# Patient Record
Sex: Female | Born: 1935
Health system: Southern US, Community
[De-identification: ages and names within clinical notes are randomized; demographics above are authoritative.]

## PROBLEM LIST (undated history)

## (undated) DIAGNOSIS — C4492 Squamous cell carcinoma of skin, unspecified: Secondary | ICD-10-CM

## (undated) DIAGNOSIS — E785 Hyperlipidemia, unspecified: Secondary | ICD-10-CM

## (undated) DIAGNOSIS — K219 Gastro-esophageal reflux disease without esophagitis: Secondary | ICD-10-CM

## (undated) DIAGNOSIS — K5010 Crohn's disease of large intestine without complications: Secondary | ICD-10-CM

## (undated) DIAGNOSIS — M1990 Unspecified osteoarthritis, unspecified site: Secondary | ICD-10-CM

## (undated) DIAGNOSIS — I10 Essential (primary) hypertension: Secondary | ICD-10-CM

## (undated) DIAGNOSIS — I779 Disorder of arteries and arterioles, unspecified: Secondary | ICD-10-CM

## (undated) DIAGNOSIS — C4491 Basal cell carcinoma of skin, unspecified: Secondary | ICD-10-CM

## (undated) DIAGNOSIS — F419 Anxiety disorder, unspecified: Secondary | ICD-10-CM

## (undated) DIAGNOSIS — H269 Unspecified cataract: Secondary | ICD-10-CM

## (undated) DIAGNOSIS — F329 Major depressive disorder, single episode, unspecified: Secondary | ICD-10-CM

## (undated) DIAGNOSIS — F32A Depression, unspecified: Secondary | ICD-10-CM

## (undated) DIAGNOSIS — E871 Hypo-osmolality and hyponatremia: Secondary | ICD-10-CM

## (undated) DIAGNOSIS — I639 Cerebral infarction, unspecified: Secondary | ICD-10-CM

## (undated) DIAGNOSIS — I739 Peripheral vascular disease, unspecified: Secondary | ICD-10-CM

## (undated) DIAGNOSIS — Z8673 Personal history of transient ischemic attack (TIA), and cerebral infarction without residual deficits: Secondary | ICD-10-CM

## (undated) HISTORY — DX: Crohn's disease of large intestine without complications: K50.10

## (undated) HISTORY — DX: Personal history of transient ischemic attack (TIA), and cerebral infarction without residual deficits: Z86.73

## (undated) HISTORY — DX: Disorder of arteries and arterioles, unspecified: I77.9

## (undated) HISTORY — PX: TUBAL LIGATION: SHX77

## (undated) HISTORY — PX: LOOP RECORDER IMPLANT: SHX5954

## (undated) HISTORY — DX: Hyperlipidemia, unspecified: E78.5

## (undated) HISTORY — DX: Essential (primary) hypertension: I10

## (undated) HISTORY — DX: Gastro-esophageal reflux disease without esophagitis: K21.9

## (undated) HISTORY — DX: Anxiety disorder, unspecified: F41.9

## (undated) HISTORY — DX: Major depressive disorder, single episode, unspecified: F32.9

## (undated) HISTORY — DX: Cerebral infarction, unspecified: I63.9

## (undated) HISTORY — PX: ABDOMINAL HYSTERECTOMY: SHX81

## (undated) HISTORY — DX: Unspecified osteoarthritis, unspecified site: M19.90

## (undated) HISTORY — DX: Depression, unspecified: F32.A

## (undated) HISTORY — DX: Hypo-osmolality and hyponatremia: E87.1

## (undated) HISTORY — DX: Peripheral vascular disease, unspecified: I73.9

## (undated) HISTORY — DX: Unspecified cataract: H26.9

---
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
## 1982-11-04 HISTORY — PX: VESICOVAGINAL FISTULA CLOSURE W/ TAH: SUR271

## 1989-07-05 HISTORY — PX: COLON RESECTION: SHX5231

## 1992-11-04 HISTORY — PX: BACK SURGERY: SHX140

## 2005-06-11 ENCOUNTER — Other Ambulatory Visit: Admission: RE | Admit: 2005-06-11 | Discharge: 2005-06-11 | Payer: Self-pay | Admitting: Family Medicine

## 2007-04-09 ENCOUNTER — Ambulatory Visit: Payer: Self-pay | Admitting: Cardiovascular Disease

## 2007-04-09 LAB — CONVERTED CEMR LAB
BUN: 9 mg/dL (ref 6–23)
CO2: 28 meq/L (ref 19–32)
Calcium: 9.7 mg/dL (ref 8.4–10.5)
Chloride: 99 meq/L (ref 96–112)
Creatinine, Ser: 0.9 mg/dL (ref 0.4–1.2)
GFR calc Af Amer: 79 mL/min
GFR calc non Af Amer: 66 mL/min
Glucose, Bld: 81 mg/dL (ref 70–99)
Potassium: 4.3 meq/L (ref 3.5–5.1)
Sodium: 136 meq/L (ref 135–145)

## 2007-04-21 ENCOUNTER — Ambulatory Visit: Payer: Self-pay

## 2007-04-24 ENCOUNTER — Ambulatory Visit (HOSPITAL_COMMUNITY): Admission: RE | Admit: 2007-04-24 | Discharge: 2007-04-24 | Payer: Self-pay | Admitting: Cardiovascular Disease

## 2007-04-24 ENCOUNTER — Encounter: Payer: Self-pay | Admitting: Cardiovascular Disease

## 2007-04-24 ENCOUNTER — Ambulatory Visit: Payer: Self-pay

## 2007-04-27 ENCOUNTER — Ambulatory Visit: Payer: Self-pay | Admitting: Cardiovascular Disease

## 2008-01-22 ENCOUNTER — Ambulatory Visit: Payer: Self-pay | Admitting: Cardiovascular Disease

## 2008-04-28 ENCOUNTER — Ambulatory Visit: Payer: Self-pay

## 2008-08-16 ENCOUNTER — Ambulatory Visit: Payer: Self-pay | Admitting: Cardiovascular Disease

## 2009-02-01 DIAGNOSIS — Z794 Long term (current) use of insulin: Secondary | ICD-10-CM | POA: Insufficient documentation

## 2009-02-01 DIAGNOSIS — E119 Type 2 diabetes mellitus without complications: Secondary | ICD-10-CM | POA: Insufficient documentation

## 2009-02-01 DIAGNOSIS — E785 Hyperlipidemia, unspecified: Secondary | ICD-10-CM | POA: Insufficient documentation

## 2009-02-01 DIAGNOSIS — F32A Depression, unspecified: Secondary | ICD-10-CM | POA: Insufficient documentation

## 2009-02-01 DIAGNOSIS — I10 Essential (primary) hypertension: Secondary | ICD-10-CM | POA: Insufficient documentation

## 2009-02-01 DIAGNOSIS — F329 Major depressive disorder, single episode, unspecified: Secondary | ICD-10-CM | POA: Insufficient documentation

## 2009-02-01 DIAGNOSIS — Z8679 Personal history of other diseases of the circulatory system: Secondary | ICD-10-CM | POA: Insufficient documentation

## 2009-02-01 DIAGNOSIS — M1990 Unspecified osteoarthritis, unspecified site: Secondary | ICD-10-CM | POA: Insufficient documentation

## 2009-02-01 DIAGNOSIS — E782 Mixed hyperlipidemia: Secondary | ICD-10-CM | POA: Insufficient documentation

## 2009-02-01 DIAGNOSIS — E1142 Type 2 diabetes mellitus with diabetic polyneuropathy: Secondary | ICD-10-CM | POA: Insufficient documentation

## 2009-02-01 DIAGNOSIS — E1169 Type 2 diabetes mellitus with other specified complication: Secondary | ICD-10-CM | POA: Insufficient documentation

## 2009-02-07 ENCOUNTER — Ambulatory Visit: Payer: Self-pay

## 2009-02-07 ENCOUNTER — Encounter: Payer: Self-pay | Admitting: Cardiovascular Disease

## 2009-02-07 ENCOUNTER — Ambulatory Visit: Payer: Self-pay | Admitting: Cardiovascular Disease

## 2009-02-07 DIAGNOSIS — R42 Dizziness and giddiness: Secondary | ICD-10-CM | POA: Insufficient documentation

## 2009-02-09 LAB — CONVERTED CEMR LAB
Basophils Absolute: 0 10*3/uL (ref 0.0–0.1)
Basophils Relative: 0 % (ref 0.0–3.0)
Eosinophils Absolute: 0.3 10*3/uL (ref 0.0–0.7)
Eosinophils Relative: 2.9 % (ref 0.0–5.0)
HCT: 36.5 % (ref 36.0–46.0)
Hemoglobin: 12.4 g/dL (ref 12.0–15.0)
Lymphocytes Relative: 12 % (ref 12.0–46.0)
Lymphs Abs: 1.1 10*3/uL (ref 0.7–4.0)
MCHC: 34 g/dL (ref 30.0–36.0)
MCV: 88.4 fL (ref 78.0–100.0)
Monocytes Absolute: 0.5 10*3/uL (ref 0.1–1.0)
Monocytes Relative: 5.1 % (ref 3.0–12.0)
Neutro Abs: 7.2 10*3/uL (ref 1.4–7.7)
Neutrophils Relative %: 80 % — ABNORMAL HIGH (ref 43.0–77.0)
Platelets: 175 10*3/uL (ref 150.0–400.0)
RBC: 4.13 M/uL (ref 3.87–5.11)
RDW: 12.2 % (ref 11.5–14.6)
TSH: 1.17 microintl units/mL (ref 0.35–5.50)
WBC: 9.1 10*3/uL (ref 4.5–10.5)

## 2009-05-01 ENCOUNTER — Ambulatory Visit: Payer: Self-pay

## 2009-05-01 ENCOUNTER — Encounter: Payer: Self-pay | Admitting: Cardiovascular Disease

## 2009-09-05 ENCOUNTER — Ambulatory Visit: Payer: Self-pay | Admitting: Cardiovascular Disease

## 2009-09-05 DIAGNOSIS — R079 Chest pain, unspecified: Secondary | ICD-10-CM

## 2009-09-05 DIAGNOSIS — S298XXA Other specified injuries of thorax, initial encounter: Secondary | ICD-10-CM

## 2009-09-11 ENCOUNTER — Telehealth (INDEPENDENT_AMBULATORY_CARE_PROVIDER_SITE_OTHER): Payer: Self-pay

## 2009-09-13 ENCOUNTER — Ambulatory Visit: Payer: Self-pay

## 2009-09-13 ENCOUNTER — Ambulatory Visit: Payer: Self-pay | Admitting: Internal Medicine

## 2009-09-13 ENCOUNTER — Encounter (HOSPITAL_COMMUNITY): Admission: RE | Admit: 2009-09-13 | Discharge: 2009-11-02 | Payer: Self-pay | Admitting: Cardiovascular Disease

## 2010-03-13 ENCOUNTER — Ambulatory Visit: Payer: Self-pay | Admitting: Cardiovascular Disease

## 2010-03-26 DIAGNOSIS — I6930 Unspecified sequelae of cerebral infarction: Secondary | ICD-10-CM

## 2010-03-26 DIAGNOSIS — Z8673 Personal history of transient ischemic attack (TIA), and cerebral infarction without residual deficits: Secondary | ICD-10-CM | POA: Insufficient documentation

## 2010-03-26 HISTORY — DX: Unspecified sequelae of cerebral infarction: I69.30

## 2010-04-30 ENCOUNTER — Encounter: Payer: Self-pay | Admitting: Cardiovascular Disease

## 2010-05-01 ENCOUNTER — Ambulatory Visit: Payer: Self-pay

## 2010-05-01 ENCOUNTER — Encounter: Payer: Self-pay | Admitting: Cardiovascular Disease

## 2010-09-25 ENCOUNTER — Ambulatory Visit: Payer: Self-pay | Admitting: Cardiovascular Disease

## 2010-11-09 ENCOUNTER — Inpatient Hospital Stay (HOSPITAL_COMMUNITY)
Admission: EM | Admit: 2010-11-09 | Discharge: 2010-11-13 | Payer: Self-pay | Source: Home / Self Care | Attending: Internal Medicine | Admitting: Internal Medicine

## 2010-11-10 ENCOUNTER — Encounter (INDEPENDENT_AMBULATORY_CARE_PROVIDER_SITE_OTHER): Payer: Self-pay | Admitting: Internal Medicine

## 2010-11-12 ENCOUNTER — Encounter (INDEPENDENT_AMBULATORY_CARE_PROVIDER_SITE_OTHER): Payer: Self-pay | Admitting: Internal Medicine

## 2010-11-12 ENCOUNTER — Encounter (INDEPENDENT_AMBULATORY_CARE_PROVIDER_SITE_OTHER): Payer: Self-pay | Admitting: Family Medicine

## 2010-11-19 LAB — COMPREHENSIVE METABOLIC PANEL
ALT: 12 U/L (ref 0–35)
AST: 20 U/L (ref 0–37)
Albumin: 3.9 g/dL (ref 3.5–5.2)
Alkaline Phosphatase: 54 U/L (ref 39–117)
BUN: 5 mg/dL — ABNORMAL LOW (ref 6–23)
CO2: 26 mEq/L (ref 19–32)
Calcium: 8.8 mg/dL (ref 8.4–10.5)
Chloride: 103 mEq/L (ref 96–112)
Creatinine, Ser: 0.71 mg/dL (ref 0.4–1.2)
GFR calc Af Amer: >60 mL/min (ref >60)
GFR calc non Af Amer: >60 mL/min (ref >60)
Glucose, Bld: 147 mg/dL — ABNORMAL HIGH (ref 70–99)
Potassium: 3.7 mEq/L (ref 3.5–5.1)
Sodium: 140 mEq/L (ref 135–145)
Total Bilirubin: 0.3 mg/dL (ref 0.3–1.2)
Total Protein: 6.9 g/dL (ref 6.0–8.3)

## 2010-11-19 LAB — GLUCOSE, CAPILLARY
Glucose-Capillary: 142 mg/dL — ABNORMAL HIGH (ref 70–99)
Glucose-Capillary: 150 mg/dL — ABNORMAL HIGH (ref 70–99)
Glucose-Capillary: 147 mg/dL — ABNORMAL HIGH (ref 70–99)
Glucose-Capillary: 146 mg/dL — ABNORMAL HIGH (ref 70–99)
Glucose-Capillary: 160 mg/dL — ABNORMAL HIGH (ref 70–99)
Glucose-Capillary: 170 mg/dL — ABNORMAL HIGH (ref 70–99)
Glucose-Capillary: 309 mg/dL — ABNORMAL HIGH (ref 70–99)
Glucose-Capillary: 217 mg/dL — ABNORMAL HIGH (ref 70–99)
Glucose-Capillary: 162 mg/dL — ABNORMAL HIGH (ref 70–99)
Glucose-Capillary: 154 mg/dL — ABNORMAL HIGH (ref 70–99)
Glucose-Capillary: 120 mg/dL — ABNORMAL HIGH (ref 70–99)
Glucose-Capillary: 209 mg/dL — ABNORMAL HIGH (ref 70–99)
Glucose-Capillary: 152 mg/dL — ABNORMAL HIGH (ref 70–99)
Glucose-Capillary: 125 mg/dL — ABNORMAL HIGH (ref 70–99)
Glucose-Capillary: 232 mg/dL — ABNORMAL HIGH (ref 70–99)
Glucose-Capillary: 153 mg/dL — ABNORMAL HIGH (ref 70–99)

## 2010-11-19 LAB — CARDIAC PANEL(CRET KIN+CKTOT+MB+TROPI)
CK, MB: 1.8 ng/mL (ref 0.3–4.0)
CK, MB: 1.7 ng/mL (ref 0.3–4.0)
Relative Index: INVALID (ref 0.0–2.5)
Relative Index: 1.7 (ref 0.0–2.5)
Total CK: 69 U/L (ref 7–177)
Total CK: 102 U/L (ref 7–177)
Troponin I: 0.02 ng/mL (ref 0.00–0.06)
Troponin I: 0.02 ng/mL (ref 0.00–0.06)

## 2010-11-19 LAB — TSH: TSH: 2.031 u[IU]/mL (ref 0.350–4.500)

## 2010-11-19 LAB — CBC
HCT: 34.6 % — ABNORMAL LOW (ref 36.0–46.0)
Hemoglobin: 11 g/dL — ABNORMAL LOW (ref 12.0–15.0)
MCH: 27.7 pg (ref 26.0–34.0)
MCHC: 31.8 g/dL (ref 30.0–36.0)
MCV: 87.2 fL (ref 78.0–100.0)
Platelets: 202 10*3/uL (ref 150–400)
RBC: 3.97 MIL/uL (ref 3.87–5.11)
RDW: 13.3 % (ref 11.5–15.5)
WBC: 7.2 10*3/uL (ref 4.0–10.5)

## 2010-11-19 LAB — POCT CARDIAC MARKERS
CKMB, poc: 1.3 ng/mL (ref 1.0–8.0)
Myoglobin, poc: 282 ng/mL (ref 12–200)
Troponin i, poc: <0.05 ng/mL (ref 0.00–0.09)

## 2010-11-19 LAB — HEMOGLOBIN A1C
Hgb A1c MFr Bld: 7.4 % — ABNORMAL HIGH (ref <5.7)
Mean Plasma Glucose: 166 mg/dL — ABNORMAL HIGH (ref <117)

## 2010-11-19 LAB — PROTIME-INR
INR: 1.14 (ref 0.00–1.49)
Prothrombin Time: 14.8 seconds (ref 11.6–15.2)

## 2010-11-19 LAB — LIPID PANEL
Cholesterol: 110 mg/dL (ref 0–200)
HDL: 37 mg/dL — ABNORMAL LOW (ref >39)
LDL Cholesterol: 48 mg/dL (ref 0–99)
Total CHOL/HDL Ratio: 3 RATIO
Triglycerides: 127 mg/dL (ref <150)
VLDL: 25 mg/dL (ref 0–40)

## 2010-11-19 LAB — APTT: aPTT: 27 seconds (ref 24–37)

## 2010-11-19 LAB — URINALYSIS, ROUTINE W REFLEX MICROSCOPIC
Bilirubin Urine: NEGATIVE
Hgb urine dipstick: NEGATIVE
Ketones, ur: NEGATIVE mg/dL
Nitrite: NEGATIVE
Protein, ur: NEGATIVE mg/dL
Specific Gravity, Urine: 1.002 — ABNORMAL LOW (ref 1.005–1.030)
Urine Glucose, Fasting: NEGATIVE mg/dL
Urobilinogen, UA: 0.2 mg/dL (ref 0.0–1.0)
pH: 7 (ref 5.0–8.0)

## 2010-11-19 LAB — CK TOTAL AND CKMB (NOT AT ARMC)
CK, MB: 2.3 ng/mL (ref 0.3–4.0)
Relative Index: INVALID (ref 0.0–2.5)
Total CK: 89 U/L (ref 7–177)

## 2010-11-19 LAB — FOLATE: Folate: >20 ng/mL

## 2010-11-19 LAB — TROPONIN I: Troponin I: 0.02 ng/mL (ref 0.00–0.06)

## 2010-11-19 LAB — VITAMIN B12: Vitamin B-12: 133 pg/mL — ABNORMAL LOW (ref 211–911)

## 2010-12-04 NOTE — Miscellaneous (Signed)
Summary: Orders Update  Clinical Lists Changes  Orders: Added new Test order of Carotid Duplex (Carotid Duplex) - Signed 

## 2010-12-04 NOTE — Assessment & Plan Note (Signed)
Summary: 6 month rov   Visit Type:  Follow-up Primary Provider:  Redge Gainer, MD  CC:  edema in ankles.  History of Present Illness: This is a 77 year old woman with history of hypertension, type 2 diabetes, dyslipidemia, and TIA. She presents today for followup evaluation. The patient had a negative Myoview stress test in 2010. She feels generalized fatigue and complains of feeling 'give out.' Denies CP, dyspnea, edema, or palps. Also complains of ankle swelling. No other complaints today.  Current Medications (verified): 1)  One-A-Day Womens  Tabs (Multiple Vitamins-Calcium) .... Sometimes 2)  Metformin Hcl 1000 Mg Tabs (Metformin Hcl) .... Take 1 Tablet By Mouth Two Times A Day 3)  Aggrenox 25-200 Mg Xr12h-Cap (Aspirin-Dipyridamole) .... Take 1 Capsule By Mouth Two Times A Day 4)  Lantus 100 Unit/ml Soln (Insulin Glargine) .... As Directed 5)  Simvastatin 40 Mg Tabs (Simvastatin) .... Take One Tablet By Mouth Daily At Bedtime 6)  Diovan Hct 160-25 Mg Tabs (Valsartan-Hydrochlorothiazide) .... Take 1 Tablet By Mouth Once A Day 7)  Lorazepam 0.5 Mg Tabs (Lorazepam) .... Take One in The Eve. 8)  Metoprolol Succinate 100 Mg Xr24h-Tab (Metoprolol Succinate) .... Take One Tablet By Mouth Daily 9)  Amlodipine Besylate 10 Mg Tabs (Amlodipine Besylate) .... Take One Tablet By Mouth Daily 10)  Nexium 40 Mg Cpdr (Esomeprazole Magnesium) .... Take 1 Capsule By Mouth Once A Day 11)  Omega-3 Fish Oil 1000 Mg Caps (Omega-3 Fatty Acids) .... Take 1 Capsule By Mouth Once A Day 12)  Magnesium 250 Mg Tabs (Magnesium) .... Take 1 Tablet By Mouth Once A Day 13)  Aspirin Ec 325 Mg Tbec (Aspirin) .... As Needed 14)  Vitamin D 400 Unit Tabs (Cholecalciferol) .... Take One Daily 15)  B Complex-B12  Tabs (B Complex Vitamins) .... Take One Daily  Allergies: 1)  ! Codeine 2)  ! Sulfa 3)  ! * Niaspan 4)  ! Morphine 5)  ! * Latex 6)  ! * Shellfish  Past History:  Past medical history reviewed for  relevance to current acute and chronic problems.  Past Medical History: Reviewed history from 02/01/2009 and no changes required. HYPERTENSION (ICD-401.9) TRANSIENT ISCHEMIC ATTACKS, HX OF (ICD-V12.50) DYSLIPIDEMIA (ICD-272.4) CVA (ICD-434.91) DIABETES MELLITUS, TYPE II, HX OF (ICD-V12.2) DEPRESSION (ICD-311) OSTEOARTHRITIS (ICD-715.90)  Review of Systems       Negative except as per HPI   Vital Signs:  Patient profile:   77 year old female Height:      61 inches Weight:      121 pounds Pulse rate:   72 / minute Pulse rhythm:   regular Resp:     16 per minute BP sitting:   148 / 52  (left arm)  Vitals Entered By: Talbert Nan, CMA (Mar 13, 2010 2:22 PM)  Physical Exam  General:  Pt is alert and oriented, in no acute distress. HEENT: normal Neck: normal carotid upstrokes without bruits, JVP normal Lungs: CTA CV: RRR without murmur or gallop Abd: soft, NT, positive BS, no bruit, no organomegaly Ext: no clubbing, cyanosis, or edema. peripheral pulses 2+ and equal Skin: warm and dry without rash    EKG  Procedure date:  03/13/2010  Findings:      NSR, lateral ST abnormality unchanged compared with old tracing, otherwise within normal limits.  Impression & Recommendations:  Problem # 1:  HYPERTENSION (ICD-401.9) BP elevated in this pt with systolic hypertension. Difficult to titrate up her antihypertensives as she already feels dizzy and fatigued. Will continue  current regimen for now with follow-up BP monitoring at home.  Her updated medication list for this problem includes:    Diovan Hct 160-25 Mg Tabs (Valsartan-hydrochlorothiazide) .Marland Kitchen... Take 1 tablet by mouth once a day    Metoprolol Succinate 100 Mg Xr24h-tab (Metoprolol succinate) .Marland Kitchen... Take one tablet by mouth daily    Amlodipine Besylate 10 Mg Tabs (Amlodipine besylate) .Marland Kitchen... Take one tablet by mouth daily    Aspirin Ec 325 Mg Tbec (Aspirin) .Marland Kitchen... As needed  Orders: EKG w/ Interpretation  (93000)  BP today: 148/52 Prior BP: 154/64 (09/05/2009)  Labs Reviewed: K+: 4.3 (04/09/2007) Creat: : 0.9 (04/09/2007)     Problem # 2:  DYSLIPIDEMIA (ICD-272.4) Followed by her primary card physician. No CAD but does have history of TIA. LDL goal is less than 100 mg/dL.  Her updated medication list for this problem includes:    Simvastatin 40 Mg Tabs (Simvastatin) .Marland Kitchen... Take one tablet by mouth daily at bedtime  Problem # 3:  OCCLUSION&STENOSIS CAROTID ARTERY W/INFARCT (ICD-433.11) Pt with 40-59% bilateral carotid stenosis by ultrasound June 2010. Will f/u with a repeat duplex scan at the time of her f/u visit in 6 months.  Her updated medication list for this problem includes:    Aggrenox 25-200 Mg Xr12h-cap (Aspirin-dipyridamole) .Marland Kitchen... Take 1 capsule by mouth two times a day    Aspirin Ec 325 Mg Tbec (Aspirin) .Marland Kitchen... As needed  Patient Instructions: 1)  Your physician wants you to follow-up in:   6 MONTHS. You will receive a reminder letter in the mail two months in advance. If you don't receive a letter, please call our office to schedule the follow-up appointment. 2)  Your physician has requested that you have a carotid duplex in 6  3)  MONTHS. This test is an ultrasound of the carotid arteries in your neck. It looks at blood flow through these arteries that supply the brain with blood. Allow one hour for this exam. There are no restrictions or special instructions. 4)  Your physician recommends that you continue on your current medications as directed. Please refer to the Current Medication list given to you today.

## 2010-12-04 NOTE — Assessment & Plan Note (Signed)
Summary: f61m  Visit Type:  6 months follow up Primary Provider:  DRedge Gainer MD  CC:  Tiredness-weakness.  History of Present Illness: This is a 77year old woman with history of hypertension, type 2 diabetes, dyslipidemia, and TIA. She presents today for followup evaluation. The patient had a negative Myoview stress test in 2010.   This past weekend she complained of right and left arm pain radiating to the shoulders. This awakened her from sleep. She took a 'nerve pill' and went back to sleep, waking up in the morning without problems.   She complains of generalized fatigue, but otherwise has no specific cardiac complaints. Denies exertional chest pain or dyspnea. Occasional lightheadedness without syncope.   Current Medications (verified): 1)  One-A-Day Womens  Tabs (Multiple Vitamins-Calcium) .... Sometimes 2)  Metformin Hcl 1000 Mg Tabs (Metformin Hcl) .... Take 1 Tablet By Mouth Two Times A Day 3)  Aggrenox 25-200 Mg Xr12h-Cap (Aspirin-Dipyridamole) .... Take 1 Capsule By Mouth Two Times A Day 4)  Lantus 100 Unit/ml Soln (Insulin Glargine) .... As Directed 5)  Diovan 320 Mg Tabs (Valsartan) .... Take 1 Tablet By Mouth Once A Day 6)  Lorazepam 0.5 Mg Tabs (Lorazepam) .... Take One in The Eve. 7)  Metoprolol Succinate 100 Mg Xr24h-Tab (Metoprolol Succinate) .... Take One Tablet By Mouth Daily 8)  Amlodipine Besylate 5 Mg Tabs (Amlodipine Besylate) .... Take One Tablet By Mouth Daily 9)  Nexium 40 Mg Cpdr (Esomeprazole Magnesium) .... Take 1 Capsule By Mouth Once A Day 10)  Omega-3 Fish Oil 1000 Mg Caps (Omega-3 Fatty Acids) .... Take 1 Capsule By Mouth Once A Day 11)  Magnesium 250 Mg Tabs (Magnesium) .... Take 1 Tablet By Mouth Once A Day 12)  Aspirin Ec 325 Mg Tbec (Aspirin) .... As Needed 13)  Vitamin D 400 Unit Tabs (Cholecalciferol) .... Take One Daily 14)  B Complex-B12  Tabs (B Complex Vitamins) .... Take One Daily 15)  Lexapro 10 Mg Tabs (Escitalopram Oxalate) .... Take 1  Tablet By Mouth Once A Day  Allergies: 1)  ! Codeine 2)  ! Sulfa 3)  ! * Niaspan 4)  ! Morphine 5)  ! * Latex 6)  ! * Shellfish  Past History:  Past medical history reviewed for relevance to current acute and chronic problems.  Past Medical History: Reviewed history from 02/01/2009 and no changes required. HYPERTENSION (ICD-401.9) TRANSIENT ISCHEMIC ATTACKS, HX OF (ICD-V12.50) DYSLIPIDEMIA (ICD-272.4) CVA (ICD-434.91) DIABETES MELLITUS, TYPE II, HX OF (ICD-V12.2) DEPRESSION (ICD-311) OSTEOARTHRITIS (ICD-715.90)  Review of Systems       Negative except as per HPI   Vital Signs:  Patient profile:   77year old female Height:      61 inches Weight:      122.25 pounds BMI:     23.18 Pulse rate:   65 / minute Pulse rhythm:   regular Resp:     18 per minute BP sitting:   155 / 60  (left arm) Cuff size:   large  Vitals Entered By: LSidney Ace(September 25, 2010 1:41 PM)  Physical Exam  General:  Pt is alert and oriented, elderly woman in no acute distress. HEENT: normal Neck: normal carotid upstrokes without bruits, JVP normal Lungs: CTA CV: RRR without murmur or gallop Abd: soft, NT, positive BS, no bruit, no organomegaly Ext: no clubbing, cyanosis, or edema. peripheral pulses 2+ and equal Skin: warm and dry without rash    EKG  Procedure date:  09/25/2010  Findings:  NSR 65 bpm, nonspecific ST abnormality.  Impression & Recommendations:  Problem # 1:  OCCLUSION&STENOSIS CAROTID ARTERY W/INFARCT (ICD-433.11) Pt is due for a carotid duplex in June. Will see her back at that time for followup. Last study reviewed and showed 40-59% bilateral ICA stenosis.  Her updated medication list for this problem includes:    Aggrenox 25-200 Mg Xr12h-cap (Aspirin-dipyridamole) .Marland Kitchen... Take 1 capsule by mouth two times a day    Aspirin Ec 325 Mg Tbec (Aspirin) .Marland Kitchen... As needed  Problem # 2:  HYPERTENSION (ICD-401.9) Subuptimal control. We reviewed home BP's which  are variable but typically lower than office reading today. I asker her to record BP's for further management. She had edema on higher doses of amlodipine.  Her updated medication list for this problem includes:    Diovan 320 Mg Tabs (Valsartan) .Marland Kitchen... Take 1 tablet by mouth once a day    Metoprolol Succinate 100 Mg Xr24h-tab (Metoprolol succinate) .Marland Kitchen... Take one tablet by mouth daily    Amlodipine Besylate 5 Mg Tabs (Amlodipine besylate) .Marland Kitchen... Take one tablet by mouth daily    Aspirin Ec 325 Mg Tbec (Aspirin) .Marland Kitchen... As needed  BP today: 155/60 Prior BP: 148/52 (03/13/2010)  Labs Reviewed: K+: 4.3 (04/09/2007) Creat: : 0.9 (04/09/2007)     Problem # 3:  DYSLIPIDEMIA (ICD-272.4) Managed by PCP. Goal LDL less than 100 and ideally less than 70.  The following medications were removed from the medication list:    Simvastatin 40 Mg Tabs (Simvastatin) .Marland Kitchen... Take one tablet by mouth daily at bedtime  Other Orders: EKG w/ Interpretation (93000)  Patient Instructions: 1)  Your physician wants you to follow-up in: JUNE 2012.  You will receive a reminder letter in the mail two months in advance. If you don't receive a letter, please call our office to schedule the follow-up appointment. 2)  Your physician has requested that you have a carotid duplex in JUNE 2012. This test is an ultrasound of the carotid arteries in your neck. It looks at blood flow through these arteries that supply the brain with blood. Allow one hour for this exam. There are no restrictions or special instructions. 3)  Your physician recommends that you continue on your current medications as directed. Please refer to the Current Medication list given to you today.

## 2011-03-19 NOTE — Assessment & Plan Note (Signed)
[facility] NOTE   NAME:Kara Dean                       MRN:          [id number]  DATE:01/22/2008                            DOB:          [birth year]    Kara Dean was seen in follow-up at th [facility] on  January 22, 2008.  Kara Dean is a delightful 77 year old woman with a  history of hypertension, diabetes, dyslipidemia, and TIA, who presents  today for followup.  She was initially seen last summer with chest pain  and underwent a dobutamine Myoview stress scan that was normal.  She  also had an echocardiogram that showed normal findings at that time.  She has done well over the past year, but approximately 2 weeks ago  experienced discomfort in her shoulders and arms that she describes as  an ache.  The symptoms lasted over a few days and were intermittent in  nature.  There were not related to exertion.  She was evaluated at  [facility] and had an EKG there that showed  nonspecific findings.  Her symptoms have subsequently resolved, and in  retrospect she attributes them to a flu bug.  However, she really did  not have much in the sense of systemic symptoms at the time of her arm  and shoulder discomfort.  Currently, she feels well and has no dyspnea,  orthopnea, PND, edema, or chest pain.   MEDICATIONS:  1. Prevacid 30 mg daily.  2. Once-a-day women's vitamin.  3. Diovan/HCT 320/12.5 mg daily.  4. Metformin 1 g twice daily.  5. Aggrenox 25/200 mg b.i.d.  6. Toprol XL 100 mg daily.  7. Lantus daily.  8. Norvasc 5 mg daily.  9. Simvastatin 40 mg at bedtime.  10.Glucotrol XL 5 mg daily.  11.Nasacort   ALLERGIES:  1. CODEINE.  2. SULFA.  3. NIASPAN.  4. MORPHINE.  5. LATEX.  6. SHELLFISH   PHYSICAL EXAMINATION:  GENERAL:  She is alert and oriented, in no  distress.  HEENT:  Normal.  NECK:  Normal carotid upstrokes, without bruits.  Jugular  venous  pressure is normal.  No thyromegaly or thyroid nodules.  LUNGS:  Clear bilaterally.  HEART:  Regular rate and rhythm, without murmurs or gallops.  ABDOMEN:  Soft, nontender.  No bruits.  EXTREMITIES:  No clubbing, cyanosis, or edema.  Peripheral pulses 2+ and  equal throughout.   EKG shows sinus bradycardia, with a nonspecific ST-segment change.   ASSESSMENT:  Chest pain.  While Kara Dean' symptoms have resolved, I  am concerned about at the fact that they may represent an anginal  equivalent.  She certainly has a risk profile that predisposes her to  CAD.  We discussed my recommendation to proceed with a cardiac  catheterization, but she is quite reluctant to do this, especially now  that she is symptom free over the last week.  I asked her to really keep  a close eye symptoms, and if she has any recurrent pain to seek  immediate medical attention.  If her symptoms are similar  to what she  has had a few weeks back, she could simply call our office and we would  arrange a prompt evaluation and cardiac catheterization.  In the  meantime, she will continue on her current medical therapy.  She is on a  good dose of beta blocker, and her heart rate precludes escalation of  her Toprol.  She has a component of white coat hypertension, and she  reports better blood pressure control at home, so I did not make any  other changes in her regimen.  She is on multiple antihypertensives  under the care of [facility].  She remains on Aggrenox for antiplatelet  therapy with her history of TIA.  I will plan on seeing Kara Dean back  in 3 months, and again I would be happy to see her sooner if she has  recurrence of her symptoms.     Kara Bond. Burt Knack, MD  Electronically Signed    MDC/MedQ  DD: 01/26/2008  DT: 01/26/2008  Job #: [id number]   cc:   Chipper Herb, M.D.

## 2011-03-19 NOTE — Letter (Signed)
April 09, 2007    Kara Eon, NP & Dr. Redge Gainer  [address], [place] [address]   RE:  Kara Dean, [facility]  MRN:  [id number]  /  DOB:  1933/04/21   Dear Butch Penny and Dr. Laurance Flatten:   It was my pleasure to see Kara Dean as an outpatient at the  [facility] Office on April 09, 2007. As you know, she is a 77-year-  old woman who presents with a chief complaint of chest pain.   HISTORY OF PRESENT ILLNESS:  Kara Dean was recently evaluated after an  episode of transient neurologic dysfunction. Approximately three weeks  ago, she had a 30-60 minute episode of aphasia and difficulty  remembering the names of her children, as well as difficulty with all  thought processes in general. This episode resolved spontaneously and  she did not have any workup at the time until she presented to your  office a few weeks later. She describes a similar episode that occurred  approximately five years ago, but has had no episodes since, nor has she  had any similar episodes in the past year. She did not have any focal  weakness or other focal neurologic signs at the time. She was presumed  to have had a TIA and was started on Aggrenox.   Her main complaint today is that of intermittent chest pain. She  describes the pain in the substernal chest area, as well as the  epigastrium that occurs both at rest and with exertion, but is more  pronounced with exertion. She has had these symptoms for the past few  months. The pain is non-radiating and feels like a pressure. She also  describes dyspnea on exertion when walking up hills, but this is a  stable symptom over several years. There are no other exacerbating  factors for her chest pain. She is not taking any medication or found  any alleviating factors other than stopping her activity. She has had  light-headed spells and episodic palpitations, but no other  cardiovascular complaints. She has not had syncope.   PAST MEDICAL HISTORY:   Is pertinent for the following:  1. Hypertension with suboptimal control.  2. Type-2 diabetes treated with oral hypoglycemics.  3. Dyslipidemia.  4. Back injury requiring surgery in 1994.  5. Hysterectomy in 1984.  6. Tubal ligation.  7. Osteoarthritis with pain in the back, knees, and feet.  8. Depression.   CURRENT MEDICATIONS:  1. Prevacid 30 mg daily.  2. Diovan/hydrochlorothiazide 320/12.5 mg daily.  3. Aspirin 81 mg daily.  4. Lorazepam 0.5 mg as needed.  5. Avandaryl 4/4 mg daily.  6. Simvastatin 40 mg daily.  7. Metformin 1000 mg twice daily.  8. Aggrenox 25/200 mg twice daily.  9. Toprol XL 100 mg daily.   ALLERGIES:  CODEINE, SULFA, NIASPAN, MORPHINE, LATEX, and SHELLFISH.   FAMILY HISTORY:  She has a brother who was diagnosed with coronary  artery disease at age 67 and a sister who was diagnosed at age 98. Other  family members include her mother who died of old age at 34, and her  father died of emphysema at 6.   SOCIAL HISTORY:  The patient is retired. She is widowed and has six  children. She does not smoke cigarettes, nor does she drink alcohol. She  works in her yard, but does not do any regular exercise.   REVIEW OF SYSTEMS:  A complete 12 point review of systems was performed,  and pertinent positives included calf pain with walking, dizziness,  light-headedness, seasonal allergies, fatigue, arthritis, anxiety, and  depression. All other systems are reviewed and are negative except as  detailed above.   PHYSICAL EXAMINATION:  GENERAL:  The patient is alert and oriented. She  is no acute distress. She is well developed and well nourished.  VITAL SIGNS:  Blood pressure 170/70 in the right arm, 170/74 in the left  arm, heart rate 65, respiratory rate 16.  HEENT:  Normal.  NECK:  Normal carotid upstrokes without bruits. Jugular venous pressure  is normal. There is no thyromegaly or thyroid nodules.  LUNGS:  Clear to auscultation bilaterally.   CARDIOVASCULAR:  The apex is discreet and nondisplaced. The heart is a  regular rate and rhythm without murmurs or gallops.  ABDOMEN:  Soft and nontender, no organomegaly, no abdominal bruits, no  masses.  BACK:  No flank tenderness.  EXTREMITIES:  No cyanosis, clubbing, or edema. Peripheral pulses are 2+  and equal throughout.  SKIN:  Warm and dry without rash.  NEUROLOGIC:  Cranial nerves II-XII are intact. Strength is 5/5 and equal  in the arms and legs bilaterally. The patient is alert and oriented to  all spheres. Cerebellar function test with finger to nose are normal.  Gait is normal.  LYMPHATICS:  No adenopathy.   EKG demonstrates normal sinus rhythm and is within normal limits.   ASSESSMENT:  Kara Dean is a 77 year old woman with the following  cardiovascular issues:  1. Chest pain. She has both typical and atypical features. She has      multiple risk factors including hypertension, type-2 diabetes, and      dyslipidemia. While she has a normal EKG and has pain that occurs      both with exertion and rest, I think she requires a stress Myoview      study. We will schedule this to rule out significant ischemic heart      disease. We will continue her current medications for now and we      will not change her antiplatelet regimen as she is on Aggrenox      which is appropriate in the setting of her recent TIA.  2. Recent neurologic event. Her event sounds very typical for a TIA.      She does not have any clear cut source, but certainly is at risk      for atheroembolic disease with her multiple risk factors. We will      check a 2D echocardiogram to rule out a cardiac source of embolus,      also she requires a carotid ultrasound to rule out significant      carotid stenosis, even though I do not hear a bruit on exam. Also,      we will check a CT of the brain to help elucidate whether she has     any chronic findings or subacute infarcts. Fortunately, she does      not  have any residual neurologic defects. She may require a formal      neurology consultation as well.  3. Hypertension with suboptimal control. I did not make any changes to      her medical regimen today, but she likely will require upward      titration of her medical therapy or for her hypertension.  4. Dyslipidemia. She is on Simvastatin 40 mg. Her goal LDL should      clearly be less than 100 and ideally  less than 70 in the setting of      her likely cerebral vascular disease.   For follow up, I would like to see Kara Dean back soon after her  studies are completed, and will review the need for any further studies  at that point.   Thanks again for allowing me to see Kara Dean who is a delightful  woman. Please feel free to call at anytime with questions regarding her  care.    Sincerely,      Juanda Bond. Burt Knack, MD  Electronically Signed    MDC/MedQ  DD: 04/09/2007  DT: 04/10/2007  Job #: [id number]

## 2011-03-19 NOTE — Assessment & Plan Note (Signed)
[place] OFFICE NOTE   NAME:Mcvicar, VINCIE LINN                       MRN:          [id number]  DATE:04/27/2007                            DOB:          1933-05-25    Ridhi Ronie Fleeger was seen in followup at the [facility] Office  on April 27, 2007. She is a delightful 77 year old woman who I initially  saw on June 5th in consultation for chest pain. Ms. Eckstein had a recent  TIA and has been started on Aggrenox. She also had chest pains in the  setting of multiple cardiac risk factors. I ordered several studies to  evaluate her chest pain, as well as her cerebral vascular disease.   From a symptomatic standpoint she denies any further neurologic events.  She really has been doing well without problems. Her chest pain has also  resolved. She denies dyspnea, orthopnea, PND. She has had some  intermittent palpitations but overall is doing quite well at this time.   CURRENT MEDICATIONS:  Include;  1. Prevacid 30 mg daily.  2. Multivitamin daily.  3. Diovan/hydrochlorothiazide 320/12.5 mg daily.  4. Aspirin 81 mg daily.  5. Lorazepam 0.5 mg as needed.  6. Avandaryl 4/4 mg daily.  7. Simvastatin 40 mg daily.  8. Metformin 1000 mg twice daily.  9. Aggrenox 25/200 mg twice daily.  10.Toprol XL 100 mg daily.  11.Lantus insulin daily.   ALLERGIES:  MULTIPLE AND INCLUDE CODEINE, SULFUR, NIASPAN, MORPHINE,  LATEX, AND SHELL FISH.   PHYSICAL EXAMINATION:  The patient is alert and oriented. She is in no  acute distress. Her weight is 130 pounds. Blood pressure is 190/80,  heart rate is 54, respiratory rate 16.  HEENT: Normal.  NECK: Normal carotid upstrokes without bruits. Jugular venosus pressure  is normal.  LUNGS: Clear to auscultation bilaterally.  CARDIOVASCULAR: The apex is discrete and nondisplaced.  HEART: Regular rate and rhythm without murmurs or gallops.  ABDOMEN: Soft, nontender. No  organomegaly.  EXTREMITIES: No clubbing, cyanosis, or edema. Peripheral pulses are 2+  and equal throughout.   STUDIES REVIEWED:  Dobutamine Myoview stress test was unremarkable with  an LVEF of 69% there was normal contractility and thickening in all  areas of the myocardium. The study was interpreted as low risk with  normal perfusion.   Transthoracic echocardiogram to evaluate for cardiac source of embolus  was also unremarkable. The overall LVEF by echo was 55% to 65%. There  were no regional wall motion abnormalities. There was mild mitral  regurgitation, PA pressure was mildly increased. There was no  significant valvular disease noted.   Carotid ultrasound demonstrated mild heterogenous plaque bilaterally  with 40% to 59% bilaterally internal carotid artery stenosis.   CT scan of the brain showed no acute intracranial findings. There was  chronic microvascular white matter disease.   ASSESSMENT:  Ms. Volland is currently stable from a cardiovascular and  cerebral vascular standpoint. Her chest pain syndrome has resolved. Her  Myoview stress study is reassuring. I do not think she requires any  further evaluation at this point.  Regarding her cerebral vascular she does have moderate bilaterally  carotid stenosis and will require yearly follow up with carotid Duplex  scanning. There was no obvious cardioembolic source of her event based  on a surface echocardiogram. She clearly has significant hypertension  and this is the likely etiology of her symptoms. I have taken the  liberty of starting her on Norvasc 5 mg daily for additive  antihypertensive therapy.   I appreciate the opportunity to participate in the care of Ms. Chrissie Noa.  I did not schedule a regular follow up today as she will continue to  follow up closely with Dr. Laurance Flatten and Olivia Mackie. I would be happy to  see her at any time in the future if any cardiovascular problems arise.     Juanda Bond. Burt Knack, MD   Electronically Signed    MDC/MedQ  DD: 04/27/2007  DT: 04/28/2007  Job #: [id number]   cc:   Chipper Herb, M.D.  Olivia Mackie

## 2011-03-19 NOTE — Assessment & Plan Note (Signed)
[place] OFFICE NOTE   NAME:Lapier, JOANY KHATIB                       MRN:          [id number]  DATE:08/16/2008                            DOB:          November 24, 1932    Kara Dean returns for followup at the [facility] office on  August 16, 2008.  She is a 77 year old woman with hypertension,  diabetes, dyslipidemia, and history of TIA.  She has been evaluated here  for chest pain.  She has undergone Myoview stress imaging that was  normal.  Since her last visit here in March, she has had no further  episodes of chest discomfort.  She denies dyspnea, edema, orthopnea, or  PND.  She is not engaged in regular exercise, but she denies some  exertional symptoms with her normal level of activity.  She has a past  history of palpitations, but this has not been a problem of late.   MEDICATIONS:  1. Prevacid 30 mg daily.  2. Multivitamin 1 daily.  3. Metformin 1 g twice daily.  4. Aggrenox 25/200 mg twice daily.  5. Toprol-XL 100 mg daily.  6. Lantus daily.  7. Norvasc 5 mg daily.  8. Simvastatin 40 mg at bedtime.  9. Nasacort 1 daily.  10.Diovan HCT 160/25 mg daily.   ALLERGIES:  Codeine, sulfa, Niaspan, morphine, latex, and shellfish.   PHYSICAL EXAMINATION:  GENERAL:  The patient is alert and oriented  elderly woman no acute distress.  VITAL SIGNS:  Weight is 121 pounds, blood pressure 180/78, heart rate  55, and respiratory rate 18.  HEENT:  Normal.  NECK:  Normal carotid upstrokes.  There are no carotid bruits.  JVP is  normal.  LUNGS:  Clear bilaterally.  HEART:  Regular rate and rhythm without murmurs or gallops.  ABDOMEN:  Soft, nontender.  No organomegaly.  EXTREMITIES:  No clubbing, cyanosis or edema.  Peripheral pulses are  intact and equal.   ASSESSMENT:  1. Essential hypertension with suboptimal control.  We should make      aggressive efforts at lowering her blood pressure in the  setting of      prior transient ischemic attack.  I have asked her to increase her      Norvasc to 10 mg daily.  She was resistant to starting a new      medication since she is already on 4 antihypertensive medicines.  I      would like to see her back in the office in 6 months.  I have asked      her to monitor her blood pressure at home and also followup with      Dr. Tawanna Sat office.  2. History of chest pain.  Myoview stress scan in 2008 was negative      for ischemia.  No further symptoms.  3. History of transient ischemic attack.  The patient remains on      Aggrenox.  She has had no further symptoms.   For followup, I would like to see the patient back in 6 months as above.  Juanda Bond. Burt Knack, MD  Electronically Signed    MDC/MedQ  DD: 08/16/2008  DT: 08/17/2008  Job #: [id number]   cc:   Chipper Herb, M.D.

## 2011-04-05 ENCOUNTER — Encounter: Payer: Self-pay | Admitting: Cardiovascular Disease

## 2011-04-22 ENCOUNTER — Ambulatory Visit (INDEPENDENT_AMBULATORY_CARE_PROVIDER_SITE_OTHER): Payer: PRIVATE HEALTH INSURANCE | Admitting: Cardiovascular Disease

## 2011-04-22 ENCOUNTER — Encounter: Payer: Self-pay | Admitting: Cardiovascular Disease

## 2011-04-22 VITALS — BP 138/80 | HR 64 | Ht 60.0 in | Wt 123.0 lb

## 2011-04-22 DIAGNOSIS — R079 Chest pain, unspecified: Secondary | ICD-10-CM

## 2011-04-22 DIAGNOSIS — I63239 Cerebral infarction due to unspecified occlusion or stenosis of unspecified carotid arteries: Secondary | ICD-10-CM

## 2011-04-22 DIAGNOSIS — I10 Essential (primary) hypertension: Secondary | ICD-10-CM

## 2011-04-22 NOTE — Assessment & Plan Note (Signed)
The patient has moderate carotid stenosis with no recent stroke or TIA symptoms. She is due for a followup carotid duplex next month.

## 2011-04-22 NOTE — Progress Notes (Signed)
HPI:  This is a 77 year old woman presenting for followup evaluation. She has a history of hypertension, type 2 diabetes, dyslipidemia, and TIA. The patient had an episode of chest pain about 2 weeks ago that she really is unable to characterize very well. The pain did not occur with exertion. It only lasted for a few minutes but she did intermittently has a difficult time remembering how long her symptoms lasted. She has had no problems since then. She had an episode of back and shoulder pain about 6 months ago but other than that episode and her recent symptoms she had no interval problems. She is inactive. She denies any stroke or TIA symptoms. She denies dyspnea, edema, outpatient, lightheadedness, or syncope.  Outpatient Encounter Prescriptions as of 04/22/2011  Medication Sig Dispense Refill  . amLODipine (NORVASC) 5 MG tablet Take 5 mg by mouth daily.        Marland Kitchen aspirin 325 MG EC tablet Take 325 mg by mouth as needed.        Marland Kitchen atorvastatin (LIPITOR) 10 MG tablet Take 10 mg by mouth daily.        Marland Kitchen dipyridamole-aspirin (AGGRENOX) 25-200 MG per 12 hr capsule Take 1 capsule by mouth 2 (two) times daily.        Marland Kitchen escitalopram (LEXAPRO) 10 MG tablet Take 10 mg by mouth daily.        Marland Kitchen esomeprazole (NEXIUM) 40 MG capsule Take 40 mg by mouth daily before breakfast.        . ferrous fumarate (FERRETTS) 325 (106 FE) MG TABS Take by mouth.        . insulin glargine (LANTUS) 100 UNIT/ML injection Inject into the skin as directed.        Marland Kitchen LORazepam (ATIVAN) 0.5 MG tablet Take 0.5 mg by mouth every evening.        . Magnesium 250 MG TABS Take 1 tablet by mouth daily.        . metFORMIN (GLUCOPHAGE) 1000 MG tablet Take 1,000 mg by mouth 2 (two) times daily.        . metoprolol (TOPROL-XL) 100 MG 24 hr tablet Take 100 mg by mouth daily.        Marland Kitchen omega-3 fish oil (MAXEPA) 1000 MG CAPS capsule Take 1 capsule by mouth daily.        . valsartan (DIOVAN) 320 MG tablet Take 320 mg by mouth daily.        . vitamin  D, CHOLECALCIFEROL, 400 UNITS tablet Take 400 Units by mouth daily.        . B Complex Vitamins (B COMPLEX-B12) TABS Take 1 tablet by mouth daily.          Allergies  Allergen Reactions  . Codeine   . Latex   . Morphine   . Niacin   . Sulfonamide Derivatives     Past Medical History  Diagnosis Date  . Hypertension   . History of TIAs   . Dyslipidemia   . CVA (cerebral infarction)   . Diabetes mellitus     type 2  . Depression   . Osteoarthritis     ROS: Negative except as per HPI  BP 138/80  Pulse 64  Ht 5' (1.524 m)  Wt 123 lb (55.792 kg)  BMI 24.02 kg/m2  PHYSICAL EXAM: Pt is alert and oriented, elderly woman in NAD HEENT: normal Neck: JVP - normal, carotids 2+= with soft bilateral bruits Lungs: CTA bilaterally CV: RRR without murmur or gallop Abd:  soft, NT, Positive BS, no hepatomegaly Ext: no C/C/E, distal pulses intact and equal Skin: warm/dry no rash  EKG:  Normal sinus rhythm 65 beats per minute, inferolateral ST segment abnormality unchanged from previous tracing  ASSESSMENT AND PLAN:

## 2011-04-22 NOTE — Patient Instructions (Signed)
Your physician wants you to follow-up in: 6 MONTHS.  You will receive a reminder letter in the mail two months in advance. If you don't receive a letter, please call our office to schedule the follow-up appointment.  Your physician recommends that you continue on your current medications as directed. Please refer to the Current Medication list given to you today.

## 2011-04-22 NOTE — Assessment & Plan Note (Signed)
The patient has had a single episode of chest pain, with features difficult to determine whether they are typical or atypical for angina. She had a negative Myoview stress scan about 18 months ago. Have asked her to continue observing her symptoms and if they recur we can proceed with further evaluation. Otherwise recommend continued medical therapy as she is doing. The patient's EKG is unchanged. She is on antiplatelet therapy with aspirin, lipid-lowering with atorvastatin and remains on good program of risk reduction with her multiple medications.

## 2011-04-22 NOTE — Assessment & Plan Note (Signed)
The Blood pressure is well-controlled on current medical regimen.

## 2011-05-03 ENCOUNTER — Other Ambulatory Visit: Payer: Self-pay | Admitting: Cardiovascular Disease

## 2011-05-03 DIAGNOSIS — I6529 Occlusion and stenosis of unspecified carotid artery: Secondary | ICD-10-CM

## 2011-05-07 ENCOUNTER — Encounter (INDEPENDENT_AMBULATORY_CARE_PROVIDER_SITE_OTHER): Payer: Medicare Other | Admitting: *Deleted

## 2011-05-07 DIAGNOSIS — I6529 Occlusion and stenosis of unspecified carotid artery: Secondary | ICD-10-CM

## 2011-05-13 ENCOUNTER — Encounter: Payer: Self-pay | Admitting: Cardiovascular Disease

## 2011-05-14 ENCOUNTER — Other Ambulatory Visit: Payer: Self-pay | Admitting: Cardiovascular Disease

## 2011-05-15 ENCOUNTER — Telehealth: Payer: Self-pay | Admitting: *Deleted

## 2011-05-15 NOTE — Telephone Encounter (Signed)
Daughter given results of carotid doppler.

## 2011-10-24 ENCOUNTER — Encounter: Payer: Self-pay | Admitting: Cardiovascular Disease

## 2011-10-24 ENCOUNTER — Ambulatory Visit (INDEPENDENT_AMBULATORY_CARE_PROVIDER_SITE_OTHER): Payer: Medicaid Other | Admitting: Cardiovascular Disease

## 2011-10-24 DIAGNOSIS — E785 Hyperlipidemia, unspecified: Secondary | ICD-10-CM

## 2011-10-24 DIAGNOSIS — R079 Chest pain, unspecified: Secondary | ICD-10-CM

## 2011-10-24 DIAGNOSIS — I10 Essential (primary) hypertension: Secondary | ICD-10-CM

## 2011-10-24 MED ORDER — ASPIRIN EC 81 MG PO TBEC: 81.0000 mg | DELAYED_RELEASE_TABLET | Freq: Every day | ORAL | Status: AC

## 2011-10-24 MED ORDER — HYDROCHLOROTHIAZIDE 25 MG PO TABS: 25.0000 mg | ORAL_TABLET | Freq: Every day | ORAL | Status: DC

## 2011-10-24 NOTE — Assessment & Plan Note (Signed)
Lipids are followed by her primary care provider. The patient is on atorvastatin. Goal LDL is less than 100 mg per deciliter.

## 2011-10-24 NOTE — Patient Instructions (Addendum)
Your physician wants you to follow-up in: July 2013. You will receive a reminder letter in the mail two months in advance. If you don't receive a letter, please call our office to schedule the follow-up appointment.  Your physician has requested that you have a carotid duplex in July 2013. This test is an ultrasound of the carotid arteries in your neck. It looks at blood flow through these arteries that supply the brain with blood. Allow one hour for this exam. There are no restrictions or special instructions.  Your physician has recommended you make the following change in your medication: START HCTZ 92m once a day  Your physician recommends that you return for lab work in: 2 WEEKS (BMP 401.1, 786.5)---[facility]

## 2011-10-24 NOTE — Assessment & Plan Note (Signed)
Blood pressure control is suboptimal. Recommend addition of hydrochlorothiazide 25 mg daily with a metabolic panel to followup in 2 weeks. She will continue on amlodipine, metoprolol succinate, and valsartan.

## 2011-10-24 NOTE — Progress Notes (Signed)
HPI:  This is a 77 year old woman presenting for followup evaluation. She has a history of TIA, hypertension, diabetes, and dyslipidemia. She has had chronic chest pain with stress and underwent a nuclear scan in November 2010 showing normal homogenous uptake in all areas of the myocardium with no significant ischemia and a left ventricular ejection fraction of 74%.  She has been through a lot of family stress over the past several months. She continues to have occasional chest pains with emotional stress. She has no symptoms with exertion. There is no change in the pattern of her pain characteristic or frequency/intensity. The patient's chest pain is characterized as an ache across the upper chest and it resolves spontaneously. She has never required nitroglycerin.  She has mild chronic dyspnea. She denies any recent stroke or TIA symptoms. She denies leg swelling. She did have lower extremity edema with higher doses of amlodipine.  Outpatient Encounter Prescriptions as of 10/24/2011  Medication Sig Dispense Refill  . atorvastatin (LIPITOR) 10 MG tablet Take 10 mg by mouth daily.        . B Complex Vitamins (B COMPLEX-B12) TABS Take 1 tablet by mouth daily.        Marland Kitchen dipyridamole-aspirin (AGGRENOX) 25-200 MG per 12 hr capsule Take 1 capsule by mouth 2 (two) times daily.        Marland Kitchen esomeprazole (NEXIUM) 40 MG capsule Take 40 mg by mouth daily before breakfast.        . ferrous fumarate (FERRETTS) 325 (106 FE) MG TABS Take by mouth.        . insulin glargine (LANTUS) 100 UNIT/ML injection Inject 20 Units into the skin at bedtime.       Marland Kitchen LORazepam (ATIVAN) 0.5 MG tablet Take 0.5 mg by mouth every evening.        . Magnesium 250 MG TABS Take 1 tablet by mouth daily.        . metFORMIN (GLUCOPHAGE) 1000 MG tablet Take 1,000 mg by mouth 2 (two) times daily.        . metoprolol (TOPROL-XL) 100 MG 24 hr tablet Take 100 mg by mouth daily.        . NORVASC 5 MG tablet TAKE 1 TABLET DAILY  90 each  3  . omega-3  fish oil (MAXEPA) 1000 MG CAPS capsule Take 1 capsule by mouth daily.        . valsartan (DIOVAN) 320 MG tablet Take 320 mg by mouth daily.        . vitamin D, CHOLECALCIFEROL, 400 UNITS tablet Take 400 Units by mouth daily.        . hydrochlorothiazide (HYDRODIURIL) 25 MG tablet Take 1 tablet (25 mg total) by mouth daily.  30 tablet  11  . DISCONTD: aspirin 325 MG EC tablet Take 325 mg by mouth as needed.        Marland Kitchen DISCONTD: escitalopram (LEXAPRO) 10 MG tablet Take 10 mg by mouth daily.          Allergies  Allergen Reactions  . Codeine   . Latex   . Morphine   . Niacin   . Sulfonamide Derivatives     Past Medical History  Diagnosis Date  . Hypertension   . History of TIAs   . Dyslipidemia   . CVA (cerebral infarction)   . Diabetes mellitus     type 2  . Depression   . Osteoarthritis     ROS: Negative except as per HPI  BP 150/78  Pulse  64  Ht 5' 1"  (1.549 m)  Wt 56.3 kg (124 lb 1.9 oz)  BMI 23.45 kg/m2  PHYSICAL EXAM: Pt is alert and oriented, NAD HEENT: normal Neck: JVP - normal, carotids 2+= with soft bilateral bruits Lungs: CTA bilaterally CV: RRR without murmur or gallop Abd: soft, NT, Positive BS, no hepatomegaly Ext: no C/C/E, distal pulses intact and equal Skin: warm/dry no rash  EKG:  Normal sinus rhythm 64 beats per minute, occasional PVC, nonspecific ST abnormality unchanged from prior tracings  ASSESSMENT AND PLAN:

## 2011-10-24 NOTE — Assessment & Plan Note (Signed)
No change in overall pattern of chest pain. Negative Myoview in 2010 with results reviewed. Recommend ongoing medical therapy.

## 2012-06-17 ENCOUNTER — Other Ambulatory Visit: Payer: Self-pay | Admitting: Cardiovascular Disease

## 2012-08-25 ENCOUNTER — Encounter (INDEPENDENT_AMBULATORY_CARE_PROVIDER_SITE_OTHER): Payer: PRIVATE HEALTH INSURANCE

## 2012-08-25 DIAGNOSIS — I6529 Occlusion and stenosis of unspecified carotid artery: Secondary | ICD-10-CM

## 2012-08-25 DIAGNOSIS — I10 Essential (primary) hypertension: Secondary | ICD-10-CM

## 2012-08-25 DIAGNOSIS — R079 Chest pain, unspecified: Secondary | ICD-10-CM

## 2012-10-13 ENCOUNTER — Other Ambulatory Visit: Payer: Self-pay | Admitting: Cardiovascular Disease

## 2013-01-29 ENCOUNTER — Other Ambulatory Visit: Payer: Self-pay

## 2013-02-03 ENCOUNTER — Encounter: Payer: Self-pay | Admitting: Cardiovascular Disease

## 2013-02-03 ENCOUNTER — Ambulatory Visit (INDEPENDENT_AMBULATORY_CARE_PROVIDER_SITE_OTHER): Payer: PRIVATE HEALTH INSURANCE | Admitting: Cardiovascular Disease

## 2013-02-03 VITALS — BP 164/70 | HR 60 | Ht 60.0 in | Wt 132.0 lb

## 2013-02-03 DIAGNOSIS — I10 Essential (primary) hypertension: Secondary | ICD-10-CM

## 2013-02-03 DIAGNOSIS — R079 Chest pain, unspecified: Secondary | ICD-10-CM

## 2013-02-03 NOTE — Progress Notes (Signed)
HPI:  79 year old woman presenting for followup evaluation. The patient has a history of TIA, hypertension, diabetes, and dyslipidemia. She had a stress nuclear scan in 2000 and showing normal perfusion with a left ventricular ejection fraction of 74%.  The patient has done reasonably well until last week when she developed an episode of severe substernal chest discomfort. This occurred with emotional stress. She had undergone some family issues with her daughter and developed an ache across the chest. There was no radiation to the neck or arm. There was associated shortness of breath. She took aspirin and went to bed.  She denies lightheadedness or weakness. She denies palpitations. She notes that her blood pressure has been elevated lately.  Outpatient Encounter Prescriptions as of 02/03/2013  Medication Sig Dispense Refill  . amLODipine (NORVASC) 5 MG tablet TAKE 1 TABLET DAILY  90 tablet  1  . atorvastatin (LIPITOR) 10 MG tablet Take 10 mg by mouth daily.        . B Complex Vitamins (B COMPLEX-B12) TABS Take 1 tablet by mouth daily.        Marland Kitchen dipyridamole-aspirin (AGGRENOX) 25-200 MG per 12 hr capsule Take 1 capsule by mouth 2 (two) times daily.        Marland Kitchen esomeprazole (NEXIUM) 40 MG capsule Take 40 mg by mouth daily before breakfast.        . ferrous fumarate (FERRETTS) 325 (106 FE) MG TABS Take by mouth.        Marland Kitchen glipiZIDE (GLUCOTROL XL) 10 MG 24 hr tablet Take 10 mg by mouth daily.      . insulin glargine (LANTUS) 100 UNIT/ML injection Inject 20 Units into the skin at bedtime.       Marland Kitchen LORazepam (ATIVAN) 0.5 MG tablet Take 0.5 mg by mouth every evening.        . Magnesium 250 MG TABS Take 1 tablet by mouth daily.        . metoprolol (TOPROL-XL) 100 MG 24 hr tablet Take 100 mg by mouth daily.        Marland Kitchen omega-3 fish oil (MAXEPA) 1000 MG CAPS capsule Take 1 capsule by mouth daily.        . valsartan (DIOVAN) 320 MG tablet Take 320 mg by mouth daily.        . vitamin D, CHOLECALCIFEROL, 400  UNITS tablet Take 400 Units by mouth daily.        . [DISCONTINUED] hydrochlorothiazide (HYDRODIURIL) 25 MG tablet Take 1 tablet (25 mg total) by mouth daily.  30 tablet  11  . [DISCONTINUED] metFORMIN (GLUCOPHAGE) 1000 MG tablet Take 1,000 mg by mouth 2 (two) times daily.         No facility-administered encounter medications on file as of 02/03/2013.    Allergies  Allergen Reactions  . Codeine   . Latex   . Morphine   . Niacin   . Sulfonamide Derivatives     Past Medical History  Diagnosis Date  . Hypertension   . History of TIAs   . Dyslipidemia   . CVA (cerebral infarction)   . Diabetes mellitus     type 2  . Depression   . Osteoarthritis     ROS: Negative except as per HPI  BP 164/70  Ht 5' (1.524 m)  Wt 132 lb (59.875 kg)  BMI 25.78 kg/m2  PHYSICAL EXAM: Pt is alert and oriented, pleasant elderly woman in NAD HEENT: normal Neck: JVP - normal, carotids 2+=  Lungs: CTA bilaterally CV:  RRR without murmur or gallop Abd: soft, NT, Positive BS, no hepatomegaly Ext: no C/C/E, distal pulses intact and equal Skin: warm/dry no rash  EKG:  Normal sinus rhythm 60 beats per minute, nonspecific ST and T wave abnormality.  ASSESSMENT AND PLAN: 1. Chest pain concerning for angina. This occurred with emotional stress. She has multiple cardiac risk factors including chronic kidney disease, previous stroke, hypertension, and hyperlipidemia. Recommend a Lexiscan stress Myoview for further risk stratification.  2. Hypertension with suboptimal control. We discussed the addition of a diuretic such as hydrochlorothiazide. She tells me that her primary care physician was concerned about her kidney disease. She hasn't had lab work in some time. Will repeat a metabolic panel to assess renal function and help with decision-making for treatment of her hypertension.  3. Carotid stenosis. Moderate by most recent carotid duplex scan in October 2013. She should be followed up at a one-year  interval.  Sherren Mocha 02/03/2013 5:57 PM

## 2013-02-03 NOTE — Patient Instructions (Addendum)
Your physician recommends that you have lab work today: [facility]  Your physician has requested that you have a lexiscan myoview. For further information please visit [email or web address]. Please follow instruction sheet, as given.  Your physician recommends that you continue on your current medications as directed. Please refer to the Current Medication list given to you today.  Your physician wants you to follow-up in: 1 YEAR with Dr Burt Knack.  You will receive a reminder letter in the mail two months in advance. If you don't receive a letter, please call our office to schedule the follow-up appointment.

## 2013-02-04 ENCOUNTER — Other Ambulatory Visit: Payer: Self-pay

## 2013-02-04 DIAGNOSIS — I10 Essential (primary) hypertension: Secondary | ICD-10-CM

## 2013-02-04 LAB — BASIC METABOLIC PANEL
CO2: 21 mEq/L (ref 19–32)
Chloride: 101 mEq/L (ref 96–112)
Glucose, Bld: 163 mg/dL — ABNORMAL HIGH (ref 70–99)
Potassium: 3.7 mEq/L (ref 3.5–5.1)
Sodium: 132 mEq/L — ABNORMAL LOW (ref 135–145)

## 2013-02-04 MED ORDER — HYDROCHLOROTHIAZIDE 25 MG PO TABS: 25.0000 mg | ORAL_TABLET | Freq: Every day | ORAL | Status: DC

## 2013-02-15 ENCOUNTER — Ambulatory Visit (HOSPITAL_COMMUNITY): Payer: PRIVATE HEALTH INSURANCE | Attending: Cardiovascular Disease | Admitting: Radiology

## 2013-02-15 ENCOUNTER — Other Ambulatory Visit (INDEPENDENT_AMBULATORY_CARE_PROVIDER_SITE_OTHER): Payer: PRIVATE HEALTH INSURANCE

## 2013-02-15 VITALS — BP 185/78 | HR 99 | Ht 60.0 in | Wt 128.0 lb

## 2013-02-15 DIAGNOSIS — R0609 Other forms of dyspnea: Secondary | ICD-10-CM | POA: Insufficient documentation

## 2013-02-15 DIAGNOSIS — R42 Dizziness and giddiness: Secondary | ICD-10-CM | POA: Insufficient documentation

## 2013-02-15 DIAGNOSIS — R5383 Other fatigue: Secondary | ICD-10-CM | POA: Insufficient documentation

## 2013-02-15 DIAGNOSIS — R079 Chest pain, unspecified: Secondary | ICD-10-CM | POA: Insufficient documentation

## 2013-02-15 DIAGNOSIS — R5381 Other malaise: Secondary | ICD-10-CM | POA: Insufficient documentation

## 2013-02-15 DIAGNOSIS — R0602 Shortness of breath: Secondary | ICD-10-CM

## 2013-02-15 DIAGNOSIS — R0989 Other specified symptoms and signs involving the circulatory and respiratory systems: Secondary | ICD-10-CM | POA: Insufficient documentation

## 2013-02-15 DIAGNOSIS — I10 Essential (primary) hypertension: Secondary | ICD-10-CM

## 2013-02-15 LAB — BASIC METABOLIC PANEL
BUN: 12 mg/dL (ref 6–23)
CO2: 26 mEq/L (ref 19–32)
Chloride: 92 mEq/L — ABNORMAL LOW (ref 96–112)
Creatinine, Ser: 1 mg/dL (ref 0.4–1.2)
Glucose, Bld: 153 mg/dL — ABNORMAL HIGH (ref 70–99)

## 2013-02-15 MED ORDER — REGADENOSON 0.4 MG/5ML IV SOLN
0.4000 mg | Freq: Once | INTRAVENOUS | Status: AC
  Administered 2013-02-15: 0.4 mg via INTRAVENOUS

## 2013-02-15 MED ORDER — TECHNETIUM TC 99M SESTAMIBI GENERIC - CARDIOLITE
11.0000 | Freq: Once | INTRAVENOUS | Status: AC | PRN
  Administered 2013-02-15: 11 via INTRAVENOUS

## 2013-02-15 MED ORDER — TECHNETIUM TC 99M SESTAMIBI GENERIC - CARDIOLITE
33.0000 | Freq: Once | INTRAVENOUS | Status: AC | PRN
  Administered 2013-02-15: 33 via INTRAVENOUS

## 2013-02-15 NOTE — Progress Notes (Signed)
[facility] [place] [facility] [address] [place], [place] [address] [phone]    Cardiology Nuclear Med Study  Kara Dean is a 79 y.o. female     MRN : [id number]     DOB: 1932-11-16  Procedure Date: 02/15/2013  Nuclear Med Background Indication for Stress Test:  Evaluation for Ischemia History:  '10 ELF:YBOFBP, EF=74%; '12 Echo:EF=60% Cardiac Risk Factors: Carotid Disease, CVA, Family History - CAD, History of Smoking, Hypertension, IDDM Type 2, Lipids and TIA  Symptoms:  Chest Pain with Emotional Stress (last episode of chest discomfort was about 2-weeks ago), SOP/DOE, Fatigue, Light-Headedness and Rapid HR    Nuclear Pre-Procedure Caffeine/Decaff Intake:  None NPO After: 5:30pm   Lungs:  Clear. O2 Sat: 97% on room air. IV 0.9% NS with Angio Cath:  22g  IV Site: R Forearm  IV Started by:  Kara Dean, EMT-P  Chest Size (in):  38 Cup Size: B  Height: 5' (1.524 m)  Weight:  128 lb (58.06 kg)  BMI:  Body mass index is 25 kg/(m^2). Tech Comments:  Aggrenox held > 3 days, Toprol held > 12 hours, per patient. CBG=127 @ 7:30 am.    Nuclear Med Study 1 or 2 day study: 1 day  Stress Test Type:  Carlton Adam  Reading MD: Mertie Moores, MD  Order Authorizing Provider:  Sherren Mocha, MD  Resting Radionuclide: Technetium 56mSestamibi  Resting Radionuclide Dose: 11.0 mCi   Stress Radionuclide:  Technetium 93mestamibi  Stress Radionuclide Dose: 33.0 mCi           Stress Protocol Rest HR: 99 Stress HR: 116  Rest BP: 185/78 Stress BP: 186/57  Exercise Time (min): n/a METS: n/a   Predicted Max HR: 143 bpm % Max HR: 81.12 bpm Rate Pressure Product: 21576   Dose of Adenosine (mg):  n/a Dose of Lexiscan: 0.4 mg  Dose of Atropine (mg): n/a Dose of Dobutamine: n/a mcg/kg/min (at max HR)  Stress Test Technologist: Kara Dean  Nuclear Technologist:  Kara Dean     Rest Procedure:  Myocardial perfusion imaging was performed at rest 45 minutes  following the intravenous administration of Technetium 9952mstamibi.  Rest ECG: NSR with non-specific ST-T wave changes  Stress Procedure:  The patient received IV Lexiscan 0.4 mg over 15-seconds.  Technetium 30m64mtamibi injected at 30-seconds.  Quantitative spect images were obtained after a 45 minute delay.  Stress ECG: No significant ST segment change suggestive of ischemia.  QPS Raw Data Images:  Normal; no motion artifact; normal heart/lung ratio. Stress Images:  Normal homogeneous uptake in all areas of the myocardium. Rest Images:  Normal homogeneous uptake in all areas of the myocardium. Subtraction (SDS):  No evidence of ischemia. Transient Ischemic Dilatation (Normal <1.22):  1.05 Lung/Heart Ratio (Normal <0.45):  0.38  Quantitative Gated Spect Images QGS EDV:  50 ml QGS ESV:  16 ml  Impression Exercise Capacity:  Lexiscan with no exercise. BP Response:  Normal blood pressure response. Clinical Symptoms:  No significant symptoms noted. ECG Impression:  No significant ST segment change suggestive of ischemia. Comparison with Prior Nuclear Study: No significant change from previous study 09/13/09  Overall Impression:  Normal stress nuclear study.  LV Ejection Fraction: 68%.  LV Wall Motion:  NL LV Function; NL Wall Motion.   PhilThayer Headings.,Brooke BonitoD, FACCKissimmee Endoscopy Center4/2014, 5:20 PM Office - [phone]

## 2013-02-22 ENCOUNTER — Other Ambulatory Visit: Payer: Self-pay | Admitting: *Deleted

## 2013-02-22 ENCOUNTER — Other Ambulatory Visit: Payer: Self-pay

## 2013-02-22 DIAGNOSIS — I10 Essential (primary) hypertension: Secondary | ICD-10-CM

## 2013-02-22 MED ORDER — HYDROCHLOROTHIAZIDE 25 MG PO TABS: 25.0000 mg | ORAL_TABLET | Freq: Every day | ORAL | Status: DC

## 2013-02-23 ENCOUNTER — Other Ambulatory Visit: Payer: Self-pay | Admitting: *Deleted

## 2013-02-23 MED ORDER — ASPIRIN-DIPYRIDAMOLE ER 25-200 MG PO CP12: 1.0000 | ORAL_CAPSULE | Freq: Two times a day (BID) | ORAL | Status: DC

## 2013-02-23 MED ORDER — ESOMEPRAZOLE MAGNESIUM 40 MG PO CPDR: 40.0000 mg | DELAYED_RELEASE_CAPSULE | Freq: Every day | ORAL | Status: DC

## 2013-02-23 MED ORDER — ATORVASTATIN CALCIUM 10 MG PO TABS: 10.0000 mg | ORAL_TABLET | Freq: Every day | ORAL | Status: DC

## 2013-02-23 MED ORDER — VALSARTAN 320 MG PO TABS: 320.0000 mg | ORAL_TABLET | Freq: Every day | ORAL | Status: DC

## 2013-02-23 NOTE — Telephone Encounter (Signed)
i couldn't find either of these meds in her chart? DFS pt., sent chart back for u to look at

## 2013-02-24 MED ORDER — METOPROLOL SUCCINATE ER 100 MG PO TB24: 100.0000 mg | ORAL_TABLET | Freq: Every day | ORAL | Status: DC

## 2013-02-24 MED ORDER — GLIPIZIDE ER 10 MG PO TB24: 10.0000 mg | ORAL_TABLET | Freq: Every day | ORAL | Status: DC

## 2013-02-24 NOTE — Telephone Encounter (Signed)
[facility] states yes this is what she takes and i told them to go ahead and fill them

## 2013-02-24 NOTE — Telephone Encounter (Signed)
meds not on list in chart. Please check on this with [facility]

## 2013-02-24 NOTE — Telephone Encounter (Signed)
ALREADY BEEN CALLED INTO [facility]

## 2013-02-26 ENCOUNTER — Encounter: Payer: Self-pay | Admitting: Nurse Practitioner

## 2013-02-26 ENCOUNTER — Ambulatory Visit (INDEPENDENT_AMBULATORY_CARE_PROVIDER_SITE_OTHER): Payer: Medicare Other | Admitting: Nurse Practitioner

## 2013-02-26 VITALS — BP 141/53 | HR 54 | Temp 97.7°F | Ht 60.0 in | Wt 132.0 lb

## 2013-02-26 DIAGNOSIS — Z8639 Personal history of other endocrine, nutritional and metabolic disease: Secondary | ICD-10-CM

## 2013-02-26 DIAGNOSIS — Z862 Personal history of diseases of the blood and blood-forming organs and certain disorders involving the immune mechanism: Secondary | ICD-10-CM

## 2013-02-26 DIAGNOSIS — E785 Hyperlipidemia, unspecified: Secondary | ICD-10-CM

## 2013-02-26 DIAGNOSIS — I10 Essential (primary) hypertension: Secondary | ICD-10-CM

## 2013-02-26 LAB — COMPLETE METABOLIC PANEL WITH GFR
Albumin: 3.9 g/dL (ref 3.5–5.2)
Alkaline Phosphatase: 60 U/L (ref 39–117)
BUN: 8 mg/dL (ref 6–23)
GFR, Est African American: 69 mL/min
GFR, Est Non African American: 60 mL/min
Glucose, Bld: 135 mg/dL — ABNORMAL HIGH (ref 70–99)
Potassium: 4.5 mEq/L (ref 3.5–5.3)
Total Bilirubin: 0.5 mg/dL (ref 0.3–1.2)

## 2013-02-26 NOTE — Patient Instructions (Signed)
Diets for Diabetes, Food Labeling Look at food labels to help you decide how much of a product you can eat. You will want to check the amount of total carbohydrate in a serving to see how the food fits into your meal plan. In the list of ingredients, the ingredient present in the largest amount by weight must be listed first, followed by the other ingredients in descending order. STANDARD OF IDENTITY Most products have a list of ingredients. However, foods that the Food and Drug Administration (FDA) has given a standard of identity do not need a list of ingredients. A standard of identity means that a food must contain certain ingredients if it is called a particular name. Examples are mayonnaise, peanut butter, ketchup, jelly, and cheese. LABELING TERMS There are many terms found on food labels. Some of these terms have specific definitions. Some terms are regulated by the FDA, and the FDA has clearly specified how they can be used. Others are not regulated or well-defined and can be misleading and confusing. SPECIFICALLY DEFINED TERMS Nutritive Sweetener.  A sweetener that contains calories,such as table sugar or honey. Nonnutritive Sweetener.  A sweetener with few or no calories,such as saccharin, aspartame, sucralose, and cyclamate. LABELING TERMS REGULATED BY THE FDA Free.  The product contains only a tiny or small amount of fat, cholesterol, sodium, sugar, or calories. For example, a "fat-free" product will contain less than 0.5 g of fat per serving. Low.  A food described as "low" in fat, saturated fat, cholesterol, sodium, or calories could be eaten fairly often without exceeding dietary guidelines. For example, "low in fat" means no more than 3 g of fat per serving. Lean.  "Lean" and "extra lean" are U.S. Department of Agriculture Scientist, research (physical sciences)) terms for use on meat and poultry products. "Lean" means the product contains less than 10 g of fat, 4 g of saturated fat, and 95 mg of cholesterol  per serving. "Lean" is not as low in fat as a product labeled "low." Extra Lean.  "Extra lean" means the product contains less than 5 g of fat, 2 g of saturated fat, and 95 mg of cholesterol per serving. While "extra lean" has less fat than "lean," it is still higher in fat than a product labeled "low." Reduced, Less, Fewer.  A diet product that contains 25% less of a nutrient or calories than the regular version. For example, hot dogs might be labeled "25% less fat than our regular hot dogs." Light/Lite.  A diet product that contains  fewer calories or  the fat of the original. For example, "light in sodium" means a product with  the usual sodium. More.  One serving contains at least 10% more of the daily value of a vitamin, mineral, or fiber than usual. Good Source Of.  One serving contains 10% to 19% of the daily value for a particular vitamin, mineral, or fiber. Excellent Source Of.  One serving contains 20% or more of the daily value for a particular nutrient. Other terms used might be "high in" or "rich in." Enriched or Fortified.  The product contains added vitamins, minerals, or protein. Nutrition labeling must be used on enriched or fortified foods. Imitation.  The product has been altered so that it is lower in protein, vitamins, or minerals than the usual food,such as imitation peanut butter. Total Fat.  The number listed is the total of all fat found in a serving of the product. Under total fat, food labels must list saturated fat and  trans fat, which are associated with raising bad cholesterol and an increased risk of heart blood vessel disease. Saturated Fat.  Mainly fats from animal-based sources. Some examples are red meat, cheese, cream, whole milk, and coconut oil. Trans Fat.  Found in some fried snack foods, packaged foods, and fried restaurant foods. It is recommended you eat as close to 0 g of trans fat as possible, since it raises bad cholesterol and lowers  good cholesterol. Polyunsaturated and Monounsaturated Fats.  More healthful fats. These fats are from plant sources. Total Carbohydrate.  The number of carbohydrate grams in a serving of the product. Under total carbohydrate are listed the other carbohydrate sources, such as dietary fiber and sugars. Dietary Fiber.  A carbohydrate from plant sources. Sugars.  Sugars listed on the label contain all naturally occurring sugars as well as added sugars. LABELING TERMS NOT REGULATED BY THE FDA Sugarless.  Table sugar (sucrose) has not been added. However, the manufacturer may use another form of sugar in place of sucrose to sweeten the product. For example, sugar alcohols are used to sweeten foods. Sugar alcohols are a form of sugar but are not table sugar. If a product contains sugar alcohols in place of sucrose, it can still be labeled "sugarless." Low Salt, Salt-Free, Unsalted, No Salt, No Salt Added, Without Added Salt.  Food that is usually processed with salt has been made without salt. However, the food may contain sodium-containing additives, such as preservatives, leavening agents, or flavorings. Natural.  This term has no legal meaning. Organic.  Foods that are certified as organic have been inspected and approved by the USDA to ensure they are produced without pesticides, fertilizers containing synthetic ingredients, bioengineering, or ionizing radiation. Document Released: 10/24/2003 Document Revised: 01/13/2012 Document Reviewed: 05/11/2009 [facility] Patient Information 2013 [place].

## 2013-02-26 NOTE — Progress Notes (Signed)
Subjective:    Patient ID: Kara Dean, female    DOB: [birth year], 79 y.o.   MRN: [id number]  Hypertension This is a chronic problem. The current episode started more than 1 year ago. The problem is unchanged. The problem is uncontrolled. Pertinent negatives include no blurred vision, chest pain, headaches, orthopnea, palpitations, peripheral edema or shortness of breath. Risk factors for coronary artery disease include dyslipidemia, diabetes mellitus, sedentary lifestyle and post-menopausal state. Past treatments include calcium channel blockers and angiotensin blockers. The current treatment provides moderate improvement. Compliance problems include diet and exercise.  Hypertensive end-organ damage includes CVA. There is no history of angina.  Hyperlipidemia This is a chronic problem. The current episode started more than 1 year ago. The problem is uncontrolled. Recent lipid tests were reviewed and are variable. Exacerbating diseases include diabetes. Pertinent negatives include no chest pain, focal weakness, leg pain, myalgias or shortness of breath. Current antihyperlipidemic treatment includes statins. Compliance problems include adherence to diet and adherence to exercise.  Risk factors for coronary artery disease include diabetes mellitus and post-menopausal.  Diabetes She presents for her follow-up diabetic visit. She has type 2 diabetes mellitus. No MedicAlert identification noted. The initial diagnosis of diabetes was made 20 years ago. Her disease course has been fluctuating. Pertinent negatives for hypoglycemia include no headaches. Pertinent negatives for diabetes include no blurred vision, no chest pain, no polydipsia, no polyphagia, no polyuria, no weakness and no weight loss. Symptoms are stable. Diabetic complications include a CVA. Current diabetic treatment includes insulin injections and oral agent (monotherapy). She is compliant with treatment all of the time. She is currently  taking insulin at bedtime. Rotation sites for injection include the abdominal wall. Her weight is fluctuating minimally. When asked about meal planning, she reported none. She has not had a previous visit with a dietician. She rarely participates in exercise. Her breakfast blood glucose is taken between 8-9 am. Her breakfast blood glucose range is generally 90-110 mg/dl. Her overall blood glucose range is 90-110 mg/dl. An ACE inhibitor/angiotensin II receptor blocker is not being taken. She does not see a podiatrist.Eye exam is not current (several years ago).  GAD Takes lorzepam BID. Gets very anxious if doesn't take GERD Nexium works good. No symptoms when she takes. Hx CVA Takes aggrenox- No bleeding  Review of Systems  Constitutional: Negative for weight loss.  Eyes: Negative for blurred vision.  Respiratory: Negative for shortness of breath.   Cardiovascular: Negative for chest pain, palpitations and orthopnea.  Endocrine: Negative for polydipsia, polyphagia and polyuria.  Musculoskeletal: Negative for myalgias.  Neurological: Negative for focal weakness, weakness and headaches.  All other systems reviewed and are negative.       Objective:   Physical Exam  Constitutional: She is oriented to person, place, and time. She appears well-developed and well-nourished.  HENT:  Nose: Nose normal.  Mouth/Throat: Oropharynx is clear and moist.  Eyes: EOM are normal.  Neck: Trachea normal, normal range of motion and full passive range of motion without pain. Neck supple. No JVD present. Carotid bruit is not present. No thyromegaly present.  Cardiovascular: Normal rate, regular rhythm, normal heart sounds and intact distal pulses.  Exam reveals no gallop and no friction rub.   No murmur heard. Pulmonary/Chest: Effort normal and breath sounds normal.  Abdominal: Soft. Bowel sounds are normal. She exhibits no distension and no mass. There is no tenderness.  Musculoskeletal: Normal range of  motion.  Lymphadenopathy:    She has no cervical adenopathy.  Neurological: She is alert and oriented to person, place, and time. She has normal reflexes.  Skin: Skin is warm and dry.  Psychiatric: She has a normal mood and affect. Her behavior is normal. Judgment and thought content normal.   BP 141/53  Pulse 54  Temp(Src) 97.7 F (36.5 C) (Oral)  Ht 5' (1.524 m)  Wt 132 lb (59.875 kg)  BMI 25.78 kg/m2 Results for orders placed in visit on 02/26/13  POCT GLYCOSYLATED HEMOGLOBIN (HGB A1C)      Result Value Range   Hemoglobin A1C 7.7%            Assessment & Plan:  A- DYSLIPIDEMIA - Plan: COMPLETE METABOLIC PANEL WITH GFR, NMR Lipoprofile with Lipids  HYPERTENSION - Plan: COMPLETE METABOLIC PANEL WITH GFR, NMR Lipoprofile with Lipids  DIABETES MELLITUS, TYPE II, HX OF - Plan: POCT glycosylated hemoglobin (Hb A1C), COMPLETE METABOLIC PANEL WITH GFR, NMR Lipoprofile with Lipids  P-  Continue all meds   Labs pending  Low carb diet encouraged  Inrease lantus to 35 u Qhs  F/U in 3 months  Fall precautions discussed [name], FNP

## 2013-03-01 LAB — NMR LIPOPROFILE WITH LIPIDS
HDL Size: 8.9 nm — ABNORMAL LOW (ref >=9.2)
LDL Particle Number: 864 nmol/L (ref <1000)
LDL Size: 20.3 nm — ABNORMAL LOW (ref >20.5)
Large VLDL-P: 7 nmol/L — ABNORMAL HIGH (ref <=2.7)
Triglycerides: 182 mg/dL — ABNORMAL HIGH (ref <150)
VLDL Size: 52.8 nm — ABNORMAL HIGH (ref <=46.6)

## 2013-03-05 ENCOUNTER — Other Ambulatory Visit: Payer: Self-pay | Admitting: *Deleted

## 2013-03-05 ENCOUNTER — Other Ambulatory Visit: Payer: Self-pay | Admitting: Nurse Practitioner

## 2013-03-05 MED ORDER — INSULIN GLARGINE 100 UNIT/ML ~~LOC~~ SOLN: 35.0000 [IU] | Freq: Every day | SUBCUTANEOUS | Status: DC

## 2013-04-08 ENCOUNTER — Other Ambulatory Visit: Payer: Self-pay | Admitting: Family Medicine

## 2013-05-04 ENCOUNTER — Other Ambulatory Visit: Payer: Self-pay | Admitting: Cardiovascular Disease

## 2013-05-12 ENCOUNTER — Other Ambulatory Visit: Payer: Self-pay | Admitting: Family Medicine

## 2013-05-24 ENCOUNTER — Other Ambulatory Visit: Payer: Self-pay | Admitting: Nurse Practitioner

## 2013-05-26 NOTE — Telephone Encounter (Signed)
LAST AIC 02/25/13 WAS 7.7. LAST OV 02/26/13.

## 2013-05-31 ENCOUNTER — Other Ambulatory Visit: Payer: Self-pay

## 2013-05-31 ENCOUNTER — Other Ambulatory Visit: Payer: Self-pay | Admitting: Nurse Practitioner

## 2013-05-31 MED ORDER — LORAZEPAM 0.5 MG PO TABS: 0.5000 mg | ORAL_TABLET | Freq: Two times a day (BID) | ORAL | Status: DC | PRN

## 2013-05-31 NOTE — Telephone Encounter (Signed)
called to pharmacy.

## 2013-05-31 NOTE — Telephone Encounter (Signed)
Called pharmacy again to correct script. Pt takes  Med BID.

## 2013-05-31 NOTE — Telephone Encounter (Signed)
Last seen MMM 02/26/13   If approved route to nurse to call in

## 2013-06-10 ENCOUNTER — Other Ambulatory Visit: Payer: Self-pay | Admitting: Family Medicine

## 2013-06-10 ENCOUNTER — Other Ambulatory Visit: Payer: Self-pay | Admitting: Nurse Practitioner

## 2013-07-08 ENCOUNTER — Other Ambulatory Visit: Payer: Self-pay | Admitting: Family Medicine

## 2013-07-12 ENCOUNTER — Other Ambulatory Visit: Payer: Self-pay | Admitting: *Deleted

## 2013-07-12 MED ORDER — LORAZEPAM 0.5 MG PO TABS: 0.5000 mg | ORAL_TABLET | Freq: Two times a day (BID) | ORAL | Status: DC | PRN

## 2013-07-12 NOTE — Telephone Encounter (Signed)
LAST OV 02/26/13. IF APPROVED ROUTE TO NURSE TO CALL IN. [place]

## 2013-07-12 NOTE — Telephone Encounter (Signed)
Please fill lorazepam rx

## 2013-07-13 NOTE — Telephone Encounter (Signed)
Called in.

## 2013-09-02 ENCOUNTER — Ambulatory Visit (HOSPITAL_COMMUNITY): Payer: PRIVATE HEALTH INSURANCE | Attending: Cardiovascular Disease

## 2013-09-02 DIAGNOSIS — Z87891 Personal history of nicotine dependence: Secondary | ICD-10-CM | POA: Insufficient documentation

## 2013-09-02 DIAGNOSIS — E785 Hyperlipidemia, unspecified: Secondary | ICD-10-CM | POA: Insufficient documentation

## 2013-09-02 DIAGNOSIS — I658 Occlusion and stenosis of other precerebral arteries: Secondary | ICD-10-CM | POA: Insufficient documentation

## 2013-09-02 DIAGNOSIS — Z8673 Personal history of transient ischemic attack (TIA), and cerebral infarction without residual deficits: Secondary | ICD-10-CM | POA: Insufficient documentation

## 2013-09-02 DIAGNOSIS — I10 Essential (primary) hypertension: Secondary | ICD-10-CM | POA: Insufficient documentation

## 2013-09-02 DIAGNOSIS — I6529 Occlusion and stenosis of unspecified carotid artery: Secondary | ICD-10-CM

## 2013-09-03 ENCOUNTER — Other Ambulatory Visit: Payer: Self-pay | Admitting: General Practice

## 2013-09-03 ENCOUNTER — Other Ambulatory Visit: Payer: Self-pay | Admitting: Nurse Practitioner

## 2013-09-06 NOTE — Telephone Encounter (Signed)
Patient last seen in office on 4-25.Rx last filled on 07-13-13. Please advise. If approved please route to Pool B so nurse can call in to pharmacy

## 2013-09-06 NOTE — Telephone Encounter (Signed)
Please call in lorazepam rx

## 2013-09-07 ENCOUNTER — Encounter: Payer: Self-pay | Admitting: Cardiovascular Disease

## 2013-09-07 NOTE — Telephone Encounter (Signed)
This encounter was created in error - please disregard.

## 2013-09-07 NOTE — Telephone Encounter (Signed)
New message   Want carotid doppler results

## 2013-09-08 NOTE — Telephone Encounter (Signed)
Called in.

## 2013-09-08 NOTE — Telephone Encounter (Signed)
Phoned in.

## 2013-09-20 ENCOUNTER — Ambulatory Visit (INDEPENDENT_AMBULATORY_CARE_PROVIDER_SITE_OTHER): Payer: Medicare Other | Admitting: Nurse Practitioner

## 2013-09-20 ENCOUNTER — Encounter: Payer: Self-pay | Admitting: Nurse Practitioner

## 2013-09-20 VITALS — BP 181/65 | HR 53 | Temp 97.1°F | Ht 60.0 in | Wt 128.0 lb

## 2013-09-20 DIAGNOSIS — E785 Hyperlipidemia, unspecified: Secondary | ICD-10-CM

## 2013-09-20 DIAGNOSIS — E119 Type 2 diabetes mellitus without complications: Secondary | ICD-10-CM

## 2013-09-20 DIAGNOSIS — Z862 Personal history of diseases of the blood and blood-forming organs and certain disorders involving the immune mechanism: Secondary | ICD-10-CM

## 2013-09-20 DIAGNOSIS — F329 Major depressive disorder, single episode, unspecified: Secondary | ICD-10-CM

## 2013-09-20 DIAGNOSIS — Z8679 Personal history of other diseases of the circulatory system: Secondary | ICD-10-CM

## 2013-09-20 DIAGNOSIS — I10 Essential (primary) hypertension: Secondary | ICD-10-CM

## 2013-09-20 MED ORDER — GLIPIZIDE ER 10 MG PO TB24: 10.0000 mg | ORAL_TABLET | Freq: Every day | ORAL | Status: DC

## 2013-09-20 MED ORDER — ATORVASTATIN CALCIUM 10 MG PO TABS: 10.0000 mg | ORAL_TABLET | Freq: Every day | ORAL | Status: DC

## 2013-09-20 MED ORDER — ASPIRIN-DIPYRIDAMOLE ER 25-200 MG PO CP12: 1.0000 | ORAL_CAPSULE | Freq: Two times a day (BID) | ORAL | Status: DC

## 2013-09-20 MED ORDER — METOPROLOL SUCCINATE ER 100 MG PO TB24: 100.0000 mg | ORAL_TABLET | Freq: Every day | ORAL | Status: DC

## 2013-09-20 MED ORDER — LORAZEPAM 0.5 MG PO TABS: 0.5000 mg | ORAL_TABLET | Freq: Two times a day (BID) | ORAL | Status: DC

## 2013-09-20 MED ORDER — VALSARTAN-HYDROCHLOROTHIAZIDE 320-25 MG PO TABS: 1.0000 | ORAL_TABLET | Freq: Every day | ORAL | Status: DC

## 2013-09-20 MED ORDER — ESOMEPRAZOLE MAGNESIUM 40 MG PO CPDR: 40.0000 mg | DELAYED_RELEASE_CAPSULE | Freq: Every day | ORAL | Status: DC

## 2013-09-20 MED ORDER — AMLODIPINE BESYLATE 10 MG PO TABS: 10.0000 mg | ORAL_TABLET | Freq: Every day | ORAL | Status: DC

## 2013-09-20 MED ORDER — INSULIN GLARGINE 100 UNIT/ML ~~LOC~~ SOLN: 35.0000 [IU] | Freq: Every day | SUBCUTANEOUS | Status: DC

## 2013-09-20 NOTE — Patient Instructions (Signed)

## 2013-09-20 NOTE — Progress Notes (Signed)
Subjective:    Patient ID: Kara Dean, female    DOB: September 09, 1933, 79 y.o.   MRN: [id number]  Hypertension This is a chronic problem. The current episode started more than 1 year ago. The problem is unchanged. The problem is uncontrolled. Pertinent negatives include no blurred vision, chest pain, headaches, orthopnea, palpitations, peripheral edema or shortness of breath. Risk factors for coronary artery disease include dyslipidemia, diabetes mellitus, sedentary lifestyle and post-menopausal state. Past treatments include calcium channel blockers and angiotensin blockers. The current treatment provides moderate improvement. Compliance problems include diet and exercise.  Hypertensive end-organ damage includes CVA. There is no history of angina.  Hyperlipidemia This is a chronic problem. The current episode started more than 1 year ago. The problem is uncontrolled. Recent lipid tests were reviewed and are variable. Exacerbating diseases include diabetes. Pertinent negatives include no chest pain, focal weakness, leg pain, myalgias or shortness of breath. Current antihyperlipidemic treatment includes statins. Compliance problems include adherence to diet and adherence to exercise.  Risk factors for coronary artery disease include diabetes mellitus and post-menopausal.  Diabetes She presents for her follow-up diabetic visit. She has type 2 diabetes mellitus. No MedicAlert identification noted. The initial diagnosis of diabetes was made 20 years ago. Her disease course has been fluctuating. Pertinent negatives for hypoglycemia include no headaches. Pertinent negatives for diabetes include no blurred vision, no chest pain, no polydipsia, no polyphagia, no polyuria, no weakness and no weight loss. Symptoms are stable. Diabetic complications include a CVA. Current diabetic treatment includes insulin injections and oral agent (monotherapy). She is compliant with treatment all of the time. She is currently  taking insulin at bedtime. Rotation sites for injection include the abdominal wall. Her weight is fluctuating minimally. When asked about meal planning, she reported none. She has not had a previous visit with a dietician. She rarely participates in exercise. Her breakfast blood glucose is taken between 8-9 am. Her breakfast blood glucose range is generally 90-110 mg/dl. Her overall blood glucose range is 90-110 mg/dl. An ACE inhibitor/angiotensin II receptor blocker is not being taken. She does not see a podiatrist.Eye exam is not current (several years ago).  GAD Takes lorzepam BID. Gets very anxious if doesn't take GERD Nexium works good. No symptoms when she takes. Hx CVA Takes aggrenox- No bleeding  Review of Systems  Constitutional: Negative for weight loss.  Eyes: Negative for blurred vision.  Respiratory: Negative for shortness of breath.   Cardiovascular: Negative for chest pain, palpitations and orthopnea.  Endocrine: Negative for polydipsia, polyphagia and polyuria.  Musculoskeletal: Negative for myalgias.  Neurological: Negative for focal weakness, weakness and headaches.  All other systems reviewed and are negative.       Objective:   Physical Exam  Constitutional: She is oriented to person, place, and time. She appears well-developed and well-nourished.  HENT:  Nose: Nose normal.  Mouth/Throat: Oropharynx is clear and moist.  Eyes: EOM are normal.  Neck: Trachea normal, normal range of motion and full passive range of motion without pain. Neck supple. No JVD present. Carotid bruit is not present. No thyromegaly present.  Cardiovascular: Normal rate, regular rhythm, normal heart sounds and intact distal pulses.  Exam reveals no gallop and no friction rub.   No murmur heard. Pulmonary/Chest: Effort normal and breath sounds normal.  Abdominal: Soft. Bowel sounds are normal. She exhibits no distension and no mass. There is no tenderness.  Musculoskeletal: Normal range of  motion.  Lymphadenopathy:    She has no cervical adenopathy.  Neurological: She is alert and oriented to person, place, and time. She has normal reflexes.  Skin: Skin is warm and dry.  Psychiatric: She has a normal mood and affect. Her behavior is normal. Judgment and thought content normal.   BP 181/65  Pulse 53  Temp(Src) 97.1 F (36.2 C) (Oral)  Ht 5' (1.524 m)  Wt 128 lb (58.06 kg)  BMI 25.00 kg/m2 Results for orders placed in visit on 09/20/13  POCT GLYCOSYLATED HEMOGLOBIN (HGB A1C)      Result Value Range   Hemoglobin A1C 7.2            Assessment & Plan:    1. HYPERTENSION   2. DIABETES MELLITUS, TYPE II, HX OF   3. DYSLIPIDEMIA   4. TRANSIENT ISCHEMIC ATTACKS, HX OF   5. DEPRESSION    Orders Placed This Encounter  Procedures  . CMP14+EGFR  . NMR, lipoprofile  . POCT glycosylated hemoglobin (Hb A1C)  . POCT UA - Microalbumin   Meds ordered this encounter  Medications  . amLODipine (NORVASC) 10 MG tablet    Sig: Take 1 tablet (10 mg total) by mouth daily.    Dispense:  30 tablet    Refill:  3    Order Specific Question:  Supervising Provider    Answer:  Chipper Herb [[id number]]  . valsartan-hydrochlorothiazide (DIOVAN HCT) 320-25 MG per tablet    Sig: Take 1 tablet by mouth daily.    Dispense:  30 tablet    Refill:  5    Order Specific Question:  Supervising Provider    Answer:  Chipper Herb [[id number]]  . dipyridamole-aspirin (AGGRENOX) 200-25 MG per 12 hr capsule    Sig: Take 1 capsule by mouth 2 (two) times daily.    Dispense:  60 capsule    Refill:  3    Order Specific Question:  Supervising Provider    Answer:  Chipper Herb [[id number]]  . esomeprazole (NEXIUM) 40 MG capsule    Sig: Take 1 capsule (40 mg total) by mouth daily at 12 noon.    Dispense:  90 capsule    Refill:  1    Order Specific Question:  Supervising Provider    Answer:  Chipper Herb [[id number]]  . metoprolol succinate (TOPROL-XL) 100 MG 24 hr tablet    Sig: Take 1 tablet (100 mg  total) by mouth daily. Take with or immediately following a meal.    Dispense:  90 tablet    Refill:  1    Order Specific Question:  Supervising Provider    Answer:  Chipper Herb [[id number]]  . glipiZIDE (GLIPIZIDE XL) 10 MG 24 hr tablet    Sig: Take 1 tablet (10 mg total) by mouth daily with breakfast.    Dispense:  90 tablet    Refill:  1    Order Specific Question:  Supervising Provider    Answer:  Chipper Herb [[id number]]  . atorvastatin (LIPITOR) 10 MG tablet    Sig: Take 1 tablet (10 mg total) by mouth daily at 6 PM.    Dispense:  90 tablet    Refill:  1    Order Specific Question:  Supervising Provider    Answer:  Chipper Herb [[id number]]  . LORazepam (ATIVAN) 0.5 MG tablet    Sig: Take 1 tablet (0.5 mg total) by mouth 2 (two) times daily.    Dispense:  60 tablet    Refill:  0    Order Specific Question:  Supervising Provider    Answer:  Chipper Herb [[id number]]  . insulin glargine (LANTUS) 100 UNIT/ML injection    Sig: Inject 0.35 mLs (35 Units total) into the skin at bedtime.    Dispense:  10 mL    Refill:  3    Order Specific Question:  Supervising Provider    Answer:  Laurance Flatten, DONALD W [[id number]]   Stopped lisinopril Increased amlodpine Changed diovan to 320/25 daily Continue all meds Labs pending Diet and exercise encouraged Health maintenance reviewed Follow up in 2 weeks- blood pressure check  [name], FNP

## 2013-09-22 LAB — NMR, LIPOPROFILE
Cholesterol: 108 mg/dL (ref <200)
HDL Cholesterol by NMR: 37 mg/dL — ABNORMAL LOW (ref >=40)
HDL Particle Number: 28.4 umol/L — ABNORMAL LOW (ref >=30.5)
LDL Size: 20.5 nm — ABNORMAL LOW (ref >20.5)
Small LDL Particle Number: 517 nmol/L (ref <=527)

## 2013-09-22 LAB — CMP14+EGFR
ALT: 10 IU/L (ref 0–32)
AST: 18 IU/L (ref 0–40)
Albumin/Globulin Ratio: 1.8 (ref 1.1–2.5)
Albumin: 4.4 g/dL (ref 3.5–4.8)
Alkaline Phosphatase: 73 IU/L (ref 39–117)
Creatinine, Ser: 1 mg/dL (ref 0.57–1.00)
GFR calc Af Amer: 62 mL/min/{1.73_m2} (ref >59)
GFR calc non Af Amer: 54 mL/min/{1.73_m2} — ABNORMAL LOW (ref >59)
Glucose: 92 mg/dL (ref 65–99)
Potassium: 4.5 mmol/L (ref 3.5–5.2)
Sodium: 142 mmol/L (ref 134–144)
Total Bilirubin: 0.5 mg/dL (ref 0.0–1.2)

## 2013-10-04 ENCOUNTER — Encounter: Payer: Self-pay | Admitting: Nurse Practitioner

## 2013-10-04 ENCOUNTER — Ambulatory Visit (INDEPENDENT_AMBULATORY_CARE_PROVIDER_SITE_OTHER): Payer: Medicare Other | Admitting: Nurse Practitioner

## 2013-10-04 VITALS — BP 176/70 | HR 59 | Temp 98.8°F | Ht 60.0 in | Wt 128.0 lb

## 2013-10-04 DIAGNOSIS — R5381 Other malaise: Secondary | ICD-10-CM

## 2013-10-04 DIAGNOSIS — I10 Essential (primary) hypertension: Secondary | ICD-10-CM

## 2013-10-04 DIAGNOSIS — R42 Dizziness and giddiness: Secondary | ICD-10-CM

## 2013-10-04 MED ORDER — CLONIDINE HCL 0.1 MG PO TABS: 0.1000 mg | ORAL_TABLET | Freq: Two times a day (BID) | ORAL | Status: DC

## 2013-10-04 NOTE — Patient Instructions (Signed)

## 2013-10-04 NOTE — Progress Notes (Signed)
   Subjective:    Patient ID: Kara Dean, female    DOB: 02-05-1933, 79 y.o.   MRN: [id number]  HPI Patient in c/o dizziness and fatigue- says that she hasn't acted like her usual self- duaghter said one night she went to check on her and she couldn't get her words out- blood pressure has been elevated. Had similar episode 3 years ago- and her b12 was low.    Review of Systems  Constitutional: Positive for fatigue.  HENT: Negative.   Respiratory: Negative.   Cardiovascular: Negative.   Gastrointestinal: Negative.   Genitourinary: Negative.   Neurological: Positive for dizziness, weakness and light-headedness.       Objective:   Physical Exam  Constitutional: She is oriented to person, place, and time. She appears well-developed and well-nourished.  Cardiovascular: Normal rate, regular rhythm and normal heart sounds.   Pulmonary/Chest: Effort normal and breath sounds normal.  Neurological: She is alert and oriented to person, place, and time.  Psychiatric: She has a normal mood and affect. Her behavior is normal. Judgment and thought content normal.    BP 176/70  Pulse 59  Temp(Src) 98.8 F (37.1 C) (Oral)  Ht 5' (1.524 m)  Wt 128 lb (58.06 kg)  BMI 25.00 kg/m2       Assessment & Plan:   1. Other malaise and fatigue   2. Vertigo   3. Hypertension    Orders Placed This Encounter  Procedures  . Anemia Profile B  . CMP14+EGFR   Meds ordered this encounter  Medications  . cloNIDine (CATAPRES) 0.1 MG tablet    Sig: Take 1 tablet (0.1 mg total) by mouth 2 (two) times daily.    Dispense:  60 tablet    Refill:  3    Order Specific Question:  Supervising Provider    Answer:  Chipper Herb [[id number]]    Continue all meds Labs pending If episode reoccurs need to go to ER Follow up in 1 week- with diary of blood pressures  [name], FNP

## 2013-10-05 ENCOUNTER — Telehealth: Payer: Self-pay | Admitting: Family Medicine

## 2013-10-05 LAB — ANEMIA PROFILE B
Basos: 0 %
Eosinophils Absolute: 0.4 10*3/uL (ref 0.0–0.4)
Ferritin: 38 ng/mL (ref 15–150)
Hemoglobin: 12.2 g/dL (ref 11.1–15.9)
Immature Grans (Abs): 0 10*3/uL (ref 0.0–0.1)
Iron Saturation: 16 % (ref 15–55)
Iron: 53 ug/dL (ref 35–155)
Lymphs: 15 %
MCH: 28.2 pg (ref 26.6–33.0)
MCHC: 34.1 g/dL (ref 31.5–35.7)
Monocytes Absolute: 0.8 10*3/uL (ref 0.1–0.9)
Monocytes: 9 %
Neutrophils Relative %: 71 %
Platelets: 254 10*3/uL (ref 150–379)
UIBC: 280 ug/dL (ref 150–375)
Vitamin B-12: 1215 pg/mL — ABNORMAL HIGH (ref 211–946)
WBC: 9 10*3/uL (ref 3.4–10.8)

## 2013-10-05 LAB — CMP14+EGFR
AST: 17 IU/L (ref 0–40)
Albumin/Globulin Ratio: 1.8 (ref 1.1–2.5)
Alkaline Phosphatase: 78 IU/L (ref 39–117)
BUN/Creatinine Ratio: 13 (ref 11–26)
CO2: 26 mmol/L (ref 18–29)
Creatinine, Ser: 0.86 mg/dL (ref 0.57–1.00)
Globulin, Total: 2.5 g/dL (ref 1.5–4.5)
Glucose: 162 mg/dL — ABNORMAL HIGH (ref 65–99)
Sodium: 130 mmol/L — ABNORMAL LOW (ref 134–144)
Total Bilirubin: 0.4 mg/dL (ref 0.0–1.2)
Total Protein: 6.9 g/dL (ref 6.0–8.5)

## 2013-10-05 NOTE — Telephone Encounter (Signed)
Left message to call back on labs

## 2013-10-05 NOTE — Telephone Encounter (Signed)
Message copied by Waverly Ferrari on Tue Oct 05, 2013  4:42 PM ------      Message from: [name]      Created: Tue Oct 05, 2013 11:31 AM       Anemia panel normal      cmp- elevated blood sugar- patient diabetic      Na+ low- add salt to diet      Recheck in 2 months ------

## 2013-10-06 NOTE — Telephone Encounter (Signed)
pt

## 2013-10-06 NOTE — Telephone Encounter (Signed)
Pt notified and verbalized understanding.

## 2013-10-13 ENCOUNTER — Encounter: Payer: Self-pay | Admitting: Nurse Practitioner

## 2013-10-13 ENCOUNTER — Ambulatory Visit (INDEPENDENT_AMBULATORY_CARE_PROVIDER_SITE_OTHER): Payer: Medicare Other | Admitting: Nurse Practitioner

## 2013-10-13 VITALS — BP 148/67 | HR 58 | Temp 97.8°F | Ht 60.0 in | Wt 127.0 lb

## 2013-10-13 DIAGNOSIS — I10 Essential (primary) hypertension: Secondary | ICD-10-CM

## 2013-10-13 NOTE — Progress Notes (Signed)
   Subjective:    Patient ID: Kara Dean, female    DOB: 1933-08-12, 79 y.o.   MRN: [id number]  HPI  Patient was seen October 04, 2014- Her blood pressure was 038 systolic- we added clinidine to her meds and her daughter said it drooped her blood pressure 101/47 for 3 days. Patient stopped taking and has remained in 333'O systolic since then. At home this morning it was 122/76.    Review of Systems  Constitutional: Negative.   HENT: Negative.   Eyes: Negative.   Respiratory: Negative.   Cardiovascular: Negative.        Objective:   Physical Exam  Constitutional: She appears well-developed and well-nourished.  Cardiovascular: Normal rate and normal heart sounds.   Pulmonary/Chest: Effort normal and breath sounds normal.   BP 148/67  Pulse 58  Temp(Src) 97.8 F (36.6 C) (Oral)  Ht 5' (1.524 m)  Wt 127 lb (57.607 kg)  BMI 24.80 kg/m2        Assessment & Plan:   1. Hypertension    Hold clonidine for now Keep close watch of blood pressure RTO in 3 months  [name], FNP

## 2013-10-13 NOTE — Patient Instructions (Signed)

## 2013-12-20 ENCOUNTER — Other Ambulatory Visit: Payer: Self-pay | Admitting: Nurse Practitioner

## 2013-12-22 NOTE — Telephone Encounter (Signed)
Last filled 09/20/13, last seen 10/13/13, call into [facility]

## 2013-12-23 NOTE — Telephone Encounter (Signed)
Called in.

## 2013-12-23 NOTE — Telephone Encounter (Signed)
Please call in ativan with 1 refills

## 2014-01-12 ENCOUNTER — Ambulatory Visit (INDEPENDENT_AMBULATORY_CARE_PROVIDER_SITE_OTHER): Payer: Medicare Other | Admitting: Nurse Practitioner

## 2014-01-12 ENCOUNTER — Encounter: Payer: Self-pay | Admitting: Nurse Practitioner

## 2014-01-12 VITALS — BP 137/55 | HR 58 | Temp 96.8°F | Ht 60.0 in | Wt 126.4 lb

## 2014-01-12 DIAGNOSIS — I10 Essential (primary) hypertension: Secondary | ICD-10-CM

## 2014-01-12 DIAGNOSIS — Z862 Personal history of diseases of the blood and blood-forming organs and certain disorders involving the immune mechanism: Secondary | ICD-10-CM

## 2014-01-12 DIAGNOSIS — F3289 Other specified depressive episodes: Secondary | ICD-10-CM

## 2014-01-12 DIAGNOSIS — E785 Hyperlipidemia, unspecified: Secondary | ICD-10-CM

## 2014-01-12 DIAGNOSIS — F329 Major depressive disorder, single episode, unspecified: Secondary | ICD-10-CM | POA: Insufficient documentation

## 2014-01-12 DIAGNOSIS — F32A Depression, unspecified: Secondary | ICD-10-CM | POA: Insufficient documentation

## 2014-01-12 DIAGNOSIS — Z8639 Personal history of other endocrine, nutritional and metabolic disease: Secondary | ICD-10-CM

## 2014-01-12 LAB — POCT GLYCOSYLATED HEMOGLOBIN (HGB A1C): HEMOGLOBIN A1C: 7.2

## 2014-01-12 MED ORDER — CITALOPRAM HYDROBROMIDE 20 MG PO TABS: 20.0000 mg | ORAL_TABLET | Freq: Every day | ORAL | Status: DC

## 2014-01-12 NOTE — Progress Notes (Signed)
Subjective:    Patient ID: Kara Dean, female    DOB: 1933-03-30, 80 y.o.   MRN: [id number]  Patient presents today for followup of chronic problems. Patient states she feels well. No new complaints.   Hypertension This is a chronic problem. The current episode started more than 1 year ago. The problem is unchanged (Monitors at home at least twice a week - usually 145/56). The problem is uncontrolled. Associated symptoms include palpitations (Intermittent - goes away with rest). Pertinent negatives include no chest pain, headaches, orthopnea, peripheral edema or shortness of breath. Risk factors for coronary artery disease include dyslipidemia, diabetes mellitus, sedentary lifestyle and post-menopausal state. Past treatments include calcium channel blockers and angiotensin blockers. The current treatment provides moderate improvement. Compliance problems include diet and exercise.  Hypertensive end-organ damage includes CVA (3 years ago). There is no history of angina.  Hyperlipidemia This is a chronic problem. The current episode started more than 1 year ago. The problem is uncontrolled. Recent lipid tests were reviewed and are variable. Exacerbating diseases include diabetes. Pertinent negatives include no chest pain, focal weakness, leg pain, myalgias or shortness of breath. Current antihyperlipidemic treatment includes statins. Compliance problems include adherence to diet and adherence to exercise.  Risk factors for coronary artery disease include diabetes mellitus and post-menopausal.  Diabetes She presents for her follow-up diabetic visit. She has type 2 diabetes mellitus. No MedicAlert identification noted. The initial diagnosis of diabetes was made 20 years ago. Her disease course has been fluctuating. Pertinent negatives for hypoglycemia include no headaches. Pertinent negatives for diabetes include no chest pain, no polydipsia, no polyphagia, no polyuria, no weakness and no weight loss.  Symptoms are stable. Diabetic complications include a CVA (3 years ago). Current diabetic treatment includes insulin injections and oral agent (monotherapy). She is compliant with treatment all of the time. She is currently taking insulin at bedtime. Rotation sites for injection include the abdominal wall. Her weight is fluctuating minimally. When asked about meal planning, she reported none. She has not had a previous visit with a dietician. She rarely participates in exercise. Her breakfast blood glucose is taken between 8-9 am. Her breakfast blood glucose range is generally 90-110 mg/dl. Her overall blood glucose range is 90-110 mg/dl. (Patient states when she takes 35 units of Lantus at night, blood sugars are low this morning (<70)) An ACE inhibitor/angiotensin II receptor blocker is not being taken. She does not see a podiatrist.Eye exam is not current (several years ago).  GAD Takes lorzepam as needed. Has not had a recent in a few weeks.  GERD No symptoms as long as taking Nexium.  Hx CVA Takes aggrenox- No bleeding   Review of Systems  Constitutional: Negative for weight loss.  Respiratory: Negative for shortness of breath.   Cardiovascular: Positive for palpitations (Intermittent - goes away with rest). Negative for chest pain and orthopnea.  Endocrine: Negative for polydipsia, polyphagia and polyuria.  Musculoskeletal: Negative for myalgias.  Neurological: Negative for focal weakness, weakness and headaches.  All other systems reviewed and are negative.       Objective:   Physical Exam  Constitutional: She is oriented to person, place, and time. She appears well-developed and well-nourished.  HENT:  Right Ear: External ear normal.  Left Ear: External ear normal.  Nose: Nose normal.  Mouth/Throat: Oropharynx is clear and moist.  Eyes: EOM are normal.  Neck: Trachea normal, normal range of motion and full passive range of motion without pain. Neck supple. No JVD present.  Carotid bruit is not present. No thyromegaly present.  Cardiovascular: Normal rate, regular rhythm, normal heart sounds and intact distal pulses.  Exam reveals no gallop and no friction rub.   No murmur heard. Pulmonary/Chest: Effort normal and breath sounds normal.  Abdominal: Soft. Bowel sounds are normal. She exhibits no distension and no mass. There is no tenderness.  Musculoskeletal: Normal range of motion.  Lymphadenopathy:    She has no cervical adenopathy.  Neurological: She is alert and oriented to person, place, and time. She has normal reflexes.  Skin: Skin is warm and dry.  Psychiatric: She has a normal mood and affect. Her behavior is normal. Judgment and thought content normal.   BP 137/55  Pulse 58  Temp(Src) 96.8 F (36 C) (Oral)  Ht 5' (1.524 m)  Wt 126 lb 6.4 oz (57.335 kg)  BMI 24.69 kg/m2  Results for orders placed in visit on 01/12/14  POCT GLYCOSYLATED HEMOGLOBIN (HGB A1C)      Result Value Ref Range   Hemoglobin A1C 7.2     See depression screen    Assessment & Plan:   1. HYPERTENSION   2. DYSLIPIDEMIA   3. DIABETES MELLITUS, TYPE II, HX OF   4. Depression    Orders Placed This Encounter  Procedures  . CMP14+EGFR  . NMR, lipoprofile  . POCT glycosylated hemoglobin (Hb A1C)   Meds ordered this encounter  Medications  . citalopram (CELEXA) 20 MG tablet    Sig: Take 1 tablet (20 mg total) by mouth daily.    Dispense:  30 tablet    Refill:  3    Order Specific Question:  Supervising Provider    Answer:  Chipper Herb [[id number]]   Snack before bedtime to increase morning blood sugars Start Celexa for depression Labs pending Health maintenance reviewed Diet and exercise encouraged Continue all meds Follow up  In 3 months   [place], FNP

## 2014-01-12 NOTE — Patient Instructions (Signed)

## 2014-01-14 LAB — CMP14+EGFR
A/G RATIO: 1.9 (ref 1.1–2.5)
ALBUMIN: 4.6 g/dL (ref 3.5–4.8)
ALT: 12 IU/L (ref 0–32)
AST: 18 IU/L (ref 0–40)
Alkaline Phosphatase: 73 IU/L (ref 39–117)
BUN/Creatinine Ratio: 10 — ABNORMAL LOW (ref 11–26)
BUN: 9 mg/dL (ref 8–27)
CALCIUM: 9.7 mg/dL (ref 8.7–10.3)
CO2: 24 mmol/L (ref 18–29)
CREATININE: 0.89 mg/dL (ref 0.57–1.00)
Chloride: 88 mmol/L — ABNORMAL LOW (ref 97–108)
GFR calc Af Amer: 72 mL/min/{1.73_m2} (ref >59)
GFR, EST NON AFRICAN AMERICAN: 62 mL/min/{1.73_m2} (ref >59)
GLOBULIN, TOTAL: 2.4 g/dL (ref 1.5–4.5)
GLUCOSE: 121 mg/dL — AB (ref 65–99)
Potassium: 3.9 mmol/L (ref 3.5–5.2)
Sodium: 130 mmol/L — ABNORMAL LOW (ref 134–144)
TOTAL PROTEIN: 7 g/dL (ref 6.0–8.5)
Total Bilirubin: 0.7 mg/dL (ref 0.0–1.2)

## 2014-01-14 LAB — NMR, LIPOPROFILE
Cholesterol: 91 mg/dL (ref <200)
HDL CHOLESTEROL BY NMR: 38 mg/dL — AB (ref >=40)
HDL PARTICLE NUMBER: 23.6 umol/L — AB (ref >=30.5)
LDL Particle Number: 390 nmol/L (ref <1000)
LDL Size: 19.9 nm — ABNORMAL LOW (ref >20.5)
LDLC SERPL CALC-MCNC: 25 mg/dL (ref <100)
LP-IR Score: 47 — ABNORMAL HIGH (ref <=45)
SMALL LDL PARTICLE NUMBER: 304 nmol/L (ref <=527)
TRIGLYCERIDES BY NMR: 141 mg/dL (ref <150)

## 2014-02-08 ENCOUNTER — Telehealth: Payer: Self-pay | Admitting: Nurse Practitioner

## 2014-02-08 NOTE — Telephone Encounter (Signed)
Spoke with patient and advised her she needed to call her dentist and have them look at it today

## 2014-02-14 ENCOUNTER — Ambulatory Visit (INDEPENDENT_AMBULATORY_CARE_PROVIDER_SITE_OTHER): Payer: PRIVATE HEALTH INSURANCE | Admitting: Cardiovascular Disease

## 2014-02-14 ENCOUNTER — Encounter: Payer: Self-pay | Admitting: Cardiovascular Disease

## 2014-02-14 VITALS — BP 130/60 | HR 60 | Ht 61.0 in | Wt 123.0 lb

## 2014-02-14 DIAGNOSIS — I63239 Cerebral infarction due to unspecified occlusion or stenosis of unspecified carotid arteries: Secondary | ICD-10-CM

## 2014-02-14 DIAGNOSIS — R42 Dizziness and giddiness: Secondary | ICD-10-CM

## 2014-02-14 DIAGNOSIS — I10 Essential (primary) hypertension: Secondary | ICD-10-CM

## 2014-02-14 NOTE — Progress Notes (Signed)
HPI:  80 year old woman presenting for followup evaluation. The patient has a history of TIA, hypertension, diabetes, and dyslipidemia. When I saw her last year she complained of substernal chest discomfort. A stress nuclear scan was done with findings as outlined below:  QPS  Raw Data Images: Normal; no motion artifact; normal heart/lung ratio.  Stress Images: Normal homogeneous uptake in all areas of the myocardium.  Rest Images: Normal homogeneous uptake in all areas of the myocardium.  Subtraction (SDS): No evidence of ischemia.  Transient Ischemic Dilatation (Normal <1.22): 1.05  Lung/Heart Ratio (Normal <0.45): 0.38  Quantitative Gated Spect Images  QGS EDV: 50 ml  QGS ESV: 16 ml  Impression  Exercise Capacity: Lexiscan with no exercise.  BP Response: Normal blood pressure response.  Clinical Symptoms: No significant symptoms noted.  ECG Impression: No significant ST segment change suggestive of ischemia.  Comparison with Prior Nuclear Study: No significant change from previous study 09/13/09  Overall Impression: Normal stress nuclear study.  LV Ejection Fraction: 68%. LV Wall Motion: NL LV Function; NL Wall Motion.    last carotid duplex 09/03/2013 showed 40-59% bilateral ICA stenosis which was stable from her previous study.  The patient denies recurrence of chest pain or pressure. She has mild dyspnea with no change. She reports 2 episodes of fairly profound lightheadedness that occurred in church. There are no associated palpitations. She was able to sit down and put her head down to alleviate her symptoms. She's had no frank syncope. No other complaints noted.    Outpatient Encounter Prescriptions as of 02/14/2014  Medication Sig  . amLODipine (NORVASC) 10 MG tablet Take 1 tablet (10 mg total) by mouth daily.  Marland Kitchen atorvastatin (LIPITOR) 10 MG tablet Take 1 tablet (10 mg total) by mouth daily at 6 PM.  . B Complex Vitamins (B COMPLEX-B12) TABS Take 1 tablet by mouth daily.     Marland Kitchen dipyridamole-aspirin (AGGRENOX) 200-25 MG per 12 hr capsule Take 1 capsule by mouth 2 (two) times daily.  Marland Kitchen esomeprazole (NEXIUM) 40 MG capsule Take 1 capsule (40 mg total) by mouth daily at 12 noon.  Marland Kitchen glipiZIDE (GLIPIZIDE XL) 10 MG 24 hr tablet Take 1 tablet (10 mg total) by mouth daily with breakfast.  . insulin glargine (LANTUS) 100 UNIT/ML injection Inject 0.35 mLs (35 Units total) into the skin at bedtime.  Marland Kitchen LORazepam (ATIVAN) 0.5 MG tablet TAKE 1 TABLET 2 TIMES A DAY  . Magnesium 250 MG TABS Take 1 tablet by mouth daily.    . metoprolol succinate (TOPROL-XL) 100 MG 24 hr tablet Take 1 tablet (100 mg total) by mouth daily. Take with or immediately following a meal.  . omega-3 fish oil (MAXEPA) 1000 MG CAPS capsule Take 1 capsule by mouth daily.    . ONE TOUCH ULTRA TEST test strip CHECK BLOOD SUGAR 3 TIMES A DAY  . valsartan-hydrochlorothiazide (DIOVAN HCT) 320-25 MG per tablet Take 1 tablet by mouth daily.  . vitamin D, CHOLECALCIFEROL, 400 UNITS tablet Take 400 Units by mouth daily.    . [DISCONTINUED] citalopram (CELEXA) 20 MG tablet Take 1 tablet (20 mg total) by mouth daily.    Allergies  Allergen Reactions  . Codeine   . Latex   . Morphine   . Niacin   . Sulfa Antibiotics   . Sulfonamide Derivatives     Past Medical History  Diagnosis Date  . Hypertension   . History of TIAs   . Dyslipidemia   . CVA (cerebral infarction)   .  Diabetes mellitus     type 2  . Depression   . Osteoarthritis   . Carotid bruit   . Hyponatremia     ROS: Negative except as per HPI  BP 130/60  Pulse 60  Ht 5' 1"  (1.549 m)  Wt 123 lb (55.792 kg)  BMI 23.25 kg/m2  PHYSICAL EXAM: Pt is alert and oriented, NAD HEENT: normal Neck: JVP - normal, carotids 2+= without bruits Lungs: CTA bilaterally CV: RRR without murmur or gallop Abd: soft, NT, Positive BS, no hepatomegaly Ext: no C/C/E, distal pulses intact and equal Skin: warm/dry no rash  EKG:  Normal sinus rhythm with  sinus arrhythmia, 60 beats per minute, nonspecific ST and T wave abnormality.  ASSESSMENT AND PLAN: 1. Pre-syncope. Suspect neuro depressed her event (these have occurred in church). Discussed mechanism with the patient. Advised that she increase fluids to at least 6 glasses of water per day. She currently just drinks one to 2 glasses per day. Also discussed frequent small meals. Again, she currently only eats breakfast and dinner. I think this probably would be much better for her diabetes as well. Other considerations in the differential diagnosis include arrhythmia or hypoglycemic episodes. Suspect these are less likely. She will contact us if frank syncope or recurrent events occur.  2. Hypertension. Blood pressure appears well-controlled on a combination of amlodipine, metoprolol succinate, and valsartan/hydrochlorothiazide.  3. Carotid stenosis. Most recent duplex scan reviewed and she had stable moderate disease with one year followup recommended in November 2015.  4. Hyperlipidemia. Treated with atorvastatin. Followed by primary care physician.  5. Chest pain. Symptoms have essentially resolved. Her stress Myoview last year showed no ischemia.  Sherren Mocha 02/14/2014 12:29 PM

## 2014-02-14 NOTE — Patient Instructions (Signed)
Your physician wants you to follow-up in: 1 YEAR with Dr Burt Knack.  You will receive a reminder letter in the mail two months in advance. If you don't receive a letter, please call our office to schedule the follow-up appointment.  Your physician recommends that you continue on your current medications as directed. Please refer to the Current Medication list given to you today.  Please increase your fluid intake to at least six 8 oz glasses of water. Please eat small frequent meals.

## 2014-03-02 ENCOUNTER — Other Ambulatory Visit: Payer: Self-pay | Admitting: Nurse Practitioner

## 2014-04-05 ENCOUNTER — Other Ambulatory Visit: Payer: Self-pay | Admitting: Nurse Practitioner

## 2014-04-18 ENCOUNTER — Ambulatory Visit: Payer: Medicare Other | Admitting: Nurse Practitioner

## 2014-04-27 ENCOUNTER — Ambulatory Visit (INDEPENDENT_AMBULATORY_CARE_PROVIDER_SITE_OTHER): Payer: Medicare Other | Admitting: Nurse Practitioner

## 2014-04-27 ENCOUNTER — Encounter: Payer: Self-pay | Admitting: Nurse Practitioner

## 2014-04-27 VITALS — BP 138/70 | HR 62 | Temp 98.3°F | Ht 61.0 in | Wt 126.0 lb

## 2014-04-27 DIAGNOSIS — I6350 Cerebral infarction due to unspecified occlusion or stenosis of unspecified cerebral artery: Secondary | ICD-10-CM

## 2014-04-27 DIAGNOSIS — Z13820 Encounter for screening for osteoporosis: Secondary | ICD-10-CM

## 2014-04-27 DIAGNOSIS — K219 Gastro-esophageal reflux disease without esophagitis: Secondary | ICD-10-CM

## 2014-04-27 DIAGNOSIS — E785 Hyperlipidemia, unspecified: Secondary | ICD-10-CM

## 2014-04-27 DIAGNOSIS — F411 Generalized anxiety disorder: Secondary | ICD-10-CM

## 2014-04-27 DIAGNOSIS — F329 Major depressive disorder, single episode, unspecified: Secondary | ICD-10-CM

## 2014-04-27 DIAGNOSIS — E119 Type 2 diabetes mellitus without complications: Secondary | ICD-10-CM

## 2014-04-27 DIAGNOSIS — Z862 Personal history of diseases of the blood and blood-forming organs and certain disorders involving the immune mechanism: Secondary | ICD-10-CM

## 2014-04-27 DIAGNOSIS — F3289 Other specified depressive episodes: Secondary | ICD-10-CM

## 2014-04-27 DIAGNOSIS — Z8639 Personal history of other endocrine, nutritional and metabolic disease: Principal | ICD-10-CM

## 2014-04-27 DIAGNOSIS — I10 Essential (primary) hypertension: Secondary | ICD-10-CM

## 2014-04-27 LAB — POCT GLYCOSYLATED HEMOGLOBIN (HGB A1C): Hemoglobin A1C: 7.2

## 2014-04-27 MED ORDER — AMLODIPINE BESYLATE 10 MG PO TABS: ORAL_TABLET | ORAL | Status: DC

## 2014-04-27 MED ORDER — VALSARTAN-HYDROCHLOROTHIAZIDE 320-25 MG PO TABS: ORAL_TABLET | ORAL | Status: DC

## 2014-04-27 MED ORDER — ATORVASTATIN CALCIUM 10 MG PO TABS: ORAL_TABLET | ORAL | Status: DC

## 2014-04-27 MED ORDER — ESOMEPRAZOLE MAGNESIUM 40 MG PO CPDR: 40.0000 mg | DELAYED_RELEASE_CAPSULE | Freq: Every day | ORAL | Status: DC

## 2014-04-27 MED ORDER — INSULIN GLARGINE 100 UNIT/ML ~~LOC~~ SOLN: 35.0000 [IU] | Freq: Every day | SUBCUTANEOUS | Status: DC

## 2014-04-27 MED ORDER — LORAZEPAM 0.5 MG PO TABS: ORAL_TABLET | ORAL | Status: DC

## 2014-04-27 MED ORDER — GLUCOSE BLOOD VI STRP: ORAL_STRIP | Status: DC

## 2014-04-27 MED ORDER — METOPROLOL SUCCINATE ER 100 MG PO TB24: ORAL_TABLET | ORAL | Status: DC

## 2014-04-27 MED ORDER — GLIPIZIDE ER 10 MG PO TB24: 10.0000 mg | ORAL_TABLET | Freq: Every day | ORAL | Status: DC

## 2014-04-27 MED ORDER — ASPIRIN-DIPYRIDAMOLE ER 25-200 MG PO CP12: 1.0000 | ORAL_CAPSULE | Freq: Two times a day (BID) | ORAL | Status: DC

## 2014-04-27 NOTE — Patient Instructions (Signed)

## 2014-04-27 NOTE — Progress Notes (Signed)
Subjective:    Patient ID: Kara Dean, female    DOB: 1933-06-28, 80 y.o.   MRN: [id number]  Patient presents today for followup of chronic problems. Patient states she feels well. No new complaints.   Hypertension This is a chronic problem. The current episode started more than 1 year ago. The problem is unchanged (Monitors at home at least twice a week - usually 145/56). The problem is uncontrolled. Associated symptoms include palpitations (Intermittent - goes away with rest). Pertinent negatives include no chest pain, headaches, orthopnea, peripheral edema or shortness of breath. Risk factors for coronary artery disease include dyslipidemia, diabetes mellitus, sedentary lifestyle and post-menopausal state. Past treatments include calcium channel blockers and angiotensin blockers. The current treatment provides moderate improvement. Compliance problems include diet and exercise.  Hypertensive end-organ damage includes CVA (3 years ago). There is no history of angina.  Hyperlipidemia This is a chronic problem. The current episode started more than 1 year ago. The problem is uncontrolled. Recent lipid tests were reviewed and are variable. Exacerbating diseases include diabetes. Pertinent negatives include no chest pain, focal weakness, leg pain, myalgias or shortness of breath. Current antihyperlipidemic treatment includes statins. Compliance problems include adherence to diet and adherence to exercise.  Risk factors for coronary artery disease include diabetes mellitus and post-menopausal.  Diabetes She presents for her follow-up diabetic visit. She has type 2 diabetes mellitus. No MedicAlert identification noted. The initial diagnosis of diabetes was made 20 years ago. Her disease course has been fluctuating. Pertinent negatives for hypoglycemia include no headaches. Pertinent negatives for diabetes include no chest pain, no polydipsia, no polyphagia, no polyuria, no weakness and no weight loss.  Symptoms are stable. Diabetic complications include a CVA (3 years ago). Current diabetic treatment includes insulin injections and oral agent (monotherapy). She is compliant with treatment all of the time. She is currently taking insulin at bedtime. Rotation sites for injection include the abdominal wall. Her weight is fluctuating minimally. When asked about meal planning, she reported none. She has not had a previous visit with a dietician. She rarely participates in exercise. Her breakfast blood glucose is taken between 8-9 am. Her breakfast blood glucose range is generally 90-110 mg/dl. Her overall blood glucose range is 90-110 mg/dl. (Patient states when she takes 35 units of Lantus at night, blood sugars are low this morning (<70)) An ACE inhibitor/angiotensin II receptor blocker is not being taken. She does not see a podiatrist.Eye exam is not current (several years ago).  GAD Takes lorzepam as needed. Has not had a recent in a few weeks.  GERD No symptoms as long as taking Nexium.  Hx CVA Takes aggrenox- No bleeding   Review of Systems  Constitutional: Negative for weight loss.  Respiratory: Negative for shortness of breath.   Cardiovascular: Positive for palpitations (Intermittent - goes away with rest). Negative for chest pain and orthopnea.  Endocrine: Negative for polydipsia, polyphagia and polyuria.  Musculoskeletal: Negative for myalgias.  Neurological: Negative for focal weakness, weakness and headaches.  All other systems reviewed and are negative.      Objective:   Physical Exam  Constitutional: She is oriented to person, place, and time. She appears well-developed and well-nourished.  HENT:  Right Ear: External ear normal.  Left Ear: External ear normal.  Nose: Nose normal.  Mouth/Throat: Oropharynx is clear and moist.  Eyes: EOM are normal.  Neck: Trachea normal, normal range of motion and full passive range of motion without pain. Neck supple. No JVD present. Carotid  bruit is not present. No thyromegaly present.  Cardiovascular: Normal rate, regular rhythm, normal heart sounds and intact distal pulses.  Exam reveals no gallop and no friction rub.   No murmur heard. Pulmonary/Chest: Effort normal and breath sounds normal.  Abdominal: Soft. Bowel sounds are normal. She exhibits no distension and no mass. There is no tenderness.  Musculoskeletal: Normal range of motion.  Lymphadenopathy:    She has no cervical adenopathy.  Neurological: She is alert and oriented to person, place, and time. She has normal reflexes.  Skin: Skin is warm and dry.  Psychiatric: She has a normal mood and affect. Her behavior is normal. Judgment and thought content normal.   BP 138/70  Pulse 62  Temp(Src) 98.3 F (36.8 C) (Oral)  Ht 5' 1"  (1.549 m)  Wt 126 lb (57.153 kg)  BMI 23.82 kg/m2  Results for orders placed in visit on 04/27/14  POCT GLYCOSYLATED HEMOGLOBIN (HGB A1C)      Result Value Ref Range   Hemoglobin A1C 7.2%     See depression screen    Assessment & Plan:   1. DIABETES MELLITUS, TYPE II, HX OF   2. DYSLIPIDEMIA   3. HYPERTENSION   4. DEPRESSION   5. CVA   6. Screening for osteoporosis   7. Gastroesophageal reflux disease without esophagitis   8. GAD (generalized anxiety disorder)    Orders Placed This Encounter  Procedures  . DG Bone Density    Standing Status: Future     Number of Occurrences:      Standing Expiration Date: 06/27/2015    Order Specific Question:  Reason for Exam (SYMPTOM  OR DIAGNOSIS REQUIRED)    Answer:  screeniing    Order Specific Question:  Preferred imaging location?    Answer:  Internal  . CMP14+EGFR  . NMR, lipoprofile  . POCT glycosylated hemoglobin (Hb A1C)   Meds ordered this encounter  Medications  . amLODipine (NORVASC) 10 MG tablet    Sig: TAKE 1 TABLET DAILY    Dispense:  90 tablet    Refill:  1    Order Specific Question:  Supervising Provider    Answer:  Chipper Herb [[id number]]  . atorvastatin  (LIPITOR) 10 MG tablet    Sig: TAKE 1 TABLET ONCE DAILY AT 6PM    Dispense:  90 tablet    Refill:  1    Order Specific Question:  Supervising Provider    Answer:  Chipper Herb [[id number]]  . dipyridamole-aspirin (AGGRENOX) 200-25 MG per 12 hr capsule    Sig: Take 1 capsule by mouth 2 (two) times daily.    Dispense:  180 capsule    Refill:  1    Order Specific Question:  Supervising Provider    Answer:  Chipper Herb [[id number]]  . esomeprazole (NEXIUM) 40 MG capsule    Sig: Take 1 capsule (40 mg total) by mouth daily at 12 noon.    Dispense:  90 capsule    Refill:  1    Order Specific Question:  Supervising Provider    Answer:  Chipper Herb [[id number]]  . glipiZIDE (GLIPIZIDE XL) 10 MG 24 hr tablet    Sig: Take 1 tablet (10 mg total) by mouth daily with breakfast.    Dispense:  90 tablet    Refill:  1    Order Specific Question:  Supervising Provider    Answer:  Chipper Herb [[id number]]  . insulin glargine (LANTUS) 100 UNIT/ML injection  Sig: Inject 0.35 mLs (35 Units total) into the skin at bedtime.    Dispense:  10 mL    Refill:  3    Order Specific Question:  Supervising Provider    Answer:  Chipper Herb [[id number]]  . LORazepam (ATIVAN) 0.5 MG tablet    Sig: TAKE 1 TABLET 2 TIMES A DAY    Dispense:  60 tablet    Refill:  1    Order Specific Question:  Supervising Provider    Answer:  Chipper Herb [[id number]]  . metoprolol succinate (TOPROL-XL) 100 MG 24 hr tablet    Sig: TAKE 1 TABLET ONCE A DAY WITH A MEAL    Dispense:  90 tablet    Refill:  1    Order Specific Question:  Supervising Provider    Answer:  Chipper Herb [[id number]]  . valsartan-hydrochlorothiazide (DIOVAN-HCT) 320-25 MG per tablet    Sig: TAKE 1 TABLET DAILY    Dispense:  90 tablet    Refill:  1    Order Specific Question:  Supervising Provider    Answer:  Chipper Herb [[id number]]  . glucose blood (ONE TOUCH ULTRA TEST) test strip    Sig: CHECK BLOOD SUGAR 3 TIMES A DAY    Dispense:  300 each    Refill:  1     Dx 250.00    Order Specific Question:  Supervising Provider    Answer:  Chipper Herb [[id number]]    Labs pending Health maintenance reviewed Diet and exercise encouraged Continue all meds Follow up  In 3 months  [place], FNP

## 2014-04-29 LAB — NMR, LIPOPROFILE
Cholesterol: 120 mg/dL (ref 100–199)
HDL Cholesterol by NMR: 41 mg/dL (ref >39)
HDL Particle Number: 30.2 umol/L — ABNORMAL LOW (ref >=30.5)
LDL Particle Number: 656 nmol/L (ref <1000)
LDL SIZE: 19.7 nm (ref >20.5)
LDLC SERPL CALC-MCNC: 42 mg/dL (ref 0–99)
LP-IR SCORE: 57 — AB (ref <=45)
Small LDL Particle Number: 551 nmol/L — ABNORMAL HIGH (ref <=527)
Triglycerides by NMR: 187 mg/dL — ABNORMAL HIGH (ref 0–149)

## 2014-04-29 LAB — CMP14+EGFR
A/G RATIO: 1.6 (ref 1.1–2.5)
ALK PHOS: 62 IU/L (ref 39–117)
ALT: 9 IU/L (ref 0–32)
AST: 12 IU/L (ref 0–40)
Albumin: 4.4 g/dL (ref 3.5–4.8)
BILIRUBIN TOTAL: 0.5 mg/dL (ref 0.0–1.2)
BUN / CREAT RATIO: 12 (ref 11–26)
BUN: 13 mg/dL (ref 8–27)
CHLORIDE: 95 mmol/L — AB (ref 97–108)
CO2: 23 mmol/L (ref 18–29)
Calcium: 10.2 mg/dL (ref 8.7–10.3)
Creatinine, Ser: 1.1 mg/dL — ABNORMAL HIGH (ref 0.57–1.00)
GFR, EST AFRICAN AMERICAN: 56 mL/min/{1.73_m2} — AB (ref >59)
GFR, EST NON AFRICAN AMERICAN: 48 mL/min/{1.73_m2} — AB (ref >59)
GLUCOSE: 86 mg/dL (ref 65–99)
Globulin, Total: 2.8 g/dL (ref 1.5–4.5)
POTASSIUM: 3.9 mmol/L (ref 3.5–5.2)
SODIUM: 137 mmol/L (ref 134–144)
TOTAL PROTEIN: 7.2 g/dL (ref 6.0–8.5)

## 2014-05-02 ENCOUNTER — Telehealth: Payer: Self-pay | Admitting: Family Medicine

## 2014-05-02 NOTE — Telephone Encounter (Signed)
Message copied by Waverly Ferrari on Mon May 02, 2014 11:51 AM ------      Message from: [name]      Created: Sun May 01, 2014  8:56 PM       hemoglobin      Kidney and liver function stable      Cholesterol looks great      Continue current meds- low fat diet and exercise and recheck in 3 months             ------

## 2014-05-31 ENCOUNTER — Other Ambulatory Visit: Payer: Self-pay | Admitting: Nurse Practitioner

## 2014-06-13 ENCOUNTER — Other Ambulatory Visit: Payer: Self-pay | Admitting: Nurse Practitioner

## 2014-06-27 ENCOUNTER — Other Ambulatory Visit: Payer: Self-pay | Admitting: Nurse Practitioner

## 2014-06-28 NOTE — Telephone Encounter (Signed)
Please call in lorazepam 0.5 mg 1 po BD prn #60 with 1 refills

## 2014-06-28 NOTE — Telephone Encounter (Signed)
Rx called to [facility]

## 2014-06-28 NOTE — Telephone Encounter (Signed)
Patient last seen in office on 04-27-14. Rx last filled on 06-01-14 for #[id number]. Please advise. If approved please route to pool B so nurse can phone in to pharmacy

## 2014-07-27 ENCOUNTER — Ambulatory Visit (INDEPENDENT_AMBULATORY_CARE_PROVIDER_SITE_OTHER): Payer: Medicare Other | Admitting: Pharmacist

## 2014-07-27 ENCOUNTER — Encounter: Payer: Self-pay | Admitting: Pharmacist

## 2014-07-27 ENCOUNTER — Ambulatory Visit (INDEPENDENT_AMBULATORY_CARE_PROVIDER_SITE_OTHER): Payer: Medicare Other

## 2014-07-27 VITALS — BP 142/70 | HR 68 | Ht 61.0 in | Wt 127.0 lb

## 2014-07-27 DIAGNOSIS — N183 Chronic kidney disease, stage 3 (moderate): Secondary | ICD-10-CM

## 2014-07-27 DIAGNOSIS — Z8639 Personal history of other endocrine, nutritional and metabolic disease: Secondary | ICD-10-CM

## 2014-07-27 DIAGNOSIS — Z0000 Encounter for general adult medical examination without abnormal findings: Secondary | ICD-10-CM

## 2014-07-27 DIAGNOSIS — N1831 Chronic kidney disease, stage 3a: Secondary | ICD-10-CM | POA: Insufficient documentation

## 2014-07-27 DIAGNOSIS — Z862 Personal history of diseases of the blood and blood-forming organs and certain disorders involving the immune mechanism: Secondary | ICD-10-CM

## 2014-07-27 DIAGNOSIS — H9193 Unspecified hearing loss, bilateral: Secondary | ICD-10-CM

## 2014-07-27 DIAGNOSIS — Z13820 Encounter for screening for osteoporosis: Secondary | ICD-10-CM

## 2014-07-27 DIAGNOSIS — N1832 Chronic kidney disease, stage 3b: Secondary | ICD-10-CM | POA: Insufficient documentation

## 2014-07-27 LAB — HM DEXA SCAN: HM DEXA SCAN: NORMAL

## 2014-07-27 NOTE — Progress Notes (Signed)
Patient ID: Kara Dean, female   DOB: 1933-02-26, 80 y.o.   MRN: [id number]  Subjective:    Kara Dean is a 80 y.o. female who presents for Medicare Initial preventive examination and review DEXA results.  Preventive Screening-Counseling & Management  Tobacco History  Smoking status  . Never Smoker   Smokeless tobacco  . Never Used     Current Problems (verified) Patient Active Problem List   Diagnosis Date Noted  . CKD stage G3a/A3, GFR 45 - 59 and albumin creatinine ratio >300 mg/g 07/27/2014  . Gastroesophageal reflux disease without esophagitis 04/27/2014  . GAD (generalized anxiety disorder) 04/27/2014  . OCCLUSION&STENOSIS CAROTID ARTERY W/INFARCT 03/26/2010  . CHEST PAIN UNSPECIFIED 09/05/2009  . CHEST WALL INJURY 09/05/2009  . DIZZINESS 02/07/2009  . DYSLIPIDEMIA 02/01/2009  . DEPRESSION 02/01/2009  . HYPERTENSION 02/01/2009  . CVA 02/01/2009  . OSTEOARTHRITIS 02/01/2009  . DIABETES MELLITUS, TYPE II, HX OF 02/01/2009  . TRANSIENT ISCHEMIC ATTACKS, HX OF 02/01/2009    Medications Prior to Visit Current Outpatient Prescriptions on File Prior to Visit  Medication Sig Dispense Refill  . B Complex Vitamins (B COMPLEX-B12) TABS Take 1 tablet by mouth daily.        Marland Kitchen dipyridamole-aspirin (AGGRENOX) 200-25 MG per 12 hr capsule Take 1 capsule by mouth 2 (two) times daily.  180 capsule  1  . esomeprazole (NEXIUM) 40 MG capsule Take 1 capsule (40 mg total) by mouth daily at 12 noon.  90 capsule  1  . glipiZIDE (GLIPIZIDE XL) 10 MG 24 hr tablet Take 1 tablet (10 mg total) by mouth daily with breakfast.  90 tablet  1  . glucose blood (ONE TOUCH ULTRA TEST) test strip CHECK BLOOD SUGAR 3 TIMES A DAY  300 each  1  . LORazepam (ATIVAN) 0.5 MG tablet TAKE  (1)  TABLET TWICE A DAY.  60 tablet  1  . Magnesium 250 MG TABS Take 1 tablet by mouth daily.        . metoprolol succinate (TOPROL-XL) 100 MG 24 hr tablet TAKE 1 TABLET ONCE A DAY WITH A MEAL  90 tablet  1  . omega-3  fish oil (MAXEPA) 1000 MG CAPS capsule Take 1 capsule by mouth daily.        . valsartan-hydrochlorothiazide (DIOVAN-HCT) 320-25 MG per tablet TAKE 1 TABLET DAILY  90 tablet  1  . vitamin D, CHOLECALCIFEROL, 400 UNITS tablet Take 400 Units by mouth daily.         No current facility-administered medications on file prior to visit.    Current Medications (verified) Current Outpatient Prescriptions  Medication Sig Dispense Refill  . amLODipine (NORVASC) 10 MG tablet Take 10 mg by mouth daily. TAKE 1 TABLET DAILY      . atorvastatin (LIPITOR) 10 MG tablet Take 10 mg by mouth every evening. TAKE 1 TABLET ONCE DAILY AT 6PM      . B Complex Vitamins (B COMPLEX-B12) TABS Take 1 tablet by mouth daily.        Marland Kitchen dipyridamole-aspirin (AGGRENOX) 200-25 MG per 12 hr capsule Take 1 capsule by mouth 2 (two) times daily.  180 capsule  1  . esomeprazole (NEXIUM) 40 MG capsule Take 1 capsule (40 mg total) by mouth daily at 12 noon.  90 capsule  1  . glipiZIDE (GLIPIZIDE XL) 10 MG 24 hr tablet Take 1 tablet (10 mg total) by mouth daily with breakfast.  90 tablet  1  . glucose blood (ONE TOUCH ULTRA  TEST) test strip CHECK BLOOD SUGAR 3 TIMES A DAY  300 each  1  . insulin glargine (LANTUS) 100 UNIT/ML injection Inject 0.35 mLs (35 Units total) into the skin every morning.  10 mL  3  . LORazepam (ATIVAN) 0.5 MG tablet TAKE  (1)  TABLET TWICE A DAY.  60 tablet  1  . Magnesium 250 MG TABS Take 1 tablet by mouth daily.        . metoprolol succinate (TOPROL-XL) 100 MG 24 hr tablet TAKE 1 TABLET ONCE A DAY WITH A MEAL  90 tablet  1  . omega-3 fish oil (MAXEPA) 1000 MG CAPS capsule Take 1 capsule by mouth daily.        . valsartan-hydrochlorothiazide (DIOVAN-HCT) 320-25 MG per tablet TAKE 1 TABLET DAILY  90 tablet  1  . vitamin D, CHOLECALCIFEROL, 400 UNITS tablet Take 400 Units by mouth daily.         No current facility-administered medications for this visit.     Allergies (verified) Codeine; Latex; Morphine;  Niacin; Sulfa antibiotics; and Sulfonamide derivatives   PAST HISTORY  Family History Family History  Problem Relation Age of Onset  . Coronary artery disease Sister   . Seizures Sister   . Coronary artery disease Brother   . Hypertension Mother   . Stroke Mother   . Emphysema Father   . Alcohol abuse Brother   . Lung disease Brother     Social History History  Substance Use Topics  . Smoking status: Never Smoker   . Smokeless tobacco: Never Used  . Alcohol Use: No     Are there smokers in your home (other than you)? No  Risk Factors Current exercise habits: The patient does not participate in regular exercise at present.  Dietary issues discussed: none   Cardiac risk factors: advanced age (older than 16 for men, 51 for women), diabetes mellitus, dyslipidemia and sedentary lifestyle.  Depression Screen (Note: if answer to either of the following is "Yes", a more complete depression screening is indicated)   Over the past 2 weeks, have you felt down, depressed or hopeless? Yes  Over the past 2 weeks, have you felt little interest or pleasure in doing things? Yes  Have you lost interest or pleasure in daily life? No  Do you often feel hopeless? No  Do you cry easily over simple problems? No  Activities of Daily Living In your present state of health, do you have any difficulty performing the following activities?:  Driving? No Managing money?  No Feeding yourself? No Getting from bed to chair? No  Climbing a flight of stairs? Yes Preparing food and eating?: No Bathing or showering? No Getting dressed: No Getting to the toilet? No Using the toilet:No Moving around from place to place: No In the past year have you fallen or had a near fall?:No   Are you sexually active?  No  Do you have more than one partner?  No  Hearing Difficulties: Yes - past history of working in loud environment Do you often ask people to speak up or repeat themselves? Yes Do you  experience ringing or noises in your ears? Yes Do you have difficulty understanding soft or whispered voices? Yes   Do you feel that you have a problem with memory? Yes  Do you often misplace items? No  Do you feel safe at home?  Yes  Cognitive Testing  Alert? Yes  Normal Appearance?Yes  Oriented to person? Yes  Place? Yes  Time? Yes  Recall of three objects?  No - only 1 of 3  Can perform simple calculations? Yes  Displays appropriate judgment?Yes  Can read the correct time from a watch face?Yes   Advanced Directives have been discussed with the patient? Yes  List the Names of Other Physician/Practitioners you currently use: 1.  Dr Burt Knack - Cardiologist 2.  Dr Hassell Done - optometrist  Indicate any recent Medical Services you may have received from other than [facility] providers in the past year (date may be approximate).  Immunization History  Administered Date(s) Administered  . Influenza Split 07/23/2013  . Influenza-Unspecified 07/18/2014  . Pneumococcal Conjugate-13 07/18/2014  . Pneumococcal Polysaccharide-23 08/04/2008  . Td 06/11/2005    Screening Tests Health Maintenance  Topic Date Due  . Urine Microalbumin  09/20/2014  . Hemoglobin A1c  10/27/2014  . Ophthalmology Exam  01/13/2015  . Foot Exam  04/28/2015  . Influenza Vaccine  06/05/2015  . Tetanus/tdap  06/12/2015  . Colonoscopy  03/04/2021  . Pneumococcal Polysaccharide Vaccine Age 11 And Over  Completed  . Zostavax  Completed   In questioning patient she states that she has episodes of hypoglycemia about 2 times per week.  Usually occurs in the early am and lowest has been in the 50's.  She is currently taking glipizide XL 93m 1 tablet daily and Lantus 35 units qpm.  All answers were reviewed with the patient and necessary referrals were made:  [facility]  [facility]27[facility]   History reviewed: allergies, current medications, past family history, past medical history, past social history, past surgical  history and problem list   Objective:  Body mass index is 24.01 kg/(m^2). BP 142/70  Pulse 68  Ht 5' 1"  (1.549 m)  Wt 127 lb (57.607 kg)  BMI 24.01 kg/m2   DEXA Results: Date T-Score for L1-L4 T-Score for Total left Hip T-Score for Total Right Hip  07/27/2014 0.6 0.2 0.4  07/01/2005 -- 1.2 --      Assessment:   Initial Medicare Wellness Visit Type 2 Diabetes with reports of hypoglycemia Normal BMD   Plan:     During the course of the visit the patient was educated and counseled about appropriate screening and preventive services including:    Pneumococcal vaccine - UTD  Influenza vaccine - UTD  Td vaccine - UTD  Screening mammography - needed appt scheduled today in office for 08/03/14  Bone densitometry screening - done today  Colorectal cancer screening - UTD  Glaucoma screening - eye exam needed - referral made today  Nutrition counseling - discussed CHO counting briefly today  Advanced directives: no AD on file - patient given [facility] packet.  Audiologist - referral sent today  Change Lantus to qam to see if early am lows improve.  Patient is instructed to call office for adjustment if continues to have BG readings less than 70 or greater than 200 more than once per week.   Zostavax - completed  Suggested Silver Sneakers balance classes for patinet  Made appt to follow up with PCP 08/2014  Patient Instructions (the written plan) was given to the patient.  Medicare Attestation I have personally reviewed: The patient's medical and social history Their use of alcohol, tobacco or illicit drugs Their current medications and supplements The patient's functional ability including ADLs,fall risks, home safety risks, cognitive, and hearing and visual impairment Diet and physical activities Evidence for depression or mood disorders  The patient's weight, height, BMI, and BP/HR have been recorded  in the chart.  I have made referrals, counseling,  and provided education to the patient based on review of the above and I have provided the patient with a written personalized care plan for preventive services.     Kara Dean, [facility]   07/27/2014

## 2014-07-27 NOTE — Patient Instructions (Addendum)
Health Maintenance Summary    URINE MICROALBUMIN Next Due 09/20/2014      HEMOGLOBIN A1C Next Due 10/27/2014      OPHTHALMOLOGY EXAM Due Now  Will send referral for you to see Dr Hassell Done    FOOT EXAM Next Due 04/28/2015      INFLUENZA VACCINE Next Due 06/05/2015  Last done 07/2014    TETANUS/TDAP Next Due 06/12/2015  Last done 06/11/2005   Pneumonia vaccine Completed     Zostavax - shingles Completed     Mammogram Due Now     Dexa / Bone Density  Done today Normal     COLONOSCOPY Next Due 03/04/2021  Last done 03/05/2011      Consider Silver Sneakers program at [facility] for balance classes Change Lantus to 35 units each MORNING.  Call office if you continue to have blood glucose reading less than 70 or greater than 200 for adjustment in diabetes medications.    Preventive Care for Adults A healthy lifestyle and preventive care can promote health and wellness. Preventive health guidelines for women include the following key practices.  A routine yearly physical is a good way to check with your health care provider about your health and preventive screening. It is a chance to share any concerns and updates on your health and to receive a thorough exam.  Visit your dentist for a routine exam and preventive care every 6 months. Brush your teeth twice a day and floss once a day. Good oral hygiene prevents tooth decay and gum disease.  The frequency of eye exams is based on your age, health, family medical history, use of contact lenses, and other factors. Follow your health care provider's recommendations for frequency of eye exams.  Eat a healthy diet. Foods like vegetables, fruits, whole grains, low-fat dairy products, and lean protein foods contain the nutrients you need without too many calories. Decrease your intake of foods high in solid fats, added sugars, and salt. Eat the right amount of calories for you.Get information about a proper diet from your health care provider, if necessary.  Regular  physical exercise is one of the most important things you can do for your health. Most adults should get at least 150 minutes of moderate-intensity exercise (any activity that increases your heart rate and causes you to sweat) each week. In addition, most adults need muscle-strengthening exercises on 2 or more days a week.  Maintain a healthy weight. The body mass index (BMI) is a screening tool to identify possible weight problems. It provides an estimate of body fat based on height and weight. Your health care provider can find your BMI and can help you achieve or maintain a healthy weight.For adults 20 years and older:  A BMI below 18.5 is considered underweight.  A BMI of 18.5 to 24.9 is normal.  A BMI of 25 to 29.9 is considered overweight.  A BMI of 30 and above is considered obese.  Maintain normal blood lipids and cholesterol levels by exercising and minimizing your intake of saturated fat. Eat a balanced diet with plenty of fruit and vegetables. Blood tests for lipids and cholesterol should begin at age 30 and be repeated every 5 years. If your lipid or cholesterol levels are high, you are over 50, or you are at high risk for heart disease, you may need your cholesterol levels checked more frequently.Ongoing high lipid and cholesterol levels should be treated with medicines if diet and exercise are not working.  If you smoke,  find out from your health care provider how to quit. If you do not use tobacco, do not start.  Lung cancer screening is recommended for adults aged 23-80 years who are at high risk for developing lung cancer because of a history of smoking. A yearly low-dose CT scan of the lungs is recommended for people who have at least a 30-pack-year history of smoking and are a current smoker or have quit within the past 15 years. A pack year of smoking is smoking an average of 1 pack of cigarettes a day for 1 year (for example: 1 pack a day for 30 years or 2 packs a day for 15  years). Yearly screening should continue until the smoker has stopped smoking for at least 15 years. Yearly screening should be stopped for people who develop a health problem that would prevent them from having lung cancer treatment.  If you are pregnant, do not drink alcohol. If you are breastfeeding, be very cautious about drinking alcohol. If you are not pregnant and choose to drink alcohol, do not have more than 1 drink per day. One drink is considered to be 12 ounces (355 mL) of beer, 5 ounces (148 mL) of wine, or 1.5 ounces (44 mL) of liquor.  Avoid use of street drugs. Do not share needles with anyone. Ask for help if you need support or instructions about stopping the use of drugs.  High blood pressure causes heart disease and increases the risk of stroke. Your blood pressure should be checked at least every 1 to 2 years. Ongoing high blood pressure should be treated with medicines if weight loss and exercise do not work.  If you are 38-31 years old, ask your health care provider if you should take aspirin to prevent strokes.  Diabetes screening involves taking a blood sample to check your fasting blood sugar level. This should be done once every 3 years, after age 50, if you are within normal weight and without risk factors for diabetes. Testing should be considered at a younger age or be carried out more frequently if you are overweight and have at least 1 risk factor for diabetes.  Breast cancer screening is essential preventive care for women. You should practice "breast self-awareness." This means understanding the normal appearance and feel of your breasts and may include breast self-examination. Any changes detected, no matter how small, should be reported to a health care provider. Women in their 28s and 30s should have a clinical breast exam (CBE) by a health care provider as part of a regular health exam every 1 to 3 years. After age 55, women should have a CBE every year. Starting at  age 18, women should consider having a mammogram (breast X-ray test) every year. Women who have a family history of breast cancer should talk to their health care provider about genetic screening. Women at a high risk of breast cancer should talk to their health care providers about having an MRI and a mammogram every year.  Breast cancer gene (BRCA)-related cancer risk assessment is recommended for women who have family members with BRCA-related cancers. BRCA-related cancers include breast, ovarian, tubal, and peritoneal cancers. Having family members with these cancers may be associated with an increased risk for harmful changes (mutations) in the breast cancer genes BRCA1 and BRCA2. Results of the assessment will determine the need for genetic counseling and BRCA1 and BRCA2 testing.  Routine pelvic exams to screen for cancer are no longer recommended for nonpregnant women who  are considered low risk for cancer of the pelvic organs (ovaries, uterus, and vagina) and who do not have symptoms. Ask your health care provider if a screening pelvic exam is right for you.  If you have had past treatment for cervical cancer or a condition that could lead to cancer, you need Pap tests and screening for cancer for at least 20 years after your treatment. If Pap tests have been discontinued, your risk factors (such as having a new sexual partner) need to be reassessed to determine if screening should be resumed. Some women have medical problems that increase the chance of getting cervical cancer. In these cases, your health care provider may recommend more frequent screening and Pap tests.  The HPV test is an additional test that may be used for cervical cancer screening. The HPV test looks for the virus that can cause the cell changes on the cervix. The cells collected during the Pap test can be tested for HPV. The HPV test could be used to screen women aged 46 years and older, and should be used in women of any age  who have unclear Pap test results. After the age of 33, women should have HPV testing at the same frequency as a Pap test.  Colorectal cancer can be detected and often prevented. Most routine colorectal cancer screening begins at the age of 80 years and continues through age 91 years. However, your health care provider may recommend screening at an earlier age if you have risk factors for colon cancer. On a yearly basis, your health care provider may provide home test kits to check for hidden blood in the stool. Use of a small camera at the end of a tube, to directly examine the colon (sigmoidoscopy or colonoscopy), can detect the earliest forms of colorectal cancer. Talk to your health care provider about this at age 36, when routine screening begins. Direct exam of the colon should be repeated every 5-10 years through age 28 years, unless early forms of pre-cancerous polyps or small growths are found.  People who are at an increased risk for hepatitis B should be screened for this virus. You are considered at high risk for hepatitis B if:  You were born in a country where hepatitis B occurs often. Talk with your health care provider about which countries are considered high risk.  Your parents were born in a high-risk country and you have not received a shot to protect against hepatitis B (hepatitis B vaccine).  You have HIV or AIDS.  You use needles to inject street drugs.  You live with, or have sex with, someone who has hepatitis B.  You get hemodialysis treatment.  You take certain medicines for conditions like cancer, organ transplantation, and autoimmune conditions.  Hepatitis C blood testing is recommended for all people born from [birth year] through 1965 and any individual with known risks for hepatitis C.  Practice safe sex. Use condoms and avoid high-risk sexual practices to reduce the spread of sexually transmitted infections (STIs). STIs include gonorrhea, chlamydia, syphilis,  trichomonas, herpes, HPV, and human immunodeficiency virus (HIV). Herpes, HIV, and HPV are viral illnesses that have no cure. They can result in disability, cancer, and death.  You should be screened for sexually transmitted illnesses (STIs) including gonorrhea and chlamydia if:  You are sexually active and are younger than 24 years.  You are older than 24 years and your health care provider tells you that you are at risk for this type of infection.  Your sexual activity has changed since you were last screened and you are at an increased risk for chlamydia or gonorrhea. Ask your health care provider if you are at risk.  If you are at risk of being infected with HIV, it is recommended that you take a prescription medicine daily to prevent HIV infection. This is called preexposure prophylaxis (PrEP). You are considered at risk if:  You are a heterosexual woman, are sexually active, and are at increased risk for HIV infection.  You take drugs by injection.  You are sexually active with a partner who has HIV.  Talk with your health care provider about whether you are at high risk of being infected with HIV. If you choose to begin PrEP, you should first be tested for HIV. You should then be tested every 3 months for as long as you are taking PrEP.  Osteoporosis is a disease in which the bones lose minerals and strength with aging. This can result in serious bone fractures or breaks. The risk of osteoporosis can be identified using a bone density scan. Women ages 20 years and over and women at risk for fractures or osteoporosis should discuss screening with their health care providers. Ask your health care provider whether you should take a calcium supplement or vitamin D to reduce the rate of osteoporosis.  Menopause can be associated with physical symptoms and risks. Hormone replacement therapy is available to decrease symptoms and risks. You should talk to your health care provider about whether  hormone replacement therapy is right for you.  Use sunscreen. Apply sunscreen liberally and repeatedly throughout the day. You should seek shade when your shadow is shorter than you. Protect yourself by wearing long sleeves, pants, a wide-brimmed hat, and sunglasses year round, whenever you are outdoors.  Once a month, do a whole body skin exam, using a mirror to look at the skin on your back. Tell your health care provider of new moles, moles that have irregular borders, moles that are larger than a pencil eraser, or moles that have changed in shape or color.  Stay current with required vaccines (immunizations).  Influenza vaccine. All adults should be immunized every year.  Tetanus, diphtheria, and acellular pertussis (Td, Tdap) vaccine. Pregnant women should receive 1 dose of Tdap vaccine during each pregnancy. The dose should be obtained regardless of the length of time since the last dose. Immunization is preferred during the 27th-36th week of gestation. An adult who has not previously received Tdap or who does not know her vaccine status should receive 1 dose of Tdap. This initial dose should be followed by tetanus and diphtheria toxoids (Td) booster doses every 10 years. Adults with an unknown or incomplete history of completing a 3-dose immunization series with Td-containing vaccines should begin or complete a primary immunization series including a Tdap dose. Adults should receive a Td booster every 10 years.  Varicella vaccine. An adult without evidence of immunity to varicella should receive 2 doses or a second dose if she has previously received 1 dose. Pregnant females who do not have evidence of immunity should receive the first dose after pregnancy. This first dose should be obtained before leaving the health care facility. The second dose should be obtained 4-8 weeks after the first dose.  Human papillomavirus (HPV) vaccine. Females aged 13-26 years who have not received the vaccine  previously should obtain the 3-dose series. The vaccine is not recommended for use in pregnant females. However, pregnancy testing is not needed  before receiving a dose. If a female is found to be pregnant after receiving a dose, no treatment is needed. In that case, the remaining doses should be delayed until after the pregnancy. Immunization is recommended for any person with an immunocompromised condition through the age of 65 years if she did not get any or all doses earlier. During the 3-dose series, the second dose should be obtained 4-8 weeks after the first dose. The third dose should be obtained 24 weeks after the first dose and 16 weeks after the second dose.  Zoster vaccine. One dose is recommended for adults aged 55 years or older unless certain conditions are present.  Measles, mumps, and rubella (MMR) vaccine. Adults born before [birth year] generally are considered immune to measles and mumps. Adults born in [birth year] or later should have 1 or more doses of MMR vaccine unless there is a contraindication to the vaccine or there is laboratory evidence of immunity to each of the three diseases. A routine second dose of MMR vaccine should be obtained at least 28 days after the first dose for students attending postsecondary schools, health care workers, or international travelers. People who received inactivated measles vaccine or an unknown type of measles vaccine during 1963-1967 should receive 2 doses of MMR vaccine. People who received inactivated mumps vaccine or an unknown type of mumps vaccine before 1979 and are at high risk for mumps infection should consider immunization with 2 doses of MMR vaccine. For females of childbearing age, rubella immunity should be determined. If there is no evidence of immunity, females who are not pregnant should be vaccinated. If there is no evidence of immunity, females who are pregnant should delay immunization until after pregnancy. Unvaccinated health care workers born  before [birth year] who lack laboratory evidence of measles, mumps, or rubella immunity or laboratory confirmation of disease should consider measles and mumps immunization with 2 doses of MMR vaccine or rubella immunization with 1 dose of MMR vaccine.  Pneumococcal 13-valent conjugate (PCV13) vaccine. When indicated, a person who is uncertain of her immunization history and has no record of immunization should receive the PCV13 vaccine. An adult aged 65 years or older who has certain medical conditions and has not been previously immunized should receive 1 dose of PCV13 vaccine. This PCV13 should be followed with a dose of pneumococcal polysaccharide (PPSV23) vaccine. The PPSV23 vaccine dose should be obtained at least 8 weeks after the dose of PCV13 vaccine. An adult aged 66 years or older who has certain medical conditions and previously received 1 or more doses of PPSV23 vaccine should receive 1 dose of PCV13. The PCV13 vaccine dose should be obtained 1 or more years after the last PPSV23 vaccine dose.  Pneumococcal polysaccharide (PPSV23) vaccine. When PCV13 is also indicated, PCV13 should be obtained first. All adults aged 25 years and older should be immunized. An adult younger than age 62 years who has certain medical conditions should be immunized. Any person who resides in a nursing home or long-term care facility should be immunized. An adult smoker should be immunized. People with an immunocompromised condition and certain other conditions should receive both PCV13 and PPSV23 vaccines. People with human immunodeficiency virus (HIV) infection should be immunized as soon as possible after diagnosis. Immunization during chemotherapy or radiation therapy should be avoided. Routine use of PPSV23 vaccine is not recommended for American Indians, [place] Natives, or people younger than 65 years unless there are medical conditions that require PPSV23 vaccine. When indicated, people who have  unknown immunization and  have no record of immunization should receive PPSV23 vaccine. One-time revaccination 5 years after the first dose of PPSV23 is recommended for people aged 19-64 years who have chronic kidney failure, nephrotic syndrome, asplenia, or immunocompromised conditions. People who received 1-2 doses of PPSV23 before age 53 years should receive another dose of PPSV23 vaccine at age 55 years or later if at least 5 years have passed since the previous dose. Doses of PPSV23 are not needed for people immunized with PPSV23 at or after age 83 years.  Meningococcal vaccine. Adults with asplenia or persistent complement component deficiencies should receive 2 doses of quadrivalent meningococcal conjugate (MenACWY-D) vaccine. The doses should be obtained at least 2 months apart. Microbiologists working with certain meningococcal bacteria, [employer], people at risk during an outbreak, and people who travel to or live in countries with a high rate of meningitis should be immunized. A first-year college student up through age 101 years who is living in a residence hall should receive a dose if she did not receive a dose on or after her 16th birthday. Adults who have certain high-risk conditions should receive one or more doses of vaccine.  Hepatitis A vaccine. Adults who wish to be protected from this disease, have certain high-risk conditions, work with hepatitis A-infected animals, work in hepatitis A research labs, or travel to or work in countries with a high rate of hepatitis A should be immunized. Adults who were previously unvaccinated and who anticipate close contact with an international adoptee during the first 60 days after arrival in the [place] from a country with a high rate of hepatitis A should be immunized.  Hepatitis B vaccine. Adults who wish to be protected from this disease, have certain high-risk conditions, may be exposed to blood or other infectious body fluids, are household contacts or sex  partners of hepatitis B positive people, are clients or workers in certain care facilities, or travel to or work in countries with a high rate of hepatitis B should be immunized.

## 2014-08-02 ENCOUNTER — Other Ambulatory Visit: Payer: Self-pay | Admitting: Nurse Practitioner

## 2014-08-11 ENCOUNTER — Other Ambulatory Visit: Payer: Self-pay | Admitting: Nurse Practitioner

## 2014-08-15 ENCOUNTER — Encounter: Payer: Self-pay | Admitting: Family Medicine

## 2014-08-15 ENCOUNTER — Telehealth: Payer: Self-pay | Admitting: Nurse Practitioner

## 2014-08-15 ENCOUNTER — Ambulatory Visit (INDEPENDENT_AMBULATORY_CARE_PROVIDER_SITE_OTHER): Payer: Medicare Other | Admitting: Family Medicine

## 2014-08-15 VITALS — BP 127/59 | HR 60 | Temp 97.7°F | Ht 61.0 in | Wt 129.0 lb

## 2014-08-15 DIAGNOSIS — L03115 Cellulitis of right lower limb: Secondary | ICD-10-CM

## 2014-08-15 DIAGNOSIS — Z23 Encounter for immunization: Secondary | ICD-10-CM

## 2014-08-15 MED ORDER — DOXYCYCLINE HYCLATE 100 MG PO TABS: 100.0000 mg | ORAL_TABLET | Freq: Two times a day (BID) | ORAL | Status: DC

## 2014-08-15 NOTE — Progress Notes (Signed)
   Subjective:    Patient ID: Kara Dean, female    DOB: 1933-05-31, 80 y.o.   MRN: [id number]  HPI [place] yesterday and has skin tear and abrasion right shin.   Review of Systems No chest pain, SOB, HA, dizziness, vision change, N/V, diarrhea, constipation, dysuria, urinary urgency or frequency, myalgias, arthralgias or rash.     Objective:   Physical Exam  Vital signs noted  Well developed well nourished female.  HEENT - Head atraumatic Normocephalic Respiratory - Lungs CTA bilateral Cardiac - RRR S1 and S2 without murmur Skin - Right shin with skin tear and ecchymosis.      Assessment & Plan:  Cellulitis of leg, right - Plan: doxycycline (VIBRA-TABS) 100 MG tablet Po bid x 10 days.  Use neosporin ointment otc on right shin abrasion and skin tear.  Lysbeth Penner FNP

## 2014-08-15 NOTE — Telephone Encounter (Signed)
Appt given for today 

## 2014-08-24 ENCOUNTER — Other Ambulatory Visit: Payer: Self-pay | Admitting: Nurse Practitioner

## 2014-08-25 NOTE — Telephone Encounter (Signed)
Last seen 08/15/14  B [place]  If approved route to nurse to call into [facility]

## 2014-09-02 ENCOUNTER — Encounter: Payer: Self-pay | Admitting: Nurse Practitioner

## 2014-09-02 ENCOUNTER — Ambulatory Visit (INDEPENDENT_AMBULATORY_CARE_PROVIDER_SITE_OTHER): Payer: Medicare Other | Admitting: Nurse Practitioner

## 2014-09-02 VITALS — BP 136/88 | HR 54 | Temp 97.5°F | Ht 61.0 in | Wt 127.0 lb

## 2014-09-02 DIAGNOSIS — F32A Depression, unspecified: Secondary | ICD-10-CM

## 2014-09-02 DIAGNOSIS — F329 Major depressive disorder, single episode, unspecified: Secondary | ICD-10-CM

## 2014-09-02 DIAGNOSIS — E119 Type 2 diabetes mellitus without complications: Secondary | ICD-10-CM

## 2014-09-02 DIAGNOSIS — F411 Generalized anxiety disorder: Secondary | ICD-10-CM

## 2014-09-02 DIAGNOSIS — K219 Gastro-esophageal reflux disease without esophagitis: Secondary | ICD-10-CM

## 2014-09-02 DIAGNOSIS — Z794 Long term (current) use of insulin: Secondary | ICD-10-CM

## 2014-09-02 DIAGNOSIS — I10 Essential (primary) hypertension: Secondary | ICD-10-CM

## 2014-09-02 DIAGNOSIS — E785 Hyperlipidemia, unspecified: Secondary | ICD-10-CM

## 2014-09-02 LAB — POCT GLYCOSYLATED HEMOGLOBIN (HGB A1C): HEMOGLOBIN A1C: 7.4

## 2014-09-02 MED ORDER — PANTOPRAZOLE SODIUM 40 MG PO TBEC: 40.0000 mg | DELAYED_RELEASE_TABLET | Freq: Every day | ORAL | Status: DC

## 2014-09-02 NOTE — Patient Instructions (Signed)
Diabetes and Exercise Exercising regularly is important. It is not just about losing weight. It has many health benefits, such as:  Improving your overall fitness, flexibility, and endurance.  Increasing your bone density.  Helping with weight control.  Decreasing your body fat.  Increasing your muscle strength.  Reducing stress and tension.  Improving your overall health. People with diabetes who exercise gain additional benefits because exercise:  Reduces appetite.  Improves the body's use of blood sugar (glucose).  Helps lower or control blood glucose.  Decreases blood pressure.  Helps control blood lipids (such as cholesterol and triglycerides).  Improves the body's use of the hormone insulin by:  Increasing the body's insulin sensitivity.  Reducing the body's insulin needs.  Decreases the risk for heart disease because exercising:  Lowers cholesterol and triglycerides levels.  Increases the levels of good cholesterol (such as high-density lipoproteins [HDL]) in the body.  Lowers blood glucose levels. YOUR ACTIVITY PLAN  Choose an activity that you enjoy and set realistic goals. Your health care provider or diabetes educator can help you make an activity plan that works for you. Exercise regularly as directed by your health care provider. This includes:  Performing resistance training twice a week such as push-ups, sit-ups, lifting weights, or using resistance bands.  Performing 150 minutes of cardio exercises each week such as walking, running, or playing sports.  Staying active and spending no more than 90 minutes at one time being inactive. Even short bursts of exercise are good for you. Three 10-minute sessions spread throughout the day are just as beneficial as a single 30-minute session. Some exercise ideas include:  Taking the dog for a walk.  Taking the stairs instead of the elevator.  Dancing to your favorite song.  Doing an exercise  video.  Doing your favorite exercise with a friend. RECOMMENDATIONS FOR EXERCISING WITH TYPE 1 OR TYPE 2 DIABETES   Check your blood glucose before exercising. If blood glucose levels are greater than 240 mg/dL, check for urine ketones. Do not exercise if ketones are present.  Avoid injecting insulin into areas of the body that are going to be exercised. For example, avoid injecting insulin into:  The arms when playing tennis.  The legs when jogging.  Keep a record of:  Food intake before and after you exercise.  Expected peak times of insulin action.  Blood glucose levels before and after you exercise.  The type and amount of exercise you have done.  Review your records with your health care provider. Your health care provider will help you to develop guidelines for adjusting food intake and insulin amounts before and after exercising.  If you take insulin or oral hypoglycemic agents, watch for signs and symptoms of hypoglycemia. They include:  Dizziness.  Shaking.  Sweating.  Chills.  Confusion.  Drink plenty of water while you exercise to prevent dehydration or heat stroke. Body water is lost during exercise and must be replaced.  Talk to your health care provider before starting an exercise program to make sure it is safe for you. Remember, almost any type of activity is better than none. Document Released: 01/11/2004 Document Revised: 03/07/2014 Document Reviewed: 03/30/2013 ExitCare Patient Information 2015 [employer]. This information is not intended to replace advice given to you by your health care provider. Make sure you discuss any questions you have with your health care provider.  

## 2014-09-02 NOTE — Progress Notes (Signed)
Subjective:    Patient ID: Kara Dean, female    DOB: [birth year], 80 y.o.   MRN: [id number]  Patient presents today for followup of chronic problems. Patient states she feels well. No acute complaints.   Hypertension This is a chronic problem. The current episode started more than 1 year ago. The problem is unchanged (Monitors at home at least twice a week - usually 145/56). The problem is uncontrolled. Associated symptoms include palpitations (Intermittent - goes away with rest). Pertinent negatives include no chest pain, headaches, orthopnea, peripheral edema or shortness of breath. Risk factors for coronary artery disease include dyslipidemia, diabetes mellitus, sedentary lifestyle and post-menopausal state. Past treatments include calcium channel blockers and angiotensin blockers. The current treatment provides moderate improvement. Compliance problems include diet and exercise.  Hypertensive end-organ damage includes CVA (3 years ago). There is no history of angina.  Hyperlipidemia This is a chronic problem. The current episode started more than 1 year ago. The problem is uncontrolled. Recent lipid tests were reviewed and are variable. Exacerbating diseases include diabetes. Pertinent negatives include no chest pain, focal weakness, leg pain, myalgias or shortness of breath. Current antihyperlipidemic treatment includes statins. Compliance problems include adherence to diet and adherence to exercise.  Risk factors for coronary artery disease include diabetes mellitus and post-menopausal.  Diabetes She presents for her follow-up diabetic visit. She has type 2 diabetes mellitus. No MedicAlert identification noted. The initial diagnosis of diabetes was made 20 years ago. Her disease course has been fluctuating. Pertinent negatives for hypoglycemia include no headaches. Pertinent negatives for diabetes include no chest pain, no polydipsia, no polyphagia, no polyuria, no weakness and no weight loss.  Symptoms are stable. Diabetic complications include a CVA (3 years ago). Current diabetic treatment includes insulin injections and oral agent (monotherapy). She is compliant with treatment all of the time. She is currently taking insulin at bedtime. Rotation sites for injection include the abdominal wall. Her weight is fluctuating minimally. When asked about meal planning, she reported none. She has not had a previous visit with a dietician. She rarely participates in exercise. Her breakfast blood glucose is taken between 8-9 am. Her breakfast blood glucose range is generally 90-110 mg/dl. Her overall blood glucose range is 90-110 mg/dl. (Patient states when she takes 35 units of Lantus at night, blood sugars are low this morning (<70)) An ACE inhibitor/angiotensin II receptor blocker is not being taken. She does not see a podiatrist.Eye exam is not current (several years ago).  GAD Takes lorzepam as needed. Has not had a recent in a few weeks.  GERD No symptoms as long as taking Nexium.  Hx CVA Takes aggrenox- No bleeding   Review of Systems  Constitutional: Negative for weight loss.  Respiratory: Negative for shortness of breath.   Cardiovascular: Positive for palpitations (Intermittent - goes away with rest). Negative for chest pain and orthopnea.  Endocrine: Negative for polydipsia, polyphagia and polyuria.  Musculoskeletal: Negative for myalgias.  Neurological: Negative for focal weakness, weakness and headaches.  All other systems reviewed and are negative.      Objective:   Physical Exam  Constitutional: She is oriented to person, place, and time. She appears well-developed and well-nourished.  HENT:  Right Ear: External ear normal.  Left Ear: External ear normal.  Nose: Nose normal.  Mouth/Throat: Oropharynx is clear and moist.  Eyes: EOM are normal.  Neck: Trachea normal, normal range of motion and full passive range of motion without pain. Neck supple. No JVD present. Carotid  bruit is not present. No thyromegaly present.  Cardiovascular: Normal rate, regular rhythm, normal heart sounds and intact distal pulses.  Exam reveals no gallop and no friction rub.   No murmur heard. Pulmonary/Chest: Effort normal and breath sounds normal.  Abdominal: Soft. Bowel sounds are normal. She exhibits no distension and no mass. There is no tenderness.  Musculoskeletal: Normal range of motion.  Lymphadenopathy:    She has no cervical adenopathy.  Neurological: She is alert and oriented to person, place, and time. She has normal reflexes.  Skin: Skin is warm and dry.  Psychiatric: She has a normal mood and affect. Her behavior is normal. Judgment and thought content normal.    BP 164/65  Pulse 54  Temp(Src) 97.5 F (36.4 C) (Oral)  Ht 5' 1"  (1.549 m)  Wt 127 lb (57.607 kg)  BMI 24.01 kg/m2  Results for orders placed in visit on 09/02/14  POCT GLYCOSYLATED HEMOGLOBIN (HGB A1C)      Result Value Ref Range   Hemoglobin A1C 7.4          Assessment & Plan:   1. Essential hypertension Low NA+ diet - CMP14+EGFR - NMR, lipoprofile  2. Type 2 diabetes mellitus treated with insulin Watch carbs in diet - POCT glycosylated hemoglobin (Hb A1C)  3. GAD (generalized anxiety disorder) Stress management  4. Hyperlipidemia Low fat diet  5. Depression Stress amanegemnt  6. Gastroesophageal reflux disease without esophagitis Stopped nexium because ins would no  Longer pay for it - pantoprazole (PROTONIX) 40 MG tablet; Take 1 tablet (40 mg total) by mouth daily.  Dispense: 30 tablet; Refill: 5  Labs pending Health maintenance reviewed Diet and exercise encouraged Continue all meds Follow up  In [place], FNP  months

## 2014-09-03 LAB — NMR, LIPOPROFILE
Cholesterol: 110 mg/dL (ref 100–199)
HDL Cholesterol by NMR: 45 mg/dL (ref >39)
HDL Particle Number: 33.3 umol/L (ref >=30.5)
LDL Particle Number: 554 nmol/L (ref <1000)
LDL SIZE: 20 nm (ref >20.5)
LDL-C: 40 mg/dL (ref 0–99)
LP-IR SCORE: 52 — AB (ref <=45)
Small LDL Particle Number: 370 nmol/L (ref <=527)
Triglycerides by NMR: 124 mg/dL (ref 0–149)

## 2014-09-03 LAB — CMP14+EGFR
ALK PHOS: 67 IU/L (ref 39–117)
ALT: 11 IU/L (ref 0–32)
AST: 18 IU/L (ref 0–40)
Albumin/Globulin Ratio: 1.6 (ref 1.1–2.5)
Albumin: 4.4 g/dL (ref 3.5–4.8)
BILIRUBIN TOTAL: 0.4 mg/dL (ref 0.0–1.2)
BUN / CREAT RATIO: 7 — AB (ref 11–26)
BUN: 7 mg/dL — ABNORMAL LOW (ref 8–27)
CALCIUM: 9.8 mg/dL (ref 8.7–10.3)
CO2: 22 mmol/L (ref 18–29)
Chloride: 94 mmol/L — ABNORMAL LOW (ref 97–108)
Creatinine, Ser: 1 mg/dL (ref 0.57–1.00)
GFR, EST AFRICAN AMERICAN: 62 mL/min/{1.73_m2} (ref >59)
GFR, EST NON AFRICAN AMERICAN: 54 mL/min/{1.73_m2} — AB (ref >59)
GLOBULIN, TOTAL: 2.7 g/dL (ref 1.5–4.5)
Glucose: 102 mg/dL — ABNORMAL HIGH (ref 65–99)
POTASSIUM: 4.1 mmol/L (ref 3.5–5.2)
SODIUM: 136 mmol/L (ref 134–144)
Total Protein: 7.1 g/dL (ref 6.0–8.5)

## 2014-09-13 ENCOUNTER — Other Ambulatory Visit: Payer: Medicare Other

## 2014-09-13 NOTE — Addendum Note (Signed)
Addended by: Wyline Mood on: 09/13/2014 09:32 AM   Modules accepted: Orders

## 2014-09-26 ENCOUNTER — Other Ambulatory Visit: Payer: Medicare Other

## 2014-09-26 DIAGNOSIS — E119 Type 2 diabetes mellitus without complications: Secondary | ICD-10-CM

## 2014-09-26 DIAGNOSIS — Z794 Long term (current) use of insulin: Principal | ICD-10-CM

## 2014-09-27 ENCOUNTER — Telehealth: Payer: Self-pay

## 2014-09-27 LAB — MICROALBUMIN, URINE: MICROALBUM., U, RANDOM: 22.6 ug/mL — AB (ref 0.0–17.0)

## 2014-09-27 NOTE — Telephone Encounter (Signed)
-----   Message from [name], [place] sent at 09/27/2014  3:14 PM EST ----- Slightly elevated- on ARB- should help protect kidneys

## 2014-09-27 NOTE — Telephone Encounter (Signed)
Letter sent with results

## 2014-10-19 ENCOUNTER — Other Ambulatory Visit: Payer: Self-pay | Admitting: Nurse Practitioner

## 2014-10-19 NOTE — Telephone Encounter (Signed)
Per protocol, next visit in Feb

## 2014-10-24 ENCOUNTER — Other Ambulatory Visit: Payer: Self-pay | Admitting: Family Medicine

## 2014-10-24 ENCOUNTER — Other Ambulatory Visit: Payer: Self-pay | Admitting: Nurse Practitioner

## 2014-10-24 NOTE — Telephone Encounter (Signed)
Please call in ativan with 1 refills

## 2014-10-24 NOTE — Telephone Encounter (Signed)
Last seen 09/02/14  MMM If approved route to nurse to call into [facility]

## 2014-10-25 ENCOUNTER — Telehealth: Payer: Self-pay | Admitting: *Deleted

## 2014-10-25 NOTE — Telephone Encounter (Signed)
Ativan refills called in.

## 2014-10-31 ENCOUNTER — Other Ambulatory Visit: Payer: Self-pay | Admitting: Nurse Practitioner

## 2014-10-31 ENCOUNTER — Other Ambulatory Visit: Payer: Self-pay | Admitting: *Deleted

## 2014-10-31 MED ORDER — GLUCOSE BLOOD VI STRP: ORAL_STRIP | Status: DC

## 2014-12-05 ENCOUNTER — Ambulatory Visit (INDEPENDENT_AMBULATORY_CARE_PROVIDER_SITE_OTHER): Payer: Medicare Other | Admitting: Nurse Practitioner

## 2014-12-05 ENCOUNTER — Encounter: Payer: Self-pay | Admitting: Nurse Practitioner

## 2014-12-05 VITALS — BP 133/69 | HR 59 | Temp 97.8°F | Ht 61.0 in | Wt 129.6 lb

## 2014-12-05 DIAGNOSIS — I10 Essential (primary) hypertension: Secondary | ICD-10-CM | POA: Diagnosis not present

## 2014-12-05 DIAGNOSIS — E1142 Type 2 diabetes mellitus with diabetic polyneuropathy: Secondary | ICD-10-CM

## 2014-12-05 DIAGNOSIS — E119 Type 2 diabetes mellitus without complications: Secondary | ICD-10-CM | POA: Diagnosis not present

## 2014-12-05 DIAGNOSIS — Z794 Long term (current) use of insulin: Secondary | ICD-10-CM

## 2014-12-05 DIAGNOSIS — E785 Hyperlipidemia, unspecified: Secondary | ICD-10-CM

## 2014-12-05 LAB — POCT GLYCOSYLATED HEMOGLOBIN (HGB A1C): Hemoglobin A1C: 7.8

## 2014-12-05 MED ORDER — ATORVASTATIN CALCIUM 10 MG PO TABS: 10.0000 mg | ORAL_TABLET | Freq: Every evening | ORAL | Status: DC

## 2014-12-05 MED ORDER — INSULIN GLARGINE 100 UNIT/ML ~~LOC~~ SOLN: 41.0000 [IU] | Freq: Every morning | SUBCUTANEOUS | Status: DC

## 2014-12-05 MED ORDER — AMLODIPINE BESYLATE 10 MG PO TABS: 10.0000 mg | ORAL_TABLET | Freq: Every day | ORAL | Status: DC

## 2014-12-05 MED ORDER — VALSARTAN-HYDROCHLOROTHIAZIDE 320-25 MG PO TABS: 1.0000 | ORAL_TABLET | Freq: Every day | ORAL | Status: DC

## 2014-12-05 MED ORDER — GLIPIZIDE ER 10 MG PO TB24: 10.0000 mg | ORAL_TABLET | Freq: Every day | ORAL | Status: DC

## 2014-12-05 MED ORDER — METOPROLOL SUCCINATE ER 100 MG PO TB24: ORAL_TABLET | ORAL | Status: DC

## 2014-12-05 NOTE — Progress Notes (Addendum)
Subjective:    Patient ID: Kara Dean, female    DOB: Mar 26, 1933, 81 y.o.   MRN: [id number]  Patient presents today for followup of chronic problems. Patient states she feels well. Pt reports checking her blood glucose twice a day and they ranging in the low 100's in the morning and 200's in the afternoon. No acute complaints.   Hypertension This is a chronic problem. The current episode started more than 1 year ago. Associated symptoms include palpitations (Intermittent - goes away with rest). Pertinent negatives include no chest pain, headaches or shortness of breath. Risk factors for coronary artery disease include diabetes mellitus, dyslipidemia and post-menopausal state. Past treatments include ACE inhibitors.  Hyperlipidemia This is a chronic problem. The current episode started more than 1 year ago. Pertinent negatives include no chest pain, myalgias or shortness of breath. Risk factors for coronary artery disease include hypertension, diabetes mellitus and post-menopausal.  Diabetes She presents for her follow-up diabetic visit. She has type 2 diabetes mellitus. Pertinent negatives for hypoglycemia include no headaches. Pertinent negatives for diabetes include no chest pain, no polydipsia, no polyphagia, no polyuria and no weakness. Risk factors for coronary artery disease include post-menopausal and diabetes mellitus. Her breakfast blood glucose is taken between 8-9 am. Her breakfast blood glucose range is generally 90-110 mg/dl. Her dinner blood glucose is taken between 5-6 pm. Her overall blood glucose range is >200 mg/dl.  GAD Takes lorzepam as needed. Has not had a recent in a few weeks.  GERD No symptoms as long as taking Nexium.  Hx CVA Takes aggrenox- No bleeding   Review of Systems  Constitutional: Negative.   HENT: Negative.   Respiratory: Negative for shortness of breath.   Cardiovascular: Positive for palpitations (Intermittent - goes away with rest). Negative for  chest pain.  Endocrine: Negative for polydipsia, polyphagia and polyuria.  Musculoskeletal: Negative for myalgias.  Neurological: Negative for weakness and headaches.  Psychiatric/Behavioral: Negative.   All other systems reviewed and are negative.      Objective:   Physical Exam  Constitutional: She is oriented to person, place, and time. She appears well-developed and well-nourished.  HENT:  Right Ear: External ear normal.  Left Ear: External ear normal.  Nose: Nose normal.  Mouth/Throat: Oropharynx is clear and moist.  Eyes: EOM are normal.  Neck: Trachea normal, normal range of motion and full passive range of motion without pain. Neck supple. No JVD present. Carotid bruit is not present. No thyromegaly present.  Cardiovascular: Normal rate, regular rhythm, normal heart sounds and intact distal pulses.  Exam reveals no gallop and no friction rub.   No murmur heard. Pulmonary/Chest: Effort normal and breath sounds normal.  Abdominal: Soft. Bowel sounds are normal. She exhibits no distension and no mass. There is no tenderness.  Musculoskeletal: Normal range of motion.  Lymphadenopathy:    She has no cervical adenopathy.  Neurological: She is alert and oriented to person, place, and time. She has normal reflexes.  Skin: Skin is warm and dry.  Psychiatric: She has a normal mood and affect. Her behavior is normal. Judgment and thought content normal.   BP 133/69 mmHg  Pulse 59  Temp(Src) 97.8 F (36.6 C) (Oral)  Ht _0  (1.549 m)  Wt 129 lb 9.6 oz (58.786 kg)  BMI 24.50 kg/m2  Results for orders placed or performed in visit on 12/05/14  POCT glycosylated hemoglobin (Hb A1C)  Result Value Ref Range   Hemoglobin A1C 7.8  Assessment & Plan:   1. Type 2 diabetes mellitus treated with insulin Watch carbs - POCT glycosylated hemoglobin (Hb A1C)  2. Essential hypertension Do not add salt to diet - CMP14+EGFR - metoprolol succinate (TOPROL-XL) 100 MG 24 hr tablet;  TAKE 1 TABLET ONCE A DAY WITH A MEAL  Dispense: 90 tablet; Refill: 1 - amLODipine (NORVASC) 10 MG tablet; Take 1 tablet (10 mg total) by mouth daily. TAKE 1 TABLET DAILY  Dispense: 90 tablet; Refill: 1 - valsartan-hydrochlorothiazide (DIOVAN-HCT) 320-25 MG per tablet; Take 1 tablet by mouth daily.  Dispense: 90 tablet; Refill: 1  3. Hyperlipidemia Low fat diet - NMR, lipoprofile - atorvastatin (LIPITOR) 10 MG tablet; Take 1 tablet (10 mg total) by mouth every evening. TAKE 1 TABLET ONCE DAILY AT 6PM  Dispense: 90 tablet; Refill: 1  4. Type 2 diabetes mellitus with diabetic polyneuropathy - glipiZIDE (GLIPIZIDE XL) 10 MG 24 hr tablet; Take 1 tablet (10 mg total) by mouth daily with breakfast.  Dispense: 90 tablet; Refill: 1 - insulin glargine (LANTUS) 100 UNIT/ML injection; Inject 0.41 mLs (41 Units total) into the skin every morning.  Dispense: 10 mL; Refill: 3    Labs pending Health maintenance reviewed Diet and exercise encouraged Continue all meds Follow up  In 3 months    [place], FNP

## 2014-12-05 NOTE — Patient Instructions (Signed)

## 2014-12-06 LAB — CMP14+EGFR
ALBUMIN: 4.3 g/dL (ref 3.5–4.8)
ALK PHOS: 68 IU/L (ref 39–117)
ALT: 13 IU/L (ref 0–32)
AST: 14 IU/L (ref 0–40)
Albumin/Globulin Ratio: 1.4 (ref 1.1–2.5)
BILIRUBIN TOTAL: 0.6 mg/dL (ref 0.0–1.2)
BUN/Creatinine Ratio: 8 — ABNORMAL LOW (ref 11–26)
BUN: 8 mg/dL (ref 8–27)
CHLORIDE: 94 mmol/L — AB (ref 97–108)
CO2: 25 mmol/L (ref 18–29)
Calcium: 9.9 mg/dL (ref 8.7–10.3)
Creatinine, Ser: 0.99 mg/dL (ref 0.57–1.00)
GFR calc non Af Amer: 54 mL/min/{1.73_m2} — ABNORMAL LOW (ref >59)
GFR, EST AFRICAN AMERICAN: 63 mL/min/{1.73_m2} (ref >59)
Globulin, Total: 3 g/dL (ref 1.5–4.5)
Glucose: 112 mg/dL — ABNORMAL HIGH (ref 65–99)
Potassium: 4.3 mmol/L (ref 3.5–5.2)
SODIUM: 134 mmol/L (ref 134–144)
TOTAL PROTEIN: 7.3 g/dL (ref 6.0–8.5)

## 2014-12-06 LAB — NMR, LIPOPROFILE
CHOLESTEROL: 104 mg/dL (ref 100–199)
HDL Cholesterol by NMR: 42 mg/dL (ref >39)
HDL PARTICLE NUMBER: 25.4 umol/L — AB (ref >=30.5)
LDL Particle Number: 561 nmol/L (ref <1000)
LDL SIZE: 20.6 nm (ref >20.5)
LDL-C: 36 mg/dL (ref 0–99)
LP-IR Score: 59 — ABNORMAL HIGH (ref <=45)
Small LDL Particle Number: 332 nmol/L (ref <=527)
Triglycerides by NMR: 129 mg/dL (ref 0–149)

## 2014-12-08 ENCOUNTER — Telehealth: Payer: Self-pay | Admitting: Nurse Practitioner

## 2014-12-08 NOTE — Telephone Encounter (Signed)
-----   Message from [facility], [place] sent at 12/06/2014  9:20 PM EST ----- Hgba1c discussed at appointment Kidney and liver function stable Cholesterol looks great Continue current meds- low fat diet and exercise and recheck in 3 months

## 2014-12-13 ENCOUNTER — Encounter: Payer: Self-pay | Admitting: Nurse Practitioner

## 2014-12-30 ENCOUNTER — Other Ambulatory Visit: Payer: Self-pay | Admitting: Nurse Practitioner

## 2015-01-02 NOTE — Telephone Encounter (Signed)
rx called into pharmacy

## 2015-01-02 NOTE — Telephone Encounter (Signed)
Please call in ativan with 1 refills

## 2015-01-02 NOTE — Telephone Encounter (Signed)
laqst seen 12/05/14 MMM If approved route to nurse to call into [facility]

## 2015-01-26 ENCOUNTER — Other Ambulatory Visit: Payer: Self-pay | Admitting: Family Medicine

## 2015-01-30 ENCOUNTER — Other Ambulatory Visit: Payer: Self-pay | Admitting: Nurse Practitioner

## 2015-01-30 NOTE — Telephone Encounter (Signed)
Last seen 12/05/14 MMM  Aggrenox not on med list

## 2015-02-13 ENCOUNTER — Other Ambulatory Visit: Payer: Self-pay | Admitting: Nurse Practitioner

## 2015-02-15 ENCOUNTER — Encounter: Payer: Self-pay | Admitting: Cardiovascular Disease

## 2015-02-15 ENCOUNTER — Ambulatory Visit (INDEPENDENT_AMBULATORY_CARE_PROVIDER_SITE_OTHER): Payer: Medicare Other | Admitting: Cardiovascular Disease

## 2015-02-15 VITALS — BP 156/64 | HR 54 | Ht 61.0 in | Wt 129.1 lb

## 2015-02-15 DIAGNOSIS — I10 Essential (primary) hypertension: Secondary | ICD-10-CM | POA: Diagnosis not present

## 2015-02-15 NOTE — Progress Notes (Signed)
Cardiology Office Note Date:  02/15/2015   ID:  Kara Dean, DOB 08/07/1933, MRN [id number]  PCP:  [name], FNP  Cardiologist:  Sherren Mocha, MD    Chief Complaint  Patient presents with  . Hypertension     History of Present Illness: Kara Dean is a 81 y.o. female who presents for follow-up evaluation. The patient has a history of TIA, hypertension, type 2 diabetes, and dyslipidemia. She was last seen 1 year ago. The patient had a nuclear stress test in 2014 to evaluate chest pain. This showed normal LVEF and normal perfusion.  She records home BP's. Reports average 140/60's. The patient feels well. She denies chest pain, chest pressure, shortness of breath, orthopnea, or PND. She does admit to leg swelling. She has not tried leg elevation. She's compliant with her medications. She sees Ronnald Collum every 3 months for management of her diabetes and hypertension.  Past Medical History  Diagnosis Date  . Hypertension   . History of TIAs   . Dyslipidemia   . CVA (cerebral infarction)   . Diabetes mellitus     type 2  . Depression   . Osteoarthritis   . Carotid bruit   . Hyponatremia   . Hyperlipidemia   . Anxiety     Past Surgical History  Procedure Laterality Date  . Back surgery  1994  . Vesicovaginal fistula closure w/ tah  1984  . Tubal ligation    . Abdominal hysterectomy      Current Outpatient Prescriptions  Medication Sig Dispense Refill  . amLODipine (NORVASC) 10 MG tablet Take 10 mg by mouth daily.    Marland Kitchen atorvastatin (LIPITOR) 10 MG tablet Take 10 mg by mouth daily at 6 PM.    . B Complex Vitamins (B COMPLEX-B12) TABS Take 1 tablet by mouth daily.      Marland Kitchen dipyridamole-aspirin (AGGRENOX) 200-25 MG per 12 hr capsule Take 1 capsule by mouth 2 (two) times daily.    Marland Kitchen glipiZIDE (GLIPIZIDE XL) 10 MG 24 hr tablet Take 1 tablet (10 mg total) by mouth daily with breakfast. 90 tablet 1  . insulin glargine (LANTUS) 100 UNIT/ML injection Inject 0.41  mLs (41 Units total) into the skin every morning. 10 mL 3  . LORazepam (ATIVAN) 0.5 MG tablet Take 0.5 mg by mouth 2 (two) times daily.    . Magnesium 250 MG TABS Take 1 tablet by mouth daily.      . metoprolol succinate (TOPROL-XL) 100 MG 24 hr tablet Take 100 mg by mouth daily. Take with or immediately following a meal.    . omega-3 fish oil (MAXEPA) 1000 MG CAPS capsule Take 1 capsule by mouth daily.      . ONE TOUCH ULTRA TEST test strip CHECK BLOOD SUGAR 3 TIMES A DAY OR AS DIRECTED 100 each 2  . pantoprazole (PROTONIX) 40 MG tablet Take 1 tablet (40 mg total) by mouth daily. 30 tablet 3  . valsartan-hydrochlorothiazide (DIOVAN-HCT) 320-25 MG per tablet Take 1 tablet by mouth daily. 90 tablet 1  . vitamin D, CHOLECALCIFEROL, 400 UNITS tablet Take 400 Units by mouth daily.       No current facility-administered medications for this visit.    Allergies:   Codeine; Latex; Morphine; Niacin; Sulfa antibiotics; and Sulfonamide derivatives   Social History:  The patient  reports that she has never smoked. She has never used smokeless tobacco. She reports that she does not drink alcohol or use illicit drugs.   Family  History:  The patient's  family history includes Alcohol abuse in her brother; Coronary artery disease in her brother and sister; Emphysema in her father; Hypertension in her mother; Lung disease in her brother; Seizures in her sister; Stroke in her mother.    ROS:  Please see the history of present illness.  Otherwise, review of systems is positive for hearing loss, visual disturbance.  All other systems are reviewed and negative.   PHYSICAL EXAM: VS:  BP 156/64 mmHg  Pulse 54  Ht 5' 1"  (1.549 m)  Wt 129 lb 1.9 oz (58.568 kg)  BMI 24.41 kg/m2 , BMI Body mass index is 24.41 kg/(m^2). GEN: Well nourished, well developed, Pleasant elderly woman in no acute distress HEENT: normal Neck: no JVD, no masses. No carotid bruits Cardiac: RRR without murmur or gallop                  Respiratory:  clear to auscultation bilaterally, normal work of breathing GI: soft, nontender, nondistended, + BS MS: no deformity or atrophy Ext: no pretibial edema, pedal pulses 2+= bilaterally Skin: warm and dry, no rash Neuro:  Strength and sensation are intact Psych: euthymic mood, full affect  EKG:  EKG is ordered today. The ekg ordered today shows Sinus bradycardia 54 bpm, nonspecific ST-T changes.  Recent Labs: 12/05/2014: ALT 13; BUN 8; Creatinine 0.99; Potassium 4.3; Sodium 134   Lipid Panel     Component Value Date/Time   CHOL 104 12/05/2014 1025   CHOL 124 02/26/2013 1043   TRIG 129 12/05/2014 1025   TRIG 182* 02/26/2013 1043   TRIG 127 11/10/2010 0327   HDL 42 12/05/2014 1025   HDL 40 02/26/2013 1043   HDL 37* 11/10/2010 0327   CHOLHDL 3.0 11/10/2010 0327   VLDL 25 11/10/2010 0327   LDLCALC 42 04/27/2014 1455   LDLCALC 48 02/26/2013 1043   LDLCALC  11/10/2010 0327    48        Total Cholesterol/HDL:CHD Risk Coronary Heart Disease Risk Table                     Men   Women  1/2 Average Risk   3.4   3.3  Average Risk       5.0   4.4  2 X Average Risk   9.6   7.1  3 X Average Risk  23.4   11.0        Use the calculated Patient Ratio above and the CHD Risk Table to determine the patient's CHD Risk.        ATP III CLASSIFICATION (LDL):  <100     mg/dL   Optimal  100-129  mg/dL   Near or Above                    Optimal  130-159  mg/dL   Borderline  160-189  mg/dL   High  >190     mg/dL   Very High      Wt Readings from Last 3 Encounters:  02/15/15 129 lb 1.9 oz (58.568 kg)  12/05/14 129 lb 9.6 oz (58.786 kg)  09/02/14 127 lb (57.607 kg)     Cardiac Studies Reviewed: Myoview scan 02/15/2013: Overall Impression: Normal stress nuclear study.  LV Ejection Fraction: 68%. LV Wall Motion: NL LV Function; NL Wall Motion.  ASSESSMENT AND PLAN: 1.  Essential hypertension: While her blood pressure is elevated today, her home readings sound in good  range. She is  on multidrug therapy with amlodipine, metoprolol succinate, valsartan, and hydrochlorothiazide. Will continue the same. Suspect her leg swelling is related to amlodipine. Advised about leg elevation.  2. Hyperlipidemia: The patient takes atorvastatin. Lipids are followed by Ronnald Collum.  3. Carotid stenosis: Moderate by serial duplex studies. Medical therapy is indicated.  Current medicines are reviewed with the patient today.  The patient does not have concerns regarding medicines.  The following changes have been made:  no change  Labs/ tests ordered today include:  No orders of the defined types were placed in this encounter.   Disposition:   FU one year  Signed, Sherren Mocha, MD  02/15/2015 2:49 PM    [address], [place], [place]  [address] Phone: [phone]; Fax: [phone]

## 2015-02-15 NOTE — Patient Instructions (Addendum)
Your physician recommends that you continue on your current medications as directed. Please refer to the Current Medication list given to you today.  Your physician wants you to follow-up in: 1 year with Dr Burt Knack. You will receive a reminder letter in the mail two months in advance. If you don't receive a letter, please call our office to schedule the follow-up appointment.

## 2015-03-02 ENCOUNTER — Other Ambulatory Visit: Payer: Self-pay | Admitting: Nurse Practitioner

## 2015-03-04 NOTE — Telephone Encounter (Signed)
Last seen 12/05/14, last filled 02/01/15. Call in to [facility]

## 2015-03-06 NOTE — Telephone Encounter (Signed)
rx called into pharmacy

## 2015-03-06 NOTE — Telephone Encounter (Signed)
Please call in ativan with 1 refills

## 2015-03-09 ENCOUNTER — Encounter: Payer: Self-pay | Admitting: Nurse Practitioner

## 2015-03-09 ENCOUNTER — Ambulatory Visit (INDEPENDENT_AMBULATORY_CARE_PROVIDER_SITE_OTHER): Payer: Medicare Other | Admitting: Nurse Practitioner

## 2015-03-09 DIAGNOSIS — E1142 Type 2 diabetes mellitus with diabetic polyneuropathy: Secondary | ICD-10-CM | POA: Diagnosis not present

## 2015-03-09 DIAGNOSIS — N1831 Chronic kidney disease, stage 3a: Secondary | ICD-10-CM

## 2015-03-09 DIAGNOSIS — I10 Essential (primary) hypertension: Secondary | ICD-10-CM | POA: Diagnosis not present

## 2015-03-09 DIAGNOSIS — F329 Major depressive disorder, single episode, unspecified: Secondary | ICD-10-CM

## 2015-03-09 DIAGNOSIS — K219 Gastro-esophageal reflux disease without esophagitis: Secondary | ICD-10-CM

## 2015-03-09 DIAGNOSIS — E785 Hyperlipidemia, unspecified: Secondary | ICD-10-CM

## 2015-03-09 DIAGNOSIS — E119 Type 2 diabetes mellitus without complications: Secondary | ICD-10-CM | POA: Diagnosis not present

## 2015-03-09 DIAGNOSIS — N183 Chronic kidney disease, stage 3 (moderate): Secondary | ICD-10-CM | POA: Diagnosis not present

## 2015-03-09 DIAGNOSIS — F32A Depression, unspecified: Secondary | ICD-10-CM

## 2015-03-09 DIAGNOSIS — F411 Generalized anxiety disorder: Secondary | ICD-10-CM | POA: Diagnosis not present

## 2015-03-09 DIAGNOSIS — Z794 Long term (current) use of insulin: Secondary | ICD-10-CM

## 2015-03-09 DIAGNOSIS — I2510 Atherosclerotic heart disease of native coronary artery without angina pectoris: Secondary | ICD-10-CM

## 2015-03-09 LAB — POCT GLYCOSYLATED HEMOGLOBIN (HGB A1C): Hemoglobin A1C: 7.4

## 2015-03-09 MED ORDER — ATORVASTATIN CALCIUM 10 MG PO TABS: 10.0000 mg | ORAL_TABLET | Freq: Every day | ORAL | Status: DC

## 2015-03-09 MED ORDER — ASPIRIN-DIPYRIDAMOLE ER 25-200 MG PO CP12: 1.0000 | ORAL_CAPSULE | Freq: Two times a day (BID) | ORAL | Status: DC

## 2015-03-09 MED ORDER — INSULIN GLARGINE 100 UNIT/ML ~~LOC~~ SOLN: 41.0000 [IU] | Freq: Every morning | SUBCUTANEOUS | Status: DC

## 2015-03-09 MED ORDER — METOPROLOL SUCCINATE ER 100 MG PO TB24: 100.0000 mg | ORAL_TABLET | Freq: Every day | ORAL | Status: DC

## 2015-03-09 MED ORDER — AMLODIPINE BESYLATE 10 MG PO TABS: 10.0000 mg | ORAL_TABLET | Freq: Every day | ORAL | Status: DC

## 2015-03-09 NOTE — Patient Instructions (Signed)

## 2015-03-09 NOTE — Progress Notes (Signed)
Subjective:    Patient ID: Kara Dean, female    DOB: 1933-02-08, 81 y.o.   MRN: [id number]  Patient presents today for followup of chronic problems. Patient states she feels well. Pt reports checking her blood glucose twice a day and they ranging in the low 100's in the morning and 200's in the afternoon. No acute complaints.  HAS NOT TAKEN MEDS TODAY  Hypertension This is a chronic problem. The current episode started more than 1 year ago. Associated symptoms include palpitations (Intermittent - goes away with rest). Pertinent negatives include no chest pain, headaches or shortness of breath. Risk factors for coronary artery disease include diabetes mellitus, dyslipidemia and post-menopausal state. Past treatments include ACE inhibitors.  Hyperlipidemia This is a chronic problem. The current episode started more than 1 year ago. Pertinent negatives include no chest pain, myalgias or shortness of breath. Risk factors for coronary artery disease include hypertension, diabetes mellitus and post-menopausal.  Diabetes She presents for her follow-up diabetic visit. She has type 2 diabetes mellitus. Pertinent negatives for hypoglycemia include no headaches. Pertinent negatives for diabetes include no chest pain, no polydipsia, no polyphagia, no polyuria and no weakness. Risk factors for coronary artery disease include post-menopausal and diabetes mellitus. Her breakfast blood glucose is taken between 8-9 am. Her breakfast blood glucose range is generally 90-110 mg/dl. Her dinner blood glucose is taken between 5-6 pm. Her overall blood glucose range is >200 mg/dl.  GAD Takes lorzepam as needed. Has not had a recent in a few weeks.  GERD No symptoms as long as taking Nexium.  Hx CVA Takes aggrenox- No bleeding   Review of Systems  Constitutional: Negative.   HENT: Negative.   Respiratory: Negative for shortness of breath.   Cardiovascular: Positive for palpitations (Intermittent - goes away  with rest). Negative for chest pain.  Endocrine: Negative for polydipsia, polyphagia and polyuria.  Musculoskeletal: Negative for myalgias.  Neurological: Negative for weakness and headaches.  Psychiatric/Behavioral: Negative.   All other systems reviewed and are negative.      Objective:   Physical Exam  Constitutional: She is oriented to person, place, and time. She appears well-developed and well-nourished.  HENT:  Right Ear: External ear normal.  Left Ear: External ear normal.  Nose: Nose normal.  Mouth/Throat: Oropharynx is clear and moist.  Eyes: EOM are normal.  Neck: Trachea normal, normal range of motion and full passive range of motion without pain. Neck supple. No JVD present. Carotid bruit is not present. No thyromegaly present.  Cardiovascular: Normal rate, regular rhythm, normal heart sounds and intact distal pulses.  Exam reveals no gallop and no friction rub.   No murmur heard. Pulmonary/Chest: Effort normal and breath sounds normal.  Abdominal: Soft. Bowel sounds are normal. She exhibits no distension and no mass. There is no tenderness.  Musculoskeletal: Normal range of motion.  Lymphadenopathy:    She has no cervical adenopathy.  Neurological: She is alert and oriented to person, place, and time. She has normal reflexes.  Skin: Skin is warm and dry.  Psychiatric: She has a normal mood and affect. Her behavior is normal. Judgment and thought content normal.   BP 144/88 mmHg  Pulse 57  Temp(Src) 97 F (36.1 C) (Oral)  Ht 5' 1"  (1.549 m)  Wt 129 lb (58.514 kg)  BMI 24.39 kg/m2   Results for orders placed or performed in visit on 03/09/15  POCT glycosylated hemoglobin (Hb A1C)  Result Value Ref Range   Hemoglobin A1C 7.4  Assessment & Plan:   1. Type 2 diabetes mellitus treated with insulin   2. Essential hypertension   3. Hyperlipidemia   4. Gastroesophageal reflux disease without esophagitis   5. CKD stage G3a/A3, GFR 45 - 59 and albumin  creatinine ratio >300 mg/g   6. GAD (generalized anxiety disorder)   7. Depression    Orders Placed This Encounter  Procedures  . CMP14+EGFR  . NMR, lipoprofile  . POCT glycosylated hemoglobin (Hb A1C)   Meds ordered this encounter  Medications  . insulin glargine (LANTUS) 100 UNIT/ML injection    Sig: Inject 0.41 mLs (41 Units total) into the skin every morning.    Dispense:  30 mL    Refill:  3    Order Specific Question:  Supervising Provider    Answer:  Chipper Herb [[id number]]  . metoprolol succinate (TOPROL-XL) 100 MG 24 hr tablet    Sig: Take 1 tablet (100 mg total) by mouth daily. Take with or immediately following a meal.    Dispense:  90 tablet    Refill:  1    Order Specific Question:  Supervising Provider    Answer:  Chipper Herb [[id number]]  . dipyridamole-aspirin (AGGRENOX) 200-25 MG per 12 hr capsule    Sig: Take 1 capsule by mouth 2 (two) times daily.    Dispense:  2701 capsule    Refill:  1    Order Specific Question:  Supervising Provider    Answer:  Chipper Herb [[id number]]  . atorvastatin (LIPITOR) 10 MG tablet    Sig: Take 1 tablet (10 mg total) by mouth daily at 6 PM.    Dispense:  90 tablet    Refill:  1    Order Specific Question:  Supervising Provider    Answer:  Chipper Herb [[id number]]  . amLODipine (NORVASC) 10 MG tablet    Sig: Take 1 tablet (10 mg total) by mouth daily.    Dispense:  90 tablet    Refill:  1    Order Specific Question:  Supervising Provider    Answer:  Chipper Herb [place] maintenance reviewed Labs pending Diet and exercise encouraged RTO prn  [name], FNP

## 2015-03-10 LAB — NMR, LIPOPROFILE
Cholesterol: 108 mg/dL (ref 100–199)
HDL CHOLESTEROL BY NMR: 39 mg/dL — AB (ref >39)
HDL PARTICLE NUMBER: 28 umol/L — AB (ref >=30.5)
LDL Particle Number: 493 nmol/L (ref <1000)
LDL Size: 20.7 nm (ref >20.5)
LDL-C: 43 mg/dL (ref 0–99)
LP-IR Score: 61 — ABNORMAL HIGH (ref <=45)
SMALL LDL PARTICLE NUMBER: 280 nmol/L (ref <=527)
Triglycerides by NMR: 132 mg/dL (ref 0–149)

## 2015-03-10 LAB — CMP14+EGFR
ALT: 10 IU/L (ref 0–32)
AST: 17 IU/L (ref 0–40)
Albumin/Globulin Ratio: 1.5 (ref 1.1–2.5)
Albumin: 4.3 g/dL (ref 3.5–4.8)
Alkaline Phosphatase: 62 IU/L (ref 39–117)
BUN/Creatinine Ratio: 18 (ref 11–26)
BUN: 19 mg/dL (ref 8–27)
Bilirubin Total: 0.5 mg/dL (ref 0.0–1.2)
CALCIUM: 10.2 mg/dL (ref 8.7–10.3)
CO2: 24 mmol/L (ref 18–29)
Chloride: 93 mmol/L — ABNORMAL LOW (ref 97–108)
Creatinine, Ser: 1.06 mg/dL — ABNORMAL HIGH (ref 0.57–1.00)
GFR calc Af Amer: 58 mL/min/{1.73_m2} — ABNORMAL LOW (ref >59)
GFR calc non Af Amer: 50 mL/min/{1.73_m2} — ABNORMAL LOW (ref >59)
Globulin, Total: 2.8 g/dL (ref 1.5–4.5)
Glucose: 97 mg/dL (ref 65–99)
POTASSIUM: 3.9 mmol/L (ref 3.5–5.2)
Sodium: 136 mmol/L (ref 134–144)
Total Protein: 7.1 g/dL (ref 6.0–8.5)

## 2015-04-27 NOTE — Addendum Note (Signed)
Addended by: Leanord Asal T on: 04/27/2015 04:11 PM   Modules accepted: Orders

## 2015-05-01 ENCOUNTER — Other Ambulatory Visit: Payer: Self-pay | Admitting: Nurse Practitioner

## 2015-05-01 NOTE — Telephone Encounter (Signed)
Please call in ativan with 1 refills

## 2015-05-01 NOTE — Telephone Encounter (Signed)
Last seen 03/09/15 MMM  If approved route to nurse to call into [facility]

## 2015-05-02 NOTE — Telephone Encounter (Signed)
Refill called to [facility]

## 2015-05-31 ENCOUNTER — Other Ambulatory Visit: Payer: Self-pay | Admitting: Nurse Practitioner

## 2015-06-23 ENCOUNTER — Ambulatory Visit (INDEPENDENT_AMBULATORY_CARE_PROVIDER_SITE_OTHER): Payer: Medicare Other | Admitting: Nurse Practitioner

## 2015-06-23 ENCOUNTER — Encounter: Payer: Self-pay | Admitting: Nurse Practitioner

## 2015-06-23 VITALS — BP 136/82 | HR 61 | Temp 97.9°F | Ht 61.0 in | Wt 129.0 lb

## 2015-06-23 DIAGNOSIS — K219 Gastro-esophageal reflux disease without esophagitis: Secondary | ICD-10-CM

## 2015-06-23 DIAGNOSIS — E1142 Type 2 diabetes mellitus with diabetic polyneuropathy: Secondary | ICD-10-CM | POA: Diagnosis not present

## 2015-06-23 DIAGNOSIS — N183 Chronic kidney disease, stage 3 (moderate): Secondary | ICD-10-CM

## 2015-06-23 DIAGNOSIS — I10 Essential (primary) hypertension: Secondary | ICD-10-CM | POA: Diagnosis not present

## 2015-06-23 DIAGNOSIS — E785 Hyperlipidemia, unspecified: Secondary | ICD-10-CM | POA: Diagnosis not present

## 2015-06-23 DIAGNOSIS — I639 Cerebral infarction, unspecified: Secondary | ICD-10-CM

## 2015-06-23 DIAGNOSIS — N1831 Chronic kidney disease, stage 3a: Secondary | ICD-10-CM

## 2015-06-23 DIAGNOSIS — I6350 Cerebral infarction due to unspecified occlusion or stenosis of unspecified cerebral artery: Secondary | ICD-10-CM

## 2015-06-23 DIAGNOSIS — Z794 Long term (current) use of insulin: Secondary | ICD-10-CM

## 2015-06-23 DIAGNOSIS — F32A Depression, unspecified: Secondary | ICD-10-CM

## 2015-06-23 DIAGNOSIS — E119 Type 2 diabetes mellitus without complications: Secondary | ICD-10-CM

## 2015-06-23 DIAGNOSIS — R5383 Other fatigue: Secondary | ICD-10-CM

## 2015-06-23 DIAGNOSIS — F411 Generalized anxiety disorder: Secondary | ICD-10-CM | POA: Diagnosis not present

## 2015-06-23 DIAGNOSIS — F329 Major depressive disorder, single episode, unspecified: Secondary | ICD-10-CM | POA: Diagnosis not present

## 2015-06-23 DIAGNOSIS — I2510 Atherosclerotic heart disease of native coronary artery without angina pectoris: Secondary | ICD-10-CM | POA: Diagnosis not present

## 2015-06-23 LAB — POCT GLYCOSYLATED HEMOGLOBIN (HGB A1C): HEMOGLOBIN A1C: 7.7

## 2015-06-23 MED ORDER — ASPIRIN-DIPYRIDAMOLE ER 25-200 MG PO CP12: 1.0000 | ORAL_CAPSULE | Freq: Two times a day (BID) | ORAL | Status: DC

## 2015-06-23 MED ORDER — AMLODIPINE BESYLATE 10 MG PO TABS: 10.0000 mg | ORAL_TABLET | Freq: Every day | ORAL | Status: DC

## 2015-06-23 MED ORDER — GLIPIZIDE ER 10 MG PO TB24: 10.0000 mg | ORAL_TABLET | Freq: Every day | ORAL | Status: DC

## 2015-06-23 MED ORDER — ATORVASTATIN CALCIUM 10 MG PO TABS: 10.0000 mg | ORAL_TABLET | Freq: Every day | ORAL | Status: DC

## 2015-06-23 MED ORDER — INSULIN PEN NEEDLE 31G X 6 MM MISC: 1.0000 | Freq: Every day | Status: DC

## 2015-06-23 MED ORDER — METOPROLOL SUCCINATE ER 100 MG PO TB24: 100.0000 mg | ORAL_TABLET | Freq: Every day | ORAL | Status: DC

## 2015-06-23 MED ORDER — LORAZEPAM 0.5 MG PO TABS: ORAL_TABLET | ORAL | Status: DC

## 2015-06-23 MED ORDER — PANTOPRAZOLE SODIUM 40 MG PO TBEC: 40.0000 mg | DELAYED_RELEASE_TABLET | Freq: Every day | ORAL | Status: DC

## 2015-06-23 MED ORDER — VALSARTAN-HYDROCHLOROTHIAZIDE 320-25 MG PO TABS: 1.0000 | ORAL_TABLET | Freq: Every day | ORAL | Status: DC

## 2015-06-23 MED ORDER — INSULIN GLARGINE 100 UNIT/ML ~~LOC~~ SOLN: 41.0000 [IU] | Freq: Every morning | SUBCUTANEOUS | Status: DC

## 2015-06-23 NOTE — Patient Instructions (Signed)

## 2015-06-23 NOTE — Progress Notes (Signed)
Subjective:    Patient ID: Kara Dean, female    DOB: 10-16-1933, 81 y.o.   MRN: [id number]  Patient presents today for followup of chronic problems. Patient c/o feeling tired- says that it started about 2 months ago-her appetite has decreased but she has not lost any weight.   Hypertension This is a chronic problem. The current episode started more than 1 year ago. Associated symptoms include palpitations (Intermittent - goes away with rest). Pertinent negatives include no chest pain, headaches or shortness of breath. Risk factors for coronary artery disease include diabetes mellitus, dyslipidemia and post-menopausal state. Past treatments include ACE inhibitors.  Hyperlipidemia This is a chronic problem. The current episode started more than 1 year ago. Pertinent negatives include no chest pain, myalgias or shortness of breath. Risk factors for coronary artery disease include hypertension, diabetes mellitus and post-menopausal.  Diabetes She presents for her follow-up diabetic visit. She has type 2 diabetes mellitus. Pertinent negatives for hypoglycemia include no headaches. Pertinent negatives for diabetes include no chest pain, no polydipsia, no polyphagia, no polyuria and no weakness. Risk factors for coronary artery disease include post-menopausal and diabetes mellitus. Her breakfast blood glucose is taken between 8-9 am. Her breakfast blood glucose range is generally 90-110 mg/dl. Her dinner blood glucose is taken between 5-6 pm. Her overall blood glucose range is >200 mg/dl.  GAD Takes lorzepam as needed. Has not had a recent in a few weeks.  GERD No symptoms as long as taking Nexium.  Hx CVA Takes aggrenox- No bleeding   Review of Systems  Constitutional: Negative.   HENT: Negative.   Respiratory: Negative for shortness of breath.   Cardiovascular: Positive for palpitations (Intermittent - goes away with rest). Negative for chest pain.  Endocrine: Negative for polydipsia,  polyphagia and polyuria.  Musculoskeletal: Negative for myalgias.  Neurological: Negative for weakness and headaches.  Psychiatric/Behavioral: Negative.   All other systems reviewed and are negative.      Objective:   Physical Exam  Constitutional: She is oriented to person, place, and time. She appears well-developed and well-nourished.  HENT:  Right Ear: External ear normal.  Left Ear: External ear normal.  Nose: Nose normal.  Mouth/Throat: Oropharynx is clear and moist.  Eyes: EOM are normal.  Neck: Trachea normal, normal range of motion and full passive range of motion without pain. Neck supple. No JVD present. Carotid bruit is not present. No thyromegaly present.  Cardiovascular: Normal rate, regular rhythm, normal heart sounds and intact distal pulses.  Exam reveals no gallop and no friction rub.   No murmur heard. Pulmonary/Chest: Effort normal and breath sounds normal.  Abdominal: Soft. Bowel sounds are normal. She exhibits no distension and no mass. There is no tenderness.  Musculoskeletal: Normal range of motion.  Lymphadenopathy:    She has no cervical adenopathy.  Neurological: She is alert and oriented to person, place, and time. She has normal reflexes.  Skin: Skin is warm and dry.  Psychiatric: She has a normal mood and affect. Her behavior is normal. Judgment and thought content normal.    BP 136/82 mmHg  Pulse 61  Temp(Src) 97.9 F (36.6 C) (Oral)  Ht 5' 1"  (1.549 m)  Wt 129 lb (58.514 kg)  BMI 24.39 kg/m2   Results for orders placed or performed in visit on 06/23/15  POCT glycosylated hemoglobin (Hb A1C)  Result Value Ref Range   Hemoglobin A1C 7.7        Assessment & Plan:   1. Essential hypertension  Do not add salt to diet - CMP14+EGFR - valsartan-hydrochlorothiazide (DIOVAN-HCT) 320-25 MG per tablet; Take 1 tablet by mouth daily.  Dispense: 90 tablet; Refill: 1 - amLODipine (NORVASC) 10 MG tablet; Take 1 tablet (10 mg total) by mouth daily.   Dispense: 90 tablet; Refill: 1  2. Hyperlipidemia *2low fat diet - Lipid panel - atorvastatin (LIPITOR) 10 MG tablet; Take 1 tablet (10 mg total) by mouth daily at 6 PM.  Dispense: 90 tablet; Refill: 1  3. Type 2 diabetes mellitus with diabetic polyneuropathy continue to count carbs - POCT glycosylated hemoglobin (Hb A1C) - insulin glargine (LANTUS) 100 UNIT/ML injection; Inject 0.41 mLs (41 Units total) into the skin every morning.  Dispense: 30 mL; Refill: 3 - glipiZIDE (GLIPIZIDE XL) 10 MG 24 hr tablet; Take 1 tablet (10 mg total) by mouth daily with breakfast.  Dispense: 90 tablet; Refill: 1 - Insulin Pen Needle (DROPLET PEN NEEDLES) 31G X 6 MM MISC; 1 each by Does not apply route daily.  Dispense: 100 each; Refill: 5  4. Cerebral artery occlusion with cerebral infarction  5. Gastroesophageal reflux disease without esophagitis Avoid spicy foods Do not eat 2 hours prior to bedtime - pantoprazole (PROTONIX) 40 MG tablet; Take 1 tablet (40 mg total) by mouth daily.  Dispense: 90 tablet; Refill: 1  6. Type 2 diabetes mellitus treated with insulin  7. Depression Stress management  8. GAD (generalized anxiety disorder) - LORazepam (ATIVAN) 0.5 MG tablet; TAKE  (1)  TABLET TWICE A DAY.  Dispense: 60 tablet; Refill: 1  9. CKD stage G3a/A3, GFR 45 - 59 and albumin creatinine ratio >300 mg/g  10. Coronary artery disease involving native coronary artery of native heart without angina pectoris - metoprolol succinate (TOPROL-XL) 100 MG 24 hr tablet; Take 1 tablet (100 mg total) by mouth daily. Take with or immediately following a meal.  Dispense: 90 tablet; Refill: 1 - dipyridamole-aspirin (AGGRENOX) 200-25 MG per 12 hr capsule; Take 1 capsule by mouth 2 (two) times daily.  Dispense: 2701 capsule; Refill: 1  11. Other fatigue Labs pending - Anemia Profile B - Thyroid Panel With TSH    Labs pending Health maintenance reviewed Diet and exercise encouraged Continue all meds Follow  up  In 3 month   [place], FNP

## 2015-06-24 LAB — CMP14+EGFR
ALT: 12 IU/L (ref 0–32)
AST: 15 IU/L (ref 0–40)
Albumin/Globulin Ratio: 1.6 (ref 1.1–2.5)
Albumin: 4.1 g/dL (ref 3.5–4.8)
Alkaline Phosphatase: 61 IU/L (ref 39–117)
BUN/Creatinine Ratio: 15 (ref 11–26)
BUN: 14 mg/dL (ref 8–27)
Bilirubin Total: 0.3 mg/dL (ref 0.0–1.2)
CO2: 25 mmol/L (ref 18–29)
Calcium: 9.7 mg/dL (ref 8.7–10.3)
Chloride: 89 mmol/L — ABNORMAL LOW (ref 97–108)
Creatinine, Ser: 0.95 mg/dL (ref 0.57–1.00)
GFR calc Af Amer: 66 mL/min/{1.73_m2} (ref >59)
GFR calc non Af Amer: 57 mL/min/{1.73_m2} — ABNORMAL LOW (ref >59)
Globulin, Total: 2.6 g/dL (ref 1.5–4.5)
Glucose: 183 mg/dL — ABNORMAL HIGH (ref 65–99)
Potassium: 4.8 mmol/L (ref 3.5–5.2)
Sodium: 130 mmol/L — ABNORMAL LOW (ref 134–144)
Total Protein: 6.7 g/dL (ref 6.0–8.5)

## 2015-06-24 LAB — LIPID PANEL
Chol/HDL Ratio: 2.6 ratio units (ref 0.0–4.4)
Cholesterol, Total: 85 mg/dL — ABNORMAL LOW (ref 100–199)
HDL: 33 mg/dL — AB (ref >39)
LDL Calculated: 24 mg/dL (ref 0–99)
Triglycerides: 142 mg/dL (ref 0–149)
VLDL CHOLESTEROL CAL: 28 mg/dL (ref 5–40)

## 2015-06-26 ENCOUNTER — Other Ambulatory Visit: Payer: Self-pay | Admitting: Nurse Practitioner

## 2015-07-26 ENCOUNTER — Encounter: Payer: Self-pay | Admitting: Pharmacist

## 2015-07-26 ENCOUNTER — Ambulatory Visit (INDEPENDENT_AMBULATORY_CARE_PROVIDER_SITE_OTHER): Payer: Medicare Other | Admitting: Pharmacist

## 2015-07-26 VITALS — BP 124/56 | HR 60 | Ht 59.5 in | Wt 130.0 lb

## 2015-07-26 DIAGNOSIS — I739 Peripheral vascular disease, unspecified: Secondary | ICD-10-CM

## 2015-07-26 DIAGNOSIS — E538 Deficiency of other specified B group vitamins: Secondary | ICD-10-CM

## 2015-07-26 DIAGNOSIS — I779 Disorder of arteries and arterioles, unspecified: Secondary | ICD-10-CM

## 2015-07-26 DIAGNOSIS — Z0000 Encounter for general adult medical examination without abnormal findings: Secondary | ICD-10-CM | POA: Diagnosis not present

## 2015-07-26 DIAGNOSIS — M25572 Pain in left ankle and joints of left foot: Secondary | ICD-10-CM

## 2015-07-26 DIAGNOSIS — R5382 Chronic fatigue, unspecified: Secondary | ICD-10-CM | POA: Diagnosis not present

## 2015-07-26 NOTE — Patient Instructions (Signed)
Kara Dean , Thank you for taking time to come for your Medicare Wellness Visit. I appreciate your ongoing commitment to your health goals. Please review the following plan we discussed and let me know if I can assist you in the future.   These are the goals we discussed: Make appointment with dentist to have false teeth adjusted.  I have sent referral to Dr Irving Shows to have foot pain evaluated.  Change Lantus from evening to morning.  Please call me if you has any blood glucose readings less than 70.  We can adjust if needed.     This is a list of the screening recommended for you and due dates:  Health Maintenance  Topic Date Due  . Flu Shot  06/04/2016  . Eye exam for diabetics  08/06/2015  . Urine Protein Check  09/27/2015  . Hemoglobin A1C  12/24/2015  . Complete foot exam   06/22/2016  . Colon Cancer Screening  03/04/2021  . Tetanus Vaccine  08/15/2024  . DEXA scan (bone density measurement)  Completed  . Shingles Vaccine  Completed  . Pneumonia vaccines  Completed    Health Maintenance Adopting a healthy lifestyle and getting preventive care can go a long way to promote health and wellness. Talk with your health care provider about what schedule of regular examinations is right for you. This is a good chance for you to check in with your provider about disease prevention and staying healthy. In between checkups, there are plenty of things you can do on your own. Experts have done a lot of research about which lifestyle changes and preventive measures are most likely to keep you healthy. Ask your health care provider for more information. WEIGHT AND DIET  Eat a healthy diet  Be sure to include plenty of vegetables, fruits, low-fat dairy products, and lean protein.  Do not eat a lot of foods high in solid fats, added sugars, or salt.  Get regular exercise. This is one of the most important things you can do for your health.  Most adults should exercise for at least 150  minutes each week. The exercise should increase your heart rate and make you sweat (moderate-intensity exercise).  Most adults should also do strengthening exercises at least twice a week. This is in addition to the moderate-intensity exercise.  Maintain a healthy weight  Body mass index (BMI) is a measurement that can be used to identify possible weight problems. It estimates body fat based on height and weight. Your health care provider can help determine your BMI and help you achieve or maintain a healthy weight.  For females 22 years of age and older:   A BMI below 18.5 is considered underweight.  A BMI of 18.5 to 24.9 is normal.  A BMI of 25 to 29.9 is considered overweight.  A BMI of 30 and above is considered obese.  Watch levels of cholesterol and blood lipids  You should start having your blood tested for lipids and cholesterol at 79 years of age, then have this test every 5 years.  You may need to have your cholesterol levels checked more often if:  Your lipid or cholesterol levels are high.  You are older than 79 years of age.  You are at high risk for heart disease.  CANCER SCREENING   Lung Cancer  Lung cancer screening is recommended for adults 17-17 years old who are at high risk for lung cancer because of a history of smoking.  A yearly  low-dose CT scan of the lungs is recommended for people who:  Currently smoke.  Have quit within the past 15 years.  Have at least a 30-pack-year history of smoking. A pack year is smoking an average of one pack of cigarettes a day for 1 year.  Yearly screening should continue until it has been 15 years since you quit.  Yearly screening should stop if you develop a health problem that would prevent you from having lung cancer treatment.  Breast Cancer  Practice breast self-awareness. This means understanding how your breasts normally appear and feel.  It also means doing regular breast self-exams. Let your health  care provider know about any changes, no matter how small.  If you are in your 20s or 30s, you should have a clinical breast exam (CBE) by a health care provider every 1-3 years as part of a regular health exam.  If you are 66 or older, have a CBE every year. Also consider having a breast X-ray (mammogram) every year.  If you have a family history of breast cancer, talk to your health care provider about genetic screening.  If you are at high risk for breast cancer, talk to your health care provider about having an MRI and a mammogram every year.  Breast cancer gene (BRCA) assessment is recommended for women who have family members with BRCA-related cancers. BRCA-related cancers include:  Breast.  Ovarian.  Tubal.  Peritoneal cancers.  Results of the assessment will determine the need for genetic counseling and BRCA1 and BRCA2 testing. Cervical Cancer Routine pelvic examinations to screen for cervical cancer are no longer recommended for nonpregnant women who are considered low risk for cancer of the pelvic organs (ovaries, uterus, and vagina) and who do not have symptoms. A pelvic examination may be necessary if you have symptoms including those associated with pelvic infections. Ask your health care provider if a screening pelvic exam is right for you.   The Pap test is the screening test for cervical cancer for women who are considered at risk.  If you had a hysterectomy for a problem that was not cancer or a condition that could lead to cancer, then you no longer need Pap tests.  If you are older than 65 years, and you have had normal Pap tests for the past 10 years, you no longer need to have Pap tests.  If you have had past treatment for cervical cancer or a condition that could lead to cancer, you need Pap tests and screening for cancer for at least 20 years after your treatment.  If you no longer get a Pap test, assess your risk factors if they change (such as having a new  sexual partner). This can affect whether you should start being screened again.  Some women have medical problems that increase their chance of getting cervical cancer. If this is the case for you, your health care provider may recommend more frequent screening and Pap tests.  The human papillomavirus (HPV) test is another test that may be used for cervical cancer screening. The HPV test looks for the virus that can cause cell changes in the cervix. The cells collected during the Pap test can be tested for HPV.  The HPV test can be used to screen women 41 years of age and older. Getting tested for HPV can extend the interval between normal Pap tests from three to five years.  An HPV test also should be used to screen women of any age who  have unclear Pap test results.  After 79 years of age, women should have HPV testing as often as Pap tests.  Colorectal Cancer  This type of cancer can be detected and often prevented.  Routine colorectal cancer screening usually begins at 79 years of age and continues through 79 years of age.  Your health care provider may recommend screening at an earlier age if you have risk factors for colon cancer.  Your health care provider may also recommend using home test kits to check for hidden blood in the stool.  A small camera at the end of a tube can be used to examine your colon directly (sigmoidoscopy or colonoscopy). This is done to check for the earliest forms of colorectal cancer.  Routine screening usually begins at age 1.  Direct examination of the colon should be repeated every 5-10 years through 79 years of age. However, you may need to be screened more often if early forms of precancerous polyps or small growths are found. Skin Cancer  Check your skin from head to toe regularly.  Tell your health care provider about any new moles or changes in moles, especially if there is a change in a mole's shape or color.  Also tell your health care  provider if you have a mole that is larger than the size of a pencil eraser.  Always use sunscreen. Apply sunscreen liberally and repeatedly throughout the day.  Protect yourself by wearing long sleeves, pants, a wide-brimmed hat, and sunglasses whenever you are outside. HEART DISEASE, DIABETES, AND HIGH BLOOD PRESSURE   Have your blood pressure checked at least every 1-2 years. High blood pressure causes heart disease and increases the risk of stroke.  If you are between 74 years and 39 years old, ask your health care provider if you should take aspirin to prevent strokes.  Have regular diabetes screenings. This involves taking a blood sample to check your fasting blood sugar level.  If you are at a normal weight and have a low risk for diabetes, have this test once every three years after 79 years of age.  If you are overweight and have a high risk for diabetes, consider being tested at a younger age or more often. PREVENTING INFECTION  Hepatitis B  If you have a higher risk for hepatitis B, you should be screened for this virus. You are considered at high risk for hepatitis B if:  You were born in a country where hepatitis B is common. Ask your health care provider which countries are considered high risk.  Your parents were born in a high-risk country, and you have not been immunized against hepatitis B (hepatitis B vaccine).  You have HIV or AIDS.  You use needles to inject street drugs.  You live with someone who has hepatitis B.  You have had sex with someone who has hepatitis B.  You get hemodialysis treatment.  You take certain medicines for conditions, including cancer, organ transplantation, and autoimmune conditions. Hepatitis C  Blood testing is recommended for:  Everyone born from [birth year] through 1963.  Anyone with known risk factors for hepatitis C. Sexually transmitted infections (STIs)  You should be screened for sexually transmitted infections (STIs)  including gonorrhea and chlamydia if:  You are sexually active and are younger than 79 years of age.  You are older than 79 years of age and your health care provider tells you that you are at risk for this type of infection.  Your sexual activity has  changed since you were last screened and you are at an increased risk for chlamydia or gonorrhea. Ask your health care provider if you are at risk.  If you do not have HIV, but are at risk, it may be recommended that you take a prescription medicine daily to prevent HIV infection. This is called pre-exposure prophylaxis (PrEP). You are considered at risk if:  You are sexually active and do not regularly use condoms or know the HIV status of your partner(s).  You take drugs by injection.  You are sexually active with a partner who has HIV. Talk with your health care provider about whether you are at high risk of being infected with HIV. If you choose to begin PrEP, you should first be tested for HIV. You should then be tested every 3 months for as long as you are taking PrEP.  PREGNANCY   If you are premenopausal and you may become pregnant, ask your health care provider about preconception counseling.  If you may become pregnant, take 400 to 800 micrograms (mcg) of folic acid every day.  If you want to prevent pregnancy, talk to your health care provider about birth control (contraception). OSTEOPOROSIS AND MENOPAUSE   Osteoporosis is a disease in which the bones lose minerals and strength with aging. This can result in serious bone fractures. Your risk for osteoporosis can be identified using a bone density scan.  If you are 100 years of age or older, or if you are at risk for osteoporosis and fractures, ask your health care provider if you should be screened.  Ask your health care provider whether you should take a calcium or vitamin D supplement to lower your risk for osteoporosis.  Menopause may have certain physical symptoms and  risks.  Hormone replacement therapy may reduce some of these symptoms and risks. Talk to your health care provider about whether hormone replacement therapy is right for you.  HOME CARE INSTRUCTIONS   Schedule regular health, dental, and eye exams.  Stay current with your immunizations.   Do not use any tobacco products including cigarettes, chewing tobacco, or electronic cigarettes.  If you are pregnant, do not drink alcohol.  If you are breastfeeding, limit how much and how often you drink alcohol.  Limit alcohol intake to no more than 1 drink per day for nonpregnant women. One drink equals 12 ounces of beer, 5 ounces of wine, or 1 ounces of hard liquor.  Do not use street drugs.  Do not share needles.  Ask your health care provider for help if you need support or information about quitting drugs.  Tell your health care provider if you often feel depressed.  Tell your health care provider if you have ever been abused or do not feel safe at home. Document Released: 05/06/2011 Document Revised: 03/07/2014 Document Reviewed: 09/22/2013 [facility] Patient Information 2015 [place], [place]. This information is not intended to replace advice given to you by your health care provider. Make sure you discuss any questions you have with your health care provider.

## 2015-07-26 NOTE — Progress Notes (Signed)
Patient ID: JOANIE DUPREY, female   DOB: 01/17/1933, 81 y.o.   MRN: [id number]    Subjective:   MASAYO FERA is a 81 y.o. female who presents for a Subsequent Medicare Annual Wellness Visit. He daughter is present at visit with her.    Mrs. Tieszen is pleasant and in NAD.     Current Medications (verified) Outpatient Encounter Prescriptions as of 07/26/2015  Medication Sig  . amLODipine (NORVASC) 10 MG tablet Take 1 tablet (10 mg total) by mouth daily.  Marland Kitchen atorvastatin (LIPITOR) 10 MG tablet Take 1 tablet (10 mg total) by mouth daily at 6 PM.  . B Complex Vitamins (B COMPLEX-B12) TABS Take 1 tablet by mouth daily.    Marland Kitchen dipyridamole-aspirin (AGGRENOX) 200-25 MG per 12 hr capsule Take 1 capsule by mouth 2 (two) times daily.  Marland Kitchen glipiZIDE (GLIPIZIDE XL) 10 MG 24 hr tablet Take 1 tablet (10 mg total) by mouth daily with breakfast.  . insulin glargine (LANTUS) 100 UNIT/ML injection Inject 0.41 mLs (41 Units total) into the skin every morning. (Patient taking differently: Inject 41 Units into the skin at bedtime. )  . Insulin Pen Needle (DROPLET PEN NEEDLES) 31G X 6 MM MISC 1 each by Does not apply route daily.  Marland Kitchen LORazepam (ATIVAN) 0.5 MG tablet TAKE  (1)  TABLET TWICE A DAY.  . Magnesium 250 MG TABS Take 1 tablet by mouth daily.    . metoprolol succinate (TOPROL-XL) 100 MG 24 hr tablet Take 1 tablet (100 mg total) by mouth daily. Take with or immediately following a meal.  . omega-3 fish oil (MAXEPA) 1000 MG CAPS capsule Take 1 capsule by mouth daily.    . ONE TOUCH ULTRA TEST test strip CHECK BLOOD SUGAR 3 TIMES A DAY OR AS DIRECTED  . pantoprazole (PROTONIX) 40 MG tablet Take 1 tablet (40 mg total) by mouth daily.  . valsartan-hydrochlorothiazide (DIOVAN-HCT) 320-25 MG per tablet Take 1 tablet by mouth daily.  . vitamin D, CHOLECALCIFEROL, 400 UNITS tablet Take 400 Units by mouth daily.     No facility-administered encounter medications on file as of 07/26/2015.    Allergies  (verified) Codeine; Latex; Morphine; Niacin; Sulfa antibiotics; and Sulfonamide derivatives   History: Past Medical History  Diagnosis Date  . Hypertension   . History of TIAs   . Dyslipidemia   . CVA (cerebral infarction)   . Diabetes mellitus     type 2  . Depression   . Osteoarthritis   . Carotid bruit   . Hyponatremia   . Hyperlipidemia   . Anxiety   . Cataract   . GERD (gastroesophageal reflux disease)   . Crohn's colitis    Past Surgical History  Procedure Laterality Date  . Back surgery  1994  . Vesicovaginal fistula closure w/ tah  1984  . Tubal ligation    . Abdominal hysterectomy    . Colon resection  1990's   Family History  Problem Relation Age of Onset  . Coronary artery disease Sister     stent x 3  . Seizures Sister   . Coronary artery disease Brother     stent  . Hypertension Mother   . Stroke Mother   . Emphysema Father   . Alcohol abuse Brother   . Lung disease Brother    Social History   Occupational History  . Not on file.   Social History Main Topics  . Smoking status: Never Smoker   . Smokeless tobacco: Never Used  .  Alcohol Use: No  . Drug Use: No  . Sexual Activity: No    Do you feel safe at home?  Yes  Dietary issues and exercise activities: Current Exercise Habits:: The patient does not participate in regular exercise at present  Current Dietary habits:  Patient report drinking 2 Boost a day.  Upon further questioning it was determined that patinet is doing this and eating soft foods due to poor fit of her dentures.    Objective:    Today's Vitals   07/26/15 1606  BP: 124/56  Pulse: 60  Height: 4' 11.5" (1.511 m)  Weight: 130 lb (58.968 kg)  PainSc: 0-No pain   Although patient denies pain today her daughter mentions that Mrs. Tilley has c/o left foot pain which radiated into her toes.  She requests referral to podiatrist.  Body mass index is 25.83 kg/(m^2).   Patient reports HBG readings in the early AM that are  sometimes in the 50's and 60's  Activities of Daily Living In your present state of health, do you have any difficulty performing the following activities: 07/26/2015 06/23/2015  Hearing? N N  Vision? N N  Difficulty concentrating or making decisions? N N  Walking or climbing stairs? N N  Dressing or bathing? N N  Doing errands, shopping? N N  Preparing Food and eating ? Y -  Using the Toilet? N -  In the past six months, have you accidently leaked urine? N -  Do you have problems with loss of bowel control? N -  Managing your Medications? N -  Managing your Finances? N -  Housekeeping or managing your Housekeeping? N -    Are there smokers in your home (other than you)? No   Cardiac Risk Factors include: advanced age (>33mn, >>58women);diabetes mellitus;dyslipidemia;family history of premature cardiovascular disease;hypertension;sedentary lifestyle  Depression Screen PHQ 2/9 Scores 07/26/2015 06/23/2015 03/09/2015 12/05/2014  PHQ - 2 Score 2 5 0 1  PHQ- 9 Score 10 20 - -    Fall Risk Fall Risk  07/26/2015 06/23/2015 03/09/2015 12/05/2014 09/02/2014  Falls in the past year? No No No No Yes  Number falls in past yr: - - - - 1  Injury with Fall? - - - - No  Risk Factor Category  - - - - -  Risk for fall due to : - - - - -  Risk for fall due to (comments): - - - - -    Cognitive Function: MMSE - Mini Mental State Exam 07/26/2015  Orientation to time 5  Orientation to Place 5  Registration 3  Attention/ Calculation 3  Recall 3  Language- name 2 objects 2  Language- repeat 1  Language- follow 3 step command 2  Language- read & follow direction 1  Write a sentence 1  Copy design 1  Total score 27    Immunizations and Health Maintenance Immunization History  Administered Date(s) Administered  . Influenza Split 07/23/2013  . Influenza-Unspecified 07/18/2014  . Pneumococcal Conjugate-13 07/18/2014  . Pneumococcal Polysaccharide-23 08/04/2008  . Td 06/11/2005  . Tdap 08/15/2014    Health Maintenance Due  Topic Date Due  . INFLUENZA VACCINE  06/05/2015    Patient Care Team: MChevis Pretty FTen Broeckas PCP - General (Nurse Practitioner) DJules Schick NP as Occupational Therapist (Occupational Therapy) DOkey Regal OHerndonas Consulting Physician (Optometry) MSherren Mocha MD as Consulting Physician (Cardiology)  Indicate any recent Medical Services you may have received from other than [facility] providers in the  past year (date may be approximate).    Assessment:    Annual Wellness Visit  Type 2 DM requiring insulin and uncontrolled - having hypoglycemic on regular basis.  B12 deficiency Fatigue   Screening Tests Health Maintenance  Topic Date Due  . INFLUENZA VACCINE  06/05/2015  . OPHTHALMOLOGY EXAM  08/06/2015  . URINE MICROALBUMIN  09/27/2015  . HEMOGLOBIN A1C  12/24/2015  . FOOT EXAM  06/22/2016  . COLONOSCOPY  03/04/2021  . TETANUS/TDAP  08/15/2024  . DEXA SCAN  Completed  . ZOSTAVAX  Completed  . PNA vac Low Risk Adult  Completed        Plan:   During the course of the visit Floyce was educated and counseled about the following appropriate screening and preventive services:   Vaccines to include Pneumoccal, Influenza, Hepatitis B, Td, Zostavax - all vaccines UTD  Colorectal cancer screening - FOBT given in office today for patient to return later.  Colonoscopy UTD  Cardiovascular disease screening - lipids at goal. BP at goal  Change amlodipine 9m - change to taking qpm  Diabetes - switch Lantus from evening to morning.  Please call me for adjustment if you continue to experience hypoglycemia (BG < 70)  Bone Denisty / Osteoporosis Screening - UTD  Mammogram - to get in October  Glaucoma screening / Diabetic Eye Exam - Due in October  Nutrition counseling - Discussed diet - patient is urged to have dentures adjusted   Continue to work in garden.  Try to add walking or go to [employer]  Referral to Dr DIrving Showsfor diabetic  foot evaluation  Advanced Directives - packet given and discussed with patient and daughter Orders Placed This Encounter  Procedures  . Anemia Profile B  . Thyroid Panel With TSH  . Magnesium     Patient Instructions (the written plan) were given to the patient.   ECherre Robins PSan [facility]  07/26/2015

## 2015-07-27 LAB — ANEMIA PROFILE B
BASOS ABS: 0 10*3/uL (ref 0.0–0.2)
Basos: 0 %
EOS (ABSOLUTE): 0.3 10*3/uL (ref 0.0–0.4)
Eos: 3 %
Ferritin: 58 ng/mL (ref 15–150)
Hematocrit: 36.2 % (ref 34.0–46.6)
Hemoglobin: 12.3 g/dL (ref 11.1–15.9)
IMMATURE GRANULOCYTES: 0 %
IRON: 42 ug/dL (ref 27–139)
Immature Grans (Abs): 0 10*3/uL (ref 0.0–0.1)
Iron Saturation: 14 % — ABNORMAL LOW (ref 15–55)
LYMPHS ABS: 1.2 10*3/uL (ref 0.7–3.1)
LYMPHS: 15 %
MCH: 30.5 pg (ref 26.6–33.0)
MCHC: 34 g/dL (ref 31.5–35.7)
MCV: 90 fL (ref 79–97)
Monocytes Absolute: 0.9 10*3/uL (ref 0.1–0.9)
Monocytes: 11 %
NEUTROS PCT: 71 %
Neutrophils Absolute: 5.9 10*3/uL (ref 1.4–7.0)
PLATELETS: 235 10*3/uL (ref 150–379)
RBC: 4.03 x10E6/uL (ref 3.77–5.28)
RDW: 13.8 % (ref 12.3–15.4)
Retic Ct Pct: 1.1 % (ref 0.6–2.6)
TIBC: 304 ug/dL (ref 250–450)
UIBC: 262 ug/dL (ref 118–369)
Vitamin B-12: 959 pg/mL — ABNORMAL HIGH (ref 211–946)
WBC: 8.3 10*3/uL (ref 3.4–10.8)

## 2015-07-27 LAB — THYROID PANEL WITH TSH
Free Thyroxine Index: 2.6 (ref 1.2–4.9)
T3 Uptake Ratio: 30 % (ref 24–39)
T4 TOTAL: 8.5 ug/dL (ref 4.5–12.0)
TSH: 1.54 u[IU]/mL (ref 0.450–4.500)

## 2015-07-27 LAB — MAGNESIUM: MAGNESIUM: 2.2 mg/dL (ref 1.6–2.3)

## 2015-07-31 ENCOUNTER — Telehealth: Payer: Self-pay | Admitting: Nurse Practitioner

## 2015-07-31 NOTE — Telephone Encounter (Signed)
B12 slightly elevated (patient is taking an oral B12 supplement). Also lo iron sat slightly low.  No changes recommended. TSH and magnesium were both WNL. Patient notified by phone.

## 2015-08-08 DIAGNOSIS — L84 Corns and callosities: Secondary | ICD-10-CM | POA: Diagnosis not present

## 2015-08-08 DIAGNOSIS — B351 Tinea unguium: Secondary | ICD-10-CM | POA: Diagnosis not present

## 2015-08-08 DIAGNOSIS — E1142 Type 2 diabetes mellitus with diabetic polyneuropathy: Secondary | ICD-10-CM | POA: Diagnosis not present

## 2015-09-25 ENCOUNTER — Other Ambulatory Visit: Payer: Self-pay | Admitting: Family Medicine

## 2015-09-26 ENCOUNTER — Ambulatory Visit (INDEPENDENT_AMBULATORY_CARE_PROVIDER_SITE_OTHER): Payer: Medicare Other | Admitting: Nurse Practitioner

## 2015-09-26 ENCOUNTER — Encounter: Payer: Self-pay | Admitting: Nurse Practitioner

## 2015-09-26 VITALS — BP 136/84 | HR 51 | Temp 96.7°F | Ht 59.0 in | Wt 124.0 lb

## 2015-09-26 DIAGNOSIS — F411 Generalized anxiety disorder: Secondary | ICD-10-CM | POA: Diagnosis not present

## 2015-09-26 DIAGNOSIS — I779 Disorder of arteries and arterioles, unspecified: Secondary | ICD-10-CM | POA: Diagnosis not present

## 2015-09-26 DIAGNOSIS — K219 Gastro-esophageal reflux disease without esophagitis: Secondary | ICD-10-CM | POA: Diagnosis not present

## 2015-09-26 DIAGNOSIS — E1142 Type 2 diabetes mellitus with diabetic polyneuropathy: Secondary | ICD-10-CM

## 2015-09-26 DIAGNOSIS — N1831 Chronic kidney disease, stage 3a: Secondary | ICD-10-CM

## 2015-09-26 DIAGNOSIS — N183 Chronic kidney disease, stage 3 (moderate): Secondary | ICD-10-CM

## 2015-09-26 DIAGNOSIS — Z794 Long term (current) use of insulin: Secondary | ICD-10-CM | POA: Diagnosis not present

## 2015-09-26 DIAGNOSIS — I10 Essential (primary) hypertension: Secondary | ICD-10-CM

## 2015-09-26 DIAGNOSIS — I739 Peripheral vascular disease, unspecified: Secondary | ICD-10-CM

## 2015-09-26 DIAGNOSIS — E785 Hyperlipidemia, unspecified: Secondary | ICD-10-CM | POA: Diagnosis not present

## 2015-09-26 DIAGNOSIS — F32A Depression, unspecified: Secondary | ICD-10-CM

## 2015-09-26 DIAGNOSIS — E119 Type 2 diabetes mellitus without complications: Secondary | ICD-10-CM

## 2015-09-26 DIAGNOSIS — F329 Major depressive disorder, single episode, unspecified: Secondary | ICD-10-CM

## 2015-09-26 LAB — POCT GLYCOSYLATED HEMOGLOBIN (HGB A1C): Hemoglobin A1C: 7.4

## 2015-09-26 NOTE — Progress Notes (Signed)
Subjective:    Patient ID: Kara Dean, female    DOB: September 27, 1933, 81 y.o.   MRN: [id number]  Patient presents today for followup of chronic problems.No complaints today. No med changes since  Last visit.  Hypertension This is a chronic problem. The current episode started more than 1 year ago. Associated symptoms include palpitations (Intermittent - goes away with rest). Pertinent negatives include no chest pain, headaches or shortness of breath. Risk factors for coronary artery disease include diabetes mellitus, dyslipidemia and post-menopausal state. Past treatments include ACE inhibitors.  Hyperlipidemia This is a chronic problem. The current episode started more than 1 year ago. Pertinent negatives include no chest pain, myalgias or shortness of breath. Risk factors for coronary artery disease include hypertension, diabetes mellitus and post-menopausal.  Diabetes She presents for her follow-up diabetic visit. She has type 2 diabetes mellitus. Pertinent negatives for hypoglycemia include no headaches. Pertinent negatives for diabetes include no chest pain, no polydipsia, no polyphagia, no polyuria and no weakness. Risk factors for coronary artery disease include post-menopausal and diabetes mellitus. Her breakfast blood glucose is taken between 8-9 am. Her breakfast blood glucose range is generally 90-110 mg/dl. Her dinner blood glucose is taken between 5-6 pm. Her overall blood glucose range is >200 mg/dl.  GAD/depression Takes lorzepam as needed. Has not had a recent in a few weeks.  GERD No symptoms as long as taking Nexium.  Hx CVA Takes aggrenox- No bleeding   Review of Systems  Constitutional: Negative.   HENT: Negative.   Respiratory: Negative for shortness of breath.   Cardiovascular: Positive for palpitations (Intermittent - goes away with rest). Negative for chest pain.  Endocrine: Negative for polydipsia, polyphagia and polyuria.  Musculoskeletal: Negative for myalgias.   Neurological: Negative for weakness and headaches.  Psychiatric/Behavioral: Negative.   All other systems reviewed and are negative.      Objective:   Physical Exam  Constitutional: She is oriented to person, place, and time. She appears well-developed and well-nourished.  HENT:  Right Ear: External ear normal.  Left Ear: External ear normal.  Nose: Nose normal.  Mouth/Throat: Oropharynx is clear and moist.  Eyes: EOM are normal.  Neck: Trachea normal, normal range of motion and full passive range of motion without pain. Neck supple. No JVD present. Carotid bruit is not present. No thyromegaly present.  Cardiovascular: Normal rate, regular rhythm, normal heart sounds and intact distal pulses.  Exam reveals no gallop and no friction rub.   No murmur heard. Pulmonary/Chest: Effort normal and breath sounds normal.  Abdominal: Soft. Bowel sounds are normal. She exhibits no distension and no mass. There is no tenderness.  Musculoskeletal: Normal range of motion.  Lymphadenopathy:    She has no cervical adenopathy.  Neurological: She is alert and oriented to person, place, and time. She has normal reflexes.  Skin: Skin is warm and dry.  Psychiatric: She has a normal mood and affect. Her behavior is normal. Judgment and thought content normal.   BP 136/84 mmHg  Pulse 51  Temp(Src) 96.7 F (35.9 C) (Oral)  Ht 4' 11"  (1.499 m)  Wt 124 lb (56.246 kg)  BMI 25.03 kg/m2     Results for orders placed or performed in visit on 09/26/15  POCT glycosylated hemoglobin (Hb A1C)  Result Value Ref Range   Hemoglobin A1C 7.4         Assessment & Plan:   1. Hyperlipidemia Low fat diet - Lipid panel  2. Essential hypertension Do nt add  salt to diet - CMP14+EGFR  3. Type 2 diabetes mellitus with diabetic polyneuropathy, unspecified long term insulin use status (HCC) Continue to watch carbs in diet - POCT glycosylated hemoglobin (Hb A1C) - Microalbumin / creatinine urine ratio  4.  Carotid artery disease without cerebral infarction [facility]) Keep follow up with cardiologist  5. Gastroesophageal reflux disease without esophagitis Avoid spicy foods Do not eat 2 hours prior to bedtime  6. Type 2 diabetes mellitus treated with insulin (HCC) cpontinue insulin as rx  7. CKD stage G3a/A3, GFR 45 - 59 and albumin creatinine ratio >300 mg/g  8. Depression Stress management  9. GAD (generalized anxiety disorder)   Encouraged to get eye exam Labs pending Health maintenance reviewed Diet and exercise encouraged Continue all meds Follow up  In 3 months   [place], FNP

## 2015-09-26 NOTE — Patient Instructions (Signed)
Diabetes and Foot Care Diabetes may cause you to have problems because of poor blood supply (circulation) to your feet and legs. This may cause the skin on your feet to become thinner, break easier, and heal more slowly. Your skin may become dry, and the skin may peel and crack. You may also have nerve damage in your legs and feet causing decreased feeling in them. You may not notice minor injuries to your feet that could lead to infections or more serious problems. Taking care of your feet is one of the most important things you can do for yourself.  HOME CARE INSTRUCTIONS  Wear shoes at all times, even in the house. Do not go barefoot. Bare feet are easily injured.  Check your feet daily for blisters, cuts, and redness. If you cannot see the bottom of your feet, use a mirror or ask someone for help.  Wash your feet with warm water (do not use hot water) and mild soap. Then pat your feet and the areas between your toes until they are completely dry. Do not soak your feet as this can dry your skin.  Apply a moisturizing lotion or petroleum jelly (that does not contain alcohol and is unscented) to the skin on your feet and to dry, brittle toenails. Do not apply lotion between your toes.  Trim your toenails straight across. Do not dig under them or around the cuticle. File the edges of your nails with an emery board or nail file.  Do not cut corns or calluses or try to remove them with medicine.  Wear clean socks or stockings every day. Make sure they are not too tight. Do not wear knee-high stockings since they may decrease blood flow to your legs.  Wear shoes that fit properly and have enough cushioning. To break in new shoes, wear them for just a few hours a day. This prevents you from injuring your feet. Always look in your shoes before you put them on to be sure there are no objects inside.  Do not cross your legs. This may decrease the blood flow to your feet.  If you find a minor scrape,  cut, or break in the skin on your feet, keep it and the skin around it clean and dry. These areas may be cleansed with mild soap and water. Do not cleanse the area with peroxide, alcohol, or iodine.  When you remove an adhesive bandage, be sure not to damage the skin around it.  If you have a wound, look at it several times a day to make sure it is healing.  Do not use heating pads or hot water bottles. They may burn your skin. If you have lost feeling in your feet or legs, you may not know it is happening until it is too late.  Make sure your health care provider performs a complete foot exam at least annually or more often if you have foot problems. Report any cuts, sores, or bruises to your health care provider immediately. SEEK MEDICAL CARE IF:   You have an injury that is not healing.  You have cuts or breaks in the skin.  You have an ingrown nail.  You notice redness on your legs or feet.  You feel burning or tingling in your legs or feet.  You have pain or cramps in your legs and feet.  Your legs or feet are numb.  Your feet always feel cold. SEEK IMMEDIATE MEDICAL CARE IF:   There is increasing redness,   swelling, or pain in or around a wound.  There is a red line that goes up your leg.  Pus is coming from a wound.  You develop a fever or as directed by your health care provider.  You notice a bad smell coming from an ulcer or wound.   This information is not intended to replace advice given to you by your health care provider. Make sure you discuss any questions you have with your health care provider.   Document Released: 10/18/2000 Document Revised: 06/23/2013 Document Reviewed: 03/30/2013 Elsevier Interactive Patient Education 2016 [employer]  

## 2015-09-27 LAB — CMP14+EGFR
A/G RATIO: 1.5 (ref 1.1–2.5)
ALT: 11 IU/L (ref 0–32)
AST: 16 IU/L (ref 0–40)
Albumin: 4.4 g/dL (ref 3.5–4.7)
Alkaline Phosphatase: 68 IU/L (ref 39–117)
BILIRUBIN TOTAL: 0.7 mg/dL (ref 0.0–1.2)
BUN/Creatinine Ratio: 11 (ref 11–26)
BUN: 12 mg/dL (ref 8–27)
CALCIUM: 9.9 mg/dL (ref 8.7–10.3)
CHLORIDE: 89 mmol/L — AB (ref 97–106)
CO2: 26 mmol/L (ref 18–29)
Creatinine, Ser: 1.11 mg/dL — ABNORMAL HIGH (ref 0.57–1.00)
GFR, EST AFRICAN AMERICAN: 54 mL/min/{1.73_m2} — AB (ref >59)
GFR, EST NON AFRICAN AMERICAN: 47 mL/min/{1.73_m2} — AB (ref >59)
GLOBULIN, TOTAL: 2.9 g/dL (ref 1.5–4.5)
Glucose: 155 mg/dL — ABNORMAL HIGH (ref 65–99)
Potassium: 4.4 mmol/L (ref 3.5–5.2)
SODIUM: 129 mmol/L — AB (ref 136–144)
TOTAL PROTEIN: 7.3 g/dL (ref 6.0–8.5)

## 2015-09-27 LAB — LIPID PANEL
CHOL/HDL RATIO: 2.6 ratio (ref 0.0–4.4)
Cholesterol, Total: 102 mg/dL (ref 100–199)
HDL: 40 mg/dL (ref >39)
LDL Calculated: 32 mg/dL (ref 0–99)
Triglycerides: 149 mg/dL (ref 0–149)
VLDL Cholesterol Cal: 30 mg/dL (ref 5–40)

## 2015-09-27 LAB — MICROALBUMIN / CREATININE URINE RATIO
CREATININE, UR: 160 mg/dL
MICROALB/CREAT RATIO: 12.8 mg/g{creat} (ref 0.0–30.0)
MICROALBUM., U, RANDOM: 20.4 ug/mL

## 2015-10-17 DIAGNOSIS — L84 Corns and callosities: Secondary | ICD-10-CM | POA: Diagnosis not present

## 2015-10-17 DIAGNOSIS — B351 Tinea unguium: Secondary | ICD-10-CM | POA: Diagnosis not present

## 2015-10-17 DIAGNOSIS — E1142 Type 2 diabetes mellitus with diabetic polyneuropathy: Secondary | ICD-10-CM | POA: Diagnosis not present

## 2015-11-02 ENCOUNTER — Encounter: Payer: Self-pay | Admitting: Nurse Practitioner

## 2015-11-02 ENCOUNTER — Ambulatory Visit (INDEPENDENT_AMBULATORY_CARE_PROVIDER_SITE_OTHER): Payer: Medicare Other | Admitting: Nurse Practitioner

## 2015-11-02 VITALS — BP 149/61 | HR 84 | Temp 97.1°F | Ht 59.0 in | Wt 124.0 lb

## 2015-11-02 DIAGNOSIS — S81802A Unspecified open wound, left lower leg, initial encounter: Secondary | ICD-10-CM | POA: Diagnosis not present

## 2015-11-02 DIAGNOSIS — S81812A Laceration without foreign body, left lower leg, initial encounter: Secondary | ICD-10-CM

## 2015-11-02 NOTE — Patient Instructions (Signed)

## 2015-11-02 NOTE — Progress Notes (Signed)
   Subjective:    Patient ID: Kara Dean, female    DOB: 1933-01-04, 81 y.o.   MRN: [id number]  HPI  Patient here stating that she bumped into a buggy at the store and scraped her left leg- wanted [place] to check it out since she is a diabetic.   Review of Systems  Constitutional: Negative.   Respiratory: Negative.   Cardiovascular: Negative.   Neurological: Negative.   Psychiatric/Behavioral: Negative.   All other systems reviewed and are negative.      Objective:   Physical Exam  Constitutional: She appears well-developed and well-nourished.  Cardiovascular: Normal rate, regular rhythm and normal heart sounds.   Pulmonary/Chest: Effort normal and breath sounds normal.  Skin:  Superficial skin tearon left lower shin- dead skin removed and wound cleaned and dressed.  Psychiatric: She has a normal mood and affect. Her behavior is normal. Judgment and thought content normal.   BP 149/61 mmHg  Pulse 84  Temp(Src) 97.1 F (36.2 C) (Oral)  Ht 4' 11"  (1.499 m)  Wt 124 lb (56.246 kg)  BMI 25.03 kg/m2        Assessment & Plan:   1. Skin tear of left lower leg without complication, initial encounter    Keep clean and dry Watch for signs of infection RTO prn  [name], FNP

## 2015-12-26 DIAGNOSIS — L84 Corns and callosities: Secondary | ICD-10-CM | POA: Diagnosis not present

## 2015-12-26 DIAGNOSIS — E1142 Type 2 diabetes mellitus with diabetic polyneuropathy: Secondary | ICD-10-CM | POA: Diagnosis not present

## 2015-12-26 DIAGNOSIS — B351 Tinea unguium: Secondary | ICD-10-CM | POA: Diagnosis not present

## 2015-12-28 ENCOUNTER — Encounter: Payer: Self-pay | Admitting: Nurse Practitioner

## 2015-12-28 ENCOUNTER — Ambulatory Visit (INDEPENDENT_AMBULATORY_CARE_PROVIDER_SITE_OTHER): Payer: Medicare Other | Admitting: Nurse Practitioner

## 2015-12-28 VITALS — BP 127/65 | HR 55 | Temp 97.9°F | Ht 59.0 in | Wt 123.0 lb

## 2015-12-28 DIAGNOSIS — E785 Hyperlipidemia, unspecified: Secondary | ICD-10-CM | POA: Diagnosis not present

## 2015-12-28 DIAGNOSIS — K219 Gastro-esophageal reflux disease without esophagitis: Secondary | ICD-10-CM

## 2015-12-28 DIAGNOSIS — F329 Major depressive disorder, single episode, unspecified: Secondary | ICD-10-CM | POA: Diagnosis not present

## 2015-12-28 DIAGNOSIS — I2510 Atherosclerotic heart disease of native coronary artery without angina pectoris: Secondary | ICD-10-CM | POA: Diagnosis not present

## 2015-12-28 DIAGNOSIS — G459 Transient cerebral ischemic attack, unspecified: Secondary | ICD-10-CM

## 2015-12-28 DIAGNOSIS — E1142 Type 2 diabetes mellitus with diabetic polyneuropathy: Secondary | ICD-10-CM | POA: Diagnosis not present

## 2015-12-28 DIAGNOSIS — F411 Generalized anxiety disorder: Secondary | ICD-10-CM | POA: Diagnosis not present

## 2015-12-28 DIAGNOSIS — I10 Essential (primary) hypertension: Secondary | ICD-10-CM

## 2015-12-28 DIAGNOSIS — F32A Depression, unspecified: Secondary | ICD-10-CM

## 2015-12-28 DIAGNOSIS — E119 Type 2 diabetes mellitus without complications: Secondary | ICD-10-CM

## 2015-12-28 DIAGNOSIS — Z794 Long term (current) use of insulin: Secondary | ICD-10-CM

## 2015-12-28 LAB — POCT GLYCOSYLATED HEMOGLOBIN (HGB A1C): Hemoglobin A1C: 7.5

## 2015-12-28 MED ORDER — VALSARTAN-HYDROCHLOROTHIAZIDE 320-25 MG PO TABS: 1.0000 | ORAL_TABLET | Freq: Every day | ORAL | Status: DC

## 2015-12-28 MED ORDER — GLIPIZIDE ER 10 MG PO TB24: 10.0000 mg | ORAL_TABLET | Freq: Every day | ORAL | Status: DC

## 2015-12-28 MED ORDER — PANTOPRAZOLE SODIUM 40 MG PO TBEC: 40.0000 mg | DELAYED_RELEASE_TABLET | Freq: Every day | ORAL | Status: DC

## 2015-12-28 MED ORDER — ASPIRIN-DIPYRIDAMOLE ER 25-200 MG PO CP12: 1.0000 | ORAL_CAPSULE | Freq: Two times a day (BID) | ORAL | Status: DC

## 2015-12-28 MED ORDER — ATORVASTATIN CALCIUM 10 MG PO TABS: 10.0000 mg | ORAL_TABLET | Freq: Every day | ORAL | Status: DC

## 2015-12-28 MED ORDER — AMLODIPINE BESYLATE 10 MG PO TABS: 10.0000 mg | ORAL_TABLET | Freq: Every day | ORAL | Status: DC

## 2015-12-28 MED ORDER — LORAZEPAM 0.5 MG PO TABS: ORAL_TABLET | ORAL | Status: DC

## 2015-12-28 MED ORDER — INSULIN GLARGINE 100 UNIT/ML ~~LOC~~ SOLN: 41.0000 [IU] | Freq: Every morning | SUBCUTANEOUS | Status: DC

## 2015-12-28 MED ORDER — METOPROLOL SUCCINATE ER 100 MG PO TB24: 100.0000 mg | ORAL_TABLET | Freq: Every day | ORAL | Status: DC

## 2015-12-28 NOTE — Progress Notes (Addendum)
Subjective:    Patient ID: Kara Dean, female    DOB: 08-Feb-1933, 82 y.o.   MRN: [id number]  Patient here today for follow up of chronic medical problems.  Outpatient Encounter Prescriptions as of 12/28/2015  Medication Sig  . amLODipine (NORVASC) 10 MG tablet Take 1 tablet (10 mg total) by mouth daily.  Marland Kitchen atorvastatin (LIPITOR) 10 MG tablet Take 1 tablet (10 mg total) by mouth daily at 6 PM.  . B Complex Vitamins (B COMPLEX-B12) TABS Take 1 tablet by mouth daily.    Marland Kitchen dipyridamole-aspirin (AGGRENOX) 200-25 MG per 12 hr capsule Take 1 capsule by mouth 2 (two) times daily.  Marland Kitchen glipiZIDE (GLIPIZIDE XL) 10 MG 24 hr tablet Take 1 tablet (10 mg total) by mouth daily with breakfast.  . insulin glargine (LANTUS) 100 UNIT/ML injection Inject 0.41 mLs (41 Units total) into the skin every morning. (Patient taking differently: Inject 41 Units into the skin at bedtime. )  . Insulin Pen Needle (DROPLET PEN NEEDLES) 31G X 6 MM MISC 1 each by Does not apply route daily.  Marland Kitchen LORazepam (ATIVAN) 0.5 MG tablet TAKE  (1)  TABLET TWICE A DAY.  . Magnesium 250 MG TABS Take 1 tablet by mouth daily.    . metoprolol succinate (TOPROL-XL) 100 MG 24 hr tablet Take 1 tablet (100 mg total) by mouth daily. Take with or immediately following a meal.  . omega-3 fish oil (MAXEPA) 1000 MG CAPS capsule Take 1 capsule by mouth daily.    . ONE TOUCH ULTRA TEST test strip CHECK BLOOD SUGAR 3 TIMES A DAY OR AS DIRECTED  . pantoprazole (PROTONIX) 40 MG tablet Take 1 tablet (40 mg total) by mouth daily.  . valsartan-hydrochlorothiazide (DIOVAN-HCT) 320-25 MG per tablet Take 1 tablet by mouth daily.  . vitamin D, CHOLECALCIFEROL, 400 UNITS tablet Take 400 Units by mouth daily.     No facility-administered encounter medications on file as of 12/28/2015.      Hypertension This is a chronic problem. The current episode started more than 1 year ago. Associated symptoms include palpitations (Intermittent - goes away with rest).  Pertinent negatives include no chest pain, headaches or shortness of breath. Risk factors for coronary artery disease include diabetes mellitus, dyslipidemia and post-menopausal state. Past treatments include ACE inhibitors. The current treatment provides moderate improvement. Compliance problems include diet and exercise.  Hypertensive end-organ damage includes CVA. There is no history of CAD/MI.  Hyperlipidemia This is a chronic problem. The current episode started more than 1 year ago. Pertinent negatives include no chest pain, myalgias or shortness of breath. Risk factors for coronary artery disease include hypertension, diabetes mellitus and post-menopausal.  Diabetes She presents for her follow-up diabetic visit. She has type 2 diabetes mellitus. Pertinent negatives for hypoglycemia include no headaches. Pertinent negatives for diabetes include no chest pain, no polydipsia, no polyphagia, no polyuria and no weakness. Diabetic complications include a CVA. Risk factors for coronary artery disease include post-menopausal and diabetes mellitus. Her breakfast blood glucose is taken between 8-9 am. Her breakfast blood glucose range is generally 90-110 mg/dl. Her dinner blood glucose is taken between 5-6 pm. Her overall blood glucose range is >200 mg/dl.  GAD/depression Takes lorzepam as needed. Has not had a recent in a few weeks.  GERD No symptoms as long as taking Nexium.  Hx CVA Takes aggrenox- No bleeding CAD Currently on aggrenox and metoprolol daily- sees cardiologist every 6  onths and prn- no recent chest pain. CKD/stage 3 Watching creatine  for now   Review of Systems  Constitutional: Negative.   HENT: Negative.   Respiratory: Negative for shortness of breath.   Cardiovascular: Positive for palpitations (Intermittent - goes away with rest). Negative for chest pain.  Endocrine: Negative for polydipsia, polyphagia and polyuria.  Musculoskeletal: Negative for myalgias.  Neurological:  Negative for weakness and headaches.  Psychiatric/Behavioral: Negative.   All other systems reviewed and are negative.      Objective:   Physical Exam  Constitutional: She is oriented to person, place, and time. She appears well-developed and well-nourished.  HENT:  Right Ear: External ear normal.  Left Ear: External ear normal.  Nose: Nose normal.  Mouth/Throat: Oropharynx is clear and moist.  Eyes: EOM are normal.  Neck: Trachea normal, normal range of motion and full passive range of motion without pain. Neck supple. No JVD present. Carotid bruit is not present. No thyromegaly present.  Cardiovascular: Normal rate, regular rhythm, normal heart sounds and intact distal pulses.  Exam reveals no gallop and no friction rub.   No murmur heard. Pulmonary/Chest: Effort normal and breath sounds normal.  Abdominal: Soft. Bowel sounds are normal. She exhibits no distension and no mass. There is no tenderness.  Musculoskeletal: Normal range of motion.  Lymphadenopathy:    She has no cervical adenopathy.  Neurological: She is alert and oriented to person, place, and time. She has normal reflexes.  Skin: Skin is warm and dry.  Psychiatric: She has a normal mood and affect. Her behavior is normal. Judgment and thought content normal.    BP 127/65 mmHg  Pulse 55  Temp(Src) 97.9 F (36.6 C) (Oral)  Ht _0  (1.499 m)  Wt 123 lb (55.792 kg)  BMI 24.83 kg/m2  Results for orders placed or performed in visit on 12/28/15  POCT glycosylated hemoglobin (Hb A1C)  Result Value Ref Range   Hemoglobin A1C 7.5        Assessment & Plan:   1. Hyperlipidemia Low fat diet - Lipid panel - atorvastatin (LIPITOR) 10 MG tablet; Take 1 tablet (10 mg total) by mouth daily at 6 PM.  Dispense: 90 tablet; Refill: 1  2. Essential hypertension Do not add salt to diet - CMP14+EGFR - valsartan-hydrochlorothiazide (DIOVAN-HCT) 320-25 MG tablet; Take 1 tablet by mouth daily.  Dispense: 90 tablet; Refill:  1 - amLODipine (NORVASC) 10 MG tablet; Take 1 tablet (10 mg total) by mouth daily.  Dispense: 90 tablet; Refill: 1  3. Type 2 diabetes mellitus with diabetic polyneuropathy, unspecified long term insulin use status (HCC) Carb counting - POCT glycosylated hemoglobin (Hb A1C) - glipiZIDE (GLIPIZIDE XL) 10 MG 24 hr tablet; Take 1 tablet (10 mg total) by mouth daily with breakfast.  Dispense: 90 tablet; Refill: 1 - insulin glargine (LANTUS) 100 UNIT/ML injection; Inject 0.41 mLs (41 Units total) into the skin every morning.  Dispense: 30 mL; Refill: 5  4. Coronary artery disease involving native coronary artery of native heart without angina pectoris - dipyridamole-aspirin (AGGRENOX) 200-25 MG 12hr capsule; Take 1 capsule by mouth 2 (two) times daily.  Dispense: 2701 capsule; Refill: 1 - metoprolol succinate (TOPROL-XL) 100 MG 24 hr tablet; Take 1 tablet (100 mg total) by mouth daily. Take with or immediately following a meal.  Dispense: 90 tablet; Refill: 1  5. Transient cerebral ischemia, unspecified transient cerebral ischemia type  6. Gastroesophageal reflux disease without esophagitis Avoid spicy foods Do not eat 2 hours prior to bedtime - pantoprazole (PROTONIX) 40 MG tablet; Take 1 tablet (40 mg  total) by mouth daily.  Dispense: 90 tablet; Refill: 1   7. Depression Stress manaegment  8. GAD (generalized anxiety disorder) Stress management - LORazepam (ATIVAN) 0.5 MG tablet; TAKE  (1)  TABLET TWICE A DAY.  Dispense: 60 tablet; Refill: 1     Encouraged to get eye exam Labs pending Health maintenance reviewed Diet and exercise encouraged Continue all meds Follow up  In 3 months   [place], FNP

## 2015-12-29 ENCOUNTER — Other Ambulatory Visit: Payer: Self-pay | Admitting: Nurse Practitioner

## 2015-12-29 LAB — CMP14+EGFR
ALT: 9 IU/L (ref 0–32)
AST: 16 IU/L (ref 0–40)
Albumin/Globulin Ratio: 1.6 (ref 1.1–2.5)
Albumin: 4.2 g/dL (ref 3.5–4.7)
Alkaline Phosphatase: 61 IU/L (ref 39–117)
BUN/Creatinine Ratio: 13 (ref 11–26)
BUN: 15 mg/dL (ref 8–27)
Bilirubin Total: 0.4 mg/dL (ref 0.0–1.2)
CALCIUM: 9.7 mg/dL (ref 8.7–10.3)
CO2: 24 mmol/L (ref 18–29)
CREATININE: 1.12 mg/dL — AB (ref 0.57–1.00)
Chloride: 92 mmol/L — ABNORMAL LOW (ref 96–106)
GFR calc Af Amer: 54 mL/min/{1.73_m2} — ABNORMAL LOW (ref >59)
GFR, EST NON AFRICAN AMERICAN: 47 mL/min/{1.73_m2} — AB (ref >59)
Globulin, Total: 2.7 g/dL (ref 1.5–4.5)
Glucose: 124 mg/dL — ABNORMAL HIGH (ref 65–99)
Potassium: 4.1 mmol/L (ref 3.5–5.2)
Sodium: 133 mmol/L — ABNORMAL LOW (ref 134–144)
TOTAL PROTEIN: 6.9 g/dL (ref 6.0–8.5)

## 2015-12-29 LAB — LIPID PANEL
CHOL/HDL RATIO: 2.8 ratio (ref 0.0–4.4)
Cholesterol, Total: 96 mg/dL — ABNORMAL LOW (ref 100–199)
HDL: 34 mg/dL — AB (ref >39)
LDL CALC: 19 mg/dL (ref 0–99)
TRIGLYCERIDES: 217 mg/dL — AB (ref 0–149)
VLDL CHOLESTEROL CAL: 43 mg/dL — AB (ref 5–40)

## 2016-02-26 ENCOUNTER — Other Ambulatory Visit: Payer: Self-pay | Admitting: Nurse Practitioner

## 2016-02-26 NOTE — Telephone Encounter (Signed)
Please call in ativan with 1 refills

## 2016-02-26 NOTE — Telephone Encounter (Signed)
Last filled 01/25/16, last seen 12/28/15. Call in at [place]

## 2016-02-26 NOTE — Telephone Encounter (Signed)
Rx called in for Lorazepam to [facility] per MMM

## 2016-02-28 ENCOUNTER — Other Ambulatory Visit: Payer: Self-pay | Admitting: Nurse Practitioner

## 2016-03-05 DIAGNOSIS — E1142 Type 2 diabetes mellitus with diabetic polyneuropathy: Secondary | ICD-10-CM | POA: Diagnosis not present

## 2016-03-05 DIAGNOSIS — B351 Tinea unguium: Secondary | ICD-10-CM | POA: Diagnosis not present

## 2016-03-05 DIAGNOSIS — L84 Corns and callosities: Secondary | ICD-10-CM | POA: Diagnosis not present

## 2016-03-19 ENCOUNTER — Telehealth: Payer: Self-pay | Admitting: Nurse Practitioner

## 2016-03-27 ENCOUNTER — Ambulatory Visit (INDEPENDENT_AMBULATORY_CARE_PROVIDER_SITE_OTHER): Payer: Medicare Other | Admitting: Nurse Practitioner

## 2016-03-27 ENCOUNTER — Encounter: Payer: Self-pay | Admitting: Nurse Practitioner

## 2016-03-27 VITALS — BP 134/68 | HR 62 | Temp 97.1°F | Ht 59.0 in | Wt 123.0 lb

## 2016-03-27 DIAGNOSIS — N183 Chronic kidney disease, stage 3 (moderate): Secondary | ICD-10-CM | POA: Diagnosis not present

## 2016-03-27 DIAGNOSIS — I10 Essential (primary) hypertension: Secondary | ICD-10-CM | POA: Diagnosis not present

## 2016-03-27 DIAGNOSIS — F329 Major depressive disorder, single episode, unspecified: Secondary | ICD-10-CM

## 2016-03-27 DIAGNOSIS — E119 Type 2 diabetes mellitus without complications: Secondary | ICD-10-CM | POA: Diagnosis not present

## 2016-03-27 DIAGNOSIS — E1142 Type 2 diabetes mellitus with diabetic polyneuropathy: Secondary | ICD-10-CM

## 2016-03-27 DIAGNOSIS — I2510 Atherosclerotic heart disease of native coronary artery without angina pectoris: Secondary | ICD-10-CM | POA: Diagnosis not present

## 2016-03-27 DIAGNOSIS — K219 Gastro-esophageal reflux disease without esophagitis: Secondary | ICD-10-CM

## 2016-03-27 DIAGNOSIS — E785 Hyperlipidemia, unspecified: Secondary | ICD-10-CM | POA: Diagnosis not present

## 2016-03-27 DIAGNOSIS — I779 Disorder of arteries and arterioles, unspecified: Secondary | ICD-10-CM

## 2016-03-27 DIAGNOSIS — N1831 Chronic kidney disease, stage 3a: Secondary | ICD-10-CM

## 2016-03-27 DIAGNOSIS — F32A Depression, unspecified: Secondary | ICD-10-CM

## 2016-03-27 DIAGNOSIS — I739 Peripheral vascular disease, unspecified: Secondary | ICD-10-CM

## 2016-03-27 DIAGNOSIS — Z794 Long term (current) use of insulin: Secondary | ICD-10-CM

## 2016-03-27 DIAGNOSIS — F411 Generalized anxiety disorder: Secondary | ICD-10-CM

## 2016-03-27 LAB — BAYER DCA HB A1C WAIVED: HB A1C: 7.7 % — AB (ref <7.0)

## 2016-03-27 MED ORDER — METOPROLOL SUCCINATE ER 100 MG PO TB24: 100.0000 mg | ORAL_TABLET | Freq: Every day | ORAL | Status: DC

## 2016-03-27 MED ORDER — PANTOPRAZOLE SODIUM 40 MG PO TBEC: 40.0000 mg | DELAYED_RELEASE_TABLET | Freq: Every day | ORAL | Status: DC

## 2016-03-27 MED ORDER — LORAZEPAM 0.5 MG PO TABS: ORAL_TABLET | ORAL | Status: DC

## 2016-03-27 MED ORDER — INSULIN GLARGINE 100 UNIT/ML ~~LOC~~ SOLN: 41.0000 [IU] | Freq: Every morning | SUBCUTANEOUS | Status: DC

## 2016-03-27 MED ORDER — GLIPIZIDE ER 10 MG PO TB24: 10.0000 mg | ORAL_TABLET | Freq: Every day | ORAL | Status: DC

## 2016-03-27 MED ORDER — ASPIRIN-DIPYRIDAMOLE ER 25-200 MG PO CP12: 1.0000 | ORAL_CAPSULE | Freq: Two times a day (BID) | ORAL | Status: DC

## 2016-03-27 MED ORDER — VALSARTAN-HYDROCHLOROTHIAZIDE 320-25 MG PO TABS: 1.0000 | ORAL_TABLET | Freq: Every day | ORAL | Status: DC

## 2016-03-27 MED ORDER — ATORVASTATIN CALCIUM 10 MG PO TABS: 10.0000 mg | ORAL_TABLET | Freq: Every day | ORAL | Status: DC

## 2016-03-27 MED ORDER — AMLODIPINE BESYLATE 10 MG PO TABS: 10.0000 mg | ORAL_TABLET | Freq: Every day | ORAL | Status: DC

## 2016-03-27 NOTE — Patient Instructions (Signed)
Insulin Glargine injection What is this medicine? INSULIN GLARGINE (IN su lin GLAR geen) is a human-made form of insulin. This drug lowers the amount of sugar in your blood. It is a long-acting insulin that is usually given once a day. This medicine may be used for other purposes; ask your health care provider or pharmacist if you have questions. What should I tell my health care provider before I take this medicine? They need to know if you have any of these conditions: -episodes of hypoglycemia -kidney disease -liver disease -an unusual or allergic reaction to insulin, metacresol, other medicines, foods, dyes, or preservatives -pregnant or trying to get pregnant -breast-feeding How should I use this medicine? This medicine is for injection under the skin. Use this medicine at the same time each day. Use exactly as directed. This insulin should never be mixed in the same syringe with other insulins before injection. Do not vigorously shake before use. You will be taught how to use this medicine and how to adjust doses for activities and illness. Do not use more insulin than prescribed. Always check the appearance of your insulin before using it. This medicine should be clear and colorless like water. Do not use it if it is cloudy, thickened, colored, or has solid particles in it. It is important that you put your used needles and syringes in a special sharps container. Do not put them in a trash can. If you do not have a sharps container, call your pharmacist or healthcare provider to get one. Talk to your pediatrician regarding the use of this medicine in children. Special care may be needed. Overdosage: If you think you have taken too much of this medicine contact a poison control center or emergency room at once. NOTE: This medicine is only for you. Do not share this medicine with others. What if I miss a dose? It is important not to miss a dose. Your health care professional or doctor should  discuss a plan for missed doses with you. If you do miss a dose, follow their plan. Do not take double doses. What may interact with this medicine? -other medicines for diabetes Many medications may cause an increase or decrease in blood sugar, these include: -alcohol containing beverages -aspirin and aspirin-like drugs -chloramphenicol -chromium -diuretics -female hormones, like estrogens or progestins and birth control pills -heart medicines -isoniazid -female hormones or anabolic steroids -medicines for weight loss -medicines for allergies, asthma, cold, or cough -medicines for mental problems -medicines called MAO Inhibitors like Nardil, Parnate, Marplan, Eldepryl -niacin -NSAIDs, medicines for pain and inflammation, like ibuprofen or naproxen -pentamidine -phenytoin -probenecid -quinolone antibiotics like ciprofloxacin, levofloxacin, ofloxacin -some herbal dietary supplements -steroid medicines like prednisone or cortisone -thyroid medicine Some medications can hide the warning symptoms of low blood sugar. You may need to monitor your blood sugar more closely if you are taking one of these medications. These include: -beta-blockers such as atenolol, metoprolol, propranolol -clonidine -guanethidine -reserpine This list may not describe all possible interactions. Give your health care provider a list of all the medicines, herbs, non-prescription drugs, or dietary supplements you use. Also tell them if you smoke, drink alcohol, or use illegal drugs. Some items may interact with your medicine. What should I watch for while using this medicine? Visit your health care professional or doctor for regular checks on your progress. Do not drive, use machinery, or do anything that needs mental alertness until you know how this medicine affects you. Alcohol may interfere with the effect  of this medicine. Avoid alcoholic drinks. A test called the HbA1C (A1C) will be monitored. This is a  simple blood test. It measures your blood sugar control over the last 2 to 3 months. You will receive this test every 3 to 6 months. Learn how to check your blood sugar. Learn the symptoms of low and high blood sugar and how to manage them. Always carry a quick-source of sugar with you in case you have symptoms of low blood sugar. Examples include hard sugar candy or glucose tablets. Make sure others know that you can choke if you eat or drink when you develop serious symptoms of low blood sugar, such as seizures or unconsciousness. They must get medical help at once. Tell your doctor or health care professional if you have high blood sugar. You might need to change the dose of your medicine. If you are sick or exercising more than usual, you might need to change the dose of your medicine. Do not skip meals. Ask your doctor or health care professional if you should avoid alcohol. Many nonprescription cough and cold products contain sugar or alcohol. These can affect blood sugar. Make sure that you have the right kind of syringe for the type of insulin you use. Try not to change the brand and type of insulin or syringe unless your health care professional or doctor tells you to. Switching insulin brand or type can cause dangerously high or low blood sugar. Always keep an extra supply of insulin, syringes, and needles on hand. Use a syringe one time only. Throw away syringe and needle in a closed container to prevent accidental needle sticks. Insulin pens and cartridges should never be shared. Even if the needle is changed, sharing may result in passing of viruses like hepatitis or HIV. Wear a medical ID bracelet or chain, and carry a card that describes your disease and details of your medicine and dosage times. What side effects may I notice from receiving this medicine? Side effects that you should report to your health care professional or doctor as soon as possible: -allergic reactions like skin rash,  itching or hives, swelling of the face, lips, or tongue -breathing problems -signs and symptoms of high blood sugar such as dizziness, dry mouth, dry skin, fruity breath, nausea, stomach pain, increased hunger or thirst, increased urination -signs and symptoms of low blood sugar such as feeling anxious, confusion, dizziness, increased hunger, unusually weak or tired, sweating, shakiness, cold, irritable, headache, blurred vision, fast heartbeat, loss of consciousness Side effects that usually do not require medical attention (report to your health care professional or doctor if they continue or are bothersome): -increase or decrease in fatty tissue under the skin due to overuse of a particular injection site -itching, burning, swelling, or rash at site where injected This list may not describe all possible side effects. Call your doctor for medical advice about side effects. You may report side effects to FDA at 1-800-FDA-1088. Where should I keep my medicine? Keep out of the reach of children. Store unopened vials in a refrigerator between 2 and 8 degrees C (36 and 46 degrees F). Do not freeze or use if the insulin has been frozen. Opened vials (vials currently in use) may be stored in the refrigerator or at room temperature, at approximately 25 degrees C (77 degrees F) or cooler. Keeping your insulin at room temperature decreases the amount of pain during injection. Once opened, your insulin can be used for 28 days. After 28 days,  the vial should be thrown away. Store Lantus [employer], mining in a refrigerator between 2 and 8 degrees C (36 and 46 degrees F) or at room temperature below 30 degrees C (86 degrees F). Do not freeze or use if the insulin has been frozen. Once opened, the pens should be kept at room temperature. Do not store in the refrigerator once opened. Once opened, the insulin can be used for 28 days. After 28 days, the Lantus Solostar Pen or Basaglar KwikPen should be  thrown away. Store [employer] in a refrigerator between 2 and 8 degrees C (36 and 46 degrees F). Do not freeze or use if the insulin has been frozen. Once opened, the pens should be kept at room temperature below 30 degrees C (86 degrees F). Do not store in the refrigerator once opened. Once opened, the insulin can be used for 42 days. After 42 days, the [employer] Pen should be thrown away. Protect all insulin vials and pens from light and excessive heat. Throw away any unused medicine after the expiration date or after the specified time for room temperature storage has passed. NOTE: This sheet is a summary. It may not cover all possible information. If you have questions about this medicine, talk to your doctor, pharmacist, or health care provider.    2016, [employer]. (2014-10-31 10:12:42)

## 2016-03-27 NOTE — Progress Notes (Signed)
Subjective:    Patient ID: Kara Dean, female    DOB: 08/07/1933, 82 y.o.   MRN: [id number]  Patient here today for follow up of chronic medical problems.  Outpatient Encounter Prescriptions as of 03/27/2016  Medication Sig  . amLODipine (NORVASC) 10 MG tablet Take 1 tablet (10 mg total) by mouth daily.  Marland Kitchen atorvastatin (LIPITOR) 10 MG tablet Take 1 tablet (10 mg total) by mouth daily at 6 PM.  . B Complex Vitamins (B COMPLEX-B12) TABS Take 1 tablet by mouth daily.    Marland Kitchen dipyridamole-aspirin (AGGRENOX) 200-25 MG 12hr capsule Take 1 capsule by mouth 2 (two) times daily.  Marland Kitchen glipiZIDE (GLIPIZIDE XL) 10 MG 24 hr tablet Take 1 tablet (10 mg total) by mouth daily with breakfast.  . insulin glargine (LANTUS) 100 UNIT/ML injection Inject 0.41 mLs (41 Units total) into the skin every morning.  . Insulin Pen Needle (DROPLET PEN NEEDLES) 31G X 6 MM MISC 1 each by Does not apply route daily.  Marland Kitchen LANTUS SOLOSTAR 100 UNIT/ML Solostar Pen Inject 0.41 mLs (41 Units total) into the skin every morning.  Marland Kitchen LORazepam (ATIVAN) 0.5 MG tablet TAKE  (1)  TABLET TWICE A DAY.  . Magnesium 250 MG TABS Take 1 tablet by mouth daily.    . metoprolol succinate (TOPROL-XL) 100 MG 24 hr tablet Take 1 tablet (100 mg total) by mouth daily. Take with or immediately following a meal.  . omega-3 fish oil (MAXEPA) 1000 MG CAPS capsule Take 1 capsule by mouth daily.    . ONE TOUCH ULTRA TEST test strip CHECK BLOOD SUGAR 3 TIMES A DAY OR AS DIRECTED  . pantoprazole (PROTONIX) 40 MG tablet Take 1 tablet (40 mg total) by mouth daily.  . valsartan-hydrochlorothiazide (DIOVAN-HCT) 320-25 MG tablet Take 1 tablet by mouth daily.  . vitamin D, CHOLECALCIFEROL, 400 UNITS tablet Take 400 Units by mouth daily.     No facility-administered encounter medications on file as of 03/27/2016.      Hypertension This is a chronic problem. The current episode started more than 1 year ago. Associated symptoms include palpitations (Intermittent -  goes away with rest). Pertinent negatives include no chest pain, headaches or shortness of breath. Risk factors for coronary artery disease include diabetes mellitus, dyslipidemia and post-menopausal state. Past treatments include ACE inhibitors. The current treatment provides moderate improvement. Compliance problems include diet and exercise.  Hypertensive end-organ damage includes CVA. There is no history of CAD/MI.  Hyperlipidemia This is a chronic problem. The current episode started more than 1 year ago. Pertinent negatives include no chest pain, myalgias or shortness of breath. Risk factors for coronary artery disease include hypertension, diabetes mellitus and post-menopausal.  Diabetes She presents for her follow-up diabetic visit. She has type 2 diabetes mellitus. Pertinent negatives for hypoglycemia include no headaches. Pertinent negatives for diabetes include no chest pain, no polydipsia, no polyphagia, no polyuria and no weakness. Diabetic complications include a CVA. Risk factors for coronary artery disease include post-menopausal and diabetes mellitus. Her breakfast blood glucose is taken between 8-9 am. Her breakfast blood glucose range is generally 90-110 mg/dl. Her dinner blood glucose is taken between 5-6 pm. Her overall blood glucose range is >200 mg/dl.  GAD/depression Takes lorzepam as needed. Has not had a recent in a few weeks.  GERD No symptoms as long as taking Protonix.  Hx TIA/ carotid artery disease Takes aggrenox- No bleeding CAD Currently on aggrenox and metoprolol daily- sees cardiologist every 6  onths and prn- no  recent chest pain. CKD/stage 3 Watching creatine for now   Review of Systems  Constitutional: Negative.   HENT: Negative.   Respiratory: Negative for shortness of breath.   Cardiovascular: Positive for palpitations (Intermittent - goes away with rest). Negative for chest pain.  Endocrine: Negative for polydipsia, polyphagia and polyuria.   Musculoskeletal: Negative for myalgias.  Neurological: Negative for weakness and headaches.  Psychiatric/Behavioral: Negative.   All other systems reviewed and are negative.      Objective:   Physical Exam  Constitutional: She is oriented to person, place, and time. She appears well-developed and well-nourished.  HENT:  Right Ear: External ear normal.  Left Ear: External ear normal.  Nose: Nose normal.  Mouth/Throat: Oropharynx is clear and moist.  Eyes: EOM are normal.  Neck: Trachea normal, normal range of motion and full passive range of motion without pain. Neck supple. No JVD present. Carotid bruit is not present. No thyromegaly present.  Cardiovascular: Normal rate, regular rhythm, normal heart sounds and intact distal pulses.  Exam reveals no gallop and no friction rub.   No murmur heard. Pulmonary/Chest: Effort normal and breath sounds normal.  Abdominal: Soft. Bowel sounds are normal. She exhibits no distension and no mass. There is no tenderness.  Musculoskeletal: Normal range of motion.  Lymphadenopathy:    She has no cervical adenopathy.  Neurological: She is alert and oriented to person, place, and time. She has normal reflexes.  Skin: Skin is warm and dry.  Psychiatric: She has a normal mood and affect. Her behavior is normal. Judgment and thought content normal.   BP 134/68 mmHg  Pulse 62  Temp(Src) 97.1 F (36.2 C) (Oral)  Ht 4' 11" (1.499 m)  Wt 123 lb (55.792 kg)  BMI 24.83 kg/m2   hgba1c 7.7%- up from 7.5% at last visit      Assessment & Plan:   1. Essential hypertension Do not add slat to diet - valsartan-hydrochlorothiazide (DIOVAN-HCT) 320-25 MG tablet; Take 1 tablet by mouth daily.  Dispense: 90 tablet; Refill: 1 - amLODipine (NORVASC) 10 MG tablet; Take 1 tablet (10 mg total) by mouth daily.  Dispense: 90 tablet; Refill: 1 - CMP14+EGFR  2. Coronary artery disease involving native coronary artery of native heart without angina pectoris -  dipyridamole-aspirin (AGGRENOX) 200-25 MG 12hr capsule; Take 1 capsule by mouth 2 (two) times daily.  Dispense: 2701 capsule; Refill: 1 - metoprolol succinate (TOPROL-XL) 100 MG 24 hr tablet; Take 1 tablet (100 mg total) by mouth daily. Take with or immediately following a meal.  Dispense: 90 tablet; Refill: 1  3. Carotid artery disease without cerebral infarction ([place])  4. Gastroesophageal reflux disease without esophagitis Avoid spicy foods Do not eat 2 hours prior to bedtime - pantoprazole (PROTONIX) 40 MG tablet; Take 1 tablet (40 mg total) by mouth daily.  Dispense: 90 tablet; Refill: 1  5. Type 2 diabetes mellitus treated with insulin ([place]) Stricter carb counting Stop boost and do glucerna Has not been taking lantus everyday- because sometimes her bllod sugar is low in morning so she will not take her shot. MUST do lantus daily. - Bayer DCA Hb A1c Waived - glipiZIDE (GLIPIZIDE XL) 10 MG 24 hr tablet; Take 1 tablet (10 mg total) by mouth daily with breakfast.  Dispense: 90 tablet; Refill: 1 - insulin glargine (LANTUS) 100 UNIT/ML injection; Inject 0.41 mLs (41 Units total) into the skin every morning.  Dispense: 30 mL; Refill: 5   6. CKD stage G3a/A3, GFR 45 - 59 and  albumin creatinine ratio >300 mg/g Watch labs  7. Hyperlipidemia Low fat diet - atorvastatin (LIPITOR) 10 MG tablet; Take 1 tablet (10 mg total) by mouth daily at 6 PM.  Dispense: 90 tablet; Refill: 1 - Lipid panel  8. Depression Stress management  9. GAD (generalized anxiety disorder)  - LORazepam (ATIVAN) 0.5 MG tablet; TAKE  (1)  TABLET TWICE A DAY.  Dispense: 60 tablet; Refill: 1    Labs pending Health maintenance reviewed Diet and exercise encouraged Continue all meds Follow up  In 3 months   [place], FNP

## 2016-03-28 LAB — CMP14+EGFR
ALK PHOS: 66 IU/L (ref 39–117)
ALT: 9 IU/L (ref 0–32)
AST: 15 IU/L (ref 0–40)
Albumin/Globulin Ratio: 1.6 (ref 1.2–2.2)
Albumin: 4.4 g/dL (ref 3.5–4.7)
BUN/Creatinine Ratio: 12 (ref 12–28)
BUN: 13 mg/dL (ref 8–27)
Bilirubin Total: 0.6 mg/dL (ref 0.0–1.2)
CO2: 25 mmol/L (ref 18–29)
CREATININE: 1.11 mg/dL — AB (ref 0.57–1.00)
Calcium: 10.1 mg/dL (ref 8.7–10.3)
Chloride: 94 mmol/L — ABNORMAL LOW (ref 96–106)
GFR calc Af Amer: 54 mL/min/{1.73_m2} — ABNORMAL LOW (ref >59)
GFR calc non Af Amer: 47 mL/min/{1.73_m2} — ABNORMAL LOW (ref >59)
GLOBULIN, TOTAL: 2.8 g/dL (ref 1.5–4.5)
GLUCOSE: 95 mg/dL (ref 65–99)
Potassium: 3.8 mmol/L (ref 3.5–5.2)
SODIUM: 137 mmol/L (ref 134–144)
Total Protein: 7.2 g/dL (ref 6.0–8.5)

## 2016-03-28 LAB — LIPID PANEL
CHOL/HDL RATIO: 2.6 ratio (ref 0.0–4.4)
Cholesterol, Total: 111 mg/dL (ref 100–199)
HDL: 43 mg/dL (ref >39)
LDL Calculated: 40 mg/dL (ref 0–99)
TRIGLYCERIDES: 142 mg/dL (ref 0–149)
VLDL Cholesterol Cal: 28 mg/dL (ref 5–40)

## 2016-03-29 ENCOUNTER — Telehealth: Payer: Self-pay | Admitting: Nurse Practitioner

## 2016-04-04 ENCOUNTER — Ambulatory Visit: Payer: Medicare Other | Admitting: Physician Assistant

## 2016-04-05 ENCOUNTER — Ambulatory Visit (INDEPENDENT_AMBULATORY_CARE_PROVIDER_SITE_OTHER): Payer: Medicare Other | Admitting: Nurse Practitioner

## 2016-04-05 ENCOUNTER — Ambulatory Visit (INDEPENDENT_AMBULATORY_CARE_PROVIDER_SITE_OTHER): Payer: Medicare Other

## 2016-04-05 ENCOUNTER — Encounter: Payer: Self-pay | Admitting: Nurse Practitioner

## 2016-04-05 VITALS — BP 135/63 | HR 46 | Temp 100.3°F | Ht 59.0 in | Wt 122.0 lb

## 2016-04-05 DIAGNOSIS — R059 Cough, unspecified: Secondary | ICD-10-CM

## 2016-04-05 DIAGNOSIS — R05 Cough: Secondary | ICD-10-CM | POA: Diagnosis not present

## 2016-04-05 DIAGNOSIS — J189 Pneumonia, unspecified organism: Secondary | ICD-10-CM | POA: Diagnosis not present

## 2016-04-05 MED ORDER — AZITHROMYCIN 250 MG PO TABS: ORAL_TABLET | ORAL | Status: DC

## 2016-04-05 MED ORDER — BENZONATATE 100 MG PO CAPS: 100.0000 mg | ORAL_CAPSULE | Freq: Three times a day (TID) | ORAL | Status: DC | PRN

## 2016-04-05 NOTE — Patient Instructions (Signed)

## 2016-04-05 NOTE — Progress Notes (Signed)
  Subjective:    Patient ID: Kara Dean, female    DOB: 1933/06/27, 82 y.o.   MRN: [id number]   HPI   Patient brought in by daughter. Daughter says that she has been in the bed all week with cough and congestion- does not feel like doing anything- has been running fever of 101 intermittently for a week. Has been using tylenol for fever.  Review of Systems  Constitutional: Positive for fever, chills and appetite change (decreased).  HENT: Positive for congestion. Negative for ear pain, sore throat and trouble swallowing.   Respiratory: Positive for cough.   Psychiatric/Behavioral: Negative.   All other systems reviewed and are negative.      Objective:   Physical Exam  Constitutional: She is oriented to person, place, and time. She appears well-developed and well-nourished.  HENT:  Right Ear: External ear normal.  Left Ear: External ear normal.  Nose: Nose normal.  Mouth/Throat: Oropharynx is clear and moist.  Eyes: EOM are normal.  Neck: Trachea normal, normal range of motion and full passive range of motion without pain. Neck supple. No JVD present. Carotid bruit is not present. No thyromegaly present.  Cardiovascular: Normal rate, regular rhythm, normal heart sounds and intact distal pulses.  Exam reveals no gallop and no friction rub.   No murmur heard. Pulmonary/Chest: Effort normal. No respiratory distress. She has no wheezes.  Diminished breath sounds lower lobes bil  Abdominal: Soft. Bowel sounds are normal. She exhibits no distension and no mass. There is no tenderness.  Musculoskeletal: Normal range of motion.  Lymphadenopathy:    She has no cervical adenopathy.  Neurological: She is alert and oriented to person, place, and time. She has normal reflexes.  Skin: Skin is warm and dry.  Psychiatric: She has a normal mood and affect. Her behavior is normal. Judgment and thought content normal.   BP 135/63 mmHg  Pulse 46  Temp(Src) 100.3 F (37.9 C) (Oral)  Ht 4' 11"   (1.499 m)  Wt 122 lb (55.339 kg)  BMI 24.63 kg/m2   Chest xray- right middle lobe pneumonia-Preliminary reading by Ronnald Collum, FNP  [facility]      Assessment & Plan:   1. Pneumonia due to organism   2. Cough    Meds ordered this encounter  Medications  . azithromycin (ZITHROMAX Z-PAK) 250 MG tablet    Sig: As directed    Dispense:  1 each    Refill:  0    Order Specific Question:  Supervising Provider    Answer:  Chipper Herb [[id number]]  . benzonatate (TESSALON PERLES) 100 MG capsule    Sig: Take 1 capsule (100 mg total) by mouth 3 (three) times daily as needed for cough.    Dispense:  20 capsule    Refill:  0    Order Specific Question:  Supervising Provider    Answer:  Chipper Herb [[id number]]  repeat chest xray in 6 weeks Motrin or tylenol OTC for fever Rest RTO prn  [name], FNP

## 2016-04-06 ENCOUNTER — Emergency Department (HOSPITAL_COMMUNITY)
Admission: EM | Admit: 2016-04-06 | Discharge: 2016-04-06 | Disposition: A | Discharge status: Home / Self Care | Payer: Medicare Other | Attending: Emergency Medicine | Admitting: Emergency Medicine

## 2016-04-06 ENCOUNTER — Emergency Department (HOSPITAL_COMMUNITY): Payer: Medicare Other

## 2016-04-06 ENCOUNTER — Encounter (HOSPITAL_COMMUNITY): Payer: Self-pay | Admitting: *Deleted

## 2016-04-06 DIAGNOSIS — R42 Dizziness and giddiness: Secondary | ICD-10-CM | POA: Diagnosis not present

## 2016-04-06 DIAGNOSIS — J189 Pneumonia, unspecified organism: Secondary | ICD-10-CM

## 2016-04-06 DIAGNOSIS — Z91040 Latex allergy status: Secondary | ICD-10-CM | POA: Insufficient documentation

## 2016-04-06 DIAGNOSIS — E785 Hyperlipidemia, unspecified: Secondary | ICD-10-CM | POA: Diagnosis not present

## 2016-04-06 DIAGNOSIS — R197 Diarrhea, unspecified: Secondary | ICD-10-CM | POA: Diagnosis not present

## 2016-04-06 DIAGNOSIS — F419 Anxiety disorder, unspecified: Secondary | ICD-10-CM | POA: Diagnosis not present

## 2016-04-06 DIAGNOSIS — I10 Essential (primary) hypertension: Secondary | ICD-10-CM | POA: Diagnosis not present

## 2016-04-06 DIAGNOSIS — E119 Type 2 diabetes mellitus without complications: Secondary | ICD-10-CM | POA: Diagnosis not present

## 2016-04-06 DIAGNOSIS — R4020 Unspecified coma: Secondary | ICD-10-CM

## 2016-04-06 DIAGNOSIS — Z79899 Other long term (current) drug therapy: Secondary | ICD-10-CM | POA: Diagnosis not present

## 2016-04-06 DIAGNOSIS — R55 Syncope and collapse: Secondary | ICD-10-CM | POA: Insufficient documentation

## 2016-04-06 DIAGNOSIS — Z794 Long term (current) use of insulin: Secondary | ICD-10-CM | POA: Insufficient documentation

## 2016-04-06 DIAGNOSIS — F329 Major depressive disorder, single episode, unspecified: Secondary | ICD-10-CM | POA: Insufficient documentation

## 2016-04-06 DIAGNOSIS — J159 Unspecified bacterial pneumonia: Secondary | ICD-10-CM | POA: Insufficient documentation

## 2016-04-06 DIAGNOSIS — Z8673 Personal history of transient ischemic attack (TIA), and cerebral infarction without residual deficits: Secondary | ICD-10-CM | POA: Insufficient documentation

## 2016-04-06 DIAGNOSIS — M1990 Unspecified osteoarthritis, unspecified site: Secondary | ICD-10-CM | POA: Insufficient documentation

## 2016-04-06 DIAGNOSIS — K219 Gastro-esophageal reflux disease without esophagitis: Secondary | ICD-10-CM | POA: Insufficient documentation

## 2016-04-06 DIAGNOSIS — H269 Unspecified cataract: Secondary | ICD-10-CM | POA: Diagnosis not present

## 2016-04-06 DIAGNOSIS — Z7984 Long term (current) use of oral hypoglycemic drugs: Secondary | ICD-10-CM | POA: Insufficient documentation

## 2016-04-06 DIAGNOSIS — I959 Hypotension, unspecified: Secondary | ICD-10-CM | POA: Diagnosis not present

## 2016-04-06 LAB — BASIC METABOLIC PANEL
Anion gap: 10 (ref 5–15)
BUN: 14 mg/dL (ref 6–20)
CHLORIDE: 94 mmol/L — AB (ref 101–111)
CO2: 25 mmol/L (ref 22–32)
CREATININE: 1.22 mg/dL — AB (ref 0.44–1.00)
Calcium: 9.4 mg/dL (ref 8.9–10.3)
GFR calc Af Amer: 47 mL/min — ABNORMAL LOW (ref >60)
GFR calc non Af Amer: 41 mL/min — ABNORMAL LOW (ref >60)
Glucose, Bld: 223 mg/dL — ABNORMAL HIGH (ref 65–99)
Potassium: 3.8 mmol/L (ref 3.5–5.1)
Sodium: 129 mmol/L — ABNORMAL LOW (ref 135–145)

## 2016-04-06 LAB — CBC WITH DIFFERENTIAL/PLATELET
BASOS PCT: 0 %
Basophils Absolute: 0 10*3/uL (ref 0.0–0.1)
Eosinophils Absolute: 0.3 10*3/uL (ref 0.0–0.7)
Eosinophils Relative: 2 %
HEMATOCRIT: 34.9 % — AB (ref 36.0–46.0)
HEMOGLOBIN: 11.8 g/dL — AB (ref 12.0–15.0)
LYMPHS PCT: 5 %
Lymphs Abs: 0.7 10*3/uL (ref 0.7–4.0)
MCH: 29.4 pg (ref 26.0–34.0)
MCHC: 33.8 g/dL (ref 30.0–36.0)
MCV: 87 fL (ref 78.0–100.0)
MONOS PCT: 10 %
Monocytes Absolute: 1.3 10*3/uL — ABNORMAL HIGH (ref 0.1–1.0)
NEUTROS ABS: 10.7 10*3/uL — AB (ref 1.7–7.7)
Neutrophils Relative %: 83 %
Platelets: 225 10*3/uL (ref 150–400)
RBC: 4.01 MIL/uL (ref 3.87–5.11)
RDW: 12.3 % (ref 11.5–15.5)
WBC: 13 10*3/uL — ABNORMAL HIGH (ref 4.0–10.5)

## 2016-04-06 LAB — CBG MONITORING, ED: Glucose-Capillary: 218 mg/dL — ABNORMAL HIGH (ref 65–99)

## 2016-04-06 MED ORDER — LEVOFLOXACIN 500 MG PO TABS: 500.0000 mg | ORAL_TABLET | Freq: Every day | ORAL | Status: DC

## 2016-04-06 MED ORDER — LEVOFLOXACIN 750 MG PO TABS: 750.0000 mg | ORAL_TABLET | Freq: Every day | ORAL | Status: DC

## 2016-04-06 MED ORDER — SODIUM CHLORIDE 0.9 % IV BOLUS (SEPSIS)
500.0000 mL | Freq: Once | INTRAVENOUS | Status: AC
  Administered 2016-04-06: 500 mL via INTRAVENOUS

## 2016-04-06 NOTE — ED Provider Notes (Signed)
CSN: [id number]     Arrival date & time 04/06/16  1332 History   First MD Initiated Contact with Patient 04/06/16 1341     Chief Complaint  Patient presents with  . Loss of Consciousness     (Consider location/radiation/quality/duration/timing/severity/associated sxs/prior Treatment) Patient is a 82 y.o. female presenting with syncope. The history is provided by the patient.  Loss of Consciousness Episode history:  Multiple Most recent episode:  Today Duration:  2 seconds Timing:  Constant Progression:  Resolved Chronicity:  New Witnessed: no   Relieved by:  Nothing Worsened by:  Nothing tried Ineffective treatments:  None tried Associated symptoms: no chest pain, no dizziness, no fever, no headaches, no nausea, no palpitations, no shortness of breath and no vomiting     82 yo F With a chief complaint of syncope. Patient states she was standing in line at [facility] when she felt terrible and passed out. Has been having cough congestion fevers for the past couple days. Was seen by their family physician and diagnosed with pneumonia and started on azithromycin. Patient denied eating anything this morning her to going out of the house. Feeling mildly better since she passed out. Denies current chest pain or shortness of breath headache.  Past Medical History  Diagnosis Date  . Hypertension   . History of TIAs   . Dyslipidemia   . CVA (cerebral infarction)   . Diabetes mellitus     type 2  . Depression   . Osteoarthritis   . Carotid bruit   . Hyponatremia   . Hyperlipidemia   . Anxiety   . Cataract   . GERD (gastroesophageal reflux disease)   . Crohn's colitis [facility])    Past Surgical History  Procedure Laterality Date  . Back surgery  1994  . Vesicovaginal fistula closure w/ tah  1984  . Tubal ligation    . Abdominal hysterectomy    . Colon resection  1990's   Family History  Problem Relation Age of Onset  . Coronary artery disease Sister     stent x 3  . Seizures  Sister   . Coronary artery disease Brother     stent  . Hypertension Mother   . Stroke Mother   . Emphysema Father   . Alcohol abuse Brother   . Lung disease Brother    Social History  Substance Use Topics  . Smoking status: Never Smoker   . Smokeless tobacco: Never Used  . Alcohol Use: No   OB History    No data available     Review of Systems  Constitutional: Negative for fever and chills.  HENT: Negative for congestion and rhinorrhea.   Eyes: Negative for redness and visual disturbance.  Respiratory: Negative for shortness of breath and wheezing.   Cardiovascular: Positive for syncope. Negative for chest pain and palpitations.  Gastrointestinal: Negative for nausea and vomiting.  Genitourinary: Negative for dysuria and urgency.  Musculoskeletal: Negative for myalgias and arthralgias.  Skin: Negative for pallor and wound.  Neurological: Negative for dizziness and headaches.      Allergies  Codeine; Latex; Morphine; Niacin; Sulfa antibiotics; and Sulfonamide derivatives  Home Medications   Prior to Admission medications   Medication Sig Start Date End Date Taking? Authorizing Provider  amLODipine (NORVASC) 10 MG tablet Take 1 tablet (10 mg total) by mouth daily. 03/27/16   [name], FNP  atorvastatin (LIPITOR) 10 MG tablet Take 1 tablet (10 mg total) by mouth daily at 6 PM. 03/27/16  [name], FNP  B Complex Vitamins (B COMPLEX-B12) TABS Take 1 tablet by mouth daily.      Historical Provider, MD  benzonatate (TESSALON PERLES) 100 MG capsule Take 1 capsule (100 mg total) by mouth 3 (three) times daily as needed for cough. 04/05/16   [name], FNP  dipyridamole-aspirin (AGGRENOX) 200-25 MG 12hr capsule Take 1 capsule by mouth 2 (two) times daily. 03/27/16   [name], FNP  glipiZIDE (GLIPIZIDE XL) 10 MG 24 hr tablet Take 1 tablet (10 mg total) by mouth daily with breakfast. 03/27/16   [name], FNP  insulin glargine  (LANTUS) 100 UNIT/ML injection Inject 0.41 mLs (41 Units total) into the skin every morning. 03/27/16   [name], FNP  Insulin Pen Needle (DROPLET PEN NEEDLES) 31G X 6 MM MISC 1 each by Does not apply route daily. 06/23/15   [name], FNP  levofloxacin (LEVAQUIN) 750 MG tablet Take 1 tablet (750 mg total) by mouth daily. 04/06/16   Deno Etienne, DO  LORazepam (ATIVAN) 0.5 MG tablet TAKE  (1)  TABLET TWICE A DAY. 03/27/16   [name], FNP  Magnesium 250 MG TABS Take 1 tablet by mouth daily.      Historical Provider, MD  metoprolol succinate (TOPROL-XL) 100 MG 24 hr tablet Take 1 tablet (100 mg total) by mouth daily. Take with or immediately following a meal. 03/27/16   [name], FNP  omega-3 fish oil (MAXEPA) 1000 MG CAPS capsule Take 1 capsule by mouth daily.      Historical Provider, MD  ONE TOUCH ULTRA TEST test strip CHECK BLOOD SUGAR 3 TIMES A DAY OR AS DIRECTED 12/29/15   [name], FNP  pantoprazole (PROTONIX) 40 MG tablet Take 1 tablet (40 mg total) by mouth daily. 03/27/16   [name], FNP  valsartan-hydrochlorothiazide (DIOVAN-HCT) 320-25 MG tablet Take 1 tablet by mouth daily. 03/27/16   [name], FNP  vitamin D, CHOLECALCIFEROL, 400 UNITS tablet Take 400 Units by mouth daily.      Historical Provider, MD   BP 128/60 mmHg  Pulse 68  Temp(Src) 98.5 F (36.9 C) (Oral)  Resp 19  SpO2 97% Physical Exam  Constitutional: She is oriented to person, place, and time. She appears well-developed and well-nourished. No distress.  HENT:  Head: Normocephalic and atraumatic.  Eyes: EOM are normal. Pupils are equal, round, and reactive to light.  Neck: Normal range of motion. Neck supple.  Cardiovascular: Normal rate and regular rhythm.  Exam reveals no gallop and no friction rub.   No murmur heard. Pulmonary/Chest: Effort normal. She has no wheezes. She has no rales.  Abdominal: Soft. She exhibits no distension. There is no  tenderness. There is no rebound.  Musculoskeletal: She exhibits no edema or tenderness.  Neurological: She is alert and oriented to person, place, and time.  Skin: Skin is warm and dry. She is not diaphoretic.  Psychiatric: She has a normal mood and affect. Her behavior is normal.  Nursing note and vitals reviewed.   ED Course  Procedures (including critical care time) Labs Review Labs Reviewed  CBC WITH DIFFERENTIAL/PLATELET - Abnormal; Notable for the following:    WBC 13.0 (*)    Hemoglobin 11.8 (*)    HCT 34.9 (*)    Neutro Abs 10.7 (*)    Monocytes Absolute 1.3 (*)    All other components within normal limits  BASIC METABOLIC PANEL - Abnormal; Notable for the following:    Sodium 129 (*)    Chloride  94 (*)    Glucose, Bld 223 (*)    Creatinine, Ser 1.22 (*)    GFR calc non Af Amer 41 (*)    GFR calc Af Amer 47 (*)    All other components within normal limits  CBG MONITORING, ED - Abnormal; Notable for the following:    Glucose-Capillary 218 (*)    All other components within normal limits    Imaging Review Dg Chest 2 View  04/06/2016  CLINICAL DATA:  Nausea and dizziness. Syncopal episode today a [employer]. EXAM: CHEST  2 VIEW COMPARISON:  04/05/2016 FINDINGS: Mild cardiac enlargement. There is no pleural effusion or edema identified. Chronic interstitial coarsening is identified. There is superimposed airspace consolidation involving the right middle lobe. This is similar to the previous exam. Aortic atherosclerosis noted. IMPRESSION: 1. No change in right middle lobe pneumonia. Followup PA and lateral chest X-ray is recommended in 3-4 weeks following trial of antibiotic therapy to ensure resolution and exclude underlying malignancy. 2. Aortic atherosclerosis. Electronically Signed   By: Kerby Moors M.D.   On: 04/06/2016 15:48   Dg Chest 2 View  04/05/2016  CLINICAL DATA:  Cough EXAM: CHEST  2 VIEW COMPARISON:  None. FINDINGS: Right middle lobe infiltrate consistent with  pneumonia. No pleural effusion COPD with pulmonary hyperinflation and prominent lung markings bilaterally. Heart size upper normal.  Negative for heart failure or edema. IMPRESSION: COPD with right middle lobe pneumonia. Electronically Signed   By: Franchot Gallo M.D.   On: 04/05/2016 11:01   I have personally reviewed and evaluated these images and lab results as part of my medical decision-making.   EKG Interpretation   Date/Time:  Saturday April 06 2016 13:51:52 EDT Ventricular Rate:  72 PR Interval:  175 QRS Duration: 84 QT Interval:  406 QTC Calculation: 444 R Axis:   -53 Text Interpretation:  Sinus rhythm Left anterior fascicular block Left  ventricular hypertrophy No significant change since last tracing Confirmed  by Lama Narayanan MD, Quillian Quince ([id number]) on 04/06/2016 2:14:04 PM      MDM   Final diagnoses:  LOC (loss of consciousness)  CAP (community acquired pneumonia)    82 yo F with a CC of syncope.  Feel likely vasovagal.  Workup with persistent pna.  Will change to levaquin.  PCP follow up.  4:16 PM:  I have discussed the diagnosis/risks/treatment options with the patient and family and believe the pt to be eligible for discharge home to follow-up with PCP. We also discussed returning to the ED immediately if new or worsening sx occur. We discussed the sx which are most concerning (e.g., sudden worsening pain, fever, inability to tolerate by mouth) that necessitate immediate return. Medications administered to the patient during their visit and any new prescriptions provided to the patient are listed below.  Medications given during this visit Medications  sodium chloride 0.9 % bolus 500 mL (not administered)    New Prescriptions   LEVOFLOXACIN (LEVAQUIN) 750 MG TABLET    Take 1 tablet (750 mg total) by mouth daily.    The patient appears reasonably screen and/or stabilized for discharge and I doubt any other medical condition or other [facility] requiring further screening, evaluation,  or treatment in the ED at this time prior to discharge.       Deno Etienne, DO 04/06/16 1616

## 2016-04-06 NOTE — Discharge Instructions (Signed)

## 2016-04-06 NOTE — ED Notes (Signed)
Declined W/C at D/C and was escorted to lobby by RN. 

## 2016-04-06 NOTE — ED Notes (Signed)
Pt had a syncope episode 11/2 hr ago while at [employer].  Pt was also incont . Of stool.. Recent DX of PNA from PCP . HX DM and HTN. EMS cbg 270.

## 2016-04-15 ENCOUNTER — Ambulatory Visit (INDEPENDENT_AMBULATORY_CARE_PROVIDER_SITE_OTHER): Payer: Medicare Other | Admitting: Cardiovascular Disease

## 2016-04-15 ENCOUNTER — Encounter: Payer: Self-pay | Admitting: Cardiovascular Disease

## 2016-04-15 VITALS — BP 136/56 | HR 60 | Ht 59.0 in | Wt 121.4 lb

## 2016-04-15 DIAGNOSIS — I6523 Occlusion and stenosis of bilateral carotid arteries: Secondary | ICD-10-CM | POA: Diagnosis not present

## 2016-04-15 DIAGNOSIS — I10 Essential (primary) hypertension: Secondary | ICD-10-CM | POA: Diagnosis not present

## 2016-04-15 NOTE — Patient Instructions (Addendum)
Medication Instructions:  Your physician recommends that you continue on your current medications as directed. Please refer to the Current Medication list given to you today.  Labwork: No new orders.   Testing/Procedures: Your physician has requested that you have a carotid duplex in 6 MONTHS. This test is an ultrasound of the carotid arteries in your neck. It looks at blood flow through these arteries that supply the brain with blood. Allow one hour for this exam. There are no restrictions or special instructions.  Follow-Up: Your physician wants you to follow-up in: 6 MONTHS with Dr Burt Knack.  You will receive a reminder letter in the mail two months in advance. If you don't receive a letter, please call our office to schedule the follow-up appointment.   Any Other Special Instructions Will Be Listed Below (If Applicable).     If you need a refill on your cardiac medications before your next appointment, please call your pharmacy.

## 2016-04-15 NOTE — Progress Notes (Signed)
Cardiology Office Note Date:  04/15/2016   ID:  Kara Dean, DOB [birth year], MRN [id number]  PCP:  [name], FNP  Cardiologist:  Sherren Mocha, MD    Chief Complaint  Patient presents with  . Coronary Artery Disease  . Hyperlipidemia    C/O shortness of breath. Denies cp,lee,or claudication  . essential hypertension    History of Present Illness: Kara Dean is a 82 y.o. female who presents for follow-up evaluation. The patient has a history of TIA, hypertension, type 2 diabetes, and dyslipidemia. She was last seen In April 2016. The patient had a nuclear stress test in 2014 to evaluate chest pain. This showed normal LVEF and normal perfusion.  She had a recent episode of syncope. She was standing in line at [facility] and began to feel lightheaded and dizzy. She was helped down to the ground by her granddaughter. She also vomited during the episode. Notes she had been diagnosed with pneumonia and had been running a fever for a week prior to this.   She denies orthopnea, PND, or leg swelling. No chest pain or pressure.   Past Medical History  Diagnosis Date  . Hypertension   . History of TIAs   . Dyslipidemia   . CVA (cerebral infarction)   . Diabetes mellitus     type 2  . Depression   . Osteoarthritis   . Carotid bruit   . Hyponatremia   . Hyperlipidemia   . Anxiety   . Cataract   . GERD (gastroesophageal reflux disease)   . Crohn's colitis [facility])     Past Surgical History  Procedure Laterality Date  . Back surgery  1994  . Vesicovaginal fistula closure w/ tah  1984  . Tubal ligation    . Abdominal hysterectomy    . Colon resection  1990's    Current Outpatient Prescriptions  Medication Sig Dispense Refill  . amLODipine (NORVASC) 10 MG tablet Take 1 tablet (10 mg total) by mouth daily. 90 tablet 1  . atorvastatin (LIPITOR) 10 MG tablet Take 1 tablet (10 mg total) by mouth daily at 6 PM. 90 tablet 1  . B Complex Vitamins (B COMPLEX-B12) TABS  Take 1 tablet by mouth daily.      . benzonatate (TESSALON PERLES) 100 MG capsule Take 1 capsule (100 mg total) by mouth 3 (three) times daily as needed for cough. 20 capsule 0  . dipyridamole-aspirin (AGGRENOX) 200-25 MG 12hr capsule Take 1 capsule by mouth 2 (two) times daily. 2701 capsule 1  . glipiZIDE (GLIPIZIDE XL) 10 MG 24 hr tablet Take 1 tablet (10 mg total) by mouth daily with breakfast. 90 tablet 1  . insulin glargine (LANTUS) 100 UNIT/ML injection Inject 0.41 mLs (41 Units total) into the skin every morning. 30 mL 5  . Insulin Pen Needle (DROPLET PEN NEEDLES) 31G X 6 MM MISC 1 each by Does not apply route daily. 100 each 5  . levofloxacin (LEVAQUIN) 750 MG tablet Take 1 tablet (750 mg total) by mouth daily. 7 tablet 0  . LORazepam (ATIVAN) 0.5 MG tablet Take 0.5 mg by mouth 2 (two) times daily.    . Magnesium 250 MG TABS Take 1 tablet by mouth daily.      . metoprolol succinate (TOPROL-XL) 100 MG 24 hr tablet Take 1 tablet (100 mg total) by mouth daily. Take with or immediately following a meal. 90 tablet 1  . omega-3 fish oil (MAXEPA) 1000 MG CAPS capsule Take 1 capsule by mouth  daily.      . ONE TOUCH ULTRA TEST test strip CHECK BLOOD SUGAR 3 TIMES A DAY OR AS DIRECTED 100 each 2  . pantoprazole (PROTONIX) 40 MG tablet Take 1 tablet (40 mg total) by mouth daily. 90 tablet 1  . valsartan-hydrochlorothiazide (DIOVAN-HCT) 320-25 MG tablet Take 1 tablet by mouth daily. 90 tablet 1  . vitamin D, CHOLECALCIFEROL, 400 UNITS tablet Take 400 Units by mouth daily.       No current facility-administered medications for this visit.    Allergies:   Codeine; Latex; Morphine; Niacin; Sulfa antibiotics; and Sulfonamide derivatives   Social History:  The patient  reports that she has never smoked. She has never used smokeless tobacco. She reports that she does not drink alcohol or use illicit drugs.   Family History:  The patient's  family history includes Alcohol abuse in her brother; Coronary  artery disease in her brother and sister; Emphysema in her father; Hypertension in her mother; Lung disease in her brother; Seizures in her sister; Stroke in her mother.    ROS:  Please see the history of present illness.  Otherwise, review of systems is positive for decreased appetite, chills, hearing loss, cough, exertional dyspnea, depression, muscle pain, dizziness, fever, anxiety, balance problems.  All other systems are reviewed and negative.    PHYSICAL EXAM: VS:  BP 136/56 mmHg  Pulse 60  Ht 4' 11"  (1.499 m)  Wt 121 lb 6.4 oz (55.067 kg)  BMI 24.51 kg/m2 , BMI Body mass index is 24.51 kg/(m^2). GEN: Well nourished, well developed, pleasant elderly woman in no acute distress HEENT: normal Neck: no JVD, no masses. No carotid bruits Cardiac: RRR without murmur or gallop                Respiratory:  clear to auscultation bilaterally, normal work of breathing GI: soft, nontender, nondistended, + BS MS: no deformity or atrophy Ext: no pretibial edema, pedal pulses 2+= bilaterally Skin: warm and dry, no rash Neuro:  Strength and sensation are intact Psych: euthymic mood, full affect  EKG:  EKG is ordered today. The ekg ordered today shows Sinus bradycardia 57 bpm, voltage criteria for LVH, possible septal infarct age-undetermined  Recent Labs: 07/26/2015: Magnesium 2.2; TSH 1.540 03/27/2016: ALT 9 04/06/2016: BUN 14; Creatinine, Ser 1.22*; Hemoglobin 11.8*; Platelets 225; Potassium 3.8; Sodium 129*   Lipid Panel     Component Value Date/Time   CHOL 111 03/27/2016 0956   CHOL 108 03/09/2015 1036   CHOL 124 02/26/2013 1043   TRIG 142 03/27/2016 0956   TRIG 132 03/09/2015 1036   TRIG 182* 02/26/2013 1043   HDL 43 03/27/2016 0956   HDL 39* 03/09/2015 1036   HDL 40 02/26/2013 1043   HDL 37* 11/10/2010 0327   CHOLHDL 2.6 03/27/2016 0956   CHOLHDL 3.0 11/10/2010 0327   VLDL 25 11/10/2010 0327   LDLCALC 40 03/27/2016 0956   LDLCALC 42 04/27/2014 1455   LDLCALC 48 02/26/2013  1043   LDLCALC  11/10/2010 0327    48        Total Cholesterol/HDL:CHD Risk Coronary Heart Disease Risk Table                     Men   Women  1/2 Average Risk   3.4   3.3  Average Risk       5.0   4.4  2 X Average Risk   9.6   7.1  3 X Average Risk  23.4   11.0        Use the calculated Patient Ratio above and the CHD Risk Table to determine the patient's CHD Risk.        ATP III CLASSIFICATION (LDL):  <100     mg/dL   Optimal  100-129  mg/dL   Near or Above                    Optimal  130-159  mg/dL   Borderline  160-189  mg/dL   High  >190     mg/dL   Very High      Wt Readings from Last 3 Encounters:  04/15/16 121 lb 6.4 oz (55.067 kg)  04/05/16 122 lb (55.339 kg)  03/27/16 123 lb (55.792 kg)     Cardiac Studies Reviewed: CXR 04-06-2016: IMPRESSION: 1. No change in right middle lobe pneumonia. Followup PA and lateral chest X-ray is recommended in 3-4 weeks following trial of antibiotic therapy to ensure resolution and exclude underlying malignancy. 2. Aortic atherosclerosis.  ASSESSMENT AND PLAN: 1.  Essential HTN: Blood pressure is controlled on her current medicines. I suspect her recent episode of syncope was related to her acute illness with pneumonia. We discussed the importance of staying well hydrated.  2. Hyperlipidemia: Treated with atorvastatin. Followed by her PCP.  3. Carotid stenosis and no history of stroke: Moderate carotid stenosis by serial duplex scans. Continue with therapy. The patient is treated with aspirin and statin drug. Reviewed previous studies demonstrating moderate carotid disease. Her last study was in 2014. This needs to be updated.  4. Syncope: suspect related to pneumonia, decreased PO intake, fever. No recurrence - if recurrent lightheadedness/syncope advised to seek immediate medical attention.   Current medicines are reviewed with the patient today.  The patient does not have concerns regarding medicines.  Labs/ tests ordered  today include:  No orders of the defined types were placed in this encounter.   Disposition:   FU 6 months with a carotid ultrasound prior to that visit  Signed, Sherren Mocha, MD  04/15/2016 3:13 PM    [address], [place], [place]  [address] Phone: [phone]; Fax: [phone]

## 2016-04-26 ENCOUNTER — Other Ambulatory Visit: Payer: Self-pay | Admitting: Nurse Practitioner

## 2016-04-26 NOTE — Telephone Encounter (Signed)
Last seen 04/05/16  MMM  If approved route to nurse to call into [facility]

## 2016-04-29 NOTE — Telephone Encounter (Signed)
Rx called in 

## 2016-04-29 NOTE — Telephone Encounter (Signed)
Pt aware.

## 2016-05-21 ENCOUNTER — Ambulatory Visit (HOSPITAL_COMMUNITY)
Admission: RE | Admit: 2016-05-21 | Discharge: 2016-05-21 | Disposition: A | Discharge status: Home / Self Care | Payer: Medicare Other | Source: Ambulatory Visit | Attending: Cardiology | Admitting: Cardiology

## 2016-05-21 DIAGNOSIS — I6523 Occlusion and stenosis of bilateral carotid arteries: Secondary | ICD-10-CM | POA: Diagnosis not present

## 2016-05-21 DIAGNOSIS — F329 Major depressive disorder, single episode, unspecified: Secondary | ICD-10-CM | POA: Insufficient documentation

## 2016-05-21 DIAGNOSIS — E785 Hyperlipidemia, unspecified: Secondary | ICD-10-CM | POA: Insufficient documentation

## 2016-05-21 DIAGNOSIS — K219 Gastro-esophageal reflux disease without esophagitis: Secondary | ICD-10-CM | POA: Insufficient documentation

## 2016-05-21 DIAGNOSIS — E119 Type 2 diabetes mellitus without complications: Secondary | ICD-10-CM | POA: Diagnosis not present

## 2016-05-21 DIAGNOSIS — F419 Anxiety disorder, unspecified: Secondary | ICD-10-CM | POA: Diagnosis not present

## 2016-05-21 DIAGNOSIS — I10 Essential (primary) hypertension: Secondary | ICD-10-CM | POA: Diagnosis not present

## 2016-06-06 DIAGNOSIS — E1142 Type 2 diabetes mellitus with diabetic polyneuropathy: Secondary | ICD-10-CM | POA: Diagnosis not present

## 2016-06-06 DIAGNOSIS — B351 Tinea unguium: Secondary | ICD-10-CM | POA: Diagnosis not present

## 2016-06-06 DIAGNOSIS — L84 Corns and callosities: Secondary | ICD-10-CM | POA: Diagnosis not present

## 2016-06-25 ENCOUNTER — Other Ambulatory Visit: Payer: Self-pay | Admitting: Nurse Practitioner

## 2016-06-25 DIAGNOSIS — E1142 Type 2 diabetes mellitus with diabetic polyneuropathy: Secondary | ICD-10-CM

## 2016-06-26 ENCOUNTER — Other Ambulatory Visit: Payer: Self-pay | Admitting: Nurse Practitioner

## 2016-06-26 NOTE — Telephone Encounter (Signed)
Last filled 05/29/16, last seen 04/04/16. Call in

## 2016-06-27 ENCOUNTER — Ambulatory Visit: Payer: Medicare Other | Admitting: Nurse Practitioner

## 2016-06-27 NOTE — Telephone Encounter (Signed)
Refill called to [facility]

## 2016-06-27 NOTE — Telephone Encounter (Signed)
Please call in ativan with 1 refills

## 2016-07-12 ENCOUNTER — Encounter: Payer: Self-pay | Admitting: Nurse Practitioner

## 2016-07-12 ENCOUNTER — Ambulatory Visit (INDEPENDENT_AMBULATORY_CARE_PROVIDER_SITE_OTHER): Payer: Medicare Other | Admitting: Nurse Practitioner

## 2016-07-12 VITALS — BP 140/62 | HR 54 | Temp 97.6°F | Ht 59.0 in | Wt 122.0 lb

## 2016-07-12 DIAGNOSIS — F32A Depression, unspecified: Secondary | ICD-10-CM

## 2016-07-12 DIAGNOSIS — K219 Gastro-esophageal reflux disease without esophagitis: Secondary | ICD-10-CM

## 2016-07-12 DIAGNOSIS — I739 Peripheral vascular disease, unspecified: Secondary | ICD-10-CM

## 2016-07-12 DIAGNOSIS — E785 Hyperlipidemia, unspecified: Secondary | ICD-10-CM

## 2016-07-12 DIAGNOSIS — I10 Essential (primary) hypertension: Secondary | ICD-10-CM

## 2016-07-12 DIAGNOSIS — I779 Disorder of arteries and arterioles, unspecified: Secondary | ICD-10-CM

## 2016-07-12 DIAGNOSIS — N183 Chronic kidney disease, stage 3 (moderate): Secondary | ICD-10-CM | POA: Diagnosis not present

## 2016-07-12 DIAGNOSIS — F411 Generalized anxiety disorder: Secondary | ICD-10-CM | POA: Diagnosis not present

## 2016-07-12 DIAGNOSIS — N1831 Chronic kidney disease, stage 3a: Secondary | ICD-10-CM

## 2016-07-12 DIAGNOSIS — E119 Type 2 diabetes mellitus without complications: Secondary | ICD-10-CM | POA: Diagnosis not present

## 2016-07-12 DIAGNOSIS — F329 Major depressive disorder, single episode, unspecified: Secondary | ICD-10-CM

## 2016-07-12 DIAGNOSIS — E1142 Type 2 diabetes mellitus with diabetic polyneuropathy: Secondary | ICD-10-CM

## 2016-07-12 DIAGNOSIS — Z794 Long term (current) use of insulin: Secondary | ICD-10-CM

## 2016-07-12 LAB — BAYER DCA HB A1C WAIVED: HB A1C: 6.8 % (ref <7.0)

## 2016-07-12 MED ORDER — LORAZEPAM 0.5 MG PO TABS: 0.5000 mg | ORAL_TABLET | Freq: Two times a day (BID) | ORAL | 2 refills | Status: DC

## 2016-07-12 NOTE — Progress Notes (Signed)
Subjective:    Patient ID: Kara Dean, female    DOB: 12/22/1932, 82 y.o.   MRN: [id number]  Patient here today for follow up of chronic medical problems. no changes since last visit. No complaints today.  Outpatient Encounter Prescriptions as of 07/12/2016  Medication Sig  . amLODipine (NORVASC) 10 MG tablet Take 1 tablet (10 mg total) by mouth daily.  Marland Kitchen atorvastatin (LIPITOR) 10 MG tablet Take 1 tablet (10 mg total) by mouth daily at 6 PM.  . B Complex Vitamins (B COMPLEX-B12) TABS Take 1 tablet by mouth daily.    . benzonatate (TESSALON PERLES) 100 MG capsule Take 1 capsule (100 mg total) by mouth 3 (three) times daily as needed for cough.  . dipyridamole-aspirin (AGGRENOX) 200-25 MG 12hr capsule Take 1 capsule by mouth 2 (two) times daily.  Marland Kitchen glipiZIDE (GLIPIZIDE XL) 10 MG 24 hr tablet Take 1 tablet (10 mg total) by mouth daily with breakfast.  . insulin glargine (LANTUS) 100 UNIT/ML injection Inject 0.41 mLs (41 Units total) into the skin every morning.  . Insulin Pen Needle (PEN NEEDLES) 31G X 6 MM MISC USE ONE DAILY AS DIRECTED  . levofloxacin (LEVAQUIN) 750 MG tablet Take 1 tablet (750 mg total) by mouth daily.  Marland Kitchen LORazepam (ATIVAN) 0.5 MG tablet TAKE  (1)  TABLET TWICE A DAY.  . Magnesium 250 MG TABS Take 1 tablet by mouth daily.    . metoprolol succinate (TOPROL-XL) 100 MG 24 hr tablet Take 1 tablet (100 mg total) by mouth daily. Take with or immediately following a meal.  . omega-3 fish oil (MAXEPA) 1000 MG CAPS capsule Take 1 capsule by mouth daily.    . ONE TOUCH ULTRA TEST test strip CHECK BLOOD SUGAR 3 TIMES A DAY OR AS DIRECTED  . pantoprazole (PROTONIX) 40 MG tablet Take 1 tablet (40 mg total) by mouth daily.  . valsartan-hydrochlorothiazide (DIOVAN-HCT) 320-25 MG tablet Take 1 tablet by mouth daily.  . vitamin D, CHOLECALCIFEROL, 400 UNITS tablet Take 400 Units by mouth daily.     No facility-administered encounter medications on file as of 07/12/2016.     Hypertension   This is a chronic problem. The current episode started more than 1 year ago. Associated symptoms include palpitations (Intermittent - goes away with rest). Pertinent negatives include no chest pain, headaches or shortness of breath. Risk factors for coronary artery disease include diabetes mellitus, dyslipidemia and post-menopausal state. Past treatments include ACE inhibitors. The current treatment provides moderate improvement. Compliance problems include diet and exercise.  Hypertensive end-organ damage includes CVA. There is no history of CAD/MI.  Hyperlipidemia  This is a chronic problem. The current episode started more than 1 year ago. Pertinent negatives include no chest pain, myalgias or shortness of breath. Risk factors for coronary artery disease include hypertension, diabetes mellitus and post-menopausal.  Diabetes  She presents for her follow-up diabetic visit. She has type 2 diabetes mellitus. Pertinent negatives for hypoglycemia include no headaches. Pertinent negatives for diabetes include no chest pain, no polydipsia, no polyphagia, no polyuria and no weakness. Diabetic complications include a CVA. Risk factors for coronary artery disease include post-menopausal and diabetes mellitus. Her breakfast blood glucose is taken between 8-9 am. Her breakfast blood glucose range is generally 90-110 mg/dl. Her dinner blood glucose is taken between 5-6 pm. Her overall blood glucose range is >200 mg/dl.  GAD/depression Takes lorzepam as needed. Has not had a recent in a few weeks.  GERD No symptoms as long as taking  Protonix.  Hx TIA/ carotid artery disease Takes aggrenox- No bleeding CAD Currently on aggrenox and metoprolol daily- sees cardiologist every 6  onths and prn- no recent chest pain. CKD/stage 3 Watching creatine for now   Review of Systems  Constitutional: Negative.   HENT: Negative.   Respiratory: Negative for shortness of breath.   Cardiovascular: Positive for palpitations  (Intermittent - goes away with rest). Negative for chest pain.  Endocrine: Negative for polydipsia, polyphagia and polyuria.  Musculoskeletal: Negative for myalgias.  Neurological: Negative for weakness and headaches.  Psychiatric/Behavioral: Negative.   All other systems reviewed and are negative.      Objective:   Physical Exam  Constitutional: She is oriented to person, place, and time. She appears well-developed and well-nourished.  HENT:  Right Ear: External ear normal.  Left Ear: External ear normal.  Nose: Nose normal.  Mouth/Throat: Oropharynx is clear and moist.  Eyes: EOM are normal.  Neck: Trachea normal, normal range of motion and full passive range of motion without pain. Neck supple. No JVD present. Carotid bruit is not present. No thyromegaly present.  Cardiovascular: Normal rate, regular rhythm, normal heart sounds and intact distal pulses.  Exam reveals no gallop and no friction rub.   No murmur heard. Pulmonary/Chest: Effort normal and breath sounds normal.  Abdominal: Soft. Bowel sounds are normal. She exhibits no distension and no mass. There is no tenderness.  Musculoskeletal: Normal range of motion.  Lymphadenopathy:    She has no cervical adenopathy.  Neurological: She is alert and oriented to person, place, and time. She has normal reflexes.  Skin: Skin is warm and dry.  Psychiatric: She has a normal mood and affect. Her behavior is normal. Judgment and thought content normal.    BP 140/62   Pulse (!) 54   Temp 97.6 F (36.4 C) (Oral)   Ht _0  (1.499 m)   Wt 122 lb (55.3 kg)   BMI 24.64 kg/m   hgba1c 6.8% down from 7.7% at last visit    Assessment & Plan:  1. Essential hypertension Do not add salt to diet - CMP14+EGFR - CMP14+EGFR  2. Type 2 diabetes mellitus with diabetic polyneuropathy, unspecified long term insulin use status (HCC) continue to watch carbs in diet - Bayer DCA Hb A1c Waived  3. Hyperlipidemia Low fat diet - Lipid  panel  4. Carotid artery disease without cerebral infarction ([place])  5. Gastroesophageal reflux disease without esophagitis Avoid spicy foods Do not eat 2 hours prior to bedtime  6. Type 2 diabetes mellitus treated with insulin ([place])   7. CKD stage G3a/A3, GFR 45 - 59 and albumin creatinine ratio >300 mg/g Will continue to watch labs  8. Depression Stress management  9. GAD (generalized anxiety disorder) - LORazepam (ATIVAN) 0.5 MG tablet; Take 1 tablet (0.5 mg total) by mouth 2 (two) times daily.  Dispense: 60 tablet; Refill: 2    Labs pending Health maintenance reviewed Diet and exercise encouraged Continue all meds Follow up  In 3 months   [place], FNP

## 2016-07-12 NOTE — Patient Instructions (Signed)
Fall Prevention in the Home  Falls can cause injuries and can affect people from all age groups. There are many simple things that you can do to make your home safe and to help prevent falls. WHAT CAN I DO ON THE OUTSIDE OF MY HOME?  Regularly repair the edges of walkways and driveways and fix any cracks.  Remove high doorway thresholds.  Trim any shrubbery on the main path into your home.  Use bright outdoor lighting.  Clear walkways of debris and clutter, including tools and rocks.  Regularly check that handrails are securely fastened and in good repair. Both sides of any steps should have handrails.  Install guardrails along the edges of any raised decks or porches.  Have leaves, snow, and ice cleared regularly.  Use sand or salt on walkways during winter months.  In the garage, clean up any spills right away, including grease or oil spills. WHAT CAN I DO IN THE BATHROOM?  Use night lights.  Install grab bars by the toilet and in the tub and shower. Do not use towel bars as grab bars.  Use non-skid mats or decals on the floor of the tub or shower.  If you need to sit down while you are in the shower, use a plastic, non-slip stool..  Keep the floor dry. Immediately clean up any water that spills on the floor.  Remove soap buildup in the tub or shower on a regular basis.  Attach bath mats securely with double-sided non-slip rug tape.  Remove throw rugs and other tripping hazards from the floor. WHAT CAN I DO IN THE BEDROOM?  Use night lights.  Make sure that a bedside light is easy to reach.  Do not use oversized bedding that drapes onto the floor.  Have a firm chair that has side arms to use for getting dressed.  Remove throw rugs and other tripping hazards from the floor. WHAT CAN I DO IN THE KITCHEN?   Clean up any spills right away.  Avoid walking on wet floors.  Place frequently used items in easy-to-reach places.  If you need to reach for something  above you, use a sturdy step stool that has a grab bar.  Keep electrical cables out of the way.  Do not use floor polish or wax that makes floors slippery. If you have to use wax, make sure that it is non-skid floor wax.  Remove throw rugs and other tripping hazards from the floor. WHAT CAN I DO IN THE STAIRWAYS?  Do not leave any items on the stairs.  Make sure that there are handrails on both sides of the stairs. Fix handrails that are broken or loose. Make sure that handrails are as long as the stairways.  Check any carpeting to make sure that it is firmly attached to the stairs. Fix any carpet that is loose or worn.  Avoid having throw rugs at the top or bottom of stairways, or secure the rugs with carpet tape to prevent them from moving.  Make sure that you have a light switch at the top of the stairs and the bottom of the stairs. If you do not have them, have them installed. WHAT ARE SOME OTHER FALL PREVENTION TIPS?  Wear closed-toe shoes that fit well and support your feet. Wear shoes that have rubber soles or low heels.  When you use a stepladder, make sure that it is completely opened and that the sides are firmly locked. Have someone hold the ladder while you   are using it. Do not climb a closed stepladder.  Add color or contrast paint or tape to grab bars and handrails in your home. Place contrasting color strips on the first and last steps.  Use mobility aids as needed, such as canes, walkers, scooters, and crutches.  Turn on lights if it is dark. Replace any light bulbs that burn out.  Set up furniture so that there are clear paths. Keep the furniture in the same spot.  Fix any uneven floor surfaces.  Choose a carpet design that does not hide the edge of steps of a stairway.  Be aware of any and all pets.  Review your medicines with your healthcare provider. Some medicines can cause dizziness or changes in blood pressure, which increase your risk of falling. Talk  with your health care provider about other ways that you can decrease your risk of falls. This may include working with a physical therapist or trainer to improve your strength, balance, and endurance.   This information is not intended to replace advice given to you by your health care provider. Make sure you discuss any questions you have with your health care provider.   Document Released: 10/11/2002 Document Revised: 03/07/2015 Document Reviewed: 11/25/2014 Elsevier Interactive Patient Education 2016 [employer]  

## 2016-07-13 LAB — CMP14+EGFR
A/G RATIO: 1.6 (ref 1.2–2.2)
ALT: 10 IU/L (ref 0–32)
AST: 17 IU/L (ref 0–40)
Albumin: 4.5 g/dL (ref 3.5–4.7)
Alkaline Phosphatase: 60 IU/L (ref 39–117)
BILIRUBIN TOTAL: 0.7 mg/dL (ref 0.0–1.2)
BUN/Creatinine Ratio: 15 (ref 12–28)
BUN: 17 mg/dL (ref 8–27)
CALCIUM: 10.3 mg/dL (ref 8.7–10.3)
CHLORIDE: 92 mmol/L — AB (ref 96–106)
CO2: 25 mmol/L (ref 18–29)
Creatinine, Ser: 1.1 mg/dL — ABNORMAL HIGH (ref 0.57–1.00)
GFR, EST AFRICAN AMERICAN: 55 mL/min/{1.73_m2} — AB (ref >59)
GFR, EST NON AFRICAN AMERICAN: 48 mL/min/{1.73_m2} — AB (ref >59)
GLUCOSE: 74 mg/dL (ref 65–99)
Globulin, Total: 2.8 g/dL (ref 1.5–4.5)
POTASSIUM: 4 mmol/L (ref 3.5–5.2)
Sodium: 133 mmol/L — ABNORMAL LOW (ref 134–144)
TOTAL PROTEIN: 7.3 g/dL (ref 6.0–8.5)

## 2016-07-13 LAB — LIPID PANEL
Chol/HDL Ratio: 2.5 ratio units (ref 0.0–4.4)
Cholesterol, Total: 116 mg/dL (ref 100–199)
HDL: 46 mg/dL (ref >39)
LDL Calculated: 44 mg/dL (ref 0–99)
TRIGLYCERIDES: 130 mg/dL (ref 0–149)
VLDL CHOLESTEROL CAL: 26 mg/dL (ref 5–40)

## 2016-09-05 DIAGNOSIS — L84 Corns and callosities: Secondary | ICD-10-CM | POA: Diagnosis not present

## 2016-09-05 DIAGNOSIS — E1142 Type 2 diabetes mellitus with diabetic polyneuropathy: Secondary | ICD-10-CM | POA: Diagnosis not present

## 2016-09-05 DIAGNOSIS — B351 Tinea unguium: Secondary | ICD-10-CM | POA: Diagnosis not present

## 2016-09-23 ENCOUNTER — Other Ambulatory Visit: Payer: Self-pay | Admitting: Nurse Practitioner

## 2016-10-01 ENCOUNTER — Other Ambulatory Visit: Payer: Self-pay | Admitting: Nurse Practitioner

## 2016-10-01 DIAGNOSIS — E1142 Type 2 diabetes mellitus with diabetic polyneuropathy: Secondary | ICD-10-CM

## 2016-10-14 ENCOUNTER — Encounter: Payer: Self-pay | Admitting: Nurse Practitioner

## 2016-10-14 ENCOUNTER — Ambulatory Visit (INDEPENDENT_AMBULATORY_CARE_PROVIDER_SITE_OTHER): Payer: Medicare Other | Admitting: Nurse Practitioner

## 2016-10-14 ENCOUNTER — Ambulatory Visit (INDEPENDENT_AMBULATORY_CARE_PROVIDER_SITE_OTHER): Payer: Medicare Other

## 2016-10-14 VITALS — BP 137/66 | HR 65 | Temp 97.2°F | Ht 59.0 in | Wt 124.0 lb

## 2016-10-14 DIAGNOSIS — E1142 Type 2 diabetes mellitus with diabetic polyneuropathy: Secondary | ICD-10-CM | POA: Diagnosis not present

## 2016-10-14 DIAGNOSIS — E782 Mixed hyperlipidemia: Secondary | ICD-10-CM

## 2016-10-14 DIAGNOSIS — Z794 Long term (current) use of insulin: Secondary | ICD-10-CM

## 2016-10-14 DIAGNOSIS — R062 Wheezing: Secondary | ICD-10-CM

## 2016-10-14 DIAGNOSIS — N183 Chronic kidney disease, stage 3 (moderate): Secondary | ICD-10-CM | POA: Diagnosis not present

## 2016-10-14 DIAGNOSIS — F3342 Major depressive disorder, recurrent, in full remission: Secondary | ICD-10-CM

## 2016-10-14 DIAGNOSIS — K219 Gastro-esophageal reflux disease without esophagitis: Secondary | ICD-10-CM

## 2016-10-14 DIAGNOSIS — E119 Type 2 diabetes mellitus without complications: Secondary | ICD-10-CM

## 2016-10-14 DIAGNOSIS — I10 Essential (primary) hypertension: Secondary | ICD-10-CM | POA: Diagnosis not present

## 2016-10-14 DIAGNOSIS — I779 Disorder of arteries and arterioles, unspecified: Secondary | ICD-10-CM

## 2016-10-14 DIAGNOSIS — I2510 Atherosclerotic heart disease of native coronary artery without angina pectoris: Secondary | ICD-10-CM

## 2016-10-14 DIAGNOSIS — F411 Generalized anxiety disorder: Secondary | ICD-10-CM

## 2016-10-14 DIAGNOSIS — E785 Hyperlipidemia, unspecified: Secondary | ICD-10-CM | POA: Diagnosis not present

## 2016-10-14 DIAGNOSIS — I739 Peripheral vascular disease, unspecified: Secondary | ICD-10-CM

## 2016-10-14 DIAGNOSIS — N1831 Chronic kidney disease, stage 3a: Secondary | ICD-10-CM

## 2016-10-14 LAB — BAYER DCA HB A1C WAIVED: HB A1C: 6.7 % (ref <7.0)

## 2016-10-14 MED ORDER — AMLODIPINE BESYLATE 10 MG PO TABS: 10.0000 mg | ORAL_TABLET | Freq: Every day | ORAL | 1 refills | Status: DC

## 2016-10-14 MED ORDER — INSULIN GLARGINE 100 UNIT/ML ~~LOC~~ SOLN: 38.0000 [IU] | Freq: Every morning | SUBCUTANEOUS | 5 refills | Status: DC

## 2016-10-14 MED ORDER — INSULIN GLARGINE 100 UNIT/ML ~~LOC~~ SOLN: 41.0000 [IU] | Freq: Every morning | SUBCUTANEOUS | 5 refills | Status: DC

## 2016-10-14 MED ORDER — GLIPIZIDE ER 10 MG PO TB24: 10.0000 mg | ORAL_TABLET | Freq: Every day | ORAL | 1 refills | Status: DC

## 2016-10-14 MED ORDER — LORAZEPAM 0.5 MG PO TABS: 0.5000 mg | ORAL_TABLET | Freq: Two times a day (BID) | ORAL | 2 refills | Status: DC

## 2016-10-14 MED ORDER — METOPROLOL SUCCINATE ER 100 MG PO TB24: 100.0000 mg | ORAL_TABLET | Freq: Every day | ORAL | 1 refills | Status: DC

## 2016-10-14 MED ORDER — VALSARTAN-HYDROCHLOROTHIAZIDE 320-25 MG PO TABS: 1.0000 | ORAL_TABLET | Freq: Every day | ORAL | 1 refills | Status: DC

## 2016-10-14 MED ORDER — ATORVASTATIN CALCIUM 10 MG PO TABS: 10.0000 mg | ORAL_TABLET | Freq: Every day | ORAL | 1 refills | Status: DC

## 2016-10-14 MED ORDER — FLUTICASONE PROPIONATE 50 MCG/ACT NA SUSP: 2.0000 | Freq: Every day | NASAL | 6 refills | Status: DC

## 2016-10-14 MED ORDER — ASPIRIN-DIPYRIDAMOLE ER 25-200 MG PO CP12: 1.0000 | ORAL_CAPSULE | Freq: Two times a day (BID) | ORAL | 1 refills | Status: DC

## 2016-10-14 MED ORDER — PANTOPRAZOLE SODIUM 40 MG PO TBEC: 40.0000 mg | DELAYED_RELEASE_TABLET | Freq: Every day | ORAL | 1 refills | Status: DC

## 2016-10-14 NOTE — Addendum Note (Signed)
Addended by: [name] on: 10/14/2016 02:58 PM   Modules accepted: Orders

## 2016-10-14 NOTE — Patient Instructions (Signed)
Hypertension Hypertension, commonly called high blood pressure, is when the force of blood pumping through your arteries is too strong. Your arteries are the blood vessels that carry blood from your heart throughout your body. A blood pressure reading consists of a higher number over a lower number, such as 110/72. The higher number (systolic) is the pressure inside your arteries when your heart pumps. The lower number (diastolic) is the pressure inside your arteries when your heart relaxes. Ideally you want your blood pressure below 120/80. Hypertension forces your heart to work harder to pump blood. Your arteries may become narrow or stiff. Having untreated or uncontrolled hypertension can cause heart attack, stroke, kidney disease, and other problems. What increases the risk? Some risk factors for high blood pressure are controllable. Others are not. Risk factors you cannot control include:  Race. You may be at higher risk if you are African American.  Age. Risk increases with age.  Gender. Men are at higher risk than women before age 45 years. After age 65, women are at higher risk than men. Risk factors you can control include:  Not getting enough exercise or physical activity.  Being overweight.  Getting too much fat, sugar, calories, or salt in your diet.  Drinking too much alcohol. What are the signs or symptoms? Hypertension does not usually cause signs or symptoms. Extremely high blood pressure (hypertensive crisis) may cause headache, anxiety, shortness of breath, and nosebleed. How is this diagnosed? To check if you have hypertension, your health care provider will measure your blood pressure while you are seated, with your arm held at the level of your heart. It should be measured at least twice using the same arm. Certain conditions can cause a difference in blood pressure between your right and left arms. A blood pressure reading that is higher than normal on one occasion does  not mean that you need treatment. If it is not clear whether you have high blood pressure, you may be asked to return on a different day to have your blood pressure checked again. Or, you may be asked to monitor your blood pressure at home for 1 or more weeks. How is this treated? Treating high blood pressure includes making lifestyle changes and possibly taking medicine. Living a healthy lifestyle can help lower high blood pressure. You may need to change some of your habits. Lifestyle changes may include:  Following the DASH diet. This diet is high in fruits, vegetables, and whole grains. It is low in salt, red meat, and added sugars.  Keep your sodium intake below 2,300 mg per day.  Getting at least 30-45 minutes of aerobic exercise at least 4 times per week.  Losing weight if necessary.  Not smoking.  Limiting alcoholic beverages.  Learning ways to reduce stress. Your health care provider may prescribe medicine if lifestyle changes are not enough to get your blood pressure under control, and if one of the following is true:  You are 18-59 years of age and your systolic blood pressure is above 140.  You are 60 years of age or older, and your systolic blood pressure is above 150.  Your diastolic blood pressure is above 90.  You have diabetes, and your systolic blood pressure is over 140 or your diastolic blood pressure is over 90.  You have kidney disease and your blood pressure is above 140/90.  You have heart disease and your blood pressure is above 140/90. Your personal target blood pressure may vary depending on your medical   conditions, your age, and other factors. Follow these instructions at home:  Have your blood pressure rechecked as directed by your health care provider.  Take medicines only as directed by your health care provider. Follow the directions carefully. Blood pressure medicines must be taken as prescribed. The medicine does not work as well when you skip  doses. Skipping doses also puts you at risk for problems.  Do not smoke.  Monitor your blood pressure at home as directed by your health care provider. Contact a health care provider if:  You think you are having a reaction to medicines taken.  You have recurrent headaches or feel dizzy.  You have swelling in your ankles.  You have trouble with your vision. Get help right away if:  You develop a severe headache or confusion.  You have unusual weakness, numbness, or feel faint.  You have severe chest or abdominal pain.  You vomit repeatedly.  You have trouble breathing. This information is not intended to replace advice given to you by your health care provider. Make sure you discuss any questions you have with your health care provider. Document Released: 10/21/2005 Document Revised: 03/28/2016 Document Reviewed: 08/13/2013 Elsevier Interactive Patient Education  2017 [employer]  

## 2016-10-14 NOTE — Progress Notes (Signed)
Subjective:    Patient ID: Kara Dean, female    DOB: [birth year], 82 y.o.   MRN: [id number]  Patient here today for follow up of chronic medical problems. no changes since last visit. No complaints today.  Outpatient Encounter Prescriptions as of 10/14/2016  Medication Sig  . amLODipine (NORVASC) 10 MG tablet Take 1 tablet (10 mg total) by mouth daily.  Marland Kitchen atorvastatin (LIPITOR) 10 MG tablet Take 1 tablet (10 mg total) by mouth daily at 6 PM.  . B Complex Vitamins (B COMPLEX-B12) TABS Take 1 tablet by mouth daily.    . benzonatate (TESSALON PERLES) 100 MG capsule Take 1 capsule (100 mg total) by mouth 3 (three) times daily as needed for cough.  . dipyridamole-aspirin (AGGRENOX) 200-25 MG 12hr capsule Take 1 capsule by mouth 2 (two) times daily.  Marland Kitchen glipiZIDE (GLIPIZIDE XL) 10 MG 24 hr tablet Take 1 tablet (10 mg total) by mouth daily with breakfast.  . insulin glargine (LANTUS) 100 UNIT/ML injection Inject 0.41 mLs (41 Units total) into the skin every morning.  . Insulin Pen Needle (PEN NEEDLES) 31G X 6 MM MISC USE ONE DAILY AS DIRECTED  . levofloxacin (LEVAQUIN) 750 MG tablet Take 1 tablet (750 mg total) by mouth daily.  Marland Kitchen LORazepam (ATIVAN) 0.5 MG tablet Take 1 tablet (0.5 mg total) by mouth 2 (two) times daily.  . Magnesium 250 MG TABS Take 1 tablet by mouth daily.    . metoprolol succinate (TOPROL-XL) 100 MG 24 hr tablet Take 1 tablet (100 mg total) by mouth daily. Take with or immediately following a meal.  . omega-3 fish oil (MAXEPA) 1000 MG CAPS capsule Take 1 capsule by mouth daily.    . ONE TOUCH ULTRA TEST test strip CHECK BLOOD SUGAR 3 TIMES A DAY OR AS DIRECTED  . pantoprazole (PROTONIX) 40 MG tablet Take 1 tablet (40 mg total) by mouth daily.  . valsartan-hydrochlorothiazide (DIOVAN-HCT) 320-25 MG tablet Take 1 tablet by mouth daily.  . vitamin D, CHOLECALCIFEROL, 400 UNITS tablet Take 400 Units by mouth daily.     No facility-administered encounter medications on file as of  10/14/2016.     Hypertension  This is a chronic problem. The current episode started more than 1 year ago. Associated symptoms include palpitations (Intermittent - goes away with rest). Pertinent negatives include no chest pain, headaches or shortness of breath. Risk factors for coronary artery disease include diabetes mellitus, dyslipidemia and post-menopausal state. Past treatments include ACE inhibitors. The current treatment provides moderate improvement. Compliance problems include diet and exercise.  Hypertensive end-organ damage includes CVA. There is no history of CAD/MI.  Hyperlipidemia  This is a chronic problem. The current episode started more than 1 year ago. Pertinent negatives include no chest pain, myalgias or shortness of breath. Risk factors for coronary artery disease include hypertension, diabetes mellitus and post-menopausal.  Diabetes  She presents for her follow-up diabetic visit. She has type 2 diabetes mellitus. Pertinent negatives for hypoglycemia include no headaches. Pertinent negatives for diabetes include no chest pain, no polydipsia, no polyphagia, no polyuria and no weakness. Diabetic complications include a CVA. Risk factors for coronary artery disease include post-menopausal and diabetes mellitus. Her breakfast blood glucose is taken between 8-9 am. Her breakfast blood glucose range is generally <70 mg/dl. Her dinner blood glucose is taken between 7-8 pm. Her dinner blood glucose range is generally >200 mg/dl. Her overall blood glucose range is >200 mg/dl.  GAD/depression Takes lorzepam as needed. Has not had a  recent in a few weeks.  GERD No symptoms as long as taking Protonix.  Hx TIA/ carotid artery disease Takes aggrenox- No bleeding CAD Currently on aggrenox and metoprolol daily- sees cardiologist every 6  onths and prn- no recent chest pain. CKD/stage 3 Watching creatine for now   Review of Systems  Constitutional: Negative.   HENT: Negative.    Respiratory: Negative for shortness of breath.   Cardiovascular: Positive for palpitations (Intermittent - goes away with rest). Negative for chest pain.  Endocrine: Negative for polydipsia, polyphagia and polyuria.  Musculoskeletal: Negative for myalgias.  Neurological: Negative for weakness and headaches.  Psychiatric/Behavioral: Negative.   All other systems reviewed and are negative.      Objective:   Physical Exam  Constitutional: She is oriented to person, place, and time. She appears well-developed and well-nourished.  HENT:  Right Ear: External ear normal.  Left Ear: External ear normal.  Nose: Nose normal.  Mouth/Throat: Oropharynx is clear and moist.  Eyes: EOM are normal.  Neck: Trachea normal, normal range of motion and full passive range of motion without pain. Neck supple. No JVD present. Carotid bruit is not present. No thyromegaly present.  Cardiovascular: Normal rate, regular rhythm, normal heart sounds and intact distal pulses.  Exam reveals no gallop and no friction rub.   No murmur heard. Pulmonary/Chest: Effort normal and breath sounds normal.  Abdominal: Soft. Bowel sounds are normal. She exhibits no distension and no mass. There is no tenderness.  Musculoskeletal: Normal range of motion.  Lymphadenopathy:    She has no cervical adenopathy.  Neurological: She is alert and oriented to person, place, and time. She has normal reflexes.  Skin: Skin is warm and dry.  Psychiatric: She has a normal mood and affect. Her behavior is normal. Judgment and thought content normal.   BP 137/66   Pulse 65   Temp 97.2 F (36.2 C) (Oral)   Ht 4' 11"  (1.499 m)   Wt 124 lb (56.2 kg)   BMI 25.04 kg/m   Chest x ray normal-Preliminary reading by Ronnald Collum, FNP  [facility]   hgba1c 6.7% down from 6.8% at last visit    Assessment & Plan:  1. Essential hypertension Low sodium diet - CMP14+EGFR - amLODipine (NORVASC) 10 MG tablet; Take 1 tablet (10 mg total) by mouth daily.   Dispense: 90 tablet; Refill: 1 - valsartan-hydrochlorothiazide (DIOVAN-HCT) 320-25 MG tablet; Take 1 tablet by mouth daily.  Dispense: 90 tablet; Refill: 1  2. Type 2 diabetes mellitus with diabetic polyneuropathy, unspecified long term insulin use status (HCC) Carb counting continue - Bayer DCA Hb A1c Waived - Microalbumin / creatinine urine ratio - glipiZIDE (GLIPIZIDE XL) 10 MG 24 hr tablet; Take 1 tablet (10 mg total) by mouth daily with breakfast.  Dispense: 90 tablet; Refill: 1 - insulin glargine (LANTUS) 100 UNIT/ML injection; Inject 0.38 mLs (38 Units total) into the skin every morning.  Dispense: 30 mL; Refill: 5  3. Carotid artery disease without cerebral infarction ([place])  4. Gastroesophageal reflux disease without esophagitis Avoid spicy foods Do not eat 2 hours prior to bedtime - pantoprazole (PROTONIX) 40 MG tablet; Take 1 tablet (40 mg total) by mouth daily.  Dispense: 90 tablet; Refill: 1  5. Type 2 diabetes mellitus treated with insulin (HCC) Discussed- decreased lantus to 38u daily to prevent lows at night Keep diary of blood sugar  6. Recurrent major depressive disorder, in full remission [facility]) Stress management  7. GAD (generalized anxiety disorder) - LORazepam (ATIVAN)  0.5 MG tablet; Take 1 tablet (0.5 mg total) by mouth 2 (two) times daily.  Dispense: 60 tablet; Refill: 2  8. Chronic kidney disease (CKD) stage G3a/A3, moderately decreased glomerular filtration rate (GFR) between 45-59 mL/min/1.73 square meter and albuminuria creatinine ratio greater than 300 mg/g Will continue to watch labs Avoid NSAIDS  9. Coronary artery disease involving native coronary artery of native heart without angina pectoris - metoprolol succinate (TOPROL-XL) 100 MG 24 hr tablet; Take 1 tablet (100 mg total) by mouth daily. Take with or immediately following a meal.  Dispense: 90 tablet; Refill: 1 - dipyridamole-aspirin (AGGRENOX) 200-25 MG 12hr capsule; Take 1 capsule by mouth 2  (two) times daily.  Dispense: 2701 capsule; Refill: 1  10. Mixed hyperlipidemia Low fat diet - Lipid panel - atorvastatin (LIPITOR) 10 MG tablet; Take 1 tablet (10 mg total) by mouth daily at 6 PM.  Dispense: 90 tablet; Refill: 1  11. Wheezing - DG Chest 2 View; Future    Labs pending Health maintenance reviewed Diet and exercise encouraged Continue all meds Follow up  In 3 months   [place], FNP

## 2016-10-15 LAB — CMP14+EGFR
A/G RATIO: 1.8 (ref 1.2–2.2)
ALBUMIN: 4.4 g/dL (ref 3.5–4.7)
ALT: 12 IU/L (ref 0–32)
AST: 21 IU/L (ref 0–40)
Alkaline Phosphatase: 54 IU/L (ref 39–117)
BILIRUBIN TOTAL: 0.4 mg/dL (ref 0.0–1.2)
BUN / CREAT RATIO: 16 (ref 12–28)
BUN: 17 mg/dL (ref 8–27)
CALCIUM: 9.8 mg/dL (ref 8.7–10.3)
CHLORIDE: 88 mmol/L — AB (ref 96–106)
CO2: 23 mmol/L (ref 18–29)
Creatinine, Ser: 1.05 mg/dL — ABNORMAL HIGH (ref 0.57–1.00)
GFR, EST AFRICAN AMERICAN: 58 mL/min/{1.73_m2} — AB (ref >59)
GFR, EST NON AFRICAN AMERICAN: 50 mL/min/{1.73_m2} — AB (ref >59)
GLOBULIN, TOTAL: 2.5 g/dL (ref 1.5–4.5)
Glucose: 138 mg/dL — ABNORMAL HIGH (ref 65–99)
POTASSIUM: 3.6 mmol/L (ref 3.5–5.2)
SODIUM: 130 mmol/L — AB (ref 134–144)
TOTAL PROTEIN: 6.9 g/dL (ref 6.0–8.5)

## 2016-10-15 LAB — LIPID PANEL
CHOL/HDL RATIO: 2.6 ratio (ref 0.0–4.4)
Cholesterol, Total: 109 mg/dL (ref 100–199)
HDL: 42 mg/dL (ref >39)
LDL Calculated: 33 mg/dL (ref 0–99)
Triglycerides: 170 mg/dL — ABNORMAL HIGH (ref 0–149)
VLDL Cholesterol Cal: 34 mg/dL (ref 5–40)

## 2016-10-15 LAB — MICROALBUMIN / CREATININE URINE RATIO
CREATININE, UR: 76.1 mg/dL
MICROALBUM., U, RANDOM: 5.5 ug/mL
Microalb/Creat Ratio: 7.2 mg/g creat (ref 0.0–30.0)

## 2016-10-16 NOTE — Progress Notes (Signed)
Patient aware.

## 2016-10-22 ENCOUNTER — Other Ambulatory Visit: Payer: Self-pay | Admitting: Nurse Practitioner

## 2016-11-25 ENCOUNTER — Ambulatory Visit (INDEPENDENT_AMBULATORY_CARE_PROVIDER_SITE_OTHER): Payer: Medicare Other | Admitting: Cardiovascular Disease

## 2016-11-25 ENCOUNTER — Encounter: Payer: Self-pay | Admitting: Cardiovascular Disease

## 2016-11-25 ENCOUNTER — Encounter (INDEPENDENT_AMBULATORY_CARE_PROVIDER_SITE_OTHER): Payer: Self-pay

## 2016-11-25 VITALS — BP 140/56 | HR 54 | Ht 60.0 in | Wt 124.6 lb

## 2016-11-25 DIAGNOSIS — I10 Essential (primary) hypertension: Secondary | ICD-10-CM

## 2016-11-25 DIAGNOSIS — I6523 Occlusion and stenosis of bilateral carotid arteries: Secondary | ICD-10-CM

## 2016-11-25 MED ORDER — POTASSIUM CHLORIDE ER 10 MEQ PO TBCR: 10.0000 meq | EXTENDED_RELEASE_TABLET | Freq: Every day | ORAL | 3 refills | Status: DC

## 2016-11-25 NOTE — Progress Notes (Signed)
Cardiology Office Note Date:  11/25/2016   ID:  Deandria, Klute 1933/10/31, MRN [id number]  PCP:  [name], FNP  Cardiologist:  Sherren Mocha, MD    Chief Complaint  Patient presents with  . Carotid Artery Disease    f/u from carotid     History of Present Illness: PHINLEY SCHALL is a 83 y.o. female who presents for follow-up evaluation. The patient has a history of TIA, hypertension, type 2 diabetes, and dyslipidemia. She was last seen in June 2016. The patient had a nuclear stress test in 2014 to evaluate chest pain. This showed normal LVEF and normal perfusion.When she was evaluated last year she had experienced a recent episode of syncope. This was felt to be related to poor by mouth intake in the setting of community-acquired pneumonia.  The patient is here with her daughter today. She reports no major change in symptoms. She has occasional cramping in her legs. This is been a big complaint for her. Cramps primarily occurring at night involving her calf muscles and feet. She takes magnesium but this has not helped. She denies leg pain with walking. She does complain of some gait instability. The patient has exertional dyspnea. She has chest pain at times with no relation to physical exertion. Her description is quite vague. His is been a long-standing complaint that she has a stress testing in the past without significant ischemia. She does not appreciate any change in her chest pain symptoms.  Past Medical History:  Diagnosis Date  . Anxiety   . Carotid bruit   . Cataract   . Crohn's colitis ([place])   . CVA (cerebral infarction)   . Depression   . Diabetes mellitus    type 2  . Dyslipidemia   . GERD (gastroesophageal reflux disease)   . History of TIAs   . Hyperlipidemia   . Hypertension   . Hyponatremia   . Osteoarthritis     Past Surgical History:  Procedure Laterality Date  . ABDOMINAL HYSTERECTOMY    . BACK SURGERY  1994  . COLON RESECTION   1990's  . TUBAL LIGATION    . VESICOVAGINAL FISTULA CLOSURE W/ TAH  1984    Current Outpatient Prescriptions  Medication Sig Dispense Refill  . amLODipine (NORVASC) 10 MG tablet Take 1 tablet (10 mg total) by mouth daily. 90 tablet 1  . atorvastatin (LIPITOR) 10 MG tablet Take 1 tablet (10 mg total) by mouth daily at 6 PM. 90 tablet 1  . B Complex Vitamins (B COMPLEX-B12) TABS Take 1 tablet by mouth daily.      Marland Kitchen dipyridamole-aspirin (AGGRENOX) 200-25 MG 12hr capsule Take 1 capsule by mouth 2 (two) times daily. 2701 capsule 1  . fluticasone (FLONASE) 50 MCG/ACT nasal spray Place 2 sprays into both nostrils daily. 16 g 6  . glipiZIDE (GLIPIZIDE XL) 10 MG 24 hr tablet Take 1 tablet (10 mg total) by mouth daily with breakfast. 90 tablet 1  . insulin glargine (LANTUS) 100 UNIT/ML injection Inject 0.38 mLs (38 Units total) into the skin every morning. 30 mL 5  . Insulin Pen Needle (PEN NEEDLES) 31G X 6 MM MISC USE ONE DAILY AS DIRECTED 100 each 1  . LORazepam (ATIVAN) 0.5 MG tablet Take 1 tablet (0.5 mg total) by mouth 2 (two) times daily. 60 tablet 2  . Magnesium 250 MG TABS Take 1 tablet by mouth daily.      . metoprolol succinate (TOPROL-XL) 100 MG 24 hr tablet Take  1 tablet (100 mg total) by mouth daily. Take with or immediately following a meal. 90 tablet 1  . omega-3 fish oil (MAXEPA) 1000 MG CAPS capsule Take 1 capsule by mouth daily.      . ONE TOUCH ULTRA TEST test strip CHECK BLOOD SUGAR 3 TIMES A DAY OR AS DIRECTED 100 each 2  . pantoprazole (PROTONIX) 40 MG tablet Take 1 tablet (40 mg total) by mouth daily. 90 tablet 1  . valsartan-hydrochlorothiazide (DIOVAN-HCT) 320-25 MG tablet Take 1 tablet by mouth daily. 90 tablet 1  . vitamin D, CHOLECALCIFEROL, 400 UNITS tablet Take 400 Units by mouth daily.       No current facility-administered medications for this visit.     Allergies:   Codeine; Latex; Morphine; Niacin; Sulfa antibiotics; and Sulfonamide derivatives   Social History:   The patient  reports that she has never smoked. She has never used smokeless tobacco. She reports that she does not drink alcohol or use drugs.   Family History:  The patient's  family history includes Alcohol abuse in her brother; Coronary artery disease in her brother and sister; Emphysema in her father; Hypertension in her mother; Lung disease in her brother; Seizures in her sister; Stroke in her mother.    ROS:  Please see the history of present illness.  Otherwise, review of systems is positive for Visual changes, easy bruising, leg pain, anxiety, balance problems.  All other systems are reviewed and negative.    PHYSICAL EXAM: VS:  BP (!) 140/56   Pulse (!) 54   Ht 5' (1.524 m)   Wt 124 lb 9.6 oz (56.5 kg)   BMI 24.33 kg/m  , BMI Body mass index is 24.33 kg/m. GEN: Well nourished, well developed, in no acute distress  HEENT: normal  Neck: no JVD, no masses. bilateral carotid bruits Cardiac: RRR without murmur or gallop                Respiratory:  clear to auscultation bilaterally, normal work of breathing GI: soft, nontender, nondistended, + BS MS: no deformity or atrophy  Ext: no pretibial edema Skin: warm and dry, no rash Neuro:  Strength and sensation are intact Psych: euthymic mood, full affect  EKG:  EKG is ordered today. The ekg ordered today shows , left anterior fascicular block, moderate voltage criteria for LVH maybe normal, cannot rule out septal infarct age undetermined.  Recent Labs: 04/06/2016: Hemoglobin 11.8; Platelets 225 10/14/2016: ALT 12; BUN 17; Creatinine, Ser 1.05; Potassium 3.6; Sodium 130   Lipid Panel     Component Value Date/Time   CHOL 109 10/14/2016 1419   CHOL 124 02/26/2013 1043   TRIG 170 (H) 10/14/2016 1419   TRIG 132 03/09/2015 1036   TRIG 182 (H) 02/26/2013 1043   HDL 42 10/14/2016 1419   HDL 39 (L) 03/09/2015 1036   HDL 40 02/26/2013 1043   CHOLHDL 2.6 10/14/2016 1419   CHOLHDL 3.0 11/10/2010 0327   VLDL 25 11/10/2010 0327    LDLCALC 33 10/14/2016 1419   LDLCALC 42 04/27/2014 1455   LDLCALC 48 02/26/2013 1043      Wt Readings from Last 3 Encounters:  11/25/16 124 lb 9.6 oz (56.5 kg)  10/14/16 124 lb (56.2 kg)  07/12/16 122 lb (55.3 kg)     Cardiac Studies Reviewed: Carotid ultrasound 05/21/2016: Less than 40% bilateral ICA stenosis  ASSESSMENT AND PLAN: 1.  Hypertension: Blood pressure is well controlled on amlodipine, metoprolol succinate, and valsartan/HCTZ. No medication changes are  made today.  2. Hyperlipidemia: The patient is treated with atorvastatin 10 mg daily. Lipids are followed by her primary physician.  3. Carotid stenosis without history of stroke: Most recent carotid scan is reviewed with less than 40% bilateral ICA stenosis. Recommend clinical follow-up.  4. Syncope: No recurrence since her last visit here. Suspect this was related to decreased by mouth intake and medical illness.  5. Chest pain, long-standing and atypical: Previous nuclear studies done. Recommend ongoing observation and clinical follow-up unless her symptoms change.  Current medicines are reviewed with the patient today.  The patient does not have concerns regarding medicines.  Labs/ tests ordered today include:  No orders of the defined types were placed in this encounter.   Disposition:   FU 6 months  Signed, Sherren Mocha, MD  11/25/2016 10:18 AM    [facility] [address], [place], [place]  [address] Phone: [phone]; Fax: [phone]

## 2016-11-25 NOTE — Patient Instructions (Signed)
Medication Instructions:  Your physician has recommended you make the following change in your medication:  1. START Potassium Chloride 63mq take one tablet by mouth daily  Labwork: No new orders.   Testing/Procedures: Your physician has requested that you have a carotid duplex in 6 MONTHS. This test is an ultrasound of the carotid arteries in your neck. It looks at blood flow through these arteries that supply the brain with blood. Allow one hour for this exam. There are no restrictions or special instructions.  Follow-Up: Your physician wants you to follow-up in: 6 MONTHS with Dr CBurt Knack You will receive a reminder letter in the mail two months in advance. If you don't receive a letter, please call our office to schedule the follow-up appointment.   Any Other Special Instructions Will Be Listed Below (If Applicable).     If you need a refill on your cardiac medications before your next appointment, please call your pharmacy.

## 2016-12-12 DIAGNOSIS — L84 Corns and callosities: Secondary | ICD-10-CM | POA: Diagnosis not present

## 2016-12-12 DIAGNOSIS — E1142 Type 2 diabetes mellitus with diabetic polyneuropathy: Secondary | ICD-10-CM | POA: Diagnosis not present

## 2016-12-12 DIAGNOSIS — B351 Tinea unguium: Secondary | ICD-10-CM | POA: Diagnosis not present

## 2017-01-21 ENCOUNTER — Encounter: Payer: Self-pay | Admitting: Nurse Practitioner

## 2017-01-21 ENCOUNTER — Ambulatory Visit (INDEPENDENT_AMBULATORY_CARE_PROVIDER_SITE_OTHER): Payer: Medicare Other | Admitting: Nurse Practitioner

## 2017-01-21 VITALS — BP 158/65 | HR 55 | Temp 97.2°F | Ht 60.0 in | Wt 127.0 lb

## 2017-01-21 DIAGNOSIS — I739 Peripheral vascular disease, unspecified: Secondary | ICD-10-CM

## 2017-01-21 DIAGNOSIS — N183 Chronic kidney disease, stage 3 (moderate): Secondary | ICD-10-CM

## 2017-01-21 DIAGNOSIS — E119 Type 2 diabetes mellitus without complications: Secondary | ICD-10-CM

## 2017-01-21 DIAGNOSIS — K219 Gastro-esophageal reflux disease without esophagitis: Secondary | ICD-10-CM | POA: Diagnosis not present

## 2017-01-21 DIAGNOSIS — F411 Generalized anxiety disorder: Secondary | ICD-10-CM

## 2017-01-21 DIAGNOSIS — I10 Essential (primary) hypertension: Secondary | ICD-10-CM | POA: Diagnosis not present

## 2017-01-21 DIAGNOSIS — F3342 Major depressive disorder, recurrent, in full remission: Secondary | ICD-10-CM

## 2017-01-21 DIAGNOSIS — E785 Hyperlipidemia, unspecified: Secondary | ICD-10-CM | POA: Diagnosis not present

## 2017-01-21 DIAGNOSIS — Z794 Long term (current) use of insulin: Secondary | ICD-10-CM

## 2017-01-21 DIAGNOSIS — I779 Disorder of arteries and arterioles, unspecified: Secondary | ICD-10-CM

## 2017-01-21 DIAGNOSIS — N1831 Chronic kidney disease, stage 3a: Secondary | ICD-10-CM

## 2017-01-21 LAB — BAYER DCA HB A1C WAIVED: HB A1C (BAYER DCA - WAIVED): 7.2 % — ABNORMAL HIGH (ref <7.0)

## 2017-01-21 MED ORDER — LORAZEPAM 0.5 MG PO TABS: 0.5000 mg | ORAL_TABLET | Freq: Two times a day (BID) | ORAL | 2 refills | Status: DC

## 2017-01-21 NOTE — Progress Notes (Signed)
Subjective:    Patient ID: Kara Dean, female    DOB: 05-20-1933, 83 y.o.   MRN: [id number]  Patient here today for follow up of chronic medical problems. no changes since last visit. No complaints today.  Outpatient Encounter Prescriptions as of 01/21/2017  Medication Sig  . amLODipine (NORVASC) 10 MG tablet Take 1 tablet (10 mg total) by mouth daily.  Marland Kitchen atorvastatin (LIPITOR) 10 MG tablet Take 1 tablet (10 mg total) by mouth daily at 6 PM.  . B Complex Vitamins (B COMPLEX-B12) TABS Take 1 tablet by mouth daily.    Marland Kitchen dipyridamole-aspirin (AGGRENOX) 200-25 MG 12hr capsule Take 1 capsule by mouth 2 (two) times daily.  . fluticasone (FLONASE) 50 MCG/ACT nasal spray Place 2 sprays into both nostrils daily.  Marland Kitchen glipiZIDE (GLIPIZIDE XL) 10 MG 24 hr tablet Take 1 tablet (10 mg total) by mouth daily with breakfast.  . insulin glargine (LANTUS) 100 UNIT/ML injection Inject 0.38 mLs (38 Units total) into the skin every morning.  . Insulin Pen Needle (PEN NEEDLES) 31G X 6 MM MISC USE ONE DAILY AS DIRECTED  . LORazepam (ATIVAN) 0.5 MG tablet Take 1 tablet (0.5 mg total) by mouth 2 (two) times daily.  . Magnesium 250 MG TABS Take 1 tablet by mouth daily.    . metoprolol succinate (TOPROL-XL) 100 MG 24 hr tablet Take 1 tablet (100 mg total) by mouth daily. Take with or immediately following a meal.  . omega-3 fish oil (MAXEPA) 1000 MG CAPS capsule Take 1 capsule by mouth daily.    . ONE TOUCH ULTRA TEST test strip CHECK BLOOD SUGAR 3 TIMES A DAY OR AS DIRECTED  . pantoprazole (PROTONIX) 40 MG tablet Take 1 tablet (40 mg total) by mouth daily.  . potassium chloride (K-DUR) 10 MEQ tablet Take 1 tablet (10 mEq total) by mouth daily.  . valsartan-hydrochlorothiazide (DIOVAN-HCT) 320-25 MG tablet Take 1 tablet by mouth daily.  . vitamin D, CHOLECALCIFEROL, 400 UNITS tablet Take 400 Units by mouth daily.     No facility-administered encounter medications on file as of 01/21/2017.     Hypertension  This  is a chronic problem. The current episode started more than 1 year ago. Associated symptoms include palpitations (Intermittent - goes away with rest). Pertinent negatives include no chest pain, headaches or shortness of breath. Risk factors for coronary artery disease include diabetes mellitus, dyslipidemia and post-menopausal state. Past treatments include ACE inhibitors. The current treatment provides moderate improvement. Compliance problems include diet and exercise.  Hypertensive end-organ damage includes CVA. There is no history of CAD/MI.  Hyperlipidemia  This is a chronic problem. The current episode started more than 1 year ago. Pertinent negatives include no chest pain, myalgias or shortness of breath. Risk factors for coronary artery disease include hypertension, diabetes mellitus and post-menopausal.  Diabetes  She presents for her follow-up diabetic visit. She has type 2 diabetes mellitus. Pertinent negatives for hypoglycemia include no headaches. Pertinent negatives for diabetes include no chest pain, no polydipsia, no polyphagia, no polyuria and no weakness. Diabetic complications include a CVA. Risk factors for coronary artery disease include post-menopausal and diabetes mellitus. Her breakfast blood glucose is taken between 8-9 am. Her breakfast blood glucose range is generally <70 mg/dl. Her dinner blood glucose is taken between 7-8 pm. Her dinner blood glucose range is generally >200 mg/dl. Her overall blood glucose range is >200 mg/dl.  GAD/depression Takes lorzepam as needed. Has not had a recent in a few weeks.  GERD No  symptoms as long as taking Protonix.  Hx TIA/ carotid artery disease Takes aggrenox- No bleeding CAD Currently on aggrenox and metoprolol daily- sees cardiologist every 6  onths and prn- no recent chest pain. CKD/stage 3 Watching creatine for now   Review of Systems  Constitutional: Negative.   HENT: Negative.   Respiratory: Negative for shortness of breath.    Cardiovascular: Positive for palpitations (Intermittent - goes away with rest). Negative for chest pain.  Endocrine: Negative for polydipsia, polyphagia and polyuria.  Musculoskeletal: Negative for myalgias.  Neurological: Negative for weakness and headaches.  Psychiatric/Behavioral: Negative.   All other systems reviewed and are negative.      Objective:   Physical Exam  Constitutional: She is oriented to person, place, and time. She appears well-developed and well-nourished.  HENT:  Right Ear: External ear normal.  Left Ear: External ear normal.  Nose: Nose normal.  Mouth/Throat: Oropharynx is clear and moist.  Eyes: EOM are normal.  Neck: Trachea normal, normal range of motion and full passive range of motion without pain. Neck supple. No JVD present. Carotid bruit is not present. No thyromegaly present.  Cardiovascular: Normal rate, regular rhythm, normal heart sounds and intact distal pulses.  Exam reveals no gallop and no friction rub.   No murmur heard. Pulmonary/Chest: Effort normal and breath sounds normal.  Abdominal: Soft. Bowel sounds are normal. She exhibits no distension and no mass. There is no tenderness.  Musculoskeletal: Normal range of motion.  Lymphadenopathy:    She has no cervical adenopathy.  Neurological: She is alert and oriented to person, place, and time. She has normal reflexes.  Skin: Skin is warm and dry.  Psychiatric: She has a normal mood and affect. Her behavior is normal. Judgment and thought content normal.   BP (!) 158/65   Pulse (!) 55   Temp 97.2 F (36.2 C) (Oral)   Ht 5' (1.524 m)   Wt 127 lb (57.6 kg)   BMI 24.80 kg/m   Chest x ray normal-Preliminary reading by Ronnald Collum, FNP  [facility]   hgba1c 7.2% up from 6.7% at last visit    Assessment & Plan:  1. Hyperlipidemia, unspecified hyperlipidemia type Low fat diet - CMP14+EGFR - Lipid panel  2. Essential hypertension Low sodium diet - CMP14+EGFR  3. Type 2 diabetes mellitus  treated with insulin (HCC) Arb counting encouragaed - Bayer DCA Hb A1c Waived - CMP14+EGFR  4. Gastroesophageal reflux disease without esophagitis Avoid spicy foods Do not eat 2 hours prior to bedtime - CMP14+EGFR  5. Carotid artery disease without cerebral infarction ([place])  6. Chronic kidney disease (CKD) stage G3a/A3, moderately decreased glomerular filtration rate (GFR) between 45-59 mL/min/1.73 square meter and albuminuria creatinine ratio greater than 300 mg/g Will continue to watch labs  7. Recurrent major depressive disorder, in full remission ([place]) Stress management  8. GAD (generalized anxiety disorder) - LORazepam (ATIVAN) 0.5 MG tablet; Take 1 tablet (0.5 mg total) by mouth 2 (two) times daily.  Dispense: 60 tablet; Refill: 2   Encouraged to get eye exam prior to next visit Labs pending Health maintenance reviewed Diet and exercise encouraged Continue all meds Follow up  In 3 months   [place], FNP

## 2017-01-21 NOTE — Patient Instructions (Signed)
Fall Prevention in the Home Falls can cause injuries. They can happen to people of all ages. There are many things you can do to make your home safe and to help prevent falls. What can I do on the outside of my home?  Regularly fix the edges of walkways and driveways and fix any cracks.  Remove anything that might make you trip as you walk through a door, such as a raised step or threshold.  Trim any bushes or trees on the path to your home.  Use bright outdoor lighting.  Clear any walking paths of anything that might make someone trip, such as rocks or tools.  Regularly check to see if handrails are loose or broken. Make sure that both sides of any steps have handrails.  Any raised decks and porches should have guardrails on the edges.  Have any leaves, snow, or ice cleared regularly.  Use sand or salt on walking paths during winter.  Clean up any spills in your garage right away. This includes oil or grease spills. What can I do in the bathroom?  Use night lights.  Install grab bars by the toilet and in the tub and shower. Do not use towel bars as grab bars.  Use non-skid mats or decals in the tub or shower.  If you need to sit down in the shower, use a plastic, non-slip stool.  Keep the floor dry. Clean up any water that spills on the floor as soon as it happens.  Remove soap buildup in the tub or shower regularly.  Attach bath mats securely with double-sided non-slip rug tape.  Do not have throw rugs and other things on the floor that can make you trip. What can I do in the bedroom?  Use night lights.  Make sure that you have a light by your bed that is easy to reach.  Do not use any sheets or blankets that are too big for your bed. They should not hang down onto the floor.  Have a firm chair that has side arms. You can use this for support while you get dressed.  Do not have throw rugs and other things on the floor that can make you trip. What can I do in the  kitchen?  Clean up any spills right away.  Avoid walking on wet floors.  Keep items that you use a lot in easy-to-reach places.  If you need to reach something above you, use a strong step stool that has a grab bar.  Keep electrical cords out of the way.  Do not use floor polish or wax that makes floors slippery. If you must use wax, use non-skid floor wax.  Do not have throw rugs and other things on the floor that can make you trip. What can I do with my stairs?  Do not leave any items on the stairs.  Make sure that there are handrails on both sides of the stairs and use them. Fix handrails that are broken or loose. Make sure that handrails are as long as the stairways.  Check any carpeting to make sure that it is firmly attached to the stairs. Fix any carpet that is loose or worn.  Avoid having throw rugs at the top or bottom of the stairs. If you do have throw rugs, attach them to the floor with carpet tape.  Make sure that you have a light switch at the top of the stairs and the bottom of the stairs. If you do  not have them, ask someone to add them for you. What else can I do to help prevent falls?  Wear shoes that:  Do not have high heels.  Have rubber bottoms.  Are comfortable and fit you well.  Are closed at the toe. Do not wear sandals.  If you use a stepladder:  Make sure that it is fully opened. Do not climb a closed stepladder.  Make sure that both sides of the stepladder are locked into place.  Ask someone to hold it for you, if possible.  Clearly mark and make sure that you can see:  Any grab bars or handrails.  First and last steps.  Where the edge of each step is.  Use tools that help you move around (mobility aids) if they are needed. These include:  Canes.  Walkers.  Scooters.  Crutches.  Turn on the lights when you go into a dark area. Replace any light bulbs as soon as they burn out.  Set up your furniture so you have a clear path.  Avoid moving your furniture around.  If any of your floors are uneven, fix them.  If there are any pets around you, be aware of where they are.  Review your medicines with your doctor. Some medicines can make you feel dizzy. This can increase your chance of falling. Ask your doctor what other things that you can do to help prevent falls. This information is not intended to replace advice given to you by your health care provider. Make sure you discuss any questions you have with your health care provider. Document Released: 08/17/2009 Document Revised: 03/28/2016 Document Reviewed: 11/25/2014 Elsevier Interactive Patient Education  2017 [employer].

## 2017-01-22 LAB — CMP14+EGFR
ALBUMIN: 4.4 g/dL (ref 3.5–4.7)
ALT: 9 IU/L (ref 0–32)
AST: 13 IU/L (ref 0–40)
Albumin/Globulin Ratio: 1.7 (ref 1.2–2.2)
Alkaline Phosphatase: 61 IU/L (ref 39–117)
BUN / CREAT RATIO: 19 (ref 12–28)
BUN: 22 mg/dL (ref 8–27)
Bilirubin Total: 0.4 mg/dL (ref 0.0–1.2)
CALCIUM: 9.9 mg/dL (ref 8.7–10.3)
CO2: 29 mmol/L (ref 18–29)
CREATININE: 1.17 mg/dL — AB (ref 0.57–1.00)
Chloride: 100 mmol/L (ref 96–106)
GFR calc Af Amer: 50 mL/min/{1.73_m2} — ABNORMAL LOW (ref >59)
GFR, EST NON AFRICAN AMERICAN: 44 mL/min/{1.73_m2} — AB (ref >59)
GLOBULIN, TOTAL: 2.6 g/dL (ref 1.5–4.5)
Glucose: 81 mg/dL (ref 65–99)
Potassium: 3.8 mmol/L (ref 3.5–5.2)
SODIUM: 141 mmol/L (ref 134–144)
Total Protein: 7 g/dL (ref 6.0–8.5)

## 2017-01-22 LAB — LIPID PANEL
CHOL/HDL RATIO: 2.7 ratio (ref 0.0–4.4)
Cholesterol, Total: 109 mg/dL (ref 100–199)
HDL: 40 mg/dL (ref >39)
LDL CALC: 28 mg/dL (ref 0–99)
TRIGLYCERIDES: 206 mg/dL — AB (ref 0–149)
VLDL Cholesterol Cal: 41 mg/dL — ABNORMAL HIGH (ref 5–40)

## 2017-01-24 ENCOUNTER — Other Ambulatory Visit: Payer: Self-pay | Admitting: Nurse Practitioner

## 2017-02-04 ENCOUNTER — Telehealth: Payer: Self-pay | Admitting: Nurse Practitioner

## 2017-02-05 NOTE — Telephone Encounter (Signed)
Done / lat

## 2017-03-13 DIAGNOSIS — L84 Corns and callosities: Secondary | ICD-10-CM | POA: Diagnosis not present

## 2017-03-13 DIAGNOSIS — E1142 Type 2 diabetes mellitus with diabetic polyneuropathy: Secondary | ICD-10-CM | POA: Diagnosis not present

## 2017-03-13 DIAGNOSIS — B351 Tinea unguium: Secondary | ICD-10-CM | POA: Diagnosis not present

## 2017-03-18 ENCOUNTER — Encounter: Payer: Self-pay | Admitting: Nurse Practitioner

## 2017-03-18 ENCOUNTER — Other Ambulatory Visit: Payer: Self-pay | Admitting: Nurse Practitioner

## 2017-03-18 ENCOUNTER — Ambulatory Visit (INDEPENDENT_AMBULATORY_CARE_PROVIDER_SITE_OTHER): Payer: Medicare Other | Admitting: Nurse Practitioner

## 2017-03-18 VITALS — BP 154/60 | HR 52 | Temp 97.9°F | Ht 60.0 in | Wt 129.0 lb

## 2017-03-18 DIAGNOSIS — E1142 Type 2 diabetes mellitus with diabetic polyneuropathy: Secondary | ICD-10-CM

## 2017-03-18 DIAGNOSIS — I10 Essential (primary) hypertension: Secondary | ICD-10-CM | POA: Diagnosis not present

## 2017-03-18 DIAGNOSIS — R609 Edema, unspecified: Secondary | ICD-10-CM | POA: Diagnosis not present

## 2017-03-18 MED ORDER — VALSARTAN 320 MG PO TABS: 320.0000 mg | ORAL_TABLET | Freq: Every day | ORAL | 1 refills | Status: DC

## 2017-03-18 MED ORDER — V-2 HIGH COMPRESSION HOSE MISC: 0 refills | Status: DC

## 2017-03-18 MED ORDER — FUROSEMIDE 20 MG PO TABS: 20.0000 mg | ORAL_TABLET | Freq: Every day | ORAL | 3 refills | Status: DC

## 2017-03-18 NOTE — Progress Notes (Signed)
   Subjective:    Patient ID: Kara Dean, female    DOB: 21-Jul-1933, 83 y.o.   MRN: [id number]  HPI   Error- see note above Review of Systems     Objective:   Physical Exam        Assessment & Plan:

## 2017-03-18 NOTE — Progress Notes (Signed)
   Subjective:    Patient ID: Kara Dean, female    DOB: [birth year], 83 y.o.   MRN: [id number]  HPI Patient brought in today by daughter with c/o feet swelling. Gets worse throughout day and does not completely resolved by morning. She says she props her feet up when she is sitting which helps some. SHe denies any SOB.   Review of Systems  Constitutional: Negative.   Respiratory: Negative.   Cardiovascular: Positive for leg swelling. Negative for chest pain and palpitations.  Gastrointestinal: Negative.   Genitourinary: Negative.   Neurological: Negative.   Psychiatric/Behavioral: Negative.   All other systems reviewed and are negative.      Objective:   Physical Exam  Constitutional: She appears well-developed and well-nourished. No distress.  Cardiovascular: Normal rate.   Pulmonary/Chest: Effort normal. She has rales (bil lower lobes).  Neurological: She is alert.  Skin: Skin is warm.  Psychiatric: She has a normal mood and affect. Her behavior is normal. Judgment and thought content normal.   BP (!) 154/60   Pulse (!) 52   Temp 97.9 F (36.6 C) (Oral)   Ht 5' (1.524 m)   Wt 129 lb (58.5 kg)   BMI 25.19 kg/m      Assessment & Plan:  1. Essential hypertension Low sodium diet Stopped diovan /Hctz- rx for plain divon sent to phamacy Continue to check blood sugar daily and let me know if is stay above 719 systolic - valsartan (DIOVAN) 320 MG tablet; Take 1 tablet (320 mg total) by mouth daily.  Dispense: 90 tablet; Refill: 1  2. Peripheral edema Continue to elevate legs when sitting Added lasux - furosemide (LASIX) 20 MG tablet; Take 1 tablet (20 mg total) by mouth daily.  Dispense: 30 tablet; Refill: 3 - Brain natriuretic peptide - Elastic Bandages & Supports (V-2 HIGH COMPRESSION HOSE) MISC; Wear daily  Dispense: 1 each; Refill: 0  [name], FNP

## 2017-03-18 NOTE — Patient Instructions (Signed)
Peripheral Edema Peripheral edema is swelling that is caused by a buildup of fluid. Peripheral edema most often affects the lower legs, ankles, and feet. It can also develop in the arms, hands, and face. The area of the body that has peripheral edema will look swollen. It may also feel heavy or warm. Your clothes may start to feel tight. Pressing on the area may make a temporary dent in your skin. You may not be able to move your arm or leg as much as usual. There are many causes of peripheral edema. It can be a complication of other diseases, such as congestive heart failure, kidney disease, or a problem with your blood circulation. It also can be a side effect of certain medicines. It often happens to women during pregnancy. Sometimes, the cause is not known. Treating the underlying condition is often the only treatment for peripheral edema. Follow these instructions at home: Pay attention to any changes in your symptoms. Take these actions to help with your discomfort:  Raise (elevate) your legs while you are sitting or lying down.  Move around often to prevent stiffness and to lessen swelling. Do not sit or stand for long periods of time.  Wear support stockings as told by your health care provider.  Follow instructions from your health care provider about limiting salt (sodium) in your diet. Sometimes eating less salt can reduce swelling.  Take over-the-counter and prescription medicines only as told by your health care provider. Your health care provider may prescribe medicine to help your body get rid of excess water (diuretic).  Keep all follow-up visits as told by your health care provider. This is important. Contact a health care provider if:  You have a fever.  Your edema starts suddenly or is getting worse, especially if you are pregnant or have a medical condition.  You have swelling in only one leg.  You have increased swelling and pain in your legs. Get help right away  if:  You develop shortness of breath, especially when you are lying down.  You have pain in your chest or abdomen.  You feel weak.  You faint. This information is not intended to replace advice given to you by your health care provider. Make sure you discuss any questions you have with your health care provider. Document Released: 11/28/2004 Document Revised: 03/25/2016 Document Reviewed: 05/03/2015 Elsevier Interactive Patient Education  2017 [employer].

## 2017-03-19 LAB — BRAIN NATRIURETIC PEPTIDE: BNP: 133.5 pg/mL — ABNORMAL HIGH (ref 0.0–100.0)

## 2017-05-09 ENCOUNTER — Other Ambulatory Visit: Payer: Self-pay | Admitting: Nurse Practitioner

## 2017-05-09 DIAGNOSIS — F411 Generalized anxiety disorder: Secondary | ICD-10-CM

## 2017-05-09 NOTE — Telephone Encounter (Signed)
Last filled 3/20 last seen 05/15

## 2017-05-09 NOTE — Telephone Encounter (Signed)
Please call in lorazepam with 1 refills

## 2017-05-09 NOTE — Telephone Encounter (Signed)
Please see previous note.

## 2017-05-09 NOTE — Telephone Encounter (Signed)
Rx called to pharmacy

## 2017-05-26 ENCOUNTER — Ambulatory Visit (HOSPITAL_COMMUNITY)
Admission: RE | Admit: 2017-05-26 | Discharge: 2017-05-26 | Disposition: A | Discharge status: Home / Self Care | Payer: Medicare Other | Source: Ambulatory Visit | Attending: Cardiology | Admitting: Cardiology

## 2017-05-26 DIAGNOSIS — I6523 Occlusion and stenosis of bilateral carotid arteries: Secondary | ICD-10-CM

## 2017-05-29 DIAGNOSIS — M79676 Pain in unspecified toe(s): Secondary | ICD-10-CM | POA: Diagnosis not present

## 2017-05-29 DIAGNOSIS — B351 Tinea unguium: Secondary | ICD-10-CM | POA: Diagnosis not present

## 2017-05-29 DIAGNOSIS — E1142 Type 2 diabetes mellitus with diabetic polyneuropathy: Secondary | ICD-10-CM | POA: Diagnosis not present

## 2017-05-29 DIAGNOSIS — L84 Corns and callosities: Secondary | ICD-10-CM | POA: Diagnosis not present

## 2017-05-30 ENCOUNTER — Other Ambulatory Visit: Payer: Self-pay | Admitting: Nurse Practitioner

## 2017-05-30 DIAGNOSIS — K219 Gastro-esophageal reflux disease without esophagitis: Secondary | ICD-10-CM

## 2017-06-09 ENCOUNTER — Other Ambulatory Visit: Payer: Self-pay | Admitting: Nurse Practitioner

## 2017-06-09 DIAGNOSIS — E1142 Type 2 diabetes mellitus with diabetic polyneuropathy: Secondary | ICD-10-CM

## 2017-06-09 NOTE — Telephone Encounter (Signed)
Last seen 03/18/17  MMM  Requesting 90 day supply

## 2017-06-10 ENCOUNTER — Other Ambulatory Visit: Payer: Self-pay | Admitting: Nurse Practitioner

## 2017-06-10 DIAGNOSIS — F411 Generalized anxiety disorder: Secondary | ICD-10-CM

## 2017-06-10 NOTE — Telephone Encounter (Signed)
Please call in lorazepam with 0 refills

## 2017-06-10 NOTE — Telephone Encounter (Signed)
Refill called to [facility]

## 2017-06-16 ENCOUNTER — Other Ambulatory Visit: Payer: Self-pay | Admitting: Nurse Practitioner

## 2017-06-16 DIAGNOSIS — E785 Hyperlipidemia, unspecified: Secondary | ICD-10-CM

## 2017-06-17 ENCOUNTER — Encounter: Payer: Self-pay | Admitting: Nurse Practitioner

## 2017-06-17 ENCOUNTER — Ambulatory Visit (INDEPENDENT_AMBULATORY_CARE_PROVIDER_SITE_OTHER): Payer: Medicare Other | Admitting: Nurse Practitioner

## 2017-06-17 VITALS — BP 162/65 | HR 58 | Temp 97.6°F | Ht 60.0 in | Wt 128.0 lb

## 2017-06-17 DIAGNOSIS — I779 Disorder of arteries and arterioles, unspecified: Secondary | ICD-10-CM

## 2017-06-17 DIAGNOSIS — E1142 Type 2 diabetes mellitus with diabetic polyneuropathy: Secondary | ICD-10-CM | POA: Diagnosis not present

## 2017-06-17 DIAGNOSIS — R609 Edema, unspecified: Secondary | ICD-10-CM

## 2017-06-17 DIAGNOSIS — W5503XA Scratched by cat, initial encounter: Secondary | ICD-10-CM

## 2017-06-17 DIAGNOSIS — F411 Generalized anxiety disorder: Secondary | ICD-10-CM | POA: Diagnosis not present

## 2017-06-17 DIAGNOSIS — N183 Chronic kidney disease, stage 3 (moderate): Secondary | ICD-10-CM

## 2017-06-17 DIAGNOSIS — Z794 Long term (current) use of insulin: Secondary | ICD-10-CM

## 2017-06-17 DIAGNOSIS — R600 Localized edema: Secondary | ICD-10-CM

## 2017-06-17 DIAGNOSIS — N1831 Chronic kidney disease, stage 3a: Secondary | ICD-10-CM

## 2017-06-17 DIAGNOSIS — K219 Gastro-esophageal reflux disease without esophagitis: Secondary | ICD-10-CM

## 2017-06-17 DIAGNOSIS — I2510 Atherosclerotic heart disease of native coronary artery without angina pectoris: Secondary | ICD-10-CM | POA: Diagnosis not present

## 2017-06-17 DIAGNOSIS — E785 Hyperlipidemia, unspecified: Secondary | ICD-10-CM

## 2017-06-17 DIAGNOSIS — I10 Essential (primary) hypertension: Secondary | ICD-10-CM

## 2017-06-17 DIAGNOSIS — E7800 Pure hypercholesterolemia, unspecified: Secondary | ICD-10-CM

## 2017-06-17 DIAGNOSIS — F3342 Major depressive disorder, recurrent, in full remission: Secondary | ICD-10-CM | POA: Diagnosis not present

## 2017-06-17 DIAGNOSIS — I739 Peripheral vascular disease, unspecified: Secondary | ICD-10-CM

## 2017-06-17 DIAGNOSIS — E119 Type 2 diabetes mellitus without complications: Secondary | ICD-10-CM | POA: Diagnosis not present

## 2017-06-17 LAB — BAYER DCA HB A1C WAIVED: HB A1C (BAYER DCA - WAIVED): 6.7 % (ref <7.0)

## 2017-06-17 MED ORDER — INSULIN GLARGINE 100 UNIT/ML ~~LOC~~ SOLN: 38.0000 [IU] | Freq: Every morning | SUBCUTANEOUS | 5 refills | Status: DC

## 2017-06-17 MED ORDER — OLMESARTAN MEDOXOMIL 40 MG PO TABS: 40.0000 mg | ORAL_TABLET | Freq: Every day | ORAL | 5 refills | Status: DC

## 2017-06-17 MED ORDER — ASPIRIN-DIPYRIDAMOLE ER 25-200 MG PO CP12: 1.0000 | ORAL_CAPSULE | Freq: Two times a day (BID) | ORAL | 1 refills | Status: DC

## 2017-06-17 MED ORDER — GLIPIZIDE ER 10 MG PO TB24: ORAL_TABLET | ORAL | 1 refills | Status: DC

## 2017-06-17 MED ORDER — FUROSEMIDE 20 MG PO TABS: 20.0000 mg | ORAL_TABLET | Freq: Every day | ORAL | 3 refills | Status: DC

## 2017-06-17 MED ORDER — POTASSIUM CHLORIDE ER 10 MEQ PO TBCR
10.0000 meq | EXTENDED_RELEASE_TABLET | Freq: Every day | ORAL | 1 refills | Status: DC
Start: 2017-06-17 — End: 2018-03-21

## 2017-06-17 MED ORDER — LORAZEPAM 0.5 MG PO TABS: ORAL_TABLET | ORAL | 2 refills | Status: DC

## 2017-06-17 MED ORDER — VALSARTAN 320 MG PO TABS: 320.0000 mg | ORAL_TABLET | Freq: Every day | ORAL | 1 refills | Status: DC

## 2017-06-17 MED ORDER — PANTOPRAZOLE SODIUM 40 MG PO TBEC: 40.0000 mg | DELAYED_RELEASE_TABLET | Freq: Every day | ORAL | 1 refills | Status: DC

## 2017-06-17 MED ORDER — AMLODIPINE BESYLATE 10 MG PO TABS: 10.0000 mg | ORAL_TABLET | Freq: Every day | ORAL | 1 refills | Status: DC

## 2017-06-17 MED ORDER — METOPROLOL SUCCINATE ER 100 MG PO TB24: 100.0000 mg | ORAL_TABLET | Freq: Every day | ORAL | 1 refills | Status: DC

## 2017-06-17 MED ORDER — ATORVASTATIN CALCIUM 10 MG PO TABS: 10.0000 mg | ORAL_TABLET | Freq: Every day | ORAL | 1 refills | Status: DC

## 2017-06-17 MED ORDER — AMOXICILLIN-POT CLAVULANATE 875-125 MG PO TABS: 1.0000 | ORAL_TABLET | Freq: Two times a day (BID) | ORAL | 0 refills | Status: DC

## 2017-06-17 NOTE — Patient Instructions (Signed)
Cat-Scratch Disease Cat-scratch disease is a rare infection that can be passed to people through the scratch or bite of an infected cat. The infection causes a red bump at the site of the bite or scratch. It may also cause swollen lymph glands and other symptoms. In most cases, the infection is mild and does not cause serious problems. However, a more severe infection can develop in people who have other illnesses or problems that weaken their body's defense system (immune system). What are the causes? This condition is caused by a type of bacteria called Bartonella henselae. These bacteria are present in the mouth or on the claws of cats. What are the signs or symptoms? Common symptoms of this condition include:  A red and sore pimple or bump-with or without pus-on the skin where the cat scratched or bit. The pimple or sore may be present for as long as 3 weeks after the scratch or bite occurred.  One or more enlarged lymph glands located toward the center of the body from where the injury occurred.  Other symptoms include:  Low-grade fever.  Tiredness and fatigue.  Headache.  Sore throat.  How is this diagnosed? This condition may be diagnosed based on your symptoms and history of a scratch or bite from a cat. Your health care provider will examine the skin sore and look for swollen lymph glands. You may also have tests, such as:  Culture tests of any fluid or pus from the injury site.  Blood tests.  Removal of a tissue sample from a swollen lymph gland (biopsy) to be looked at under a microscope. This may be done to confirm the diagnosis and to make sure that a different infection or disease is not causing your illness.  How is this treated? If the condition is mild, treatment may not be needed. You may be advised to take pain medicine and apply heat to the affected area. A more severe infection can be treated with antibiotic medicine. People who have immune system problems will  usually be treated with antibiotics because they are at risk for developing a severe infection. These include people who have HIV or AIDS, people who take medicines that may modify their immune system, and people who have had an organ transplant. Follow these instructions at home:  Rest until you feel better.  Take over-the-counter and prescription medicines only as told by your health care provider.  If you were prescribed an antibiotic medicine, take it as told by your health care provider. Do not stop taking the antibiotic even if you start to feel better.  Keep the area of the cat scratch clean. Wash it with soap and water, or apply an antiseptic solution such as povidone-iodine.  If directed, apply heat to the scratch area: ? Put a towel between your skin and a heat pack or heating pad. ? Leave the heat on for 20-30 minutes or as told by your health care provider. How is this prevented?  Avoid injury while playing with cats.  Wash well after playing with cats.  Do not let your cat lick sores on your body.  Do not let your cat roam around outside of your house. Contact a health care provider if:  You have increased redness, swelling, or pain at the site of the scratch.  You have fluid, blood, or pus coming from the scratch area.  You have a fever. Get help right away if:  You have increased swelling of your lymph glands.  You  develop pain in your abdomen.  You develop a skin rash.  You feel dizzy or you pass out.  You have a worsening headache.  You develop inflammation of your eye or have increasing vision problems.  You have pain in one of your bones.  You develop a stiff neck. This information is not intended to replace advice given to you by your health care provider. Make sure you discuss any questions you have with your health care provider. Document Released: 10/18/2000 Document Revised: 03/28/2016 Document Reviewed: 01/24/2015 Elsevier Interactive Patient  Education  [employer].

## 2017-06-17 NOTE — Progress Notes (Signed)
Subjective:    Patient ID: Kara Dean, female    DOB: 10-01-1933, 83 y.o.   MRN: [id number]  HPI  Kara Dean is here today for follow up of chronic medical problem.  Outpatient Encounter Prescriptions as of 06/17/2017  Medication Sig  . amLODipine (NORVASC) 10 MG tablet Take 1 tablet (10 mg total) by mouth daily.  Marland Kitchen atorvastatin (LIPITOR) 10 MG tablet Take 1 tablet (10 mg total) by mouth daily.  . B Complex Vitamins (B COMPLEX-B12) TABS Take 1 tablet by mouth daily.    Marland Kitchen dipyridamole-aspirin (AGGRENOX) 200-25 MG 12hr capsule Take 1 capsule by mouth 2 (two) times daily.  . Elastic Bandages & Supports (V-2 HIGH COMPRESSION HOSE) MISC Wear daily  . fluticasone (FLONASE) 50 MCG/ACT nasal spray USE 2 SPRAYS IN EACH NOSTRIL ONCE DAILY.  . furosemide (LASIX) 20 MG tablet Take 1 tablet (20 mg total) by mouth daily.  Marland Kitchen glipiZIDE (GLIPIZIDE XL) 10 MG 24 hr tablet TAKE (1) TABLET DAILY WITH BREAKFAST.  Marland Kitchen glucose blood (ONE TOUCH ULTRA TEST) test strip Test tid. DX E11.9  . insulin glargine (LANTUS) 100 UNIT/ML injection Inject 0.38 mLs (38 Units total) into the skin every morning.  Marland Kitchen LORazepam (ATIVAN) 0.5 MG tablet TAKE  (1)  TABLET TWICE A DAY.  . Magnesium 250 MG TABS Take 1 tablet by mouth daily.    . metoprolol succinate (TOPROL-XL) 100 MG 24 hr tablet Take 1 tablet (100 mg total) by mouth daily. Take with or immediately following a meal.  . omega-3 fish oil (MAXEPA) 1000 MG CAPS capsule Take 1 capsule by mouth daily.    . pantoprazole (PROTONIX) 40 MG tablet Take 1 tablet (40 mg total) by mouth daily.  . potassium chloride (K-DUR) 10 MEQ tablet Take 1 tablet (10 mEq total) by mouth daily.  Marland Kitchen ULTICARE MINI PEN NEEDLES 31G X 6 MM MISC USE ONE DAILY AS DIRECTED  . valsartan (DIOVAN) 320 MG tablet Take 1 tablet (320 mg total) by mouth daily.  . vitamin D, CHOLECALCIFEROL, 400 UNITS tablet Take 400 Units by mouth daily.       1. Essential hypertension  No c/o chest pain, sob or  headache. Does not check blood pressure at home  2. Hyperlipidemia, unspecified hyperlipidemia type  Does not really watchdiet  3. Type 2 diabetes mellitus treated with insulin (HCC)  Last hgab1c was 7.2%. Does not check blood sugars every day. No hypoglycemia that she is aware of.  4. Carotid artery disease without cerebral infarction [facility])  Denies nay cognitive problems  5. Gastroesophageal reflux disease without esophagitis  Takes protonix daily  6. Chronic kidney disease (CKD) stage G3a/A3, moderately decreased glomerular filtration rate (GFR) between 45-59 mL/min/1.73 square meter and albuminuria creatinine ratio greater than 300 mg/g  We check labs whenever patient comes in for appointment. deneis any worsening edema  7. Peripheral edema  Takes lasix daily and elevates legs when sitting  8. GAD (generalized anxiety disorder)  Patient takes ativan and says she gets very anxious when she does not take  9. Recurrent major depressive disorder, in full remission [facility])  Depression screen [facility] 2/9 06/17/2017 03/18/2017 01/21/2017 10/14/2016 07/12/2016  Decreased Interest 0 1 0 1 0  Down, Depressed, Hopeless 0 1 0 1 0  PHQ - 2 Score 0 2 0 2 0  Altered sleeping - 0 - 3 -  Tired, decreased energy - 3 - 2 -  Change in appetite - 0 - 2 -  Feeling bad  or failure about yourself  - 2 - 1 -  Trouble concentrating - 0 - 1 -  Moving slowly or fidgety/restless - 2 - 0 -  Suicidal thoughts - 0 - 0 -  PHQ-9 Score - 9 - 11 -  Difficult doing work/chores - - - - -  Some recent data might be hidden     10. Coronary artery disease involving native coronary artery of native heart without angina pectoris sees cardiology at least yearly  11. Pure hypercholesterolemia  Again does not watch diet  12. Type 2 diabetes mellitus with diabetic polyneuropathy, with long-term current use of insulin ([place]) addressed above    New complaints: Was scratched by a cat on right lower leg 1 week ago and looks  infected  Social history: Daughter checks on her daily    Review of Systems  Constitutional: Negative for activity change and appetite change.  HENT: Negative.   Eyes: Negative for pain.  Respiratory: Negative for shortness of breath.   Cardiovascular: Negative for chest pain, palpitations and leg swelling.  Gastrointestinal: Negative for abdominal pain.  Endocrine: Negative for polydipsia.  Genitourinary: Negative.   Skin: Negative for rash.  Neurological: Negative for dizziness, weakness and headaches.  Hematological: Does not bruise/bleed easily.  Psychiatric/Behavioral: Negative.   All other systems reviewed and are negative.      Objective:   Physical Exam  Constitutional: She is oriented to person, place, and time. She appears well-developed and well-nourished.  HENT:  Nose: Nose normal.  Mouth/Throat: Oropharynx is clear and moist.  Eyes: EOM are normal.  Neck: Trachea normal, normal range of motion and full passive range of motion without pain. Neck supple. No JVD present. Carotid bruit is not present. No thyromegaly present.  Cardiovascular: Normal rate, regular rhythm, normal heart sounds and intact distal pulses.  Exam reveals no gallop and no friction rub.   No murmur heard. Pulmonary/Chest: Effort normal and breath sounds normal.  Abdominal: Soft. Bowel sounds are normal. She exhibits no distension and no mass. There is no tenderness.  Musculoskeletal: Normal range of motion.  Lymphadenopathy:    She has no cervical adenopathy.  Neurological: She is alert and oriented to person, place, and time. She has normal reflexes.  Skin: Skin is warm and dry.  Psychiatric: She has a normal mood and affect. Her behavior is normal. Judgment and thought content normal.   BP (!) 162/65   Pulse (!) 58   Temp 97.6 F (36.4 C) (Oral)   Ht 5' (1.524 m)   Wt 128 lb (58.1 kg)   BMI 25.00 kg/m   Hgba1c 6.7%     Assessment & Plan:  1. Essential hypertension Low sodium  diet - CMP14+EGFR Changed valsartan to benicar due to valsartan recall - amLODipine (NORVASC) 10 MG tablet; Take 1 tablet (10 mg total) by mouth daily.  Dispense: 90 tablet; Refill: 1 - potassium chloride (K-DUR) 10 MEQ tablet; Take 1 tablet (10 mEq total) by mouth daily.  Dispense: 90 tablet; Refill: 1 - olmesartan (BENICAR) 40 MG tablet; Take 1 tablet (40 mg total) by mouth daily.  Dispense: 30 tablet; Refill: 5  2. Hyperlipidemia, unspecified hyperlipidemia type Low fat diet - Lipid panel  3. Carotid artery disease without cerebral infarction [facility]) Carotid doppler done 3 weeks ago was  Normal- will check yearly  4. Gastroesophageal reflux disease without esophagitis Avoid spicy foods Do not eat 2 hours prior to bedtime - pantoprazole (PROTONIX) 40 MG tablet; Take 1 tablet (40 mg  total) by mouth daily.  Dispense: 90 tablet; Refill: 1  5. Chronic kidney disease (CKD) stage G3a/A3, moderately decreased glomerular filtration rate (GFR) between 45-59 mL/min/1.73 square meter and albuminuria creatinine ratio greater than 300 mg/g Will check labs  6. Peripheral edema Elevate legs when sitting Compression hose daily when up- can take of at night when sleeping - furosemide (LASIX) 20 MG tablet; Take 1 tablet (20 mg total) by mouth daily.  Dispense: 30 tablet; Refill: 3  7. GAD (generalized anxiety disorder) Stress management - LORazepam (ATIVAN) 0.5 MG tablet; TAKE  (1)  TABLET TWICE A DAY.  Dispense: 60 tablet; Refill: 2  8. Recurrent major depressive disorder, in full remission ([place])  9. Coronary artery disease involving native coronary artery of native heart without angina pectoris Keep follow up appointments with cardiology - metoprolol succinate (TOPROL-XL) 100 MG 24 hr tablet; Take 1 tablet (100 mg total) by mouth daily. Take with or immediately following a meal.  Dispense: 90 tablet; Refill: 1 - dipyridamole-aspirin (AGGRENOX) 200-25 MG 12hr capsule; Take 1 capsule by mouth 2  (two) times daily.  Dispense: 2701 capsule; Refill: 1  10. Pure hypercholesterolemia - atorvastatin (LIPITOR) 10 MG tablet; Take 1 tablet (10 mg total) by mouth daily.  Dispense: 90 tablet; Refill: 1  11. Type 2 diabetes mellitus with diabetic polyneuropathy, with long-term current use of insulin (HCC) Continue to watch carbsin diet - Bayer DCA Hb A1c Waived - glipiZIDE (GLIPIZIDE XL) 10 MG 24 hr tablet; TAKE (1) TABLET DAILY WITH BREAKFAST.  Dispense: 90 tablet; Refill: 1  12. Cat scratch Clean with antibacterial soap bid Keep covered - amoxicillin-clavulanate (AUGMENTIN) 875-125 MG tablet; Take 1 tablet by mouth 2 (two) times daily.  Dispense: 20 tablet; Refill: 0    Labs pending Health maintenance reviewed Diet and exercise encouraged Continue all meds Follow up  In 3 months   [place], FNP

## 2017-06-18 LAB — CMP14+EGFR
ALBUMIN: 4.5 g/dL (ref 3.5–4.7)
ALK PHOS: 68 IU/L (ref 39–117)
ALT: 9 IU/L (ref 0–32)
AST: 16 IU/L (ref 0–40)
Albumin/Globulin Ratio: 1.7 (ref 1.2–2.2)
BUN / CREAT RATIO: 14 (ref 12–28)
BUN: 14 mg/dL (ref 8–27)
Bilirubin Total: 0.4 mg/dL (ref 0.0–1.2)
CALCIUM: 10.5 mg/dL — AB (ref 8.7–10.3)
CO2: 23 mmol/L (ref 20–29)
CREATININE: 0.97 mg/dL (ref 0.57–1.00)
Chloride: 103 mmol/L (ref 96–106)
GFR, EST AFRICAN AMERICAN: 63 mL/min/{1.73_m2} (ref >59)
GFR, EST NON AFRICAN AMERICAN: 55 mL/min/{1.73_m2} — AB (ref >59)
GLOBULIN, TOTAL: 2.7 g/dL (ref 1.5–4.5)
Glucose: 58 mg/dL — ABNORMAL LOW (ref 65–99)
Potassium: 4.4 mmol/L (ref 3.5–5.2)
SODIUM: 141 mmol/L (ref 134–144)
TOTAL PROTEIN: 7.2 g/dL (ref 6.0–8.5)

## 2017-06-18 LAB — LIPID PANEL
CHOL/HDL RATIO: 2.5 ratio (ref 0.0–4.4)
Cholesterol, Total: 106 mg/dL (ref 100–199)
HDL: 43 mg/dL (ref >39)
LDL CALC: 34 mg/dL (ref 0–99)
Triglycerides: 147 mg/dL (ref 0–149)
VLDL Cholesterol Cal: 29 mg/dL (ref 5–40)

## 2017-07-15 DIAGNOSIS — Z7984 Long term (current) use of oral hypoglycemic drugs: Secondary | ICD-10-CM | POA: Diagnosis not present

## 2017-07-15 DIAGNOSIS — Z794 Long term (current) use of insulin: Secondary | ICD-10-CM | POA: Diagnosis not present

## 2017-07-15 DIAGNOSIS — H25813 Combined forms of age-related cataract, bilateral: Secondary | ICD-10-CM | POA: Diagnosis not present

## 2017-07-15 DIAGNOSIS — E119 Type 2 diabetes mellitus without complications: Secondary | ICD-10-CM | POA: Diagnosis not present

## 2017-07-15 LAB — HM DIABETES EYE EXAM

## 2017-08-07 DIAGNOSIS — E1142 Type 2 diabetes mellitus with diabetic polyneuropathy: Secondary | ICD-10-CM | POA: Diagnosis not present

## 2017-08-07 DIAGNOSIS — B351 Tinea unguium: Secondary | ICD-10-CM | POA: Diagnosis not present

## 2017-08-07 DIAGNOSIS — M79676 Pain in unspecified toe(s): Secondary | ICD-10-CM | POA: Diagnosis not present

## 2017-08-07 DIAGNOSIS — L84 Corns and callosities: Secondary | ICD-10-CM | POA: Diagnosis not present

## 2017-09-11 DIAGNOSIS — H52223 Regular astigmatism, bilateral: Secondary | ICD-10-CM | POA: Diagnosis not present

## 2017-09-11 DIAGNOSIS — H25813 Combined forms of age-related cataract, bilateral: Secondary | ICD-10-CM | POA: Diagnosis not present

## 2017-09-11 DIAGNOSIS — E119 Type 2 diabetes mellitus without complications: Secondary | ICD-10-CM | POA: Diagnosis not present

## 2017-09-12 ENCOUNTER — Other Ambulatory Visit: Payer: Self-pay | Admitting: Nurse Practitioner

## 2017-09-16 ENCOUNTER — Encounter (HOSPITAL_COMMUNITY): Payer: Self-pay

## 2017-09-16 ENCOUNTER — Encounter (HOSPITAL_COMMUNITY)
Admission: RE | Admit: 2017-09-16 | Discharge: 2017-09-16 | Disposition: A | Discharge status: Home / Self Care | Payer: Medicare Other | Source: Ambulatory Visit | Attending: Ophthalmology | Admitting: Ophthalmology

## 2017-09-16 ENCOUNTER — Other Ambulatory Visit: Payer: Self-pay

## 2017-09-16 DIAGNOSIS — I739 Peripheral vascular disease, unspecified: Secondary | ICD-10-CM | POA: Diagnosis not present

## 2017-09-16 DIAGNOSIS — F418 Other specified anxiety disorders: Secondary | ICD-10-CM | POA: Diagnosis not present

## 2017-09-16 DIAGNOSIS — H269 Unspecified cataract: Secondary | ICD-10-CM | POA: Diagnosis not present

## 2017-09-16 DIAGNOSIS — Z8673 Personal history of transient ischemic attack (TIA), and cerebral infarction without residual deficits: Secondary | ICD-10-CM | POA: Diagnosis not present

## 2017-09-16 DIAGNOSIS — Z79899 Other long term (current) drug therapy: Secondary | ICD-10-CM | POA: Diagnosis not present

## 2017-09-16 DIAGNOSIS — H25812 Combined forms of age-related cataract, left eye: Secondary | ICD-10-CM | POA: Diagnosis not present

## 2017-09-16 DIAGNOSIS — E119 Type 2 diabetes mellitus without complications: Secondary | ICD-10-CM | POA: Diagnosis not present

## 2017-09-16 DIAGNOSIS — K219 Gastro-esophageal reflux disease without esophagitis: Secondary | ICD-10-CM | POA: Diagnosis not present

## 2017-09-16 DIAGNOSIS — I10 Essential (primary) hypertension: Secondary | ICD-10-CM | POA: Diagnosis not present

## 2017-09-16 LAB — CBC WITH DIFFERENTIAL/PLATELET
BASOS ABS: 0 10*3/uL (ref 0.0–0.1)
BASOS PCT: 0 %
Eosinophils Absolute: 0.2 10*3/uL (ref 0.0–0.7)
Eosinophils Relative: 3 %
HCT: 38.7 % (ref 36.0–46.0)
HEMOGLOBIN: 12.8 g/dL (ref 12.0–15.0)
Lymphocytes Relative: 19 %
Lymphs Abs: 1.5 10*3/uL (ref 0.7–4.0)
MCH: 30.4 pg (ref 26.0–34.0)
MCHC: 33.1 g/dL (ref 30.0–36.0)
MCV: 91.9 fL (ref 78.0–100.0)
MONOS PCT: 10 %
Monocytes Absolute: 0.8 10*3/uL (ref 0.1–1.0)
NEUTROS ABS: 5.4 10*3/uL (ref 1.7–7.7)
NEUTROS PCT: 68 %
Platelets: 211 10*3/uL (ref 150–400)
RBC: 4.21 MIL/uL (ref 3.87–5.11)
RDW: 12.8 % (ref 11.5–15.5)
WBC: 7.9 10*3/uL (ref 4.0–10.5)

## 2017-09-16 LAB — BASIC METABOLIC PANEL
ANION GAP: 9 (ref 5–15)
BUN: 14 mg/dL (ref 6–20)
CALCIUM: 10.2 mg/dL (ref 8.9–10.3)
CHLORIDE: 102 mmol/L (ref 101–111)
CO2: 25 mmol/L (ref 22–32)
CREATININE: 1.06 mg/dL — AB (ref 0.44–1.00)
GFR calc non Af Amer: 48 mL/min — ABNORMAL LOW (ref >60)
GFR, EST AFRICAN AMERICAN: 55 mL/min — AB (ref >60)
Glucose, Bld: 136 mg/dL — ABNORMAL HIGH (ref 65–99)
Potassium: 3.8 mmol/L (ref 3.5–5.1)
SODIUM: 136 mmol/L (ref 135–145)

## 2017-09-16 LAB — GLUCOSE, CAPILLARY: GLUCOSE-CAPILLARY: 133 mg/dL — AB (ref 65–99)

## 2017-09-16 NOTE — Patient Instructions (Signed)
Your procedure is scheduled on: 09/19/2017   Report to [facility] at  33   AM.  Call this number if you have problems the morning of surgery: [phone]   Do not eat food or drink liquids :After Midnight.      Take these medicines the morning of surgery with A SIP OF WATER: amlodipine, ativan, metorprolol, benicar, protonix. Take 19 units of insulin the morning of your procedure.   Do not wear jewelry, make-up or nail polish.  Do not wear lotions, powders, or perfumes. You may wear deodorant.  Do not shave 48 hours prior to surgery.  Do not bring valuables to the hospital.  Contacts, dentures or bridgework may not be worn into surgery.  Leave suitcase in the car. After surgery it may be brought to your room.  For patients admitted to the hospital, checkout time is 11:00 AM the day of discharge.   Patients discharged the day of surgery will not be allowed to drive home.  :     Please read over the following fact sheets that you were given: Coughing and Deep Breathing, Surgical Site Infection Prevention, Anesthesia Post-op Instructions and Care and Recovery After Surgery    Cataract A cataract is a clouding of the lens of the eye. When a lens becomes cloudy, vision is reduced based on the degree and nature of the clouding. Many cataracts reduce vision to some degree. Some cataracts make people more near-sighted as they develop. Other cataracts increase glare. Cataracts that are ignored and become worse can sometimes look white. The white color can be seen through the pupil. CAUSES   Aging. However, cataracts may occur at any age, even in newborns.   Certain drugs.   Trauma to the eye.   Certain diseases such as diabetes.   Specific eye diseases such as chronic inflammation inside the eye or a sudden attack of a rare form of glaucoma.   Inherited or acquired medical problems.  SYMPTOMS   Gradual, progressive drop in vision in the affected eye.   Severe, rapid visual loss. This  most often happens when trauma is the cause.  DIAGNOSIS  To detect a cataract, an eye doctor examines the lens. Cataracts are best diagnosed with an exam of the eyes with the pupils enlarged (dilated) by drops.  TREATMENT  For an early cataract, vision may improve by using different eyeglasses or stronger lighting. If that does not help your vision, surgery is the only effective treatment. A cataract needs to be surgically removed when vision loss interferes with your everyday activities, such as driving, reading, or watching TV. A cataract may also have to be removed if it prevents examination or treatment of another eye problem. Surgery removes the cloudy lens and usually replaces it with a substitute lens (intraocular lens, IOL).  At a time when both you and your doctor agree, the cataract will be surgically removed. If you have cataracts in both eyes, only one is usually removed at a time. This allows the operated eye to heal and be out of danger from any possible problems after surgery (such as infection or poor wound healing). In rare cases, a cataract may be doing damage to your eye. In these cases, your caregiver may advise surgical removal right away. The vast majority of people who have cataract surgery have better vision afterward. HOME CARE INSTRUCTIONS  If you are not planning surgery, you may be asked to do the following:  Use different eyeglasses.   Use  stronger or brighter lighting.   Ask your eye doctor about reducing your medicine dose or changing medicines if it is thought that a medicine caused your cataract. Changing medicines does not make the cataract go away on its own.   Become familiar with your surroundings. Poor vision can lead to injury. Avoid bumping into things on the affected side. You are at a higher risk for tripping or falling.   Exercise extreme care when driving or operating machinery.   Wear sunglasses if you are sensitive to bright light or experiencing  problems with glare.  SEEK IMMEDIATE MEDICAL CARE IF:   You have a worsening or sudden vision loss.   You notice redness, swelling, or increasing pain in the eye.   You have a fever.  Document Released: 10/21/2005 Document Revised: 10/10/2011 Document Reviewed: 06/14/2011 [facility] Patient Information 2012 [place].PATIENT INSTRUCTIONS POST-ANESTHESIA  IMMEDIATELY FOLLOWING SURGERY:  Do not drive or operate machinery for the first twenty four hours after surgery.  Do not make any important decisions for twenty four hours after surgery or while taking narcotic pain medications or sedatives.  If you develop intractable nausea and vomiting or a severe headache please notify your doctor immediately.  FOLLOW-UP:  Please make an appointment with your surgeon as instructed. You do not need to follow up with anesthesia unless specifically instructed to do so.  WOUND CARE INSTRUCTIONS (if applicable):  Keep a dry clean dressing on the anesthesia/puncture wound site if there is drainage.  Once the wound has quit draining you may leave it open to air.  Generally you should leave the bandage intact for twenty four hours unless there is drainage.  If the epidural site drains for more than 36-48 hours please call the anesthesia department.  QUESTIONS?:  Please feel free to call your physician or the hospital operator if you have any questions, and they will be happy to assist you.

## 2017-09-16 NOTE — Pre-Procedure Instructions (Signed)
HR 49 on EKG. She is on 100 mg Metoprolol daily. Dr Patsey Berthold made aware and per his order patient was instructed to hold Metoprolol the morning of surgery. She asnd daughter verbalized understanding of this.

## 2017-09-19 ENCOUNTER — Ambulatory Visit (HOSPITAL_COMMUNITY)
Admission: RE | Admit: 2017-09-19 | Discharge: 2017-09-19 | Disposition: A | Discharge status: Home / Self Care | Payer: Medicare Other | Source: Ambulatory Visit | Attending: Ophthalmology | Admitting: Ophthalmology

## 2017-09-19 ENCOUNTER — Encounter (HOSPITAL_COMMUNITY)
Admission: RE | Disposition: A | Discharge status: Home / Self Care | Payer: Self-pay | Source: Ambulatory Visit | Attending: Ophthalmology

## 2017-09-19 ENCOUNTER — Ambulatory Visit (HOSPITAL_COMMUNITY): Payer: Medicare Other | Admitting: Anesthesiology

## 2017-09-19 ENCOUNTER — Encounter (HOSPITAL_COMMUNITY): Payer: Self-pay | Admitting: *Deleted

## 2017-09-19 DIAGNOSIS — H2512 Age-related nuclear cataract, left eye: Secondary | ICD-10-CM | POA: Diagnosis not present

## 2017-09-19 DIAGNOSIS — H25812 Combined forms of age-related cataract, left eye: Secondary | ICD-10-CM | POA: Diagnosis not present

## 2017-09-19 DIAGNOSIS — I739 Peripheral vascular disease, unspecified: Secondary | ICD-10-CM | POA: Diagnosis not present

## 2017-09-19 DIAGNOSIS — E119 Type 2 diabetes mellitus without complications: Secondary | ICD-10-CM | POA: Insufficient documentation

## 2017-09-19 DIAGNOSIS — K219 Gastro-esophageal reflux disease without esophagitis: Secondary | ICD-10-CM | POA: Insufficient documentation

## 2017-09-19 DIAGNOSIS — H269 Unspecified cataract: Secondary | ICD-10-CM | POA: Diagnosis not present

## 2017-09-19 DIAGNOSIS — I10 Essential (primary) hypertension: Secondary | ICD-10-CM | POA: Insufficient documentation

## 2017-09-19 DIAGNOSIS — Z79899 Other long term (current) drug therapy: Secondary | ICD-10-CM | POA: Diagnosis not present

## 2017-09-19 DIAGNOSIS — Z8673 Personal history of transient ischemic attack (TIA), and cerebral infarction without residual deficits: Secondary | ICD-10-CM | POA: Diagnosis not present

## 2017-09-19 DIAGNOSIS — F418 Other specified anxiety disorders: Secondary | ICD-10-CM | POA: Insufficient documentation

## 2017-09-19 HISTORY — PX: CATARACT EXTRACTION W/PHACO: SHX586

## 2017-09-19 LAB — GLUCOSE, CAPILLARY: GLUCOSE-CAPILLARY: 123 mg/dL — AB (ref 65–99)

## 2017-09-19 SURGERY — PHACOEMULSIFICATION, CATARACT, WITH IOL INSERTION
Anesthesia: Monitor Anesthesia Care | Site: Eye | Laterality: Left

## 2017-09-19 MED ORDER — BSS IO SOLN
INTRAOCULAR | Status: DC | PRN
  Administered 2017-09-19: 500 mL

## 2017-09-19 MED ORDER — TETRACAINE HCL 0.5 % OP SOLN
1.0000 [drp] | OPHTHALMIC | Status: AC
  Administered 2017-09-19 (×3): 1 [drp] via OPHTHALMIC

## 2017-09-19 MED ORDER — CYCLOPENTOLATE-PHENYLEPHRINE 0.2-1 % OP SOLN
1.0000 [drp] | OPHTHALMIC | Status: AC
  Administered 2017-09-19 (×3): 1 [drp] via OPHTHALMIC

## 2017-09-19 MED ORDER — PROVISC 10 MG/ML IO SOLN
INTRAOCULAR | Status: DC | PRN
  Administered 2017-09-19: 0.85 mL via INTRAOCULAR

## 2017-09-19 MED ORDER — LIDOCAINE HCL 3.5 % OP GEL
1.0000 "application " | Freq: Once | OPHTHALMIC | Status: AC
  Administered 2017-09-19: 1 via OPHTHALMIC

## 2017-09-19 MED ORDER — MIDAZOLAM HCL 2 MG/2ML IJ SOLN
1.0000 mg | INTRAMUSCULAR | Status: AC
  Administered 2017-09-19 (×2): 1 mg via INTRAVENOUS
  Filled 2017-09-19: qty 2

## 2017-09-19 MED ORDER — LIDOCAINE HCL (PF) 1 % IJ SOLN
INTRAOCULAR | Status: DC | PRN
  Administered 2017-09-19: 1 mL via OPHTHALMIC

## 2017-09-19 MED ORDER — FENTANYL CITRATE (PF) 100 MCG/2ML IJ SOLN
25.0000 ug | Freq: Once | INTRAMUSCULAR | Status: AC
  Administered 2017-09-19: 25 ug via INTRAVENOUS
  Filled 2017-09-19: qty 2

## 2017-09-19 MED ORDER — NEOMYCIN-POLYMYXIN-DEXAMETH 3.5-10000-0.1 OP SUSP
OPHTHALMIC | Status: DC | PRN
  Administered 2017-09-19: 2 [drp] via OPHTHALMIC

## 2017-09-19 MED ORDER — LACTATED RINGERS IV SOLN
INTRAVENOUS | Status: DC
  Administered 2017-09-19: 11:00:00 via INTRAVENOUS

## 2017-09-19 MED ORDER — SODIUM HYALURONATE 23 MG/ML IO SOLN
INTRAOCULAR | Status: DC | PRN
  Administered 2017-09-19: 0.6 mL via INTRAOCULAR

## 2017-09-19 MED ORDER — BSS IO SOLN
INTRAOCULAR | Status: DC | PRN
  Administered 2017-09-19: 15 mL

## 2017-09-19 MED ORDER — POVIDONE-IODINE 5 % OP SOLN
OPHTHALMIC | Status: DC | PRN
  Administered 2017-09-19: 1 via OPHTHALMIC

## 2017-09-19 MED ORDER — PHENYLEPHRINE HCL 2.5 % OP SOLN
1.0000 [drp] | OPHTHALMIC | Status: AC
  Administered 2017-09-19 (×3): 1 [drp] via OPHTHALMIC

## 2017-09-19 SURGICAL SUPPLY — 16 items
CLOTH BEACON ORANGE TIMEOUT ST (SAFETY) ×3 IMPLANT
EYE SHIELD UNIVERSAL CLEAR (GAUZE/BANDAGES/DRESSINGS) ×3 IMPLANT
GLOVE BIOGEL PI IND STRL 6.5 (GLOVE) ×1 IMPLANT
GLOVE BIOGEL PI IND STRL 7.0 (GLOVE) ×1 IMPLANT
GLOVE BIOGEL PI IND STRL 7.5 (GLOVE) ×1 IMPLANT
GLOVE BIOGEL PI INDICATOR 6.5 (GLOVE) ×2
GLOVE BIOGEL PI INDICATOR 7.0 (GLOVE) ×2
GLOVE BIOGEL PI INDICATOR 7.5 (GLOVE) ×2
LENS ALC ACRYL/TECN (Ophthalmic Related) ×3 IMPLANT
NEEDLE HYPO 18GX1.5 BLUNT FILL (NEEDLE) ×3 IMPLANT
PAD ARMBOARD 7.5X6 YLW CONV (MISCELLANEOUS) ×3 IMPLANT
SYR TB 1ML LL NO SAFETY (SYRINGE) ×3 IMPLANT
TAPE SURG TRANSPORE 1 IN (GAUZE/BANDAGES/DRESSINGS) ×1 IMPLANT
TAPE SURGICAL TRANSPORE 1 IN (GAUZE/BANDAGES/DRESSINGS) ×2
VISCOELASTIC ADDITIONAL (OPHTHALMIC RELATED) ×3 IMPLANT
WATER STERILE IRR 250ML POUR (IV SOLUTION) ×3 IMPLANT

## 2017-09-19 NOTE — Discharge Instructions (Signed)
Please discharge patient when stable, will follow up today with Dr. Marisa Hua at the [facility] office immediately following discharge.  Leave shield in place until visit.  All paperwork with discharge instructions will be given at the office.   Moderate Conscious Sedation, Adult, Care After These instructions provide you with information about caring for yourself after your procedure. Your health care provider may also give you more specific instructions. Your treatment has been planned according to current medical practices, but problems sometimes occur. Call your health care provider if you have any problems or questions after your procedure. What can I expect after the procedure? After your procedure, it is common:  To feel sleepy for several hours.  To feel clumsy and have poor balance for several hours.  To have poor judgment for several hours.  To vomit if you eat too soon.  Follow these instructions at home: For at least 24 hours after the procedure:   Do not: ? Participate in activities where you could fall or become injured. ? Drive. ? Use heavy machinery. ? Drink alcohol. ? Take sleeping pills or medicines that cause drowsiness. ? Make important decisions or sign legal documents. ? Take care of children on your own.  Rest. Eating and drinking  Follow the diet recommended by your health care provider.  If you vomit: ? Drink water, juice, or soup when you can drink without vomiting. ? Make sure you have little or no nausea before eating solid foods. General instructions  Have a responsible adult stay with you until you are awake and alert.  Take over-the-counter and prescription medicines only as told by your health care provider.  If you smoke, do not smoke without supervision.  Keep all follow-up visits as told by your health care provider. This is important. Contact a health care provider if:  You keep feeling nauseous or you keep vomiting.  You  feel light-headed.  You develop a rash.  You have a fever. Get help right away if:  You have trouble breathing. This information is not intended to replace advice given to you by your health care provider. Make sure you discuss any questions you have with your health care provider. Document Released: 08/11/2013 Document Revised: 03/25/2016 Document Reviewed: 02/10/2016 Elsevier Interactive Patient Education  [employer].

## 2017-09-19 NOTE — Op Note (Signed)
Date of procedure: 09/19/17  Pre-operative diagnosis: Visually significant cataract, Left Eye  Post-operative diagnosis: Visually significant cataract, Left Eye  Procedure: Removal of cataract via phacoemulsification and insertion of intra-ocular lens AMO PCB00  +23.5D into the capsular bag of the Left Eye  Attending surgeon: Gerda Diss. Taler Kushner, MD, MA  Anesthesia: MAC, Topical Akten  Complications: None  Estimated Blood Loss: <74m (minimal)  Specimens: None  Implants: As above  Indications:  Visually significant cataract, Left Eye  Procedure:  The patient was seen and identified in the pre-operative area. The operative eye was identified and dilated.  The operative eye was marked.  Topical anesthesia was administered to the operative eye.     The patient was then to the operative suite and placed in the supine position.  A timeout was performed confirming the patient, procedure to be performed, and all other relevant information.   The patient's face was prepped and draped in the usual fashion for intra-ocular surgery.  A lid speculum was placed into the operative eye and the surgical microscope moved into place and focused.  A superotemporal paracentesis was created using a 20 gauge paracentesis blade.  Shugarcaine was injected into the anterior chamber.  Viscoelastic was injected into the anterior chamber.  A temporal clear-corneal main wound incision was created using a 2.451mmicrokeratome.  A continuous curvilinear capsulorrhexis was initiated using an irrigating cystitome and completed using capsulorrhexis forceps.  Hydrodissection and hydrodeliniation were performed.  Viscoelastic was injected into the anterior chamber.  A phacoemulsification handpiece and a chopper as a second instrument were used to remove the nucleus and epinucleus. The irrigation/aspiration handpiece was used to remove any remaining cortical material.   The capsular bag was reinflated with viscoelastic, checked,  and found to be intact.  The intraocular lens was inserted into the capsular bag and dialed into place using a Kuglen hook.  The irrigation/aspiration handpiece was used to remove any remaining viscoelastic.  The clear corneal wound and paracentesis wounds were then hydrated and checked with Weck-Cels to be watertight.  The lid-speculum and drape was removed, and the patient's face was cleaned with a wet and dry 4x4.  Maxitrol was instilled in the eye before a clear shield was taped over the eye. The patient was taken to the post-operative care unit in good condition, having tolerated the procedure well.  Post-Op Instructions: [facility] and will receive all other orders and instructions.

## 2017-09-19 NOTE — Transfer of Care (Signed)
Immediate Anesthesia Transfer of Care Note  Patient: Kara Dean  Procedure(s) Performed: CATARACT EXTRACTION PHACO AND INTRAOCULAR LENS PLACEMENT (IOC) (Left Eye)  Patient Location: Short Stay  Anesthesia Type:MAC  Level of Consciousness: awake  Airway & Oxygen Therapy: Patient Spontanous Breathing  Post-op Assessment: Report given to RN  Post vital signs: Reviewed and stable  Last Vitals:  Vitals:   09/19/17 1055 09/19/17 1100  BP: (!) 139/48 (!) 126/51  Pulse:    Resp: 18 (!) 38  Temp:    SpO2: 92% 91%    Last Pain:  Vitals:   09/19/17 1025  TempSrc: Oral         Complications: No apparent anesthesia complications

## 2017-09-19 NOTE — Anesthesia Preprocedure Evaluation (Signed)
Anesthesia Evaluation  Patient identified by MRN, date of birth, ID band Patient awake    Reviewed: Allergy & Precautions, NPO status , Patient's Chart, lab work & pertinent test results  Airway Mallampati: II  TM Distance: >3 FB     Dental  (+) Edentulous Lower, Edentulous Upper   Pulmonary neg pulmonary ROS,    breath sounds clear to auscultation       Cardiovascular hypertension, Pt. on medications + Peripheral Vascular Disease (carotid disease)   Rhythm:Regular Rate:Bradycardia     Neuro/Psych PSYCHIATRIC DISORDERS Anxiety Depression CVA    GI/Hepatic GERD  ,  Endo/Other  diabetes, Type 2  Renal/GU Renal InsufficiencyRenal disease     Musculoskeletal   Abdominal   Peds  Hematology   Anesthesia Other Findings   Reproductive/Obstetrics                             Anesthesia Physical Anesthesia Plan  ASA: III  Anesthesia Plan: MAC   Post-op Pain Management:    Induction: Intravenous  PONV Risk Score and Plan:   Airway Management Planned: Nasal Cannula  Additional Equipment:   Intra-op Plan:   Post-operative Plan:   Informed Consent: I have reviewed the patients History and Physical, chart, labs and discussed the procedure including the risks, benefits and alternatives for the proposed anesthesia with the patient or authorized representative who has indicated his/her understanding and acceptance.     Plan Discussed with:   Anesthesia Plan Comments:         Anesthesia Quick Evaluation

## 2017-09-19 NOTE — Anesthesia Postprocedure Evaluation (Signed)
Anesthesia Post Note  Patient: Kara Dean  Procedure(s) Performed: CATARACT EXTRACTION PHACO AND INTRAOCULAR LENS PLACEMENT ([place]) (Left Eye)  Patient location during evaluation: Short Stay Anesthesia Type: MAC Level of consciousness: awake and alert and oriented Pain management: pain level controlled Vital Signs Assessment: post-procedure vital signs reviewed and stable Respiratory status: spontaneous breathing Cardiovascular status: blood pressure returned to baseline Postop Assessment: no apparent nausea or vomiting Anesthetic complications: no     Last Vitals:  Vitals:   09/19/17 1055 09/19/17 1100  BP: (!) 139/48 (!) 126/51  Pulse:    Resp: 18 (!) 38  Temp:    SpO2: 92% 91%    Last Pain:  Vitals:   09/19/17 1025  TempSrc: Oral                 Bellamarie Pflug

## 2017-09-19 NOTE — H&P (Signed)
The H and P was reviewed and updated. The patient was examined.  No changes were found after exam.  The surgical eye was marked.

## 2017-09-22 ENCOUNTER — Encounter (HOSPITAL_COMMUNITY): Payer: Self-pay | Admitting: Ophthalmology

## 2017-09-30 ENCOUNTER — Telehealth: Payer: Self-pay

## 2017-09-30 NOTE — Telephone Encounter (Signed)
ntbs

## 2017-09-30 NOTE — Telephone Encounter (Signed)
Patient having dizziness  HR is 42  On Metoprolol  She is a fall risk and recommending PCS services  She has Medicaid  She needs a transfer bench and has a high depression score of 12  Patient said she has previously asked for an antidepressant and not been given one

## 2017-09-30 NOTE — Telephone Encounter (Signed)
Patient has an appt tomorrow

## 2017-10-01 ENCOUNTER — Ambulatory Visit (INDEPENDENT_AMBULATORY_CARE_PROVIDER_SITE_OTHER): Payer: Medicare Other | Admitting: Family Medicine

## 2017-10-01 NOTE — Progress Notes (Deleted)
Subjective: CC:*** PCP: Kara Pretty, FNP HYW:VPXT Kara Dean is a 83 y.o. female presenting to clinic today for:  1. ***  Allergies  Allergen Reactions  . Codeine Other (See Comments)    unknown  . Latex Rash  . Morphine Other (See Comments)    unknown  . Niacin Other (See Comments)    unknown  . Sulfa Antibiotics Rash  . Sulfonamide Derivatives Rash   Past Medical History:  Diagnosis Date  . Anxiety   . Carotid bruit   . Cataract   . Crohn's colitis ([place])   . CVA (cerebral infarction)    No deficits  . Depression   . Diabetes mellitus    type 2  . Dyslipidemia   . GERD (gastroesophageal reflux disease)   . History of TIAs    no deficits  . Hyperlipidemia   . Hypertension   . Hyponatremia   . Osteoarthritis    Family History  Problem Relation Age of Onset  . Coronary artery disease Sister        stent x 3  . Seizures Sister   . Coronary artery disease Brother        stent  . Hypertension Mother   . Stroke Mother   . Emphysema Father   . Alcohol abuse Brother   . Lung disease Brother     Current Outpatient Medications:  .  amLODipine (NORVASC) 10 MG tablet, Take 1 tablet (10 mg total) by mouth daily., Disp: 90 tablet, Rfl: 1 .  atorvastatin (LIPITOR) 10 MG tablet, Take 1 tablet (10 mg total) by mouth daily., Disp: 90 tablet, Rfl: 1 .  B Complex Vitamins (B COMPLEX-B12) TABS, Take 1 tablet by mouth daily.  , Disp: , Rfl:  .  Cholecalciferol (VITAMIN D3 PO), Take 1 capsule daily by mouth., Disp: , Rfl:  .  dipyridamole-aspirin (AGGRENOX) 200-25 MG 12hr capsule, Take 1 capsule by mouth 2 (two) times daily., Disp: 2701 capsule, Rfl: 1 .  fluticasone (FLONASE) 50 MCG/ACT nasal spray, USE 2 SPRAYS IN EACH NOSTRIL ONCE DAILY. (Patient taking differently: USE 2 SPRAYS IN EACH NOSTRIL ONCE DAILY AS NEEDED FOR ALLERGIES), Disp: 16 g, Rfl: 1 .  furosemide (LASIX) 20 MG tablet, Take 1 tablet (20 mg total) by mouth daily., Disp: 30 tablet, Rfl: 3 .   glipiZIDE (GLIPIZIDE XL) 10 MG 24 hr tablet, TAKE (1) TABLET DAILY WITH BREAKFAST. (Patient taking differently: Take 10 mg daily with breakfast by mouth. ), Disp: 90 tablet, Rfl: 1 .  ibuprofen (ADVIL,MOTRIN) 200 MG tablet, Take 400 mg every 6 (six) hours as needed by mouth for headache or moderate pain., Disp: , Rfl:  .  insulin glargine (LANTUS) 100 UNIT/ML injection, Inject 0.38 mLs (38 Units total) into the skin every morning., Disp: 30 mL, Rfl: 5 .  LORazepam (ATIVAN) 0.5 MG tablet, TAKE  (1)  TABLET TWICE A DAY. (Patient taking differently: Take 0.5 mg 2 (two) times daily by mouth. ), Disp: 60 tablet, Rfl: 2 .  Magnesium 250 MG TABS, Take 250 mg daily by mouth. , Disp: , Rfl:  .  metoprolol succinate (TOPROL-XL) 100 MG 24 hr tablet, Take 1 tablet (100 mg total) by mouth daily. Take with or immediately following a meal., Disp: 90 tablet, Rfl: 1 .  olmesartan (BENICAR) 40 MG tablet, Take 1 tablet (40 mg total) by mouth daily., Disp: 30 tablet, Rfl: 5 .  omega-3 fish oil (MAXEPA) 1000 MG CAPS capsule, Take 1 capsule by mouth daily.  ,  Disp: , Rfl:  .  pantoprazole (PROTONIX) 40 MG tablet, Take 1 tablet (40 mg total) by mouth daily., Disp: 90 tablet, Rfl: 1 .  potassium chloride (K-DUR) 10 MEQ tablet, Take 1 tablet (10 mEq total) by mouth daily., Disp: 90 tablet, Rfl: 1  Social Hx: *** smoker.  Health Maintenance: *** Flu Vaccine needed: {YES/NO/WILD CARDS:18581}  Tdap Vaccine needed: {YES/NO/WILD CARDS:18581}  - every 85yr - (<3 lifetime doses or unknown): all wounds -- look up need for Tetanus IG - (>=3 lifetime doses): clean/minor wound if >158yrfrom previous; all other wounds if >5y40yrrom previous Zoster Vaccine needed: {YES/NO/WILD CARDS:18581} (those >50yo, once) Pneumonia Vaccine needed: {YES/NO/WILD CARDS:18581} (those w/ risk factors) - (<65y44yrth: Immunocompromised, cochlear implant, CSF leak, asplenic, sickle cell, Chronic Renal Failure - (<62yr74yrV-23 only: Heart dz, lung  disease, DM, tobacco abuse, alcoholism, cirrhosis/liver disease. - (>62yr)68yrV13 then PPSV23 in 6-12mths;  - (>62yr):59yrat PPSV23 once if pt received prior to 83yo and 66yrs ha62yrassed   ROS: Per HPI  Objective: Office vital signs reviewed. There were no vitals taken for this visit.  Physical Examination:  General: Awake, alert, *** nourished, No acute distress HEENT: Normal    Neck: No masses palpated. No lymphadenopathy    Ears: Tympanic membranes intact, normal light reflex, no erythema, no bulging    Eyes: PERRLA, extraocular membranes intact, sclera ***    Nose: nasal turbinates moist, *** nasal discharge    Throat: moist mucus membranes, no erythema, *** tonsillar exudate.  Airway is patent Cardio: regular rate and rhythm, S1S2 heard, no murmurs appreciated Pulm: clear to auscultation bilaterally, no wheezes, rhonchi or rales; normal work of breathing on room air GI: soft, non-tender, non-distended, bowel sounds present x4, no hepatomegaly, no splenomegaly, no masses GU: external vaginal tissue ***, cervix ***, *** punctate lesions on cervix appreciated, *** discharge from cervical os, *** bleeding, *** cervical motion tenderness, *** abdominal/ adnexal masses Extremities: warm, well perfused, No edema, cyanosis or clubbing; +*** pulses bilaterally MSK: *** gait and *** station Skin: dry; intact; no rashes or lesions Neuro: *** Strength and light touch sensation grossly intact, *** DTRs ***/4  Assessment/ Plan: 83 y.o. 39male   ***  No orders of the defined types were placed in this encounter.  No orders of the defined types were placed in this encounter.    Kara Dean M Janora Norlandertern [name]

## 2017-10-01 NOTE — Progress Notes (Signed)
error 

## 2017-10-02 DIAGNOSIS — H25811 Combined forms of age-related cataract, right eye: Secondary | ICD-10-CM | POA: Diagnosis not present

## 2017-10-06 ENCOUNTER — Encounter (HOSPITAL_COMMUNITY)
Admission: RE | Admit: 2017-10-06 | Discharge: 2017-10-06 | Disposition: A | Discharge status: Home / Self Care | Payer: Medicare Other | Source: Ambulatory Visit | Attending: Ophthalmology | Admitting: Ophthalmology

## 2017-10-06 ENCOUNTER — Encounter (HOSPITAL_COMMUNITY): Payer: Self-pay

## 2017-10-07 ENCOUNTER — Encounter: Payer: Self-pay | Admitting: Nurse Practitioner

## 2017-10-07 ENCOUNTER — Ambulatory Visit (INDEPENDENT_AMBULATORY_CARE_PROVIDER_SITE_OTHER): Payer: Medicare Other | Admitting: Nurse Practitioner

## 2017-10-07 VITALS — BP 168/68 | HR 66 | Temp 98.2°F | Ht 60.0 in | Wt 124.0 lb

## 2017-10-07 DIAGNOSIS — E119 Type 2 diabetes mellitus without complications: Secondary | ICD-10-CM | POA: Diagnosis not present

## 2017-10-07 DIAGNOSIS — F3342 Major depressive disorder, recurrent, in full remission: Secondary | ICD-10-CM | POA: Diagnosis not present

## 2017-10-07 DIAGNOSIS — K219 Gastro-esophageal reflux disease without esophagitis: Secondary | ICD-10-CM

## 2017-10-07 DIAGNOSIS — Z794 Long term (current) use of insulin: Secondary | ICD-10-CM | POA: Diagnosis not present

## 2017-10-07 DIAGNOSIS — N1831 Chronic kidney disease, stage 3a: Secondary | ICD-10-CM

## 2017-10-07 DIAGNOSIS — F411 Generalized anxiety disorder: Secondary | ICD-10-CM | POA: Diagnosis not present

## 2017-10-07 DIAGNOSIS — E785 Hyperlipidemia, unspecified: Secondary | ICD-10-CM | POA: Diagnosis not present

## 2017-10-07 DIAGNOSIS — I6930 Unspecified sequelae of cerebral infarction: Secondary | ICD-10-CM

## 2017-10-07 DIAGNOSIS — I10 Essential (primary) hypertension: Secondary | ICD-10-CM

## 2017-10-07 DIAGNOSIS — R609 Edema, unspecified: Secondary | ICD-10-CM

## 2017-10-07 DIAGNOSIS — N183 Chronic kidney disease, stage 3 (moderate): Secondary | ICD-10-CM

## 2017-10-07 LAB — BAYER DCA HB A1C WAIVED: HB A1C (BAYER DCA - WAIVED): 6.6 % (ref <7.0)

## 2017-10-07 MED ORDER — LORAZEPAM 0.5 MG PO TABS: 0.5000 mg | ORAL_TABLET | Freq: Two times a day (BID) | ORAL | 3 refills | Status: DC

## 2017-10-07 MED ORDER — CITALOPRAM HYDROBROMIDE 20 MG PO TABS: 20.0000 mg | ORAL_TABLET | Freq: Every day | ORAL | 5 refills | Status: DC

## 2017-10-07 NOTE — Progress Notes (Signed)
Subjective:    Patient ID: Kara Dean, female    DOB: 09-Jun-1933, 83 y.o.   MRN: [id number]  HPI  Meda Dudzinski Heinrichs is here today for follow up of chronic medical problem.  Outpatient Encounter Medications as of 10/07/2017  Medication Sig  . amLODipine (NORVASC) 10 MG tablet Take 1 tablet (10 mg total) by mouth daily.  Marland Kitchen atorvastatin (LIPITOR) 10 MG tablet Take 1 tablet (10 mg total) by mouth daily.  . B Complex Vitamins (B COMPLEX-B12) TABS Take 1 tablet by mouth daily.    . Cholecalciferol (VITAMIN D3 PO) Take 1 capsule daily by mouth.  . dipyridamole-aspirin (AGGRENOX) 200-25 MG 12hr capsule Take 1 capsule by mouth 2 (two) times daily.  . fluticasone (FLONASE) 50 MCG/ACT nasal spray USE 2 SPRAYS IN EACH NOSTRIL ONCE DAILY. (Patient taking differently: USE 2 SPRAYS IN EACH NOSTRIL ONCE DAILY AS NEEDED FOR ALLERGIES)  . furosemide (LASIX) 20 MG tablet Take 1 tablet (20 mg total) by mouth daily.  Marland Kitchen glipiZIDE (GLIPIZIDE XL) 10 MG 24 hr tablet TAKE (1) TABLET DAILY WITH BREAKFAST. (Patient taking differently: Take 10 mg daily with breakfast by mouth. )  . ibuprofen (ADVIL,MOTRIN) 200 MG tablet Take 400 mg every 6 (six) hours as needed by mouth for headache or moderate pain.  Marland Kitchen insulin glargine (LANTUS) 100 UNIT/ML injection Inject 0.38 mLs (38 Units total) into the skin every morning.  Marland Kitchen LORazepam (ATIVAN) 0.5 MG tablet TAKE  (1)  TABLET TWICE A DAY. (Patient taking differently: Take 0.5 mg 2 (two) times daily by mouth. )  . Magnesium 250 MG TABS Take 250 mg daily by mouth.   . metoprolol succinate (TOPROL-XL) 100 MG 24 hr tablet Take 1 tablet (100 mg total) by mouth daily. Take with or immediately following a meal.  . olmesartan (BENICAR) 40 MG tablet Take 1 tablet (40 mg total) by mouth daily.  Marland Kitchen omega-3 fish oil (MAXEPA) 1000 MG CAPS capsule Take 1 capsule by mouth daily.    . pantoprazole (PROTONIX) 40 MG tablet Take 1 tablet (40 mg total) by mouth daily.  . potassium chloride (K-DUR)  10 MEQ tablet Take 1 tablet (10 mEq total) by mouth daily.  . prednisoLONE acetate (PRED FORTE) 1 % ophthalmic suspension Place 1 drop into the left eye daily.     1. Essential hypertension  No c/o chest pain, SOB or headache. Does not check blood pressures at home. BP Readings from Last 3 Encounters:  09/19/17 (!) 155/57  09/16/17 (!) 145/53  06/17/17 (!) 162/65     2. Hyperlipidemia, unspecified hyperlipidemia type  Does not watch diet at all  3. Type 2 diabetes mellitus treated with insulin (HCC)  Last HGBA!C was 6.7%. Blood sugars in the mornings are 70-80  4. Late effect of cerebrovascular accident (CVA)  No residual effects  5. Gastroesophageal reflux disease without esophagitis  Uses protonix daily- has bad symptoms if does not take  6. Chronic kidney disease (CKD) stage G3a/A3, moderately decreased glomerular filtration rate (GFR) between 45-59 mL/min/1.73 square meter and albuminuria creatinine ratio greater than 300 mg/g (HCC)  Labs to be checked today  7. Recurrent major depressive disorder, in full remission [facility])  Patient currently not on any antidepressant. She says sh ereally does not want to be on anything at this time. Insurance has recommended he start something. Depression screen [facility] 2/9 10/07/2017 06/17/2017 03/18/2017  Decreased Interest 2 3 1   Down, Depressed, Hopeless 2 2 1   PHQ - 2 Score 4  5 2  Altered sleeping 3 2 0  Tired, decreased energy 2 3 3   Change in appetite 2 3 0  Feeling bad or failure about yourself  2 2 2   Trouble concentrating 3 0 0  Moving slowly or fidgety/restless 0 0 2  Suicidal thoughts 0 0 0  PHQ-9 Score 16 15 9   Difficult doing work/chores - - -  Some recent data might be hidden     8. GAD (generalized anxiety disorder)  Is on lorazepam daily- says she gets every anxious if does not take  9. Peripheral edema  Lasix works well to keep edema under control  10.    hypokalemia          No c/o lower ext cramping    New  complaints: Haing trouble when taking shower- needs hand held shower head so she can sit in shower chair and take a  bath  Social history: Lives alone- has a lot going on with her family   Review of Systems  Constitutional: Negative.   HENT: Negative.   Respiratory: Negative.   Cardiovascular: Negative.   Gastrointestinal: Negative.   Genitourinary: Negative.   Neurological: Negative.   Psychiatric/Behavioral: Negative.   All other systems reviewed and are negative.      Objective:   Physical Exam  Constitutional: She is oriented to person, place, and time. She appears well-developed and well-nourished.  HENT:  Nose: Nose normal.  Mouth/Throat: Oropharynx is clear and moist.  Eyes: EOM are normal.  Neck: Trachea normal, normal range of motion and full passive range of motion without pain. Neck supple. No JVD present. Carotid bruit is not present. No thyromegaly present.  Cardiovascular: Normal rate, regular rhythm, normal heart sounds and intact distal pulses. Exam reveals no gallop and no friction rub.  No murmur heard. Pulmonary/Chest: Effort normal and breath sounds normal.  Abdominal: Soft. Bowel sounds are normal. She exhibits no distension and no mass. There is no tenderness.  Musculoskeletal: Normal range of motion.  Lymphadenopathy:    She has no cervical adenopathy.  Neurological: She is alert and oriented to person, place, and time. She has normal reflexes.  Skin: Skin is warm and dry.  Psychiatric: She has a normal mood and affect. Her behavior is normal. Judgment and thought content normal.    BP (!) 168/68   Pulse 66   Temp 98.2 F (36.8 C) (Oral)   Ht 5' (1.524 m)   Wt 124 lb (56.2 kg)   BMI 24.22 kg/m   hgba1c 6.6%    Assessment & Plan:  1. Essential hypertension Low sodium diet  2. Hyperlipidemia, unspecified hyperlipidemia type Low fat diet - Lipid panel  3. Type 2 diabetes mellitus treated with insulin (HCC) Continue to watch carbs n  diet - Bayer DCA Hb A1c Waived  4. Late effect of cerebrovascular accident (CVA) Fall orevention  5. Gastroesophageal reflux disease without esophagitis Avoid spicy foods Do not eat 2 hours prior to bedtime  6. Chronic kidney disease (CKD) stage G3a/A3, moderately decreased glomerular filtration rate (GFR) between 45-59 mL/min/1.73 square meter and albuminuria creatinine ratio greater than 300 mg/g (HCC) Labs pending  7. Recurrent major depressive disorder, in full remission (HCC) Added celexa 20 mg today  8. GAD (generalized anxiety disorder)  stres management  9. Peripheral edema elevate legs when sitting  Meds ordered this encounter  Medications  . citalopram (CELEXA) 20 MG tablet    Sig: Take 1 tablet (20 mg total) by mouth daily.  Dispense:  30 tablet    Refill:  5    Order Specific Question:   Supervising Provider    Answer:   VINCENT, CAROL L [[id number]]  . LORazepam (ATIVAN) 0.5 MG tablet    Sig: Take 1 tablet (0.5 mg total) by mouth 2 (two) times daily.    Dispense:  60 tablet    Refill:  3    Order Specific Question:   Supervising Provider    Answer:   Flowella pending Health maintenance reviewed Diet and exercise encouraged Continue all meds Follow up  In 3 months  Sharkey, FNP

## 2017-10-07 NOTE — Patient Instructions (Addendum)

## 2017-10-08 LAB — LIPID PANEL
CHOLESTEROL TOTAL: 112 mg/dL (ref 100–199)
Chol/HDL Ratio: 2.5 ratio (ref 0.0–4.4)
HDL: 44 mg/dL (ref >39)
LDL CALC: 40 mg/dL (ref 0–99)
Triglycerides: 142 mg/dL (ref 0–149)
VLDL CHOLESTEROL CAL: 28 mg/dL (ref 5–40)

## 2017-10-09 MED ORDER — PHENYLEPHRINE HCL 2.5 % OP SOLN
OPHTHALMIC | Status: AC
  Filled 2017-10-09: qty 15

## 2017-10-09 MED ORDER — LIDOCAINE HCL (PF) 1 % IJ SOLN
INTRAMUSCULAR | Status: AC
  Filled 2017-10-09: qty 2

## 2017-10-09 MED ORDER — NEOMYCIN-POLYMYXIN-DEXAMETH 3.5-10000-0.1 OP SUSP
OPHTHALMIC | Status: AC
  Filled 2017-10-09: qty 5

## 2017-10-09 MED ORDER — CYCLOPENTOLATE-PHENYLEPHRINE 0.2-1 % OP SOLN
OPHTHALMIC | Status: AC
  Filled 2017-10-09: qty 2

## 2017-10-09 MED ORDER — TETRACAINE HCL 0.5 % OP SOLN
OPHTHALMIC | Status: AC
  Filled 2017-10-09: qty 4

## 2017-10-09 MED ORDER — LIDOCAINE HCL 3.5 % OP GEL
OPHTHALMIC | Status: AC
  Filled 2017-10-09: qty 1

## 2017-10-10 ENCOUNTER — Ambulatory Visit (HOSPITAL_COMMUNITY): Payer: Medicare Other | Admitting: Anesthesiology

## 2017-10-10 ENCOUNTER — Encounter (HOSPITAL_COMMUNITY)
Admission: RE | Disposition: A | Discharge status: Home / Self Care | Payer: Self-pay | Source: Ambulatory Visit | Attending: Ophthalmology

## 2017-10-10 ENCOUNTER — Encounter (HOSPITAL_COMMUNITY): Payer: Self-pay | Admitting: *Deleted

## 2017-10-10 ENCOUNTER — Ambulatory Visit (HOSPITAL_COMMUNITY)
Admission: RE | Admit: 2017-10-10 | Discharge: 2017-10-10 | Disposition: A | Discharge status: Home / Self Care | Payer: Medicare Other | Source: Ambulatory Visit | Attending: Ophthalmology | Admitting: Ophthalmology

## 2017-10-10 DIAGNOSIS — M1990 Unspecified osteoarthritis, unspecified site: Secondary | ICD-10-CM | POA: Insufficient documentation

## 2017-10-10 DIAGNOSIS — I10 Essential (primary) hypertension: Secondary | ICD-10-CM | POA: Diagnosis not present

## 2017-10-10 DIAGNOSIS — Z79899 Other long term (current) drug therapy: Secondary | ICD-10-CM | POA: Insufficient documentation

## 2017-10-10 DIAGNOSIS — K219 Gastro-esophageal reflux disease without esophagitis: Secondary | ICD-10-CM | POA: Diagnosis not present

## 2017-10-10 DIAGNOSIS — E119 Type 2 diabetes mellitus without complications: Secondary | ICD-10-CM | POA: Diagnosis not present

## 2017-10-10 DIAGNOSIS — H269 Unspecified cataract: Secondary | ICD-10-CM | POA: Diagnosis not present

## 2017-10-10 DIAGNOSIS — F418 Other specified anxiety disorders: Secondary | ICD-10-CM | POA: Diagnosis not present

## 2017-10-10 DIAGNOSIS — H25811 Combined forms of age-related cataract, right eye: Secondary | ICD-10-CM | POA: Diagnosis not present

## 2017-10-10 DIAGNOSIS — H2511 Age-related nuclear cataract, right eye: Secondary | ICD-10-CM | POA: Diagnosis not present

## 2017-10-10 HISTORY — PX: CATARACT EXTRACTION W/PHACO: SHX586

## 2017-10-10 LAB — GLUCOSE, CAPILLARY: GLUCOSE-CAPILLARY: 101 mg/dL — AB (ref 65–99)

## 2017-10-10 SURGERY — PHACOEMULSIFICATION, CATARACT, WITH IOL INSERTION
Anesthesia: Monitor Anesthesia Care | Site: Eye | Laterality: Right

## 2017-10-10 MED ORDER — MIDAZOLAM HCL 2 MG/2ML IJ SOLN
1.0000 mg | Freq: Once | INTRAMUSCULAR | Status: AC | PRN
  Administered 2017-10-10: 1 mg via INTRAVENOUS

## 2017-10-10 MED ORDER — LACTATED RINGERS IV SOLN
INTRAVENOUS | Status: DC
  Administered 2017-10-10: 1000 mL via INTRAVENOUS

## 2017-10-10 MED ORDER — LIDOCAINE HCL (PF) 1 % IJ SOLN
INTRAOCULAR | Status: DC | PRN
  Administered 2017-10-10: 1 mL via OPHTHALMIC

## 2017-10-10 MED ORDER — PHENYLEPHRINE HCL 2.5 % OP SOLN
1.0000 [drp] | OPHTHALMIC | Status: AC
  Administered 2017-10-10 (×3): 1 [drp] via OPHTHALMIC

## 2017-10-10 MED ORDER — PROVISC 10 MG/ML IO SOLN
INTRAOCULAR | Status: DC | PRN
  Administered 2017-10-10: 0.85 mL via INTRAOCULAR

## 2017-10-10 MED ORDER — NEOMYCIN-POLYMYXIN-DEXAMETH 3.5-10000-0.1 OP SUSP
OPHTHALMIC | Status: DC | PRN
  Administered 2017-10-10: 2 [drp] via OPHTHALMIC

## 2017-10-10 MED ORDER — ONDANSETRON 4 MG PO TBDP
ORAL_TABLET | ORAL | Status: AC
  Filled 2017-10-10: qty 1

## 2017-10-10 MED ORDER — BSS IO SOLN
INTRAOCULAR | Status: DC | PRN
  Administered 2017-10-10: 15 mL via INTRAOCULAR

## 2017-10-10 MED ORDER — POVIDONE-IODINE 5 % OP SOLN
OPHTHALMIC | Status: DC | PRN
  Administered 2017-10-10: 1 via OPHTHALMIC

## 2017-10-10 MED ORDER — EPINEPHRINE PF 1 MG/ML IJ SOLN
INTRAOCULAR | Status: DC | PRN
  Administered 2017-10-10: 500 mL

## 2017-10-10 MED ORDER — ONDANSETRON 4 MG PO TBDP
4.0000 mg | ORAL_TABLET | Freq: Once | ORAL | Status: AC
  Administered 2017-10-10: 4 mg via ORAL

## 2017-10-10 MED ORDER — CYCLOPENTOLATE-PHENYLEPHRINE 0.2-1 % OP SOLN
1.0000 [drp] | OPHTHALMIC | Status: AC
  Administered 2017-10-10 (×3): 1 [drp] via OPHTHALMIC

## 2017-10-10 MED ORDER — LIDOCAINE HCL 3.5 % OP GEL
1.0000 "application " | Freq: Once | OPHTHALMIC | Status: AC
  Administered 2017-10-10: 1 via OPHTHALMIC

## 2017-10-10 MED ORDER — MIDAZOLAM HCL 2 MG/2ML IJ SOLN
INTRAMUSCULAR | Status: AC
  Filled 2017-10-10: qty 2

## 2017-10-10 MED ORDER — TETRACAINE HCL 0.5 % OP SOLN
1.0000 [drp] | OPHTHALMIC | Status: AC
  Administered 2017-10-10 (×3): 1 [drp] via OPHTHALMIC

## 2017-10-10 MED ORDER — SODIUM HYALURONATE 23 MG/ML IO SOLN
INTRAOCULAR | Status: DC | PRN
  Administered 2017-10-10: 0.6 mL via INTRAOCULAR

## 2017-10-10 SURGICAL SUPPLY — 15 items
CLOTH BEACON ORANGE TIMEOUT ST (SAFETY) ×3 IMPLANT
EYE SHIELD UNIVERSAL CLEAR (GAUZE/BANDAGES/DRESSINGS) ×3 IMPLANT
GLOVE BIOGEL PI IND STRL 6.5 (GLOVE) ×1 IMPLANT
GLOVE BIOGEL PI IND STRL 7.0 (GLOVE) ×1 IMPLANT
GLOVE BIOGEL PI IND STRL 7.5 (GLOVE) ×1 IMPLANT
GLOVE BIOGEL PI INDICATOR 6.5 (GLOVE) ×2
GLOVE BIOGEL PI INDICATOR 7.0 (GLOVE) ×2
GLOVE BIOGEL PI INDICATOR 7.5 (GLOVE) ×2
LENS ALC ACRYL/TECN (Ophthalmic Related) ×3 IMPLANT
NEEDLE HYPO 18GX1.5 BLUNT FILL (NEEDLE) ×3 IMPLANT
PAD ARMBOARD 7.5X6 YLW CONV (MISCELLANEOUS) ×3 IMPLANT
SYR TB 1ML LL NO SAFETY (SYRINGE) ×3 IMPLANT
TAPE PAPER MEDFIX 1IN X 10YD (GAUZE/BANDAGES/DRESSINGS) ×3 IMPLANT
VISCOELASTIC ADDITIONAL (OPHTHALMIC RELATED) ×3 IMPLANT
WATER STERILE IRR 250ML POUR (IV SOLUTION) ×3 IMPLANT

## 2017-10-10 NOTE — Discharge Instructions (Signed)
Please discharge patient when stable, will follow up today with Dr. Marisa Hua at the [facility] office immediately following discharge.  Leave shield in place until visit.  All paperwork with discharge instructions will be given at the office.   PATIENT INSTRUCTIONS POST-ANESTHESIA  IMMEDIATELY FOLLOWING SURGERY:  Do not drive or operate machinery for the first twenty four hours after surgery.  Do not make any important decisions for twenty four hours after surgery or while taking narcotic pain medications or sedatives.  If you develop intractable nausea and vomiting or a severe headache please notify your doctor immediately.  FOLLOW-UP:  Please make an appointment with your surgeon as instructed. You do not need to follow up with anesthesia unless specifically instructed to do so.  WOUND CARE INSTRUCTIONS (if applicable):  Keep a dry clean dressing on the anesthesia/puncture wound site if there is drainage.  Once the wound has quit draining you may leave it open to air.  Generally you should leave the bandage intact for twenty four hours unless there is drainage.  If the epidural site drains for more than 36-48 hours please call the anesthesia department.  QUESTIONS?:  Please feel free to call your physician or the hospital operator if you have any questions, and they will be happy to assist you.

## 2017-10-10 NOTE — Anesthesia Postprocedure Evaluation (Signed)
Anesthesia Post Note  Patient: Kara Dean  Procedure(s) Performed: CATARACT EXTRACTION PHACO AND INTRAOCULAR LENS PLACEMENT RIGHT EYE (Right Eye)  Patient location during evaluation: Short Stay Anesthesia Type: MAC Level of consciousness: awake and alert, oriented and patient cooperative Pain management: pain level controlled Vital Signs Assessment: post-procedure vital signs reviewed and stable Respiratory status: spontaneous breathing Cardiovascular status: stable Postop Assessment: no apparent nausea or vomiting Anesthetic complications: no     Last Vitals:  Vitals:   10/10/17 0840 10/10/17 0845  BP: (!) 147/55   Pulse:    Resp: 16 16  Temp:    SpO2: 100% 100%    Last Pain:  Vitals:   10/10/17 0719  PainSc: 0-No pain                 ADAMS, AMY A

## 2017-10-10 NOTE — Anesthesia Preprocedure Evaluation (Signed)
Anesthesia Evaluation  Patient identified by MRN, date of birth, ID band Patient awake    Airway Mallampati: I       Dental  (+) Edentulous Upper, Edentulous Lower   Pulmonary    Pulmonary exam normal        Cardiovascular Exercise Tolerance: Poor hypertension, Pt. on medications and Pt. on home beta blockers (-) anginaNormal cardiovascular exam Rhythm:Regular Rate:Normal  Sinus bradycardia Incomplete right bundle branch block Left anterior fascicular block Left ventricular hypertrophy Cannot rule out Septal infarct , age undetermined Abnormal ECG   (2018)    Neuro/Psych Anxiety Depression    GI/Hepatic GERD  Medicated,  Endo/Other  diabetes  Renal/GU Renal disease     Musculoskeletal  (+) Arthritis ,   Abdominal Normal abdominal exam  (+)   Peds  Hematology Results for Kara Dean, Kara Dean (MRN [id number]) as of 10/10/2017 07:49  09/16/2017 13:14 WBC: 7.9 RBC: 4.21 Hemoglobin: 12.8 HCT: 38.7 MCV: 91.9 MCH: 30.4 MCHC: 33.1 RDW: 12.8 Platelets: 211    Anesthesia Other Findings   Reproductive/Obstetrics                             Anesthesia Physical Anesthesia Plan  ASA: II  Anesthesia Plan: MAC   Post-op Pain Management:    Induction:   PONV Risk Score and Plan:   Airway Management Planned: Nasal Cannula  Additional Equipment:   Intra-op Plan:   Post-operative Plan:   Informed Consent: I have reviewed the patients History and Physical, chart, labs and discussed the procedure including the risks, benefits and alternatives for the proposed anesthesia with the patient or authorized representative who has indicated his/her understanding and acceptance.     Plan Discussed with: CRNA  Anesthesia Plan Comments:         Anesthesia Quick Evaluation

## 2017-10-10 NOTE — Anesthesia Procedure Notes (Signed)
Procedure Name: MAC Date/Time: 10/10/2017 8:45 AM Performed by: Andree Elk Gayle Martinez A, CRNA Pre-anesthesia Checklist: Patient identified, Timeout performed, Emergency Drugs available, Suction available and Patient being monitored Oxygen Delivery Method: Nasal cannula

## 2017-10-10 NOTE — Op Note (Signed)
Date of procedure: 10/10/17  Pre-operative diagnosis: Visually significant cataract, Right Eye  Post-operative diagnosis: Visually significant cataract, Right Eye  Procedure: Removal of cataract via phacoemulsification and insertion of intra-ocular lens AMO PCB00  +23.0D into the capsular bag of the Right Eye  Attending surgeon: Gerda Diss. Renesmay Nesbitt, MD, MA  Anesthesia: MAC, Topical Akten  Complications: None  Estimated Blood Loss: <75m (minimal)  Specimens: None  Implants: As above  Indications:  Visually significant cataract, Right Eye  Procedure:  The patient was seen and identified in the pre-operative area. The operative eye was identified and dilated.  The operative eye was marked.  Topical anesthesia was administered to the operative eye.     The patient was then to the operative suite and placed in the supine position.  A timeout was performed confirming the patient, procedure to be performed, and all other relevant information.   The patient's face was prepped and draped in the usual fashion for intra-ocular surgery.  A lid speculum was placed into the operative eye and the surgical microscope moved into place and focused.  A superotemporal paracentesis was created using a 20 gauge paracentesis blade.  Shugarcaine was injected into the anterior chamber.  Viscoelastic was injected into the anterior chamber.  A temporal clear-corneal main wound incision was created using a 2.476mmicrokeratome.  A continuous curvilinear capsulorrhexis was initiated using an irrigating cystitome and completed using capsulorrhexis forceps.  Hydrodissection and hydrodeliniation were performed.  Viscoelastic was injected into the anterior chamber.  A phacoemulsification handpiece and a chopper as a second instrument were used to remove the nucleus and epinucleus. The irrigation/aspiration handpiece was used to remove any remaining cortical material.   The capsular bag was reinflated with viscoelastic,  checked, and found to be intact.  The intraocular lens was inserted into the capsular bag and dialed into place using a Kuglen hook.  The irrigation/aspiration handpiece was used to remove any remaining viscoelastic.  The clear corneal wound and paracentesis wounds were then hydrated and checked with Weck-Cels to be watertight.  The lid-speculum and drape was removed, and the patient's face was cleaned with a wet and dry 4x4.  Maxitrol was instilled in the eye before a clear shield was taped over the eye. The patient was taken to the post-operative care unit in good condition, having tolerated the procedure well.  Post-Op Instructions: [facility] and will receive all other orders and instructions.

## 2017-10-10 NOTE — Transfer of Care (Signed)
Immediate Anesthesia Transfer of Care Note  Patient: Kara Dean  Procedure(s) Performed: CATARACT EXTRACTION PHACO AND INTRAOCULAR LENS PLACEMENT RIGHT EYE (Right Eye)  Patient Location: Short Stay  Anesthesia Type:MAC  Level of Consciousness: awake, alert , oriented and patient cooperative  Airway & Oxygen Therapy: Patient Spontanous Breathing  Post-op Assessment: Report given to RN and Post -op Vital signs reviewed and stable  Post vital signs: Reviewed and stable  Last Vitals:  Vitals:   10/10/17 0840 10/10/17 0845  BP: (!) 147/55   Pulse:    Resp: 16 16  Temp:    SpO2: 100% 100%    Last Pain:  Vitals:   10/10/17 0719  PainSc: 0-No pain      Patients Stated Pain Goal: 8 (01/14/80 1886)  Complications: No apparent anesthesia complications

## 2017-10-10 NOTE — H&P (Signed)
The H and P was reviewed and updated. The patient was examined.  No changes were found after exam.  The surgical eye was marked.

## 2017-10-14 ENCOUNTER — Encounter (HOSPITAL_COMMUNITY): Payer: Self-pay | Admitting: Ophthalmology

## 2017-10-17 ENCOUNTER — Other Ambulatory Visit: Payer: Self-pay | Admitting: Nurse Practitioner

## 2017-11-13 DIAGNOSIS — M79676 Pain in unspecified toe(s): Secondary | ICD-10-CM | POA: Diagnosis not present

## 2017-11-13 DIAGNOSIS — L84 Corns and callosities: Secondary | ICD-10-CM | POA: Diagnosis not present

## 2017-11-13 DIAGNOSIS — E1142 Type 2 diabetes mellitus with diabetic polyneuropathy: Secondary | ICD-10-CM | POA: Diagnosis not present

## 2017-11-13 DIAGNOSIS — B351 Tinea unguium: Secondary | ICD-10-CM | POA: Diagnosis not present

## 2017-11-22 ENCOUNTER — Other Ambulatory Visit: Payer: Self-pay | Admitting: Nurse Practitioner

## 2017-11-22 DIAGNOSIS — R609 Edema, unspecified: Secondary | ICD-10-CM

## 2017-12-17 ENCOUNTER — Other Ambulatory Visit: Payer: Self-pay | Admitting: Nurse Practitioner

## 2017-12-23 ENCOUNTER — Other Ambulatory Visit: Payer: Self-pay | Admitting: Nurse Practitioner

## 2017-12-23 DIAGNOSIS — I10 Essential (primary) hypertension: Secondary | ICD-10-CM

## 2018-01-08 ENCOUNTER — Encounter: Payer: Self-pay | Admitting: Family Medicine

## 2018-01-08 ENCOUNTER — Ambulatory Visit (INDEPENDENT_AMBULATORY_CARE_PROVIDER_SITE_OTHER): Payer: Medicare Other | Admitting: Family Medicine

## 2018-01-08 VITALS — BP 150/69 | HR 61 | Temp 98.3°F | Ht 60.0 in | Wt 125.4 lb

## 2018-01-08 DIAGNOSIS — K529 Noninfective gastroenteritis and colitis, unspecified: Secondary | ICD-10-CM

## 2018-01-08 DIAGNOSIS — R110 Nausea: Secondary | ICD-10-CM | POA: Diagnosis not present

## 2018-01-08 MED ORDER — ONDANSETRON 4 MG PO TBDP
4.0000 mg | ORAL_TABLET | Freq: Once | ORAL | Status: AC
  Administered 2018-01-08: 4 mg via ORAL

## 2018-01-08 MED ORDER — ONDANSETRON 4 MG PO TBDP: 4.0000 mg | ORAL_TABLET | Freq: Three times a day (TID) | ORAL | 0 refills | Status: DC | PRN

## 2018-01-08 NOTE — Patient Instructions (Signed)
You have been prescribed Zofran for nausea.  You may take this every 8 hours if needed.  As we discussed, this can cause you be sleepy.  Make sure that you are drinking plenty of fluids.  If you start feeling like you are unable to stay hydrated, develop severe fatigue, have high fevers, severe abdominal pain, worsening diarrhea or vomiting please seek immediate medical attention in the emergency department.   Food Choices to Help Relieve Diarrhea, Adult When you have diarrhea, the foods you eat and your eating habits are very important. Choosing the right foods and drinks can help:  Relieve diarrhea.  Replace lost fluids and nutrients.  Prevent dehydration.  What general guidelines should I follow? Relieving diarrhea  Choose foods with less than 2 g or .07 oz. of fiber per serving.  Limit fats to less than 8 tsp (38 g or 1.34 oz.) a day.  Avoid the following: ? Foods and beverages sweetened with high-fructose corn syrup, honey, or sugar alcohols such as xylitol, sorbitol, and mannitol. ? Foods that contain a lot of fat or sugar. ? Fried, greasy, or spicy foods. ? High-fiber grains, breads, and cereals. ? Raw fruits and vegetables.  Eat foods that are rich in probiotics. These foods include dairy products such as yogurt and fermented milk products. They help increase healthy bacteria in the stomach and intestines (gastrointestinal tract, or GI tract).  If you have lactose intolerance, avoid dairy products. These may make your diarrhea worse.  Take medicine to help stop diarrhea (antidiarrheal medicine) only as told by your health care provider. Replacing nutrients  Eat small meals or snacks every 3-4 hours.  Eat bland foods, such as white rice, toast, or baked potato, until your diarrhea starts to get better. Gradually reintroduce nutrient-rich foods as tolerated or as told by your health care provider. This includes: ? Well-cooked protein foods. ? Peeled, seeded, and soft-cooked  fruits and vegetables. ? Low-fat dairy products.  Take vitamin and mineral supplements as told by your health care provider. Preventing dehydration   Start by sipping water or a special solution to prevent dehydration (oral rehydration solution, ORS). Urine that is clear or pale yellow means that you are getting enough fluid.  Try to drink at least 8-10 cups of fluid each day to help replace lost fluids.  You may add other liquids in addition to water, such as clear juice or decaffeinated sports drinks, as tolerated or as told by your health care provider.  Avoid drinks with caffeine, such as coffee, tea, or soft drinks.  Avoid alcohol. What foods are recommended? The items listed may not be a complete list. Talk with your health care provider about what dietary choices are best for you. Grains White rice. White, [place], or pita breads (fresh or toasted), including plain rolls, buns, or bagels. White pasta. Saltine, soda, or graham crackers. Pretzels. Low-fiber cereal. Cooked cereals made with water (such as cornmeal, farina, or cream cereals). Plain muffins. Matzo. Melba toast. Zwieback. Vegetables Potatoes (without the skin). Most well-cooked and canned vegetables without skins or seeds. Tender lettuce. Fruits Apple sauce. Fruits canned in juice. Cooked apricots, cherries, grapefruit, peaches, pears, or plums. Fresh bananas and cantaloupe. Meats and other protein foods Baked or boiled chicken. Eggs. Tofu. Fish. Seafood. Smooth nut butters. Ground or well-cooked tender beef, ham, veal, lamb, pork, or poultry. Dairy Plain yogurt, kefir, and unsweetened liquid yogurt. Lactose-free milk, buttermilk, skim milk, or soy milk. Low-fat or nonfat hard cheese. Beverages Water. Low-calorie sports drinks.  Fruit juices without pulp. Strained tomato and vegetable juices. Decaffeinated teas. Sugar-free beverages not sweetened with sugar alcohols. Oral rehydration solutions, if approved by your health  care provider. Seasoning and other foods Bouillon, broth, or soups made from recommended foods. What foods are not recommended? The items listed may not be a complete list. Talk with your health care provider about what dietary choices are best for you. Grains Whole grain, whole wheat, bran, or rye breads, rolls, pastas, and crackers. Wild or brown rice. Whole grain or bran cereals. Barley. Oats and oatmeal. Corn tortillas or taco shells. Granola. Popcorn. Vegetables Raw vegetables. Fried vegetables. Cabbage, broccoli, Brussels sprouts, artichokes, baked beans, beet greens, corn, kale, legumes, peas, sweet potatoes, and yams. Potato skins. Cooked spinach and cabbage. Fruits Dried fruit, including raisins and dates. Raw fruits. Stewed or dried prunes. Canned fruits with syrup. Meat and other protein foods Fried or fatty meats. Deli meats. Chunky nut butters. Nuts and seeds. Beans and lentils. Berniece Salines. Hot dogs. Sausage. Dairy High-fat cheeses. Whole milk, chocolate milk, and beverages made with milk, such as milk shakes. Half-and-half. Cream. sour cream. Ice cream. Beverages Caffeinated beverages (such as coffee, tea, soda, or energy drinks). Alcoholic beverages. Fruit juices with pulp. Prune juice. Soft drinks sweetened with high-fructose corn syrup or sugar alcohols. High-calorie sports drinks. Fats and oils Butter. Cream sauces. Margarine. Salad oils. Plain salad dressings. Olives. Avocados. Mayonnaise. Sweets and desserts Sweet rolls, doughnuts, and sweet breads. Sugar-free desserts sweetened with sugar alcohols such as xylitol and sorbitol. Seasoning and other foods Honey. Hot sauce. Chili powder. Gravy. Cream-based or milk-based soups. Pancakes and waffles. Summary  When you have diarrhea, the foods you eat and your eating habits are very important.  Make sure you get at least 8-10 cups of fluid each day, or enough to keep your urine clear or pale yellow.  Eat bland foods and  gradually reintroduce healthy, nutrient-rich foods as tolerated, or as told by your health care provider.  Avoid high-fiber, fried, greasy, or spicy foods. This information is not intended to replace advice given to you by your health care provider. Make sure you discuss any questions you have with your health care provider. Document Released: 01/11/2004 Document Revised: 10/18/2016 Document Reviewed: 10/18/2016 Elsevier Interactive Patient Education  [employer].

## 2018-01-08 NOTE — Progress Notes (Signed)
Subjective: CC: Nausea PCP: [name], FNP TMH:DQQI L Monforte is a 84 y.o. female presenting to clinic today for:  1. Nausea Patient reports that she has been having nausea without vomiting for 3 days.  She notes that nausea seems slightly worse today.  She reports associated diarrhea that is nonbloody.  She reports 4 stools today.  Denies undercooked foods or foods left out too long.  There have been several family members with similar GI illnesses.  She denies tachycardia but does note some fatigue.  She also notes that she feels anxious that she may be becoming dehydrated.  Blood sugar this morning was 120s.  She notes difficulty drinking water because of nausea but has not had any vomiting.  Denies abdominal pain.   ROS: Per HPI  Allergies  Allergen Reactions  . Codeine Other (See Comments)    unknown  . Latex Rash  . Morphine Other (See Comments)    unknown  . Niacin Other (See Comments)    unknown  . Sulfa Antibiotics Rash  . Sulfonamide Derivatives Rash   Past Medical History:  Diagnosis Date  . Anxiety   . Carotid bruit   . Cataract   . Crohn's colitis ([place])   . CVA (cerebral infarction)    No deficits  . Depression   . Diabetes mellitus    type 2  . Dyslipidemia   . GERD (gastroesophageal reflux disease)   . History of TIAs    no deficits  . Hyperlipidemia   . Hypertension   . Hyponatremia   . Osteoarthritis     Current Outpatient Medications:  .  amLODipine (NORVASC) 10 MG tablet, Take 1 tablet (10 mg total) by mouth daily., Disp: 90 tablet, Rfl: 1 .  atorvastatin (LIPITOR) 10 MG tablet, Take 1 tablet (10 mg total) by mouth daily., Disp: 90 tablet, Rfl: 1 .  B Complex Vitamins (B COMPLEX-B12) TABS, Take 1 tablet by mouth daily.  , Disp: , Rfl:  .  Cholecalciferol (VITAMIN D3 PO), Take 1 capsule daily by mouth., Disp: , Rfl:  .  citalopram (CELEXA) 20 MG tablet, Take 1 tablet (20 mg total) by mouth daily., Disp: 30 tablet, Rfl: 5 .   dipyridamole-aspirin (AGGRENOX) 200-25 MG 12hr capsule, Take 1 capsule by mouth 2 (two) times daily., Disp: 2701 capsule, Rfl: 1 .  fluticasone (FLONASE) 50 MCG/ACT nasal spray, USE 2 SPRAYS IN EACH NOSTRIL ONCE DAILY., Disp: 16 g, Rfl: 4 .  furosemide (LASIX) 20 MG tablet, TAKE 1 TABLET DAILY, Disp: 30 tablet, Rfl: 4 .  glipiZIDE (GLIPIZIDE XL) 10 MG 24 hr tablet, TAKE (1) TABLET DAILY WITH BREAKFAST. (Patient taking differently: Take 10 mg daily with breakfast by mouth. ), Disp: 90 tablet, Rfl: 1 .  glucose blood (ONE TOUCH ULTRA TEST) test strip, CHECK BLOOD SUGER UP TO 3 TIMES A DAY, Disp: 300 each, Rfl: 3 .  ibuprofen (ADVIL,MOTRIN) 200 MG tablet, Take 400 mg every 6 (six) hours as needed by mouth for headache or moderate pain., Disp: , Rfl:  .  insulin glargine (LANTUS) 100 UNIT/ML injection, Inject 0.38 mLs (38 Units total) into the skin every morning., Disp: 30 mL, Rfl: 5 .  LORazepam (ATIVAN) 0.5 MG tablet, Take 1 tablet (0.5 mg total) by mouth 2 (two) times daily., Disp: 60 tablet, Rfl: 3 .  Magnesium 250 MG TABS, Take 250 mg daily by mouth. , Disp: , Rfl:  .  metoprolol succinate (TOPROL-XL) 100 MG 24 hr tablet, Take 1 tablet (100  mg total) by mouth daily. Take with or immediately following a meal., Disp: 90 tablet, Rfl: 1 .  olmesartan (BENICAR) 40 MG tablet, TAKE 1 TABLET DAILY, Disp: 30 tablet, Rfl: 5 .  omega-3 fish oil (MAXEPA) 1000 MG CAPS capsule, Take 1 capsule by mouth daily.  , Disp: , Rfl:  .  pantoprazole (PROTONIX) 40 MG tablet, Take 1 tablet (40 mg total) by mouth daily., Disp: 90 tablet, Rfl: 1 .  potassium chloride (K-DUR) 10 MEQ tablet, Take 1 tablet (10 mEq total) by mouth daily., Disp: 90 tablet, Rfl: 1  Social History   Socioeconomic History  . Marital status: Widowed    Spouse name: Not on file  . Number of children: Not on file  . Years of education: Not on file  . Highest education level: Not on file  Social Needs  . Financial resource strain: Not on file  .  Food insecurity - worry: Not on file  . Food insecurity - inability: Not on file  . Transportation needs - medical: Not on file  . Transportation needs - non-medical: Not on file  Occupational History  . Not on file  Tobacco Use  . Smoking status: Never Smoker  . Smokeless tobacco: Never Used  Substance and Sexual Activity  . Alcohol use: No  . Drug use: No  . Sexual activity: No  Other Topics Concern  . Not on file  Social History Narrative  . Not on file   Family History  Problem Relation Age of Onset  . Coronary artery disease Sister        stent x 3  . Seizures Sister   . Coronary artery disease Brother        stent  . Hypertension Mother   . Stroke Mother   . Emphysema Father   . Alcohol abuse Brother   . Lung disease Brother     Objective: Office vital signs reviewed. BP (!) 150/69   Pulse 61   Temp 98.3 F (36.8 C) (Oral)   Ht 5' (1.524 m)   Wt 125 lb 6 oz (56.9 kg)   BMI 24.49 kg/m   Physical Examination:  General: Awake, alert, thin, elderly female, No acute distress HEENT: Normal    Throat: moist mucus membranes, no erythema Cardio: regular rate and rhythm, S1S2 heard, no murmurs appreciated Pulm: clear to auscultation bilaterally, no wheezes, rhonchi or rales; normal work of breathing on room air GI: soft, non-tender, non-distended, bowel sounds present x4, no hepatomegaly, no splenomegaly, no masses Skin: crepey, dry  Assessment/ Plan: 84 y.o. female   1. Nausea I suspect secondary to viral gastroenteritis.  No known exposure to bacterial pathogens.  She is afebrile and nontoxic-appearing.  No evidence of significant dehydration on exam or within vital signs.  Mucous membranes were moist.  She does have crepey skin but I suspect this is secondary to age.  She was given a dose of Zofran ODT here in office and fluid challenge.  She was able to drink fluids without difficulty.  She notes that nausea had significantly subsided with Zofran.  Encouraged  that she drink at least 4-6 fluid ounces per hour.  If for any reason unable to tolerate p.o. intake, I did recommend that she be evaluated in the emergency department as she may need IV hydration.  Follow-up as needed. - ondansetron (ZOFRAN-ODT) disintegrating tablet 4 mg  2. Gastroenteritis No evidence of acute abdomen or findings suggestive of diverticulitis.  Suspect that she has a viral  gastroenteritis.   Meds ordered this encounter  Medications  . ondansetron (ZOFRAN-ODT) disintegrating tablet 4 mg  . ondansetron (ZOFRAN ODT) 4 MG disintegrating tablet    Sig: Take 1 tablet (4 mg total) by mouth every 8 (eight) hours as needed for nausea or vomiting.    Dispense:  20 tablet    Refill:  [place], DO [place] [phone]

## 2018-01-09 ENCOUNTER — Observation Stay (HOSPITAL_COMMUNITY)
Admission: EM | Admit: 2018-01-09 | Discharge: 2018-01-11 | Disposition: A | Discharge status: Home / Self Care | Payer: Medicare Other | Attending: Family Medicine | Admitting: Family Medicine

## 2018-01-09 ENCOUNTER — Other Ambulatory Visit: Payer: Self-pay

## 2018-01-09 ENCOUNTER — Emergency Department (HOSPITAL_COMMUNITY): Payer: Medicare Other

## 2018-01-09 ENCOUNTER — Encounter (HOSPITAL_COMMUNITY): Payer: Self-pay | Admitting: Emergency Medicine

## 2018-01-09 DIAGNOSIS — E785 Hyperlipidemia, unspecified: Secondary | ICD-10-CM | POA: Diagnosis not present

## 2018-01-09 DIAGNOSIS — Z7982 Long term (current) use of aspirin: Secondary | ICD-10-CM | POA: Diagnosis not present

## 2018-01-09 DIAGNOSIS — K5010 Crohn's disease of large intestine without complications: Secondary | ICD-10-CM | POA: Insufficient documentation

## 2018-01-09 DIAGNOSIS — Z8673 Personal history of transient ischemic attack (TIA), and cerebral infarction without residual deficits: Secondary | ICD-10-CM | POA: Diagnosis not present

## 2018-01-09 DIAGNOSIS — E1142 Type 2 diabetes mellitus with diabetic polyneuropathy: Secondary | ICD-10-CM

## 2018-01-09 DIAGNOSIS — N3000 Acute cystitis without hematuria: Secondary | ICD-10-CM | POA: Diagnosis not present

## 2018-01-09 DIAGNOSIS — R079 Chest pain, unspecified: Secondary | ICD-10-CM | POA: Diagnosis not present

## 2018-01-09 DIAGNOSIS — F329 Major depressive disorder, single episode, unspecified: Secondary | ICD-10-CM | POA: Insufficient documentation

## 2018-01-09 DIAGNOSIS — R1084 Generalized abdominal pain: Secondary | ICD-10-CM | POA: Diagnosis not present

## 2018-01-09 DIAGNOSIS — I10 Essential (primary) hypertension: Secondary | ICD-10-CM | POA: Insufficient documentation

## 2018-01-09 DIAGNOSIS — I700 Atherosclerosis of aorta: Secondary | ICD-10-CM | POA: Insufficient documentation

## 2018-01-09 DIAGNOSIS — K219 Gastro-esophageal reflux disease without esophagitis: Secondary | ICD-10-CM | POA: Insufficient documentation

## 2018-01-09 DIAGNOSIS — E11649 Type 2 diabetes mellitus with hypoglycemia without coma: Secondary | ICD-10-CM | POA: Diagnosis not present

## 2018-01-09 DIAGNOSIS — Z882 Allergy status to sulfonamides status: Secondary | ICD-10-CM | POA: Insufficient documentation

## 2018-01-09 DIAGNOSIS — E1169 Type 2 diabetes mellitus with other specified complication: Secondary | ICD-10-CM | POA: Diagnosis present

## 2018-01-09 DIAGNOSIS — R200 Anesthesia of skin: Secondary | ICD-10-CM | POA: Diagnosis not present

## 2018-01-09 DIAGNOSIS — F419 Anxiety disorder, unspecified: Secondary | ICD-10-CM | POA: Insufficient documentation

## 2018-01-09 DIAGNOSIS — Z794 Long term (current) use of insulin: Secondary | ICD-10-CM | POA: Diagnosis not present

## 2018-01-09 DIAGNOSIS — Z79899 Other long term (current) drug therapy: Secondary | ICD-10-CM | POA: Diagnosis not present

## 2018-01-09 DIAGNOSIS — R630 Anorexia: Secondary | ICD-10-CM | POA: Insufficient documentation

## 2018-01-09 DIAGNOSIS — Z885 Allergy status to narcotic agent status: Secondary | ICD-10-CM | POA: Insufficient documentation

## 2018-01-09 DIAGNOSIS — E871 Hypo-osmolality and hyponatremia: Secondary | ICD-10-CM | POA: Diagnosis not present

## 2018-01-09 DIAGNOSIS — R918 Other nonspecific abnormal finding of lung field: Secondary | ICD-10-CM | POA: Diagnosis not present

## 2018-01-09 DIAGNOSIS — E119 Type 2 diabetes mellitus without complications: Secondary | ICD-10-CM | POA: Diagnosis not present

## 2018-01-09 DIAGNOSIS — E782 Mixed hyperlipidemia: Secondary | ICD-10-CM | POA: Diagnosis present

## 2018-01-09 DIAGNOSIS — N1831 Chronic kidney disease, stage 3a: Secondary | ICD-10-CM

## 2018-01-09 LAB — CBC WITH DIFFERENTIAL/PLATELET
BASOS ABS: 0 10*3/uL (ref 0.0–0.1)
BASOS PCT: 0 %
EOS ABS: 0.1 10*3/uL (ref 0.0–0.7)
Eosinophils Relative: 1 %
HEMATOCRIT: 38.1 % (ref 36.0–46.0)
HEMOGLOBIN: 12.5 g/dL (ref 12.0–15.0)
Lymphocytes Relative: 11 %
Lymphs Abs: 1 10*3/uL (ref 0.7–4.0)
MCH: 29.5 pg (ref 26.0–34.0)
MCHC: 32.8 g/dL (ref 30.0–36.0)
MCV: 89.9 fL (ref 78.0–100.0)
Monocytes Absolute: 0.9 10*3/uL (ref 0.1–1.0)
Monocytes Relative: 10 %
NEUTROS ABS: 6.7 10*3/uL (ref 1.7–7.7)
NEUTROS PCT: 78 %
Platelets: 220 10*3/uL (ref 150–400)
RBC: 4.24 MIL/uL (ref 3.87–5.11)
RDW: 12.6 % (ref 11.5–15.5)
WBC: 8.6 10*3/uL (ref 4.0–10.5)

## 2018-01-09 LAB — URINALYSIS, ROUTINE W REFLEX MICROSCOPIC
BILIRUBIN URINE: NEGATIVE
Glucose, UA: 50 mg/dL — AB
HGB URINE DIPSTICK: NEGATIVE
KETONES UR: NEGATIVE mg/dL
NITRITE: NEGATIVE
PH: 6 (ref 5.0–8.0)
Protein, ur: NEGATIVE mg/dL
Specific Gravity, Urine: 1.004 — ABNORMAL LOW (ref 1.005–1.030)
Squamous Epithelial / LPF: NONE SEEN

## 2018-01-09 LAB — COMPREHENSIVE METABOLIC PANEL
ALK PHOS: 63 U/L (ref 38–126)
ALT: 12 U/L — ABNORMAL LOW (ref 14–54)
AST: 21 U/L (ref 15–41)
Albumin: 3.9 g/dL (ref 3.5–5.0)
Anion gap: 10 (ref 5–15)
BILIRUBIN TOTAL: 0.8 mg/dL (ref 0.3–1.2)
BUN: 13 mg/dL (ref 6–20)
CALCIUM: 10.1 mg/dL (ref 8.9–10.3)
CO2: 24 mmol/L (ref 22–32)
Chloride: 95 mmol/L — ABNORMAL LOW (ref 101–111)
Creatinine, Ser: 0.93 mg/dL (ref 0.44–1.00)
GFR, EST NON AFRICAN AMERICAN: 56 mL/min — AB (ref >60)
Glucose, Bld: 153 mg/dL — ABNORMAL HIGH (ref 65–99)
Potassium: 4.6 mmol/L (ref 3.5–5.1)
SODIUM: 129 mmol/L — AB (ref 135–145)
TOTAL PROTEIN: 7.5 g/dL (ref 6.5–8.1)

## 2018-01-09 MED ORDER — SODIUM CHLORIDE 0.9 % IV SOLN
1.0000 g | INTRAVENOUS | Status: DC
  Administered 2018-01-10: 1 g via INTRAVENOUS
  Filled 2018-01-09: qty 1
  Filled 2018-01-09: qty 10

## 2018-01-09 MED ORDER — SODIUM CHLORIDE 0.9 % IV BOLUS (SEPSIS)
1000.0000 mL | Freq: Once | INTRAVENOUS | Status: AC
  Administered 2018-01-09: 1000 mL via INTRAVENOUS

## 2018-01-09 MED ORDER — LORAZEPAM 0.5 MG PO TABS
0.5000 mg | ORAL_TABLET | Freq: Two times a day (BID) | ORAL | Status: DC | PRN
  Administered 2018-01-09 – 2018-01-10 (×2): 0.5 mg via ORAL
  Filled 2018-01-09 (×2): qty 1

## 2018-01-09 MED ORDER — SODIUM CHLORIDE 0.9 % IV SOLN
1.0000 g | Freq: Once | INTRAVENOUS | Status: AC
  Administered 2018-01-09: 1 g via INTRAVENOUS
  Filled 2018-01-09: qty 10

## 2018-01-09 NOTE — ED Notes (Signed)
Patient taken to restroom via wheelchair. Obtained urine specimen

## 2018-01-09 NOTE — ED Notes (Signed)
Pt also reports chest tightness that has been going on the past few days

## 2018-01-09 NOTE — Progress Notes (Signed)
Pharmacy Antibiotic Note  Kara Dean is a 84 y.o. female admitted on 01/09/2018 with UTI.  Pharmacy has been consulted for ceftriaxone dosing.  Plan: Ceftriaxone 1gm IV q24h F/u cx and clinical progress Monitor V/S and labs  Height: 5' (152.4 cm) Weight: 125 lb (56.7 kg) IBW/kg (Calculated) : 45.5  Temp (24hrs), Avg:98.3 F (36.8 C), Min:98.3 F (36.8 C), Max:98.3 F (36.8 C)  Recent Labs  Lab 01/09/18 1823  WBC 8.6  CREATININE 0.93    Estimated Creatinine Clearance: 36.8 mL/min (by C-G formula based on SCr of 0.93 mg/dL).    Allergies  Allergen Reactions  . Codeine Other (See Comments)    unknown  . Latex Rash  . Morphine Other (See Comments)    unknown  . Niacin Other (See Comments)    unknown  . Sulfa Antibiotics Rash  . Sulfonamide Derivatives Rash    Antimicrobials this admission: Ceftriaxone 3/8 >>   Dose adjustments this admission: n/a  Microbiology results: 3/8 UCx: pending  Thank you for allowing pharmacy to be a part of this patient's care.  Isac Sarna, BS [place], [place] Clinical Pharmacist Pager [phone] 01/09/2018 9:58 PM

## 2018-01-09 NOTE — ED Provider Notes (Signed)
[facility] EMERGENCY DEPARTMENT Provider Note   CSN: [id number] Arrival date & time: 01/09/18  1715     History   Chief Complaint Chief Complaint  Patient presents with  . Anorexia    HPI Kara Dean is a 84 y.o. female.  HPI Patient presents with concern of weakness, anorexia. Patient was referred here by her physician. She notes that she is actually had minimal appetite for a long time, possibly years, but this is possibly worse over the past few days, with little drive to eat or drink. No focal pain, no syncope, no fever, no sustained vomiting, though she did have a few episodes of emesis earlier.  On secondary questioning patient describes some chest pressure some abdominal discomfort and some generalized pain, though it is unclear what the distribution actually is. After she spoke with her physician today, she was referred here for evaluation. She notes that she was recently started on an antidepressant, though this only began yesterday.  Past Medical History:  Diagnosis Date  . Anxiety   . Carotid bruit   . Cataract   . Crohn's colitis ([place])   . CVA (cerebral infarction)    No deficits  . Depression   . Diabetes mellitus    type 2  . Dyslipidemia   . GERD (gastroesophageal reflux disease)   . History of TIAs    no deficits  . Hyperlipidemia   . Hypertension   . Hyponatremia   . Osteoarthritis     Patient Active Problem List   Diagnosis Date Noted  . Peripheral edema 03/18/2017  . Chronic kidney disease (CKD) stage G3a/A3, moderately decreased glomerular filtration rate (GFR) between 45-59 mL/min/1.73 square meter and albuminuria creatinine ratio greater than 300 mg/g (HCC) 07/27/2014  . Gastroesophageal reflux disease without esophagitis 04/27/2014  . GAD (generalized anxiety disorder) 04/27/2014  . Late effect of cerebrovascular accident (CVA) 03/26/2010  . Hyperlipidemia 02/01/2009  . Depression 02/01/2009  . Essential hypertension 02/01/2009  .  OSTEOARTHRITIS 02/01/2009  . Type 2 diabetes mellitus treated with insulin ([place]) 02/01/2009    Past Surgical History:  Procedure Laterality Date  . ABDOMINAL HYSTERECTOMY    . BACK SURGERY  1994  . CATARACT EXTRACTION W/PHACO Left 09/19/2017   Procedure: CATARACT EXTRACTION PHACO AND INTRAOCULAR LENS PLACEMENT (IOC);  Surgeon: Baruch Goldmann, MD;  Location: [facility];  Service: Ophthalmology;  Laterality: Left;  CDE: 8.10  . CATARACT EXTRACTION W/PHACO Right 10/10/2017   Procedure: CATARACT EXTRACTION PHACO AND INTRAOCULAR LENS PLACEMENT RIGHT EYE;  Surgeon: Baruch Goldmann, MD;  Location: [facility];  Service: Ophthalmology;  Laterality: Right;  CDE: 9.60  . COLON RESECTION  1990's  . TUBAL LIGATION    . VESICOVAGINAL FISTULA CLOSURE W/ TAH  1984    OB History    No data available       Home Medications    Prior to Admission medications   Medication Sig Start Date End Date Taking? Authorizing Provider  amLODipine (NORVASC) 10 MG tablet Take 1 tablet (10 mg total) by mouth daily. 06/17/17  Yes Martin, Mary-Margaret, FNP  atorvastatin (LIPITOR) 10 MG tablet Take 1 tablet (10 mg total) by mouth daily. 06/17/17  Yes Martin, Mary-Margaret, FNP  B Complex Vitamins (B COMPLEX-B12) TABS Take 1 tablet by mouth daily.     Yes [provider]  Cholecalciferol (VITAMIN D3 PO) Take 1 capsule daily by mouth.   Yes [provider]  citalopram (CELEXA) 20 MG tablet Take 1 tablet (20 mg total) by mouth  daily. 10/07/17  Yes Hassell Done, Mary-Margaret, FNP  dipyridamole-aspirin (AGGRENOX) 200-25 MG 12hr capsule Take 1 capsule by mouth 2 (two) times daily. 06/17/17  Yes Martin, Mary-Margaret, FNP  fluticasone (FLONASE) 50 MCG/ACT nasal spray USE 2 SPRAYS IN EACH NOSTRIL ONCE DAILY. 11/24/17  Yes Martin, Mary-Margaret, FNP  furosemide (LASIX) 20 MG tablet TAKE 1 TABLET DAILY Patient taking differently: TAKE 1 TABLET DAILY AS NEEDED FOR FLUID 11/24/17  Yes Martin, Mary-Margaret, FNP  glipiZIDE  (GLIPIZIDE XL) 10 MG 24 hr tablet TAKE (1) TABLET DAILY WITH BREAKFAST. Patient taking differently: Take 10 mg daily with breakfast by mouth.  06/17/17  Yes Martin, Mary-Margaret, FNP  insulin glargine (LANTUS) 100 UNIT/ML injection Inject 0.38 mLs (38 Units total) into the skin every morning. 06/17/17  Yes Martin, Mary-Margaret, FNP  LORazepam (ATIVAN) 0.5 MG tablet Take 1 tablet (0.5 mg total) by mouth 2 (two) times daily. Patient taking differently: Take 0.5 mg by mouth 2 (two) times daily as needed for anxiety or sleep.  10/07/17  Yes Martin, Mary-Margaret, FNP  Magnesium 250 MG TABS Take 250 mg daily by mouth.    Yes [provider]  metoprolol succinate (TOPROL-XL) 100 MG 24 hr tablet Take 1 tablet (100 mg total) by mouth daily. Take with or immediately following a meal. 06/17/17  Yes Hassell Done, Mary-Margaret, FNP  olmesartan (BENICAR) 40 MG tablet TAKE 1 TABLET DAILY 12/23/17  Yes Hassell Done, Mary-Margaret, FNP  Omega-3 Fatty Acids (FISH OIL) 1000 MG CAPS Take 1 capsule by mouth daily.   Yes [provider]  ondansetron (ZOFRAN ODT) 4 MG disintegrating tablet Take 1 tablet (4 mg total) by mouth every 8 (eight) hours as needed for nausea or vomiting. 01/08/18  Yes Gottschalk, Ashly M, DO  pantoprazole (PROTONIX) 40 MG tablet Take 1 tablet (40 mg total) by mouth daily. 06/17/17  Yes Martin, Mary-Margaret, FNP  potassium chloride (K-DUR) 10 MEQ tablet Take 1 tablet (10 mEq total) by mouth daily. 06/17/17 01/09/18 Yes Martin, Mary-Margaret, FNP  glucose blood (ONE TOUCH ULTRA TEST) test strip CHECK BLOOD SUGER UP TO 3 TIMES A DAY 12/17/17   [name], FNP    Family History Family History  Problem Relation Age of Onset  . Coronary artery disease Sister        stent x 3  . Seizures Sister   . Coronary artery disease Brother        stent  . Hypertension Mother   . Stroke Mother   . Emphysema Father   . Alcohol abuse Brother   . Lung disease Brother     Social History Social  History   Tobacco Use  . Smoking status: Never Smoker  . Smokeless tobacco: Never Used  Substance Use Topics  . Alcohol use: No  . Drug use: No     Allergies   Codeine; Latex; Morphine; Niacin; Sulfa antibiotics; and Sulfonamide derivatives   Review of Systems Review of Systems  Constitutional:       Per HPI, otherwise negative  HENT:       Per HPI, otherwise negative  Respiratory:       Per HPI, otherwise negative  Cardiovascular:       Per HPI, otherwise negative  Gastrointestinal: Negative for vomiting.  Endocrine:       Negative aside from HPI  Genitourinary:       Neg aside from HPI   Musculoskeletal:       Per HPI, otherwise negative  Skin: Negative.   Neurological: Negative for syncope.  Psychiatric/Behavioral: Positive for dysphoric mood.     Physical Exam Updated Vital Signs BP (!) 173/52 (BP Location: Left Arm)   Pulse (!) 57   Temp 98.3 F (36.8 C) (Oral)   Resp 17   Ht 5' (1.524 m)   Wt 56.7 kg (125 lb)   SpO2 96%   BMI 24.41 kg/m   Physical Exam  Constitutional: She is oriented to person, place, and time. She appears well-developed and well-nourished. No distress.  HENT:  Head: Normocephalic and atraumatic.  Eyes: Conjunctivae and EOM are normal.  Cardiovascular: Normal rate and regular rhythm.  Pulmonary/Chest: Effort normal and breath sounds normal. No stridor. No respiratory distress.  Abdominal: She exhibits no distension.  Musculoskeletal: She exhibits no edema.  Neurological: She is alert and oriented to person, place, and time. No cranial nerve deficit.  Skin: Skin is warm and dry.  Psychiatric: She has a normal mood and affect.  Nursing note and vitals reviewed.    ED Treatments / Results  Labs (all labs ordered are listed, but only abnormal results are displayed) Labs Reviewed  COMPREHENSIVE METABOLIC PANEL - Abnormal; Notable for the following components:      Result Value   Sodium 129 (*)    Chloride 95 (*)    Glucose,  Bld 153 (*)    ALT 12 (*)    GFR calc non Af Amer 56 (*)    All other components within normal limits  URINALYSIS, ROUTINE W REFLEX MICROSCOPIC - Abnormal; Notable for the following components:   Color, Urine STRAW (*)    APPearance HAZY (*)    Specific Gravity, Urine 1.004 (*)    Glucose, UA 50 (*)    Leukocytes, UA LARGE (*)    Bacteria, UA RARE (*)    All other components within normal limits  CBC WITH DIFFERENTIAL/PLATELET    EKG  EKG Interpretation  Date/Time:  Friday January 09 2018 17:28:21 EST Ventricular Rate:  57 PR Interval:    QRS Duration: 92 QT Interval:  463 QTC Calculation: 451 R Axis:   -43 Text Interpretation:  Sinus rhythm Left ventricular hypertrophy Anterior injury pattern No significant change since last tracing Abnormal ekg Confirmed by Carmin Muskrat [id number]) on 01/09/2018 6:01:28 PM       Radiology Dg Chest 2 View  Result Date: 01/09/2018 CLINICAL DATA:  Weakness with chest tightness EXAM: CHEST - 2 VIEW COMPARISON:  10/14/2016 FINDINGS: Diffuse coarse interstitial opacity with slight asymmetric prominence at the medial left base but similar compared to prior and likely due to chronic interstitial changes. No acute consolidation or pleural effusion. Mild cardiomegaly with aortic atherosclerosis. No pneumothorax. IMPRESSION: 1. Diffuse coarse interstitial opacity similar compared to prior and felt consistent with chronic fibro interstitial changes 2. Mild cardiomegaly Electronically Signed   By: Donavan Foil M.D.   On: 01/09/2018 19:10    Procedures Procedures (including critical care time)  Medications Ordered in ED Medications  sodium chloride 0.9 % bolus 1,000 mL (not administered)  cefTRIAXone (ROCEPHIN) 1 g in sodium chloride 0.9 % 100 mL IVPB (not administered)     Initial Impression / Assessment and Plan / ED Course  I have reviewed the triage vital signs and the nursing notes.  Pertinent labs & imaging results that were available during my  care of the patient were reviewed by me and considered in my medical decision making (see chart for details).     8:49 PM Repeat exam the patient is in similar condition, weak in  appearance, but oriented appropriately. She now is accompanied by multiple family members.  On today's evaluation, there is evidence for urinary tract infection, and notable hyponatremia, consistent with dehydration, likely contributing to the patient's symptom of weakness, anorexia. Patient is receiving IV fluids, will receive antibiotics, and given her hyponatremia, weakness, infection, will be admitted for further evaluation and management.  Final Clinical Impressions(s) / ED Diagnoses  Weakness Hyponatremia Urinary tract infection   Carmin Muskrat, MD 01/09/18 2050

## 2018-01-09 NOTE — ED Triage Notes (Signed)
Loss of appetite for past few days.  Pt had diarrhea yesterday

## 2018-01-09 NOTE — H&P (Signed)
TRH H&P    Patient Demographics:    Kara Dean, is a 84 y.o. female  MRN: [id number]  DOB - 05/13/1933  Admit Date - 01/09/2018  Referring MD/NP/PA: Dr. Vanita Panda  Outpatient Primary MD for the patient is [name], FNP  Patient coming from: Home  Chief complaint-chest pain   HPI:    Kara Dean  is a 84 y.o. female, with history of CVA, hyperlipidemia, hypertension, osteoarthritis, diabetes mellitus on Lantus came to hospital with complaints of chest pain, numbness of both arms which lasted for a few minutes.  Patient denies any pain at this time.  She also has been having cough but denies coughing up any phlegm.  Denies shortness of breath. No nausea vomiting.  Complains of few episodes of diarrhea yesterday which has resolved. Patient also complains of generalized body aches. She does not have a history of CAD. Denies passing out.  No blurred vision. Denies dysuria Complains of poor p.o. intake In the ED, lab work was consistent with hyponatremia.    Review of systems:      All other systems reviewed and are negative.   With Past History of the following :    Past Medical History:  Diagnosis Date  . Anxiety   . Carotid bruit   . Cataract   . Crohn's colitis ([place])   . CVA (cerebral infarction)    No deficits  . Depression   . Diabetes mellitus    type 2  . Dyslipidemia   . GERD (gastroesophageal reflux disease)   . History of TIAs    no deficits  . Hyperlipidemia   . Hypertension   . Hyponatremia   . Osteoarthritis       Past Surgical History:  Procedure Laterality Date  . ABDOMINAL HYSTERECTOMY    . BACK SURGERY  1994  . CATARACT EXTRACTION W/PHACO Left 09/19/2017   Procedure: CATARACT EXTRACTION PHACO AND INTRAOCULAR LENS PLACEMENT (IOC);  Surgeon: Baruch Goldmann, MD;  Location: [facility];  Service: Ophthalmology;  Laterality: Left;  CDE: 8.10  . CATARACT  EXTRACTION W/PHACO Right 10/10/2017   Procedure: CATARACT EXTRACTION PHACO AND INTRAOCULAR LENS PLACEMENT RIGHT EYE;  Surgeon: Baruch Goldmann, MD;  Location: [facility];  Service: Ophthalmology;  Laterality: Right;  CDE: 9.60  . COLON RESECTION  1990's  . TUBAL LIGATION    . VESICOVAGINAL FISTULA CLOSURE W/ TAH  1984      Social History:      Social History   Tobacco Use  . Smoking status: Never Smoker  . Smokeless tobacco: Never Used  Substance Use Topics  . Alcohol use: No       Family History :     Family History  Problem Relation Age of Onset  . Coronary artery disease Sister        stent x 3  . Seizures Sister   . Coronary artery disease Brother        stent  . Hypertension Mother   . Stroke Mother   . Emphysema Father   . Alcohol abuse Brother   .  Lung disease Brother       Home Medications:   Prior to Admission medications   Medication Sig Start Date End Date Taking? Authorizing Provider  amLODipine (NORVASC) 10 MG tablet Take 1 tablet (10 mg total) by mouth daily. 06/17/17  Yes Martin, Mary-Margaret, FNP  atorvastatin (LIPITOR) 10 MG tablet Take 1 tablet (10 mg total) by mouth daily. 06/17/17  Yes Martin, Mary-Margaret, FNP  B Complex Vitamins (B COMPLEX-B12) TABS Take 1 tablet by mouth daily.     Yes [provider]  Cholecalciferol (VITAMIN D3 PO) Take 1 capsule daily by mouth.   Yes [provider]  citalopram (CELEXA) 20 MG tablet Take 1 tablet (20 mg total) by mouth daily. 10/07/17  Yes Martin, Mary-Margaret, FNP  dipyridamole-aspirin (AGGRENOX) 200-25 MG 12hr capsule Take 1 capsule by mouth 2 (two) times daily. 06/17/17  Yes Martin, Mary-Margaret, FNP  fluticasone (FLONASE) 50 MCG/ACT nasal spray USE 2 SPRAYS IN EACH NOSTRIL ONCE DAILY. 11/24/17  Yes Martin, Mary-Margaret, FNP  furosemide (LASIX) 20 MG tablet TAKE 1 TABLET DAILY Patient taking differently: TAKE 1 TABLET DAILY AS NEEDED FOR FLUID 11/24/17  Yes Martin, Mary-Margaret, FNP    glipiZIDE (GLIPIZIDE XL) 10 MG 24 hr tablet TAKE (1) TABLET DAILY WITH BREAKFAST. Patient taking differently: Take 10 mg daily with breakfast by mouth.  06/17/17  Yes Martin, Mary-Margaret, FNP  insulin glargine (LANTUS) 100 UNIT/ML injection Inject 0.38 mLs (38 Units total) into the skin every morning. 06/17/17  Yes Martin, Mary-Margaret, FNP  LORazepam (ATIVAN) 0.5 MG tablet Take 1 tablet (0.5 mg total) by mouth 2 (two) times daily. Patient taking differently: Take 0.5 mg by mouth 2 (two) times daily as needed for anxiety or sleep.  10/07/17  Yes Martin, Mary-Margaret, FNP  Magnesium 250 MG TABS Take 250 mg daily by mouth.    Yes [provider]  metoprolol succinate (TOPROL-XL) 100 MG 24 hr tablet Take 1 tablet (100 mg total) by mouth daily. Take with or immediately following a meal. 06/17/17  Yes Hassell Done, Mary-Margaret, FNP  olmesartan (BENICAR) 40 MG tablet TAKE 1 TABLET DAILY 12/23/17  Yes Hassell Done, Mary-Margaret, FNP  Omega-3 Fatty Acids (FISH OIL) 1000 MG CAPS Take 1 capsule by mouth daily.   Yes [provider]  ondansetron (ZOFRAN ODT) 4 MG disintegrating tablet Take 1 tablet (4 mg total) by mouth every 8 (eight) hours as needed for nausea or vomiting. 01/08/18  Yes Gottschalk, Ashly M, DO  pantoprazole (PROTONIX) 40 MG tablet Take 1 tablet (40 mg total) by mouth daily. 06/17/17  Yes Martin, Mary-Margaret, FNP  potassium chloride (K-DUR) 10 MEQ tablet Take 1 tablet (10 mEq total) by mouth daily. 06/17/17 01/09/18 Yes Martin, Mary-Margaret, FNP  glucose blood (ONE TOUCH ULTRA TEST) test strip CHECK BLOOD SUGER UP TO 3 TIMES A DAY 12/17/17   [name], FNP     Allergies:     Allergies  Allergen Reactions  . Codeine Other (See Comments)    unknown  . Latex Rash  . Morphine Other (See Comments)    unknown  . Niacin Other (See Comments)    unknown  . Sulfa Antibiotics Rash  . Sulfonamide Derivatives Rash     Physical Exam:   Vitals  Blood pressure 113/74,  pulse (!) 101, temperature 98.3 F (36.8 C), temperature source Oral, resp. rate (!) 23, height 5' (1.524 m), weight 56.7 kg (125 lb), SpO2 93 %.  1.  General: Appears in no acute distress  2. Psychiatric:  Intact judgement  and  insight, awake alert, oriented x 3.  3. Neurologic: No focal neurological deficits, all cranial nerves intact.Strength 5/5 all 4 extremities, sensation intact all 4 extremities, plantars down going.  4. Eyes :  anicteric sclerae, moist conjunctivae with no lid lag. PERRLA.  5. ENMT:  Oropharynx clear with moist mucous membranes and good dentition  6. Neck:  supple, no cervical lymphadenopathy appriciated, No thyromegaly  7. Respiratory : Normal respiratory effort, good air movement bilaterally,clear to  auscultation bilaterally  8. Cardiovascular : RRR, no gallops, rubs or murmurs, no leg edema  9. Gastrointestinal:  Positive bowel sounds, abdomen soft, non-tender to palpation,no hepatosplenomegaly, no rigidity or guarding       10. Skin:  No cyanosis, normal texture and turgor, no rash, lesions or ulcers  11.Musculoskeletal:  Good muscle tone,  joints appear normal , no effusions,  normal range of motion    Data Review:    CBC Recent Labs  Lab 01/09/18 1823  WBC 8.6  HGB 12.5  HCT 38.1  PLT 220  MCV 89.9  MCH 29.5  MCHC 32.8  RDW 12.6  LYMPHSABS 1.0  MONOABS 0.9  EOSABS 0.1  BASOSABS 0.0   ------------------------------------------------------------------------------------------------------------------  Chemistries  Recent Labs  Lab 01/09/18 1823  NA 129*  K 4.6  CL 95*  CO2 24  GLUCOSE 153*  BUN 13  CREATININE 0.93  CALCIUM 10.1  AST 21  ALT 12*  ALKPHOS 63  BILITOT 0.8    ------------------------------------------------------------------------------------------------------------------  ------------------------------------------------------------------------------------------------------------------ GFR: Estimated Creatinine Clearance: 36.8 mL/min (by C-G formula based on SCr of 0.93 mg/dL). Liver Function Tests: Recent Labs  Lab 01/09/18 1823  AST 21  ALT 12*  ALKPHOS 63  BILITOT 0.8  PROT 7.5  ALBUMIN 3.9    --------------------------------------------------------------------------------------------------------------- Urine analysis:    Component Value Date/Time   COLORURINE STRAW (A) 01/09/2018 1922   APPEARANCEUR HAZY (A) 01/09/2018 1922   LABSPEC 1.004 (L) 01/09/2018 1922   PHURINE 6.0 01/09/2018 1922   GLUCOSEU 50 (A) 01/09/2018 1922   HGBUR NEGATIVE 01/09/2018 [place] NEGATIVE 01/09/2018 [place] NEGATIVE 01/09/2018 1922   PROTEINUR NEGATIVE 01/09/2018 1922   UROBILINOGEN 0.2 11/09/2010 2231   NITRITE NEGATIVE 01/09/2018 1922   LEUKOCYTESUR LARGE (A) 01/09/2018 1922      Imaging Results:    Dg Chest 2 View  Result Date: 01/09/2018 CLINICAL DATA:  Weakness with chest tightness EXAM: CHEST - 2 VIEW COMPARISON:  10/14/2016 FINDINGS: Diffuse coarse interstitial opacity with slight asymmetric prominence at the medial left base but similar compared to prior and likely due to chronic interstitial changes. No acute consolidation or pleural effusion. Mild cardiomegaly with aortic atherosclerosis. No pneumothorax. IMPRESSION: 1. Diffuse coarse interstitial opacity similar compared to prior and felt consistent with chronic fibro interstitial changes 2. Mild cardiomegaly Electronically Signed   By: Donavan Foil M.D.   On: 01/09/2018 19:10    My personal review of EKG: Rhythm NSR, LVH   Assessment & Plan:    Active Problems:   Hyperlipidemia   Essential hypertension   Type 2 diabetes mellitus treated with insulin  (HCC)   Chest pain   1. Chest pain-placed under observation, obtain serial troponin every 6 hours x3, rule out ACS.  Chest pain has resolved at this time. 2. Hyponatremia- mild, sodium is 129 likely from poor p.o. intake, patient is also on Lasix.  Will hold Lasix at this time.  Put on fluid restriction 1200 cc/day.  Check serum osmolality.  Follow BMP  in a.m. 3. UTI- patient has abnormal UA, will start ceftriaxone.Follow urine culture results. 4. Diabetes mellitus-continue Lantus 38 units subcu daily, initiate sliding scale insulin with NovoLog. 5. Hypertension-continue metoprolol, Norvasc, will hold Benicar 6. Hyperlipidemia-continue Lipitor 7. History of CVA-continue Aggrenox   DVT Prophylaxis-   Lovenox   AM Labs Ordered, also please review Full Orders  Family Communication: Admission, patients condition and plan of care including tests being ordered have been discussed with the patient  who indicate understanding and agree with the plan and Code Status.  Code Status: Full code  Admission status: Observation  Time spent in minutes : 60 minutes   Oswald Hillock M.D on 01/09/2018 at 9:49 PM  Between 7am to 7pm - Pager - [phone]. After 7pm go to [email or web address] - password [email or web address]  [facility] - Office  [phone]

## 2018-01-10 ENCOUNTER — Other Ambulatory Visit: Payer: Self-pay

## 2018-01-10 DIAGNOSIS — Z7982 Long term (current) use of aspirin: Secondary | ICD-10-CM | POA: Diagnosis not present

## 2018-01-10 DIAGNOSIS — E11649 Type 2 diabetes mellitus with hypoglycemia without coma: Secondary | ICD-10-CM | POA: Diagnosis not present

## 2018-01-10 DIAGNOSIS — Z8673 Personal history of transient ischemic attack (TIA), and cerebral infarction without residual deficits: Secondary | ICD-10-CM | POA: Diagnosis not present

## 2018-01-10 DIAGNOSIS — E871 Hypo-osmolality and hyponatremia: Secondary | ICD-10-CM | POA: Diagnosis not present

## 2018-01-10 DIAGNOSIS — K219 Gastro-esophageal reflux disease without esophagitis: Secondary | ICD-10-CM | POA: Diagnosis not present

## 2018-01-10 DIAGNOSIS — K5010 Crohn's disease of large intestine without complications: Secondary | ICD-10-CM | POA: Diagnosis not present

## 2018-01-10 DIAGNOSIS — Z885 Allergy status to narcotic agent status: Secondary | ICD-10-CM | POA: Diagnosis not present

## 2018-01-10 DIAGNOSIS — Z794 Long term (current) use of insulin: Secondary | ICD-10-CM | POA: Diagnosis not present

## 2018-01-10 DIAGNOSIS — R630 Anorexia: Secondary | ICD-10-CM | POA: Diagnosis not present

## 2018-01-10 DIAGNOSIS — Z79899 Other long term (current) drug therapy: Secondary | ICD-10-CM | POA: Diagnosis not present

## 2018-01-10 DIAGNOSIS — E785 Hyperlipidemia, unspecified: Secondary | ICD-10-CM | POA: Diagnosis not present

## 2018-01-10 DIAGNOSIS — R079 Chest pain, unspecified: Secondary | ICD-10-CM | POA: Diagnosis not present

## 2018-01-10 DIAGNOSIS — N3000 Acute cystitis without hematuria: Secondary | ICD-10-CM | POA: Diagnosis not present

## 2018-01-10 DIAGNOSIS — I700 Atherosclerosis of aorta: Secondary | ICD-10-CM | POA: Diagnosis not present

## 2018-01-10 DIAGNOSIS — F419 Anxiety disorder, unspecified: Secondary | ICD-10-CM | POA: Diagnosis not present

## 2018-01-10 DIAGNOSIS — Z882 Allergy status to sulfonamides status: Secondary | ICD-10-CM | POA: Diagnosis not present

## 2018-01-10 DIAGNOSIS — F329 Major depressive disorder, single episode, unspecified: Secondary | ICD-10-CM | POA: Diagnosis not present

## 2018-01-10 DIAGNOSIS — I10 Essential (primary) hypertension: Secondary | ICD-10-CM | POA: Diagnosis not present

## 2018-01-10 LAB — TROPONIN I
Troponin I: <0.03 ng/mL (ref <0.03)
Troponin I: <0.03 ng/mL (ref <0.03)
Troponin I: <0.03 ng/mL (ref <0.03)

## 2018-01-10 LAB — CBC
HEMATOCRIT: 35.4 % — AB (ref 36.0–46.0)
HEMOGLOBIN: 12 g/dL (ref 12.0–15.0)
MCH: 30.7 pg (ref 26.0–34.0)
MCHC: 33.9 g/dL (ref 30.0–36.0)
MCV: 90.5 fL (ref 78.0–100.0)
Platelets: 215 10*3/uL (ref 150–400)
RBC: 3.91 MIL/uL (ref 3.87–5.11)
RDW: 12.9 % (ref 11.5–15.5)
WBC: 8 10*3/uL (ref 4.0–10.5)

## 2018-01-10 LAB — COMPREHENSIVE METABOLIC PANEL
ALBUMIN: 3.6 g/dL (ref 3.5–5.0)
ALK PHOS: 58 U/L (ref 38–126)
ALT: 12 U/L — ABNORMAL LOW (ref 14–54)
ANION GAP: 8 (ref 5–15)
AST: 20 U/L (ref 15–41)
BUN: 10 mg/dL (ref 6–20)
CHLORIDE: 99 mmol/L — AB (ref 101–111)
CO2: 25 mmol/L (ref 22–32)
Calcium: 9.2 mg/dL (ref 8.9–10.3)
Creatinine, Ser: 0.94 mg/dL (ref 0.44–1.00)
GFR calc Af Amer: >60 mL/min (ref >60)
GFR calc non Af Amer: 55 mL/min — ABNORMAL LOW (ref >60)
GLUCOSE: 119 mg/dL — AB (ref 65–99)
Potassium: 4.1 mmol/L (ref 3.5–5.1)
SODIUM: 132 mmol/L — AB (ref 135–145)
Total Bilirubin: 0.3 mg/dL (ref 0.3–1.2)
Total Protein: 6.7 g/dL (ref 6.5–8.1)

## 2018-01-10 LAB — GLUCOSE, CAPILLARY
GLUCOSE-CAPILLARY: 93 mg/dL (ref 65–99)
GLUCOSE-CAPILLARY: 141 mg/dL — AB (ref 65–99)
GLUCOSE-CAPILLARY: 66 mg/dL (ref 65–99)
GLUCOSE-CAPILLARY: 176 mg/dL — AB (ref 65–99)
GLUCOSE-CAPILLARY: 124 mg/dL — AB (ref 65–99)
GLUCOSE-CAPILLARY: 118 mg/dL — AB (ref 65–99)
Glucose-Capillary: 49 mg/dL — ABNORMAL LOW (ref 65–99)
Glucose-Capillary: 69 mg/dL (ref 65–99)

## 2018-01-10 LAB — OSMOLALITY: OSMOLALITY: 279 mosm/kg (ref 275–295)

## 2018-01-10 MED ORDER — INSULIN GLARGINE 100 UNIT/ML ~~LOC~~ SOLN
38.0000 [IU] | Freq: Every morning | SUBCUTANEOUS | Status: DC
Start: 2018-01-10 — End: 2018-01-11
  Administered 2018-01-10: 38 [IU] via SUBCUTANEOUS
  Filled 2018-01-10 (×2): qty 0.38

## 2018-01-10 MED ORDER — INSULIN ASPART 100 UNIT/ML ~~LOC~~ SOLN
0.0000 [IU] | Freq: Three times a day (TID) | SUBCUTANEOUS | Status: DC
  Administered 2018-01-10: 2 [IU] via SUBCUTANEOUS

## 2018-01-10 MED ORDER — ATORVASTATIN CALCIUM 10 MG PO TABS
10.0000 mg | ORAL_TABLET | Freq: Every day | ORAL | Status: DC
  Administered 2018-01-10 – 2018-01-11 (×2): 10 mg via ORAL
  Filled 2018-01-10 (×2): qty 1

## 2018-01-10 MED ORDER — ACETAMINOPHEN 650 MG RE SUPP: 650.0000 mg | Freq: Four times a day (QID) | RECTAL | Status: DC | PRN

## 2018-01-10 MED ORDER — PANTOPRAZOLE SODIUM 40 MG PO TBEC
40.0000 mg | DELAYED_RELEASE_TABLET | Freq: Every day | ORAL | Status: DC
  Administered 2018-01-10 – 2018-01-11 (×2): 40 mg via ORAL
  Filled 2018-01-10 (×2): qty 1

## 2018-01-10 MED ORDER — SODIUM CHLORIDE 0.9 % IV SOLN
INTRAVENOUS | Status: DC
  Administered 2018-01-10: 02:00:00 via INTRAVENOUS

## 2018-01-10 MED ORDER — CITALOPRAM HYDROBROMIDE 20 MG PO TABS
20.0000 mg | ORAL_TABLET | Freq: Every day | ORAL | Status: DC
Start: 2018-01-10 — End: 2018-01-11
  Administered 2018-01-10 – 2018-01-11 (×2): 20 mg via ORAL
  Filled 2018-01-10 (×2): qty 1

## 2018-01-10 MED ORDER — AMLODIPINE BESYLATE 5 MG PO TABS
10.0000 mg | ORAL_TABLET | Freq: Every day | ORAL | Status: DC
  Administered 2018-01-10 – 2018-01-11 (×2): 10 mg via ORAL
  Filled 2018-01-10 (×2): qty 2

## 2018-01-10 MED ORDER — METOPROLOL SUCCINATE ER 100 MG PO TB24
100.0000 mg | ORAL_TABLET | Freq: Every day | ORAL | Status: DC
  Administered 2018-01-10 – 2018-01-11 (×2): 100 mg via ORAL
  Filled 2018-01-10 (×2): qty 1

## 2018-01-10 MED ORDER — ENOXAPARIN SODIUM 40 MG/0.4ML ~~LOC~~ SOLN
40.0000 mg | SUBCUTANEOUS | Status: DC
  Filled 2018-01-10: qty 0.4

## 2018-01-10 MED ORDER — ASPIRIN-DIPYRIDAMOLE ER 25-200 MG PO CP12
1.0000 | ORAL_CAPSULE | Freq: Two times a day (BID) | ORAL | Status: DC
  Administered 2018-01-10 – 2018-01-11 (×4): 1 via ORAL
  Filled 2018-01-10 (×5): qty 1

## 2018-01-10 MED ORDER — ACETAMINOPHEN 325 MG PO TABS: 650.0000 mg | ORAL_TABLET | Freq: Four times a day (QID) | ORAL | Status: DC | PRN

## 2018-01-10 NOTE — Progress Notes (Signed)
Pt arrived from [facility]. Phlebotomy unable to see active lab orders. Labs reordered. Will continue to monitor.

## 2018-01-10 NOTE — Progress Notes (Signed)
Hypoglycemic Event  CBG: 49  Treatment: 15 GM carbohydrate snack  Symptoms: None  Follow-up CBG: Time: 0139 CBG Result:93  Possible Reasons for Event: Inadequate meal intake  Comments/MD notified:[place], NP    Tharun Cappella

## 2018-01-10 NOTE — Progress Notes (Signed)
Patient Demographics:    Kara Dean, is a 84 y.o. female, DOB - August 04, 1933, QKM:[id number]  Admit date - 01/09/2018   Admitting Physician Oswald Hillock, MD  Outpatient Primary MD for the patient is [name], [place]  LOS - 0   Chief Complaint  Patient presents with  . Anorexia        Subjective:    Kara Dean today has no fevers, no emesis,  No chest pain,     Assessment  & Plan :    Active Problems:   Hyperlipidemia   Essential hypertension   Type 2 diabetes mellitus treated with insulin ([place])   Chest pain   Transferred from [facility] overnight (01/09/18 thru 01/10/18)  Plan:-  1)Chest pain-patient is currently chest pain-free, ACS rule out in progress, patient is already on Aggrenox and Lipitor continue both, continue metoprolol, consider switching Aggrenox to Plavix if concerns about CAD/angina  2)Hyponatremia-  sodium is up to 132 from 129, avoid excessive free water intake continue to hold Lasix  3)Possible UTI-continue IV Rocephin pending cultures  4)DM-patient had episodes of hypoglycemia due to poor oral intake, eating better now, continue Lantus 38 units daily, Use Novolog/Humalog Sliding scale insulin with Accu-Cheks/Fingersticks as ordered  5)HTN-continue metoprolol, hold Benicar due to kidney concerns, continue amlodipine  6)H/o prior CVA- admitted on Aggrenox, continue Lipitor, consider switching Aggrenox to Plavix if concerns about CAD/angina   Code Status : Full    Disposition Plan  : ?? Home    DVT Prophylaxis  :  Lovenox   Lab Results  Component Value Date   PLT 215 01/10/2018    Inpatient Medications  Scheduled Meds: . amLODipine  10 mg Oral Daily  . atorvastatin  10 mg Oral Daily  . citalopram  20 mg Oral Daily  . dipyridamole-aspirin  1 capsule Oral BID  . enoxaparin (LOVENOX) injection  40 mg Subcutaneous Q24H  . insulin aspart  0-9 Units  Subcutaneous TID WC  . insulin glargine  38 Units Subcutaneous q morning - 10a  . metoprolol succinate  100 mg Oral Daily  . pantoprazole  40 mg Oral Daily   Continuous Infusions: . sodium chloride 10 mL/hr at 01/10/18 0147  . cefTRIAXone (ROCEPHIN)  IV     PRN Meds:.acetaminophen **OR** acetaminophen, LORazepam    Anti-infectives (From admission, onward)   Start     Dose/Rate Route Frequency Ordered Stop   01/10/18 2200  cefTRIAXone (ROCEPHIN) 1 g in sodium chloride 0.9 % 100 mL IVPB     1 g 200 mL/hr over 30 Minutes Intravenous Every 24 hours 01/09/18 2201     01/09/18 2100  cefTRIAXone (ROCEPHIN) 1 g in sodium chloride 0.9 % 100 mL IVPB     1 g 200 mL/hr over 30 Minutes Intravenous  Once 01/09/18 2048 01/09/18 2228        Objective:   Vitals:   01/10/18 0104 01/10/18 0538 01/10/18 1017 01/10/18 1514  BP: (!) 169/58 (!) 152/75 (!) 161/52 (!) 146/59  Pulse: (!) 58 70 60 63  Resp: 16 18  18   Temp: 98.2 F (36.8 C) 99 F (37.2 C)  99.3 F (37.4 C)  TempSrc: Oral Oral  Oral  SpO2: 94% 96%  95%  Weight: 56.3 kg (  124 lb 1.9 oz)     Height: 5' (1.524 m)       Wt Readings from Last 3 Encounters:  01/10/18 56.3 kg (124 lb 1.9 oz)  01/08/18 56.9 kg (125 lb 6 oz)  10/10/17 56.2 kg (124 lb)     Intake/Output Summary (Last 24 hours) at 01/10/2018 1857 Last data filed at 01/10/2018 1516 Gross per 24 hour  Intake 2042.17 ml  Output -  Net 2042.17 ml     Physical Exam  Gen:- Awake Alert,  In no apparent distress  HEENT:- [place].AT, No sclera icterus Neck-Supple Neck,No JVD,.  Lungs-  CTAB  CV- S1, S2 normal, CABG scar Abd-  +ve B.Sounds, Abd Soft, No tenderness,    Extremity/Skin:- No  edema,   Lt arm midline iv access Psych-affect is appropriate Neuro-no new focal deficits   Data Review:   Micro Results No results found for this or any previous visit (from the past 240 hour(s)).  Radiology Reports Dg Chest 2 View  Result Date: 01/09/2018 CLINICAL DATA:  Weakness  with chest tightness EXAM: CHEST - 2 VIEW COMPARISON:  10/14/2016 FINDINGS: Diffuse coarse interstitial opacity with slight asymmetric prominence at the medial left base but similar compared to prior and likely due to chronic interstitial changes. No acute consolidation or pleural effusion. Mild cardiomegaly with aortic atherosclerosis. No pneumothorax. IMPRESSION: 1. Diffuse coarse interstitial opacity similar compared to prior and felt consistent with chronic fibro interstitial changes 2. Mild cardiomegaly Electronically Signed   By: Donavan Foil M.D.   On: 01/09/2018 19:10     CBC Recent Labs  Lab 01/09/18 1823 01/10/18 0317  WBC 8.6 8.0  HGB 12.5 12.0  HCT 38.1 35.4*  PLT 220 215  MCV 89.9 90.5  MCH 29.5 30.7  MCHC 32.8 33.9  RDW 12.6 12.9  LYMPHSABS 1.0  --   MONOABS 0.9  --   EOSABS 0.1  --   BASOSABS 0.0  --     Chemistries  Recent Labs  Lab 01/09/18 1823 01/10/18 0317  NA 129* 132*  K 4.6 4.1  CL 95* 99*  CO2 24 25  GLUCOSE 153* 119*  BUN 13 10  CREATININE 0.93 0.94  CALCIUM 10.1 9.2  AST 21 20  ALT 12* 12*  ALKPHOS 63 58  BILITOT 0.8 0.3   ------------------------------------------------------------------------------------------------------------------ No results for input(s): CHOL, HDL, LDLCALC, TRIG, CHOLHDL, LDLDIRECT in the last 72 hours.  Lab Results  Component Value Date   HGBA1C 7.5 12/28/2015   ------------------------------------------------------------------------------------------------------------------ No results for input(s): TSH, T4TOTAL, T3FREE, THYROIDAB in the last 72 hours.  Invalid input(s): FREET3 ------------------------------------------------------------------------------------------------------------------ No results for input(s): VITAMINB12, FOLATE, FERRITIN, TIBC, IRON, RETICCTPCT in the last 72 hours.  Coagulation profile No results for input(s): INR, PROTIME in the last 168 hours.  No results for input(s): DDIMER in the  last 72 hours.  Cardiac Enzymes Recent Labs  Lab 01/10/18 0317 01/10/18 0913 01/10/18 1419  TROPONINI <0.03 <0.03 <0.03   ------------------------------------------------------------------------------------------------------------------    Component Value Date/Time   BNP 133.5 (H) 03/18/2017 1050     Eudell Julian M.D on 01/10/2018 at 6:57 PM  Between 7am to 7pm - Pager - [phone]  After 7pm go to [email or web address] - password [email or web address]  [facility] -  Office  [phone]   Voice Recognition Viviann Spare dictation system was used to create this note, attempts have been made to correct errors. Please contact the author with questions and/or clarifications.

## 2018-01-11 DIAGNOSIS — E871 Hypo-osmolality and hyponatremia: Secondary | ICD-10-CM | POA: Diagnosis not present

## 2018-01-11 DIAGNOSIS — N3000 Acute cystitis without hematuria: Secondary | ICD-10-CM | POA: Diagnosis not present

## 2018-01-11 DIAGNOSIS — R079 Chest pain, unspecified: Secondary | ICD-10-CM | POA: Diagnosis not present

## 2018-01-11 LAB — URINE CULTURE: Culture: 40000 — AB

## 2018-01-11 LAB — HEMOGLOBIN A1C
Hgb A1c MFr Bld: 6.6 % — ABNORMAL HIGH (ref 4.8–5.6)
MEAN PLASMA GLUCOSE: 143 mg/dL

## 2018-01-11 LAB — GLUCOSE, CAPILLARY
GLUCOSE-CAPILLARY: 70 mg/dL (ref 65–99)
Glucose-Capillary: 158 mg/dL — ABNORMAL HIGH (ref 65–99)
Glucose-Capillary: 179 mg/dL — ABNORMAL HIGH (ref 65–99)

## 2018-01-11 LAB — TROPONIN I: Troponin I: <0.03 ng/mL (ref <0.03)

## 2018-01-11 MED ORDER — HYDRALAZINE HCL 20 MG/ML IJ SOLN: 10.0000 mg | INTRAMUSCULAR | Status: DC | PRN

## 2018-01-11 MED ORDER — AMOXICILLIN 500 MG PO CAPS: 500.0000 mg | ORAL_CAPSULE | Freq: Three times a day (TID) | ORAL | 0 refills | Status: AC

## 2018-01-11 MED ORDER — HYDRALAZINE HCL 20 MG/ML IJ SOLN: 10.0000 mg | Freq: Four times a day (QID) | INTRAMUSCULAR | Status: DC | PRN

## 2018-01-11 MED ORDER — INSULIN GLARGINE 100 UNIT/ML ~~LOC~~ SOLN: 34.0000 [IU] | Freq: Every morning | SUBCUTANEOUS | 5 refills | Status: DC

## 2018-01-11 MED ORDER — INSULIN GLARGINE 100 UNIT/ML ~~LOC~~ SOLN
35.0000 [IU] | Freq: Every day | SUBCUTANEOUS | Status: DC
  Administered 2018-01-11: 35 [IU] via SUBCUTANEOUS
  Filled 2018-01-11: qty 0.35

## 2018-01-11 MED ORDER — AMLODIPINE BESYLATE 10 MG PO TABS: 10.0000 mg | ORAL_TABLET | Freq: Every day | ORAL | 1 refills | Status: DC

## 2018-01-11 NOTE — Discharge Summary (Signed)
Kara Dean, is a 84 y.o. female  DOB [birth year]  MRN [id number].  Admission date:  01/09/2018  Admitting Physician  Oswald Hillock, MD  Discharge Date:  01/11/2018   Primary MD  [name], FNP  Recommendations for primary care physician for things to follow:   Admission Diagnosis  Hyponatremia [E87.1] Acute cystitis without hematuria [N30.00]   Discharge Diagnosis  Hyponatremia [E87.1] Acute cystitis without hematuria [N30.00]    Active Problems:   Hyperlipidemia   Essential hypertension   Type 2 diabetes mellitus treated with insulin ([place])   Chest pain      Past Medical History:  Diagnosis Date  . Anxiety   . Carotid bruit   . Cataract   . Crohn's colitis ([place])   . CVA (cerebral infarction)    No deficits  . Depression   . Diabetes mellitus    type 2  . Dyslipidemia   . GERD (gastroesophageal reflux disease)   . History of TIAs    no deficits  . Hyperlipidemia   . Hypertension   . Hyponatremia   . Osteoarthritis     Past Surgical History:  Procedure Laterality Date  . ABDOMINAL HYSTERECTOMY    . BACK SURGERY  1994  . CATARACT EXTRACTION W/PHACO Left 09/19/2017   Procedure: CATARACT EXTRACTION PHACO AND INTRAOCULAR LENS PLACEMENT (IOC);  Surgeon: Baruch Goldmann, MD;  Location: [facility];  Service: Ophthalmology;  Laterality: Left;  CDE: 8.10  . CATARACT EXTRACTION W/PHACO Right 10/10/2017   Procedure: CATARACT EXTRACTION PHACO AND INTRAOCULAR LENS PLACEMENT RIGHT EYE;  Surgeon: Baruch Goldmann, MD;  Location: [facility];  Service: Ophthalmology;  Laterality: Right;  CDE: 9.60  . COLON RESECTION  1990's  . TUBAL LIGATION    . VESICOVAGINAL FISTULA CLOSURE W/ TAH  1984       HPI  from the history and physical done on the day of admission:      Kara Dean  is a 84 y.o. female, with history of CVA, hyperlipidemia, hypertension, osteoarthritis, diabetes mellitus on  Lantus came to hospital with complaints of chest pain, numbness of both arms which lasted for a few minutes.  Patient denies any pain at this time.  She also has been having cough but denies coughing up any phlegm.  Denies shortness of breath. No nausea vomiting.  Complains of few episodes of diarrhea yesterday which has resolved. Patient also complains of generalized body aches. She does not have a history of CAD. Denies passing out.  No blurred vision. Denies dysuria Complains of poor p.o. intake In the ED, lab work was consistent with hyponatremia.    Hospital Course:    1)Chest pain- patient is currently chest pain-free, Ruled out for ACS by cardiac enzymes and EKG.  patient is already on Aggrenox and Lipitor continue both, continue metoprolol, consider switching Aggrenox to Plavix if concerns about CAD/angina  2)Hyponatremia- sodium is up to 132 from 129, avoid excessive free water intake   3) Corynebacterium UTI-treated with IV Rocephin , okay to discharge on  amoxicillin  4)DM-patient had episodes of hypoglycemia due to poor oral intake, eating better now, stop glipizide, decrease Lantus to 34 units daily,  small frequent meals and snacks advised  5)HTN-continue metoprolol, continue amlodipine  6)H/o prior CVA- admitted on Aggrenox, continue Lipitor, consider switching Aggrenox to Plavix if concerns about CAD/angina    Discharge Condition: stable  Follow UP- pcp   Diet and Activity recommendation:  As advised  Discharge Instructions     Discharge Instructions    Call MD for:  difficulty breathing, headache or visual disturbances   Complete by:  As directed    Call MD for:  persistant dizziness or light-headedness   Complete by:  As directed    Call MD for:  persistant nausea and vomiting   Complete by:  As directed    Call MD for:  severe uncontrolled pain   Complete by:  As directed    Call MD for:  temperature >100.4   Complete by:  As directed    Diet -  low sodium heart healthy   Complete by:  As directed    Diet Carb Modified   Complete by:  As directed    Discharge instructions   Complete by:  As directed    1) stop glipizide due to concerns about low blood sugars 2) decrease Lantus insulin to 34 units from 38 units daily due to concerns about low blood sugars 3) eat small frequent meals throughout the day and snacks to avoid low blood sugars 4) take the rest of your medications as prescribed 5)Your primary care doctor will help you decide whether to switch you from Aggrenox to Plavix if you continue to have chest pains from time to time   Increase activity slowly   Complete by:  As directed         Discharge Medications     Allergies as of 01/11/2018      Reactions   Codeine Other (See Comments)   unknown   Latex Rash   Morphine Other (See Comments)   unknown   Niacin Other (See Comments)   unknown   Sulfa Antibiotics Rash   Sulfonamide Derivatives Rash      Medication List    STOP taking these medications   glipiZIDE 10 MG 24 hr tablet Commonly known as:  GLIPIZIDE XL     TAKE these medications   amLODipine 10 MG tablet Commonly known as:  NORVASC Take 1 tablet (10 mg total) by mouth daily.   amoxicillin 500 MG capsule Commonly known as:  AMOXIL Take 1 capsule (500 mg total) by mouth 3 (three) times daily for 5 days.   atorvastatin 10 MG tablet Commonly known as:  LIPITOR Take 1 tablet (10 mg total) by mouth daily.   B Complex-B12 Tabs Take 1 tablet by mouth daily.   citalopram 20 MG tablet Commonly known as:  CELEXA Take 1 tablet (20 mg total) by mouth daily.   dipyridamole-aspirin 200-25 MG 12hr capsule Commonly known as:  AGGRENOX Take 1 capsule by mouth 2 (two) times daily.   Fish Oil 1000 MG Caps Take 1 capsule by mouth daily.   fluticasone 50 MCG/ACT nasal spray Commonly known as:  FLONASE USE 2 SPRAYS IN EACH NOSTRIL ONCE DAILY.   furosemide 20 MG tablet Commonly known as:   LASIX TAKE 1 TABLET DAILY What changed:    how much to take  how to take this  when to take this   glucose blood test strip Commonly known  as:  ONE TOUCH ULTRA TEST CHECK BLOOD SUGER UP TO 3 TIMES A DAY   insulin glargine 100 UNIT/ML injection Commonly known as:  LANTUS Inject 0.34 mLs (34 Units total) into the skin every morning. What changed:  how much to take   LORazepam 0.5 MG tablet Commonly known as:  ATIVAN Take 1 tablet (0.5 mg total) by mouth 2 (two) times daily. What changed:    when to take this  reasons to take this   Magnesium 250 MG Tabs Take 250 mg daily by mouth.   metoprolol succinate 100 MG 24 hr tablet Commonly known as:  TOPROL-XL Take 1 tablet (100 mg total) by mouth daily. Take with or immediately following a meal.   olmesartan 40 MG tablet Commonly known as:  BENICAR TAKE 1 TABLET DAILY   ondansetron 4 MG disintegrating tablet Commonly known as:  ZOFRAN ODT Take 1 tablet (4 mg total) by mouth every 8 (eight) hours as needed for nausea or vomiting.   pantoprazole 40 MG tablet Commonly known as:  PROTONIX Take 1 tablet (40 mg total) by mouth daily.   potassium chloride 10 MEQ tablet Commonly known as:  K-DUR Take 1 tablet (10 mEq total) by mouth daily.   VITAMIN D3 PO Take 1 capsule daily by mouth.       Major procedures and Radiology Reports - PLEASE review detailed and final reports for all details, in brief -    Dg Chest 2 View  Result Date: 01/09/2018 CLINICAL DATA:  Weakness with chest tightness EXAM: CHEST - 2 VIEW COMPARISON:  10/14/2016 FINDINGS: Diffuse coarse interstitial opacity with slight asymmetric prominence at the medial left base but similar compared to prior and likely due to chronic interstitial changes. No acute consolidation or pleural effusion. Mild cardiomegaly with aortic atherosclerosis. No pneumothorax. IMPRESSION: 1. Diffuse coarse interstitial opacity similar compared to prior and felt consistent with  chronic fibro interstitial changes 2. Mild cardiomegaly Electronically Signed   By: Donavan Foil M.D.   On: 01/09/2018 19:10    Micro Results    Recent Results (from the past 240 hour(s))  Culture, Urine     Status: Abnormal   Collection Time: 01/09/18  9:59 PM  Result Value Ref Range Status   Specimen Description   Final    URINE, CLEAN CATCH Performed at [facility], [address], [place], [place] [address]    Special Requests   Final    NONE Performed at [facility], [address] [place], [place] [address]    Culture 40,000 COLONIES/mL CORYNEBACTERIUM SPECIES (A)  Final   Report Status 01/11/2018 FINAL  Final       Today   Subjective    Kara Dean today has no new concerns, no chest pains, no dyspnea on exertion, no shortness of breath, no fever no chills no dysuria,           Patient has been seen and examined prior to discharge   Objective   Blood pressure (!) 132/52, pulse (!) 53, temperature 98.7 F (37.1 C), temperature source Oral, resp. rate 19, height 5' (1.524 m), weight 56.3 kg (124 lb 1.9 oz), SpO2 97 %.   Intake/Output Summary (Last 24 hours) at 01/11/2018 1649 Last data filed at 01/11/2018 1300 Gross per 24 hour  Intake 967.33 ml  Output 2 ml  Net 965.33 ml    Exam Gen:- Awake  In no apparent distress  HEENT:- [place].AT,   Neck-Supple Neck,No JVD,  Lungs- mostly clear ,  no wheezing CV- S1, S2 normal Abd-  +ve B.Sounds, Abd Soft, No tenderness, no CVA area tenderness Extremity/Skin:- Intact peripheral pulses   Psych-appropriate affect   Data Review   CBC w Diff:  Lab Results  Component Value Date   WBC 8.0 01/10/2018   HGB 12.0 01/10/2018   HGB 12.3 07/26/2015   HCT 35.4 (L) 01/10/2018   HCT 36.2 07/26/2015   PLT 215 01/10/2018   PLT 235 07/26/2015   LYMPHOPCT 11 01/09/2018   MONOPCT 10 01/09/2018   EOSPCT 1 01/09/2018   BASOPCT 0 01/09/2018    CMP:  Lab Results  Component Value Date   NA 132 (L) 01/10/2018   NA 141  06/17/2017   K 4.1 01/10/2018   CL 99 (L) 01/10/2018   CO2 25 01/10/2018   BUN 10 01/10/2018   BUN 14 06/17/2017   CREATININE 0.94 01/10/2018   CREATININE 0.92 02/26/2013   PROT 6.7 01/10/2018   PROT 7.2 06/17/2017   ALBUMIN 3.6 01/10/2018   ALBUMIN 4.5 06/17/2017   BILITOT 0.3 01/10/2018   BILITOT 0.4 06/17/2017   ALKPHOS 58 01/10/2018   AST 20 01/10/2018   ALT 12 (L) 01/10/2018  .   Total Discharge time is about 33 minutes  Roxan Hockey M.D on 01/11/2018 at 4:49 PM  [facility]   Office  [phone]  Voice Recognition Viviann Spare dictation system was used to create this note, attempts have been made to correct errors. Please contact the author with questions and/or clarifications.

## 2018-01-11 NOTE — Discharge Instructions (Signed)
1) stop glipizide due to concerns about low blood sugars 2) decrease Lantus insulin to 34 units from 38 units daily due to concerns about low blood sugars 3) eat small frequent meals throughout the day and snacks to avoid low blood sugars 4) take the rest of your medications as prescribed 5)Your primary care doctor will help you decide whether to switch you from Aggrenox to Plavix if you continue to have chest pains from time to time

## 2018-01-11 NOTE — Progress Notes (Signed)
Pt remains a&ox4, ambulatory with minimal assist. There were no changes from am assessment. Discharge instruction reviewed with pt and granddaughter. Questions concerns denied.

## 2018-01-12 ENCOUNTER — Telehealth: Payer: Self-pay | Admitting: *Deleted

## 2018-01-12 NOTE — Telephone Encounter (Signed)
Call Completed and Appointment Scheduled: Yes, Date: 01/16/18 with [name], [place] Date of Discharge:01/11/18  Discharge Facility: [facility]  Principal Discharge Diagnosis: hyponatremia, cystitis  Patient and/or caregiver is knowledgeable of his/her condition(s) and treatment: Yes  MEDICATION RECONCILIATION Current medication list reviewed with patient:Yes  Outpatient Encounter Medications as of 01/12/2018  Medication Sig  . amLODipine (NORVASC) 10 MG tablet Take 1 tablet (10 mg total) by mouth daily.  Marland Kitchen amoxicillin (AMOXIL) 500 MG capsule Take 1 capsule (500 mg total) by mouth 3 (three) times daily for 5 days.  Marland Kitchen atorvastatin (LIPITOR) 10 MG tablet Take 1 tablet (10 mg total) by mouth daily.  . B Complex Vitamins (B COMPLEX-B12) TABS Take 1 tablet by mouth daily.    . Cholecalciferol (VITAMIN D3 PO) Take 1 capsule daily by mouth.  . citalopram (CELEXA) 20 MG tablet Take 1 tablet (20 mg total) by mouth daily.  Marland Kitchen dipyridamole-aspirin (AGGRENOX) 200-25 MG 12hr capsule Take 1 capsule by mouth 2 (two) times daily.  . fluticasone (FLONASE) 50 MCG/ACT nasal spray USE 2 SPRAYS IN EACH NOSTRIL ONCE DAILY.  . furosemide (LASIX) 20 MG tablet TAKE 1 TABLET DAILY (Patient taking differently: TAKE 1 TABLET DAILY AS NEEDED FOR FLUID)  . glucose blood (ONE TOUCH ULTRA TEST) test strip CHECK BLOOD SUGER UP TO 3 TIMES A DAY  . insulin glargine (LANTUS) 100 UNIT/ML injection Inject 0.34 mLs (34 Units total) into the skin every morning.  Marland Kitchen LORazepam (ATIVAN) 0.5 MG tablet Take 1 tablet (0.5 mg total) by mouth 2 (two) times daily. (Patient taking differently: Take 0.5 mg by mouth 2 (two) times daily as needed for anxiety or sleep. )  . Magnesium 250 MG TABS Take 250 mg daily by mouth.   . metoprolol succinate (TOPROL-XL) 100 MG 24 hr tablet Take 1 tablet (100 mg total) by mouth daily. Take with or immediately following a meal.  . olmesartan (BENICAR) 40 MG tablet TAKE 1  TABLET DAILY  . Omega-3 Fatty Acids (FISH OIL) 1000 MG CAPS Take 1 capsule by mouth daily.  . ondansetron (ZOFRAN ODT) 4 MG disintegrating tablet Take 1 tablet (4 mg total) by mouth every 8 (eight) hours as needed for nausea or vomiting.  . pantoprazole (PROTONIX) 40 MG tablet Take 1 tablet (40 mg total) by mouth daily.  . potassium chloride (K-DUR) 10 MEQ tablet Take 1 tablet (10 mEq total) by mouth daily.   No facility-administered encounter medications on file as of 01/12/2018.     Discharge Medications reviewed and reconciled with current medications.yes glipizide d/c due to hypogycemia  Patient is able to obtain needed medications:Yes  ACTIVITIES OF DAILY LIVING  Is the patient able to perform his/her own ADLs: Yes.    Patient is receiving home health services: No.  PATIENT EDUCATION Questions/Concerns Discussed: No concerns at this time

## 2018-01-16 ENCOUNTER — Ambulatory Visit (INDEPENDENT_AMBULATORY_CARE_PROVIDER_SITE_OTHER): Payer: Medicare Other | Admitting: Nurse Practitioner

## 2018-01-16 ENCOUNTER — Encounter: Payer: Self-pay | Admitting: Nurse Practitioner

## 2018-01-16 VITALS — BP 132/52 | HR 53 | Temp 98.2°F | Ht 60.0 in | Wt 124.0 lb

## 2018-01-16 DIAGNOSIS — Z09 Encounter for follow-up examination after completed treatment for conditions other than malignant neoplasm: Secondary | ICD-10-CM | POA: Diagnosis not present

## 2018-01-16 DIAGNOSIS — E871 Hypo-osmolality and hyponatremia: Secondary | ICD-10-CM | POA: Diagnosis not present

## 2018-01-16 DIAGNOSIS — N3090 Cystitis, unspecified without hematuria: Secondary | ICD-10-CM | POA: Diagnosis not present

## 2018-01-16 NOTE — Progress Notes (Signed)
   Subjective:    Patient ID: Kara Dean, female    DOB: Nov 08, 1932, 84 y.o.   MRN: [id number]  HPI Patient comes in today for hospital follow up. She came in office on 01/08/18 with gastroenteritis and was given zofran and fluid challenge. She tolerated well and was sent home. Later that night she worsened and had to go to ED. Had to be admitted for hyponatremia and acute cystitis. She was discharged on 01/11/18. Since discharge She says she is gradually getting her strength back. Denies any nausea or vomiting. They stopped her glipizide and decreased her lantus to 34 units nightly from 38u nightly.  Blood sugars have been running around 120-130. No hypoglyemia.  Review of Systems  Constitutional: Negative for activity change and appetite change.  HENT: Negative.   Eyes: Negative for pain.  Respiratory: Negative for shortness of breath.   Cardiovascular: Negative for chest pain, palpitations and leg swelling.  Gastrointestinal: Negative for abdominal pain.  Endocrine: Negative for polydipsia.  Genitourinary: Negative.   Skin: Negative for rash.  Neurological: Negative for dizziness, weakness and headaches.  Hematological: Does not bruise/bleed easily.  Psychiatric/Behavioral: Negative.   All other systems reviewed and are negative.      Objective:   Physical Exam  Constitutional: She is oriented to person, place, and time. She appears well-developed and well-nourished.  HENT:  Nose: Nose normal.  Mouth/Throat: Oropharynx is clear and moist.  Eyes: EOM are normal.  Neck: Trachea normal, normal range of motion and full passive range of motion without pain. Neck supple. No JVD present. Carotid bruit is not present. No thyromegaly present.  Cardiovascular: Normal rate, regular rhythm, normal heart sounds and intact distal pulses. Exam reveals no gallop and no friction rub.  No murmur heard. Pulmonary/Chest: Effort normal and breath sounds normal.  Abdominal: Soft. Bowel sounds are  normal. She exhibits no distension and no mass. There is no tenderness.  Musculoskeletal: Normal range of motion.  Lymphadenopathy:    She has no cervical adenopathy.  Neurological: She is alert and oriented to person, place, and time. She has normal reflexes.  Skin: Skin is warm and dry.  Psychiatric: She has a normal mood and affect. Her behavior is normal. Judgment and thought content normal.    BP (!) 132/52   Pulse (!) 53   Temp 98.2 F (36.8 C) (Oral)   Ht 5' (1.524 m)   Wt 124 lb (56.2 kg)   BMI 24.22 kg/m        Assessment & Plan:   1. Hyponatremia   2. Cystitis   3. Hospital discharge follow-up    Hospital records reviewed Force fluids Rest RTO prn Orders Placed This Encounter  Procedures  . [place], FNP

## 2018-01-17 LAB — CMP14+EGFR
A/G RATIO: 1.6 (ref 1.2–2.2)
ALT: 9 IU/L (ref 0–32)
AST: 16 IU/L (ref 0–40)
Albumin: 4.2 g/dL (ref 3.5–4.7)
Alkaline Phosphatase: 62 IU/L (ref 39–117)
BUN/Creatinine Ratio: 12 (ref 12–28)
BUN: 12 mg/dL (ref 8–27)
Bilirubin Total: 0.4 mg/dL (ref 0.0–1.2)
CALCIUM: 9.6 mg/dL (ref 8.7–10.3)
CO2: 25 mmol/L (ref 20–29)
Chloride: 95 mmol/L — ABNORMAL LOW (ref 96–106)
Creatinine, Ser: 1.04 mg/dL — ABNORMAL HIGH (ref 0.57–1.00)
GFR, EST AFRICAN AMERICAN: 58 mL/min/{1.73_m2} — AB (ref >59)
GFR, EST NON AFRICAN AMERICAN: 50 mL/min/{1.73_m2} — AB (ref >59)
GLOBULIN, TOTAL: 2.7 g/dL (ref 1.5–4.5)
Glucose: 108 mg/dL — ABNORMAL HIGH (ref 65–99)
POTASSIUM: 4.7 mmol/L (ref 3.5–5.2)
SODIUM: 134 mmol/L (ref 134–144)
TOTAL PROTEIN: 6.9 g/dL (ref 6.0–8.5)

## 2018-01-21 ENCOUNTER — Other Ambulatory Visit: Payer: Self-pay | Admitting: Nurse Practitioner

## 2018-01-21 DIAGNOSIS — I2510 Atherosclerotic heart disease of native coronary artery without angina pectoris: Secondary | ICD-10-CM

## 2018-01-21 DIAGNOSIS — I10 Essential (primary) hypertension: Secondary | ICD-10-CM

## 2018-02-05 DIAGNOSIS — L84 Corns and callosities: Secondary | ICD-10-CM | POA: Diagnosis not present

## 2018-02-05 DIAGNOSIS — E1142 Type 2 diabetes mellitus with diabetic polyneuropathy: Secondary | ICD-10-CM | POA: Diagnosis not present

## 2018-02-05 DIAGNOSIS — M79676 Pain in unspecified toe(s): Secondary | ICD-10-CM | POA: Diagnosis not present

## 2018-02-05 DIAGNOSIS — B351 Tinea unguium: Secondary | ICD-10-CM | POA: Diagnosis not present

## 2018-02-19 ENCOUNTER — Other Ambulatory Visit: Payer: Self-pay | Admitting: Nurse Practitioner

## 2018-02-19 DIAGNOSIS — E1142 Type 2 diabetes mellitus with diabetic polyneuropathy: Secondary | ICD-10-CM

## 2018-02-19 DIAGNOSIS — Z794 Long term (current) use of insulin: Secondary | ICD-10-CM

## 2018-02-19 DIAGNOSIS — K219 Gastro-esophageal reflux disease without esophagitis: Secondary | ICD-10-CM

## 2018-03-21 ENCOUNTER — Other Ambulatory Visit: Payer: Self-pay | Admitting: Nurse Practitioner

## 2018-03-21 DIAGNOSIS — I10 Essential (primary) hypertension: Secondary | ICD-10-CM

## 2018-03-25 ENCOUNTER — Encounter: Payer: Self-pay | Admitting: Nurse Practitioner

## 2018-03-25 ENCOUNTER — Ambulatory Visit (INDEPENDENT_AMBULATORY_CARE_PROVIDER_SITE_OTHER): Payer: Medicare Other | Admitting: Nurse Practitioner

## 2018-03-25 VITALS — BP 146/54 | HR 49 | Temp 97.7°F | Ht 60.0 in | Wt 121.0 lb

## 2018-03-25 DIAGNOSIS — L989 Disorder of the skin and subcutaneous tissue, unspecified: Secondary | ICD-10-CM

## 2018-03-25 NOTE — Progress Notes (Signed)
   Subjective:    Patient ID: Kara Dean, female    DOB: 1933-03-05, 84 y.o.   MRN: [id number]   Chief Complaint: Sore on left lower leg   HPI Patient is brought in by her daughter with c/o nonhealing wound to left lower leg. It as been there over a year and will not heal. It is red and draning. Has gotten some bigger.   Review of Systems  Constitutional: Negative.   Respiratory: Negative.   Cardiovascular: Negative.   Skin: Positive for wound (left lower leg).  Neurological: Negative.   Psychiatric/Behavioral: Negative.   All other systems reviewed and are negative.      Objective:   Physical Exam  Constitutional: She is oriented to person, place, and time. She appears well-developed and well-nourished.  Cardiovascular: Normal rate.  Pulmonary/Chest: Effort normal.  Neurological: She is alert and oriented to person, place, and time.  Erythematous maculopapular lesion to left lower leg  Skin: Skin is warm.     BP (!) 146/54   Pulse (!) 49   Temp 97.7 F (36.5 C) (Oral)   Ht 5' (1.524 m)   Wt 121 lb (54.9 kg)   BMI 23.63 kg/m         Assessment & Plan:  [name] in today with chief complaint of Sore on left lower leg   1. Skin lesion of left leg Do not pick or scratch lesion Orders Placed This Encounter  Procedures  . Ambulatory referral to Dermatology    Referral Priority:   Routine    Referral Type:   Consultation    Referral Reason:   Specialty Services Required    Requested Specialty:   Dermatology    Number of Visits Requested:   [place],

## 2018-03-27 ENCOUNTER — Telehealth: Payer: Self-pay | Admitting: Nurse Practitioner

## 2018-03-27 NOTE — Telephone Encounter (Signed)
Patients daughter states that her every time she walks her leg swells and then it starts draining blood and is really painful. Daughter advised to take to Urgent care. Patients daughter also advised to dress the wound with non adherent bandage and to change at least daily. Patients granddaughter who is a trauma nurse is coming to look at it tonight for patient.

## 2018-04-16 DIAGNOSIS — E1142 Type 2 diabetes mellitus with diabetic polyneuropathy: Secondary | ICD-10-CM | POA: Diagnosis not present

## 2018-04-16 DIAGNOSIS — L84 Corns and callosities: Secondary | ICD-10-CM | POA: Diagnosis not present

## 2018-04-16 DIAGNOSIS — M79676 Pain in unspecified toe(s): Secondary | ICD-10-CM | POA: Diagnosis not present

## 2018-04-16 DIAGNOSIS — B351 Tinea unguium: Secondary | ICD-10-CM | POA: Diagnosis not present

## 2018-04-22 ENCOUNTER — Other Ambulatory Visit: Payer: Self-pay | Admitting: Nurse Practitioner

## 2018-04-22 DIAGNOSIS — C44729 Squamous cell carcinoma of skin of left lower limb, including hip: Secondary | ICD-10-CM | POA: Diagnosis not present

## 2018-04-22 DIAGNOSIS — C4492 Squamous cell carcinoma of skin, unspecified: Secondary | ICD-10-CM

## 2018-04-22 DIAGNOSIS — E7800 Pure hypercholesterolemia, unspecified: Secondary | ICD-10-CM

## 2018-04-22 DIAGNOSIS — F3342 Major depressive disorder, recurrent, in full remission: Secondary | ICD-10-CM

## 2018-04-22 DIAGNOSIS — C44311 Basal cell carcinoma of skin of nose: Secondary | ICD-10-CM | POA: Diagnosis not present

## 2018-04-22 DIAGNOSIS — C44629 Squamous cell carcinoma of skin of left upper limb, including shoulder: Secondary | ICD-10-CM | POA: Diagnosis not present

## 2018-04-22 DIAGNOSIS — C4491 Basal cell carcinoma of skin, unspecified: Secondary | ICD-10-CM

## 2018-04-22 DIAGNOSIS — R609 Edema, unspecified: Secondary | ICD-10-CM

## 2018-04-22 HISTORY — DX: Basal cell carcinoma of skin, unspecified: C44.91

## 2018-04-22 HISTORY — DX: Squamous cell carcinoma of skin, unspecified: C44.92

## 2018-04-22 NOTE — Telephone Encounter (Signed)
Last refill without being seen 

## 2018-04-29 DIAGNOSIS — L089 Local infection of the skin and subcutaneous tissue, unspecified: Secondary | ICD-10-CM | POA: Diagnosis not present

## 2018-05-14 ENCOUNTER — Other Ambulatory Visit: Payer: Self-pay | Admitting: Nurse Practitioner

## 2018-05-14 DIAGNOSIS — K219 Gastro-esophageal reflux disease without esophagitis: Secondary | ICD-10-CM

## 2018-05-14 DIAGNOSIS — R609 Edema, unspecified: Secondary | ICD-10-CM

## 2018-05-14 DIAGNOSIS — I2510 Atherosclerotic heart disease of native coronary artery without angina pectoris: Secondary | ICD-10-CM

## 2018-06-13 ENCOUNTER — Other Ambulatory Visit: Payer: Self-pay | Admitting: Nurse Practitioner

## 2018-06-13 DIAGNOSIS — R609 Edema, unspecified: Secondary | ICD-10-CM

## 2018-06-19 ENCOUNTER — Other Ambulatory Visit: Payer: Self-pay | Admitting: Nurse Practitioner

## 2018-06-19 DIAGNOSIS — I10 Essential (primary) hypertension: Secondary | ICD-10-CM

## 2018-06-19 NOTE — Telephone Encounter (Signed)
Last seen 03/25/18  MMM

## 2018-06-25 DIAGNOSIS — L84 Corns and callosities: Secondary | ICD-10-CM | POA: Diagnosis not present

## 2018-06-25 DIAGNOSIS — E1142 Type 2 diabetes mellitus with diabetic polyneuropathy: Secondary | ICD-10-CM | POA: Diagnosis not present

## 2018-06-25 DIAGNOSIS — B351 Tinea unguium: Secondary | ICD-10-CM | POA: Diagnosis not present

## 2018-06-25 DIAGNOSIS — M79676 Pain in unspecified toe(s): Secondary | ICD-10-CM | POA: Diagnosis not present

## 2018-07-21 ENCOUNTER — Ambulatory Visit (INDEPENDENT_AMBULATORY_CARE_PROVIDER_SITE_OTHER): Payer: Medicare Other | Admitting: Nurse Practitioner

## 2018-07-21 ENCOUNTER — Ambulatory Visit (INDEPENDENT_AMBULATORY_CARE_PROVIDER_SITE_OTHER): Payer: Medicare Other

## 2018-07-21 ENCOUNTER — Encounter: Payer: Self-pay | Admitting: Nurse Practitioner

## 2018-07-21 VITALS — BP 149/53 | HR 50 | Temp 98.1°F | Ht 60.0 in | Wt 123.0 lb

## 2018-07-21 DIAGNOSIS — E119 Type 2 diabetes mellitus without complications: Secondary | ICD-10-CM | POA: Diagnosis not present

## 2018-07-21 DIAGNOSIS — Z794 Long term (current) use of insulin: Secondary | ICD-10-CM

## 2018-07-21 DIAGNOSIS — M25552 Pain in left hip: Secondary | ICD-10-CM

## 2018-07-21 DIAGNOSIS — F411 Generalized anxiety disorder: Secondary | ICD-10-CM

## 2018-07-21 DIAGNOSIS — E7800 Pure hypercholesterolemia, unspecified: Secondary | ICD-10-CM

## 2018-07-21 DIAGNOSIS — R609 Edema, unspecified: Secondary | ICD-10-CM

## 2018-07-21 DIAGNOSIS — F3342 Major depressive disorder, recurrent, in full remission: Secondary | ICD-10-CM

## 2018-07-21 DIAGNOSIS — N183 Chronic kidney disease, stage 3 (moderate): Secondary | ICD-10-CM

## 2018-07-21 DIAGNOSIS — I10 Essential (primary) hypertension: Secondary | ICD-10-CM | POA: Diagnosis not present

## 2018-07-21 DIAGNOSIS — M1612 Unilateral primary osteoarthritis, left hip: Secondary | ICD-10-CM | POA: Diagnosis not present

## 2018-07-21 DIAGNOSIS — I6930 Unspecified sequelae of cerebral infarction: Secondary | ICD-10-CM

## 2018-07-21 DIAGNOSIS — I2510 Atherosclerotic heart disease of native coronary artery without angina pectoris: Secondary | ICD-10-CM

## 2018-07-21 DIAGNOSIS — E785 Hyperlipidemia, unspecified: Secondary | ICD-10-CM | POA: Diagnosis not present

## 2018-07-21 DIAGNOSIS — N1831 Chronic kidney disease, stage 3a: Secondary | ICD-10-CM

## 2018-07-21 DIAGNOSIS — K219 Gastro-esophageal reflux disease without esophagitis: Secondary | ICD-10-CM

## 2018-07-21 LAB — BAYER DCA HB A1C WAIVED: HB A1C (BAYER DCA - WAIVED): 6.6 % (ref <7.0)

## 2018-07-21 MED ORDER — ASPIRIN-DIPYRIDAMOLE ER 25-200 MG PO CP12: ORAL_CAPSULE | ORAL | 1 refills | Status: DC

## 2018-07-21 MED ORDER — AMLODIPINE BESYLATE 10 MG PO TABS: 10.0000 mg | ORAL_TABLET | Freq: Every day | ORAL | 1 refills | Status: DC

## 2018-07-21 MED ORDER — INSULIN GLARGINE 100 UNIT/ML ~~LOC~~ SOLN: 34.0000 [IU] | Freq: Every morning | SUBCUTANEOUS | 5 refills | Status: DC

## 2018-07-21 MED ORDER — ATORVASTATIN CALCIUM 10 MG PO TABS: 10.0000 mg | ORAL_TABLET | Freq: Every day | ORAL | 1 refills | Status: DC

## 2018-07-21 MED ORDER — METOPROLOL SUCCINATE ER 100 MG PO TB24: ORAL_TABLET | ORAL | 1 refills | Status: DC

## 2018-07-21 MED ORDER — PANTOPRAZOLE SODIUM 40 MG PO TBEC: 40.0000 mg | DELAYED_RELEASE_TABLET | Freq: Every day | ORAL | 1 refills | Status: DC

## 2018-07-21 MED ORDER — FUROSEMIDE 20 MG PO TABS: 20.0000 mg | ORAL_TABLET | Freq: Every day | ORAL | 1 refills | Status: DC

## 2018-07-21 MED ORDER — LORAZEPAM 0.5 MG PO TABS: ORAL_TABLET | ORAL | 2 refills | Status: DC

## 2018-07-21 MED ORDER — OLMESARTAN MEDOXOMIL 40 MG PO TABS: 40.0000 mg | ORAL_TABLET | Freq: Every day | ORAL | 1 refills | Status: DC

## 2018-07-21 MED ORDER — CITALOPRAM HYDROBROMIDE 20 MG PO TABS: 20.0000 mg | ORAL_TABLET | Freq: Every day | ORAL | 1 refills | Status: DC

## 2018-07-21 MED ORDER — POTASSIUM CHLORIDE ER 10 MEQ PO TBCR: 10.0000 meq | EXTENDED_RELEASE_TABLET | Freq: Every day | ORAL | 1 refills | Status: DC

## 2018-07-21 NOTE — Addendum Note (Signed)
Addended by: [name] on: 07/21/2018 04:37 PM   Modules accepted: Orders

## 2018-07-21 NOTE — Progress Notes (Addendum)
Subjective:    Patient ID: Kara Dean, female    DOB: 1933/07/02, 84 y.o.   MRN: [id number]   Chief Complaint: Medical Management of Chronic Issues (Eyes are blurry at times)   HPI:  1. Essential hypertension  No c/o chest pain, sob or headache. Does not check bloodpressures at h ome. BP Readings from Last 3 Encounters:  07/21/18 (!) 149/53  03/25/18 (!) 146/54  01/16/18 (!) 132/52     2. Gastroesophageal reflux disease without esophagitis  Patient takes protonix daily and says keeps symptoms under control.  3. Type 2 diabetes mellitus treated with insulin ([place]) Last hgba1c was 6.6. Checks blood sugars daily- fasting around 140-150.   4. Chronic kidney disease (CKD) stage G3a/A3, moderately decreased glomerular filtration rate (GFR) between 45-59 mL/min/1.73 square meter and albuminuria creatinine ratio greater than 300 mg/g (HCC) no trouble voiding- we are currently just watching labs  5. Peripheral edema  Has swelling daily that resolves at night.  6. Hyperlipidemia, unspecified hyperlipidemia type  Does not really watch diet - eats whatever she wants to.  7. Recurrent major depressive disorder, in full remission ([place])  She is on celexa and works well for her. Depression screen [facility] 2/9 07/21/2018 03/25/2018 01/08/2018 10/07/2017 06/17/2017  Decreased Interest 2 0 _0 Down, Depressed, Hopeless 3 0 _1 PHQ - 2 Score 5 0 _2 Altered sleeping 0 - _3 Tired, decreased energy 3 - _4 Change in appetite 2 - _5 Feeling bad or failure about yourself  2 - _6 Trouble concentrating 3 - 3 3 0  Moving slowly or fidgety/restless 1 - 0 0 0  Suicidal thoughts 0 - 0 0 0  PHQ-9 Score 16 - _7 Difficult doing work/chores - - - - -  Some recent data might be hidden     8. GAD (generalized anxiety disorder)  Stays stressed and worries about her family. Takes ativan BID which helps  9. Late effect of cerebrovascular accident (CVA)  No permanent affects form  stroke.  +  Outpatient Encounter Medications as of 07/21/2018  Medication Sig  . amLODipine (NORVASC) 10 MG tablet Take 1 tablet (10 mg total) by mouth daily.  Marland Kitchen atorvastatin (LIPITOR) 10 MG tablet TAKE 1 TABLET DAILY  . B Complex Vitamins (B COMPLEX-B12) TABS Take 1 tablet by mouth daily.    . Cholecalciferol (VITAMIN D3 PO) Take 1 capsule daily by mouth.  . citalopram (CELEXA) 20 MG tablet TAKE 1 TABLET DAILY  . dipyridamole-aspirin (AGGRENOX) 200-25 MG 12hr capsule TAKE  (1)  CAPSULE  TWICE DAILY.  . fluticasone (FLONASE) 50 MCG/ACT nasal spray USE 2 SPRAYS IN EACH NOSTRIL ONCE DAILY.  . furosemide (LASIX) 20 MG tablet TAKE 1 TABLET DAILY  . glucose blood (ONE TOUCH ULTRA TEST) test strip CHECK BLOOD SUGER UP TO 3 TIMES A DAY  . insulin glargine (LANTUS) 100 UNIT/ML injection Inject 0.34 mLs (34 Units total) into the skin every morning.  Marland Kitchen LORazepam (ATIVAN) 0.5 MG tablet TAKE (1) TABLET TWICE A DAY.  . Magnesium 250 MG TABS Take 250 mg daily by mouth.   . metoprolol succinate (TOPROL-XL) 100 MG 24 hr tablet TAKE 1 TABLET ONCE A DAY WITH A MEAL  . olmesartan (BENICAR) 40 MG tablet TAKE 1 TABLET DAILY  . Omega-3 Fatty Acids (FISH OIL) 1000 MG CAPS Take 1 capsule by mouth daily.  Marland Kitchen  ondansetron (ZOFRAN ODT) 4 MG disintegrating tablet Take 1 tablet (4 mg total) by mouth every 8 (eight) hours as needed for nausea or vomiting.  . pantoprazole (PROTONIX) 40 MG tablet TAKE 1 TABLET DAILY  . potassium chloride (K-DUR) 10 MEQ tablet TAKE 1 TABLET DAILY      New complaints: Having blurred vision since had cataract surgery- will follow up with eye surgeon. C/o left hip pain- started several months ago- has gotten worse. Rated 8/10 currently. Walking and standing makes it worse. Rest helps some. Social history: Lives by herself.daughter checks on her daily and she has an aide that comes a couple of hours a week.   Review of Systems  Constitutional: Negative for activity change and appetite  change.  HENT: Negative.   Eyes: Negative for pain.  Respiratory: Negative for shortness of breath.   Cardiovascular: Negative for chest pain, palpitations and leg swelling.  Gastrointestinal: Negative for abdominal pain.  Endocrine: Negative for polydipsia.  Genitourinary: Negative.   Skin: Negative for rash.  Neurological: Negative for dizziness, weakness and headaches.  Hematological: Does not bruise/bleed easily.  Psychiatric/Behavioral: Negative.   All other systems reviewed and are negative.      Objective:   Physical Exam  Constitutional: She is oriented to person, place, and time. She appears well-developed and well-nourished. No distress.  HENT:  Head: Normocephalic.  Nose: Nose normal.  Mouth/Throat: Oropharynx is clear and moist.  Eyes: Pupils are equal, round, and reactive to light. EOM are normal.  Neck: Normal range of motion. Neck supple. No JVD present. Carotid bruit is not present.  Cardiovascular: Normal rate, regular rhythm, normal heart sounds and intact distal pulses.  Pulmonary/Chest: Effort normal and breath sounds normal. No respiratory distress. She has no wheezes. She has no rales. She exhibits no tenderness.  Abdominal: Soft. Normal appearance, normal aorta and bowel sounds are normal. She exhibits no distension, no abdominal bruit, no pulsatile midline mass and no mass. There is no splenomegaly or hepatomegaly. There is no tenderness.  Musculoskeletal: Normal range of motion. She exhibits no edema.  FROM of left hip without pain Hammer toes on bil feet 2,3,and 4 toes  Lymphadenopathy:    She has no cervical adenopathy.  Neurological: She is alert and oriented to person, place, and time. She has normal reflexes.  Skin: Skin is warm and dry.  Psychiatric: She has a normal mood and affect. Her behavior is normal. Judgment and thought content normal.  Nursing note and vitals reviewed.  BP (!) 149/53   Pulse (!) 50   Temp 98.1 F (36.7 C) (Oral)   Ht  5' (1.524 m)   Wt 123 lb (55.8 kg)   BMI 24.02 kg/m   Left hip- mild osteoarthritis-Preliminary reading by Ronnald Collum, FNP  [facility] hgba1c 6.6%     Assessment & Plan:  ROBINN OVERHOLT comes in today with chief complaint of Medical Management of Chronic Issues (Eyes are blurry at times)   Diagnosis and orders addressed:  1. Essential hypertension Low sodium diet - CMP14+EGFR - potassium chloride (K-DUR) 10 MEQ tablet; Take 1 tablet (10 mEq total) by mouth daily.  Dispense: 90 tablet; Refill: 1 - olmesartan (BENICAR) 40 MG tablet; Take 1 tablet (40 mg total) by mouth daily.  Dispense: 90 tablet; Refill: 1 - amLODipine (NORVASC) 10 MG tablet; Take 1 tablet (10 mg total) by mouth daily.  Dispense: 30 tablet; Refill: 1  2. Gastroesophageal reflux disease without esophagitis Avoid spicy foods Do not eat 2 hours  prior to bedtime - pantoprazole (PROTONIX) 40 MG tablet; Take 1 tablet (40 mg total) by mouth daily.  Dispense: 90 tablet; Refill: 1  3. Type 2 diabetes mellitus treated with insulin (HCC) Continue to watch carbs in diet - Bayer DCA Hb A1c Waived - Microalbumin / creatinine urine ratio - insulin glargine (LANTUS) 100 UNIT/ML injection; Inject 0.34 mLs (34 Units total) into the skin every morning.  Dispense: 30 mL; Refill: 5  4. Chronic kidney disease (CKD) stage G3a/A3, moderately decreased glomerular filtration rate (GFR) between 45-59 mL/min/1.73 square meter and albuminuria creatinine ratio greater than 300 mg/g (HCC) Labs pending  5. Peripheral edema Elevate legs when sitting - furosemide (LASIX) 20 MG tablet; Take 1 tablet (20 mg total) by mouth daily.  Dispense: 90 tablet; Refill: 1  6. Hyperlipidemia, unspecified hyperlipidemia type Low fat diet - Lipid panel  7. Recurrent major depressive disorder, in full remission ([place]) Stress management - citalopram (CELEXA) 20 MG tablet; Take 1 tablet (20 mg total) by mouth daily.  Dispense: 90 tablet; Refill: 1  8. GAD  (generalized anxiety disorder) - LORazepam (ATIVAN) 0.5 MG tablet; TAKE (1) TABLET TWICE A DAY.  Dispense: 60 tablet; Refill: 2  9. Late effect of cerebrovascular accident (CVA) Fall prevention  10. Coronary artery disease involving native coronary artery of native heart without angina pectoris - dipyridamole-aspirin (AGGRENOX) 200-25 MG 12hr capsule; TAKE  (1)  CAPSULE  TWICE DAILY.  Dispense: 180 capsule; Refill: 1 - metoprolol succinate (TOPROL-XL) 100 MG 24 hr tablet; TAKE 1 TABLET ONCE A DAY WITH A MEAL  Dispense: 90 tablet; Refill: 1  11. Pure hypercholesterolemia - atorvastatin (LIPITOR) 10 MG tablet; Take 1 tablet (10 mg total) by mouth daily.  Dispense: 90 tablet; Refill: 1  12. Left hip pain Rest Ice bid Extra strength tylenol - DG HIP UNILAT W OR W/O PELVIS 2-3 VIEWS LEFT; Future  * see eye specialist for visual changes  Labs pending Health Maintenance reviewed Diet and exercise encouraged  Follow up plan: 3 months   [name], FNP

## 2018-07-21 NOTE — Patient Instructions (Signed)
Hip Pain The hip is the joint between the upper legs and the lower pelvis. The bones, cartilage, tendons, and muscles of your hip joint support your body and allow you to move around. Hip pain can range from a minor ache to severe pain in one or both of your hips. The pain may be felt on the inside of the hip joint near the groin, or the outside near the buttocks and upper thigh. You may also have swelling or stiffness. Follow these instructions at home: Managing pain, stiffness, and swelling  If directed, apply ice to the injured area. ? Put ice in a plastic bag. ? Place a towel between your skin and the bag. ? Leave the ice on for 20 minutes, 2-3 times a day  Sleep with a pillow between your legs on your most comfortable side.  Avoid any activities that cause pain. General instructions  Take over-the-counter and prescription medicines only as told by your health care provider.  Do any exercises as told by your health care provider.  Record the following: ? How often you have hip pain. ? The location of your pain. ? What the pain feels like. ? What makes the pain worse.  Keep all follow-up visits as told by your health care provider. This is important. Contact a health care provider if:  You cannot put weight on your leg.  Your pain or swelling continues or gets worse after one week.  It gets harder to walk.  You have a fever. Get help right away if:  You fall.  You have a sudden increase in pain and swelling in your hip.  Your hip is red or swollen or very tender to touch. Summary  Hip pain can range from a minor ache to severe pain in one or both of your hips.  The pain may be felt on the inside of the hip joint near the groin, or the outside near the buttocks and upper thigh.  Avoid any activities that cause pain.  Record how often you have hip pain, the location of the pain, what makes it worse and what it feels like. This information is not intended to  replace advice given to you by your health care provider. Make sure you discuss any questions you have with your health care provider. Document Released: 04/10/2010 Document Revised: 09/23/2016 Document Reviewed: 09/23/2016 Elsevier Interactive Patient Education  [employer].

## 2018-07-22 LAB — LIPID PANEL
CHOL/HDL RATIO: 2.8 ratio (ref 0.0–4.4)
Cholesterol, Total: 110 mg/dL (ref 100–199)
HDL: 39 mg/dL — ABNORMAL LOW (ref >39)
LDL CALC: 40 mg/dL (ref 0–99)
Triglycerides: 153 mg/dL — ABNORMAL HIGH (ref 0–149)
VLDL Cholesterol Cal: 31 mg/dL (ref 5–40)

## 2018-07-22 LAB — CMP14+EGFR
A/G RATIO: 1.7 (ref 1.2–2.2)
ALT: 8 IU/L (ref 0–32)
AST: 12 IU/L (ref 0–40)
Albumin: 4.2 g/dL (ref 3.5–4.7)
Alkaline Phosphatase: 67 IU/L (ref 39–117)
BUN / CREAT RATIO: 13 (ref 12–28)
BUN: 15 mg/dL (ref 8–27)
Bilirubin Total: 0.4 mg/dL (ref 0.0–1.2)
CALCIUM: 9.7 mg/dL (ref 8.7–10.3)
CO2: 23 mmol/L (ref 20–29)
CREATININE: 1.15 mg/dL — AB (ref 0.57–1.00)
Chloride: 101 mmol/L (ref 96–106)
GFR calc Af Amer: 51 mL/min/{1.73_m2} — ABNORMAL LOW (ref >59)
GFR, EST NON AFRICAN AMERICAN: 44 mL/min/{1.73_m2} — AB (ref >59)
GLOBULIN, TOTAL: 2.5 g/dL (ref 1.5–4.5)
Glucose: 234 mg/dL — ABNORMAL HIGH (ref 65–99)
Potassium: 4 mmol/L (ref 3.5–5.2)
SODIUM: 140 mmol/L (ref 134–144)
Total Protein: 6.7 g/dL (ref 6.0–8.5)

## 2018-08-18 ENCOUNTER — Telehealth: Payer: Self-pay | Admitting: Nurse Practitioner

## 2018-08-18 NOTE — Telephone Encounter (Signed)
[facility] called and stated patient got Flu and pneumonia vaccine done yesterday there. Patient called back to [facility] today and told them the are she got the pneumovax 23 in was really swollen and red. [facility] was calling to let us know to see if we needed to see the patient or not. Please advise.

## 2018-08-18 NOTE — Telephone Encounter (Signed)
Spoke with patient's daughter,  She reports her mother had Pneumovax vaccine at [facility].  Now her arm is red, swollen and warm to touch about 8 inches in each direction around the injection site.  I advised that she be seen tomorrow, so an appointment was made with Dr. Lajuana Ripple at 11:15 am.

## 2018-08-19 ENCOUNTER — Ambulatory Visit (INDEPENDENT_AMBULATORY_CARE_PROVIDER_SITE_OTHER): Payer: Medicare Other | Admitting: Pediatrics

## 2018-08-19 ENCOUNTER — Encounter: Payer: Self-pay | Admitting: Pediatrics

## 2018-08-19 ENCOUNTER — Ambulatory Visit: Payer: Medicare Other | Admitting: Family Medicine

## 2018-08-19 VITALS — BP 134/65 | HR 60 | Temp 98.1°F | Ht 60.0 in | Wt 123.2 lb

## 2018-08-19 DIAGNOSIS — T50Z95A Adverse effect of other vaccines and biological substances, initial encounter: Secondary | ICD-10-CM

## 2018-08-19 DIAGNOSIS — M79622 Pain in left upper arm: Secondary | ICD-10-CM

## 2018-08-19 NOTE — Patient Instructions (Addendum)
Cool compresses to arm  Any worsening redness, pain, swelling let us know

## 2018-08-19 NOTE — Progress Notes (Signed)
  Subjective:   Patient ID: Kara Dean, female    DOB: Feb 21, 1933, 84 y.o.   MRN: [id number] CC: Redness (R arm) and Edema (r arm)  HPI: Kara Dean is a 84 y.o. female  Here today with aide  2 days ago received pneumovax23 in R arm, flu shot and left arm at local pharmacy.  Yesterday she started having redness around injection site on the right arm.  Daughter marked the redness with a pen.  Today the pain is better, arm still feels uncomfortable.  Still with some redness.  No swelling in her hands lower arms bilaterally.  She does not think she is ever had the Pneumovax immunization before.  No fever.  No itching in the site. No trouble breathing.  Relevant past medical, surgical, family and social history reviewed. Allergies and medications reviewed and updated. Social History   Tobacco Use  Smoking Status Never Smoker  Smokeless Tobacco Never Used   ROS: Per HPI   Objective:    BP 134/65   Pulse 60   Temp 98.1 F (36.7 C) (Oral)   Ht 5' (1.524 m)   Wt 123 lb 3.2 oz (55.9 kg)   BMI 24.06 kg/m   Wt Readings from Last 3 Encounters:  08/19/18 123 lb 3.2 oz (55.9 kg)  07/21/18 123 lb (55.8 kg)  03/25/18 121 lb (54.9 kg)     Gen: NAD, alert, cooperative with exam, NCAT EYES: EOMI, no conjunctival injection, or no icterus CV: NRRR, normal S1/S2, no murmur, distal pulses 2+ b/l Resp: CTABL, no wheezes, normal WOB Abd: +BS, soft, NTND. no guarding or organomegaly Ext: No edema, warm Neuro: Alert and oriented, strength equal b/l UE and LE, coordination grossly normal MSK: normal muscle bulk Skin: 13cm x 15cm pink patch, not well defined, R medial arm. Smaller than pen marks by 2-3 cm at prox, lateral marks.  Assessment & Plan:  Kara Dean was seen today for redness and edema.  Diagnoses and all orders for this visit:  Adverse effect of vaccine, initial encounter Redness around injection site for pneumovax23 starting within 24 hours of the injection.  improved today.   Remarked an outline of the redness in clinic. Rec cool compresses, tylenol prn.  Any worsening in symptoms including pain, swelling, redness, let us know  Has an aide who helps with cleaning, bathing. Comes twice a week. Pt to call company to confirm that she will be losing aide, see what next steps need to be done.  Follow up plan: Return if symptoms worsen or fail to improve. Assunta Found, MD [place]

## 2018-08-21 ENCOUNTER — Encounter: Payer: Medicare Other | Admitting: *Deleted

## 2018-09-03 DIAGNOSIS — B351 Tinea unguium: Secondary | ICD-10-CM | POA: Diagnosis not present

## 2018-09-03 DIAGNOSIS — M79676 Pain in unspecified toe(s): Secondary | ICD-10-CM | POA: Diagnosis not present

## 2018-09-03 DIAGNOSIS — E1142 Type 2 diabetes mellitus with diabetic polyneuropathy: Secondary | ICD-10-CM | POA: Diagnosis not present

## 2018-09-03 DIAGNOSIS — L84 Corns and callosities: Secondary | ICD-10-CM | POA: Diagnosis not present

## 2018-09-21 ENCOUNTER — Ambulatory Visit (INDEPENDENT_AMBULATORY_CARE_PROVIDER_SITE_OTHER): Payer: Medicare Other | Admitting: *Deleted

## 2018-09-21 ENCOUNTER — Encounter: Payer: Self-pay | Admitting: *Deleted

## 2018-09-21 VITALS — BP 155/56 | HR 54 | Ht 59.0 in | Wt 120.0 lb

## 2018-09-21 DIAGNOSIS — Z0000 Encounter for general adult medical examination without abnormal findings: Secondary | ICD-10-CM

## 2018-09-21 DIAGNOSIS — I2510 Atherosclerotic heart disease of native coronary artery without angina pectoris: Secondary | ICD-10-CM

## 2018-09-21 NOTE — Patient Instructions (Addendum)
[name] , Thank you for taking time to come for your Medicare Wellness Visit. I appreciate your ongoing commitment to your health goals. Please review the following plan we discussed and let me know if I can assist you in the future.   These are the goals we discussed: Goals    . Exercise 150 min/wk Moderate Activity     Chair exercises or walking daily for 30 minutes    . Prevent falls     Use a light when going to the bathroom at night        This is a list of the screening recommended for you and due dates:  Health Maintenance  Topic Date Due  . Eye exam for diabetics  07/15/2018  . Complete foot exam   10/07/2018  . Hemoglobin A1C  01/19/2019  . Tetanus Vaccine  08/15/2024  . Flu Shot  Completed  . DEXA scan (bone density measurement)  Completed  . Pneumonia vaccines  Completed   I will put in a request for an appointment with Dr Burt Knack and I recommend seeing your dermatologist every year for a skin check.  You can use Aveeno lotion (or generic) for dry skin. Also a cool mist humidifier and increasing your water intake will helps ome.  Empty bladder every 2 to 3 hours to decrease incontinence  8 Easy Exercises You Can Do Sitting Down  Got a chair? Then you're ready for this sit-down, total-body workout!.  Safety precaution: Pay attention to your body during the movements - if anything hurts or causes pain, stop immediately. And check with your doctor first before beginning this, or any, exercise program.   Sunshine Arm Circles Seated in a chair with good posture, hold a ball in both hands with arms extended above your head and/or in front of you, keeping elbows slightly bent. Visualizing the face of a clock out in front of you, begin by holding arms up overhead at 12 o'clock. Circle the ball around to go all the way around the clock in a controlled, fluid motion. When you've reached 12 o'clock again, reverse directions and circle the opposite way. Keep alternating  circle directions for 8 repetitions. Rest. Do another set of 8 repetitions.  Modification: A ball is not required for this exercise. Imagine that you are holding a ball while performing the motion. If it is difficult to bring your arms overhead, extend them out in front of you and move arms as if drawing a circle on the wall with or without the ball.   Tummy Twists Seated in a chair with good posture, hold a ball with both hands close to the body, with elbows bent and pulled in close to the ribcage. Slowly rotate your torso to the right as far as you comfortably can, being sure to keep the rest of your body still and stable. Rotate back to the center and repeat in the opposite direction. Do this 8 times, with two twists counting as a full set. Rest. Do another 8 sets (two twists each). Modification: A ball is not required for this exercise. Imagine you are holding a ball while performing the motion, or hold a small object such as a can of soup or water bottle to add resistance   Ball Chest Press Seated in a chair with good posture, hold a ball with both hands at chest level, palms facing toward each other and elbows bent. Avoid bending forward by keeping your shoulders back at all times. Squeeze the  ball slightly as you push the ball away from you in a fluid motion, taking about 2 seconds to extend the arms. Squeeze your shoulder blades together as you pull the ball back toward your chest. Repeat the push and pull motion 10 to 15 times. Rest. Do another set of 10 to 15 repetitions.  Modification: For a greater challenge, add a Tai Chi feel by standing with one leg slightly in front of the other (with a chair nearby if needed for extra balance) and slowly rocking the entire body forward and back as you push the ball away and pull back in.     Front Arm Raises In a seated position with good posture, hold a ball in both hands with palms facing each other. Extend the arms out in  front of your body, keeping your elbows slightly bent. Starting with the ball lowered toward the knees, slowly raise your arms to lift the ball up to shoulder level (no higher), then lower the ball back to the starting position, taking about 2 to 3 seconds to lift and lower. Repeat 10 to 15 times. Rest. Do another set of 10 to 15 repetitions. Modification: A ball is not required for this exercise. Imagine you are holding a ball as you perform the motion, or hold a small object, such as a can of soup or water bottle for added resistance.        Inner Thigh Squeezes Sitting toward the edge of a chair with good posture and knees bent, place a ball in between your knees; press the knees together to squeeze the ball, taking about 1 to 2 seconds to squeeze. You should feel the resistance in your inner thighs. Slowly release, keeping slight tension on the ball so that it does not fall. Repeat 8 to 10 times. Rest. Do another set of 8 to 10 repetitions. Modification: For a greater challenge, change the count of the squeezes by squeezing the ball and holding for 5 seconds, then releasing again. Or, do short, quick pulsing squeezes.     Knee Extensions Sitting toward the edge of a chair with good posture and bent knees, hold on to the sides of the chair with your hands. Extend the right knee out so that the toes come up toward the ceiling, being sure to keep the knee slightly bent without locking it through the entire movement. Lower the leg back to a bent position and repeat this movement 8 to 10 times, using about 2 seconds each to lift and lower the leg. Switch to the opposite leg and perform 8 to 10 repetitions. Rest briefly. Do another set of 8 to 10 repetitions for each leg. Modification: If you are more advanced, sitting in the same position as above, extend one leg out in front of you with toes pointed to the ceiling. Lift and lower the entire leg only as high as you comfortably can,  keeping the knee slightly bent. The longer lever adds difficulty to the exercise.   Elbow to Knee Seated toward the edge of a chair with good posture and knees bent, start with your right arm extended up overhead. Slowly lift the left knee up as you lower your right elbow down toward your left knee, taking about 2 seconds to lower down. Try not to bend over at the waist. Release and go back to the starting position. Repeat 8 to 10 times. Switch sides and do 8 to 10 repetitions, pulling one elbow to the opposite knee. Rest.  Do another set of 8 to 10 repetitions on each side. Modification: Try this (with a chair nearby for balance) exercise in a standing position for an increased range of motion.     Overhead Arm Extensions Seated in a chair with good posture, hold a ball with both hands and raise it up over your head, with arms extended without locking the elbows. Keeping the elbows pulled in toward the head, slowly bend the elbows to lower the ball down along the back of the neck, using about 2 seconds to go down, then 2 seconds to push the ball back up over your head. Repeat 8 to 10 times. Rest. Do another set of 8 to 10 repetitions. Modification: Try seated tricep extensions (ball not required for this modification). Bending slightly forward with elbows tucked into your sides, slowly extend the elbows so that your forearms go back behind you, keeping the elbows pulled up and in for the entire movement. Return to the starting position and repeat. Hold soup cans or small weights for added resistance.

## 2018-09-21 NOTE — Progress Notes (Addendum)
Subjective:   Kara Dean is a 84 y.o. female who presents for a subsequent Medicare Annual Wellness Visit. She is accompanied by her daughter.   Patient Care Team: Kara Pretty, FNP as PCP - General (Nurse Practitioner) Kara Mocha, MD as Consulting Physician (Cardiology) Kara Goldmann, MD as Consulting Physician (Ophthalmology) Kara Dean, OD (Optometry) Willette Pa Ronalee Red, PA-C as Physician Assistant (Dermatology)   Hospitalizations, surgeries, and ER visits in previous 12 months 01/09/18 ED to admission for hyponatremia. 10/10/17 cataract removal. No other hospitalizations, ED visits, or surgeries this past year.   Review of Systems    Patient reports that her overall health is worse compared to last year.  Cardiac Risk Factors include: advanced age (>108mn, >>5women);diabetes mellitus;dyslipidemia;hypertension;sedentary lifestyle. Needs f/u with Dr cBurt Knack Hasn't seen him in over 1 year. Carotid doppler in 05/2017 showed less than 40% blocked and recommended a f/u in 1 year.   Skin: Complains of dry skin. Had a skin cancer on lower leg that was removed. Recommended yearly f/u with dermatologist.  All other systems negative       Current Medications (verified) Outpatient Encounter Medications as of 09/21/2018  Medication Sig  . amLODipine (NORVASC) 10 MG tablet Take 1 tablet (10 mg total) by mouth daily.  .Marland Kitchenatorvastatin (LIPITOR) 10 MG tablet Take 1 tablet (10 mg total) by mouth daily.  . B Complex Vitamins (B COMPLEX-B12) TABS Take 1 tablet by mouth daily.    . Cholecalciferol (VITAMIN D3 PO) Take 1 capsule daily by mouth.  . citalopram (CELEXA) 20 MG tablet Take 1 tablet (20 mg total) by mouth daily.  .Marland Kitchendipyridamole-aspirin (AGGRENOX) 200-25 MG 12hr capsule TAKE  (1)  CAPSULE  TWICE DAILY.  . fluticasone (FLONASE) 50 MCG/ACT nasal spray USE 2 SPRAYS IN EACH NOSTRIL ONCE DAILY.  . furosemide (LASIX) 20 MG tablet Take 1 tablet (20 mg total) by mouth  daily.  .Marland Kitchenglucose blood (ONE TOUCH ULTRA TEST) test strip CHECK BLOOD SUGER UP TO 3 TIMES A DAY  . insulin glargine (LANTUS) 100 UNIT/ML injection Inject 0.34 mLs (34 Units total) into the skin every morning.  .Marland KitchenLORazepam (ATIVAN) 0.5 MG tablet TAKE (1) TABLET TWICE A DAY.  . Magnesium 250 MG TABS Take 250 mg daily by mouth.   . metoprolol succinate (TOPROL-XL) 100 MG 24 hr tablet TAKE 1 TABLET ONCE A DAY WITH A MEAL  . olmesartan (BENICAR) 40 MG tablet Take 1 tablet (40 mg total) by mouth daily.  . Omega-3 Fatty Acids (FISH OIL) 1000 MG CAPS Take 1 capsule by mouth daily.  . ondansetron (ZOFRAN ODT) 4 MG disintegrating tablet Take 1 tablet (4 mg total) by mouth every 8 (eight) hours as needed for nausea or vomiting.  . pantoprazole (PROTONIX) 40 MG tablet Take 1 tablet (40 mg total) by mouth daily.  . potassium chloride (K-DUR) 10 MEQ tablet Take 1 tablet (10 mEq total) by mouth daily.   No facility-administered encounter medications on file as of 09/21/2018.     Allergies (verified) Codeine; Latex; Morphine; Niacin; Sulfa antibiotics; and Sulfonamide derivatives   History: Past Medical History:  Diagnosis Date  . Anxiety   . Carotid bruit   . Cataract   . [place]'s colitis [place]   . CVA (cerebral infarction)    No deficits  . Depression   . Diabetes mellitus    type 2  . Dyslipidemia   . GERD (gastroesophageal reflux disease)   . History of TIAs  no deficits  . Hyperlipidemia   . Hypertension   . Hyponatremia   . Osteoarthritis    Past Surgical History:  Procedure Laterality Date  . ABDOMINAL HYSTERECTOMY    . BACK SURGERY  1994  . CATARACT EXTRACTION W/PHACO Left 09/19/2017   Procedure: CATARACT EXTRACTION PHACO AND INTRAOCULAR LENS PLACEMENT (IOC);  Surgeon: Kara Goldmann, MD;  Location: [facility];  Service: Ophthalmology;  Laterality: Left;  CDE: 8.10  . CATARACT EXTRACTION W/PHACO Right 10/10/2017   Procedure: CATARACT EXTRACTION PHACO AND INTRAOCULAR LENS PLACEMENT  RIGHT EYE;  Surgeon: Kara Goldmann, MD;  Location: [facility];  Service: Ophthalmology;  Laterality: Right;  CDE: 9.60  . COLON RESECTION  1990's  . TUBAL LIGATION    . VESICOVAGINAL FISTULA CLOSURE W/ TAH  1984   Family History  Problem Relation Age of Onset  . Coronary artery disease Sister        stent x 3  . Seizures Sister   . Coronary artery disease Brother        stent  . Hypertension Mother   . Stroke Mother   . Emphysema Father   . Alcohol abuse Brother   . Lung disease Brother   . Heart attack Daughter    Social History   Socioeconomic History  . Marital status: Widowed    Spouse name: Not on file  . Number of children: 6  . Years of education: 9  . Highest education level: 9th grade  Occupational History  . Occupation: Retired    Comment: [employer]  Social Needs  . Financial resource strain: Not very hard  . Food insecurity:    Worry: Never true    Inability: Never true  . Transportation needs:    Medical: No    Non-medical: No  Tobacco Use  . Smoking status: Never Smoker  . Smokeless tobacco: Never Used  Substance and Sexual Activity  . Alcohol use: No  . Drug use: No  . Sexual activity: Not Currently  Lifestyle  . Physical activity:    Days per week: 0 days    Minutes per session: 0 min  . Stress: To some extent  Relationships  . Social connections:    Talks on phone: More than three times a week    Gets together: More than three times a week    Attends religious service: Never    Active member of club or organization: No    Attends meetings of clubs or organizations: Never    Relationship status: Widowed  Other Topics Concern  . Not on file  Social History Narrative   [name] lives at home alone. She lives within walking distance to her 2 daughter's houses. Has an indoor cat. Her sister moved to [place] and she doesn't get to get to visit with her often but does talk with her on the phone. She has 6 children and many grandchildren and great  grandchildren.      Clinical Intake:     Pain : No/denies pain     Nutritional Status: BMI of 19-24  Normal(Eats two meals a day. late breakfast and then supper. Will cook some and eats with her daughter as well. She eats at home mostly and out on occasion. Has one soda a day and 2 or 3 cups of water a day. ) Nutritional Risks: Other (Comment)(advancing age and lives alone) Diabetes: Yes CBG done?: No Did pt. bring in CBG monitor from home?: No  How often do you need to have  someone help you when you read instructions, pamphlets, or other written materials from your doctor or pharmacy?: 1 - Never What is the last grade level you completed in school?: 9th     Information entered by :: [name], RN   Activities of Daily Living In your present state of health, do you have any difficulty performing the following activities: 09/21/2018 01/10/2018  Hearing? Y N  Comment some hearing loss. Has been tested and isn't interested in hearing aids at this time.  -  Vision? N N  Comment had cataracts removed and wears glasses. Sees eye doctor yearly.  -  Difficulty concentrating or making decisions? N N  Walking or climbing stairs? N N  Dressing or bathing? N N  Doing errands, shopping? N N  Preparing Food and eating ? N -  Using the Toilet? N -  In the past six months, have you accidently leaked urine? Y -  Comment has some stress and urge incontinence -  Do you have problems with loss of bowel control? N -  Managing your Medications? N -  Comment keeps them in a weekly pill box -  Managing your Finances? N -  Housekeeping or managing your Housekeeping? N -  Some recent data might be hidden     Exercise Current Exercise Habits: The patient does not participate in regular exercise at present, Exercise limited by: Other - see comments(advancing age)   Depression Screen PHQ 2/9 Scores 09/21/2018 08/19/2018 07/21/2018 03/25/2018 01/08/2018 10/07/2017 06/17/2017  PHQ - 2 Score 0 0 5  0 4 4 5   PHQ- 9 Score - - 16 - 16 16 15      Fall Risk Fall Risk  09/21/2018 08/19/2018 07/21/2018 03/25/2018 01/08/2018  Falls in the past year? 0 No No No No  Comment Gets up once to go to the bathroom during the night. She doesn't have a light on and doesn't want one. She can feel to get to the bathroom. Has a tub/shower combination with handrails. No difficulty getting in and out. No stairs. Has some difficulty with - - - -  Number falls in past yr: 0 - - - -  Injury with Fall? 0 - - - -  Comment - - - - -  Risk Factor Category  - - - - -  Risk for fall due to : Impaired balance/gait - - - -  Risk for fall due to: Comment - - - - -  Follow up Falls prevention discussed - - - -     Objective:    Today's Vitals   09/21/18 1513  BP: (!) 155/56  Pulse: (!) 54  Weight: 120 lb (54.4 kg)  Height: 4' 11"  (1.499 m)   Body mass index is 24.24 kg/m.  Advanced Directives 01/10/2018 01/09/2018 10/10/2017 09/19/2017 09/16/2017 04/06/2016 07/26/2015  Does Patient Have a Medical Advance Directive? No No No No No No No  Would patient like information on creating a medical advance directive? Yes (Inpatient - patient requests chaplain consult to create a medical advance directive);Yes (Inpatient - patient defers creating a medical advance directive at this time) - No - Patient declined No - Patient declined No - Patient declined No - patient declined information Yes - Educational materials given    Hearing/Vision  No hearing or vision deficits noted during visit.  Cognitive Function: MMSE - Mini Mental State Exam 07/26/2015  Orientation to time 5  Orientation to Place 5  Registration 3  Attention/ Calculation 3  Recall  3  Language- name 2 objects 2  Language- repeat 1  Language- follow 3 step command 2  Language- read & follow direction 1  Write a sentence 1  Copy design 1  Total score 27     6CIT Screen 09/21/2018  What Year? 0 points  What month? 0 points  What time? 0 points  Count  back from 20 0 points  Months in reverse 0 points  Repeat phrase 0 points  Total Score 0   Normal Cognitive Function Screening: Yes    Immunizations and Health Maintenance Immunization History  Administered Date(s) Administered  . Influenza Split 07/23/2013, 07/24/2015  . Influenza,inj,Quad PF,6+ Mos 08/01/2017, 08/15/2018  . Influenza-Unspecified 07/18/2014, 08/05/2016, 08/17/2018  . Pneumococcal Conjugate-13 07/18/2014  . Pneumococcal Polysaccharide-23 08/04/2008, 08/17/2018  . Pneumococcal-Unspecified 08/17/2018  . Td 06/11/2005  . Tdap 08/15/2014  . Zoster 08/07/2012   Health Maintenance Due  Topic Date Due  . OPHTHALMOLOGY EXAM  07/15/2018   Health Maintenance  Topic Date Due  . OPHTHALMOLOGY EXAM  07/15/2018  . FOOT EXAM  10/07/2018  . HEMOGLOBIN A1C  01/19/2019  . TETANUS/TDAP  08/15/2024  . INFLUENZA VACCINE  Completed  . DEXA SCAN  Completed  . PNA vac Low Risk Adult  Completed        Assessment:   This is a routine wellness examination for Kara Dean.    Plan:    Goals    . Exercise 150 min/wk Moderate Activity     Chair exercises or walking daily for 30 minutes    . Prevent falls     Use a light when going to the bathroom at night         Health Maintenance & Additional Screening Recommendations  Diabetic Eye Exam  Lung: Low Dose CT Chest recommended if Age 52-80 years, 30 pack-year currently smoking OR have quit w/in 15years. Patient does not qualify. Hepatitis C Screening recommended: no HIV Screening recommended: no  Keep f/u with Hassell Done, Mary-Margaret, FNP and any other specialty appointments you may have [place] for dry skin, humidifier, and increase water intake Empty bladder every 2 to 3 hours to decrease incontinence F/u with Dr Burt Knack. Will place referral. Recommended yearly screening with dermatologist Advanced directives given and discussed. Return a signed/notarized copy to our office.  Continue current medications Move carefully  to avoid falls. Use assistive devices like a cane or walker if needed. Add a nightlight to bedroom/bathroom. Aim for at least 150 minutes of moderate activity a week. See chair exercise handout. Consider recreation department.  Read or work on puzzles daily Stay connected with friends and family  I have personally reviewed and noted the following in the patient's chart:   . Medical and social history . Use of alcohol, tobacco or illicit drugs  . Current medications and supplements . Functional ability and status . Nutritional status . Physical activity . Advanced directives . List of other physicians . Hospitalizations, surgeries, and ER visits in previous 12 months . Vitals . Screenings to include cognitive, depression, and falls . Referrals and appointments  In addition, I have reviewed and discussed with patient certain preventive protocols, quality metrics, and best practice recommendations. A written personalized care plan for preventive services as well as general preventive health recommendations were provided to patient.     [name], RN   09/21/2018   I have reviewed and agree with the above AWV documentation.   [name], FNP

## 2018-10-22 ENCOUNTER — Encounter: Payer: Self-pay | Admitting: Nurse Practitioner

## 2018-10-22 ENCOUNTER — Ambulatory Visit (INDEPENDENT_AMBULATORY_CARE_PROVIDER_SITE_OTHER): Payer: Medicare Other | Admitting: Nurse Practitioner

## 2018-10-22 VITALS — BP 128/64 | HR 47 | Temp 97.8°F | Ht 59.0 in | Wt 121.0 lb

## 2018-10-22 DIAGNOSIS — N183 Chronic kidney disease, stage 3 (moderate): Secondary | ICD-10-CM

## 2018-10-22 DIAGNOSIS — N1831 Chronic kidney disease, stage 3a: Secondary | ICD-10-CM

## 2018-10-22 DIAGNOSIS — Z794 Long term (current) use of insulin: Secondary | ICD-10-CM | POA: Diagnosis not present

## 2018-10-22 DIAGNOSIS — I6930 Unspecified sequelae of cerebral infarction: Secondary | ICD-10-CM

## 2018-10-22 DIAGNOSIS — E119 Type 2 diabetes mellitus without complications: Secondary | ICD-10-CM | POA: Diagnosis not present

## 2018-10-22 DIAGNOSIS — I10 Essential (primary) hypertension: Secondary | ICD-10-CM

## 2018-10-22 DIAGNOSIS — K219 Gastro-esophageal reflux disease without esophagitis: Secondary | ICD-10-CM

## 2018-10-22 DIAGNOSIS — E785 Hyperlipidemia, unspecified: Secondary | ICD-10-CM | POA: Diagnosis not present

## 2018-10-22 DIAGNOSIS — F411 Generalized anxiety disorder: Secondary | ICD-10-CM

## 2018-10-22 DIAGNOSIS — R600 Localized edema: Secondary | ICD-10-CM

## 2018-10-22 DIAGNOSIS — R609 Edema, unspecified: Secondary | ICD-10-CM

## 2018-10-22 DIAGNOSIS — F3342 Major depressive disorder, recurrent, in full remission: Secondary | ICD-10-CM

## 2018-10-22 LAB — BAYER DCA HB A1C WAIVED: HB A1C: 8.1 % — AB (ref <7.0)

## 2018-10-22 MED ORDER — INSULIN GLARGINE 100 UNIT/ML ~~LOC~~ SOLN: 40.0000 [IU] | Freq: Every morning | SUBCUTANEOUS | 5 refills | Status: DC

## 2018-10-22 MED ORDER — LORAZEPAM 0.5 MG PO TABS: ORAL_TABLET | ORAL | 5 refills | Status: DC

## 2018-10-22 NOTE — Progress Notes (Signed)
Subjective:    Patient ID: Kara Dean, female    DOB: [birth year], 84 y.o.   MRN: [id number]   Chief Complaint: medical management of chronic issues  HPI:  1. Essential hypertension  No c/o chest pain, sob or headache. Does not check blood pressure at home. BP Readings from Last 3 Encounters:  09/21/18 (!) 155/56  08/19/18 134/65  07/21/18 (!) 149/53      2. Type 2 diabetes mellitus treated with insulin (HCC)  Last hgba1c was 6.6. she does not check her blood sugars every day. She denis any hypoglycemic symptoms.  3. Hyperlipidemia, unspecified hyperlipidemia type  Has a poor appetite but just eats what she feels like eating. Does no dedicated exercise  4. Gastroesophageal reflux disease without esophagitis  Patient on protonix daly.   5. Chronic kidney disease (CKD) stage G3a/A3, moderately decreased glomerular filtration rate (GFR) between 45-59 mL/min/1.73 square meter and albuminuria creatinine ratio greater than 300 mg/g (HCC) currently just watching labs  6. Peripheral edema  Has edema daily, usually will resolve at night  7. Late effect of cerebrovascular accident (CVA)  She denies any permanent probblems from stroke.  8. GAD (generalized anxiety disorder)  She is on ativan BID. gets very anxious when she does not have meds  9. Recurrent major depressive disorder, in full remission [facility])  She takes celexa daily. Works well for her. Depression screen [facility] 2/9 10/22/2018 09/21/2018 08/19/2018  Decreased Interest 0 0 0  Down, Depressed, Hopeless 0 0 0  PHQ - 2 Score 0 0 0  Altered sleeping - - -  Tired, decreased energy - - -  Change in appetite - - -  Feeling bad or failure about yourself  - - -  Trouble concentrating - - -  Moving slowly or fidgety/restless - - -  Suicidal thoughts - - -  PHQ-9 Score - - -  Difficult doing work/chores - - -  Some recent data might be hidden       Outpatient Encounter Medications as of 10/22/2018  Medication Sig  .  amLODipine (NORVASC) 10 MG tablet Take 1 tablet (10 mg total) by mouth daily.  Marland Kitchen atorvastatin (LIPITOR) 10 MG tablet Take 1 tablet (10 mg total) by mouth daily.  . B Complex Vitamins (B COMPLEX-B12) TABS Take 1 tablet by mouth daily.    . Cholecalciferol (VITAMIN D3 PO) Take 1 capsule daily by mouth.  . citalopram (CELEXA) 20 MG tablet Take 1 tablet (20 mg total) by mouth daily.  Marland Kitchen dipyridamole-aspirin (AGGRENOX) 200-25 MG 12hr capsule TAKE  (1)  CAPSULE  TWICE DAILY.  . fluticasone (FLONASE) 50 MCG/ACT nasal spray USE 2 SPRAYS IN EACH NOSTRIL ONCE DAILY.  . furosemide (LASIX) 20 MG tablet Take 1 tablet (20 mg total) by mouth daily.  Marland Kitchen glucose blood (ONE TOUCH ULTRA TEST) test strip CHECK BLOOD SUGER UP TO 3 TIMES A DAY  . insulin glargine (LANTUS) 100 UNIT/ML injection Inject 0.34 mLs (34 Units total) into the skin every morning.  Marland Kitchen LORazepam (ATIVAN) 0.5 MG tablet TAKE (1) TABLET TWICE A DAY.  . Magnesium 250 MG TABS Take 250 mg daily by mouth.   . metoprolol succinate (TOPROL-XL) 100 MG 24 hr tablet TAKE 1 TABLET ONCE A DAY WITH A MEAL  . olmesartan (BENICAR) 40 MG tablet Take 1 tablet (40 mg total) by mouth daily.  . Omega-3 Fatty Acids (FISH OIL) 1000 MG CAPS Take 1 capsule by mouth daily.  . ondansetron (ZOFRAN ODT) 4 MG  disintegrating tablet Take 1 tablet (4 mg total) by mouth every 8 (eight) hours as needed for nausea or vomiting.  . pantoprazole (PROTONIX) 40 MG tablet Take 1 tablet (40 mg total) by mouth daily.  . potassium chloride (K-DUR) 10 MEQ tablet Take 1 tablet (10 mEq total) by mouth daily.       New complaints: None today  Social history: Lives by her self, her daughter checks on her frequently.   Review of Systems  Constitutional: Negative for activity change and appetite change.  HENT: Negative.   Eyes: Negative for pain.  Respiratory: Negative for shortness of breath.   Cardiovascular: Negative for chest pain, palpitations and leg swelling.  Gastrointestinal:  Negative for abdominal pain.  Endocrine: Negative for polydipsia.  Genitourinary: Negative.   Skin: Negative for rash.  Neurological: Negative for dizziness, weakness and headaches.  Hematological: Does not bruise/bleed easily.  Psychiatric/Behavioral: Negative.   All other systems reviewed and are negative.      Objective:   Physical Exam Vitals signs and nursing note reviewed.  Constitutional:      General: She is not in acute distress.    Appearance: Normal appearance. She is well-developed.  HENT:     Head: Normocephalic.     Nose: Nose normal.  Eyes:     Pupils: Pupils are equal, round, and reactive to light.  Neck:     Musculoskeletal: Normal range of motion and neck supple.     Vascular: No carotid bruit or JVD.  Cardiovascular:     Rate and Rhythm: Normal rate and regular rhythm.     Heart sounds: Normal heart sounds.  Pulmonary:     Effort: Pulmonary effort is normal. No respiratory distress.     Breath sounds: Normal breath sounds. No wheezing or rales.  Chest:     Chest wall: No tenderness.  Abdominal:     General: Bowel sounds are normal. There is no distension or abdominal bruit.     Palpations: Abdomen is soft. There is no hepatomegaly, splenomegaly, mass or pulsatile mass.     Tenderness: There is no abdominal tenderness.  Musculoskeletal: Normal range of motion.  Lymphadenopathy:     Cervical: No cervical adenopathy.  Skin:    General: Skin is warm and dry.  Neurological:     Mental Status: She is alert and oriented to person, place, and time.     Deep Tendon Reflexes: Reflexes are normal and symmetric.  Psychiatric:        Behavior: Behavior normal.        Thought Content: Thought content normal.        Judgment: Judgment normal.    BP 128/64 (BP Location: Left Arm, Cuff Size: Normal)   Pulse (!) 47   Temp 97.8 F (36.6 C) (Oral)   Ht 4' 11"  (1.499 m)   Wt 121 lb (54.9 kg)   BMI 24.44 kg/m   hgba1c 8.1%       Assessment & Plan:  MAGDALA BRAHMBHATT comes in today with chief complaint of Medical Management of Chronic Issues   Diagnosis and orders addressed:  1. Essential hypertension Low sodium diet - CMP14+EGFR  2. Type 2 diabetes mellitus treated with insulin (HCC) stricter carb counting Increased lantus to 40 u nightly - Bayer DCA Hb A1c Waived - insulin glargine (LANTUS) 100 UNIT/ML injection; Inject 0.4 mLs (40 Units total) into the skin every morning.  Dispense: 30 mL; Refill: 5  3. Hyperlipidemia, unspecified hyperlipidemia type Low fat diet -  Lipid panel  4. Gastroesophageal reflux disease without esophagitis Avoid spicy foods Do not eat 2 hours prior to bedtime  5. Chronic kidney disease (CKD) stage G3a/A3, moderately decreased glomerular filtration rate (GFR) between 45-59 mL/min/1.73 square meter and albuminuria creatinine ratio greater than 300 mg/g (HCC) Labs pending  6. Peripheral edema elevated legs when sitting  7. Late effect of cerebrovascular accident (CVA)  8. GAD (generalized anxiety disorder) Stress management - LORazepam (ATIVAN) 0.5 MG tablet; TAKE (1) TABLET TWICE A DAY.  Dispense: 60 tablet; Refill: 5  9. Recurrent major depressive disorder, in full remission ([place])   Labs pending Health Maintenance reviewed Diet and exercise encouraged  Follow up plan: 3 months   [place], FNP

## 2018-10-23 LAB — CMP14+EGFR
ALT: 8 IU/L (ref 0–32)
AST: 13 IU/L (ref 0–40)
Albumin/Globulin Ratio: 1.4 (ref 1.2–2.2)
Albumin: 4.1 g/dL (ref 3.5–4.7)
Alkaline Phosphatase: 74 IU/L (ref 39–117)
BILIRUBIN TOTAL: 0.4 mg/dL (ref 0.0–1.2)
BUN/Creatinine Ratio: 15 (ref 12–28)
BUN: 17 mg/dL (ref 8–27)
CHLORIDE: 100 mmol/L (ref 96–106)
CO2: 24 mmol/L (ref 20–29)
Calcium: 10.3 mg/dL (ref 8.7–10.3)
Creatinine, Ser: 1.15 mg/dL — ABNORMAL HIGH (ref 0.57–1.00)
GFR calc Af Amer: 51 mL/min/{1.73_m2} — ABNORMAL LOW (ref >59)
GFR calc non Af Amer: 44 mL/min/{1.73_m2} — ABNORMAL LOW (ref >59)
GLUCOSE: 199 mg/dL — AB (ref 65–99)
Globulin, Total: 3 g/dL (ref 1.5–4.5)
Potassium: 4.6 mmol/L (ref 3.5–5.2)
Sodium: 140 mmol/L (ref 134–144)
TOTAL PROTEIN: 7.1 g/dL (ref 6.0–8.5)

## 2018-10-23 LAB — LIPID PANEL
CHOLESTEROL TOTAL: 130 mg/dL (ref 100–199)
Chol/HDL Ratio: 2.8 ratio (ref 0.0–4.4)
HDL: 46 mg/dL (ref >39)
LDL Calculated: 51 mg/dL (ref 0–99)
TRIGLYCERIDES: 166 mg/dL — AB (ref 0–149)
VLDL CHOLESTEROL CAL: 33 mg/dL (ref 5–40)

## 2018-11-03 ENCOUNTER — Encounter: Payer: Self-pay | Admitting: Physician Assistant

## 2018-11-03 ENCOUNTER — Ambulatory Visit (INDEPENDENT_AMBULATORY_CARE_PROVIDER_SITE_OTHER): Payer: Medicare Other | Admitting: Physician Assistant

## 2018-11-03 VITALS — BP 142/50 | HR 54 | Ht 59.0 in | Wt 125.0 lb

## 2018-11-03 DIAGNOSIS — I10 Essential (primary) hypertension: Secondary | ICD-10-CM

## 2018-11-03 DIAGNOSIS — E785 Hyperlipidemia, unspecified: Secondary | ICD-10-CM | POA: Diagnosis not present

## 2018-11-03 DIAGNOSIS — I739 Peripheral vascular disease, unspecified: Secondary | ICD-10-CM

## 2018-11-03 DIAGNOSIS — I779 Disorder of arteries and arterioles, unspecified: Secondary | ICD-10-CM

## 2018-11-03 NOTE — Patient Instructions (Signed)
Medication Instructions:  Your physician recommends that you continue on your current medications as directed. Please refer to the Current Medication list given to you today.  If you need a refill on your cardiac medications before your next appointment, please call your pharmacy.   Lab work: None   If you have labs (blood work) drawn today and your tests are completely normal, you will receive your results only by: Marland Kitchen MyChart Message (if you have MyChart) OR . A paper copy in the mail If you have any lab test that is abnormal or we need to change your treatment, we will call you to review the results.  Testing/Procedures: Your physician has requested that you have a carotid duplex. This test is an ultrasound of the carotid arteries in your neck. It looks at blood flow through these arteries that supply the brain with blood. Allow one hour for this exam. There are no restrictions or special instructions.   Follow-Up: At [facility], you and your health needs are our priority.  As part of our continuing mission to provide you with exceptional heart care, we have created designated Provider Care Teams.  These Care Teams include your primary Cardiologist (physician) and Advanced Practice Providers (APPs -  Physician Assistants and Nurse Practitioners) who all work together to provide you with the care you need, when you need it. You will need a follow up appointment in:  1 years.  Please call our office 2 months in advance to schedule this appointment.  You may see Sherren Mocha, MD or one of the following Advanced Practice Providers on your designated Care Team: Richardson Dopp, PA-C [place], [place] . Daune Perch, NP  Any Other Special Instructions Will Be Listed Below (If Applicable).

## 2018-11-03 NOTE — Progress Notes (Signed)
Cardiology Office Note:    Date:  11/03/2018   ID:  Kara, Dean [birth year], MRN [id number]  PCP:  [name], FNP  Cardiologist:  Sherren Mocha, MD  Electrophysiologist:  None   Referring MD: [name], *   Chief Complaint  Patient presents with  . Follow-up    HTN, carotid dz    History of Present Illness:    Kara Dean is a 84 y.o. female with hypertension, hyperlipidemia, diabetes, longstanding chest pain and prior TIA.  Nuclear stress test in 2014 demonstrated normal perfusion.  She was last seen by Dr. Burt Knack in 11/2016.     [name] returns for follow up.  She is here with her daughter.  She notes occasional chest pain with certain positions.  She denies exertional chest pain.  She has not had significant shortness of breath.  She denies syncope but does get lightheaded sometimes when she bends over.  She denies paroxysmal nocturnal dyspnea, orthopnea.  She has some leg swelling that is overall stable.    Prior CV studies:   The following studies were reviewed today:  Carotid US 05/27/17 Heterogeneous plaque, bilaterally. Progression of RICA disease with higher velocities compared to prior exam, now in high end 1-39% range of stenosis. Stable 6-31% LICA stenosis. Normal subclavian arteries, bilaterally. Patent vertebral arteries with antegrade flow. f/u 1 year due to prograssion of disease  Nuclear stress test 02/15/13 Normal stress nuclear study.  LV Ejection Fraction: 68%.   Past Medical History:  Diagnosis Date  . Anxiety   . Carotid bruit   . Cataract   . Crohn's colitis ([place])   . CVA (cerebral infarction)    No deficits  . Depression   . Diabetes mellitus    type 2  . Dyslipidemia   . GERD (gastroesophageal reflux disease)   . History of TIAs    no deficits  . Hyperlipidemia   . Hypertension   . Hyponatremia   . Osteoarthritis    Surgical Hx: The patient  has a past surgical history that includes Back surgery  (1994); Vesicovaginal fistula closure w/ TAH (1984); Tubal ligation; Abdominal hysterectomy; Colon resection (1990's); Cataract extraction w/PHACO (Left, 09/19/2017); and Cataract extraction w/PHACO (Right, 10/10/2017).   Current Medications: Current Meds  Medication Sig  . amLODipine (NORVASC) 10 MG tablet Take 1 tablet (10 mg total) by mouth daily.  Marland Kitchen atorvastatin (LIPITOR) 10 MG tablet Take 1 tablet (10 mg total) by mouth daily.  . B Complex Vitamins (B COMPLEX-B12) TABS Take 1 tablet by mouth daily.    . Cholecalciferol (VITAMIN D3 PO) Take 1 capsule daily by mouth.  . citalopram (CELEXA) 20 MG tablet Take 1 tablet (20 mg total) by mouth daily.  Marland Kitchen dipyridamole-aspirin (AGGRENOX) 200-25 MG 12hr capsule TAKE  (1)  CAPSULE  TWICE DAILY.  . fluticasone (FLONASE) 50 MCG/ACT nasal spray USE 2 SPRAYS IN EACH NOSTRIL ONCE DAILY.  . furosemide (LASIX) 20 MG tablet Take 1 tablet (20 mg total) by mouth daily.  Marland Kitchen glucose blood (ONE TOUCH ULTRA TEST) test strip CHECK BLOOD SUGER UP TO 3 TIMES A DAY  . insulin glargine (LANTUS) 100 UNIT/ML injection Inject 0.4 mLs (40 Units total) into the skin every morning.  Marland Kitchen LORazepam (ATIVAN) 0.5 MG tablet TAKE (1) TABLET TWICE A DAY.  . Magnesium 250 MG TABS Take 250 mg daily by mouth.   . metoprolol succinate (TOPROL-XL) 100 MG 24 hr tablet TAKE 1 TABLET ONCE A DAY WITH A MEAL  .  olmesartan (BENICAR) 40 MG tablet Take 1 tablet (40 mg total) by mouth daily.  . Omega-3 Fatty Acids (FISH OIL) 1000 MG CAPS Take 1 capsule by mouth daily.  . pantoprazole (PROTONIX) 40 MG tablet Take 1 tablet (40 mg total) by mouth daily.  . potassium chloride (K-DUR) 10 MEQ tablet Take 1 tablet (10 mEq total) by mouth daily.     Allergies:   Codeine; Latex; Morphine; Niacin; Sulfa antibiotics; and Sulfonamide derivatives   Social History   Tobacco Use  . Smoking status: Never Smoker  . Smokeless tobacco: Never Used  Substance Use Topics  . Alcohol use: No  . Drug use: No      Family Hx: The patient's family history includes Alcohol abuse in her brother; Coronary artery disease in her brother and sister; Emphysema in her father; Heart attack in her daughter; Hypertension in her mother; Lung disease in her brother; Seizures in her sister; Stroke in her mother.  ROS:   Please see the history of present illness.    ROS All other systems reviewed and are negative.   EKGs/Labs/Other Test Reviewed:    EKG:  EKG is not ordered today.    Recent Labs: 01/10/2018: Hemoglobin 12.0; Platelets 215 10/22/2018: ALT 8; BUN 17; Creatinine, Ser 1.15; Potassium 4.6; Sodium 140   Recent Lipid Panel Lab Results  Component Value Date/Time   CHOL 130 10/22/2018 02:48 PM   CHOL 124 02/26/2013 10:43 AM   TRIG 166 (H) 10/22/2018 02:48 PM   TRIG 132 03/09/2015 10:36 AM   TRIG 182 (H) 02/26/2013 10:43 AM   HDL 46 10/22/2018 02:48 PM   HDL 39 (L) 03/09/2015 10:36 AM   HDL 40 02/26/2013 10:43 AM   CHOLHDL 2.8 10/22/2018 02:48 PM   CHOLHDL 3.0 11/10/2010 03:27 AM   LDLCALC 51 10/22/2018 02:48 PM   LDLCALC 42 04/27/2014 02:55 PM   LDLCALC 48 02/26/2013 10:43 AM    Physical Exam:    VS:  BP (!) 142/50   Pulse (!) 54   Ht 4' 11"  (1.499 m)   Wt 125 lb (56.7 kg)   SpO2 94%   BMI 25.25 kg/m     Wt Readings from Last 3 Encounters:  11/03/18 125 lb (56.7 kg)  10/22/18 121 lb (54.9 kg)  09/21/18 120 lb (54.4 kg)     Physical Exam  Constitutional: She is oriented to person, place, and time. She appears well-developed and well-nourished. No distress.  HENT:  Head: Normocephalic and atraumatic.  Neck: Neck supple. No thyromegaly present.  Cardiovascular: Normal rate, regular rhythm, S1 normal, S2 normal and normal heart sounds.  No murmur heard. Pulmonary/Chest: Effort normal. She has no rales.  Abdominal: Soft. There is no hepatomegaly.  Musculoskeletal:        General: No edema.  Neurological: She is alert and oriented to person, place, and time.  Skin: Skin is warm  and dry.    ASSESSMENT & PLAN:    Bilateral carotid artery disease, unspecified type ([place]) She is past due for follow up carotid ultrasound.    -Schedule Carotid US  -Continue ASA, statin.   Essential hypertension BP at home typically optimal.  Continue current Rx.  Hyperlipidemia, unspecified hyperlipidemia type   LDL optimal on most recent lab work.  Continue current Rx.     Dispo:  Return in about 1 year (around 11/04/2019) for Routine Follow Up, w/ Dr. Burt Knack.   Medication Adjustments/Labs and Tests Ordered: Current medicines are reviewed at length with the patient  today.  Concerns regarding medicines are outlined above.  Tests Ordered: No orders of the defined types were placed in this encounter.  Medication Changes: No orders of the defined types were placed in this encounter.   Signed, Richardson Dopp, PA-C  11/03/2018 4:37 PM    [facility] [place], [place], [place]  [address] Phone: [phone]; Fax: [phone]

## 2018-11-11 ENCOUNTER — Ambulatory Visit (HOSPITAL_COMMUNITY)
Admission: RE | Admit: 2018-11-11 | Discharge: 2018-11-11 | Disposition: A | Discharge status: Home / Self Care | Payer: Medicare Other | Source: Ambulatory Visit | Attending: Cardiology | Admitting: Cardiology

## 2018-11-11 ENCOUNTER — Encounter: Payer: Self-pay | Admitting: Physician Assistant

## 2018-11-11 ENCOUNTER — Other Ambulatory Visit: Payer: Self-pay | Admitting: Physician Assistant

## 2018-11-11 DIAGNOSIS — I6523 Occlusion and stenosis of bilateral carotid arteries: Secondary | ICD-10-CM | POA: Diagnosis not present

## 2018-11-11 DIAGNOSIS — I739 Peripheral vascular disease, unspecified: Secondary | ICD-10-CM | POA: Insufficient documentation

## 2018-11-11 DIAGNOSIS — I779 Disorder of arteries and arterioles, unspecified: Secondary | ICD-10-CM

## 2018-11-12 ENCOUNTER — Telehealth: Payer: Self-pay | Admitting: *Deleted

## 2018-11-12 DIAGNOSIS — I6523 Occlusion and stenosis of bilateral carotid arteries: Secondary | ICD-10-CM

## 2018-11-12 DIAGNOSIS — B351 Tinea unguium: Secondary | ICD-10-CM | POA: Diagnosis not present

## 2018-11-12 DIAGNOSIS — M79676 Pain in unspecified toe(s): Secondary | ICD-10-CM | POA: Diagnosis not present

## 2018-11-12 DIAGNOSIS — E1142 Type 2 diabetes mellitus with diabetic polyneuropathy: Secondary | ICD-10-CM | POA: Diagnosis not present

## 2018-11-12 DIAGNOSIS — L84 Corns and callosities: Secondary | ICD-10-CM | POA: Diagnosis not present

## 2018-11-12 NOTE — Telephone Encounter (Signed)
DPR ok to s/w pt's daughter Kara Dean who has been notified of Carotid results by phone with verbal understanding. Advised will repeat Carotids in 2 yrs. Pt thanked me for the call. I will forward a copy to PCP Spencer, FNP.

## 2018-11-12 NOTE — Telephone Encounter (Signed)
-----   Message from Liliane Shi, [place] sent at 11/11/2018  5:10 PM EST ----- Please call patient. The carotid US shows Mild bilateral carotid plaque. Recommendations:  - Continue current medications and follow up as planned.   - Repeat in 2 years  - Send copy to PCP.  Richardson Dopp, PA-C    11/11/2018 5:09 PM

## 2019-01-13 LAB — HM DIABETES EYE EXAM

## 2019-01-14 ENCOUNTER — Other Ambulatory Visit: Payer: Self-pay | Admitting: Nurse Practitioner

## 2019-01-14 DIAGNOSIS — I2510 Atherosclerotic heart disease of native coronary artery without angina pectoris: Secondary | ICD-10-CM

## 2019-01-14 DIAGNOSIS — R609 Edema, unspecified: Secondary | ICD-10-CM

## 2019-01-14 DIAGNOSIS — I10 Essential (primary) hypertension: Secondary | ICD-10-CM

## 2019-01-14 DIAGNOSIS — F3342 Major depressive disorder, recurrent, in full remission: Secondary | ICD-10-CM

## 2019-01-14 DIAGNOSIS — E7800 Pure hypercholesterolemia, unspecified: Secondary | ICD-10-CM

## 2019-01-14 MED ORDER — GLUCOSE BLOOD VI STRP: ORAL_STRIP | 3 refills | Status: DC

## 2019-01-14 NOTE — Telephone Encounter (Signed)
Pt scheduled to see MMM 4/30 at 10:15.

## 2019-02-02 DIAGNOSIS — E1142 Type 2 diabetes mellitus with diabetic polyneuropathy: Secondary | ICD-10-CM | POA: Diagnosis not present

## 2019-02-02 DIAGNOSIS — M79676 Pain in unspecified toe(s): Secondary | ICD-10-CM | POA: Diagnosis not present

## 2019-02-02 DIAGNOSIS — B351 Tinea unguium: Secondary | ICD-10-CM | POA: Diagnosis not present

## 2019-02-02 DIAGNOSIS — L84 Corns and callosities: Secondary | ICD-10-CM | POA: Diagnosis not present

## 2019-02-11 ENCOUNTER — Other Ambulatory Visit: Payer: Self-pay | Admitting: Nurse Practitioner

## 2019-02-11 DIAGNOSIS — I2510 Atherosclerotic heart disease of native coronary artery without angina pectoris: Secondary | ICD-10-CM

## 2019-02-11 DIAGNOSIS — K219 Gastro-esophageal reflux disease without esophagitis: Secondary | ICD-10-CM

## 2019-03-04 ENCOUNTER — Other Ambulatory Visit: Payer: Self-pay

## 2019-03-04 ENCOUNTER — Ambulatory Visit (INDEPENDENT_AMBULATORY_CARE_PROVIDER_SITE_OTHER): Payer: Medicare Other | Admitting: Nurse Practitioner

## 2019-03-04 ENCOUNTER — Encounter: Payer: Self-pay | Admitting: Nurse Practitioner

## 2019-03-04 DIAGNOSIS — I6930 Unspecified sequelae of cerebral infarction: Secondary | ICD-10-CM | POA: Diagnosis not present

## 2019-03-04 DIAGNOSIS — F411 Generalized anxiety disorder: Secondary | ICD-10-CM

## 2019-03-04 DIAGNOSIS — E785 Hyperlipidemia, unspecified: Secondary | ICD-10-CM

## 2019-03-04 DIAGNOSIS — I10 Essential (primary) hypertension: Secondary | ICD-10-CM | POA: Diagnosis not present

## 2019-03-04 DIAGNOSIS — I2510 Atherosclerotic heart disease of native coronary artery without angina pectoris: Secondary | ICD-10-CM

## 2019-03-04 DIAGNOSIS — N183 Chronic kidney disease, stage 3 (moderate): Secondary | ICD-10-CM

## 2019-03-04 DIAGNOSIS — F3342 Major depressive disorder, recurrent, in full remission: Secondary | ICD-10-CM

## 2019-03-04 DIAGNOSIS — K219 Gastro-esophageal reflux disease without esophagitis: Secondary | ICD-10-CM

## 2019-03-04 DIAGNOSIS — Z794 Long term (current) use of insulin: Secondary | ICD-10-CM

## 2019-03-04 DIAGNOSIS — R609 Edema, unspecified: Secondary | ICD-10-CM

## 2019-03-04 DIAGNOSIS — E119 Type 2 diabetes mellitus without complications: Secondary | ICD-10-CM

## 2019-03-04 DIAGNOSIS — E7800 Pure hypercholesterolemia, unspecified: Secondary | ICD-10-CM

## 2019-03-04 DIAGNOSIS — N1831 Chronic kidney disease, stage 3a: Secondary | ICD-10-CM

## 2019-03-04 MED ORDER — FUROSEMIDE 20 MG PO TABS: 20.0000 mg | ORAL_TABLET | Freq: Every day | ORAL | 1 refills | Status: DC

## 2019-03-04 MED ORDER — OLMESARTAN MEDOXOMIL 40 MG PO TABS: 40.0000 mg | ORAL_TABLET | Freq: Every day | ORAL | 1 refills | Status: DC

## 2019-03-04 MED ORDER — POTASSIUM CHLORIDE ER 10 MEQ PO TBCR: 10.0000 meq | EXTENDED_RELEASE_TABLET | Freq: Every day | ORAL | 1 refills | Status: DC

## 2019-03-04 MED ORDER — METOPROLOL SUCCINATE ER 100 MG PO TB24: ORAL_TABLET | ORAL | 1 refills | Status: DC

## 2019-03-04 MED ORDER — CITALOPRAM HYDROBROMIDE 20 MG PO TABS: 20.0000 mg | ORAL_TABLET | Freq: Every day | ORAL | 1 refills | Status: DC

## 2019-03-04 MED ORDER — AMLODIPINE BESYLATE 10 MG PO TABS: 10.0000 mg | ORAL_TABLET | Freq: Every day | ORAL | 1 refills | Status: DC

## 2019-03-04 MED ORDER — INSULIN GLARGINE 100 UNIT/ML ~~LOC~~ SOLN: 40.0000 [IU] | Freq: Every morning | SUBCUTANEOUS | 5 refills | Status: DC

## 2019-03-04 MED ORDER — LORAZEPAM 0.5 MG PO TABS: ORAL_TABLET | ORAL | 5 refills | Status: DC

## 2019-03-04 MED ORDER — ASPIRIN-DIPYRIDAMOLE ER 25-200 MG PO CP12: ORAL_CAPSULE | ORAL | 1 refills | Status: DC

## 2019-03-04 MED ORDER — ATORVASTATIN CALCIUM 10 MG PO TABS: 10.0000 mg | ORAL_TABLET | Freq: Every day | ORAL | 1 refills | Status: DC

## 2019-03-04 MED ORDER — PANTOPRAZOLE SODIUM 40 MG PO TBEC: 40.0000 mg | DELAYED_RELEASE_TABLET | Freq: Every day | ORAL | 1 refills | Status: DC

## 2019-03-04 NOTE — Progress Notes (Signed)
Patient ID: Kara Dean, female   DOB: Dec 31, 1932, 85 y.o.   MRN: [id number]    Virtual Visit via telephone Note  I connected with Kara Dean on 03/04/19 at 10:05 by telephone and verified that I am speaking with the correct person using two identifiers. Kara Dean is currently located at home and her daughter is currently with her during visit. The provider, [name], FNP is located in their office at time of visit.  I discussed the limitations, risks, security and privacy concerns of performing an evaluation and management service by telephone and the availability of in person appointments. I also discussed with the patient that there may be a patient responsible charge related to this service. The patient expressed understanding and agreed to proceed.   History and Present Illness:   Chief Complaint: Medical Management of Chronic Issues    HPI:  1. Essential hypertension No c/o chest pain, sob or headache. Does not check blood pressure at home. BP Readings from Last 3 Encounters:  11/03/18 (!) 142/50  10/22/18 128/64  09/21/18 (!) 155/56     2. Hyperlipidemia, unspecified hyperlipidemia type Does not watch diet and no exercise  3. Gastroesophageal reflux disease without esophagitis Is currently on protonix and is working well. No recent symptoms  4. Type 2 diabetes mellitus treated with insulin (HCC) Last HGBA1c was 8.1. fasting blood sugars have been running 113mn. She increased her lantus to 36u nightly. That has brought blood sugars down to  Around 100. deneis any hypoglycemia.  5. Late effect of cerebrovascular accident (CVA) No effect from stroke. No weakness  6. Chronic kidney disease (CKD) stage G3a/A3, moderately decreased glomerular filtration rate (GFR) between 45-59 mL/min/1.73 square meter and albuminuria creatinine ratio greater than 300 mg/g (HCC) Last creatine was 1.15  7. Peripheral edema Has wor[place] but has been sitting with feet  down  8. GAD (generalized anxiety disorder) Is on ativan BID and is doing well when she takes meds  9. Recurrent major depressive disorder, in full remission ([place] Is on celexa daily and say s depression is well. [facility] 03/04[facility]2019 10/22/2018 09/21/2018  Decreased Interest 0 0 0  Down, Depressed, Hopeless 0 0 0  PHQ - 2 Score 0 0 0  Altered sleeping - - -  Tired, decreased energy - - -  Change in appetite - - -  Feeling bad or failure about yourself  - - -  Trouble concentrating - - -  Moving slowly or fidgety/restless - - -  Suicidal thoughts - - -  PHQ-9 Score - - -  Difficult doing work/chores - - -  Some recent data might be hidden      Outpatient Encounter Medications as of 03/04/2019  Medication Sig  . amLODipine (NORVASC) 10 MG tablet TAKE 1 TABLET DAILY  . atorvastatin (LIPITOR) 10 MG tablet TAKE 1 TABLET DAILY  . B Complex Vitamins (B COMPLEX-B12) TABS Take 1 tablet by mouth daily.    . Cholecalciferol (VITAMIN D3 PO) Take 1 capsule daily by mouth.  . citalopram (CELEXA) 20 MG tablet TAKE 1 TABLET DAILY  . dipyridamole-aspirin (AGGRENOX) 200-25 MG 12hr capsule TAKE  (1)  CAPSULE  TWICE DAILY.  . fluticasone (FLONASE) 50 MCG/ACT nasal spray USE 2 SPRAYS IN EACH NOSTRIL ONCE DAILY.  . furosemide (LASIX) 20 MG tablet TAKE 1 TABLET DAILY  . glucose blood (ONE TOUCH ULTRA TEST) test strip CHECK BLOOD SUGER UP TO 3 TIMES A DAY Dx: E11.9  .  insulin glargine (LANTUS) 100 UNIT/ML injection Inject 0.4 mLs (40 Units total) into the skin every morning.  Marland Kitchen LORazepam (ATIVAN) 0.5 MG tablet TAKE (1) TABLET TWICE A DAY.  . Magnesium 250 MG TABS Take 250 mg daily by mouth.   . metoprolol succinate (TOPROL-XL) 100 MG 24 hr tablet TAKE 1 TABLET ONCE A DAY WITH A MEAL  . olmesartan (BENICAR) 40 MG tablet Take 1 tablet (40 mg total) by mouth daily.  . Omega-3 Fatty Acids (FISH OIL) 1000 MG CAPS Take 1 capsule by mouth daily.  . pantoprazole (PROTONIX) 40 MG tablet Take 1  tablet (40 mg total) by mouth daily.  . potassium chloride (K-DUR) 10 MEQ tablet Take 1 tablet (10 mEq total) by mouth daily.     New complaints: None today  Social history: Patient lives alone and daughters check on her daily       Review of Systems  Constitutional: Negative for diaphoresis and weight loss.  Eyes: Negative for blurred vision, double vision and pain.  Respiratory: Negative for shortness of breath.   Cardiovascular: Negative for chest pain, palpitations, orthopnea and leg swelling.  Gastrointestinal: Negative for abdominal pain.  Skin: Negative for rash.  Neurological: Negative for dizziness, sensory change, loss of consciousness, weakness and headaches.  Endo/Heme/Allergies: Negative for polydipsia. Does not bruise/bleed easily.  Psychiatric/Behavioral: Negative for memory loss. The patient does not have insomnia.   All other systems reviewed and are negative.    Observations/Objective: Alert and oriented- answers all questions appropriately No distress noted  Assessment and Plan: Kara Dean comes in today with chief complaint of Medical Management of Chronic Issues   Diagnosis and orders addressed:  1. Essential hypertension Low sodium diet - amLODipine (NORVASC) 10 MG tablet; Take 1 tablet (10 mg total) by mouth daily.  Dispense: 90 tablet; Refill: 1 - olmesartan (BENICAR) 40 MG tablet; Take 1 tablet (40 mg total) by mouth daily.  Dispense: 90 tablet; Refill: 1 - potassium chloride (K-DUR) 10 MEQ tablet; Take 1 tablet (10 mEq total) by mouth daily.  Dispense: 90 tablet; Refill: 1  2. Hyperlipidemia, unspecified hyperlipidemia type Low fat diet  3. Gastroesophageal reflux disease without esophagitis Avoid spicy foods Do not eat 2 hours prior to bedtime - pantoprazole (PROTONIX) 40 MG tablet; Take 1 tablet (40 mg total) by mouth daily.  Dispense: 90 tablet; Refill: 1  4. Type 2 diabetes mellitus treated with insulin (HCC) Continue to watch  carbs in diet - insulin glargine (LANTUS) 100 UNIT/ML injection; Inject 0.4 mLs (40 Units total) into the skin every morning.  Dispense: 30 mL; Refill: 5  5. Late effect of cerebrovascular accident (CVA) No residula effects  6. Chronic kidney disease (CKD) stage G3a/A3, moderately decreased glomerular filtration rate (GFR) between 45-59 mL/min/1.73 square meter and albuminuria creatinine ratio greater than 300 mg/g (HCC) Will check creatine at next visit  7. Peripheral edema Elevate legs when sitting - furosemide (LASIX) 20 MG tablet; Take 1 tablet (20 mg total) by mouth daily.  Dispense: 90 tablet; Refill: 1  8. GAD (generalized anxiety disorder) Stress management - LORazepam (ATIVAN) 0.5 MG tablet; TAKE (1) TABLET TWICE A DAY.  Dispense: 60 tablet; Refill: 5  9. Recurrent major depressive disorder, in full remission ([place]) - citalopram (CELEXA) 20 MG tablet; Take 1 tablet (20 mg total) by mouth daily.  Dispense: 90 tablet; Refill: 1  10. Coronary artery disease involving native coronary artery of native heart without angina pectoris - metoprolol succinate (TOPROL-XL) 100 MG 24  hr tablet; Take with or immediately following a meal.  Dispense: 90 tablet; Refill: 1 - dipyridamole-aspirin (AGGRENOX) 200-25 MG 12hr capsule; TAKE  (1)  CAPSULE  TWICE DAILY.  Dispense: 180 capsule; Refill: 1  11. Pure hypercholesterolemia - atorvastatin (LIPITOR) 10 MG tablet; Take 1 tablet (10 mg total) by mouth daily.  Dispense: 90 tablet; Refill: 1   Previous lab results reciewed Health Maintenance reviewed Diet and exercise encouraged  Follow up plan: 3 months     I discussed the assessment and treatment plan with the patient. The patient was provided an opportunity to ask questions and all were answered. The patient agreed with the plan and demonstrated an understanding of the instructions.   The patient was advised to call back or seek an in-person evaluation if the symptoms worsen or if the  condition fails to improve as anticipated.  The above assessment and management plan was discussed with the patient. The patient verbalized understanding of and has agreed to the management plan. Patient is aware to call the clinic if symptoms persist or worsen. Patient is aware when to return to the clinic for a follow-up visit. Patient educated on when it is appropriate to go to the emergency department.    I provided 13 minutes of non-face-to-face time during this encounter.    [name], FNP

## 2019-03-09 ENCOUNTER — Encounter: Payer: Self-pay | Admitting: Nurse Practitioner

## 2019-03-09 ENCOUNTER — Other Ambulatory Visit: Payer: Self-pay

## 2019-03-09 ENCOUNTER — Telehealth: Payer: Self-pay | Admitting: Nurse Practitioner

## 2019-03-09 ENCOUNTER — Ambulatory Visit (INDEPENDENT_AMBULATORY_CARE_PROVIDER_SITE_OTHER): Payer: Medicare Other | Admitting: Nurse Practitioner

## 2019-03-09 VITALS — BP 157/61 | HR 50 | Temp 97.6°F | Ht 59.0 in | Wt 124.0 lb

## 2019-03-09 DIAGNOSIS — L0292 Furuncle, unspecified: Secondary | ICD-10-CM

## 2019-03-09 MED ORDER — AMOXICILLIN-POT CLAVULANATE 875-125 MG PO TABS: 1.0000 | ORAL_TABLET | Freq: Two times a day (BID) | ORAL | 0 refills | Status: DC

## 2019-03-09 NOTE — Progress Notes (Signed)
   Subjective:    Patient ID: Kara Dean, female    DOB: 06/26/1933, 85 y.o.   MRN: [id number]   Chief Complaint: Boil in private area   HPI Patient comes in today c/o boil in private area. Has been there for 3 days nad is sore to touch. denies any drainage.   Review of Systems  Respiratory: Negative.   Cardiovascular: Negative.   Skin:       Lesion on pubic area  Neurological: Negative.   Psychiatric/Behavioral: Negative.   All other systems reviewed and are negative.      Objective:   Physical Exam Vitals signs and nursing note reviewed.  Constitutional:      Appearance: Normal appearance.  Cardiovascular:     Rate and Rhythm: Normal rate.     Heart sounds: Normal heart sounds.  Pulmonary:     Effort: Pulmonary effort is normal.     Breath sounds: Normal breath sounds.  Skin:    General: Skin is warm and dry.     Comments: 3cm indurated erythematous lesion on left pubic area- no drainage  Neurological:     Mental Status: She is alert.  Psychiatric:        Mood and Affect: Mood normal.        Behavior: Behavior normal.     BP (!) 157/61   Pulse (!) 50   Temp 97.6 F (36.4 C) (Oral)   Ht 4' 11"  (1.499 m)   Wt 124 lb (56.2 kg)   BMI 25.04 kg/m        Assessment & Plan:  Kara Dean in today with chief complaint of Boil in private area   1. Furuncle Meds ordered this encounter  Medications  . amoxicillin-clavulanate (AUGMENTIN) 875-125 MG tablet    Sig: Take 1 tablet by mouth 2 (two) times daily.    Dispense:  20 tablet    Refill:  0    Order Specific Question:   Supervising Provider    Answer:   Caryl Pina A [[id number]]   Warm compresses Do not pick or scrach at area Clean with antibacterial soap BID   [name], FNP

## 2019-04-12 ENCOUNTER — Encounter: Payer: Self-pay | Admitting: Nurse Practitioner

## 2019-04-12 ENCOUNTER — Ambulatory Visit (INDEPENDENT_AMBULATORY_CARE_PROVIDER_SITE_OTHER): Payer: Medicare Other | Admitting: Nurse Practitioner

## 2019-04-12 ENCOUNTER — Other Ambulatory Visit: Payer: Self-pay

## 2019-04-12 VITALS — BP 138/52 | HR 57 | Temp 99.4°F | Ht 59.0 in | Wt 125.0 lb

## 2019-04-12 DIAGNOSIS — S81812A Laceration without foreign body, left lower leg, initial encounter: Secondary | ICD-10-CM | POA: Diagnosis not present

## 2019-04-12 NOTE — Progress Notes (Signed)
   Subjective:    Patient ID: Kara Dean, female    DOB: Mar 27, 1933, 85 y.o.   MRN: [id number]   Chief Complaint: skin tear left lower leg (happened today )   HPI Patient hit her leg on her shower chair and cut her left shin.    Review of Systems  Respiratory: Negative.   Cardiovascular: Negative.   Skin: Positive for wound (left shin).  Neurological: Negative.   Psychiatric/Behavioral: Negative.   All other systems reviewed and are negative.      Objective:   Physical Exam Vitals signs and nursing note reviewed.  Constitutional:      Appearance: Normal appearance.  Cardiovascular:     Rate and Rhythm: Normal rate and regular rhythm.     Heart sounds: Normal heart sounds.  Skin:    General: Skin is warm.     Comments: 6cm v shaped skin tear to left shin  Neurological:     General: No focal deficit present.     Mental Status: She is alert and oriented to person, place, and time.  Psychiatric:        Mood and Affect: Mood normal.        Behavior: Behavior normal.    BP (!) 138/52   Pulse (!) 57   Temp 99.4 F (37.4 C) (Oral)   Ht 4' 11"  (1.499 m)   Wt 125 lb (56.7 kg)   BMI 25.25 kg/m   Wound cleaned with saline- edges rolled outward- bacitracin ointment and tegaderm dressing applied.       Assessment & Plan:  Kara Dean in today with chief complaint of skin tear left lower leg (happened today )   1. Skin tear of left lower leg without complication, initial encounter Leave dressing on for 2 days then remove Clean wound bid with antbacterial soap and keep covered after removing dressing wtahc for signs of infection Last TD was 08/2014- does not need today Follow up prn.  [name], FNP

## 2019-04-12 NOTE — Patient Instructions (Signed)
Nonsutured Laceration Care A laceration is a cut that may go through all layers of the skin and extend into the tissue that is right under the skin. This type of cut is usually stitched up (sutured) or closed with tape (adhesive strips) or skin glue shortly after the injury happens. However, if the wound is dirty or if several hours pass before medical treatment is provided, it is likely that germs (bacteria) will enter the wound. Closing a laceration after bacteria have entered it increases the risk of infection. In these cases, your health care provider may leave the laceration open (nonsutured) and cover it with a bandage. This type of treatment helps prevent infection and allows the wound to heal from the deepest layer of tissue damage up to the surface. An open fracture is a type of injury that may involve nonsutured lacerations. An open fracture is a break in a bone that happens along with lacerations through the skin at the fracture site. What are the risks? Caring for a nonsutured laceration is safe. However, problems may occur, including a higher risk for:  Scarring.  Infection.  Slow healing. Supplies needed:  Soap.  Hand sanitizer.  Sterile water or irrigation solution.  Bandages (dressings).  Clean towel.  Antibiotic ointment. How to care for your nonsutured laceration Follow instructions from your health care provider about how to take care of your wound.  Keep the wound clean and dry.  Change any dressings as told by your health care provider. This includes changing the dressing when it starts to smell, or when it gets wet or dirty.  Clean the wound one time each day, or as often as told by your health care provider. To clean your wound: ? Wash your hands with soap and water. If soap and water are not available, use hand sanitizer. ? Remove any dressing as told by your health care provider. ? Clean the wound with sterile water or irrigation solution as told by your  health care provider. ? Pat the wound dry with a clean towel. Do not rub the wound. ? Apply a thin layer of antibiotic ointment to the wound as told by your health care provider. This will prevent infection and keep the dressing from sticking to the wound. ? Apply a new dressing as told by your health care provider.  Check your wound every day for signs of infection. Watch for: ? Redness, swelling, or pain. ? Fluid, blood, or pus. ? Bad smell on the wound or dressing. ? Warmth.  Do not take baths, swim, or do anything that puts your wound underwater until your health care provider approves.  Do not scratch or pick at the wound. Follow these instructions at home:  Take or apply over-the-counter and prescription medicines only as told by your health care provider.  If you were prescribed an antibiotic medicine, take or apply it as told by your health care provider. Do not stop using the antibiotic even if your condition improves.  Do not inject anything into the wound unless directed by your health care provider.  Raise (elevate) the injured area above the level of your heart while you are sitting or lying down, if possible.  If directed, put ice on the affected area: ? Put ice in a plastic bag. ? Place a towel between your skin and the bag. ? Leave the ice on for 20 minutes, 2-3 times a day.  Keep all follow-up visits as told by your health care provider. This is important.  Contact a health care provider if:  You received a tetanus shot and you have swelling, severe pain, redness, or bleeding at the injection site.  You have a fever.  Your pain is not controlled with medicine.  You have increased redness, swelling, or pain at the site of your wound.  You have fluid, blood, or pus coming from your wound.  You notice a bad smell coming from your wound or your dressing.  You notice something coming out of the wound, such as wood or glass.  You notice a change in the color of  your skin near your wound.  You develop a new rash.  You need to change the dressing frequently due to fluid, blood, or pus draining from the wound.  You develop numbness around your wound. Get help right away if:  Your pain suddenly increases and is severe.  You develop severe swelling around the wound.  The wound is on your hand or foot and you cannot properly move a finger or toe.  The wound is on your hand or foot, and you notice that your fingers or toes look pale or bluish.  You have a red streak going away from your wound. Summary  A laceration is a cut that may go through all layers of the skin and extend into the tissue that is right under the skin. It is usually closed with stitches, tape, or skin glue shortly after the injury happens.  If a wound is dirty or if several hours pass before medical treatment is provided, the laceration may be kept open (nonsutured) and covered with a bandage.  This type of treatment helps prevent infection and allows the wound to heal from the deepest layer of tissue damage up to the surface.  Follow instructions from your health care provider about how to take care of your wound. This information is not intended to replace advice given to you by your health care provider. Make sure you discuss any questions you have with your health care provider. Document Released: 09/18/2006 Document Revised: 11/10/2017 Document Reviewed: 11/10/2017 Elsevier Interactive Patient Education  2019 [employer].

## 2019-05-18 ENCOUNTER — Encounter: Payer: Self-pay | Admitting: *Deleted

## 2019-06-10 DIAGNOSIS — B351 Tinea unguium: Secondary | ICD-10-CM | POA: Diagnosis not present

## 2019-06-10 DIAGNOSIS — E1142 Type 2 diabetes mellitus with diabetic polyneuropathy: Secondary | ICD-10-CM | POA: Diagnosis not present

## 2019-06-10 DIAGNOSIS — L84 Corns and callosities: Secondary | ICD-10-CM | POA: Diagnosis not present

## 2019-06-10 DIAGNOSIS — M79676 Pain in unspecified toe(s): Secondary | ICD-10-CM | POA: Diagnosis not present

## 2019-07-06 ENCOUNTER — Other Ambulatory Visit: Payer: Self-pay

## 2019-07-08 ENCOUNTER — Other Ambulatory Visit: Payer: Self-pay

## 2019-07-09 ENCOUNTER — Ambulatory Visit (INDEPENDENT_AMBULATORY_CARE_PROVIDER_SITE_OTHER): Payer: Medicare Other | Admitting: Nurse Practitioner

## 2019-07-09 ENCOUNTER — Encounter: Payer: Self-pay | Admitting: Nurse Practitioner

## 2019-07-09 VITALS — BP 139/55 | HR 48 | Temp 97.5°F | Ht 59.0 in | Wt 123.0 lb

## 2019-07-09 DIAGNOSIS — N1831 Chronic kidney disease, stage 3a: Secondary | ICD-10-CM

## 2019-07-09 DIAGNOSIS — E7800 Pure hypercholesterolemia, unspecified: Secondary | ICD-10-CM

## 2019-07-09 DIAGNOSIS — E785 Hyperlipidemia, unspecified: Secondary | ICD-10-CM | POA: Diagnosis not present

## 2019-07-09 DIAGNOSIS — E119 Type 2 diabetes mellitus without complications: Secondary | ICD-10-CM | POA: Diagnosis not present

## 2019-07-09 DIAGNOSIS — K219 Gastro-esophageal reflux disease without esophagitis: Secondary | ICD-10-CM

## 2019-07-09 DIAGNOSIS — I2510 Atherosclerotic heart disease of native coronary artery without angina pectoris: Secondary | ICD-10-CM

## 2019-07-09 DIAGNOSIS — F411 Generalized anxiety disorder: Secondary | ICD-10-CM

## 2019-07-09 DIAGNOSIS — R609 Edema, unspecified: Secondary | ICD-10-CM

## 2019-07-09 DIAGNOSIS — I10 Essential (primary) hypertension: Secondary | ICD-10-CM | POA: Diagnosis not present

## 2019-07-09 DIAGNOSIS — Z794 Long term (current) use of insulin: Secondary | ICD-10-CM

## 2019-07-09 DIAGNOSIS — F3342 Major depressive disorder, recurrent, in full remission: Secondary | ICD-10-CM

## 2019-07-09 DIAGNOSIS — N183 Chronic kidney disease, stage 3 (moderate): Secondary | ICD-10-CM

## 2019-07-09 DIAGNOSIS — I6930 Unspecified sequelae of cerebral infarction: Secondary | ICD-10-CM

## 2019-07-09 LAB — BAYER DCA HB A1C WAIVED: HB A1C (BAYER DCA - WAIVED): 7.3 % — ABNORMAL HIGH (ref <7.0)

## 2019-07-09 MED ORDER — POTASSIUM CHLORIDE ER 10 MEQ PO TBCR: 10.0000 meq | EXTENDED_RELEASE_TABLET | Freq: Every day | ORAL | 1 refills | Status: DC

## 2019-07-09 MED ORDER — METOPROLOL SUCCINATE ER 100 MG PO TB24: ORAL_TABLET | ORAL | 1 refills | Status: DC

## 2019-07-09 MED ORDER — AMLODIPINE BESYLATE 10 MG PO TABS: 10.0000 mg | ORAL_TABLET | Freq: Every day | ORAL | 1 refills | Status: DC

## 2019-07-09 MED ORDER — FUROSEMIDE 20 MG PO TABS: 20.0000 mg | ORAL_TABLET | Freq: Every day | ORAL | 1 refills | Status: DC

## 2019-07-09 MED ORDER — LORAZEPAM 0.5 MG PO TABS: ORAL_TABLET | ORAL | 5 refills | Status: DC

## 2019-07-09 MED ORDER — ASPIRIN-DIPYRIDAMOLE ER 25-200 MG PO CP12: ORAL_CAPSULE | ORAL | 1 refills | Status: DC

## 2019-07-09 MED ORDER — INSULIN GLARGINE 100 UNIT/ML ~~LOC~~ SOLN: 40.0000 [IU] | Freq: Every morning | SUBCUTANEOUS | 5 refills | Status: DC

## 2019-07-09 MED ORDER — PANTOPRAZOLE SODIUM 40 MG PO TBEC: 40.0000 mg | DELAYED_RELEASE_TABLET | Freq: Every day | ORAL | 1 refills | Status: DC

## 2019-07-09 MED ORDER — CITALOPRAM HYDROBROMIDE 20 MG PO TABS: 20.0000 mg | ORAL_TABLET | Freq: Every day | ORAL | 1 refills | Status: DC

## 2019-07-09 MED ORDER — OLMESARTAN MEDOXOMIL 40 MG PO TABS: 40.0000 mg | ORAL_TABLET | Freq: Every day | ORAL | 1 refills | Status: DC

## 2019-07-09 MED ORDER — ATORVASTATIN CALCIUM 10 MG PO TABS: 10.0000 mg | ORAL_TABLET | Freq: Every day | ORAL | 1 refills | Status: DC

## 2019-07-09 NOTE — Progress Notes (Signed)
Subjective:    Patient ID: Kara Dean, female    DOB: [birth year], 85 y.o.   MRN: [id number]   Chief Complaint: medical management of chronic issues   HPI:  1. Essential hypertension No c/o chest pain, sob or headache. Does not check blood pressure at home. BP Readings from Last 3 Encounters:  07/09/19 (!) 139/55  04/12/19 (!) 138/52  03/09/19 (!) 157/61     2. Hyperlipidemia, unspecified hyperlipidemia type Tries to avoid fried foods. She does no exercise. Lab Results  Component Value Date   CHOL 130 10/22/2018   HDL 46 10/22/2018   LDLCALC 51 10/22/2018   TRIG 166 (H) 10/22/2018   CHOLHDL 2.8 10/22/2018     3. Type 2 diabetes mellitus treated with insulin ([place]) Does not check blood sugars at home very often. Denies any low blood sugars Lab Results  Component Value Date   HGBA1C 8.1 (H) 10/22/2018     4. Gastroesophageal reflux disease without esophagitis Is on protonix daily and is working well for her.  5. GAD (generalized anxiety disorder) Is on ativan bid and is doing well.  6. Recurrent major depressive disorder, in full remission ([place]) Has been on celexa and is doing well. Depression screen [facility] 2/9 07/09/2019 04/12/2019 03/09/2019  Decreased Interest 0 0 0  Down, Depressed, Hopeless 0 0 0  PHQ - 2 Score 0 0 0  Altered sleeping - - -  Tired, decreased energy - - -  Change in appetite - - -  Feeling bad or failure about yourself  - - -  Trouble concentrating - - -  Moving slowly or fidgety/restless - - -  Suicidal thoughts - - -  PHQ-9 Score - - -  Difficult doing work/chores - - -  Some recent data might be hidden     7. Chronic kidney disease (CKD) stage G3a/A3, moderately decreased glomerular filtration rate (GFR) between 45-59 mL/min/1.73 square meter and albuminuria creatinine ratio greater than 300 mg/g (HCC) Lab Results  Component Value Date   CREATININE 1.15 (H) 10/22/2018     8. Late effect of cerebrovascular accident (CVA) Has no  residual effect from CVA .  9. Peripheral edema Has edema on occasion , but not everyday.    Outpatient Encounter Medications as of 07/09/2019  Medication Sig  . amLODipine (NORVASC) 10 MG tablet Take 1 tablet (10 mg total) by mouth daily.  Marland Kitchen atorvastatin (LIPITOR) 10 MG tablet Take 1 tablet (10 mg total) by mouth daily.  . B Complex Vitamins (B COMPLEX-B12) TABS Take 1 tablet by mouth daily.    . Cholecalciferol (VITAMIN D3 PO) Take 1 capsule daily by mouth.  . citalopram (CELEXA) 20 MG tablet Take 1 tablet (20 mg total) by mouth daily.  Marland Kitchen dipyridamole-aspirin (AGGRENOX) 200-25 MG 12hr capsule TAKE  (1)  CAPSULE  TWICE DAILY.  . fluticasone (FLONASE) 50 MCG/ACT nasal spray USE 2 SPRAYS IN EACH NOSTRIL ONCE DAILY.  . furosemide (LASIX) 20 MG tablet Take 1 tablet (20 mg total) by mouth daily.  Marland Kitchen glucose blood (ONE TOUCH ULTRA TEST) test strip CHECK BLOOD SUGER UP TO 3 TIMES A DAY Dx: E11.9  . insulin glargine (LANTUS) 100 UNIT/ML injection Inject 0.4 mLs (40 Units total) into the skin every morning.  Marland Kitchen LORazepam (ATIVAN) 0.5 MG tablet TAKE (1) TABLET TWICE A DAY.  . Magnesium 250 MG TABS Take 250 mg daily by mouth.   . metoprolol succinate (TOPROL-XL) 100 MG 24 hr tablet Take with or immediately  following a meal.  . olmesartan (BENICAR) 40 MG tablet Take 1 tablet (40 mg total) by mouth daily.  . Omega-3 Fatty Acids (FISH OIL) 1000 MG CAPS Take 1 capsule by mouth daily.  . pantoprazole (PROTONIX) 40 MG tablet Take 1 tablet (40 mg total) by mouth daily.  . potassium chloride (K-DUR) 10 MEQ tablet Take 1 tablet (10 mEq total) by mouth daily.     Past Surgical History:  Procedure Laterality Date  . ABDOMINAL HYSTERECTOMY    . BACK SURGERY  1994  . CATARACT EXTRACTION W/PHACO Left 09/19/2017   Procedure: CATARACT EXTRACTION PHACO AND INTRAOCULAR LENS PLACEMENT (IOC);  Surgeon: Baruch Goldmann, MD;  Location: [facility];  Service: Ophthalmology;  Laterality: Left;  CDE: 8.10  . CATARACT  EXTRACTION W/PHACO Right 10/10/2017   Procedure: CATARACT EXTRACTION PHACO AND INTRAOCULAR LENS PLACEMENT RIGHT EYE;  Surgeon: Baruch Goldmann, MD;  Location: [facility];  Service: Ophthalmology;  Laterality: Right;  CDE: 9.60  . COLON RESECTION  1990's  . TUBAL LIGATION    . VESICOVAGINAL FISTULA CLOSURE W/ TAH  1984    Family History  Problem Relation Age of Onset  . Coronary artery disease Sister        stent x 3  . Seizures Sister   . Coronary artery disease Brother        stent  . Hypertension Mother   . Stroke Mother   . Emphysema Father   . Alcohol abuse Brother   . Lung disease Brother   . Heart attack Daughter     New complaints: None  today  Social history: Lives by herself in apartment and her granddaughter is going to move in with her to be her cregiver once it is approved  apartment complex.  Controlled substance contract: 07/09/19    Review of Systems  Constitutional: Negative for activity change and appetite change.  HENT: Negative.   Eyes: Negative for pain.  Respiratory: Negative for shortness of breath.   Cardiovascular: Negative for chest pain, palpitations and leg swelling.  Gastrointestinal: Negative for abdominal pain.  Endocrine: Negative for polydipsia.  Genitourinary: Negative.   Skin: Negative for rash.  Neurological: Negative for dizziness, weakness and headaches.  Hematological: Does not bruise/bleed easily.  Psychiatric/Behavioral: Negative.   All other systems reviewed and are negative.      Objective:   Physical Exam Vitals signs and nursing note reviewed.  Constitutional:      General: She is not in acute distress.    Appearance: Normal appearance. She is well-developed.  HENT:     Head: Normocephalic.     Nose: Nose normal.  Eyes:     Pupils: Pupils are equal, round, and reactive to light.  Neck:     Musculoskeletal: Normal range of motion and neck supple.     Vascular: No carotid bruit or JVD.  Cardiovascular:     Rate and  Rhythm: Normal rate and regular rhythm.     Heart sounds: Normal heart sounds.  Pulmonary:     Effort: Pulmonary effort is normal. No respiratory distress.     Breath sounds: Normal breath sounds. No wheezing or rales.  Chest:     Chest wall: No tenderness.  Abdominal:     General: Bowel sounds are normal. There is no distension or abdominal bruit.     Palpations: Abdomen is soft. There is no hepatomegaly, splenomegaly, mass or pulsatile mass.     Tenderness: There is no abdominal tenderness.  Musculoskeletal: Normal range of motion.  Lymphadenopathy:     Cervical: No cervical adenopathy.  Skin:    General: Skin is warm and dry.  Neurological:     Mental Status: She is alert and oriented to person, place, and time.     Deep Tendon Reflexes: Reflexes are normal and symmetric.  Psychiatric:        Behavior: Behavior normal.        Thought Content: Thought content normal.        Judgment: Judgment normal.    BP (!) 139/55   Pulse (!) 48   Temp (!) 97.5 F (36.4 C) (Oral)   Ht 4' 11" (1.499 m)   Wt 123 lb (55.8 kg)   BMI 24.84 kg/m   hgba1c 7.3%     Assessment & Plan:  TAHRA HITZEMAN comes in today with chief complaint of Discuss paperwork for a home aid   Diagnosis and orders addressed:  1. Essential hypertension Low sodium diet - CMP14+EGFR - olmesartan (BENICAR) 40 MG tablet; Take 1 tablet (40 mg total) by mouth daily.  Dispense: 90 tablet; Refill: 1 - amLODipine (NORVASC) 10 MG tablet; Take 1 tablet (10 mg total) by mouth daily.  Dispense: 90 tablet; Refill: 1 - potassium chloride (K-DUR) 10 MEQ tablet; Take 1 tablet (10 mEq total) by mouth daily.  Dispense: 90 tablet; Refill: 1  2. Hyperlipidemia, unspecified hyperlipidemia type Low fat diet - Lipid panel  3. Type 2 diabetes mellitus treated with insulin (HCC) Watch carbs in diet - Bayer DCA Hb A1c Waived - insulin glargine (LANTUS) 100 UNIT/ML injection; Inject 0.4 mLs (40 Units total) into the skin every  morning.  Dispense: 30 mL; Refill: 5  4. Gastroesophageal reflux disease without esophagitis Avoid spicy foods Do not eat 2 hours prior to bedtime - pantoprazole (PROTONIX) 40 MG tablet; Take 1 tablet (40 mg total) by mouth daily.  Dispense: 90 tablet; Refill: 1  5. GAD (generalized anxiety disorder) Stress management - LORazepam (ATIVAN) 0.5 MG tablet; TAKE (1) TABLET TWICE A DAY.  Dispense: 60 tablet; Refill: 5  6. Recurrent major depressive disorder, in full remission ([place]) - citalopram (CELEXA) 20 MG tablet; Take 1 tablet (20 mg total) by mouth daily.  Dispense: 90 tablet; Refill: 1  7. Chronic kidney disease (CKD) stage G3a/A3, moderately decreased glomerular filtration rate (GFR) between 45-59 mL/min/1.73 square meter and albuminuria creatinine ratio greater than 300 mg/g (HCC) Labs pending  8. Late effect of cerebrovascular accident (CVA)  71. Peripheral edema Elevate legs when sitting - furosemide (LASIX) 20 MG tablet; Take 1 tablet (20 mg total) by mouth daily.  Dispense: 90 tablet; Refill: 1  10. Coronary artery disease involving native coronary artery of native heart without angina pectoris - metoprolol succinate (TOPROL-XL) 100 MG 24 hr tablet; Take with or immediately following a meal.  Dispense: 90 tablet; Refill: 1 - dipyridamole-aspirin (AGGRENOX) 200-25 MG 12hr capsule; TAKE  (1)  CAPSULE  TWICE DAILY.  Dispense: 180 capsule; Refill: 1  11. Pure hypercholesterolemia Low fat diet - atorvastatin (LIPITOR) 10 MG tablet; Take 1 tablet (10 mg total) by mouth daily.  Dispense: 90 tablet; Refill: 1   Labs pending Health Maintenance reviewed Diet and exercise encouraged  Follow up plan: 3 months   [name], FNP

## 2019-07-09 NOTE — Patient Instructions (Signed)

## 2019-07-10 LAB — CMP14+EGFR
ALT: 10 IU/L (ref 0–32)
AST: 12 IU/L (ref 0–40)
Albumin/Globulin Ratio: 1.2 (ref 1.2–2.2)
Albumin: 4 g/dL (ref 3.6–4.6)
Alkaline Phosphatase: 65 IU/L (ref 39–117)
BUN/Creatinine Ratio: 16 (ref 12–28)
BUN: 21 mg/dL (ref 8–27)
Bilirubin Total: 0.3 mg/dL (ref 0.0–1.2)
CO2: 23 mmol/L (ref 20–29)
Calcium: 9.9 mg/dL (ref 8.7–10.3)
Chloride: 97 mmol/L (ref 96–106)
Creatinine, Ser: 1.3 mg/dL — ABNORMAL HIGH (ref 0.57–1.00)
GFR calc Af Amer: 44 mL/min/{1.73_m2} — ABNORMAL LOW (ref >59)
GFR calc non Af Amer: 38 mL/min/{1.73_m2} — ABNORMAL LOW (ref >59)
Globulin, Total: 3.3 g/dL (ref 1.5–4.5)
Glucose: 218 mg/dL — ABNORMAL HIGH (ref 65–99)
Potassium: 4.7 mmol/L (ref 3.5–5.2)
Sodium: 134 mmol/L (ref 134–144)
Total Protein: 7.3 g/dL (ref 6.0–8.5)

## 2019-07-10 LAB — LIPID PANEL
Chol/HDL Ratio: 3 ratio (ref 0.0–4.4)
Cholesterol, Total: 89 mg/dL — ABNORMAL LOW (ref 100–199)
HDL: 30 mg/dL — ABNORMAL LOW (ref >39)
LDL Chol Calc (NIH): 31 mg/dL (ref 0–99)
Triglycerides: 169 mg/dL — ABNORMAL HIGH (ref 0–149)
VLDL Cholesterol Cal: 28 mg/dL (ref 5–40)

## 2019-07-21 ENCOUNTER — Telehealth: Payer: Self-pay | Admitting: Nurse Practitioner

## 2019-07-21 NOTE — Telephone Encounter (Signed)
Aware this is on providers desk to be completed

## 2019-07-21 NOTE — Telephone Encounter (Signed)
I signed everything that was in my office yesterda

## 2019-07-22 ENCOUNTER — Telehealth: Payer: Self-pay | Admitting: Nurse Practitioner

## 2019-07-23 NOTE — Telephone Encounter (Signed)
Message left that paper work was ready to be picked up.

## 2019-10-11 ENCOUNTER — Other Ambulatory Visit: Payer: Self-pay

## 2019-10-12 ENCOUNTER — Encounter: Payer: Self-pay | Admitting: Nurse Practitioner

## 2019-10-12 ENCOUNTER — Ambulatory Visit: Payer: Self-pay | Admitting: Nurse Practitioner

## 2019-11-02 ENCOUNTER — Other Ambulatory Visit: Payer: Self-pay

## 2019-11-03 ENCOUNTER — Encounter: Payer: Self-pay | Admitting: Nurse Practitioner

## 2019-11-03 ENCOUNTER — Ambulatory Visit (INDEPENDENT_AMBULATORY_CARE_PROVIDER_SITE_OTHER): Payer: Medicare Other | Admitting: Nurse Practitioner

## 2019-11-03 VITALS — BP 142/82 | HR 48 | Temp 97.3°F | Resp 20 | Ht 59.0 in | Wt 123.0 lb

## 2019-11-03 DIAGNOSIS — I6930 Unspecified sequelae of cerebral infarction: Secondary | ICD-10-CM

## 2019-11-03 DIAGNOSIS — K219 Gastro-esophageal reflux disease without esophagitis: Secondary | ICD-10-CM | POA: Diagnosis not present

## 2019-11-03 DIAGNOSIS — E119 Type 2 diabetes mellitus without complications: Secondary | ICD-10-CM | POA: Diagnosis not present

## 2019-11-03 DIAGNOSIS — F3342 Major depressive disorder, recurrent, in full remission: Secondary | ICD-10-CM

## 2019-11-03 DIAGNOSIS — N1831 Chronic kidney disease, stage 3a: Secondary | ICD-10-CM

## 2019-11-03 DIAGNOSIS — E785 Hyperlipidemia, unspecified: Secondary | ICD-10-CM | POA: Diagnosis not present

## 2019-11-03 DIAGNOSIS — R609 Edema, unspecified: Secondary | ICD-10-CM

## 2019-11-03 DIAGNOSIS — I10 Essential (primary) hypertension: Secondary | ICD-10-CM | POA: Diagnosis not present

## 2019-11-03 DIAGNOSIS — I2510 Atherosclerotic heart disease of native coronary artery without angina pectoris: Secondary | ICD-10-CM

## 2019-11-03 DIAGNOSIS — E7800 Pure hypercholesterolemia, unspecified: Secondary | ICD-10-CM

## 2019-11-03 DIAGNOSIS — F411 Generalized anxiety disorder: Secondary | ICD-10-CM

## 2019-11-03 DIAGNOSIS — Z794 Long term (current) use of insulin: Secondary | ICD-10-CM

## 2019-11-03 LAB — BAYER DCA HB A1C WAIVED: HB A1C (BAYER DCA - WAIVED): 8 % — ABNORMAL HIGH (ref <7.0)

## 2019-11-03 MED ORDER — METOPROLOL SUCCINATE ER 100 MG PO TB24: ORAL_TABLET | ORAL | 1 refills | Status: DC

## 2019-11-03 MED ORDER — OLMESARTAN MEDOXOMIL 40 MG PO TABS: 40.0000 mg | ORAL_TABLET | Freq: Every day | ORAL | 1 refills | Status: DC

## 2019-11-03 MED ORDER — INSULIN GLARGINE 100 UNIT/ML ~~LOC~~ SOLN: 45.0000 [IU] | Freq: Every morning | SUBCUTANEOUS | 5 refills | Status: DC

## 2019-11-03 MED ORDER — POTASSIUM CHLORIDE ER 10 MEQ PO TBCR: 10.0000 meq | EXTENDED_RELEASE_TABLET | Freq: Every day | ORAL | 1 refills | Status: DC

## 2019-11-03 MED ORDER — FUROSEMIDE 20 MG PO TABS: 20.0000 mg | ORAL_TABLET | Freq: Every day | ORAL | 1 refills | Status: DC

## 2019-11-03 MED ORDER — AMLODIPINE BESYLATE 10 MG PO TABS: 10.0000 mg | ORAL_TABLET | Freq: Every day | ORAL | 1 refills | Status: DC

## 2019-11-03 MED ORDER — ATORVASTATIN CALCIUM 10 MG PO TABS: 10.0000 mg | ORAL_TABLET | Freq: Every day | ORAL | 1 refills | Status: DC

## 2019-11-03 MED ORDER — LORAZEPAM 0.5 MG PO TABS: ORAL_TABLET | ORAL | 5 refills | Status: DC

## 2019-11-03 MED ORDER — CITALOPRAM HYDROBROMIDE 20 MG PO TABS: 20.0000 mg | ORAL_TABLET | Freq: Every day | ORAL | 1 refills | Status: DC

## 2019-11-03 MED ORDER — PANTOPRAZOLE SODIUM 40 MG PO TBEC: 40.0000 mg | DELAYED_RELEASE_TABLET | Freq: Every day | ORAL | 1 refills | Status: DC

## 2019-11-03 NOTE — Progress Notes (Addendum)
Subjective:    Patient ID: Kara Dean, female    DOB: [birth year], 85 y.o.   MRN: [id number]   Chief Complaint: Medical Management of Chronic Issues    HPI:  1. Essential hypertension No c/o chest pain, sob or headache. Does nt check blod pressure at bhome. BP Readings from Last 3 Encounters:  07/09/19 (!) 139/55  04/12/19 (!) 138/52  03/09/19 (!) 157/61   Pulse Readings from Last 3 Encounters:  11/03/19 (!) 48  07/09/19 (!) 48  04/12/19 (!) 57     2. Hyperlipidemia, unspecified hyperlipidemia type She eats whatever her family has for her t oeat. She does not do very much exercise. Lab Results  Component Value Date   CHOL 89 (L) 07/09/2019   HDL 30 (L) 07/09/2019   LDLCALC 31 07/09/2019   TRIG 169 (H) 07/09/2019   CHOLHDL 3.0 07/09/2019     3. Type 2 diabetes mellitus treated with insulin (HCC) Fasting blood sugars is always over 200. She does not watch diet and does no exercise.denies any low blood usgar readings. Lab Results  Component Value Date   HGBA1C 7.3 (H) 07/09/2019     4. Gastroesophageal reflux disease without esophagitis Is on protonix daily and works well to keep symptoms under control.  5. Chronic kidney disease (CKD) stage G3a/A3, moderately decreased glomerular filtration rate (GFR) between 45-59 mL/min/1.73 square meter and albuminuria creatinine ratio greater than 300 mg/g No problems voiding Lab Results  Component Value Date   CREATININE 1.30 (H) 07/09/2019     6. Recurrent major depressive disorder, in full remission ([place]) Takes celexa daily and is working well without side effects Depression screen [facility] 2/9 11/03/2019 07/09/2019 04/12/2019  Decreased Interest 0 0 0  Down, Depressed, Hopeless 0 0 0  PHQ - 2 Score 0 0 0  Altered sleeping - - -  Tired, decreased energy - - -  Change in appetite - - -  Feeling bad or failure about yourself  - - -  Trouble concentrating - - -  Moving slowly or fidgety/restless - - -  Suicidal  thoughts - - -  PHQ-9 Score - - -  Difficult doing work/chores - - -  Some recent data might be hidden    7. Peripheral edema Has daily lower ext edema. Resolves some at night.  8. GAD (generalized anxiety disorder) Takes ativan BID. Works well for her. Has been taking for many years.    9. Late effect of cerebrovascular accident (CVA) She says she is well. Has no residual effects from stroke in 2011.    Outpatient Encounter Medications as of 11/03/2019  Medication Sig  . amLODipine (NORVASC) 10 MG tablet Take 1 tablet (10 mg total) by mouth daily.  Marland Kitchen atorvastatin (LIPITOR) 10 MG tablet Take 1 tablet (10 mg total) by mouth daily.  . B Complex Vitamins (B COMPLEX-B12) TABS Take 1 tablet by mouth daily.    . Cholecalciferol (VITAMIN D3 PO) Take 1 capsule daily by mouth.  . citalopram (CELEXA) 20 MG tablet Take 1 tablet (20 mg total) by mouth daily.  Marland Kitchen dipyridamole-aspirin (AGGRENOX) 200-25 MG 12hr capsule TAKE  (1)  CAPSULE  TWICE DAILY.  . fluticasone (FLONASE) 50 MCG/ACT nasal spray USE 2 SPRAYS IN EACH NOSTRIL ONCE DAILY.  . furosemide (LASIX) 20 MG tablet Take 1 tablet (20 mg total) by mouth daily.  Marland Kitchen glucose blood (ONE TOUCH ULTRA TEST) test strip CHECK BLOOD SUGER UP TO 3 TIMES A DAY Dx: E11.9  .  insulin glargine (LANTUS) 100 UNIT/ML injection Inject 0.4 mLs (40 Units total) into the skin every morning.  Marland Kitchen LORazepam (ATIVAN) 0.5 MG tablet TAKE (1) TABLET TWICE A DAY.  . Magnesium 250 MG TABS Take 250 mg daily by mouth.   . metoprolol succinate (TOPROL-XL) 100 MG 24 hr tablet Take with or immediately following a meal.  . olmesartan (BENICAR) 40 MG tablet Take 1 tablet (40 mg total) by mouth daily.  . Omega-3 Fatty Acids (FISH OIL) 1000 MG CAPS Take 1 capsule by mouth daily.  . pantoprazole (PROTONIX) 40 MG tablet Take 1 tablet (40 mg total) by mouth daily.  . potassium chloride (K-DUR) 10 MEQ tablet Take 1 tablet (10 mEq total) by mouth daily.     Past Surgical History:    Procedure Laterality Date  . ABDOMINAL HYSTERECTOMY    . BACK SURGERY  1994  . CATARACT EXTRACTION W/PHACO Left 09/19/2017   Procedure: CATARACT EXTRACTION PHACO AND INTRAOCULAR LENS PLACEMENT (IOC);  Surgeon: Baruch Goldmann, MD;  Location: [facility];  Service: Ophthalmology;  Laterality: Left;  CDE: 8.10  . CATARACT EXTRACTION W/PHACO Right 10/10/2017   Procedure: CATARACT EXTRACTION PHACO AND INTRAOCULAR LENS PLACEMENT RIGHT EYE;  Surgeon: Baruch Goldmann, MD;  Location: [facility];  Service: Ophthalmology;  Laterality: Right;  CDE: 9.60  . COLON RESECTION  1990's  . TUBAL LIGATION    . VESICOVAGINAL FISTULA CLOSURE W/ TAH  1984    Family History  Problem Relation Age of Onset  . Coronary artery disease Sister        stent x 3  . Seizures Sister   . Coronary artery disease Brother        stent  . Hypertension Mother   . Stroke Mother   . Emphysema Father   . Alcohol abuse Brother   . Lung disease Brother   . Heart attack Daughter     New complaints: None today  Social history: Use to live by herself- moved into a new apartment and her granddaughter is now staying with her.  Controlled substance contract: 07/13/19    Review of Systems  Constitutional: Negative for diaphoresis.  Eyes: Negative for pain.  Respiratory: Negative for shortness of breath.   Cardiovascular: Negative for chest pain, palpitations and leg swelling.  Gastrointestinal: Negative for abdominal pain.  Endocrine: Negative for polydipsia.  Skin: Negative for rash.  Neurological: Negative for dizziness, weakness and headaches.  Hematological: Does not bruise/bleed easily.  All other systems reviewed and are negative.      Objective:   Physical Exam Vitals and nursing note reviewed.  Constitutional:      General: She is not in acute distress.    Appearance: Normal appearance. She is well-developed.  HENT:     Head: Normocephalic.     Nose: Nose normal.  Eyes:     Pupils: Pupils are equal, round, and  reactive to light.  Neck:     Vascular: No carotid bruit or JVD.  Cardiovascular:     Rate and Rhythm: Normal rate and regular rhythm.     Heart sounds: Normal heart sounds.  Pulmonary:     Effort: Pulmonary effort is normal. No respiratory distress.     Breath sounds: Rales (bil bases) present. No wheezing.  Chest:     Chest wall: No tenderness.  Abdominal:     General: Bowel sounds are normal. There is no distension or abdominal bruit.     Palpations: Abdomen is soft. There is no hepatomegaly, splenomegaly, mass  or pulsatile mass.     Tenderness: There is no abdominal tenderness.  Musculoskeletal:        General: Normal range of motion.     Cervical back: Normal range of motion and neck supple.     Right lower leg: Edema (1+) present.     Left lower leg: Edema (1+) present.  Lymphadenopathy:     Cervical: No cervical adenopathy.  Skin:    General: Skin is warm and dry.  Neurological:     Mental Status: She is alert and oriented to person, place, and time.     Deep Tendon Reflexes: Reflexes are normal and symmetric.  Psychiatric:        Behavior: Behavior normal.        Thought Content: Thought content normal.        Judgment: Judgment normal.   BP (!) 142/82 (BP Location: Left Arm, Cuff Size: Normal)   Pulse (!) 48   Temp (!) 97.3 F (36.3 C) (Temporal)   Resp 20   Ht _0  (1.499 m)   Wt 123 lb (55.8 kg)   SpO2 96%   BMI 24.84 kg/m    hgba1c 8.0      Assessment & Plan:  Kara Dean comes in today with chief complaint of Medical Management of Chronic Issues   Diagnosis and orders addressed:  1. Essential hypertension Low sodium diet - CMP14+EGFR - olmesartan (BENICAR) 40 MG tablet; Take 1 tablet (40 mg total) by mouth daily.  Dispense: 90 tablet; Refill: 1 - amLODipine (NORVASC) 10 MG tablet; Take 1 tablet (10 mg total) by mouth daily.  Dispense: 90 tablet; Refill: 1 - potassium chloride (KLOR-CON) 10 MEQ tablet; Take 1 tablet (10 mEq total) by mouth  daily.  Dispense: 90 tablet; Refill: 1  2. Hyperlipidemia, unspecified hyperlipidemia type Low fat diet - Lipid panel  3. Type 2 diabetes mellitus treated with insulin (HCC) Stricter arb counting Increased lantus to 45u daily - hgba1c - Microalbumin / creatinine urine ratio - insulin glargine (LANTUS) 100 UNIT/ML injection; Inject 0.45 mLs (45 Units total) into the skin every morning.  Dispense: 30 mL; Refill: 5  4. Gastroesophageal reflux disease without esophagitis Avoid spicy foods Do not eat 2 hours prior to bedtime - pantoprazole (PROTONIX) 40 MG tablet; Take 1 tablet (40 mg total) by mouth daily.  Dispense: 90 tablet; Refill: 1  5. Chronic kidney disease (CKD) stage G3a/A3, moderately decreased glomerular filtration rate (GFR) between 45-59 mL/min/1.73 square meter and albuminuria creatinine ratio greater than 300 mg/g Labs pending  6. Recurrent major depressive disorder, in full remission ([place]) Stress management - citalopram (CELEXA) 20 MG tablet; Take 1 tablet (20 mg total) by mouth daily.  Dispense: 90 tablet; Refill: 1  7. Peripheral edema Elevate legs when sitting - furosemide (LASIX) 20 MG tablet; Take 1 tablet (20 mg total) by mouth daily.  Dispense: 90 tablet; Refill: 1  8. GAD (generalized anxiety disorder) - LORazepam (ATIVAN) 0.5 MG tablet; TAKE (1) TABLET TWICE A DAY.  Dispense: 60 tablet; Refill: 5  9. Late effect of cerebrovascular accident (CVA) Fall prevention  10. Coronary artery disease involving native coronary artery of native heart without angina pectoris Really need tio follow up with cardiology - metoprolol succinate (TOPROL-XL) 100 MG 24 hr tablet; Take with or immediately following a meal.  Dispense: 90 tablet; Refill: 1  11. Pure hypercholesterolemia Low fat diet - atorvastatin (LIPITOR) 10 MG tablet; Take 1 tablet (10 mg total) by mouth daily.  Dispense: 90 tablet; Refill: 1   Labs pending Health Maintenance reviewed Diet and exercise  encouraged  Follow up plan: 3 months   [name], FNP

## 2019-11-03 NOTE — Patient Instructions (Signed)

## 2019-11-04 LAB — CMP14+EGFR
ALT: 10 IU/L (ref 0–32)
AST: 17 IU/L (ref 0–40)
Albumin/Globulin Ratio: 1.4 (ref 1.2–2.2)
Albumin: 4.1 g/dL (ref 3.6–4.6)
Alkaline Phosphatase: 75 IU/L (ref 39–117)
BUN/Creatinine Ratio: 11 — ABNORMAL LOW (ref 12–28)
BUN: 13 mg/dL (ref 8–27)
Bilirubin Total: 0.5 mg/dL (ref 0.0–1.2)
CO2: 22 mmol/L (ref 20–29)
Calcium: 9.9 mg/dL (ref 8.7–10.3)
Chloride: 101 mmol/L (ref 96–106)
Creatinine, Ser: 1.21 mg/dL — ABNORMAL HIGH (ref 0.57–1.00)
GFR calc Af Amer: 47 mL/min/{1.73_m2} — ABNORMAL LOW (ref >59)
GFR calc non Af Amer: 41 mL/min/{1.73_m2} — ABNORMAL LOW (ref >59)
Globulin, Total: 2.9 g/dL (ref 1.5–4.5)
Glucose: 206 mg/dL — ABNORMAL HIGH (ref 65–99)
Potassium: 4.2 mmol/L (ref 3.5–5.2)
Sodium: 139 mmol/L (ref 134–144)
Total Protein: 7 g/dL (ref 6.0–8.5)

## 2019-11-04 LAB — LIPID PANEL
Chol/HDL Ratio: 2.5 ratio (ref 0.0–4.4)
Cholesterol, Total: 111 mg/dL (ref 100–199)
HDL: 45 mg/dL (ref >39)
LDL Chol Calc (NIH): 38 mg/dL (ref 0–99)
Triglycerides: 167 mg/dL — ABNORMAL HIGH (ref 0–149)
VLDL Cholesterol Cal: 28 mg/dL (ref 5–40)

## 2019-11-11 DIAGNOSIS — E1142 Type 2 diabetes mellitus with diabetic polyneuropathy: Secondary | ICD-10-CM | POA: Diagnosis not present

## 2019-11-11 DIAGNOSIS — B351 Tinea unguium: Secondary | ICD-10-CM | POA: Diagnosis not present

## 2019-11-11 DIAGNOSIS — L84 Corns and callosities: Secondary | ICD-10-CM | POA: Diagnosis not present

## 2019-11-11 DIAGNOSIS — M79676 Pain in unspecified toe(s): Secondary | ICD-10-CM | POA: Diagnosis not present

## 2019-11-19 ENCOUNTER — Other Ambulatory Visit: Payer: Self-pay

## 2019-11-19 ENCOUNTER — Ambulatory Visit (INDEPENDENT_AMBULATORY_CARE_PROVIDER_SITE_OTHER): Payer: Medicare Other | Admitting: Nurse Practitioner

## 2019-11-19 ENCOUNTER — Encounter: Payer: Self-pay | Admitting: Nurse Practitioner

## 2019-11-19 VITALS — BP 152/73 | HR 50 | Temp 97.1°F | Resp 20 | Ht 59.0 in | Wt 123.0 lb

## 2019-11-19 DIAGNOSIS — L03119 Cellulitis of unspecified part of limb: Secondary | ICD-10-CM | POA: Diagnosis not present

## 2019-11-19 MED ORDER — CEPHALEXIN 500 MG PO CAPS: 500.0000 mg | ORAL_CAPSULE | Freq: Four times a day (QID) | ORAL | 0 refills | Status: DC

## 2019-11-19 MED ORDER — CEPHALEXIN 500 MG PO CAPS: 500.0000 mg | ORAL_CAPSULE | Freq: Two times a day (BID) | ORAL | 0 refills | Status: DC

## 2019-11-19 NOTE — Patient Instructions (Signed)
[facility] An [employer] is a type of bandage (dressing) for the foot and leg. The dressing is a gauze wrap that is soaked with a type of medicine called zinc oxide. The gauze may also include other lotions and medicines that help in wound healing, such as calamine. An Unna boot may be used to treat:  Open sores (ulcers) on the foot, heel, or leg.  Swelling from disorders that affect the veins or lymphatic system (lymphedema).  Skin conditions such as chronic inflammation caused by poor blood flow (stasis dermatitis). The dressing is applied by a health care provider. The gauze is wrapped around your lower extremity in several layers, usually starting at the toes and going upward to the knee. A dry outer wrap goes over the medicated wrap for support and compression.  Before applying the [employer], your health care provider will clean your leg and foot and may apply an antibiotic ointment. You may be asked to raise (elevate) your leg for a while to reduce swelling before the boot is applied. The boot will dry and harden after it is applied. The boot may need to be changed or replaced about twice a week. Follow these instructions at home: [place] as told by your health care provider.  You may need to wear a slipper or shoe over the boot that is one or two sizes larger than normal.  Check the skin around the boot every day. Tell your health care provider about any concerns.  Do not stick anything inside the boot to scratch your skin. Doing that increases your risk of infection.  Keep your [employer] clean and dry.  Check every day for signs of infection. Check for: ? Redness, swelling, or pain in your foot or toes. ? Fluid or blood coming from the boot. ? Pus or a bad smell coming from the boot.  Remove the boot and call your health care provider if you have signs of poor blood flow, such as: ? Your toes tingle or become numb. ? Your toes turn cold or turn blue or  pale. ? Your toes are more swollen or painful. ? You are unable to move your toes. Activity  You may walk with the boot once it has dried. Ask your health care provider how much walking is safe for you.  Avoid sitting for a long time without moving. Get up to take short walks as told by your health care provider. This is important to improve blood flow. Bathing  Do not take baths, swim, or use a hot tub until your health care provider approves. Ask your health care provider if you may take showers.  If your health care provider approves a bath or a shower, do not let the Unna boot get wet. ? If you take a shower, cover the boot with a watertight covering. ? If you take a bath, keep your leg with the boot out of the tub. General instructions  Keep your leg elevated above the level of your heart while you are sitting or lying down. This will decrease swelling.  Do not sit with your knee bent for long periods of time.  Take over-the-counter and prescription medicines only as told by your health care provider.  Do not use any products that contain nicotine or tobacco, such as cigarettes, e-cigarettes, and chewing tobacco. These can delay healing. If you need help quitting, ask your health care provider.  Keep all follow-up visits as  told by your health care provider. This is important. Contact a health care provider if:  Your skin feels itchy inside the boot.  You have a burning sensation, a rash, or itchy, red, swollen areas of skin (hives) in the boot area.  You have a fever or chills.  You have any signs of infection, such as: ? New redness, swelling, or pain. ? More fluid or blood coming from the boot. ? Pus or a bad smell coming from the boot.  You have increased numbness or pain in your foot or toes.  You have any changes in skin color on your foot or toes, such as the skin turning blue or pale or developing patchy areas with spots.  Your boot has been damaged or feels  like it is no longer fitting properly. Summary  An Lou[place]ta [place] boot is a type of bandage (dressing) system for the foot and leg.  The dressing is a gauze wrap that is soaked with a type of medicine (zinc oxide) to treat foot, heel, or leg ulcers, swelling from disorders that affect the veins or lymphatic system (lymphedema), and skin conditions caused by poor blood flow (stasis dermatitis).  This dressing is applied by a health care provider. After it is applied, the boot will dry and harden.  The boot may need to be changed or replaced about twice a week.  Let your health care provider know if you have any signs of poor blood flow or infection. This information is not intended to replace advice given to you by your health care provider. Make sure you discuss any questions you have with your health care provider. Document Revised: 02/09/2019 Document Reviewed: 07/01/2018 Elsevier Patient Education  2020 [employer].

## 2019-11-19 NOTE — Progress Notes (Signed)
   Subjective:    Patient ID: Kara Dean, female    DOB: 1933/06/26, 86 y.o.   MRN: [id number]   Chief Complaint: Left leg leaking fluid   HPI Patient is brought in by her daughter with c/o left leg leaking. Slightly swollen Noticed it last night. Denies any lesions. Says that legs are swollen by the end of everyday.   Review of Systems  Constitutional: Negative for diaphoresis.  Eyes: Negative for pain.  Respiratory: Negative for shortness of breath.   Cardiovascular: Positive for leg swelling (left). Negative for chest pain and palpitations.  Gastrointestinal: Negative for abdominal pain.  Endocrine: Negative for polydipsia.  Skin: Negative for rash.  Neurological: Negative for dizziness, weakness and headaches.  Hematological: Does not bruise/bleed easily.  All other systems reviewed and are negative.      Objective:   Physical Exam Vitals and nursing note reviewed.  Constitutional:      General: She is not in acute distress.    Appearance: Normal appearance. She is well-developed.  HENT:     Head: Normocephalic.     Nose: Nose normal.  Eyes:     Pupils: Pupils are equal, round, and reactive to light.  Neck:     Vascular: No carotid bruit or JVD.  Cardiovascular:     Rate and Rhythm: Normal rate and regular rhythm.     Heart sounds: Normal heart sounds.  Pulmonary:     Effort: Pulmonary effort is normal. No respiratory distress.     Breath sounds: Normal breath sounds. No wheezing or rales.  Chest:     Chest wall: No tenderness.  Abdominal:     General: Bowel sounds are normal. There is no distension or abdominal bruit.     Palpations: Abdomen is soft. There is no hepatomegaly, splenomegaly, mass or pulsatile mass.     Tenderness: There is no abdominal tenderness.  Musculoskeletal:        General: Normal range of motion.     Cervical back: Normal range of motion and neck supple.     Right lower leg: Edema (1+) present.     Left lower leg: Edema (1+ with  fluid draining) present.  Lymphadenopathy:     Cervical: No cervical adenopathy.  Skin:    General: Skin is warm and dry.  Neurological:     Mental Status: She is alert and oriented to person, place, and time.     Deep Tendon Reflexes: Reflexes are normal and symmetric.  Psychiatric:        Behavior: Behavior normal.        Thought Content: Thought content normal.        Judgment: Judgment normal.     BP (!) 152/73   Pulse (!) 50   Temp (!) 97.1 F (36.2 C) (Temporal)   Resp 20   Ht 4' 11"  (1.499 m)   Wt 123 lb (55.8 kg)   SpO2 98%   BMI 24.84 kg/m        Assessment & Plan:  Kara Dean in today with chief complaint of Left leg leaking fluid   1. Cellulitis of lower extremity, unspecified laterality Take extra lasix at lunch time or 3 days  Unna boots bi elevate legs when sitting - cephALEXin (KEFLEX) 500 MG capsule; Take 1 capsule (500 mg total) by mouth 2 (four) times daily.  Dispense: 24 capsule; Refill: 0  [name], FNP

## 2019-11-22 ENCOUNTER — Other Ambulatory Visit: Payer: Self-pay

## 2019-11-23 ENCOUNTER — Ambulatory Visit (INDEPENDENT_AMBULATORY_CARE_PROVIDER_SITE_OTHER): Payer: Medicare Other | Admitting: Nurse Practitioner

## 2019-11-23 ENCOUNTER — Encounter: Payer: Self-pay | Admitting: Nurse Practitioner

## 2019-11-23 VITALS — BP 133/51 | HR 55 | Temp 97.7°F | Ht 59.0 in | Wt 125.0 lb

## 2019-11-23 DIAGNOSIS — Z872 Personal history of diseases of the skin and subcutaneous tissue: Secondary | ICD-10-CM

## 2019-11-23 NOTE — Progress Notes (Signed)
Subjective:    Patient ID: Kara Dean, female    DOB: [birth year], 86 y.o.   MRN: [id number]   Chief Complaint: Recheck legs     Chief Complaint: Recheck legs   HPI Patient was seen last Friday with cellulitis of left lower leg and swelling of right lower leg. She was given keflex and una boots were applied. She is here today for recheck of legs.     Outpatient Encounter Medications as of 11/23/2019  Medication Sig  . amLODipine (NORVASC) 10 MG tablet Take 1 tablet (10 mg total) by mouth daily.  Marland Kitchen atorvastatin (LIPITOR) 10 MG tablet Take 1 tablet (10 mg total) by mouth daily.  . B Complex Vitamins (B COMPLEX-B12) TABS Take 1 tablet by mouth daily.    . cephALEXin (KEFLEX) 500 MG capsule Take 1 capsule (500 mg total) by mouth 4 (four) times daily.  . Cholecalciferol (VITAMIN D3 PO) Take 1 capsule daily by mouth.  . citalopram (CELEXA) 20 MG tablet Take 1 tablet (20 mg total) by mouth daily.  Marland Kitchen dipyridamole-aspirin (AGGRENOX) 200-25 MG 12hr capsule TAKE  (1)  CAPSULE  TWICE DAILY.  . fluticasone (FLONASE) 50 MCG/ACT nasal spray USE 2 SPRAYS IN EACH NOSTRIL ONCE DAILY.  . furosemide (LASIX) 20 MG tablet Take 1 tablet (20 mg total) by mouth daily.  Marland Kitchen glucose blood (ONE TOUCH ULTRA TEST) test strip CHECK BLOOD SUGER UP TO 3 TIMES A DAY Dx: E11.9  . insulin glargine (LANTUS) 100 UNIT/ML injection Inject 0.45 mLs (45 Units total) into the skin every morning.  Marland Kitchen LORazepam (ATIVAN) 0.5 MG tablet TAKE (1) TABLET TWICE A DAY.  . Magnesium 250 MG TABS Take 250 mg daily by mouth.   . metoprolol succinate (TOPROL-XL) 100 MG 24 hr tablet Take with or immediately following a meal.  . olmesartan (BENICAR) 40 MG tablet Take 1 tablet (40 mg total) by mouth daily.  . Omega-3 Fatty Acids (FISH OIL) 1000 MG CAPS Take 1 capsule by mouth daily.  . pantoprazole (PROTONIX) 40 MG tablet Take 1 tablet (40 mg total) by mouth daily.  . potassium chloride (KLOR-CON) 10 MEQ tablet Take 1 tablet (10 mEq  total) by mouth daily.     Past Surgical History:  Procedure Laterality Date  . ABDOMINAL HYSTERECTOMY    . BACK SURGERY  1994  . CATARACT EXTRACTION W/PHACO Left 09/19/2017   Procedure: CATARACT EXTRACTION PHACO AND INTRAOCULAR LENS PLACEMENT (IOC);  Surgeon: Baruch Goldmann, MD;  Location: [facility];  Service: Ophthalmology;  Laterality: Left;  CDE: 8.10  . CATARACT EXTRACTION W/PHACO Right 10/10/2017   Procedure: CATARACT EXTRACTION PHACO AND INTRAOCULAR LENS PLACEMENT RIGHT EYE;  Surgeon: Baruch Goldmann, MD;  Location: [facility];  Service: Ophthalmology;  Laterality: Right;  CDE: 9.60  . COLON RESECTION  1990's  . TUBAL LIGATION    . VESICOVAGINAL FISTULA CLOSURE W/ TAH  1984    Family History  Problem Relation Age of Onset  . Coronary artery disease Sister        stent x 3  . Seizures Sister   . Coronary artery disease Brother        stent  . Hypertension Mother   . Stroke Mother   . Emphysema Father   . Alcohol abuse Brother   . Lung disease Brother   . Heart attack Daughter     New complaints: None   Social history: Lives in apartment with her granddaughter  Controlled substance contract: n/a    Review of  Systems  Constitutional: Negative for diaphoresis.  Eyes: Negative for pain.  Respiratory: Negative for shortness of breath.   Cardiovascular: Negative for chest pain, palpitations and leg swelling.  Gastrointestinal: Negative for abdominal pain.  Endocrine: Negative for polydipsia.  Skin: Negative for rash.  Neurological: Negative for dizziness, weakness and headaches.  Hematological: Does not bruise/bleed easily.  All other systems reviewed and are negative.      Objective:   Physical Exam Vitals and nursing note reviewed.  Constitutional:      Appearance: Normal appearance.  Cardiovascular:     Rate and Rhythm: Normal rate and regular rhythm.     Heart sounds: Normal heart sounds.  Pulmonary:     Breath sounds: Normal breath sounds.    Musculoskeletal:     Right lower leg: No edema.     Left lower leg: No edema.  Skin:    General: Skin is warm and dry.  Neurological:     General: No focal deficit present.     Mental Status: She is alert and oriented to person, place, and time.  Psychiatric:        Mood and Affect: Mood normal.        Behavior: Behavior normal.    BP (!) 133/51   Pulse (!) 55   Temp 97.7 F (36.5 C) (Oral)   Ht 4' 11"  (1.499 m)   Wt 125 lb (56.7 kg)   BMI 25.25 kg/m        Assessment & Plan:  Kara Dean in today with chief complaint of Recheck legs   1. History of cellulitis Work on elevatng legs when sitting Finish antibiotics RTO prn  [name], [place]

## 2019-11-23 NOTE — Patient Instructions (Signed)
Peripheral Edema  Peripheral edema is swelling that is caused by a buildup of fluid. Peripheral edema most often affects the lower legs, ankles, and feet. It can also develop in the arms, hands, and face. The area of the body that has peripheral edema will look swollen. It may also feel heavy or warm. Your clothes may start to feel tight. Pressing on the area may make a temporary dent in your skin. You may not be able to move your swollen arm or leg as much as usual. There are many causes of peripheral edema. It can happen because of a complication of other conditions such as congestive heart failure, kidney disease, or a problem with your blood circulation. It also can be a side effect of certain medicines or because of an infection. It often happens to women during pregnancy. Sometimes, the cause is not known. Follow these instructions at home: Managing pain, stiffness, and swelling   Raise (elevate) your legs while you are sitting or lying down.  Move around often to prevent stiffness and to lessen swelling.  Do not sit or stand for long periods of time.  Wear support stockings as told by your health care provider. Medicines  Take over-the-counter and prescription medicines only as told by your health care provider.  Your health care provider may prescribe medicine to help your body get rid of excess water (diuretic). General instructions  Pay attention to any changes in your symptoms.  Follow instructions from your health care provider about limiting salt (sodium) in your diet. Sometimes, eating less salt may reduce swelling.  Moisturize skin daily to help prevent skin from cracking and draining.  Keep all follow-up visits as told by your health care provider. This is important. Contact a health care provider if you have:  A fever.  Edema that starts suddenly or is getting worse, especially if you are pregnant or have a medical condition.  Swelling in only one leg.  Increased  swelling, redness, or pain in one or both of your legs.  Drainage or sores at the area where you have edema. Get help right away if you:  Develop shortness of breath, especially when you are lying down.  Have pain in your chest or abdomen.  Feel weak.  Feel faint. Summary  Peripheral edema is swelling that is caused by a buildup of fluid. Peripheral edema most often affects the lower legs, ankles, and feet.  Move around often to prevent stiffness and to lessen swelling. Do not sit or stand for long periods of time.  Pay attention to any changes in your symptoms.  Contact a health care provider if you have edema that starts suddenly or is getting worse, especially if you are pregnant or have a medical condition.  Get help right away if you develop shortness of breath, especially when lying down. This information is not intended to replace advice given to you by your health care provider. Make sure you discuss any questions you have with your health care provider. Document Revised: 07/15/2018 Document Reviewed: 07/15/2018 Elsevier Patient Education  2020 Elsevier Inc.  

## 2019-12-14 ENCOUNTER — Telehealth: Payer: Self-pay | Admitting: Cardiovascular Disease

## 2019-12-14 NOTE — Telephone Encounter (Signed)
New message  Patient's daughter will be coming in with her visit with Dr. Burt Knack  on Friday per daughter the patient gets confused.

## 2019-12-14 NOTE — Telephone Encounter (Signed)
I spoke to the patient's daughter and ok'd her accompanying the patient to her appointment on 2/12.

## 2019-12-17 ENCOUNTER — Ambulatory Visit: Payer: Medicare Other | Admitting: Cardiovascular Disease

## 2019-12-30 ENCOUNTER — Telehealth: Payer: Self-pay | Admitting: Nurse Practitioner

## 2019-12-30 NOTE — Chronic Care Management (AMB) (Signed)
  Chronic Care Management   Outreach Note  12/30/2019 Name: MIALYNN SHELVIN MRN: [id number] DOB: 03/08/1933  Ellene Route Wheless is a 86 y.o. year old female who is a primary care patient of [name], [place]. I reached out to [name] by phone today in response to a referral sent by Ms. Sheri P Lofgren's health plan.     An unsuccessful telephone outreach was attempted today.spoke with daughter Vickii Chafe. The patient was referred to the case management team for assistance with care management and care coordination.   Follow Up Plan: A HIPPA compliant phone message was left with patient's Daughter Vickii Chafe   providing contact information and requesting a return call.  The care management team will reach out to the patient again over the next 7 days.  If patient returns call to provider office, please advise to call [facility]  at [facility], [place], [place], [place] [address] Direct Dial: [phone] [email or web address] Website: [email or web address]

## 2019-12-31 NOTE — Chronic Care Management (AMB) (Signed)
  Chronic Care Management   Note  12/31/2019 Name: SARAANN ENNEKING MRN: [id number] DOB: 1933/04/07  Ellene Route Deutscher is a 86 y.o. year old female who is a primary care patient of [name], [place]. I reached out to [name] by phone today in response to a referral sent by Ms. Quianna P Marohl's health plan.     Ms. Aponte was given information about Chronic Care Management services today including:  1. CCM service includes personalized support from designated clinical staff supervised by her physician, including individualized plan of care and coordination with other care providers 2. 24/7 contact phone numbers for assistance for urgent and routine care needs. 3. Service will only be billed when office clinical staff spend 20 minutes or more in a month to coordinate care. 4. Only one practitioner may furnish and bill the service in a calendar month. 5. The patient may stop CCM services at any time (effective at the end of the month) by phone call to the office staff. 6. The patient will be responsible for cost sharing (co-pay) of up to 20% of the service fee (after annual deductible is met).  Patient agreed to services and verbal consent obtained.   Follow up plan: Telephone appointment with care management team member scheduled for:02/18/2020  Noreene Larsson, [place], [place], [place] [address] Direct Dial: [phone] [email or web address] Website: [email or web address]

## 2020-01-11 ENCOUNTER — Other Ambulatory Visit: Payer: Self-pay | Admitting: Nurse Practitioner

## 2020-01-20 DIAGNOSIS — M79676 Pain in unspecified toe(s): Secondary | ICD-10-CM | POA: Diagnosis not present

## 2020-01-20 DIAGNOSIS — B351 Tinea unguium: Secondary | ICD-10-CM | POA: Diagnosis not present

## 2020-01-20 DIAGNOSIS — L84 Corns and callosities: Secondary | ICD-10-CM | POA: Diagnosis not present

## 2020-01-20 DIAGNOSIS — E1142 Type 2 diabetes mellitus with diabetic polyneuropathy: Secondary | ICD-10-CM | POA: Diagnosis not present

## 2020-02-01 ENCOUNTER — Ambulatory Visit (INDEPENDENT_AMBULATORY_CARE_PROVIDER_SITE_OTHER): Payer: Medicare Other | Admitting: Nurse Practitioner

## 2020-02-01 ENCOUNTER — Other Ambulatory Visit: Payer: Self-pay

## 2020-02-01 ENCOUNTER — Encounter: Payer: Self-pay | Admitting: Nurse Practitioner

## 2020-02-01 VITALS — BP 141/52 | HR 64 | Temp 97.3°F | Resp 20 | Ht 59.0 in | Wt 125.0 lb

## 2020-02-01 DIAGNOSIS — E785 Hyperlipidemia, unspecified: Secondary | ICD-10-CM | POA: Diagnosis not present

## 2020-02-01 DIAGNOSIS — F411 Generalized anxiety disorder: Secondary | ICD-10-CM

## 2020-02-01 DIAGNOSIS — I2510 Atherosclerotic heart disease of native coronary artery without angina pectoris: Secondary | ICD-10-CM

## 2020-02-01 DIAGNOSIS — N1831 Chronic kidney disease, stage 3a: Secondary | ICD-10-CM

## 2020-02-01 DIAGNOSIS — K219 Gastro-esophageal reflux disease without esophagitis: Secondary | ICD-10-CM

## 2020-02-01 DIAGNOSIS — R609 Edema, unspecified: Secondary | ICD-10-CM

## 2020-02-01 DIAGNOSIS — F3342 Major depressive disorder, recurrent, in full remission: Secondary | ICD-10-CM

## 2020-02-01 DIAGNOSIS — Z794 Long term (current) use of insulin: Secondary | ICD-10-CM

## 2020-02-01 DIAGNOSIS — I6930 Unspecified sequelae of cerebral infarction: Secondary | ICD-10-CM

## 2020-02-01 DIAGNOSIS — E119 Type 2 diabetes mellitus without complications: Secondary | ICD-10-CM

## 2020-02-01 DIAGNOSIS — E7800 Pure hypercholesterolemia, unspecified: Secondary | ICD-10-CM

## 2020-02-01 DIAGNOSIS — I10 Essential (primary) hypertension: Secondary | ICD-10-CM

## 2020-02-01 LAB — BAYER DCA HB A1C WAIVED: HB A1C (BAYER DCA - WAIVED): 7.8 % — ABNORMAL HIGH (ref <7.0)

## 2020-02-01 MED ORDER — PANTOPRAZOLE SODIUM 40 MG PO TBEC: 40.0000 mg | DELAYED_RELEASE_TABLET | Freq: Every day | ORAL | 1 refills | Status: DC

## 2020-02-01 MED ORDER — OLMESARTAN MEDOXOMIL 40 MG PO TABS: 40.0000 mg | ORAL_TABLET | Freq: Every day | ORAL | 1 refills | Status: DC

## 2020-02-01 MED ORDER — METOPROLOL SUCCINATE ER 100 MG PO TB24: ORAL_TABLET | ORAL | 1 refills | Status: DC

## 2020-02-01 MED ORDER — ATORVASTATIN CALCIUM 10 MG PO TABS: 10.0000 mg | ORAL_TABLET | Freq: Every day | ORAL | 1 refills | Status: DC

## 2020-02-01 MED ORDER — AMLODIPINE BESYLATE 10 MG PO TABS: 10.0000 mg | ORAL_TABLET | Freq: Every day | ORAL | 1 refills | Status: DC

## 2020-02-01 MED ORDER — INSULIN GLARGINE 100 UNIT/ML ~~LOC~~ SOLN: 48.0000 [IU] | Freq: Every morning | SUBCUTANEOUS | 5 refills | Status: DC

## 2020-02-01 MED ORDER — CITALOPRAM HYDROBROMIDE 20 MG PO TABS: 20.0000 mg | ORAL_TABLET | Freq: Every day | ORAL | 1 refills | Status: DC

## 2020-02-01 MED ORDER — FUROSEMIDE 20 MG PO TABS: 20.0000 mg | ORAL_TABLET | Freq: Every day | ORAL | 1 refills | Status: DC

## 2020-02-01 MED ORDER — ASPIRIN-DIPYRIDAMOLE ER 25-200 MG PO CP12: ORAL_CAPSULE | ORAL | 1 refills | Status: DC

## 2020-02-01 MED ORDER — LORAZEPAM 0.5 MG PO TABS: ORAL_TABLET | ORAL | 5 refills | Status: DC

## 2020-02-01 MED ORDER — POTASSIUM CHLORIDE ER 10 MEQ PO TBCR: 10.0000 meq | EXTENDED_RELEASE_TABLET | Freq: Every day | ORAL | 1 refills | Status: DC

## 2020-02-01 NOTE — Patient Instructions (Signed)

## 2020-02-01 NOTE — Progress Notes (Signed)
Subjective:    Patient ID: Kara Dean, female    DOB: [birth year], 86 y.o.   MRN: [id number]   Chief Complaint: No chief complaint on file.    HPI:  1. Essential hypertension Checks BP at home and is usually good. Denies chest pain, headaches. Does get SOB if overexerted. Is watching sodium intake. BP Readings from Last 3 Encounters:  11/23/19 (!) 133/51  11/19/19 (!) 152/73  11/03/19 (!) 142/82    2. Hyperlipidemia, unspecified hyperlipidemia type Has been trying to watch fat and cholesterol intake but does eat fried foods sometimes. Does not exercise. Lab Results  Component Value Date   CHOL 111 11/03/2019   HDL 45 11/03/2019   LDLCALC 38 11/03/2019   TRIG 167 (H) 11/03/2019   CHOLHDL 2.5 11/03/2019     3. Type 2 diabetes mellitus treated with insulin ([facility]) Checks BS at home and is usually in the 200s. Has numbness and tingling in both feet sometimes. Last eye exam was in May 2020. Does get low blood sugars sometimes if she doesn't eat for a long time but she eats a snack or takes a sugar pill and it is better. Lab Results  Component Value Date   HGBA1C 8.0 (H) 11/03/2019     4. Gastroesophageal reflux disease without esophagitis No problems with reflux at this time.  5. Chronic kidney disease (CKD) stage G3a/A3, moderately decreased glomerular filtration rate (GFR) between 45-59 mL/min/1.73 square meter and albuminuria creatinine ratio greater than 300 mg/g No problems with voiding.  6. Recurrent major depressive disorder, in full remission ([facility]) Sometimes feels depressed. Takes celexa daily.  7. Late effect of cerebrovascular accident (CVA) No long-term deficits.  8. GAD (generalized anxiety disorder) Takes ativan when she needs it and it does help with anxiety. Takes at night and it helps her sleep. GAD 7 : Generalized Anxiety Score 02/01/2020  Nervous, Anxious, on Edge 1  Control/stop worrying 1  Worry too much - different things 1  Trouble relaxing  0  Restless 0  Easily annoyed or irritable 1  Afraid - awful might happen 1  Total GAD 7 Score 5  Anxiety Difficulty Not difficult at all     9. Peripheral edema Does have some swelling in her legs sometimes.    Outpatient Encounter Medications as of 02/01/2020  Medication Sig  . amLODipine (NORVASC) 10 MG tablet Take 1 tablet (10 mg total) by mouth daily.  Marland Kitchen atorvastatin (LIPITOR) 10 MG tablet Take 1 tablet (10 mg total) by mouth daily.  . B Complex Vitamins (B COMPLEX-B12) TABS Take 1 tablet by mouth daily.    . cephALEXin (KEFLEX) 500 MG capsule Take 1 capsule (500 mg total) by mouth 4 (four) times daily.  . Cholecalciferol (VITAMIN D3 PO) Take 1 capsule daily by mouth.  . citalopram (CELEXA) 20 MG tablet Take 1 tablet (20 mg total) by mouth daily.  Marland Kitchen dipyridamole-aspirin (AGGRENOX) 200-25 MG 12hr capsule TAKE  (1)  CAPSULE  TWICE DAILY.  . fluticasone (FLONASE) 50 MCG/ACT nasal spray USE 2 SPRAYS IN EACH NOSTRIL ONCE DAILY.  . furosemide (LASIX) 20 MG tablet Take 1 tablet (20 mg total) by mouth daily.  . insulin glargine (LANTUS) 100 UNIT/ML injection Inject 0.45 mLs (45 Units total) into the skin every morning.  Marland Kitchen LORazepam (ATIVAN) 0.5 MG tablet TAKE (1) TABLET TWICE A DAY.  . Magnesium 250 MG TABS Take 250 mg daily by mouth.   . metoprolol succinate (TOPROL-XL) 100 MG 24 hr tablet Take  with or immediately following a meal.  . olmesartan (BENICAR) 40 MG tablet Take 1 tablet (40 mg total) by mouth daily.  . Omega-3 Fatty Acids (FISH OIL) 1000 MG CAPS Take 1 capsule by mouth daily.  Glory Rosebush ULTRA test strip CHECK BLOOD SUGER UP TO 3 TIMES A DAY  . pantoprazole (PROTONIX) 40 MG tablet Take 1 tablet (40 mg total) by mouth daily.  . potassium chloride (KLOR-CON) 10 MEQ tablet Take 1 tablet (10 mEq total) by mouth daily.   No facility-administered encounter medications on file as of 02/01/2020.    Past Surgical History:  Procedure Laterality Date  . ABDOMINAL HYSTERECTOMY     . BACK SURGERY  1994  . CATARACT EXTRACTION W/PHACO Left 09/19/2017   Procedure: CATARACT EXTRACTION PHACO AND INTRAOCULAR LENS PLACEMENT (IOC);  Surgeon: Baruch Goldmann, MD;  Location: [facility];  Service: Ophthalmology;  Laterality: Left;  CDE: 8.10  . CATARACT EXTRACTION W/PHACO Right 10/10/2017   Procedure: CATARACT EXTRACTION PHACO AND INTRAOCULAR LENS PLACEMENT RIGHT EYE;  Surgeon: Baruch Goldmann, MD;  Location: [facility];  Service: Ophthalmology;  Laterality: Right;  CDE: 9.60  . COLON RESECTION  1990's  . TUBAL LIGATION    . VESICOVAGINAL FISTULA CLOSURE W/ TAH  1984    Family History  Problem Relation Age of Onset  . Coronary artery disease Sister        stent x 3  . Seizures Sister   . Coronary artery disease Brother        stent  . Hypertension Mother   . Stroke Mother   . Emphysema Father   . Alcohol abuse Brother   . Lung disease Brother   . Heart attack Daughter     New complaints: None  Social history: Lives with granddaughter. Daughter lives across the street.  Controlled substance contract: 07/13/19     Review of Systems  Constitutional: Negative.   HENT: Positive for tinnitus.   Eyes: Negative.   Respiratory: Positive for shortness of breath (with exertion).   Cardiovascular: Positive for leg swelling.  Gastrointestinal: Negative.   Endocrine: Negative.   Genitourinary: Negative.   Musculoskeletal: Negative.   Neurological: Negative.   Hematological: Negative.   Psychiatric/Behavioral: Negative.        Objective:   Physical Exam Vitals and nursing note reviewed.  Constitutional:      Appearance: Normal appearance.  HENT:     Head: Normocephalic.     Right Ear: Tympanic membrane normal.     Left Ear: Tympanic membrane normal.     Nose: Nose normal.     Mouth/Throat:     Mouth: Mucous membranes are moist.     Pharynx: Oropharynx is clear.  Cardiovascular:     Rate and Rhythm: Normal rate and regular rhythm.     Pulses: Normal pulses.      Heart sounds: Normal heart sounds.  Pulmonary:     Effort: Pulmonary effort is normal.     Breath sounds: Normal breath sounds.  Abdominal:     General: Bowel sounds are normal.     Palpations: Abdomen is soft.  Musculoskeletal:     Cervical back: Normal range of motion.     Right lower leg: Edema (1+) present.     Left lower leg: Edema (1+) present.     Right foot: Deformity (Hammer toes 2, 3, 4) present.     Left foot: Deformity (Hammer toes 2, 3, 4) present.  Skin:    General: Skin is warm and dry.  Capillary Refill: Capillary refill takes less than 2 seconds.  Neurological:     Mental Status: She is alert and oriented to person, place, and time.  Psychiatric:        Behavior: Behavior normal.    BP (!) 141/52   Pulse 64   Temp (!) 97.3 F (36.3 C) (Temporal)   Resp 20   Ht _0  (1.499 m)   Wt 125 lb (56.7 kg)   SpO2 96%   BMI 25.25 kg/m        Assessment & Plan:  IKRAM RIEBE comes in today with chief complaint of Medical Management of Chronic Issues   Diagnosis and orders addressed:  1. Essential hypertension Low sodium diet. - CBC with Differential/Platelet - CMP14+EGFR  2. Hyperlipidemia, unspecified hyperlipidemia type Low fat diet. Exercise as tolerated. - Lipid panel  3. Type 2 diabetes mellitus treated with insulin ([facility]) Count carbs in diet. Increase lantus to 48 u daily - Bayer DCA Hb A1c Waived  4. Gastroesophageal reflux disease without esophagitis Avoid spicy foods Do not eat 2 hours prior to bedtime  5. Chronic kidney disease (CKD) stage G3a/A3, moderately decreased glomerular filtration rate (GFR) between 45-59 mL/min/1.73 square meter and albuminuria creatinine ratio greater than 300 mg/g   6. Recurrent major depressive disorder, in full remission [facility]) Stress management  7. Late effect of cerebrovascular accident (CVA) Fall prevention  8. GAD (generalized anxiety disorder) Ativan PRN  9. Peripheral edema Elevate legs  when sitting   Labs pending Health Maintenance reviewed Diet and exercise encouraged  Follow up plan: 3 months   [name], FNP

## 2020-02-02 LAB — CMP14+EGFR
ALT: 13 IU/L (ref 0–32)
AST: 15 IU/L (ref 0–40)
Albumin/Globulin Ratio: 1.5 (ref 1.2–2.2)
Albumin: 4.2 g/dL (ref 3.6–4.6)
Alkaline Phosphatase: 74 IU/L (ref 39–117)
BUN/Creatinine Ratio: 13 (ref 12–28)
BUN: 16 mg/dL (ref 8–27)
Bilirubin Total: 0.5 mg/dL (ref 0.0–1.2)
CO2: 22 mmol/L (ref 20–29)
Calcium: 10.1 mg/dL (ref 8.7–10.3)
Chloride: 105 mmol/L (ref 96–106)
Creatinine, Ser: 1.28 mg/dL — ABNORMAL HIGH (ref 0.57–1.00)
GFR calc Af Amer: 44 mL/min/{1.73_m2} — ABNORMAL LOW (ref >59)
GFR calc non Af Amer: 38 mL/min/{1.73_m2} — ABNORMAL LOW (ref >59)
Globulin, Total: 2.8 g/dL (ref 1.5–4.5)
Glucose: 143 mg/dL — ABNORMAL HIGH (ref 65–99)
Potassium: 4.5 mmol/L (ref 3.5–5.2)
Sodium: 142 mmol/L (ref 134–144)
Total Protein: 7 g/dL (ref 6.0–8.5)

## 2020-02-02 LAB — CBC WITH DIFFERENTIAL/PLATELET
Basophils Absolute: 0 10*3/uL (ref 0.0–0.2)
Basos: 1 %
EOS (ABSOLUTE): 0.2 10*3/uL (ref 0.0–0.4)
Eos: 3 %
Hematocrit: 38.6 % (ref 34.0–46.6)
Hemoglobin: 12.3 g/dL (ref 11.1–15.9)
Immature Grans (Abs): 0 10*3/uL (ref 0.0–0.1)
Immature Granulocytes: 0 %
Lymphocytes Absolute: 1 10*3/uL (ref 0.7–3.1)
Lymphs: 12 %
MCH: 29.6 pg (ref 26.6–33.0)
MCHC: 31.9 g/dL (ref 31.5–35.7)
MCV: 93 fL (ref 79–97)
Monocytes Absolute: 0.7 10*3/uL (ref 0.1–0.9)
Monocytes: 9 %
Neutrophils Absolute: 6.1 10*3/uL (ref 1.4–7.0)
Neutrophils: 75 %
Platelets: 201 10*3/uL (ref 150–450)
RBC: 4.15 x10E6/uL (ref 3.77–5.28)
RDW: 13.3 % (ref 11.7–15.4)
WBC: 8.1 10*3/uL (ref 3.4–10.8)

## 2020-02-02 LAB — LIPID PANEL
Chol/HDL Ratio: 2.3 ratio (ref 0.0–4.4)
Cholesterol, Total: 107 mg/dL (ref 100–199)
HDL: 47 mg/dL (ref >39)
LDL Chol Calc (NIH): 39 mg/dL (ref 0–99)
Triglycerides: 120 mg/dL (ref 0–149)
VLDL Cholesterol Cal: 21 mg/dL (ref 5–40)

## 2020-02-08 ENCOUNTER — Telehealth: Payer: Self-pay | Admitting: Nurse Practitioner

## 2020-02-08 ENCOUNTER — Encounter: Payer: Self-pay | Admitting: Family

## 2020-02-08 ENCOUNTER — Telehealth (INDEPENDENT_AMBULATORY_CARE_PROVIDER_SITE_OTHER): Payer: Medicare Other | Admitting: Family

## 2020-02-08 DIAGNOSIS — E119 Type 2 diabetes mellitus without complications: Secondary | ICD-10-CM | POA: Diagnosis not present

## 2020-02-08 DIAGNOSIS — Z794 Long term (current) use of insulin: Secondary | ICD-10-CM | POA: Diagnosis not present

## 2020-02-08 DIAGNOSIS — T23242A Burn of second degree of multiple left fingers (nail), including thumb, initial encounter: Secondary | ICD-10-CM | POA: Diagnosis not present

## 2020-02-08 DIAGNOSIS — T23232A Burn of second degree of multiple left fingers (nail), not including thumb, initial encounter: Secondary | ICD-10-CM

## 2020-02-08 MED ORDER — SILVER SULFADIAZINE 1 % EX CREA: 1.0000 "application " | TOPICAL_CREAM | Freq: Every day | CUTANEOUS | 0 refills | Status: DC

## 2020-02-08 MED ORDER — CEPHALEXIN 500 MG PO CAPS: 500.0000 mg | ORAL_CAPSULE | Freq: Three times a day (TID) | ORAL | 0 refills | Status: DC

## 2020-02-08 MED ORDER — TRAMADOL HCL 50 MG PO TABS: 50.0000 mg | ORAL_TABLET | Freq: Three times a day (TID) | ORAL | 0 refills | Status: AC | PRN
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
## 2020-02-14 ENCOUNTER — Other Ambulatory Visit: Payer: Self-pay

## 2020-02-14 ENCOUNTER — Encounter: Payer: Self-pay | Admitting: Nurse Practitioner

## 2020-02-14 ENCOUNTER — Ambulatory Visit (INDEPENDENT_AMBULATORY_CARE_PROVIDER_SITE_OTHER): Payer: Medicare Other | Admitting: Nurse Practitioner

## 2020-02-14 VITALS — BP 148/57 | HR 55 | Temp 97.9°F | Resp 20 | Ht 59.0 in | Wt 124.0 lb

## 2020-02-14 DIAGNOSIS — T23222D Burn of second degree of single left finger (nail) except thumb, subsequent encounter: Secondary | ICD-10-CM
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
## 2020-02-14 NOTE — Patient Instructions (Signed)
Burn Care, Adult A burn is an injury to the skin or the tissues under the skin. There are three types of burns:  First degree. These burns may cause the skin to be red and a bit swollen.  Second degree. These burns are very painful and cause the skin to be very red. The skin may also leak fluid, look shiny, and start to have blisters.  Third degree. These burns cause permanent damage. They turn the skin white or black and make it look charred, dry, and leathery. Taking care of your burn properly can help to prevent pain and infection. It can also help the burn to heal more quickly. How is this treated? Right after a burn:  Rinse or soak the burn under cool water. Do this for several minutes. Do not put ice on your burn. That can cause more damage.  Lightly cover the burn with a clean (sterile) cloth (dressing). Burn care  Raise (elevate) the injured area above the level of your heart while sitting or lying down.  Follow instructions from your doctor about: ? How to clean and take care of the burn. ? When to change and remove the cloth.  Check your burn every day for signs of infection. Check for: ? More redness, swelling, or pain. ? Warmth. ? Pus or a bad smell. Medicine   Take over-the-counter and prescription medicines only as told by your doctor.  If you were prescribed antibiotic medicine, take or apply it as told by your doctor. Do not stop using the antibiotic even if your condition improves. General instructions  To prevent infection: ? Do not put butter, oil, or other home treatments on the burn. ? Do not scratch or pick at the burn. ? Do not break any blisters. ? Do not peel skin.  Do not rub your burn, even when you are cleaning it.  Protect your burn from the sun. Contact a doctor if:  Your condition does not get better.  Your condition gets worse.  You have a fever.  Your burn looks different or starts to have black or red spots on it.  Your burn  feels warm to the touch.  Your pain is not controlled with medicine. Get help right away if:  You have redness, swelling, or pain at the site of the burn.  You have fluid, blood, or pus coming from your burn.  You have red streaks near the burn.  You have very bad pain. This information is not intended to replace advice given to you by your health care provider. Make sure you discuss any questions you have with your health care provider. Document Revised: 02/10/2019 Document Reviewed: 04/09/2016 Elsevier Patient Education  [place].

## 2020-02-17 ENCOUNTER — Ambulatory Visit (INDEPENDENT_AMBULATORY_CARE_PROVIDER_SITE_OTHER): Payer: Medicare Other | Admitting: *Deleted

## 2020-02-17 DIAGNOSIS — Z0000 Encounter for general adult medical examination without abnormal findings: Secondary | ICD-10-CM | POA: Diagnosis not present

## 2020-02-17 NOTE — Progress Notes (Signed)
MEDICARE ANNUAL WELLNESS VISIT  02/17/2020  Telephone Visit Disclaimer This Medicare AWV was conducted by telephone due to national recommendations for restrictions regarding the COVID-19 Pandemic (e.g. social distancing).  I verified, using two identifiers, that I am speaking with Kara Dean or their authorized healthcare agent. I discussed the limitations, risks, security, and privacy concerns of performing an evaluation and management service by telephone and the potential availability of an in-person appointment in the future. The patient expressed understanding and agreed to proceed.   Subjective:  Kara Dean is a 86 y.o. female patient of [name], [place] who had a Medicare Annual Wellness Visit today via telephone. Kara Dean is retired. She is widowed and her granddaughter lives with her. She has 6 children. One of her daughters lives across the hall in the same apartment complex.  She reports that she is socially active and does interact with friends/family regularly. She is minimally physically active and enjoys gardening and reading her bible. She has a International aid/development worker that she cares for as well.   Patient Care Team: [name], FNP as PCP - General (Nurse Practitioner) Sherren Mocha, MD as PCP - Cardiology (Cardiology) Baruch Goldmann, MD as Consulting Physician (Ophthalmology) Melina Schools, OD (Optometry) Sheffield, Ronalee Red, PA-C as Physician Assistant (Dermatology) Ilean China, RN as Registered Nurse  Advanced Directives 02/17/2020 01/10/2018 01/09/2018 10/10/2017 09/19/2017 09/16/2017 04/06/2016  Does Patient Have a Medical Advance Directive? No No No No No No No  Would patient like information on creating a medical advance directive? No - Patient declined Yes (Inpatient - patient requests chaplain consult to create a medical advance directive);Yes (Inpatient - patient defers creating a medical advance directive at this time) - No - Patient declined No - Patient  declined No - Patient declined No - patient declined information    Hospital Utilization Over the Past 12 Months: # of hospitalizations or ER visits: 0 # of surgeries: 0  Review of Systems    Patient reports that her overall health is unchanged compared to last year.  History obtained from the patient and patient chart.   Patient Reported Readings (BP, Pulse, CBG, Weight, etc) none  Pain Assessment Pain : No/denies pain     Current Medications & Allergies (verified) Allergies as of 02/17/2020      Reactions   Codeine Other (See Comments)   unknown   Latex Rash   Morphine Other (See Comments)   unknown   Niacin Other (See Comments)   unknown   Sulfa Antibiotics Rash   Sulfonamide Derivatives Rash      Medication List       Accurate as of February 17, 2020 10:26 AM. If you have any questions, ask your nurse or doctor.        amLODipine 10 MG tablet Commonly known as: NORVASC Take 1 tablet (10 mg total) by mouth daily.   atorvastatin 10 MG tablet Commonly known as: LIPITOR Take 1 tablet (10 mg total) by mouth daily.   B Complex-B12 Tabs Take 1 tablet by mouth daily.   cephALEXin 500 MG capsule Commonly known as: KEFLEX Take 1 capsule (500 mg total) by mouth 3 (three) times daily.   citalopram 20 MG tablet Commonly known as: CELEXA Take 1 tablet (20 mg total) by mouth daily.   dipyridamole-aspirin 200-25 MG 12hr capsule Commonly known as: AGGRENOX TAKE  (1)  CAPSULE  TWICE DAILY.   Fish Oil 1000 MG Caps Take 1 capsule by mouth daily.  fluticasone 50 MCG/ACT nasal spray Commonly known as: FLONASE USE 2 SPRAYS IN EACH NOSTRIL ONCE DAILY.   furosemide 20 MG tablet Commonly known as: LASIX Take 1 tablet (20 mg total) by mouth daily.   insulin glargine 100 UNIT/ML injection Commonly known as: Lantus Inject 0.48 mLs (48 Units total) into the skin every morning.   LORazepam 0.5 MG tablet Commonly known as: ATIVAN TAKE (1) TABLET TWICE A DAY.     Magnesium 250 MG Tabs Take 250 mg daily by mouth.   metoprolol succinate 100 MG 24 hr tablet Commonly known as: TOPROL-XL Take with or immediately following a meal.   olmesartan 40 MG tablet Commonly known as: BENICAR Take 1 tablet (40 mg total) by mouth daily.   OneTouch Ultra test strip Generic drug: glucose blood CHECK BLOOD SUGER UP TO 3 TIMES A DAY   pantoprazole 40 MG tablet Commonly known as: PROTONIX Take 1 tablet (40 mg total) by mouth daily.   potassium chloride 10 MEQ tablet Commonly known as: KLOR-CON Take 1 tablet (10 mEq total) by mouth daily.   silver sulfADIAZINE 1 % cream Commonly known as: SILVADENE Apply 1 application topically daily.   VITAMIN D3 PO Take 1 capsule daily by mouth.       History (reviewed): Past Medical History:  Diagnosis Date  . Anxiety   . Carotid artery disease ([place])    Carotid US 11/2018:  bilateral ICA 1-39 >> repeat 2 years.   . Cataract   . Crohn's colitis ([place])   . CVA (cerebral infarction)    No deficits  . Depression   . Diabetes mellitus    type 2  . Dyslipidemia   . GERD (gastroesophageal reflux disease)   . History of TIAs    no deficits  . Hyperlipidemia   . Hypertension   . Hyponatremia   . Nodular basal cell carcinoma (BCC) 04/22/2018   left prox, nasal root (tx p bx)  . Osteoarthritis   . SCC (squamous cell carcinoma) 04/22/2018   left shin,superior-(tx p bx), Left shin,inferior-(tx p bx )   Past Surgical History:  Procedure Laterality Date  . ABDOMINAL HYSTERECTOMY    . BACK SURGERY  1994  . CATARACT EXTRACTION W/PHACO Left 09/19/2017   Procedure: CATARACT EXTRACTION PHACO AND INTRAOCULAR LENS PLACEMENT (IOC);  Surgeon: Baruch Goldmann, MD;  Location: [facility];  Service: Ophthalmology;  Laterality: Left;  CDE: 8.10  . CATARACT EXTRACTION W/PHACO Right 10/10/2017   Procedure: CATARACT EXTRACTION PHACO AND INTRAOCULAR LENS PLACEMENT RIGHT EYE;  Surgeon: Baruch Goldmann, MD;  Location: [facility];  Service:  Ophthalmology;  Laterality: Right;  CDE: 9.60  . COLON RESECTION  1990's  . TUBAL LIGATION    . VESICOVAGINAL FISTULA CLOSURE W/ TAH  1984   Family History  Problem Relation Age of Onset  . Coronary artery disease Sister        stent x 3  . Seizures Sister   . Coronary artery disease Brother        stent  . Hypertension Mother   . Stroke Mother   . Emphysema Father   . Alcohol abuse Brother   . Lung disease Brother   . Heart attack Daughter    Social History   Socioeconomic History  . Marital status: Widowed    Spouse name: Not on file  . Number of children: 6  . Years of education: 18  . Highest education level: 10th grade  Occupational History  . Occupation: Retired  Comment: [employer]  Tobacco Use  . Smoking status: Never Smoker  . Smokeless tobacco: Never Used  Substance and Sexual Activity  . Alcohol use: No  . Drug use: No  . Sexual activity: Not Currently  Other Topics Concern  . Not on file  Social History Narrative   Mrs Maiorana is retired and widowed. She lives at home and her granddaughter lives with her. She lives within walking distance to her 2 daughter's houses. Has an indoor cat. Her sister moved to [place] and she doesn't get to get to visit with her often but does talk with her on the phone. She has 6 children and many grandchildren and great grandchildren.    Social Determinants of Health   Financial Resource Strain:   . Difficulty of Paying Living Expenses:   Food Insecurity:   . Worried About Charity fundraiser in the Last Year:   . Arboriculturist in the Last Year:   Transportation Needs:   . Film/video editor (Medical):   Marland Kitchen Lack of Transportation (Non-Medical):   Physical Activity:   . Days of Exercise per Week:   . Minutes of Exercise per Session:   Stress:   . Feeling of Stress :   Social Connections:   . Frequency of Communication with Friends and Family:   . Frequency of Social Gatherings with Friends and Family:   . Attends  Religious Services:   . Active Member of Clubs or Organizations:   . Attends Archivist Meetings:   Marland Kitchen Marital Status:     Activities of Daily Living In your present state of health, do you have any difficulty performing the following activities: 02/17/2020  Hearing? Y  Vision? Y  Comment cataract surgery, worse since  Difficulty concentrating or making decisions? Y  Comment remembering and making decisions  Walking or climbing stairs? Y  Comment walks slow, drags feet  Dressing or bathing? N  Doing errands, shopping? Y  Comment does not go anywhere alone  Preparing Food and eating ? Y  Comment daughter assists  Using the Toilet? N  In the past six months, have you accidently leaked urine? Y  Comment sometimes  Do you have problems with loss of bowel control? N  Managing your Medications? N  Managing your Finances? N  Housekeeping or managing your Housekeeping? N  Some recent data might be hidden    Patient Education/ Literacy How often do you need to have someone help you when you read instructions, pamphlets, or other written materials from your doctor or pharmacy?: 1 - Never What is the last grade level you completed in school?: 10th  Exercise Current Exercise Habits: The patient does not participate in regular exercise at present, Exercise limited by: Other - see comments(feet swell)  Diet Patient reports consuming 2 meals a day and 2 snack(s) a day Patient reports that her primary diet is: Regular Patient reports that she does have regular access to food.   Depression Screen PHQ 2/9 Scores 02/17/2020 02/14/2020 02/01/2020 11/23/2019 11/19/2019 11/03/2019 07/09/2019  PHQ - 2 Score 2 0 0 0 0 0 0  PHQ- 9 Score 11 - - - - - -     Fall Risk Fall Risk  02/17/2020 02/14/2020 02/01/2020 11/23/2019 11/19/2019  Falls in the past year? 0 0 0 0 0  Comment - - - - -  Number falls in past yr: - - - - -  Injury with Fall? - - - - -  Comment - - - - -  Risk Factor Category  -  - - - -  Risk for fall due to : - - - - -  Risk for fall due to: Comment - - - - -  Follow up - - - - -     Objective:  Kara Dean seemed alert and oriented and she participated appropriately during our telephone visit.  Blood Pressure Weight BMI  BP Readings from Last 3 Encounters:  02/14/20 (!) 148/57  02/01/20 (!) 141/52  11/23/19 (!) 133/51   Wt Readings from Last 3 Encounters:  02/14/20 124 lb (56.2 kg)  02/01/20 125 lb (56.7 kg)  11/23/19 125 lb (56.7 kg)   BMI Readings from Last 1 Encounters:  02/14/20 25.04 kg/m    *Unable to obtain current vital signs, weight, and BMI due to telephone visit type  Hearing/Vision  . Kara Dean did not seem to have difficulty with hearing/understanding during the telephone conversation . Reports that she has had a formal eye exam by an eye care professional within the past year . Reports that she has not had a formal hearing evaluation within the past year *Unable to fully assess hearing and vision during telephone visit type  Cognitive Function: 6CIT Screen 02/17/2020 09/21/2018  What Year? 0 points 0 points  What month? 0 points 0 points  What time? 0 points 0 points  Count back from 20 2 points 0 points  Months in reverse 0 points 0 points  Repeat phrase 2 points 0 points  Total Score 4 0   (Normal:0-7, Significant for Dysfunction: >8)  Normal Cognitive Function Screening: Yes   Immunization & Health Maintenance Record Immunization History  Administered Date(s) Administered  . Influenza Split 07/23/2013, 07/24/2015  . Influenza,inj,Quad PF,6+ Mos 08/01/2017, 08/15/2018  . Influenza,inj,quad, With Preservative 08/03/2019  . Influenza-Unspecified 10/21/35, 08/14/1937, 07/18/2014, 08/05/2016, 08/17/2018, 09/17/2018, 10/05/2019  . Pneumococcal Conjugate-13 07/18/2014  . Pneumococcal Polysaccharide-23 08/04/2008, 08/17/2018  . Pneumococcal-Unspecified 08/17/2018  . Td 06/11/2005  . Tdap 08/15/2014  . Zoster 08/07/2012  .  Zoster Recombinat (Shingrix) 10/05/2019    Health Maintenance  Topic Date Due  . OPHTHALMOLOGY EXAM  01/13/2020  . INFLUENZA VACCINE  06/04/2020  . HEMOGLOBIN A1C  08/03/2020  . FOOT EXAM  01/31/2021  . TETANUS/TDAP  08/15/2024  . DEXA SCAN  Completed  . PNA vac Low Risk Adult  Completed       Assessment  This is a routine wellness examination for Kara Dean.  Health Maintenance: Due or Overdue Health Maintenance Due  Topic Date Due  . OPHTHALMOLOGY EXAM  01/13/2020    Kara Dean does not need a referral for Community Assistance: Care Management:   no Social Work:    no Prescription Assistance:  no Nutrition/Diabetes Education:  no   Plan:  Personalized Goals Goals Addressed            This Visit's Progress   . Patient Stated       02/17/2020 AWV Goal: Exercise for General Health   Patient will verbalize understanding of the benefits of increased physical activity:  Exercising regularly is important. It will improve your overall fitness, flexibility, and endurance.  Regular exercise also will improve your overall health. It can help you control your weight, reduce stress, and improve your bone density.  Over the next year, patient will increase physical activity as tolerated with a goal of at least 150 minutes of moderate physical activity per week.   You can  tell that you are exercising at a moderate intensity if your heart starts beating faster and you start breathing faster but can still hold a conversation.  Moderate-intensity exercise ideas include:  Walking 1 mile (1.6 km) in about 15 minutes  Biking  Hiking  Golfing  Dancing  Water aerobics  Patient will verbalize understanding of everyday activities that increase physical activity by providing examples like the following: ? Yard work, such as: ? Pushing a Conservation officer, nature ? Raking and bagging leaves ? Washing your car ? Pushing a stroller ? Shoveling snow ? Gardening ? Washing  windows or floors  Patient will be able to explain general safety guidelines for exercising:   Before you start a new exercise program, talk with your health care provider.  Do not exercise so much that you hurt yourself, feel dizzy, or get very short of breath.  Wear comfortable clothes and wear shoes with good support.  Drink plenty of water while you exercise to prevent dehydration or heat stroke.  Work out until your breathing and your heartbeat get faster.       Personalized Health Maintenance & Screening Recommendations    Lung Cancer Screening Recommended: no (Low Dose CT Chest recommended if Age 23-80 years, 30 pack-year currently smoking OR have quit w/in past 15 years) Hepatitis C Screening recommended: no HIV Screening recommended: no  Advanced Directives: Written information was not prepared per patient's request.  Referrals & Orders No orders of the defined types were placed in this encounter.   Follow-up Plan . Follow-up with [name], FNP as planned  I have personally reviewed and noted the following in the patient's chart:   . Medical and social history . Use of alcohol, tobacco or illicit drugs  . Current medications and supplements . Functional ability and status . Nutritional status . Physical activity . Advanced directives . List of other physicians . Hospitalizations, surgeries, and ER visits in previous 12 months . Vitals . Screenings to include cognitive, depression, and falls . Referrals and appointments  In addition, I have reviewed and discussed with Kara Dean certain preventive protocols, quality metrics, and best practice recommendations. A written personalized care plan for preventive services as well as general preventive health recommendations is available and can be mailed to the patient at her request.     Baldomero Lamy, LPN  5/40/0867

## 2020-02-18 ENCOUNTER — Ambulatory Visit (INDEPENDENT_AMBULATORY_CARE_PROVIDER_SITE_OTHER): Payer: Medicare Other | Admitting: *Deleted

## 2020-02-18 DIAGNOSIS — E119 Type 2 diabetes mellitus without complications: Secondary | ICD-10-CM

## 2020-02-18 DIAGNOSIS — F411 Generalized anxiety disorder: Secondary | ICD-10-CM

## 2020-02-18 DIAGNOSIS — I10 Essential (primary) hypertension: Secondary | ICD-10-CM

## 2020-02-18 DIAGNOSIS — F3342 Major depressive disorder, recurrent, in full remission: Secondary | ICD-10-CM

## 2020-02-21 NOTE — Patient Instructions (Signed)
Visit Information  Goals Addressed            This Visit's Progress   . Chronic Disease Management Needs       CARE PLAN ENTRY (see longtitudinal plan of care for additional care plan information)  Current Barriers:  . Chronic Disease Management support, education, and care coordination needs related to HTN, GERD, Crohn's, DM, CVA, osteoarthritis, CKD, GAD, Depression, HLD  Clinical Goal(s) related to HTN, GERD, Crohn's, DM, CVA, osteoarthritis, CKD, GAD, Depression, HLD:  Over the next 45 days, patient will:  . Work with the care management team to address educational, disease management, and care coordination needs  . Begin or continue self health monitoring activities as directed today Measure and record cbg (blood glucose) 2 times daily and Measure and record blood pressure 3 times per week . Call provider office for new or worsened signs and symptoms Blood glucose findings outside established parameters and Blood pressure findings outside established parameters . Call care management team with questions or concerns . Verbalize basic understanding of patient centered plan of care established today  Interventions related to HTN, GERD, Crohn's, DM, CVA, osteoarthritis, CKD, GAD, Depression, HLD:  . Evaluation of current treatment plans and patient's adherence to plan as established by provider . Assessed patient understanding of disease states . Assessed patient's education and care coordination needs . Provided disease specific education to patient  . Collaborated with appropriate clinical care team members regarding patient needs . Recommended patient talk with LCSW regarding stress management . Provided with RN Care Manager telephone number and encouraged to reach out as needed  Patient Self Care Activities related to HTN, GERD, Crohn's, DM, CVA, osteoarthritis, CKD, GAD, Depression, HLD:  . Patient is unable to independently self-manage chronic health conditions  Initial goal  documentation         Kara Dean was given information about Chronic Care Management services today including:  1. CCM service includes personalized support from designated clinical staff supervised by her physician, including individualized plan of care and coordination with other care providers 2. 24/7 contact phone numbers for assistance for urgent and routine care needs. 3. Service will only be billed when office clinical staff spend 20 minutes or more in a month to coordinate care. 4. Only one practitioner may furnish and bill the service in a calendar month. 5. The patient may stop CCM services at any time (effective at the end of the month) by phone call to the office staff. 6. The patient will be responsible for cost sharing (co-pay) of up to 20% of the service fee (after annual deductible is met).  Patient agreed to services and verbal consent obtained.   The patient verbalized understanding of instructions provided today and declined a print copy of patient instruction materials.   The care management team will reach out to the patient again over the next 30 days.   [name], BSN, RN-BC Embedded Chronic Care Manager [facility] Direct Dial: [phone]

## 2020-02-21 NOTE — Chronic Care Management (AMB) (Signed)
Chronic Care Management   Initial Visit Note  02/21/2020 Name: Kara Dean MRN: [id number] DOB: [birth year]  Referred by: [name], FNP Reason for referral : Chronic Care Management (Initial Visit)   Kara Dean is a 86 y.o. year old female who is a primary care patient of [name], FNP. The CCM team was consulted for assistance with chronic disease management and care coordination needs related to HTN, GERD, Crohn's, DM, CVA, osteoarthritis, CKD, GAD, Depression, HLD.  Review of patient status, including review of consultants reports, relevant laboratory and other test results, and collaboration with appropriate care team members and the patient's provider was performed as part of comprehensive patient evaluation and provision of chronic care management services.    Subjective: I spoke with Ms Kopplin and her daughter by telephone today regarding management of her chronic medical conditions.   SDOH (Social Determinants of Health) assessments performed: Yes See Care Plan activities for detailed interventions related to SDOH  SDOH Interventions     Most Recent Value  SDOH Interventions  SDOH Interventions for the Following Domains  Stress, Depression, Transportation  Stress Interventions  Other (Comment) [referral to embedded CCM team LCSW]  Transportation Interventions  Other (Comment) [Daughter provides transportation but she would like other options as well. Discussed [facility].]  Depression Interventions/Treatment   Currently on Treatment, Counseling [referral to embedded CCM team LCSW]      Objective: Outpatient Encounter Medications as of 02/18/2020  Medication Sig  . amLODipine (NORVASC) 10 MG tablet Take 1 tablet (10 mg total) by mouth daily.  Marland Kitchen atorvastatin (LIPITOR) 10 MG tablet Take 1 tablet (10 mg total) by mouth daily.  . B Complex Vitamins (B COMPLEX-B12) TABS Take 1 tablet by mouth daily.    . cephALEXin (KEFLEX) 500 MG capsule Take 1 capsule  (500 mg total) by mouth 3 (three) times daily.  . Cholecalciferol (VITAMIN D3 PO) Take 1 capsule daily by mouth.  . citalopram (CELEXA) 20 MG tablet Take 1 tablet (20 mg total) by mouth daily.  Marland Kitchen dipyridamole-aspirin (AGGRENOX) 200-25 MG 12hr capsule TAKE  (1)  CAPSULE  TWICE DAILY.  . fluticasone (FLONASE) 50 MCG/ACT nasal spray USE 2 SPRAYS IN EACH NOSTRIL ONCE DAILY.  . furosemide (LASIX) 20 MG tablet Take 1 tablet (20 mg total) by mouth daily.  . insulin glargine (LANTUS) 100 UNIT/ML injection Inject 0.48 mLs (48 Units total) into the skin every morning.  Marland Kitchen LORazepam (ATIVAN) 0.5 MG tablet TAKE (1) TABLET TWICE A DAY.  . Magnesium 250 MG TABS Take 250 mg daily by mouth.   . metoprolol succinate (TOPROL-XL) 100 MG 24 hr tablet Take with or immediately following a meal.  . olmesartan (BENICAR) 40 MG tablet Take 1 tablet (40 mg total) by mouth daily.  . Omega-3 Fatty Acids (FISH OIL) 1000 MG CAPS Take 1 capsule by mouth daily.  Glory Rosebush ULTRA test strip CHECK BLOOD SUGER UP TO 3 TIMES A DAY  . pantoprazole (PROTONIX) 40 MG tablet Take 1 tablet (40 mg total) by mouth daily.  . potassium chloride (KLOR-CON) 10 MEQ tablet Take 1 tablet (10 mEq total) by mouth daily.  . silver sulfADIAZINE (SILVADENE) 1 % cream Apply 1 application topically daily.   No facility-administered encounter medications on file as of 02/18/2020.    Lab Results  Component Value Date   HGBA1C 7.8 (H) 02/01/2020   HGBA1C 8.0 (H) 11/03/2019   HGBA1C 7.3 (H) 07/09/2019   Lab Results  Component Value Date   MICROALBUR neg  09/20/2013   LDLCALC 39 02/01/2020   CREATININE 1.28 (H) 02/01/2020   BP Readings from Last 3 Encounters:  02/14/20 (!) 148/57  02/01/20 (!) 141/52  11/23/19 (!) 133/51      RN Care Plan           This Visit's Progress   . Chronic Disease Management Needs       CARE PLAN ENTRY (see longtitudinal plan of care for additional care plan information)  Current Barriers:  . Chronic Disease  Management support, education, and care coordination needs related to HTN, GERD, Crohn's, DM, CVA, osteoarthritis, CKD, GAD, Depression, HLD  Clinical Goal(s) related to HTN, GERD, Crohn's, DM, CVA, osteoarthritis, CKD, GAD, Depression, HLD:  Over the next 45 days, patient will:  . Work with the care management team to address educational, disease management, and care coordination needs  . Begin or continue self health monitoring activities as directed today Measure and record cbg (blood glucose) 2 times daily and Measure and record blood pressure 3 times per week . Call provider office for new or worsened signs and symptoms Blood glucose findings outside established parameters and Blood pressure findings outside established parameters . Call care management team with questions or concerns . Verbalize basic understanding of patient centered plan of care established today  Interventions related to HTN, GERD, Crohn's, DM, CVA, osteoarthritis, CKD, GAD, Depression, HLD:  . Evaluation of current treatment plans and patient's adherence to plan as established by provider . Assessed patient understanding of disease states . Assessed patient's education and care coordination needs . Provided disease specific education to patient  . Collaborated with appropriate clinical care team members regarding patient needs . Recommended patient talk with LCSW regarding stress management Provided with RN Care Manager telephone number and encouraged to reach out as needed  Patient Self Care Activities related to HTN, GERD, Crohn's, DM, CVA, osteoarthritis, CKD, GAD, Depression, HLD:  . Patient is unable to independently self-manage chronic health conditions  Initial goal documentation          Plan:   The care management team will reach out to the patient again over the next 30 days.   Provider Signature

## 2020-03-03 ENCOUNTER — Ambulatory Visit: Payer: Self-pay | Admitting: Licensed Clinical Social Worker

## 2020-03-03 DIAGNOSIS — I10 Essential (primary) hypertension: Secondary | ICD-10-CM

## 2020-03-03 DIAGNOSIS — Z794 Long term (current) use of insulin: Secondary | ICD-10-CM

## 2020-03-03 DIAGNOSIS — E785 Hyperlipidemia, unspecified: Secondary | ICD-10-CM

## 2020-03-03 DIAGNOSIS — F3342 Major depressive disorder, recurrent, in full remission: Secondary | ICD-10-CM

## 2020-03-03 DIAGNOSIS — N1831 Chronic kidney disease, stage 3a: Secondary | ICD-10-CM | POA: Diagnosis not present

## 2020-03-03 DIAGNOSIS — K219 Gastro-esophageal reflux disease without esophagitis: Secondary | ICD-10-CM

## 2020-03-03 DIAGNOSIS — E119 Type 2 diabetes mellitus without complications: Secondary | ICD-10-CM

## 2020-03-03 DIAGNOSIS — F411 Generalized anxiety disorder: Secondary | ICD-10-CM

## 2020-03-03 NOTE — Chronic Care Management (AMB) (Signed)
Chronic Care Management    Clinical Social Work Follow Up Note  03/03/2020 Name: Kara Dean MRN: [id number] DOB: [birth year]  Kara Dean is a 86 y.o. year old female who is a primary care patient of Kara Dean, Kara Dean. The CCM team was consulted for assistance with [employer] .   Review of patient status, including review of consultants reports, other relevant assessments, and collaboration with appropriate care team members and the patient's provider was performed as part of comprehensive patient evaluation and provision of chronic care management services.    SDOH (Social Determinants of Health) assessments performed: Yes; risk for stress; risk for depression  SDOH Interventions     Most Recent Value  SDOH Interventions  Depression Interventions/Treatment   Medication        Chronic Care Management from 03/03/2020 in [place]  PHQ-9 Total Score  6     GAD 7 : Generalized Anxiety Score 03/03/2020 02/01/2020  Nervous, Anxious, on Edge 1 1  Control/stop worrying 1 1  Worry too much - different things 1 1  Trouble relaxing 1 0  Restless 0 0  Easily annoyed or irritable 0 1  Afraid - awful might happen 0 1  Total GAD 7 Score 4 5  Anxiety Difficulty Somewhat difficult Not difficult at all   Outpatient Encounter Medications as of 03/03/2020  Medication Sig  . amLODipine (NORVASC) 10 MG tablet Take 1 tablet (10 mg total) by mouth daily.  Marland Kitchen atorvastatin (LIPITOR) 10 MG tablet Take 1 tablet (10 mg total) by mouth daily.  . B Complex Vitamins (B COMPLEX-B12) TABS Take 1 tablet by mouth daily.    . cephALEXin (KEFLEX) 500 MG capsule Take 1 capsule (500 mg total) by mouth 3 (three) times daily.  . Cholecalciferol (VITAMIN D3 PO) Take 1 capsule daily by mouth.  . citalopram (CELEXA) 20 MG tablet Take 1 tablet (20 mg total) by mouth daily.  Marland Kitchen dipyridamole-aspirin (AGGRENOX) 200-25 MG 12hr capsule TAKE  (1)  CAPSULE  TWICE DAILY.  .  fluticasone (FLONASE) 50 MCG/ACT nasal spray USE 2 SPRAYS IN EACH NOSTRIL ONCE DAILY.  . furosemide (LASIX) 20 MG tablet Take 1 tablet (20 mg total) by mouth daily.  . insulin glargine (LANTUS) 100 UNIT/ML injection Inject 0.48 mLs (48 Units total) into the skin every morning.  Marland Kitchen LORazepam (ATIVAN) 0.5 MG tablet TAKE (1) TABLET TWICE A DAY.  . Magnesium 250 MG TABS Take 250 mg daily by mouth.   . metoprolol succinate (TOPROL-XL) 100 MG 24 hr tablet Take with or immediately following a meal.  . olmesartan (BENICAR) 40 MG tablet Take 1 tablet (40 mg total) by mouth daily.  . Omega-3 Fatty Acids (FISH OIL) 1000 MG CAPS Take 1 capsule by mouth daily.  Glory Rosebush ULTRA test strip CHECK BLOOD SUGER UP TO 3 TIMES A DAY  . pantoprazole (PROTONIX) 40 MG tablet Take 1 tablet (40 mg total) by mouth daily.  . potassium chloride (KLOR-CON) 10 MEQ tablet Take 1 tablet (10 mEq total) by mouth daily.  . silver sulfADIAZINE (SILVADENE) 1 % cream Apply 1 application topically daily.   No facility-administered encounter medications on file as of 03/03/2020.     Goals Addressed            This Visit's Progress   . Client will talk with LCSW in next 30 days to talk about anxiety or depression issues of client (pt-stated)       [place] (see longitudinal  plan of care for additional care plan information)  Current Barriers:  Marland Kitchen Mobility issues . Anxiety issues in client with Chronic Diagnoses of CKD, GAD, GERD, Type 2 DM, HTN, HLD, Depression  Clinical Social Work Clinical Goal(s):  Marland Kitchen LCSW to call client/daugher in next 30 days to tlak about anxiety or depression issues of client  Interventions: . LCSW talked with client about relaxation techniques of client (likes to visit with children, likes to ride in car, watches TV, likes to listen to religous service,likes to talk via phone with relatives) . Talked with client about CCM program services . Talked with client about decreased appetite of  client . Talked with client about sleeping challenges . Talked with client about pain issues of client . Talked with client about mobility challenges  . Talked with client about anxiety issues of client (family stress issues) . Talked about social support network of client(daughter is supportive) . Talked with client and her daughter about client food stamps benefit . Talked with client about medication procurement . Talked with client about ADLs completion of client  Patient Self Care Activities:   Completes ADLs Takes medications as prescribed Attends medical appointment  Patient Self Care Deficits:  Marland Kitchen Mobility issues . Anxiety issues  Initial goal documentation      Follow Up Plan: LCSW to call client or her daughter in the next 4 weeks to talk with client/daughter about anxiety or depression issues of client  Norva Riffle.Emmylou Bieker MSW, LCSW Licensed Clinical Social Worker [facility] Care Management [phone]

## 2020-03-03 NOTE — Patient Instructions (Addendum)
Licensed Clinical Social Worker Visit Information  Goals we discussed today:  Goals Addressed            This Visit's Progress   . Client will talk with LCSW in next 30 days to talk about anxiety or depression issues of client (pt-stated)       CARE PLAN ENTRY  Current Barriers:  Marland Kitchen Mobility issues . Anxiety issues of client with Chronic Diagnoses of CKD, GAD, GERD, Type 2 DM, HTN, HLD, Depression  Clinical Social Work Clinical Goal(s):  Marland Kitchen LCSW to call client/daugher in next 30 days to tlak about anxiety or depression issues of clinet  Interventions: . LCSW talked with client about relaxation techniques of client (likes to visit with children, likes to ride in car, watches TV, likes to listen to religous service,likes to talk via phone with relatives) . Talked with client about CCM program  . Talked with client about decreased appetite of client . Talked with client about sleeping challenges . Talked with client about pain issues of client . Talked with client about mobility challenges  . Talked with client about anxiety issues of client (family stress issues) . Talked about social support network (daughter is supportive) . Talked with client and her daughter about client food stamps benefit . Talked with client about medication procurement . Talked with client about ADLs completion of client  Patient Self Care Activities:  Completes ADLs Takes medications as prescribed Attends medical appointment  Patient Self Care Deficits:  Marland Kitchen Mobility issues . Anxiety issues  Initial goal documentation        Materials Provided: No  Follow Up Plan: LCSW to call client or her daughter in the next 4 weeks to talk with client/daughter about anxiety or depression issues of client  The patient/daughter verbalized understanding of instructions provided today and declined a print copy of patient instruction materials.   Norva Riffle.Idalis Hoelting MSW, LCSW Licensed Clinical Social  Worker [facility] Care Management [phone]

## 2020-03-27 ENCOUNTER — Encounter: Payer: Self-pay | Admitting: Cardiovascular Disease

## 2020-03-27 ENCOUNTER — Ambulatory Visit (INDEPENDENT_AMBULATORY_CARE_PROVIDER_SITE_OTHER): Payer: Medicare Other | Admitting: Cardiovascular Disease

## 2020-03-27 VITALS — BP 138/60 | HR 53 | Ht 59.0 in | Wt 125.2 lb

## 2020-03-27 DIAGNOSIS — I10 Essential (primary) hypertension: Secondary | ICD-10-CM | POA: Diagnosis not present

## 2020-03-27 DIAGNOSIS — R0602 Shortness of breath: Secondary | ICD-10-CM

## 2020-03-27 DIAGNOSIS — I6523 Occlusion and stenosis of bilateral carotid arteries: Secondary | ICD-10-CM

## 2020-03-27 DIAGNOSIS — E782 Mixed hyperlipidemia: Secondary | ICD-10-CM | POA: Diagnosis not present

## 2020-03-27 NOTE — Progress Notes (Signed)
Cardiology Office Note:    Date:  03/27/2020   ID:  Kara, Dean 1933-07-04, MRN [id number]  PCP:  [name], FNP  Cardiologist:  Sherren Mocha, MD  Electrophysiologist:  None   Referring MD: [name], *   Chief Complaint  Patient presents with  . Shortness of Breath    History of Present Illness:    Kara Dean is a 86 y.o. female with a hx of TIA, type 2 diabetes, hyperlipidemia, hypertension, and longstanding intermittent chest pain.  The patient's most recent carotid duplex scan in January 2020 showed bilateral carotid plaquing with less than 40% stenosis.  The patient is here with a family member today.  She has been concerned about forgetfulness.  She has been reportedly tested for dementia with near normal findings.  Her family member is concerned that the patient just has too much stress on her and she worries quite a bit.  She has intermittent chest pain unchanged in pattern now for many years.  She complains of shortness of breath and wheezing at times.  She denies orthopnea, PND, heart palpitations, lightheadedness, or syncope.  She has pain in her feet.  She has had some problems with her toes but has been told that she is too old to have an operation.  Past Medical History:  Diagnosis Date  . Anxiety   . Carotid artery disease ([place])    Carotid US 11/2018:  bilateral ICA 1-39 >> repeat 2 years.   . Cataract   . Crohn's colitis ([place])   . CVA (cerebral infarction)    No deficits  . Depression   . Diabetes mellitus    type 2  . Dyslipidemia   . GERD (gastroesophageal reflux disease)   . History of TIAs    no deficits  . Hyperlipidemia   . Hypertension   . Hyponatremia   . Nodular basal cell carcinoma (BCC) 04/22/2018   left prox, nasal root (tx p bx)  . Osteoarthritis   . SCC (squamous cell carcinoma) 04/22/2018   left shin,superior-(tx p bx), Left shin,inferior-(tx p bx )    Past Surgical History:  Procedure Laterality  Date  . ABDOMINAL HYSTERECTOMY    . BACK SURGERY  1994  . CATARACT EXTRACTION W/PHACO Left 09/19/2017   Procedure: CATARACT EXTRACTION PHACO AND INTRAOCULAR LENS PLACEMENT (IOC);  Surgeon: Baruch Goldmann, MD;  Location: [facility];  Service: Ophthalmology;  Laterality: Left;  CDE: 8.10  . CATARACT EXTRACTION W/PHACO Right 10/10/2017   Procedure: CATARACT EXTRACTION PHACO AND INTRAOCULAR LENS PLACEMENT RIGHT EYE;  Surgeon: Baruch Goldmann, MD;  Location: [facility];  Service: Ophthalmology;  Laterality: Right;  CDE: 9.60  . COLON RESECTION  1990's  . TUBAL LIGATION    . VESICOVAGINAL FISTULA CLOSURE W/ TAH  1984    Current Medications: Current Meds  Medication Sig  . amLODipine (NORVASC) 10 MG tablet Take 1 tablet (10 mg total) by mouth daily.  Marland Kitchen atorvastatin (LIPITOR) 10 MG tablet Take 1 tablet (10 mg total) by mouth daily.  . B Complex Vitamins (B COMPLEX-B12) TABS Take 1 tablet by mouth daily.    . Cholecalciferol (VITAMIN D3 PO) Take 1 capsule daily by mouth.  . citalopram (CELEXA) 20 MG tablet Take 1 tablet (20 mg total) by mouth daily.  Marland Kitchen dipyridamole-aspirin (AGGRENOX) 200-25 MG 12hr capsule TAKE  (1)  CAPSULE  TWICE DAILY.  . fluticasone (FLONASE) 50 MCG/ACT nasal spray USE 2 SPRAYS IN EACH NOSTRIL ONCE DAILY.  . furosemide (LASIX) 20  MG tablet Take 1 tablet (20 mg total) by mouth daily.  . insulin glargine (LANTUS) 100 UNIT/ML injection Inject 0.48 mLs (48 Units total) into the skin every morning.  Marland Kitchen LORazepam (ATIVAN) 0.5 MG tablet TAKE (1) TABLET TWICE A DAY.  . Magnesium 250 MG TABS Take 250 mg daily by mouth.   . metoprolol succinate (TOPROL-XL) 100 MG 24 hr tablet Take with or immediately following a meal.  . olmesartan (BENICAR) 40 MG tablet Take 1 tablet (40 mg total) by mouth daily.  . Omega-3 Fatty Acids (FISH OIL) 1000 MG CAPS Take 1 capsule by mouth daily.  Glory Rosebush ULTRA test strip CHECK BLOOD SUGER UP TO 3 TIMES A DAY  . pantoprazole (PROTONIX) 40 MG tablet Take 1 tablet  (40 mg total) by mouth daily.  . potassium chloride (KLOR-CON) 10 MEQ tablet Take 1 tablet (10 mEq total) by mouth daily.     Allergies:   Codeine, Latex, Morphine, Niacin, Sulfa antibiotics, and Sulfonamide derivatives   Social History   Socioeconomic History  . Marital status: Widowed    Spouse name: Not on file  . Number of children: 6  . Years of education: 73  . Highest education level: 10th grade  Occupational History  . Occupation: Retired    Comment: [employer]  Tobacco Use  . Smoking status: Never Smoker  . Smokeless tobacco: Never Used  Substance and Sexual Activity  . Alcohol use: No  . Drug use: No  . Sexual activity: Not Currently  Other Topics Concern  . Not on file  Social History Narrative   Kara Dean is retired and widowed. She lives at home and her granddaughter lives with her. She lives within walking distance to her 2 daughter's houses. Has an indoor cat. Her sister moved to [place] and she doesn't get to get to visit with her often but does talk with her on the phone. She has 6 children and many grandchildren and great grandchildren.    Social Determinants of Health   Financial Resource Strain: Low Risk   . Difficulty of Paying Living Expenses: Not very hard  Food Insecurity: No Food Insecurity  . Worried About Charity fundraiser in the Last Year: Never true  . Ran Out of Food in the Last Year: Never true  Transportation Needs: No Transportation Needs  . Lack of Transportation (Medical): No  . Lack of Transportation (Non-Medical): No  Physical Activity: Insufficiently Active  . Days of Exercise per Week: 2 days  . Minutes of Exercise per Session: 60 min  Stress: Stress Concern Present  . Feeling of Stress : Rather much  Social Connections: Moderately Isolated  . Frequency of Communication with Friends and Family: More than three times a week  . Frequency of Social Gatherings with Friends and Family: More than three times a week  . Attends Religious  Services: Never  . Active Member of Clubs or Organizations: No  . Attends Archivist Meetings: Never  . Marital Status: Widowed     Family History: The patient's family history includes Alcohol abuse in her brother; Coronary artery disease in her brother and sister; Emphysema in her father; Heart attack in her daughter; Hypertension in her mother; Lung disease in her brother; Seizures in her sister; Stroke in her mother.  ROS:   Please see the history of present illness.    All other systems reviewed and are negative.  EKGs/Labs/Other Studies Reviewed:    The following studies were reviewed today: Carotid  ultrasound 11/11/2018: Summary:  Right Carotid: Velocities in the right ICA are consistent with a 1-39%  stenosis.   Left Carotid: Velocities in the left ICA are consistent with a 1-39%  stenosis.        Non-hemodynamically significant plaque noted in the CCA.   Vertebrals: Bilateral vertebral arteries demonstrate antegrade flow.  Subclavians: Normal flow hemodynamics were seen in bilateral subclavian        arteries.   EKG:  EKG is ordered today.  The ekg ordered today demonstrates sinus bradycardia 53 bpm, left axis deviation, no significant change from previous tracing.  Recent Labs: 02/01/2020: ALT 13; BUN 16; Creatinine, Ser 1.28; Hemoglobin 12.3; Platelets 201; Potassium 4.5; Sodium 142  Recent Lipid Panel    Component Value Date/Time   CHOL 107 02/01/2020 1240   CHOL 124 02/26/2013 1043   TRIG 120 02/01/2020 1240   TRIG 132 03/09/2015 1036   TRIG 182 (H) 02/26/2013 1043   HDL 47 02/01/2020 1240   HDL 39 (L) 03/09/2015 1036   HDL 40 02/26/2013 1043   CHOLHDL 2.3 02/01/2020 1240   CHOLHDL 3.0 11/10/2010 0327   VLDL 25 11/10/2010 0327   LDLCALC 39 02/01/2020 1240   LDLCALC 42 04/27/2014 1455   LDLCALC 48 02/26/2013 1043    Physical Exam:    VS:  BP 138/60   Pulse (!) 53   Ht 4' 11"  (1.499 m)   Wt 125 lb 3.2 oz (56.8 kg)   BMI 25.29  kg/m     Wt Readings from Last 3 Encounters:  03/27/20 125 lb 3.2 oz (56.8 kg)  02/14/20 124 lb (56.2 kg)  02/01/20 125 lb (56.7 kg)     GEN:  Well nourished, well developed in no acute distress HEENT: Normal NECK: No JVD; No carotid bruits LYMPHATICS: No lymphadenopathy CARDIAC: RRR, 2/6 SEM at the RUSB RESPIRATORY:  Clear to auscultation without rales, wheezing or rhonchi  ABDOMEN: Soft, non-tender, non-distended MUSCULOSKELETAL:  No edema; No deformity  SKIN: Warm and dry NEUROLOGIC:  Alert and oriented x 3 PSYCHIATRIC:  Normal affect   ASSESSMENT:    1. Bilateral carotid artery stenosis   2. Essential hypertension   3. Mixed hyperlipidemia   4. Shortness of breath    PLAN:    In order of problems listed above:  1. Bilateral nonobstructive carotid stenosis noted on carotid duplex from January 2020.  The patient is on appropriate medical therapy which includes antiplatelet therapy, and ARB, and atorvastatin.  Continue clinical follow-up. 2. Blood pressure is controlled on amlodipine, metoprolol succinate, and telmisartan. 3. Lipids are excellent with a total cholesterol of 107, LDL cholesterol 39 mg/dL.  ALT is normal at 13 mg/dL 4. We will check an echocardiogram.  Her most recent studies are reviewed but it has been several years since she has had echo or stress testing studies done.  Her last echocardiogram was in 2012 and demonstrated normal LV function.  Her last Myoview scan was in 2014 and showed no ischemia.  I do not think she needs repeat stress testing, but I have recommended an echocardiogram to evaluate for any LV dysfunction or development of valvular heart problems in light of her shortness of breath and intermittent wheezing.   Medication Adjustments/Labs and Tests Ordered: Current medicines are reviewed at length with the patient today.  Concerns regarding medicines are outlined above.  Orders Placed This Encounter  Procedures  . EKG 12-Lead  .  ECHOCARDIOGRAM COMPLETE   No orders of the defined types were  placed in this encounter.   Patient Instructions  Medication Instructions:  Your provider recommends that you continue on your current medications as directed. Please refer to the Current Medication list given to you today.   *If you need a refill on your cardiac medications before your next appointment, please call your pharmacy*  Testing/Procedures: Your provider has requested that you have an echocardiogram. Echocardiography is a painless test that uses sound waves to create images of your heart. It provides your doctor with information about the size and shape of your heart and how well your heart's chambers and valves are working. This procedure takes approximately one hour. There are no restrictions for this procedure.      Follow-Up: At [facility], you and your health needs are our priority.  As part of our continuing mission to provide you with exceptional heart care, we have created designated Provider Care Teams.  These Care Teams include your primary Cardiologist (physician) and Advanced Practice Providers (APPs -  Physician Assistants and Nurse Practitioners) who all work together to provide you with the care you need, when you need it. Your next appointment:   12 month(s) The format for your next appointment:   In Person Provider:   You may see Sherren Mocha, MD or one of the following Advanced Practice Providers on your designated Care Team:    Richardson Dopp, PA-C  Robbie Lis, [place]      Signed, Sherren Mocha, MD  03/27/2020 11:03 AM    [place]

## 2020-03-27 NOTE — Patient Instructions (Signed)
Medication Instructions:  Your provider recommends that you continue on your current medications as directed. Please refer to the Current Medication list given to you today.   *If you need a refill on your cardiac medications before your next appointment, please call your pharmacy*  Testing/Procedures: Your provider has requested that you have an echocardiogram. Echocardiography is a painless test that uses sound waves to create images of your heart. It provides your doctor with information about the size and shape of your heart and how well your heart's chambers and valves are working. This procedure takes approximately one hour. There are no restrictions for this procedure.      Follow-Up: At [facility], you and your health needs are our priority.  As part of our continuing mission to provide you with exceptional heart care, we have created designated Provider Care Teams.  These Care Teams include your primary Cardiologist (physician) and Advanced Practice Providers (APPs -  Physician Assistants and Nurse Practitioners) who all work together to provide you with the care you need, when you need it. Your next appointment:   12 month(s) The format for your next appointment:   In Person Provider:   You may see Sherren Mocha, MD or one of the following Advanced Practice Providers on your designated Care Team:    Richardson Dopp, PA-C  [place]in [place], [place]

## 2020-03-28 ENCOUNTER — Ambulatory Visit (INDEPENDENT_AMBULATORY_CARE_PROVIDER_SITE_OTHER): Payer: Medicare Other | Admitting: Licensed Clinical Social Worker

## 2020-03-28 DIAGNOSIS — N1831 Chronic kidney disease, stage 3a: Secondary | ICD-10-CM | POA: Diagnosis not present

## 2020-03-28 DIAGNOSIS — E785 Hyperlipidemia, unspecified: Secondary | ICD-10-CM | POA: Diagnosis not present

## 2020-03-28 DIAGNOSIS — F411 Generalized anxiety disorder: Secondary | ICD-10-CM

## 2020-03-28 DIAGNOSIS — I10 Essential (primary) hypertension: Secondary | ICD-10-CM

## 2020-03-28 DIAGNOSIS — E119 Type 2 diabetes mellitus without complications: Secondary | ICD-10-CM | POA: Diagnosis not present

## 2020-03-28 DIAGNOSIS — K219 Gastro-esophageal reflux disease without esophagitis: Secondary | ICD-10-CM

## 2020-03-28 DIAGNOSIS — F3342 Major depressive disorder, recurrent, in full remission: Secondary | ICD-10-CM | POA: Diagnosis not present

## 2020-03-28 DIAGNOSIS — Z794 Long term (current) use of insulin: Secondary | ICD-10-CM

## 2020-03-28 DIAGNOSIS — I6930 Unspecified sequelae of cerebral infarction: Secondary | ICD-10-CM

## 2020-03-28 NOTE — Patient Instructions (Addendum)
Licensed Clinical Social Worker Visit Information  Goals we discussed today:  Goals Addressed            This Visit's Progress   . Client will talk with LCSW in next 30 days to talk about anxiety or depression issues of client (pt-stated)       CARE PLAN ENTRY   Current Barriers:  Marland Kitchen Mobility issues . Anxiety issues of client with chronic diagnoses of HLD, Depression HTN, Hx CVA, OA, Type 2 DM, GERD, GAD, CKD  Clinical Social Work Clinical Goal(s):  Marland Kitchen LCSW to call client/daugher in next 30 days to tlak about anxiety or depression issues of clinet  Interventions: . LCSW talked with client about relaxation techniques of client (likes to visit with children, likes to ride in car, watches TV, likes to listen to religous service,likes to talk via phone with relatives) . Talked with client about CCM program  . Talked with client about decreased appetite . Talked with client about sleeping challenges . Talked with client about pain issues of client . Talked with client about mobility challenges  . Talked with client about anxiety issues of client (family stress issues) . Talked about social support network (daughter is supportive) . Talked with client and her daughter about client food stamps benefit . Talked with client about medication procurement . Talked with clinet about ADLs completion of client Talked with client about her upcoming medical appointments  Talked with client about vision challenges  Encouraged client or daughter to call RNCM as needed to discuss nursing needs of client  Patient Self Care Activities:  Completes ADLs Takes medications as prescribed Attends medical appointment  Patient Self Care Deficits:  Marland Kitchen Mobility issues . Anxiety issues  Initial goal documentation      Materials Provided: No  Follow Up Plan: LCSW to call client or her daughter in the next 4 weeks to talk with client/daughter about anxiety or depression issues of client  The patient  verbalized understanding of instructions provided today and declined a print copy of patient instruction materials.   Norva Riffle.Lesley Atkin MSW, LCSW Licensed Clinical Social Worker [facility] [phone]

## 2020-03-28 NOTE — Chronic Care Management (AMB) (Signed)
Chronic Care Management    Clinical Social Work Follow Up Note  03/28/2020 Name: TAMAKA SAWIN MRN: [id number] DOB: 11-15-1932  Ellene Route Haren is a 86 y.o. year old female who is a primary care patient of [name], [place]. The CCM team was consulted for assistance with [employer] .   Review of patient status, including review of consultants reports, other relevant assessments, and collaboration with appropriate care team members and the patient's provider was performed as part of comprehensive patient evaluation and provision of chronic care management services.    SDOH (Social Determinants of Health) assessments performed: Yes;risk for social isolation, risk for depression; risk for stress; risk for physical inactivity    Chronic Care Management from 03/03/2020 in [place]  PHQ-9 Total Score  6     GAD 7 : Generalized Anxiety Score 03/03/2020 02/01/2020  Nervous, Anxious, on Edge 1 1  Control/stop worrying 1 1  Worry too much - different things 1 1  Trouble relaxing 1 0  Restless 0 0  Easily annoyed or irritable 0 1  Afraid - awful might happen 0 1  Total GAD 7 Score 4 5  Anxiety Difficulty Somewhat difficult Not difficult at all    Outpatient Encounter Medications as of 03/28/2020  Medication Sig  . amLODipine (NORVASC) 10 MG tablet Take 1 tablet (10 mg total) by mouth daily.  Marland Kitchen atorvastatin (LIPITOR) 10 MG tablet Take 1 tablet (10 mg total) by mouth daily.  . B Complex Vitamins (B COMPLEX-B12) TABS Take 1 tablet by mouth daily.    . Cholecalciferol (VITAMIN D3 PO) Take 1 capsule daily by mouth.  . citalopram (CELEXA) 20 MG tablet Take 1 tablet (20 mg total) by mouth daily.  Marland Kitchen dipyridamole-aspirin (AGGRENOX) 200-25 MG 12hr capsule TAKE  (1)  CAPSULE  TWICE DAILY.  . fluticasone (FLONASE) 50 MCG/ACT nasal spray USE 2 SPRAYS IN EACH NOSTRIL ONCE DAILY.  . furosemide (LASIX) 20 MG tablet Take 1 tablet (20 mg total) by mouth daily.  .  insulin glargine (LANTUS) 100 UNIT/ML injection Inject 0.48 mLs (48 Units total) into the skin every morning.  Marland Kitchen LORazepam (ATIVAN) 0.5 MG tablet TAKE (1) TABLET TWICE A DAY.  . Magnesium 250 MG TABS Take 250 mg daily by mouth.   . metoprolol succinate (TOPROL-XL) 100 MG 24 hr tablet Take with or immediately following a meal.  . olmesartan (BENICAR) 40 MG tablet Take 1 tablet (40 mg total) by mouth daily.  . Omega-3 Fatty Acids (FISH OIL) 1000 MG CAPS Take 1 capsule by mouth daily.  Glory Rosebush ULTRA test strip CHECK BLOOD SUGER UP TO 3 TIMES A DAY  . pantoprazole (PROTONIX) 40 MG tablet Take 1 tablet (40 mg total) by mouth daily.  . potassium chloride (KLOR-CON) 10 MEQ tablet Take 1 tablet (10 mEq total) by mouth daily.   No facility-administered encounter medications on file as of 03/28/2020.     Goals Addressed            This Visit's Progress   . Client will talk with LCSW in next 30 days to talk about anxiety or depression issues of client (pt-stated)       [place] (see longitudinal plan of care for additional care plan information)  Current Barriers:  Marland Kitchen Mobility issues . Anxiety issues of client with chronic diagnoses of HLD, Depression HTN, Hx CVA, OA, Type 2 DM, GERD, GAD, CKD  Clinical Social Work Clinical Goal(s):  Marland Kitchen LCSW to call  client/daugher in next 30 days to tlak about anxiety or depression issues of clinet  Interventions: . LCSW talked with client about relaxation techniques of client (likes to visit with children, likes to ride in car, watches TV, likes to listen to religous service,likes to talk via phone with relatives) . Talked with client about CCM program  . Talked with client about decreased appetite . Talked with client about sleeping challenges . Talked with client about pain issues of client . Talked with client about mobility challenges  . Talked with client about anxiety issues of client (family stress issues) . Talked about social support  network (daughter is supportive) . Talked with client and her daughter about client food stamps benefit . Talked with client about medication procurement . Talked with clinet about ADLs completion of client . Talked with client about her upcoming medical appointments . Talked with client about vision challenges . Encouraged client or daughter to call RNCM as needed to discuss nursing needs of client  Patient Self Care Activities:  Completes ADLs Takes medications as prescribed Attends medical appointment  Patient Self Care Deficits:  Marland Kitchen Mobility issues . Anxiety issues  Initial goal documentation      Follow Up Plan: LCSW to call client or her daughter in the next 4 weeks to talk with client/daughter about anxiety or depression issues of client  Norva Riffle.Aleksis Jiggetts MSW, LCSW Licensed Clinical Social Worker [facility] [phone]

## 2020-03-30 DIAGNOSIS — L84 Corns and callosities: Secondary | ICD-10-CM | POA: Diagnosis not present

## 2020-03-30 DIAGNOSIS — M79676 Pain in unspecified toe(s): Secondary | ICD-10-CM | POA: Diagnosis not present

## 2020-03-30 DIAGNOSIS — E1142 Type 2 diabetes mellitus with diabetic polyneuropathy: Secondary | ICD-10-CM | POA: Diagnosis not present

## 2020-03-30 DIAGNOSIS — B351 Tinea unguium: Secondary | ICD-10-CM | POA: Diagnosis not present

## 2020-04-14 ENCOUNTER — Ambulatory Visit (HOSPITAL_COMMUNITY): Payer: Medicare Other | Attending: Cardiology

## 2020-04-14 ENCOUNTER — Other Ambulatory Visit: Payer: Self-pay

## 2020-04-14 DIAGNOSIS — R0602 Shortness of breath: Secondary | ICD-10-CM | POA: Diagnosis not present

## 2020-05-02 ENCOUNTER — Ambulatory Visit (INDEPENDENT_AMBULATORY_CARE_PROVIDER_SITE_OTHER): Payer: Medicare Other | Admitting: Licensed Clinical Social Worker

## 2020-05-02 DIAGNOSIS — I10 Essential (primary) hypertension: Secondary | ICD-10-CM

## 2020-05-02 DIAGNOSIS — E785 Hyperlipidemia, unspecified: Secondary | ICD-10-CM | POA: Diagnosis not present

## 2020-05-02 DIAGNOSIS — Z794 Long term (current) use of insulin: Secondary | ICD-10-CM

## 2020-05-02 DIAGNOSIS — E119 Type 2 diabetes mellitus without complications: Secondary | ICD-10-CM

## 2020-05-02 DIAGNOSIS — N1831 Chronic kidney disease, stage 3a: Secondary | ICD-10-CM | POA: Diagnosis not present

## 2020-05-02 DIAGNOSIS — F3342 Major depressive disorder, recurrent, in full remission: Secondary | ICD-10-CM

## 2020-05-02 DIAGNOSIS — F411 Generalized anxiety disorder: Secondary | ICD-10-CM

## 2020-05-02 DIAGNOSIS — I6930 Unspecified sequelae of cerebral infarction: Secondary | ICD-10-CM

## 2020-05-02 DIAGNOSIS — K219 Gastro-esophageal reflux disease without esophagitis: Secondary | ICD-10-CM

## 2020-05-02 NOTE — Chronic Care Management (AMB) (Signed)
Chronic Care Management    Clinical Social Work Follow Up Note  05/02/2020 Name: Kara Dean MRN: [id number] DOB: April 07, 1933  Kara Dean is a 86 y.o. year old female who is a primary care patient of [name], [place]. The CCM team was consulted for assistance with [employer] .   Review of patient status, including review of consultants reports, other relevant assessments, and collaboration with appropriate care team members and the patient's provider was performed as part of comprehensive patient evaluation and provision of chronic care management services.    SDOH (Social Determinants of Health) assessments performed: No; risk for social isolation; risk for depression; risk for stress; risk for physical inactivity    Chronic Care Management from 03/03/2020 in [place]  PHQ-9 Total Score 6       GAD 7 : Generalized Anxiety Score 03/03/2020 02/01/2020  Nervous, Anxious, on Edge 1 1  Control/stop worrying 1 1  Worry too much - different things 1 1  Trouble relaxing 1 0  Restless 0 0  Easily annoyed or irritable 0 1  Afraid - awful might happen 0 1  Total GAD 7 Score 4 5  Anxiety Difficulty Somewhat difficult Not difficult at all    Outpatient Encounter Medications as of 05/02/2020  Medication Sig  . amLODipine (NORVASC) 10 MG tablet Take 1 tablet (10 mg total) by mouth daily.  Kara Dean atorvastatin (LIPITOR) 10 MG tablet Take 1 tablet (10 mg total) by mouth daily.  . B Complex Vitamins (B COMPLEX-B12) TABS Take 1 tablet by mouth daily.    . Cholecalciferol (VITAMIN D3 PO) Take 1 capsule daily by mouth.  . citalopram (CELEXA) 20 MG tablet Take 1 tablet (20 mg total) by mouth daily.  Kara Dean dipyridamole-aspirin (AGGRENOX) 200-25 MG 12hr capsule TAKE  (1)  CAPSULE  TWICE DAILY.  . fluticasone (FLONASE) 50 MCG/ACT nasal spray USE 2 SPRAYS IN EACH NOSTRIL ONCE DAILY.  . furosemide (LASIX) 20 MG tablet Take 1 tablet (20 mg total) by mouth daily.  .  insulin glargine (LANTUS) 100 UNIT/ML injection Inject 0.48 mLs (48 Units total) into the skin every morning.  Kara Dean LORazepam (ATIVAN) 0.5 MG tablet TAKE (1) TABLET TWICE A DAY.  . Magnesium 250 MG TABS Take 250 mg daily by mouth.   . metoprolol succinate (TOPROL-XL) 100 MG 24 hr tablet Take with or immediately following a meal.  . olmesartan (BENICAR) 40 MG tablet Take 1 tablet (40 mg total) by mouth daily.  . Omega-3 Fatty Acids (FISH OIL) 1000 MG CAPS Take 1 capsule by mouth daily.  Glory Rosebush ULTRA test strip CHECK BLOOD SUGER UP TO 3 TIMES A DAY  . pantoprazole (PROTONIX) 40 MG tablet Take 1 tablet (40 mg total) by mouth daily.  . potassium chloride (KLOR-CON) 10 MEQ tablet Take 1 tablet (10 mEq total) by mouth daily.   No facility-administered encounter medications on file as of 05/02/2020.    Goals    .  Client will talk with LCSW in next 30 days to talk about anxiety or depression issues of client (pt-stated)      CARE PLAN ENTRY   Current Barriers:  Kara Dean Mobility issues . Anxiety issues of client with chronic diagnoses of HLD, Depression HTN, Hx CVA, OA, Type 2 DM, GERD, GAD, CKD  Clinical Social Work Clinical Goal(s):  Kara Kitchen LCSW to call client/daugher in next 30 days to tlak about anxiety or depression issues of clinet  Interventions: . LCSW talked previously  with  client about relaxation techniques of client (likes to visit with children, likes to ride in car, watches TV, likes to listen to religous service,likes to talk via phone with relatives) . Talked with Kara Dean, daughter, about decreased appetite of client . Talked with Kara Dean about sleeping challenges of client . Talked with Kara Dean about pain issues of client . Talked with Kara Dean about mobility challenges of client (client has a walker to use as needed) . Talked with Kara Dean about anxiety issues of client (family stress issues) . Talked with Kara Dean about client food stamps benefit (client receives food stamps  benefit). . Talked with Kara Dean about ADLs completion of client . Talked with Kara Dean about vision challenges of client . Talked with Kara Dean about socialization of client  Patient Self Care Activities:  Completes ADLs Takes medications as prescribed Attends medical appointment  Patient Self Care Deficits:  Kara Dean Mobility issues . Anxiety issues  Initial goal documentation      Follow Up Plan: LCSW to call client or her daughter in the next 4 weeks to talk with client/daughter about anxiety or depression issues of client  Kara Dean MSW, LCSW Licensed Clinical Social Worker [facility] [phone]

## 2020-05-02 NOTE — Patient Instructions (Addendum)
Licensed Clinical Social Worker Visit Information  Goals we discussed today:  .   Client will talk with LCSW in next 30 days to talk about anxiety or depression issues of client (pt-stated)         CARE PLAN ENTRY   Current Barriers:   Mobility issues  Anxiety issues of client with chronic diagnoses of HLD, Depression HTN, Hx CVA, OA, Type 2 DM, GERD, GAD, CKD  Clinical Social Work Clinical Goal(s):   LCSW to call client/daugher in next 30 days to tlak about anxiety or depression issues of clinet  Interventions:  LCSW talked previously  with client about relaxation techniques of client (likes to visit with children, likes to ride in car, watches TV, likes to listen to religous service,likes to talk via phone with relatives)  Talked with Kara Dean, daughter, about decreased appetite of client  Talked with Kara Dean about sleeping challenges of client  Talked with Kara Dean about pain issues of client  Talked with Kara Dean about mobility challenges of client (client has a walker to use as needed)  Talked with Kara Dean about anxiety issues of client (family stress issues)  Talked with Kara Dean about client food stamps benefit (client receives food stamps benefit).  Talked with Kara Dean about ADLs completion of client  Talked with Kara Dean about vision challenges of client  Talked with Kara Dean about socialization of client  Patient Self Care Activities:  Completes ADLs Takes medications as prescribed Attends medical appointment  Patient Self Care Deficits:   Mobility issues  Anxiety issues  Initial goal documentation      Follow Up Plan: LCSW to call client or her daughter in the next 4 weeks to talk with client/daughter about anxiety or depression issues of client  Materials Provided: No  The patient Kara Dean, daughter,verbalized understanding of instructions provided today and declined a print copy of patient instruction materials.   Kara Dean MSW,  LCSW Licensed Clinical Social Worker [facility] [phone]

## 2020-05-05 ENCOUNTER — Other Ambulatory Visit: Payer: Self-pay

## 2020-05-05 ENCOUNTER — Ambulatory Visit (INDEPENDENT_AMBULATORY_CARE_PROVIDER_SITE_OTHER): Payer: Medicare Other | Admitting: Nurse Practitioner

## 2020-05-05 ENCOUNTER — Encounter: Payer: Self-pay | Admitting: Nurse Practitioner

## 2020-05-05 VITALS — BP 140/54 | HR 50 | Temp 98.5°F | Resp 20 | Ht 59.0 in | Wt 125.0 lb

## 2020-05-05 DIAGNOSIS — K219 Gastro-esophageal reflux disease without esophagitis: Secondary | ICD-10-CM | POA: Diagnosis not present

## 2020-05-05 DIAGNOSIS — N1831 Chronic kidney disease, stage 3a: Secondary | ICD-10-CM

## 2020-05-05 DIAGNOSIS — F3342 Major depressive disorder, recurrent, in full remission: Secondary | ICD-10-CM

## 2020-05-05 DIAGNOSIS — E119 Type 2 diabetes mellitus without complications: Secondary | ICD-10-CM | POA: Diagnosis not present

## 2020-05-05 DIAGNOSIS — E785 Hyperlipidemia, unspecified: Secondary | ICD-10-CM | POA: Diagnosis not present

## 2020-05-05 DIAGNOSIS — R609 Edema, unspecified: Secondary | ICD-10-CM

## 2020-05-05 DIAGNOSIS — Z794 Long term (current) use of insulin: Secondary | ICD-10-CM | POA: Diagnosis not present

## 2020-05-05 DIAGNOSIS — I2510 Atherosclerotic heart disease of native coronary artery without angina pectoris: Secondary | ICD-10-CM

## 2020-05-05 DIAGNOSIS — I10 Essential (primary) hypertension: Secondary | ICD-10-CM | POA: Diagnosis not present

## 2020-05-05 DIAGNOSIS — F411 Generalized anxiety disorder: Secondary | ICD-10-CM

## 2020-05-05 DIAGNOSIS — E7800 Pure hypercholesterolemia, unspecified: Secondary | ICD-10-CM

## 2020-05-05 DIAGNOSIS — I6930 Unspecified sequelae of cerebral infarction: Secondary | ICD-10-CM

## 2020-05-05 LAB — BAYER DCA HB A1C WAIVED: HB A1C (BAYER DCA - WAIVED): 7 % — ABNORMAL HIGH (ref <7.0)

## 2020-05-05 MED ORDER — INSULIN GLARGINE 100 UNIT/ML ~~LOC~~ SOLN: 48.0000 [IU] | Freq: Every morning | SUBCUTANEOUS | 5 refills | Status: DC

## 2020-05-05 MED ORDER — AMLODIPINE BESYLATE 10 MG PO TABS: 10.0000 mg | ORAL_TABLET | Freq: Every day | ORAL | 1 refills | Status: DC

## 2020-05-05 MED ORDER — METOPROLOL SUCCINATE ER 100 MG PO TB24: ORAL_TABLET | ORAL | 1 refills | Status: DC

## 2020-05-05 MED ORDER — POTASSIUM CHLORIDE ER 10 MEQ PO TBCR: 10.0000 meq | EXTENDED_RELEASE_TABLET | Freq: Every day | ORAL | 1 refills | Status: DC

## 2020-05-05 MED ORDER — ASPIRIN-DIPYRIDAMOLE ER 25-200 MG PO CP12: ORAL_CAPSULE | ORAL | 1 refills | Status: DC

## 2020-05-05 MED ORDER — OLMESARTAN MEDOXOMIL 40 MG PO TABS: 40.0000 mg | ORAL_TABLET | Freq: Every day | ORAL | 1 refills | Status: DC

## 2020-05-05 MED ORDER — ATORVASTATIN CALCIUM 10 MG PO TABS: 10.0000 mg | ORAL_TABLET | Freq: Every day | ORAL | 1 refills | Status: DC

## 2020-05-05 MED ORDER — FUROSEMIDE 20 MG PO TABS: 20.0000 mg | ORAL_TABLET | Freq: Every day | ORAL | 1 refills | Status: DC

## 2020-05-05 MED ORDER — CITALOPRAM HYDROBROMIDE 20 MG PO TABS: 20.0000 mg | ORAL_TABLET | Freq: Every day | ORAL | 1 refills | Status: DC

## 2020-05-05 MED ORDER — PANTOPRAZOLE SODIUM 40 MG PO TBEC: 40.0000 mg | DELAYED_RELEASE_TABLET | Freq: Every day | ORAL | 1 refills | Status: DC

## 2020-05-05 NOTE — Progress Notes (Signed)
Subjective:    Patient ID: Kara Dean, female    DOB: 02/05/1933, 86 y.o.   MRN: [id number]   Chief Complaint: Medical Management of Chronic Issues    HPI:  1. Hyperlipidemia, unspecified hyperlipidemia type States she sometimes watches her fat intake. Reports walking once per day until she gets tired.  Lab Results  Component Value Date   CHOL 107 02/01/2020   HDL 47 02/01/2020   LDLCALC 39 02/01/2020   TRIG 120 02/01/2020   CHOLHDL 2.3 02/01/2020     2. Essential hypertension Reports watching her sodium intake. Reports checking her blood pressure sometimes.  BP Readings from Last 3 Encounters:  03/27/20 138/60  02/14/20 (!) 148/57  02/01/20 (!) 141/52     3. Type 2 diabetes mellitus treated with insulin (HCC) Reports checking her fasting CBGs every morning. Reports it being around 82 on a typical day.  Lab Results  Component Value Date   HGBA1C 7.8 (H) 02/01/2020     4. Gastroesophageal reflux disease without esophagitis Avoids trigger foods. Waits at least 30 minutes prior to laying down after eating dinner to avoid reflux.   5. Chronic kidney disease (CKD) stage G3a/A3, moderately decreased glomerular filtration rate (GFR) between 45-59 mL/min/1.73 square meter and albuminuria creatinine ratio greater than 300 mg/g Makes sure to have an adequate amount of fluid intake daily to aid in kidney regulation.   Lab Results  Component Value Date   CREATININE 1.28 (H) 02/01/2020   BUN 16 02/01/2020   NA 142 02/01/2020   K 4.5 02/01/2020   CL 105 02/01/2020   CO2 22 02/01/2020     6. Recurrent major depressive disorder, in full remission ([place]) Reports feeling depressed sometimes, not often.   7. Late effect of cerebrovascular accident (CVA) Reports feeling weaker with age, is unable to take a tub-bath because she is unable to get out.   8. GAD (generalized anxiety disorder) Reports no anxiety episodes.   9. Peripheral edema Reports elevating her legs  at home sometimes. States they are not swelling much lately.     Outpatient Encounter Medications as of 05/05/2020  Medication Sig  . amLODipine (NORVASC) 10 MG tablet Take 1 tablet (10 mg total) by mouth daily.  Marland Kitchen atorvastatin (LIPITOR) 10 MG tablet Take 1 tablet (10 mg total) by mouth daily.  . B Complex Vitamins (B COMPLEX-B12) TABS Take 1 tablet by mouth daily.    . Cholecalciferol (VITAMIN D3 PO) Take 1 capsule daily by mouth.  . citalopram (CELEXA) 20 MG tablet Take 1 tablet (20 mg total) by mouth daily.  Marland Kitchen dipyridamole-aspirin (AGGRENOX) 200-25 MG 12hr capsule TAKE  (1)  CAPSULE  TWICE DAILY.  . fluticasone (FLONASE) 50 MCG/ACT nasal spray USE 2 SPRAYS IN EACH NOSTRIL ONCE DAILY.  . furosemide (LASIX) 20 MG tablet Take 1 tablet (20 mg total) by mouth daily.  . insulin glargine (LANTUS) 100 UNIT/ML injection Inject 0.48 mLs (48 Units total) into the skin every morning.  Marland Kitchen LORazepam (ATIVAN) 0.5 MG tablet TAKE (1) TABLET TWICE A DAY.  . Magnesium 250 MG TABS Take 250 mg daily by mouth.   . metoprolol succinate (TOPROL-XL) 100 MG 24 hr tablet Take with or immediately following a meal.  . olmesartan (BENICAR) 40 MG tablet Take 1 tablet (40 mg total) by mouth daily.  . Omega-3 Fatty Acids (FISH OIL) 1000 MG CAPS Take 1 capsule by mouth daily.  Glory Rosebush ULTRA test strip CHECK BLOOD SUGER UP TO 3 TIMES A  DAY  . pantoprazole (PROTONIX) 40 MG tablet Take 1 tablet (40 mg total) by mouth daily.  . potassium chloride (KLOR-CON) 10 MEQ tablet Take 1 tablet (10 mEq total) by mouth daily.   No facility-administered encounter medications on file as of 05/05/2020.    Past Surgical History:  Procedure Laterality Date  . ABDOMINAL HYSTERECTOMY    . BACK SURGERY  1994  . CATARACT EXTRACTION W/PHACO Left 09/19/2017   Procedure: CATARACT EXTRACTION PHACO AND INTRAOCULAR LENS PLACEMENT (IOC);  Surgeon: Baruch Goldmann, MD;  Location: [facility];  Service: Ophthalmology;  Laterality: Left;  CDE: 8.10  .  CATARACT EXTRACTION W/PHACO Right 10/10/2017   Procedure: CATARACT EXTRACTION PHACO AND INTRAOCULAR LENS PLACEMENT RIGHT EYE;  Surgeon: Baruch Goldmann, MD;  Location: [facility];  Service: Ophthalmology;  Laterality: Right;  CDE: 9.60  . COLON RESECTION  1990's  . TUBAL LIGATION    . VESICOVAGINAL FISTULA CLOSURE W/ TAH  1984    Family History  Problem Relation Age of Onset  . Coronary artery disease Sister        stent x 3  . Seizures Sister   . Coronary artery disease Brother        stent  . Hypertension Mother   . Stroke Mother   . Emphysema Father   . Alcohol abuse Brother   . Lung disease Brother   . Heart attack Daughter     New complaints: No new complaints today.  Social history: Lives at home with her granddaughter and cat.   Controlled substance contract: n/a     Review of Systems  Constitutional: Negative.   HENT: Negative.   Eyes: Negative.   Respiratory: Negative.   Cardiovascular: Negative.   Gastrointestinal: Negative.   Endocrine: Negative.   Genitourinary: Negative.   Musculoskeletal: Negative.   Skin: Negative.   Allergic/Immunologic: Negative.   Neurological: Negative.   Hematological: Negative.   Psychiatric/Behavioral: Negative.        Objective:   Physical Exam Constitutional:      Appearance: Normal appearance. She is normal weight.  HENT:     Head: Normocephalic and atraumatic.     Right Ear: Tympanic membrane, ear canal and external ear normal.     Left Ear: Tympanic membrane, ear canal and external ear normal.     Nose: Nose normal.     Mouth/Throat:     Mouth: Mucous membranes are moist.     Pharynx: Oropharynx is clear.  Eyes:     Extraocular Movements: Extraocular movements intact.     Conjunctiva/sclera: Conjunctivae normal.     Pupils: Pupils are equal, round, and reactive to light.  Cardiovascular:     Rate and Rhythm: Regular rhythm. Bradycardia present.     Pulses: Normal pulses.     Heart sounds: Murmur (3/6 systolic  murmur) heard.   Pulmonary:     Effort: Pulmonary effort is normal.     Breath sounds: Normal breath sounds.  Abdominal:     General: Abdomen is flat. Bowel sounds are normal.     Palpations: Abdomen is soft.  Genitourinary:    General: Normal vulva.     Rectum: Normal.  Musculoskeletal:        General: Normal range of motion.     Cervical back: Normal range of motion and neck supple.  Skin:    General: Skin is warm.     Capillary Refill: Capillary refill takes less than 2 seconds.  Neurological:     General: No focal deficit  present.     Mental Status: She is alert and oriented to person, place, and time. Mental status is at baseline.  Psychiatric:        Mood and Affect: Mood normal.        Behavior: Behavior normal.        Thought Content: Thought content normal.        Judgment: Judgment normal.     BP (!) 140/54   Pulse (!) 50   Temp 98.5 F (36.9 C) (Temporal)   Resp 20   Ht 4' 11" (1.499 m)   Wt 125 lb (56.7 kg)   SpO2 96%   BMI 25.25 kg/m   HGBA1c 7.0%     Assessment & Plan:  JERMIA RIGSBY comes in today with chief complaint of Medical Management of Chronic Issues   Diagnosis and orders addressed:  1. Hyperlipidemia, unspecified hyperlipidemia type Patient instructed to follow a fat-restricted diet and exercise is encouraged as tolerated including active/passive ROM exercises. - Lipid panel  2. Essential hypertension Encouraged to check BP regularly at home and follow a sodium-restricted diet.  - CBC with Differential/Platelet - CMP14+EGFR  3. Type 2 diabetes mellitus treated with insulin (HCC) Instructed to check fasting CBGs daily and follow a carb-modified diet.  - Bayer DCA Hb A1c Waived - Microalbumin / creatinine urine ratio  4. Gastroesophageal reflux disease without esophagitis Encouraged to wait at least 30 minutes after eating before laying down. Encouraged to continue and maintain current treatment regimen. Avoid trigger foods.   5.  Chronic kidney disease (CKD) stage G3a/A3, moderately decreased glomerular filtration rate (GFR) between 45-59 mL/min/1.73 square meter and albuminuria creatinine ratio greater than 300 mg/g Encouraged to maintain regular follow-ups for creatinine checks. Encouraged to drink an adequate amount of water daily and avoid alcohol.   6. Recurrent major depressive disorder, in full remission ([place]) Encouraged to to journal depressive episodes and report feelings of self-harm. Instructed to maintain current treatment regimen.  7. Late effect of cerebrovascular accident (CVA) Instructed on fall-prevention. Encouraged to report any signs of increased weakness especially one-sided.   8. GAD (generalized anxiety disorder) Encouraged to journal anxious episodes and report them at future visits. Maintain current treatment regimen for controlled anxiety.   9. Peripheral edema Encouraged to restrict flui and sodium intake and elevate legs when sitting.  Meds ordered this encounter  Medications  . metoprolol succinate (TOPROL-XL) 100 MG 24 hr tablet    Sig: Take with or immediately following a meal.    Dispense:  90 tablet    Refill:  1    Order Specific Question:   Supervising Provider    Answer:   Caryl Pina A [[id number]]  . dipyridamole-aspirin (AGGRENOX) 200-25 MG 12hr capsule    Sig: TAKE  (1)  CAPSULE  TWICE DAILY.    Dispense:  180 capsule    Refill:  1    Order Specific Question:   Supervising Provider    Answer:   Caryl Pina A [id number]  . olmesartan (BENICAR) 40 MG tablet    Sig: Take 1 tablet (40 mg total) by mouth daily.    Dispense:  90 tablet    Refill:  1    Order Specific Question:   Supervising Provider    Answer:   Caryl Pina A [id number]  . amLODipine (NORVASC) 10 MG tablet    Sig: Take 1 tablet (10 mg total) by mouth daily.    Dispense:  90 tablet    Refill:  1    Order Specific Question:   Supervising Provider    Answer:   Caryl Pina A [id number]    . potassium chloride (KLOR-CON) 10 MEQ tablet    Sig: Take 1 tablet (10 mEq total) by mouth daily.    Dispense:  90 tablet    Refill:  1    Order Specific Question:   Supervising Provider    Answer:   Caryl Pina A [id number]  . pantoprazole (PROTONIX) 40 MG tablet    Sig: Take 1 tablet (40 mg total) by mouth daily.    Dispense:  90 tablet    Refill:  1    Order Specific Question:   Supervising Provider    Answer:   Caryl Pina A [id number]  . furosemide (LASIX) 20 MG tablet    Sig: Take 1 tablet (20 mg total) by mouth daily.    Dispense:  90 tablet    Refill:  1    Order Specific Question:   Supervising Provider    Answer:   Caryl Pina A [id number]  . atorvastatin (LIPITOR) 10 MG tablet    Sig: Take 1 tablet (10 mg total) by mouth daily.    Dispense:  90 tablet    Refill:  1    Order Specific Question:   Supervising Provider    Answer:   Caryl Pina A [id number]  . citalopram (CELEXA) 20 MG tablet    Sig: Take 1 tablet (20 mg total) by mouth daily.    Dispense:  90 tablet    Refill:  1    Order Specific Question:   Supervising Provider    Answer:   Caryl Pina A [id number]  . insulin glargine (LANTUS) 100 UNIT/ML injection    Sig: Inject 0.48 mLs (48 Units total) into the skin every morning.    Dispense:  30 mL    Refill:  5    Order Specific Question:   Supervising Provider    Answer:   Caryl Pina A [id number]     Labs pending Health Maintenance reviewed Diet and exercise encouraged  Follow up plan: Follow-up in 3 months   Kara Dean, BSN, RN [place], FNP

## 2020-05-05 NOTE — Patient Instructions (Signed)

## 2020-05-06 LAB — CMP14+EGFR
ALT: 8 IU/L (ref 0–32)
AST: 16 IU/L (ref 0–40)
Albumin/Globulin Ratio: 1.5 (ref 1.2–2.2)
Albumin: 4.2 g/dL (ref 3.6–4.6)
Alkaline Phosphatase: 66 IU/L (ref 48–121)
BUN/Creatinine Ratio: 12 (ref 12–28)
BUN: 17 mg/dL (ref 8–27)
Bilirubin Total: 0.5 mg/dL (ref 0.0–1.2)
CO2: 22 mmol/L (ref 20–29)
Calcium: 10.1 mg/dL (ref 8.7–10.3)
Chloride: 103 mmol/L (ref 96–106)
Creatinine, Ser: 1.4 mg/dL — ABNORMAL HIGH (ref 0.57–1.00)
GFR calc Af Amer: 40 mL/min/{1.73_m2} — ABNORMAL LOW (ref >59)
GFR calc non Af Amer: 35 mL/min/{1.73_m2} — ABNORMAL LOW (ref >59)
Globulin, Total: 2.8 g/dL (ref 1.5–4.5)
Glucose: 113 mg/dL — ABNORMAL HIGH (ref 65–99)
Potassium: 4.9 mmol/L (ref 3.5–5.2)
Sodium: 141 mmol/L (ref 134–144)
Total Protein: 7 g/dL (ref 6.0–8.5)

## 2020-05-06 LAB — CBC WITH DIFFERENTIAL/PLATELET
Basophils Absolute: 0.1 10*3/uL (ref 0.0–0.2)
Basos: 1 %
EOS (ABSOLUTE): 0.3 10*3/uL (ref 0.0–0.4)
Eos: 3 %
Hematocrit: 36.3 % (ref 34.0–46.6)
Hemoglobin: 11.9 g/dL (ref 11.1–15.9)
Immature Grans (Abs): 0 10*3/uL (ref 0.0–0.1)
Immature Granulocytes: 0 %
Lymphocytes Absolute: 1.2 10*3/uL (ref 0.7–3.1)
Lymphs: 14 %
MCH: 29.8 pg (ref 26.6–33.0)
MCHC: 32.8 g/dL (ref 31.5–35.7)
MCV: 91 fL (ref 79–97)
Monocytes Absolute: 0.8 10*3/uL (ref 0.1–0.9)
Monocytes: 9 %
Neutrophils Absolute: 6.6 10*3/uL (ref 1.4–7.0)
Neutrophils: 73 %
Platelets: 202 10*3/uL (ref 150–450)
RBC: 3.99 x10E6/uL (ref 3.77–5.28)
RDW: 13 % (ref 11.7–15.4)
WBC: 8.9 10*3/uL (ref 3.4–10.8)

## 2020-05-06 LAB — LIPID PANEL
Chol/HDL Ratio: 2.6 ratio (ref 0.0–4.4)
Cholesterol, Total: 113 mg/dL (ref 100–199)
HDL: 44 mg/dL (ref >39)
LDL Chol Calc (NIH): 44 mg/dL (ref 0–99)
Triglycerides: 146 mg/dL (ref 0–149)
VLDL Cholesterol Cal: 25 mg/dL (ref 5–40)

## 2020-05-25 DIAGNOSIS — R112 Nausea with vomiting, unspecified: Secondary | ICD-10-CM | POA: Diagnosis not present

## 2020-05-25 DIAGNOSIS — I499 Cardiac arrhythmia, unspecified: Secondary | ICD-10-CM | POA: Diagnosis not present

## 2020-06-06 ENCOUNTER — Ambulatory Visit (INDEPENDENT_AMBULATORY_CARE_PROVIDER_SITE_OTHER): Payer: Medicare Other | Admitting: Licensed Clinical Social Worker

## 2020-06-06 DIAGNOSIS — I6930 Unspecified sequelae of cerebral infarction: Secondary | ICD-10-CM

## 2020-06-06 DIAGNOSIS — E785 Hyperlipidemia, unspecified: Secondary | ICD-10-CM

## 2020-06-06 DIAGNOSIS — F3342 Major depressive disorder, recurrent, in full remission: Secondary | ICD-10-CM | POA: Diagnosis not present

## 2020-06-06 DIAGNOSIS — E119 Type 2 diabetes mellitus without complications: Secondary | ICD-10-CM | POA: Diagnosis not present

## 2020-06-06 DIAGNOSIS — N1831 Chronic kidney disease, stage 3a: Secondary | ICD-10-CM

## 2020-06-06 DIAGNOSIS — F411 Generalized anxiety disorder: Secondary | ICD-10-CM

## 2020-06-06 DIAGNOSIS — I10 Essential (primary) hypertension: Secondary | ICD-10-CM

## 2020-06-06 DIAGNOSIS — Z794 Long term (current) use of insulin: Secondary | ICD-10-CM

## 2020-06-06 DIAGNOSIS — K219 Gastro-esophageal reflux disease without esophagitis: Secondary | ICD-10-CM

## 2020-06-06 NOTE — Chronic Care Management (AMB) (Signed)
Chronic Care Management    Clinical Social Work Follow Up Note  06/06/2020 Name: Kara Dean MRN: [id number] DOB: 1933/10/19  Kara Dean is a 86 y.o. year old female who is a primary care patient of Kara Dean, [place]. The CCM team was consulted for assistance with [employer] .   Review of patient status, including review of consultants reports, other relevant assessments, and collaboration with appropriate care team members and the patient's provider was performed as part of comprehensive patient evaluation and provision of chronic care management services.    SDOH (Social Determinants of Health) assessments performed: No;risk of social isolation; risk of depression; risk of stress; risk of physical inactivity    Chronic Care Management from 03/03/[place] [facility]   PHQ-9 Total Score 6     GAD 7 : Generalized Anxiety Score 03/03/2020 02/01/2020  Nervous, Anxious, on Edge 1 1  Control/stop worrying 1 1  Worry too much - different things 1 1  Trouble relaxing 1 0  Restless 0 0  Easily annoyed or irritable 0 1  Afraid - awful might happen 0 1  Total GAD 7 Score 4 5  Anxiety Difficulty Somewhat difficult Not difficult at all    Outpatient Encounter Medications as of 06/06/2020  Medication Sig  . amLODipine (NORVASC) 10 MG tablet Take 1 tablet (10 mg total) by mouth daily.  Marland Kitchen atorvastatin (LIPITOR) 10 MG tablet Take 1 tablet (10 mg total) by mouth daily.  . B Complex Vitamins (B COMPLEX-B12) TABS Take 1 tablet by mouth daily.    . Cholecalciferol (VITAMIN D3 PO) Take 1 capsule daily by mouth.  . citalopram (CELEXA) 20 MG tablet Take 1 tablet (20 mg total) by mouth daily.  Marland Kitchen dipyridamole-aspirin (AGGRENOX) 200-25 MG 12hr capsule TAKE  (1)  CAPSULE  TWICE DAILY.  . fluticasone (FLONASE) 50 MCG/ACT nasal spray USE 2 SPRAYS IN EACH NOSTRIL ONCE DAILY.  . furosemide (LASIX) 20 MG tablet Take 1 tablet (20 mg total) by mouth daily.  . insulin  glargine (LANTUS) 100 UNIT/ML injection Inject 0.48 mLs (48 Units total) into the skin every morning.  Marland Kitchen LORazepam (ATIVAN) 0.5 MG tablet TAKE (1) TABLET TWICE A DAY.  . Magnesium 250 MG TABS Take 250 mg daily by mouth.   . metoprolol succinate (TOPROL-XL) 100 MG 24 hr tablet Take with or immediately following a meal.  . olmesartan (BENICAR) 40 MG tablet Take 1 tablet (40 mg total) by mouth daily.  . Omega-3 Fatty Acids (FISH OIL) 1000 MG CAPS Take 1 capsule by mouth daily.  Glory Rosebush ULTRA test strip CHECK BLOOD SUGER UP TO 3 TIMES A DAY  . pantoprazole (PROTONIX) 40 MG tablet Take 1 tablet (40 mg total) by mouth daily.  . potassium chloride (KLOR-CON) 10 MEQ tablet Take 1 tablet (10 mEq total) by mouth daily.   No facility-administered encounter medications on file as of 06/06/2020.    Goals Addressed              This Visit's Progress   .  Client will talk with LCSW in next 30 days to talk about anxiety or depression issues of client (pt-stated)        CARE PLAN ENTRY   Current Barriers:  Marland Kitchen Mobility issues . Anxiety issues of client with chronic diagnoses of HLD, Depression HTN, Hx CVA, OA, Type 2 DM, GERD, GAD, CKD  Clinical Socialp Work Clinical Goal(s):  Marland Kitchen LCSW to call client/daugher in next 30 days to tlak  about anxiety or depression issues of clinet  Interventions: . LCSW talked with client about relaxation techniques of client (likes to visit with children, likes to ride in car, watches TV, likes to listen to religous service,likes to talk via phone with relatives) . Talked with client about CCM program  . Talked with [name], daughter of client about decreased appetite of client . Talked with Vickii Chafe about sleeping challenges of client . Talked with Vickii Chafe about pain issues of client . Talked with Vickii Chafe about mobility challenges of client  . Talked with Vickii Chafe about anxiety issues of client (family stress issues) . Talked about social support network (daughter is  supportive) . Talked with [name] about client food stamps benefit . Talked with [name] about medication procurement of cline . Talked with Vickii Chafe about ADLs completion of client  Patient Self Care Activities:  Completes ADLs Takes medications as prescribed Attends medical appointment  Patient Self Care Deficits:  Marland Kitchen Mobility issues . Anxiety issues  Initial goal documentation       Follow Up Plan: LCSW to call client or her daughter in the next 4 weeks to talk with client/daughter about anxiety or depression issues of client  Norva Riffle.Georgena Weisheit MSW, LCSW Licensed Clinical Social Worker [facility] Care Management [phone]

## 2020-06-06 NOTE — Patient Instructions (Addendum)
Licensed Clinical Social Worker Visit Information  Goals we discussed today:  Goals Addressed              This Visit's Progress   .  Client will talk with LCSW in next 30 days to talk about anxiety or depression issues of client (pt-stated)        CARE PLAN ENTRY   Current Barriers:  Marland Kitchen Mobility issues . Anxiety issues of client with chronic diagnoses of HLD, Depression HTN, Hx CVA, OA, Type 2 DM, GERD, GAD, CKD  Clinical Social Work Clinical Goal(s):  Marland Kitchen LCSW to call client/daugher in next 30 days to tlak about anxiety or depression issues of clinet  Interventions:  Client will talk with LCSW in next 30 days to talk about anxiety or depression issues of client (pt-stated)           CARE PLAN ENTRY   Current Barriers:   Mobility issues  Anxiety issues of client with chronic diagnoses of HLD, Depression HTN, Hx CVA, OA, Type 2 DM, GERD, GAD, CKD  Clinical Socialp Work Clinical Goal(s):   LCSW to call client/daugher in next 30 days to tlak about anxiety or depression issues of clinet  Interventions:  LCSW talked with client about relaxation techniques of client (likes to visit with children, likes to ride in car, watches TV, likes to listen to religous service,likes to talk via phone with relatives)  Talked with client about CCM program   Talked with [name], daughter of client about decreased appetite of client  Talked with Vickii Chafe about sleeping challenges of client  Talked with Vickii Chafe about pain issues of client  Talked with Vickii Chafe about mobility challenges of client   Talked with Vickii Chafe about anxiety issues of client (family stress issues)  Talked about social support network (daughter is supportive)  Talked with [name] about client food stamps benefit  Talked with Vickii Chafe about medication procurement of client   Talked with Vickii Chafe about ADLs completion of client  Patient Self Care Activities:  Completes ADLs Takes medications as  prescribed Attends medical appointment  Patient Self Care Deficits:   Mobility issues  Anxiety issues  Initial goal documentation       Follow Up Plan:LCSW to call client or her daughter in the next 4 weeks to talk with client/daughter about anxiety or depression issues of client  Initial goal documentation       Materials Provided: No  The patient/[name] Grandville Silos, daughter,  verbalized understanding of instructions provided today and declined a print copy of patient instruction materials.   Norva Riffle.Trelon Plush MSW, LCSW Licensed Clinical Social Worker [facility] Care Management [phone]

## 2020-06-08 DIAGNOSIS — B351 Tinea unguium: Secondary | ICD-10-CM | POA: Diagnosis not present

## 2020-06-08 DIAGNOSIS — E1142 Type 2 diabetes mellitus with diabetic polyneuropathy: Secondary | ICD-10-CM | POA: Diagnosis not present

## 2020-06-08 DIAGNOSIS — M79676 Pain in unspecified toe(s): Secondary | ICD-10-CM | POA: Diagnosis not present

## 2020-06-08 DIAGNOSIS — L84 Corns and callosities: Secondary | ICD-10-CM | POA: Diagnosis not present

## 2020-07-11 ENCOUNTER — Ambulatory Visit: Payer: Medicare Other | Admitting: Licensed Clinical Social Worker

## 2020-07-11 DIAGNOSIS — F3342 Major depressive disorder, recurrent, in full remission: Secondary | ICD-10-CM

## 2020-07-11 DIAGNOSIS — E785 Hyperlipidemia, unspecified: Secondary | ICD-10-CM

## 2020-07-11 DIAGNOSIS — I6930 Unspecified sequelae of cerebral infarction: Secondary | ICD-10-CM

## 2020-07-11 DIAGNOSIS — F411 Generalized anxiety disorder: Secondary | ICD-10-CM

## 2020-07-11 DIAGNOSIS — I10 Essential (primary) hypertension: Secondary | ICD-10-CM

## 2020-07-11 DIAGNOSIS — K219 Gastro-esophageal reflux disease without esophagitis: Secondary | ICD-10-CM

## 2020-07-11 DIAGNOSIS — E119 Type 2 diabetes mellitus without complications: Secondary | ICD-10-CM

## 2020-07-11 DIAGNOSIS — N1831 Chronic kidney disease, stage 3a: Secondary | ICD-10-CM

## 2020-07-11 NOTE — Chronic Care Management (AMB) (Signed)
Chronic Care Management    Clinical Social Work Follow Up Note  07/11/2020 Name: Kara Dean MRN: [id number] DOB: 09/28/1933  Kara Dean is a 86 y.o. year old female who is a primary care patient of [name], [place]. The CCM team was consulted for assistance with [employer] .   Review of patient status, including review of consultants reports, other relevant assessments, and collaboration with appropriate care team members and the patient's provider was performed as part of comprehensive patient evaluation and provision of chronic care management services.    SDOH (Social Determinants of Health) assessments performed: No;risk for social isolation; risk for depression; risk for stress; risk for physical inactivity    Chronic Care Management from 03/03/2020 in [place]  PHQ-9 Total Score 6      GAD 7 : Generalized Anxiety Score 03/03/2020 02/01/2020  Nervous, Anxious, on Edge 1 1  Control/stop worrying 1 1  Worry too much - different things 1 1  Trouble relaxing 1 0  Restless 0 0  Easily annoyed or irritable 0 1  Afraid - awful might happen 0 1  Total GAD 7 Score 4 5  Anxiety Difficulty Somewhat difficult Not difficult at all    Outpatient Encounter Medications as of 07/11/2020  Medication Sig  . amLODipine (NORVASC) 10 MG tablet Take 1 tablet (10 mg total) by mouth daily.  Marland Kitchen atorvastatin (LIPITOR) 10 MG tablet Take 1 tablet (10 mg total) by mouth daily.  . B Complex Vitamins (B COMPLEX-B12) TABS Take 1 tablet by mouth daily.    . Cholecalciferol (VITAMIN D3 PO) Take 1 capsule daily by mouth.  . citalopram (CELEXA) 20 MG tablet Take 1 tablet (20 mg total) by mouth daily.  Marland Kitchen dipyridamole-aspirin (AGGRENOX) 200-25 MG 12hr capsule TAKE  (1)  CAPSULE  TWICE DAILY.  . fluticasone (FLONASE) 50 MCG/ACT nasal spray USE 2 SPRAYS IN EACH NOSTRIL ONCE DAILY.  . furosemide (LASIX) 20 MG tablet Take 1 tablet (20 mg total) by mouth daily.  .  insulin glargine (LANTUS) 100 UNIT/ML injection Inject 0.48 mLs (48 Units total) into the skin every morning.  Marland Kitchen LORazepam (ATIVAN) 0.5 MG tablet TAKE (1) TABLET TWICE A DAY.  . Magnesium 250 MG TABS Take 250 mg daily by mouth.   . metoprolol succinate (TOPROL-XL) 100 MG 24 hr tablet Take with or immediately following a meal.  . olmesartan (BENICAR) 40 MG tablet Take 1 tablet (40 mg total) by mouth daily.  . Omega-3 Fatty Acids (FISH OIL) 1000 MG CAPS Take 1 capsule by mouth daily.  Glory Rosebush ULTRA test strip CHECK BLOOD SUGER UP TO 3 TIMES A DAY  . pantoprazole (PROTONIX) 40 MG tablet Take 1 tablet (40 mg total) by mouth daily.  . potassium chloride (KLOR-CON) 10 MEQ tablet Take 1 tablet (10 mEq total) by mouth daily.   No facility-administered encounter medications on file as of 07/11/2020.    Goals    .  Client will talk with LCSW in next 30 days to talk about anxiety or depression issues of client (pt-stated)      CARE PLAN ENTRY   Current Barriers:  Marland Kitchen Mobility issues . Anxiety issues of client with chronic diagnoses of HLD, Depression HTN, Hx CVA, OA, Type 2 DM, GERD, GAD, CKD  Clinical Social Work Clinical Goal(s):  Marland Kitchen LCSW to call client/daugher in next 30 days to tlak about anxiety or depression issues of clinet  Interventions: . LCSW talked with client about relaxation techniques  of client (likes to visit with children, likes to ride in car, watches TV, likes to listen to religous service,likes to talk via phone with relatives) . Talked with client about CCM program  . Talked with client about decreased appetite . Talked with client about sleeping challenges . Talked with client about pain issues of client . Talked with client about mobility challenges  . Talked with client about anxiety issues of client (family stress issues) . Talked with client about social support network (daughter is supportive) .  Talked with client about medication procurement . Talked with client  about ADLs completion of client . Talked with client about vision issues of client (she said she has dry eyes and that it would be next January before she could be seen at [facility] in [place], [place]) . Talked with client about transport needs of client . Talked with [place] about in home support from her granddaughter . Talked with client about her upcoming medical appointment with [name] on August 21, 2020   Patient Self Care Activities:  Completes ADLs Takes medications as prescribed Attends medical appointment  Patient Self Care Deficits:  Marland Kitchen Mobility issues . Anxiety issues  Initial goal documentation     Follow Up Plan:LCSW to call client or her daughter in the next 4 weeks to talk with client/daughter about anxiety or depression issues of client  Norva Riffle.Lailany Enoch MSW, LCSW Licensed Clinical Social Worker [facility] [phone]

## 2020-07-11 NOTE — Patient Instructions (Addendum)
Licensed Clinical Social Worker Visit Information  Goals we discussed today:    .  Client will talk with LCSW in next 30 days to talk about anxiety or depression issues of client (pt-stated)        CARE PLAN ENTRY   Current Barriers:   Mobility issues  Anxiety issues of client with chronic diagnoses of HLD, Depression HTN, Hx CVA, OA, Type 2 DM, GERD, GAD, CKD  Clinical Social Work Clinical Goal(s):   LCSW to call client/daugher in next 30 days to tlak about anxiety or depression issues of clinet  Interventions:  LCSW talked with client about relaxation techniques of client (likes to visit with children, likes to ride in car, watches TV, likes to listen to religous service,likes to talk via phone with relatives)  Talked with client about CCM program   Talked with client about decreased appetite  Talked with client about sleeping challenges  Talked with client about pain issues of client  Talked with client about mobility challenges   Talked with client about anxiety issues of client (family stress issues)  Talked with client about social support network (daughter is supportive)   Talked with client about medication procurement  Talked with client about ADLs completion of client  Talked with client about vision issues of client (she said she has dry eyes and that it would be next January before she could be seen at [facility] in [place], [place])  Talked with client about transport needs of client  Talked with Adessa about in home support from her granddaughter  Talked with client about her upcoming medical appointment with [name] on August 21, 2020   Patient Self Care Activities:  Completes ADLs Takes medications as prescribed Attends medical appointment  Patient Self Care Deficits:   Mobility issues  Anxiety issues  Initial goal documentation     Follow Up Plan:LCSW to call client or her daughter in the next 4 weeks to talk with  client/daughter about anxiety or depression issues of client  Materials Provided: No  The patient verbalized understanding of instructions provided today and declined a print copy of patient instruction materials.   Norva Riffle.Faith Patricelli MSW, LCSW Licensed Clinical Social Worker [facility] [phone]

## 2020-07-28 DIAGNOSIS — R609 Edema, unspecified: Secondary | ICD-10-CM | POA: Diagnosis not present

## 2020-07-28 DIAGNOSIS — W19XXXA Unspecified fall, initial encounter: Secondary | ICD-10-CM | POA: Diagnosis not present

## 2020-07-28 DIAGNOSIS — Z743 Need for continuous supervision: Secondary | ICD-10-CM | POA: Diagnosis not present

## 2020-08-14 ENCOUNTER — Telehealth: Payer: Medicare Other

## 2020-08-16 ENCOUNTER — Other Ambulatory Visit: Payer: Self-pay | Admitting: Nurse Practitioner

## 2020-08-16 DIAGNOSIS — F411 Generalized anxiety disorder: Secondary | ICD-10-CM

## 2020-08-21 ENCOUNTER — Encounter: Payer: Self-pay | Admitting: Nurse Practitioner

## 2020-08-21 ENCOUNTER — Ambulatory Visit (INDEPENDENT_AMBULATORY_CARE_PROVIDER_SITE_OTHER): Payer: Medicare Other | Admitting: Nurse Practitioner

## 2020-08-21 ENCOUNTER — Other Ambulatory Visit: Payer: Self-pay

## 2020-08-21 VITALS — BP 158/49 | HR 50 | Temp 98.7°F | Ht 59.0 in | Wt 125.8 lb

## 2020-08-21 DIAGNOSIS — F3342 Major depressive disorder, recurrent, in full remission: Secondary | ICD-10-CM

## 2020-08-21 DIAGNOSIS — E119 Type 2 diabetes mellitus without complications: Secondary | ICD-10-CM | POA: Diagnosis not present

## 2020-08-21 DIAGNOSIS — E785 Hyperlipidemia, unspecified: Secondary | ICD-10-CM

## 2020-08-21 DIAGNOSIS — I6930 Unspecified sequelae of cerebral infarction: Secondary | ICD-10-CM

## 2020-08-21 DIAGNOSIS — R609 Edema, unspecified: Secondary | ICD-10-CM

## 2020-08-21 DIAGNOSIS — K219 Gastro-esophageal reflux disease without esophagitis: Secondary | ICD-10-CM

## 2020-08-21 DIAGNOSIS — Z794 Long term (current) use of insulin: Secondary | ICD-10-CM | POA: Diagnosis not present

## 2020-08-21 DIAGNOSIS — F411 Generalized anxiety disorder: Secondary | ICD-10-CM

## 2020-08-21 DIAGNOSIS — I10 Essential (primary) hypertension: Secondary | ICD-10-CM | POA: Diagnosis not present

## 2020-08-21 DIAGNOSIS — N1831 Chronic kidney disease, stage 3a: Secondary | ICD-10-CM

## 2020-08-21 LAB — CMP14+EGFR
ALT: 7 IU/L (ref 0–32)
AST: 9 IU/L (ref 0–40)
Albumin/Globulin Ratio: 1.5 (ref 1.2–2.2)
Albumin: 4.1 g/dL (ref 3.6–4.6)
Alkaline Phosphatase: 68 IU/L (ref 44–121)
BUN/Creatinine Ratio: 18 (ref 12–28)
BUN: 20 mg/dL (ref 8–27)
Bilirubin Total: 0.5 mg/dL (ref 0.0–1.2)
CO2: 22 mmol/L (ref 20–29)
Calcium: 9.9 mg/dL (ref 8.7–10.3)
Chloride: 102 mmol/L (ref 96–106)
Creatinine, Ser: 1.14 mg/dL — ABNORMAL HIGH (ref 0.57–1.00)
GFR calc Af Amer: 51 mL/min/{1.73_m2} — ABNORMAL LOW (ref >59)
GFR calc non Af Amer: 44 mL/min/{1.73_m2} — ABNORMAL LOW (ref >59)
Globulin, Total: 2.8 g/dL (ref 1.5–4.5)
Glucose: 269 mg/dL — ABNORMAL HIGH (ref 65–99)
Potassium: 4.5 mmol/L (ref 3.5–5.2)
Sodium: 137 mmol/L (ref 134–144)
Total Protein: 6.9 g/dL (ref 6.0–8.5)

## 2020-08-21 LAB — CBC WITH DIFFERENTIAL/PLATELET
Basophils Absolute: 0.1 10*3/uL (ref 0.0–0.2)
Basos: 1 %
EOS (ABSOLUTE): 0.2 10*3/uL (ref 0.0–0.4)
Eos: 3 %
Hematocrit: 35.8 % (ref 34.0–46.6)
Hemoglobin: 11.8 g/dL (ref 11.1–15.9)
Immature Grans (Abs): 0 10*3/uL (ref 0.0–0.1)
Immature Granulocytes: 0 %
Lymphocytes Absolute: 0.9 10*3/uL (ref 0.7–3.1)
Lymphs: 13 %
MCH: 30.7 pg (ref 26.6–33.0)
MCHC: 33 g/dL (ref 31.5–35.7)
MCV: 93 fL (ref 79–97)
Monocytes Absolute: 0.6 10*3/uL (ref 0.1–0.9)
Monocytes: 8 %
Neutrophils Absolute: 5.5 10*3/uL (ref 1.4–7.0)
Neutrophils: 75 %
Platelets: 183 10*3/uL (ref 150–450)
RBC: 3.84 x10E6/uL (ref 3.77–5.28)
RDW: 12.8 % (ref 11.7–15.4)
WBC: 7.2 10*3/uL (ref 3.4–10.8)

## 2020-08-21 LAB — LIPID PANEL
Chol/HDL Ratio: 2.8 ratio (ref 0.0–4.4)
Cholesterol, Total: 107 mg/dL (ref 100–199)
HDL: 38 mg/dL — ABNORMAL LOW (ref >39)
LDL Chol Calc (NIH): 45 mg/dL (ref 0–99)
Triglycerides: 140 mg/dL (ref 0–149)
VLDL Cholesterol Cal: 24 mg/dL (ref 5–40)

## 2020-08-21 LAB — BAYER DCA HB A1C WAIVED: HB A1C (BAYER DCA - WAIVED): 6.9 % (ref <7.0)

## 2020-08-21 MED ORDER — LORAZEPAM 0.5 MG PO TABS: 0.5000 mg | ORAL_TABLET | Freq: Two times a day (BID) | ORAL | 3 refills | Status: DC

## 2020-08-21 NOTE — Progress Notes (Signed)
Subjective:    Patient ID: Kara Dean, female    DOB: 02/15/1933, 86 y.o.   MRN: [id number]   Chief Complaint: Medical Management of Chronic Issues    HPI:  1. Type 2 diabetes mellitus treated with insulin (HCC) Fasting blood sugars have been up and down. Kara Dean skips Kara Dean lantus some days if Kara Dean gets up late. Lab Results  Component Value Date   HGBA1C 7.0 (H) 05/05/2020     2. Essential hypertension No c/o chest pain, sob or headache. Does  check blood pressure at home and is usually above 725 systolic but does go below that at times. BP Readings from Last 3 Encounters:  05/05/20 (!) 140/54  03/27/20 138/60  02/14/20 (!) 148/57     3. Hyperlipidemia, unspecified hyperlipidemia type Does not watch diet and does no exercise. Lab Results  Component Value Date   CHOL 113 05/05/2020   HDL 44 05/05/2020   LDLCALC 44 05/05/2020   TRIG 146 05/05/2020   CHOLHDL 2.6 05/05/2020     4. Gastroesophageal reflux disease without esophagitis Is on protoinix daily and works well t keep symptoms under control  5. Chronic kidney disease (CKD) stage G3a/A3, moderately decreased glomerular filtration rate (GFR) between 45-59 mL/min/1.73 square meter and albuminuria creatinine ratio greater than 300 mg/g (HCC) No problems voiding Lab Results  Component Value Date   CREATININE 1.40 (H) 05/05/2020     6. Late effect of cerebrovascular accident (CVA) Has no residule effects at this time  7. Peripheral edema Has edema daily by the end of the day.  8. Recurrent major depressive disorder, in full remission [facility]) Kara Dean is currently on celexa and is doing well. Depression screen [facility] 2/9 08/21/2020 05/05/2020 03/03/2020  Decreased Interest 0 0 1  Down, Depressed, Hopeless 0 0 1  PHQ - 2 Score 0 0 2  Altered sleeping - - 1  Tired, decreased energy - - 1  Change in appetite - - 1  Feeling bad or failure about yourself  - - 0  Trouble concentrating - - 0  Moving slowly or  fidgety/restless - - 1  Suicidal thoughts - - 0  PHQ-9 Score - - 6  Difficult doing work/chores - - Somewhat difficult  Some recent data might be hidden     9. GAD (generalized anxiety disorder) Takes klonopin bid to keep Kara Dean from worrying so much.  GAD 7 : Generalized Anxiety Score 08/21/2020 03/03/2020 02/01/2020  Nervous, Anxious, on Edge _0 Control/stop worrying _1 Worry too much - different things _2 Trouble relaxing 0 1 0  Restless 0 0 0  Easily annoyed or irritable 0 0 1  Afraid - awful might happen 0 0 1  Total GAD 7 Score _3 Anxiety Difficulty Somewhat difficult Somewhat difficult Not difficult at all      Outpatient Encounter Medications as of 08/21/2020  Medication Sig  . amLODipine (NORVASC) 10 MG tablet Take 1 tablet (10 mg total) by mouth daily.  Marland Kitchen atorvastatin (LIPITOR) 10 MG tablet Take 1 tablet (10 mg total) by mouth daily.  . B Complex Vitamins (B COMPLEX-B12) TABS Take 1 tablet by mouth daily.    . Cholecalciferol (VITAMIN D3 PO) Take 1 capsule daily by mouth.  . citalopram (CELEXA) 20 MG tablet Take 1 tablet (20 mg total) by mouth daily.  Marland Kitchen dipyridamole-aspirin (AGGRENOX) 200-25 MG 12hr capsule TAKE  (1)  CAPSULE  TWICE DAILY.  . fluticasone (  FLONASE) 50 MCG/ACT nasal spray USE 2 SPRAYS IN EACH NOSTRIL ONCE DAILY.  . furosemide (LASIX) 20 MG tablet Take 1 tablet (20 mg total) by mouth daily.  . insulin glargine (LANTUS) 100 UNIT/ML injection Inject 0.48 mLs (48 Units total) into the skin every morning.  Marland Kitchen LORazepam (ATIVAN) 0.5 MG tablet TAKE (1) TABLET TWICE A DAY.  . Magnesium 250 MG TABS Take 250 mg daily by mouth.   . metoprolol succinate (TOPROL-XL) 100 MG 24 hr tablet Take with or immediately following a meal.  . olmesartan (BENICAR) 40 MG tablet Take 1 tablet (40 mg total) by mouth daily.  . Omega-3 Fatty Acids (FISH OIL) 1000 MG CAPS Take 1 capsule by mouth daily.  Glory Rosebush ULTRA test strip CHECK BLOOD SUGER UP TO 3 TIMES A DAY  .  pantoprazole (PROTONIX) 40 MG tablet Take 1 tablet (40 mg total) by mouth daily.  . potassium chloride (KLOR-CON) 10 MEQ tablet Take 1 tablet (10 mEq total) by mouth daily.   No facility-administered encounter medications on file as of 08/21/2020.    Past Surgical History:  Procedure Laterality Date  . ABDOMINAL HYSTERECTOMY    . BACK SURGERY  1994  . CATARACT EXTRACTION W/PHACO Left 09/19/2017   Procedure: CATARACT EXTRACTION PHACO AND INTRAOCULAR LENS PLACEMENT (IOC);  Surgeon: Baruch Goldmann, MD;  Location: [facility];  Service: Ophthalmology;  Laterality: Left;  CDE: 8.10  . CATARACT EXTRACTION W/PHACO Right 10/10/2017   Procedure: CATARACT EXTRACTION PHACO AND INTRAOCULAR LENS PLACEMENT RIGHT EYE;  Surgeon: Baruch Goldmann, MD;  Location: [facility];  Service: Ophthalmology;  Laterality: Right;  CDE: 9.60  . COLON RESECTION  1990's  . TUBAL LIGATION    . VESICOVAGINAL FISTULA CLOSURE W/ TAH  1984    Family History  Problem Relation Age of Onset  . Coronary artery disease Sister        stent x 3  . Seizures Sister   . Coronary artery disease Brother        stent  . Hypertension Mother   . Stroke Mother   . Emphysema Father   . Alcohol abuse Brother   . Lung disease Brother   . Heart attack Daughter     New complaints: None today  Social history: Kara Dean grand daughter stays with Kara Dean sometimes  Controlled substance contract: 08/21/20    Review of Systems  Constitutional: Negative for diaphoresis.  Eyes: Negative for pain.  Respiratory: Negative for shortness of breath.   Cardiovascular: Negative for chest pain, palpitations and leg swelling.  Gastrointestinal: Negative for abdominal pain.  Endocrine: Negative for polydipsia.  Skin: Negative for rash.  Neurological: Negative for dizziness, weakness and headaches.  Hematological: Does not bruise/bleed easily.  All other systems reviewed and are negative.      Objective:   Physical Exam Vitals and nursing note reviewed.    Constitutional:      General: Kara Dean is not in acute distress.    Appearance: Normal appearance. Kara Dean is well-developed.  HENT:     Head: Normocephalic.     Nose: Nose normal.  Eyes:     Pupils: Pupils are equal, round, and reactive to light.  Neck:     Vascular: No carotid bruit or JVD.  Cardiovascular:     Rate and Rhythm: Normal rate and regular rhythm.     Heart sounds: Murmur (2/6 systolic) heard.   Pulmonary:     Effort: Pulmonary effort is normal. No respiratory distress.     Breath sounds: Normal  breath sounds. No wheezing or rales.  Chest:     Chest wall: No tenderness.  Abdominal:     General: Bowel sounds are normal. There is no distension or abdominal bruit.     Palpations: Abdomen is soft. There is no hepatomegaly, splenomegaly, mass or pulsatile mass.     Tenderness: There is no abdominal tenderness.  Musculoskeletal:        General: Normal range of motion.     Cervical back: Normal range of motion and neck supple.     Right lower leg: Edema (1+) present.     Left lower leg: Edema (1+) present.  Lymphadenopathy:     Cervical: No cervical adenopathy.  Skin:    General: Skin is warm and dry.  Neurological:     Mental Status: Kara Dean is alert and oriented to person, place, and time.     Deep Tendon Reflexes: Reflexes are normal and symmetric.  Psychiatric:        Behavior: Behavior normal.        Thought Content: Thought content normal.        Judgment: Judgment normal.    BP (!) 158/49   Pulse (!) 50   Temp 98.7 F (37.1 C) (Oral)   Ht 4' 11" (1.499 m)   Wt 125 lb 12.8 oz (57.1 kg)   BMI 25.41 kg/m   hgba1c 6.9%     Assessment & Plan:  Kara Dean comes in today with chief complaint of Medical Management of Chronic Issues   Diagnosis and orders addressed:  1. Type 2 diabetes mellitus treated with insulin (HCC) Continue to watch carbs in diet No medication changes today - Bayer DCA Hb A1c Waived - Microalbumin / creatinine urine ratio  2.  Essential hypertension Low sodium diet - CBC with Differential/Platelet - CMP14+EGFR  3. Hyperlipidemia, unspecified hyperlipidemia type Low fat diet - Lipid panel  4. Gastroesophageal reflux disease without esophagitis Avoid spicy foods Do not eat 2 hours prior to bedtime  5. Chronic kidney disease (CKD) stage G3a/A3, moderately decreased glomerular filtration rate (GFR) between 45-59 mL/min/1.73 square meter and albuminuria creatinine ratio greater than 300 mg/g (HCC) Labs pending  6. Late effect of cerebrovascular accident (CVA) Fall prevention discussed  7. Peripheral edema Elevate legs when sitting  8. Recurrent major depressive disorder, in full remission ([place]) Continue current meds  9. GAD (generalized anxiety disorder) Stress management   Labs pending Health Maintenance reviewed Diet and exercise encouraged  Follow up plan: 3 months   [name], FNP

## 2020-08-21 NOTE — Patient Instructions (Signed)

## 2020-08-22 LAB — MICROALBUMIN / CREATININE URINE RATIO
Creatinine, Urine: 65.9 mg/dL
Microalb/Creat Ratio: 180 mg/g creat — ABNORMAL HIGH (ref 0–29)
Microalbumin, Urine: 118.9 ug/mL

## 2020-09-06 ENCOUNTER — Other Ambulatory Visit: Payer: Self-pay | Admitting: Nurse Practitioner

## 2020-09-14 DIAGNOSIS — L84 Corns and callosities: Secondary | ICD-10-CM | POA: Diagnosis not present

## 2020-09-14 DIAGNOSIS — B351 Tinea unguium: Secondary | ICD-10-CM | POA: Diagnosis not present

## 2020-09-14 DIAGNOSIS — M79676 Pain in unspecified toe(s): Secondary | ICD-10-CM | POA: Diagnosis not present

## 2020-09-14 DIAGNOSIS — E1142 Type 2 diabetes mellitus with diabetic polyneuropathy: Secondary | ICD-10-CM | POA: Diagnosis not present

## 2020-09-19 ENCOUNTER — Telehealth: Payer: Medicare Other

## 2020-09-26 DIAGNOSIS — R001 Bradycardia, unspecified: Secondary | ICD-10-CM | POA: Diagnosis not present

## 2020-09-26 DIAGNOSIS — I959 Hypotension, unspecified: Secondary | ICD-10-CM | POA: Diagnosis not present

## 2020-10-06 ENCOUNTER — Telehealth: Payer: Self-pay

## 2020-10-06 NOTE — Telephone Encounter (Signed)
FYI: Pt called stating that she received a missed call from [facility] about being overdue for flu shot.   Pt got her flu shot this year at [facility].

## 2020-10-13 ENCOUNTER — Other Ambulatory Visit: Payer: Self-pay | Admitting: Nurse Practitioner

## 2020-10-14 DIAGNOSIS — R42 Dizziness and giddiness: Secondary | ICD-10-CM | POA: Diagnosis not present

## 2020-10-14 DIAGNOSIS — I959 Hypotension, unspecified: Secondary | ICD-10-CM | POA: Diagnosis not present

## 2020-10-14 DIAGNOSIS — R55 Syncope and collapse: Secondary | ICD-10-CM | POA: Diagnosis not present

## 2020-10-25 ENCOUNTER — Ambulatory Visit: Payer: Medicare Other | Admitting: Licensed Clinical Social Worker

## 2020-10-25 DIAGNOSIS — N1831 Chronic kidney disease, stage 3a: Secondary | ICD-10-CM

## 2020-10-25 DIAGNOSIS — F411 Generalized anxiety disorder: Secondary | ICD-10-CM

## 2020-10-25 DIAGNOSIS — E119 Type 2 diabetes mellitus without complications: Secondary | ICD-10-CM

## 2020-10-25 DIAGNOSIS — Z794 Long term (current) use of insulin: Secondary | ICD-10-CM

## 2020-10-25 DIAGNOSIS — K219 Gastro-esophageal reflux disease without esophagitis: Secondary | ICD-10-CM

## 2020-10-25 DIAGNOSIS — I10 Essential (primary) hypertension: Secondary | ICD-10-CM

## 2020-10-25 DIAGNOSIS — I6930 Unspecified sequelae of cerebral infarction: Secondary | ICD-10-CM
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
## 2020-11-21 ENCOUNTER — Ambulatory Visit: Payer: Self-pay | Admitting: Nurse Practitioner

## 2020-11-28 ENCOUNTER — Telehealth: Payer: Medicare Other

## 2021-01-02 ENCOUNTER — Ambulatory Visit: Payer: Medicare Other | Admitting: Licensed Clinical Social Worker

## 2021-01-02 DIAGNOSIS — K219 Gastro-esophageal reflux disease without esophagitis: Secondary | ICD-10-CM

## 2021-01-02 DIAGNOSIS — E785 Hyperlipidemia, unspecified: Secondary | ICD-10-CM

## 2021-01-02 DIAGNOSIS — Z794 Long term (current) use of insulin: Secondary | ICD-10-CM

## 2021-01-02 DIAGNOSIS — N1831 Chronic kidney disease, stage 3a: Secondary | ICD-10-CM

## 2021-01-02 DIAGNOSIS — F411 Generalized anxiety disorder: Secondary | ICD-10-CM

## 2021-01-02 DIAGNOSIS — F3342 Major depressive disorder, recurrent, in full remission: Secondary | ICD-10-CM

## 2021-01-02 DIAGNOSIS — I10 Essential (primary) hypertension: Secondary | ICD-10-CM

## 2021-01-02 DIAGNOSIS — E119 Type 2 diabetes mellitus without complications: Secondary | ICD-10-CM

## 2021-01-02 DIAGNOSIS — I6930 Unspecified sequelae of cerebral infarction: Secondary | ICD-10-CM

## 2021-01-02 NOTE — Patient Instructions (Addendum)
Licensed Clinical Social Worker Visit Information  Materials Provided: No  01/02/2021  Name: Kara Dean          MRN: [id number]       DOB: [birth year]  Kara Dean is a 87 y.o. year old female who is a primary care patient of Kara Dean, [place]. The CCM team was consulted for assistance with Kara Dean .   Review of patient status, including review of consultants reports, other relevant assessments, and collaboration with appropriate care team members and the patient's provider was performed as part of comprehensive patient evaluation and provision of chronic care management services.    SDOH (Social Determinants of Health) assessments performed: No; risk for depression; risk for social isolation; risk for stress; risk for physical inactivity  LCSW called client home and cell numbers several times today but LCSW was not able to speak via phone with client today; phone message was not set up for client and thus, LCSW could not leave phone message for client today.  LCSW also called phone number for Kara Dean daughter of client. LCSW was not able to speak with Kara Dean via phone today; phone message was not set up on Kara Dean's phone; thus, LCSW could not leave phone message for Kara Dean today  Follow Up Plan: LCSW to call client or her daughter in the next 4 weeks to talk with client/daughter about anxiety or depression issues of client  LCSW was not able to speak via phone today with client or with daughter of client; thus, the patient or her daughter were not able to  verbalize understanding of instructions provided today and were not able to accept or decline a print copy of patient instruction materials.   Kara Dean.Kara Dean MSW, LCSW Licensed Clinical Social Worker [facility] Care Management [phone]

## 2021-01-02 NOTE — Chronic Care Management (AMB) (Signed)
Chronic Care Management    Clinical Social Work Follow Up Note  01/02/2021 Name: HANAAN GANCARZ MRN: [id number] DOB: Aug 27, 1933  Ellene Route Forgione is a 87 y.o. year old female who is a primary care patient of [name], [place]. The CCM team was consulted for assistance with [employer] .   Review of patient status, including review of consultants reports, other relevant assessments, and collaboration with appropriate care team members and the patient's provider was performed as part of comprehensive patient evaluation and provision of chronic care management services.    SDOH (Social Determinants of Health) assessments performed: No; risk for depression; risk for social isolation; risk for stress; risk for physical inactivity  Flowsheet Row Chronic Care Management from 03/03/2020 in [place]  PHQ-9 Total Score 6      GAD 7 : Generalized Anxiety Score 08/21/2020 03/03/2020 02/01/2020  Nervous, Anxious, on Edge 1 1 1   Control/stop worrying 2 1 1   Worry too much - different things 2 1 1   Trouble relaxing 0 1 0  Restless 0 0 0  Easily annoyed or irritable 0 0 1  Afraid - awful might happen 0 0 1  Total GAD 7 Score 5 4 5   Anxiety Difficulty Somewhat difficult Somewhat difficult Not difficult at all    Outpatient Encounter Medications as of 01/02/2021  Medication Sig  . amLODipine (NORVASC) 10 MG tablet Take 1 tablet (10 mg total) by mouth daily.  Marland Kitchen atorvastatin (LIPITOR) 10 MG tablet Take 1 tablet (10 mg total) by mouth daily.  . B Complex Vitamins (B COMPLEX-B12) TABS Take 1 tablet by mouth daily.    . Cholecalciferol (VITAMIN D3 PO) Take 1 capsule daily by mouth.  . citalopram (CELEXA) 20 MG tablet Take 1 tablet (20 mg total) by mouth daily.  Marland Kitchen dipyridamole-aspirin (AGGRENOX) 200-25 MG 12hr capsule TAKE  (1)  CAPSULE  TWICE DAILY.  . fluticasone (FLONASE) 50 MCG/ACT nasal spray USE 2 SPRAYS IN EACH NOSTRIL ONCE DAILY.  . furosemide (LASIX) 20 MG  tablet Take 1 tablet (20 mg total) by mouth daily.  Marland Kitchen glucose blood (ONETOUCH ULTRA) test strip Check BS up to 3 times daily Dx E11.9  . insulin glargine (LANTUS) 100 UNIT/ML injection Inject 0.48 mLs (48 Units total) into the skin every morning.  Marland Kitchen LORazepam (ATIVAN) 0.5 MG tablet Take 1 tablet (0.5 mg total) by mouth 2 (two) times daily.  . Magnesium 250 MG TABS Take 250 mg daily by mouth.   . metoprolol succinate (TOPROL-XL) 100 MG 24 hr tablet Take with or immediately following a meal.  . olmesartan (BENICAR) 40 MG tablet Take 1 tablet (40 mg total) by mouth daily.  . Omega-3 Fatty Acids (FISH OIL) 1000 MG CAPS Take 1 capsule by mouth daily.  . pantoprazole (PROTONIX) 40 MG tablet Take 1 tablet (40 mg total) by mouth daily.  . potassium chloride (KLOR-CON) 10 MEQ tablet Take 1 tablet (10 mEq total) by mouth daily.   No facility-administered encounter medications on file as of 01/02/2021.    LCSW called client home and cell numbers several times today but LCSW was not able to speak via phone with client today; phone message was not set up for client and thus, LCSW could not leave phone message for client today.  LCSW also called phone number for [name] daughter of client. LCSW was not able to speak with [name] via phone today; phone message was not set up on [name]'s phone; thus, LCSW could not  leave phone message for [name] today  Follow Up Plan:  LCSW to call client or her daughter in the next 4 weeks to talk with client/daughter about anxiety or depression issues of client  Norva Riffle. MSW, LCSW Licensed Clinical Social Worker [facility] Care Management [phone]

## 2021-01-11 ENCOUNTER — Other Ambulatory Visit: Payer: Self-pay | Admitting: Nurse Practitioner

## 2021-01-11 DIAGNOSIS — K219 Gastro-esophageal reflux disease without esophagitis: Secondary | ICD-10-CM

## 2021-01-11 DIAGNOSIS — B351 Tinea unguium: Secondary | ICD-10-CM | POA: Diagnosis not present

## 2021-01-11 DIAGNOSIS — M79676 Pain in unspecified toe(s): Secondary | ICD-10-CM | POA: Diagnosis not present

## 2021-01-11 DIAGNOSIS — E7800 Pure hypercholesterolemia, unspecified: Secondary | ICD-10-CM

## 2021-01-11 DIAGNOSIS — I2510 Atherosclerotic heart disease of native coronary artery without angina pectoris: Secondary | ICD-10-CM

## 2021-01-11 DIAGNOSIS — R609 Edema, unspecified: Secondary | ICD-10-CM

## 2021-01-11 DIAGNOSIS — I10 Essential (primary) hypertension: Secondary | ICD-10-CM

## 2021-01-11 DIAGNOSIS — E1142 Type 2 diabetes mellitus with diabetic polyneuropathy: Secondary | ICD-10-CM | POA: Diagnosis not present

## 2021-01-11 DIAGNOSIS — L84 Corns and callosities: Secondary | ICD-10-CM | POA: Diagnosis not present

## 2021-01-12 ENCOUNTER — Other Ambulatory Visit: Payer: Self-pay | Admitting: Nurse Practitioner

## 2021-01-12 DIAGNOSIS — F3342 Major depressive disorder, recurrent, in full remission: Secondary | ICD-10-CM

## 2021-01-15 DIAGNOSIS — Z961 Presence of intraocular lens: Secondary | ICD-10-CM | POA: Diagnosis not present

## 2021-01-15 DIAGNOSIS — H26493 Other secondary cataract, bilateral: Secondary | ICD-10-CM | POA: Diagnosis not present

## 2021-01-15 LAB — HM DIABETES EYE EXAM

## 2021-01-16 ENCOUNTER — Other Ambulatory Visit: Payer: Self-pay | Admitting: Nurse Practitioner

## 2021-01-16 DIAGNOSIS — F411 Generalized anxiety disorder: Secondary | ICD-10-CM

## 2021-01-24 ENCOUNTER — Other Ambulatory Visit: Payer: Self-pay | Admitting: Nurse Practitioner

## 2021-01-24 DIAGNOSIS — F411 Generalized anxiety disorder: Secondary | ICD-10-CM

## 2021-01-25 ENCOUNTER — Encounter: Payer: Self-pay | Admitting: Nurse Practitioner

## 2021-01-25 ENCOUNTER — Other Ambulatory Visit: Payer: Self-pay | Admitting: Nurse Practitioner

## 2021-01-25 ENCOUNTER — Other Ambulatory Visit: Payer: Self-pay

## 2021-01-25 ENCOUNTER — Ambulatory Visit (INDEPENDENT_AMBULATORY_CARE_PROVIDER_SITE_OTHER): Payer: Medicare Other | Admitting: Nurse Practitioner

## 2021-01-25 VITALS — BP 148/53 | HR 45 | Temp 97.2°F | Resp 20 | Ht 59.0 in | Wt 123.0 lb

## 2021-01-25 DIAGNOSIS — I6930 Unspecified sequelae of cerebral infarction: Secondary | ICD-10-CM | POA: Diagnosis not present

## 2021-01-25 DIAGNOSIS — E785 Hyperlipidemia, unspecified: Secondary | ICD-10-CM | POA: Diagnosis not present

## 2021-01-25 DIAGNOSIS — F3342 Major depressive disorder, recurrent, in full remission: Secondary | ICD-10-CM

## 2021-01-25 DIAGNOSIS — F411 Generalized anxiety disorder: Secondary | ICD-10-CM

## 2021-01-25 DIAGNOSIS — Z794 Long term (current) use of insulin: Secondary | ICD-10-CM

## 2021-01-25 DIAGNOSIS — E119 Type 2 diabetes mellitus without complications: Secondary | ICD-10-CM

## 2021-01-25 DIAGNOSIS — R609 Edema, unspecified: Secondary | ICD-10-CM | POA: Diagnosis not present

## 2021-01-25 DIAGNOSIS — I2510 Atherosclerotic heart disease of native coronary artery without angina pectoris: Secondary | ICD-10-CM

## 2021-01-25 DIAGNOSIS — N1831 Chronic kidney disease, stage 3a: Secondary | ICD-10-CM

## 2021-01-25 DIAGNOSIS — K219 Gastro-esophageal reflux disease without esophagitis: Secondary | ICD-10-CM

## 2021-01-25 DIAGNOSIS — E7800 Pure hypercholesterolemia, unspecified: Secondary | ICD-10-CM | POA: Diagnosis not present

## 2021-01-25 DIAGNOSIS — I10 Essential (primary) hypertension: Secondary | ICD-10-CM | POA: Diagnosis not present

## 2021-01-25 LAB — BAYER DCA HB A1C WAIVED: HB A1C (BAYER DCA - WAIVED): 7.2 % — ABNORMAL HIGH (ref <7.0)

## 2021-01-25 MED ORDER — PANTOPRAZOLE SODIUM 40 MG PO TBEC
40.0000 mg | DELAYED_RELEASE_TABLET | Freq: Every day | ORAL | 0 refills | Status: DC
Start: 2021-01-25 — End: 2021-05-01

## 2021-01-25 MED ORDER — ATORVASTATIN CALCIUM 10 MG PO TABS: 10.0000 mg | ORAL_TABLET | Freq: Every day | ORAL | 0 refills | Status: DC

## 2021-01-25 MED ORDER — INSULIN GLARGINE 100 UNIT/ML ~~LOC~~ SOLN
48.0000 [IU] | Freq: Every morning | SUBCUTANEOUS | 5 refills | Status: DC
Start: 2021-01-25 — End: 2021-04-03

## 2021-01-25 MED ORDER — OLMESARTAN MEDOXOMIL 40 MG PO TABS
40.0000 mg | ORAL_TABLET | Freq: Every day | ORAL | 0 refills | Status: DC
Start: 2021-01-25 — End: 2021-02-13

## 2021-01-25 MED ORDER — FUROSEMIDE 20 MG PO TABS: 20.0000 mg | ORAL_TABLET | Freq: Every day | ORAL | 0 refills | Status: DC

## 2021-01-25 MED ORDER — AMLODIPINE BESYLATE 10 MG PO TABS: 10.0000 mg | ORAL_TABLET | Freq: Every day | ORAL | 0 refills | Status: DC

## 2021-01-25 MED ORDER — POTASSIUM CHLORIDE ER 10 MEQ PO TBCR: 10.0000 meq | EXTENDED_RELEASE_TABLET | Freq: Every day | ORAL | 0 refills | Status: DC

## 2021-01-25 MED ORDER — CITALOPRAM HYDROBROMIDE 20 MG PO TABS: 20.0000 mg | ORAL_TABLET | Freq: Every day | ORAL | 0 refills | Status: DC

## 2021-01-25 MED ORDER — ASPIRIN-DIPYRIDAMOLE ER 25-200 MG PO CP12
ORAL_CAPSULE | ORAL | 1 refills | Status: DC
Start: 2021-01-25 — End: 2021-05-01

## 2021-01-25 MED ORDER — METOPROLOL SUCCINATE ER 100 MG PO TB24
ORAL_TABLET | ORAL | 0 refills | Status: DC
Start: 2021-01-25 — End: 2021-02-13

## 2021-01-25 MED ORDER — LORAZEPAM 0.5 MG PO TABS: 0.5000 mg | ORAL_TABLET | Freq: Two times a day (BID) | ORAL | 3 refills | Status: DC

## 2021-01-25 NOTE — Progress Notes (Signed)
Subjective:    Patient ID: Kara Dean, female    DOB: 01-28-1933, 87 y.o.   MRN: [id number]   Chief Complaint: medical management of chronic issues     HPI:  1. Essential hypertension No c/o chest pain, sob or headache. Does not check blood pressure at ome. BP Readings from Last 3 Encounters:  01/25/21 (!) 148/53  08/21/20 (!) 158/49  05/05/20 (!) 140/54      2. Hyperlipidemia, unspecified hyperlipidemia type Eats whatever her family brings her to eat. Does no exercise. Lab Results  Component Value Date   CHOL 107 08/21/2020   HDL 38 (L) 08/21/2020   LDLCALC 45 08/21/2020   TRIG 140 08/21/2020   CHOLHDL 2.8 08/21/2020     3. Type 2 diabetes mellitus treated with insulin ([place]) She checks blood sugars daily. She says they have been running oveer 200 lately. She has not been watching her diet at all. Lab Results  Component Value Date   HGBA1C 6.9 08/21/2020    4. Gastroesophageal reflux disease without esophagitis Takes protonix daily. Doing well. No symptoms when she takes meds.  5. Late effect of cerebrovascular accident (CVA) No residual effects from stroke.  6. Peripheral edema Is on lasix daily which keeps her swelling down.  7. GAD (generalized anxiety disorder) Takes ativan bid. Gest a little nervous when she does not take. GAD 7 : Generalized Anxiety Score 01/25/2021 08/21/2020 03/03/2020 02/01/2020  Nervous, Anxious, on Edge _0 Control/stop worrying _1 Worry too much - different things _2 Trouble relaxing 1 0 1 0  Restless 0 0 0 0  Easily annoyed or irritable 0 0 0 1  Afraid - awful might happen 0 0 0 1  Total GAD 7 Score _3 Anxiety Difficulty Not difficult at all Somewhat difficult Somewhat difficult Not difficult at all      8. Recurrent major depressive disorder, in full remission ([place]) Is on celexa daily and says her depression is good. Depression screen [facility] 2/9 01/25/2021 08/21/2020 05/05/2020  Decreased Interest 3  0 0  Down, Depressed, Hopeless 3 0 0  PHQ - 2 Score 6 0 0  Altered sleeping 0 - -  Tired, decreased energy 1 - -  Change in appetite 1 - -  Feeling bad or failure about yourself  1 - -  Trouble concentrating 1 - -  Moving slowly or fidgety/restless 0 - -  Suicidal thoughts 0 - -  PHQ-9 Score 10 - -  Difficult doing work/chores Not difficult at all - -  Some recent data might be hidden     9. Chronic kidney disease (CKD) stage G3a/A3, moderately decreased glomerular filtration rate (GFR) between 45-59 mL/min/1.73 square meter and albuminuria creatinine ratio greater than 300 mg/g (HCC) Denies any problems voiding Lab Results  Component Value Date   CREATININE 1.14 (H) 08/21/2020       Outpatient Encounter Medications as of 01/25/2021  Medication Sig  . amLODipine (NORVASC) 10 MG tablet Take 1 tablet (10 mg total) by mouth daily. (NEEDS TO BE SEEN BEFORE NEXT REFILL)  . atorvastatin (LIPITOR) 10 MG tablet Take 1 tablet (10 mg total) by mouth daily. (NEEDS TO BE SEEN BEFORE NEXT REFILL)  . B Complex Vitamins (B COMPLEX-B12) TABS Take 1 tablet by mouth daily.    . Cholecalciferol (VITAMIN D3 PO) Take 1 capsule daily by mouth.  . citalopram (CELEXA) 20 MG tablet Take  1 tablet (20 mg total) by mouth daily. (NEEDS TO BE SEEN BEFORE NEXT REFILL)  . dipyridamole-aspirin (AGGRENOX) 200-25 MG 12hr capsule TAKE  (1)  CAPSULE  TWICE DAILY.  . fluticasone (FLONASE) 50 MCG/ACT nasal spray USE 2 SPRAYS IN EACH NOSTRIL ONCE DAILY.  . furosemide (LASIX) 20 MG tablet Take 1 tablet (20 mg total) by mouth daily. (NEEDS TO BE SEEN BEFORE NEXT REFILL)  . glucose blood (ONETOUCH ULTRA) test strip Check BS up to 3 times daily Dx E11.9  . insulin glargine (LANTUS) 100 UNIT/ML injection Inject 0.48 mLs (48 Units total) into the skin every morning.  Marland Kitchen LORazepam (ATIVAN) 0.5 MG tablet Take 1 tablet (0.5 mg total) by mouth 2 (two) times daily.  . Magnesium 250 MG TABS Take 250 mg daily by mouth.   .  metoprolol succinate (TOPROL-XL) 100 MG 24 hr tablet TAKE 1 TABLET ONCE DAILY WITH A MEAL (NEEDS TO BE SEEN BEFORE NEXT REFILL)  . olmesartan (BENICAR) 40 MG tablet Take 1 tablet (40 mg total) by mouth daily. (NEEDS TO BE SEEN BEFORE NEXT REFILL)  . Omega-3 Fatty Acids (FISH OIL) 1000 MG CAPS Take 1 capsule by mouth daily.  . pantoprazole (PROTONIX) 40 MG tablet Take 1 tablet (40 mg total) by mouth daily. (NEEDS TO BE SEEN BEFORE NEXT REFILL)  . potassium chloride (KLOR-CON) 10 MEQ tablet Take 1 tablet (10 mEq total) by mouth daily. (NEEDS TO BE SEEN BEFORE NEXT REFILL)   No facility-administered encounter medications on file as of 01/25/2021.    Past Surgical History:  Procedure Laterality Date  . ABDOMINAL HYSTERECTOMY    . BACK SURGERY  1994  . CATARACT EXTRACTION W/PHACO Left 09/19/2017   Procedure: CATARACT EXTRACTION PHACO AND INTRAOCULAR LENS PLACEMENT (IOC);  Surgeon: Baruch Goldmann, MD;  Location: [facility];  Service: Ophthalmology;  Laterality: Left;  CDE: 8.10  . CATARACT EXTRACTION W/PHACO Right 10/10/2017   Procedure: CATARACT EXTRACTION PHACO AND INTRAOCULAR LENS PLACEMENT RIGHT EYE;  Surgeon: Baruch Goldmann, MD;  Location: [facility];  Service: Ophthalmology;  Laterality: Right;  CDE: 9.60  . COLON RESECTION  1990's  . TUBAL LIGATION    . VESICOVAGINAL FISTULA CLOSURE W/ TAH  1984    Family History  Problem Relation Age of Onset  . Coronary artery disease Sister        stent x 3  . Seizures Sister   . Coronary artery disease Brother        stent  . Hypertension Mother   . Stroke Mother   . Emphysema Father   . Alcohol abuse Brother   . Lung disease Brother   . Heart attack Daughter     New complaints: None today  Social history: Lives by herself. Trying to move out because there is mold where she is living  Controlled substance contract: n/a    Review of Systems  Constitutional: Negative for diaphoresis.  Eyes: Negative for pain.  Respiratory: Negative for  shortness of breath.   Cardiovascular: Negative for chest pain, palpitations and leg swelling.  Gastrointestinal: Negative for abdominal pain.  Endocrine: Negative for polydipsia.  Skin: Negative for rash.  Neurological: Negative for dizziness, weakness and headaches.  Hematological: Does not bruise/bleed easily.  All other systems reviewed and are negative.      Objective:   Physical Exam Vitals and nursing note reviewed.  Constitutional:      General: She is not in acute distress.    Appearance: Normal appearance. She is well-developed.  HENT:  Head: Normocephalic.     Nose: Nose normal.  Eyes:     Pupils: Pupils are equal, round, and reactive to light.  Neck:     Vascular: No carotid bruit or JVD.  Cardiovascular:     Rate and Rhythm: Normal rate. Rhythm irregular.     Heart sounds: Normal heart sounds.  Pulmonary:     Effort: Pulmonary effort is normal. No respiratory distress.     Breath sounds: Rhonchi and rales present. No wheezing.  Chest:     Chest wall: No tenderness.  Abdominal:     General: Bowel sounds are normal. There is no distension or abdominal bruit.     Palpations: Abdomen is soft. There is no hepatomegaly, splenomegaly, mass or pulsatile mass.     Tenderness: There is no abdominal tenderness.  Musculoskeletal:        General: Normal range of motion.     Cervical back: Normal range of motion and neck supple.  Lymphadenopathy:     Cervical: No cervical adenopathy.  Skin:    General: Skin is warm and dry.  Neurological:     Mental Status: She is alert and oriented to person, place, and time.     Deep Tendon Reflexes: Reflexes are normal and symmetric.  Psychiatric:        Behavior: Behavior normal.        Thought Content: Thought content normal.        Judgment: Judgment normal.    BP (!) 148/53   Pulse (!) 45   Temp (!) 97.2 F (36.2 C) (Temporal)   Resp 20   Ht 4' 11" (1.499 m)   Wt 123 lb (55.8 kg)   SpO2 94%   BMI 24.84 kg/m          Assessment & Plan:  .Kara Dean comes in today with chief complaint of Medical Management of Chronic Issues   Diagnosis and orders addressed:  1. Essential hypertension Low sodium diet - CBC with Differential/Platelet - CMP14+EGFR - olmesartan (BENICAR) 40 MG tablet; Take 1 tablet (40 mg total) by mouth daily. (NEEDS TO BE SEEN BEFORE NEXT REFILL)  Dispense: 30 tablet; Refill: 0 - amLODipine (NORVASC) 10 MG tablet; Take 1 tablet (10 mg total) by mouth daily. (NEEDS TO BE SEEN BEFORE NEXT REFILL)  Dispense: 30 tablet; Refill: 0 - potassium chloride (KLOR-CON) 10 MEQ tablet; Take 1 tablet (10 mEq total) by mouth daily. (NEEDS TO BE SEEN BEFORE NEXT REFILL)  Dispense: 30 tablet; Refill: 0  2. Hyperlipidemia, unspecified hyperlipidemia type Low fat diet - Lipid panel - atorvastatin (LIPITOR) 10 MG tablet; Take 1 tablet (10 mg total) by mouth daily. (NEEDS TO BE SEEN BEFORE NEXT REFILL)  Dispense: 30 tablet; Refill: 0   3. Type 2 diabetes mellitus treated with insulin (HCC) Continue to watch carbs in diet - Bayer DCA Hb A1c Waived - insulin glargine (LANTUS) 100 UNIT/ML injection; Inject 0.48 mLs (48 Units total) into the skin every morning.  Dispense: 30 mL; Refill: 5  4. Gastroesophageal reflux disease without esophagitis Avoid spicy foods Do not eat 2 hours prior to bedtime - pantoprazole (PROTONIX) 40 MG tablet; Take 1 tablet (40 mg total) by mouth daily. (NEEDS TO BE SEEN BEFORE NEXT REFILL)  Dispense: 30 tablet; Refill: 0  5. Late effect of cerebrovascular accident (CVA) Fall prevention  6. Peripheral edema Elevate legs when sitting - furosemide (LASIX) 20 MG tablet; Take 1 tablet (20 mg total) by mouth daily. (NEEDS TO BE SEEN  BEFORE NEXT REFILL)  Dispense: 30 tablet; Refill: 0  7. GAD (generalized anxiety disorder) Stress management - LORazepam (ATIVAN) 0.5 MG tablet; Take 1 tablet (0.5 mg total) by mouth 2 (two) times daily.  Dispense: 60 tablet; Refill:  3  8. Recurrent major depressive disorder, in full remission ([place]) - citalopram (CELEXA) 20 MG tablet; Take 1 tablet (20 mg total) by mouth daily. (NEEDS TO BE SEEN BEFORE NEXT REFILL)  Dispense: 30 tablet; Refill: 0  9. Chronic kidney disease (CKD) stage G3a/A3, moderately decreased glomerular filtration rate (GFR) between 45-59 mL/min/1.73 square meter and albuminuria creatinine ratio greater than 300 mg/g (HCC) Labs pending  10. Coronary artery disease involving native coronary artery of native heart without angina pectoris Keep follow up crdiology - metoprolol succinate (TOPROL-XL) 100 MG 24 hr tablet; TAKE 1 TABLET ONCE DAILY WITH A MEAL (NEEDS TO BE SEEN BEFORE NEXT REFILL)  Dispense: 30 tablet; Refill: 0 - dipyridamole-aspirin (AGGRENOX) 200-25 MG 12hr capsule; TAKE  (1)  CAPSULE  TWICE DAILY.  Dispense: 180 capsule; Refill: 1   Labs pending Health Maintenance reviewed Diet and exercise encouraged  Follow up plan: 3 months   [name], FNP

## 2021-01-25 NOTE — Patient Instructions (Signed)
Fall Prevention in the Home, Adult Falls can cause injuries and can happen to people of all ages. There are many things you can do to make your home safe and to help prevent falls. Ask for help when making these changes. What actions can I take to prevent falls? General Instructions  Use good lighting in all rooms. Replace any light bulbs that burn out.  Turn on the lights in dark areas. Use night-lights.  Keep items that you use often in easy-to-reach places. Lower the shelves around your home if needed.  Set up your furniture so you have a clear path. Avoid moving your furniture around.  Do not have throw rugs or other things on the floor that can make you trip.  Avoid walking on wet floors.  If any of your floors are uneven, fix them.  Add color or contrast paint or tape to clearly mark and help you see: ? Grab bars or handrails. ? First and last steps of staircases. ? Where the edge of each step is.  If you use a stepladder: ? Make sure that it is fully opened. Do not climb a closed stepladder. ? Make sure the sides of the stepladder are locked in place. ? Ask someone to hold the stepladder while you use it.  Know where your pets are when moving through your home. What can I do in the bathroom?  Keep the floor dry. Clean up any water on the floor right away.  Remove soap buildup in the tub or shower.  Use nonskid mats or decals on the floor of the tub or shower.  Attach bath mats securely with double-sided, nonslip rug tape.  If you need to sit down in the shower, use a plastic, nonslip stool.  Install grab bars by the toilet and in the tub and shower. Do not use towel bars as grab bars.      What can I do in the bedroom?  Make sure that you have a light by your bed that is easy to reach.  Do not use any sheets or blankets for your bed that hang to the floor.  Have a firm chair with side arms that you can use for support when you get dressed. What can I do in  the kitchen?  Clean up any spills right away.  If you need to reach something above you, use a step stool with a grab bar.  Keep electrical cords out of the way.  Do not use floor polish or wax that makes floors slippery. What can I do with my stairs?  Do not leave any items on the stairs.  Make sure that you have a light switch at the top and the bottom of the stairs.  Make sure that there are handrails on both sides of the stairs. Fix handrails that are broken or loose.  Install nonslip stair treads on all your stairs.  Avoid having throw rugs at the top or bottom of the stairs.  Choose a carpet that does not hide the edge of the steps on the stairs.  Check carpeting to make sure that it is firmly attached to the stairs. Fix carpet that is loose or worn. What can I do on the outside of my home?  Use bright outdoor lighting.  Fix the edges of walkways and driveways and fix any cracks.  Remove anything that might make you trip as you walk through a door, such as a raised step or threshold.  Trim any   bushes or trees on paths to your home.  Check to see if handrails are loose or broken and that both sides of all steps have handrails.  Install guardrails along the edges of any raised decks and porches.  Clear paths of anything that can make you trip, such as tools or rocks.  Have leaves, snow, or ice cleared regularly.  Use sand or salt on paths during winter.  Clean up any spills in your garage right away. This includes grease or oil spills. What other actions can I take?  Wear shoes that: ? Have a low heel. Do not wear high heels. ? Have rubber bottoms. ? Feel good on your feet and fit well. ? Are closed at the toe. Do not wear open-toe sandals.  Use tools that help you move around if needed. These include: ? Canes. ? Walkers. ? Scooters. ? Crutches.  Review your medicines with your doctor. Some medicines can make you feel dizzy. This can increase your chance  of falling. Ask your doctor what else you can do to help prevent falls. Where to find more information  Centers for Disease Control and Prevention, [employer]AD[employer]: www.cdc.gov  National Institute on Aging: www.nia.nih.gov Contact a doctor if:  You are afraid of falling at home.  You feel weak, drowsy, or dizzy at home.  You fall at home. Summary  There are many simple things that you can do to make your home safe and to help prevent falls.  Ways to make your home safe include removing things that can make you trip and installing grab bars in the bathroom.  Ask for help when making these changes in your home. This information is not intended to replace advice given to you by your health care provider. Make sure you discuss any questions you have with your health care provider. Document Revised: 05/24/2020 Document Reviewed: 05/24/2020 Elsevier Patient Education  2021 Elsevier Inc.  

## 2021-01-26 ENCOUNTER — Other Ambulatory Visit: Payer: Self-pay | Admitting: Nurse Practitioner

## 2021-01-26 DIAGNOSIS — F411 Generalized anxiety disorder: Secondary | ICD-10-CM

## 2021-01-26 LAB — LIPID PANEL
Chol/HDL Ratio: 3.2 ratio (ref 0.0–4.4)
Cholesterol, Total: 130 mg/dL (ref 100–199)
HDL: 41 mg/dL (ref >39)
LDL Chol Calc (NIH): 55 mg/dL (ref 0–99)
Triglycerides: 209 mg/dL — ABNORMAL HIGH (ref 0–149)
VLDL Cholesterol Cal: 34 mg/dL (ref 5–40)

## 2021-01-26 LAB — CBC WITH DIFFERENTIAL/PLATELET
Basophils Absolute: 0 10*3/uL (ref 0.0–0.2)
Basos: 0 %
EOS (ABSOLUTE): 0.3 10*3/uL (ref 0.0–0.4)
Eos: 4 %
Hematocrit: 37.6 % (ref 34.0–46.6)
Hemoglobin: 12.3 g/dL (ref 11.1–15.9)
Immature Grans (Abs): 0 10*3/uL (ref 0.0–0.1)
Immature Granulocytes: 0 %
Lymphocytes Absolute: 1.3 10*3/uL (ref 0.7–3.1)
Lymphs: 19 %
MCH: 30.6 pg (ref 26.6–33.0)
MCHC: 32.7 g/dL (ref 31.5–35.7)
MCV: 94 fL (ref 79–97)
Monocytes Absolute: 0.7 10*3/uL (ref 0.1–0.9)
Monocytes: 10 %
Neutrophils Absolute: 4.6 10*3/uL (ref 1.4–7.0)
Neutrophils: 67 %
Platelets: 187 10*3/uL (ref 150–450)
RBC: 4.02 x10E6/uL (ref 3.77–5.28)
RDW: 12.8 % (ref 11.7–15.4)
WBC: 6.9 10*3/uL (ref 3.4–10.8)

## 2021-01-26 LAB — CMP14+EGFR
ALT: 8 IU/L (ref 0–32)
AST: 14 IU/L (ref 0–40)
Albumin/Globulin Ratio: 1.5 (ref 1.2–2.2)
Albumin: 4.2 g/dL (ref 3.6–4.6)
Alkaline Phosphatase: 73 IU/L (ref 44–121)
BUN/Creatinine Ratio: 9 — ABNORMAL LOW (ref 12–28)
BUN: 11 mg/dL (ref 8–27)
Bilirubin Total: 0.4 mg/dL (ref 0.0–1.2)
CO2: 23 mmol/L (ref 20–29)
Calcium: 10 mg/dL (ref 8.7–10.3)
Chloride: 100 mmol/L (ref 96–106)
Creatinine, Ser: 1.2 mg/dL — ABNORMAL HIGH (ref 0.57–1.00)
Globulin, Total: 2.8 g/dL (ref 1.5–4.5)
Glucose: 205 mg/dL — ABNORMAL HIGH (ref 65–99)
Potassium: 4.8 mmol/L (ref 3.5–5.2)
Sodium: 138 mmol/L (ref 134–144)
Total Protein: 7 g/dL (ref 6.0–8.5)
eGFR: 44 mL/min/{1.73_m2} — ABNORMAL LOW (ref >59)

## 2021-02-02 ENCOUNTER — Ambulatory Visit (INDEPENDENT_AMBULATORY_CARE_PROVIDER_SITE_OTHER): Payer: Medicare Other | Admitting: Licensed Clinical Social Worker

## 2021-02-02 DIAGNOSIS — F411 Generalized anxiety disorder: Secondary | ICD-10-CM

## 2021-02-02 DIAGNOSIS — Z794 Long term (current) use of insulin: Secondary | ICD-10-CM | POA: Diagnosis not present

## 2021-02-02 DIAGNOSIS — E785 Hyperlipidemia, unspecified: Secondary | ICD-10-CM | POA: Diagnosis not present

## 2021-02-02 DIAGNOSIS — E119 Type 2 diabetes mellitus without complications: Secondary | ICD-10-CM

## 2021-02-02 DIAGNOSIS — I10 Essential (primary) hypertension: Secondary | ICD-10-CM | POA: Diagnosis not present

## 2021-02-02 DIAGNOSIS — K219 Gastro-esophageal reflux disease without esophagitis: Secondary | ICD-10-CM

## 2021-02-02 DIAGNOSIS — I6930 Unspecified sequelae of cerebral infarction: Secondary | ICD-10-CM

## 2021-02-02 DIAGNOSIS — N1831 Chronic kidney disease, stage 3a: Secondary | ICD-10-CM | POA: Diagnosis not present

## 2021-02-02 DIAGNOSIS — F3342 Major depressive disorder, recurrent, in full remission: Secondary | ICD-10-CM

## 2021-02-02 NOTE — Chronic Care Management (AMB) (Signed)
Chronic Care Management    Clinical Social Work Note  02/02/2021 Name: Kara Dean MRN: [id number] DOB: [birth year]  Kara Dean is a 87 y.o. year old female who is a primary care patient of [name], [place]. The CCM team was consulted to assist the patient with chronic disease management and/or care coordination needs related to: [employer] .   Engaged with patient/son of patient by telephone for follow up visit in response to provider referral for social work chronic care management and care coordination services.   Consent to Services:  The patient was given information about Chronic Care Management services, agreed to services, and gave verbal consent prior to initiation of services.  Please see initial visit note for detailed documentation.   Patient agreed to services and consent obtained.   Assessment: Review of patient past medical history, allergies, medications, and health status, including review of relevant consultants reports was performed today as part of a comprehensive evaluation and provision of chronic care management and care coordination services.     SDOH (Social Determinants of Health) assessments and interventions performed:    Advanced Directives Status: See Vynca application for related entries.  CCM Care Plan  Allergies  Allergen Reactions  . Codeine Other (See Comments)    unknown  . Latex Rash  . Morphine Other (See Comments)    unknown  . Niacin Other (See Comments)    unknown  . Sulfa Antibiotics Rash  . Sulfonamide Derivatives Rash    Outpatient Encounter Medications as of 02/02/2021  Medication Sig  . amLODipine (NORVASC) 10 MG tablet Take 1 tablet (10 mg total) by mouth daily. (NEEDS TO BE SEEN BEFORE NEXT REFILL)  . atorvastatin (LIPITOR) 10 MG tablet Take 1 tablet (10 mg total) by mouth daily. (NEEDS TO BE SEEN BEFORE NEXT REFILL)  . B Complex Vitamins (B COMPLEX-B12) TABS Take 1 tablet by mouth daily.  .  Cholecalciferol (VITAMIN D3 PO) Take 1 capsule daily by mouth.  . citalopram (CELEXA) 20 MG tablet Take 1 tablet (20 mg total) by mouth daily. (NEEDS TO BE SEEN BEFORE NEXT REFILL)  . dipyridamole-aspirin (AGGRENOX) 200-25 MG 12hr capsule TAKE  (1)  CAPSULE  TWICE DAILY.  . fluticasone (FLONASE) 50 MCG/ACT nasal spray USE 2 SPRAYS IN EACH NOSTRIL ONCE DAILY.  . furosemide (LASIX) 20 MG tablet Take 1 tablet (20 mg total) by mouth daily. (NEEDS TO BE SEEN BEFORE NEXT REFILL)  . glucose blood (ONETOUCH ULTRA) test strip Check BS up to 3 times daily Dx E11.9  . insulin glargine (LANTUS) 100 UNIT/ML injection Inject 0.48 mLs (48 Units total) into the skin every morning.  Marland Kitchen LORazepam (ATIVAN) 0.5 MG tablet Take 1 tablet (0.5 mg total) by mouth 2 (two) times daily.  . Magnesium 250 MG TABS Take 250 mg by mouth daily.  . metoprolol succinate (TOPROL-XL) 100 MG 24 hr tablet TAKE 1 TABLET ONCE DAILY WITH A MEAL (NEEDS TO BE SEEN BEFORE NEXT REFILL)  . olmesartan (BENICAR) 40 MG tablet Take 1 tablet (40 mg total) by mouth daily. (NEEDS TO BE SEEN BEFORE NEXT REFILL)  . Omega-3 Fatty Acids (FISH OIL) 1000 MG CAPS Take 1 capsule by mouth daily.  . pantoprazole (PROTONIX) 40 MG tablet Take 1 tablet (40 mg total) by mouth daily. (NEEDS TO BE SEEN BEFORE NEXT REFILL)  . potassium chloride (KLOR-CON) 10 MEQ tablet Take 1 tablet (10 mEq total) by mouth daily. (NEEDS TO BE SEEN BEFORE NEXT REFILL)   No facility-administered encounter  medications on file as of 02/02/2021.    Patient Active Problem List   Diagnosis Date Noted  . Peripheral edema 03/18/2017  . Chronic kidney disease (CKD) stage G3a/A3, moderately decreased glomerular filtration rate (GFR) between 45-59 mL/min/1.73 square meter and albuminuria creatinine ratio greater than 300 mg/g (HCC) 07/27/2014  . Gastroesophageal reflux disease without esophagitis 04/27/2014  . GAD (generalized anxiety disorder) 04/27/2014  . Late effect of cerebrovascular  accident (CVA) 03/26/2010  . Hyperlipidemia 02/01/2009  . Depression 02/01/2009  . Essential hypertension 02/01/2009  . Osteoarthritis 02/01/2009  . Type 2 diabetes mellitus treated with insulin ([place]) 02/01/2009    Conditions to be addressed/monitored: Monitor anxiety and depression issues of client  Care Plan : LCSW Care Plan  Updates made by Katha Cabal, LCSW since 02/02/2021 12:00 AM    Problem: Emotional Distress     Goal: Manage Anxiety and Depession issues faced   Start Date: 02/02/2021  Expected End Date: 05/04/2021  This Visit's Progress: On track  Priority: Medium  Note:   Current Barriers:  . Chronic Mental Health needs related to management of anxiety and depression issues . Decreased appetite . Mobility challenges . Suicidal Ideation/Homicidal Ideation: No  Clinical Social Work Goal(s):  . patient will work with SW monthly by telephone or in person to reduce or manage symptoms related to anxiety and depression issues of client . Patient will attend all scheduled client medical appointments in next 30 days . Patient will call RNCM or LCSW as needed for CCM support in next 30 days  Interventions: . 1:1 collaboration with [name], FNP regarding development and update of comprehensive plan of care as evidenced by provider attestation and co-signature . Chart reviewed to determine client needs . Talked briefly with son of client today about client contact information . Ane Payment, daughter of client, today but was unable to speak via phone with [name]  Patient Self Care Activities:  . Takes medications as prescribed . Tries to complete ADLs as able  Patient Coping Strengths:  . Has family support . Attends scheduled medical appointments . Has no transport needs  Patient Self Care Deficits:  . Decreased appetite . Some mobility issues  Patient Goals:  - spend time or talk with others every day - practice relaxation or meditation daily - keep  a calendar with appointment dates  Follow Up Plan:  LCSW to call client or her daughter on 03/09/21    Norva Riffle.Valera Vallas MSW, LCSW Licensed Clinical Social Worker [facility] Care Management [phone]

## 2021-02-02 NOTE — Patient Instructions (Signed)
Visit Information  PATIENT GOALS: Goals Addressed            This Visit's Progress   . Manage Anxiety and Depression issues faced       Timeframe:  Short-Term Goal Priority:  Medium Progress: On Track Start Date:             02/02/21                Expected End Date:    05/04/21                   Follow Up Date 03/09/21    Manage My Emotions (Patient)  Manage Anxiety and Depression issues faced    Why is this important?    When you are stressed, down or upset, your body reacts too.   For example, your blood pressure may get higher; you may have a headache or stomachache.   When your emotions get the best of you, your body's ability to fight off cold and flu gets weak.   These steps will help you manage your emotions.    Patient Self Care Activities:  . Takes medications as prescribed . Tries to complete ADLs as able  Patient Coping Strengths:  . Has family support . Attends scheduled medical appointments . Has no transport needs  Patient Self Care Deficits:  . Decreased appetite . Some mobility issues  Patient Goals:  - spend time or talk with others every day - practice relaxation or meditation daily - keep a calendar with appointment dates  Follow Up Plan:  LCSW to call client or her daughter on 03/09/21        Norva Riffle.Havish Petties MSW, LCSW Licensed Clinical Social Worker [facility] Care Management [phone]

## 2021-02-13 ENCOUNTER — Other Ambulatory Visit: Payer: Self-pay | Admitting: Nurse Practitioner

## 2021-02-13 DIAGNOSIS — I2510 Atherosclerotic heart disease of native coronary artery without angina pectoris: Secondary | ICD-10-CM

## 2021-02-13 DIAGNOSIS — I10 Essential (primary) hypertension: Secondary | ICD-10-CM

## 2021-02-13 DIAGNOSIS — R609 Edema, unspecified: Secondary | ICD-10-CM

## 2021-02-19 ENCOUNTER — Ambulatory Visit (INDEPENDENT_AMBULATORY_CARE_PROVIDER_SITE_OTHER): Payer: Medicare Other

## 2021-02-19 VITALS — Ht 59.0 in | Wt 121.0 lb

## 2021-02-19 DIAGNOSIS — Z0000 Encounter for general adult medical examination without abnormal findings: Secondary | ICD-10-CM

## 2021-02-19 NOTE — Progress Notes (Addendum)
Subjective:   Kara Dean is a 87 y.o. female who presents for Medicare Annual (Subsequent) preventive examination.  Virtual Visit via Telephone Note  I connected with  [name] on 02/19/21 at 10:30 AM EDT by telephone and verified that I am speaking with the correct person using two identifiers.  Location: Patient: Home Provider: [facility] Persons participating in the virtual visit: patient/Nurse Health Advisor   I discussed the limitations, risks, security and privacy concerns of performing an evaluation and management service by telephone and the availability of in person appointments. The patient expressed understanding and agreed to proceed.  Interactive audio and video telecommunications were attempted between this nurse and patient, however failed, due to patient having technical difficulties OR patient did not have access to video capability.  We continued and completed visit with audio only.  Some vital signs may be absent or patient reported.   [name] E [name], LPN   Review of Systems     Cardiac Risk Factors include: advanced age (>23mn, >>71women);diabetes mellitus;dyslipidemia;hypertension;sedentary lifestyle     Objective:    Today's Vitals   02/19/21 1024  Weight: 121 lb (54.9 kg)  Height: 4' 11"  (1.499 m)   Body mass index is 24.44 kg/m.  Advanced Directives 02/19/2021 02/17/2020 01/10/2018 01/09/2018 10/10/2017 09/19/2017 09/16/2017  Does Patient Have a Medical Advance Directive? No No No No No No No  Would patient like information on creating a medical advance directive? Yes (MAU/Ambulatory/Procedural Areas - Information given) No - Patient declined Yes (Inpatient - patient requests chaplain consult to create a medical advance directive);Yes (Inpatient - patient defers creating a medical advance directive at this time) - No - Patient declined No - Patient declined No - Patient declined    Current Medications (verified) Outpatient Encounter Medications as of  02/19/2021  Medication Sig  . amLODipine (NORVASC) 10 MG tablet TAKE 1 TABLET ONCE DAILY  . atorvastatin (LIPITOR) 10 MG tablet Take 1 tablet (10 mg total) by mouth daily. (NEEDS TO BE SEEN BEFORE NEXT REFILL)  . B Complex Vitamins (B COMPLEX-B12) TABS Take 1 tablet by mouth daily.  . Cholecalciferol (VITAMIN D3 PO) Take 1 capsule daily by mouth.  . citalopram (CELEXA) 20 MG tablet Take 1 tablet (20 mg total) by mouth daily. (NEEDS TO BE SEEN BEFORE NEXT REFILL)  . dipyridamole-aspirin (AGGRENOX) 200-25 MG 12hr capsule TAKE  (1)  CAPSULE  TWICE DAILY.  . fluticasone (FLONASE) 50 MCG/ACT nasal spray USE 2 SPRAYS IN EACH NOSTRIL ONCE DAILY.  . furosemide (LASIX) 20 MG tablet TAKE 1 TABLET ONCE DAILY  . glucose blood (ONETOUCH ULTRA) test strip Check BS up to 3 times daily Dx E11.9  . insulin glargine (LANTUS) 100 UNIT/ML injection Inject 0.48 mLs (48 Units total) into the skin every morning.  .Marland KitchenLORazepam (ATIVAN) 0.5 MG tablet Take 1 tablet (0.5 mg total) by mouth 2 (two) times daily.  . Magnesium 250 MG TABS Take 250 mg by mouth daily.  . metoprolol succinate (TOPROL-XL) 100 MG 24 hr tablet TAKE 1 TABLET ONCE DAILY WITH A MEAL  . olmesartan (BENICAR) 40 MG tablet TAKE 1 TABLET ONCE DAILY  . Omega-3 Fatty Acids (FISH OIL) 1000 MG CAPS Take 1 capsule by mouth daily.  . pantoprazole (PROTONIX) 40 MG tablet Take 1 tablet (40 mg total) by mouth daily. (NEEDS TO BE SEEN BEFORE NEXT REFILL)  . potassium chloride (KLOR-CON) 10 MEQ tablet Take 1 tablet (10 mEq total) by mouth daily. (NEEDS TO BE SEEN BEFORE  NEXT REFILL)   No facility-administered encounter medications on file as of 02/19/2021.    Allergies (verified) Codeine, Latex, Morphine, Niacin, Sulfa antibiotics, and Sulfonamide derivatives   History: Past Medical History:  Diagnosis Date  . Anxiety   . Carotid artery disease ([place])    Carotid US 11/2018:  bilateral ICA 1-39 >> repeat 2 years.   . Cataract   . Crohn's colitis ([place])   . CVA  (cerebral infarction)    No deficits  . Depression   . Diabetes mellitus    type 2  . Dyslipidemia   . GERD (gastroesophageal reflux disease)   . History of TIAs    no deficits  . Hyperlipidemia   . Hypertension   . Hyponatremia   . Nodular basal cell carcinoma (BCC) 04/22/2018   left prox, nasal root (tx p bx)  . Osteoarthritis   . SCC (squamous cell carcinoma) 04/22/2018   left shin,superior-(tx p bx), Left shin,inferior-(tx p bx )   Past Surgical History:  Procedure Laterality Date  . ABDOMINAL HYSTERECTOMY    . BACK SURGERY  1994  . CATARACT EXTRACTION W/PHACO Left 09/19/2017   Procedure: CATARACT EXTRACTION PHACO AND INTRAOCULAR LENS PLACEMENT (IOC);  Surgeon: Baruch Goldmann, MD;  Location: [facility];  Service: Ophthalmology;  Laterality: Left;  CDE: 8.10  . CATARACT EXTRACTION W/PHACO Right 10/10/2017   Procedure: CATARACT EXTRACTION PHACO AND INTRAOCULAR LENS PLACEMENT RIGHT EYE;  Surgeon: Baruch Goldmann, MD;  Location: [facility];  Service: Ophthalmology;  Laterality: Right;  CDE: 9.60  . COLON RESECTION  1990's  . TUBAL LIGATION    . VESICOVAGINAL FISTULA CLOSURE W/ TAH  1984   Family History  Problem Relation Age of Onset  . Coronary artery disease Sister        stent x 3  . Seizures Sister   . Coronary artery disease Brother        stent  . Hypertension Mother   . Stroke Mother   . Emphysema Father   . Alcohol abuse Brother   . Lung disease Brother   . Heart attack Daughter    Social History   Socioeconomic History  . Marital status: Widowed    Spouse name: Not on file  . Number of children: 6  . Years of education: 54  . Highest education level: 10th grade  Occupational History  . Occupation: Retired    Comment: [employer]  Tobacco Use  . Smoking status: Never Smoker  . Smokeless tobacco: Never Used  Vaping Use  . Vaping Use: Never used  Substance and Sexual Activity  . Alcohol use: No  . Drug use: No  . Sexual activity: Not Currently  Other Topics  Concern  . Not on file  Social History Narrative   Kara Dean is retired and widowed. She lives at home and her granddaughter lives with her. She lives within walking distance to her 2 daughter's houses. Has an indoor cat. Her sister moved to [place] and she doesn't get to get to visit with her often but does talk with her on the phone. She has 6 children and many grandchildren and great grandchildren.    Social Determinants of Health   Financial Resource Strain: Low Risk   . Difficulty of Paying Living Expenses: Not hard at all  Food Insecurity: No Food Insecurity  . Worried About Charity fundraiser in the Last Year: Never true  . Ran Out of Food in the Last Year: Never true  Transportation Needs: Unmet Transportation  Needs  . Lack of Transportation (Medical): Yes  . Lack of Transportation (Non-Medical): No  Physical Activity: Inactive  . Days of Exercise per Week: 0 days  . Minutes of Exercise per Session: 0 min  Stress: Stress Concern Present  . Feeling of Stress : Rather much  Social Connections: Socially Isolated  . Frequency of Communication with Friends and Family: More than three times a week  . Frequency of Social Gatherings with Friends and Family: Once a week  . Attends Religious Services: Never  . Active Member of Clubs or Organizations: No  . Attends Archivist Meetings: Never  . Marital Status: Widowed    Tobacco Counseling Counseling given: No   Clinical Intake:  Pre-visit preparation completed: Yes  Pain : No/denies pain     BMI - recorded: 24.44 Nutritional Status: BMI of 19-24  Normal Nutritional Risks: None Diabetes: Yes CBG done?: No Did pt. bring in CBG monitor from home?: No  How often do you need to have someone help you when you read instructions, pamphlets, or other written materials from your doctor or pharmacy?: 1 - Never  Nutrition Risk Assessment:  Has the patient had any N/V/D within the last 2 months?  No  Does the  patient have any non-healing wounds?  Yes  place on leg where skin cancer was removed about a year ago - stays sore  - she feels it isn't healing properly Has the patient had any unintentional weight loss or weight gain?  No   Diabetes:  Is the patient diabetic?  Yes  If diabetic, was a CBG obtained today?  No  Did the patient bring in their glucometer from home?  No  How often do you monitor your CBG's? Fasting daily.   Financial Strains and Diabetes Management:  Are you having any financial strains with the device, your supplies or your medication? No .  Does the patient want to be seen by Chronic Care Management for management of their diabetes?  No  Would the patient like to be referred to a Nutritionist or for Diabetic Management?  No   Diabetic Exams:  Diabetic Eye Exam: Completed 01/13/2019. Overdue for diabetic eye exam. Pt has been advised about the importance in completing this exam.   Diabetic Foot Exam: Completed 08/21/2020. Pt has been advised about the importance in completing this exam. Pt is scheduled for diabetic foot exam on 05/01/2021.    Interpreter Needed?: No  Information entered by :: Arlan Birks, LPN   Activities of Daily Living In your present state of health, do you have any difficulty performing the following activities: 02/19/2021  Hearing? Y  Vision? Y  Walking or climbing stairs? Y  Dressing or bathing? N  Doing errands, shopping? N  Preparing Food and eating ? N  Using the Toilet? N  In the past six months, have you accidently leaked urine? Y  Comment wears pads  Do you have problems with loss of bowel control? N  Managing your Medications? N  Managing your Finances? N  Housekeeping or managing your Housekeeping? N  Some recent data might be hidden    Patient Care Team: [name], FNP as PCP - General (Nurse Practitioner) Sherren Mocha, MD as PCP - Cardiology (Cardiology) Baruch Goldmann, MD as Consulting Physician  (Ophthalmology) Melina Schools, OD (Optometry) Sheffield, Ronalee Red, PA-C as Physician Assistant (Dermatology) Ilean China, RN as Registered Nurse Shea Evans, Norva Riffle, LCSW as Social Worker (Licensed Clinical Social Worker)  Indicate any  recent Medical Services you may have received from other than [facility] providers in the past year (date may be approximate).     Assessment:   This is a routine wellness examination for Sherra.  Hearing/Vision screen  Hearing Screening   125Hz  250Hz  500Hz  1000Hz  2000Hz  3000Hz  4000Hz  6000Hz  8000Hz   Right ear:           Left ear:           Comments: C/o mild hearing loss - declines hearing aids.  Vision Screening Comments: C/o vision getting worse, especially right eye; Annual visits with [facility] in [place]  Dietary issues and exercise activities discussed: Current Exercise Habits: The patient does not participate in regular exercise at present, Exercise limited by: None identified  Goals    .  Chronic Disease Management Needs      CARE PLAN ENTRY (see longtitudinal plan of care for additional care plan information)  Current Barriers:  . Chronic Disease Management support, education, and care coordination needs related to HTN, GERD, Crohn's, DM, CVA, osteoarthritis, CKD, GAD, Depression, HLD  Clinical Goal(s) related to HTN, GERD, Crohn's, DM, CVA, osteoarthritis, CKD, GAD, Depression, HLD:  Over the next 45 days, patient will:  . Work with the care management team to address educational, disease management, and care coordination needs  . Begin or continue self health monitoring activities as directed today Measure and record cbg (blood glucose) 2 times daily and Measure and record blood pressure 3 times per week . Call provider office for new or worsened signs and symptoms Blood glucose findings outside established parameters and Blood pressure findings outside established parameters . Call care management team with questions or concerns . Verbalize  basic understanding of patient centered plan of care established today  Interventions related to HTN, GERD, Crohn's, DM, CVA, osteoarthritis, CKD, GAD, Depression, HLD:  . Evaluation of current treatment plans and patient's adherence to plan as established by provider . Assessed patient understanding of disease states . Assessed patient's education and care coordination needs . Provided disease specific education to patient  . Collaborated with appropriate clinical care team members regarding patient needs . Recommended patient talk with LCSW regarding stress management . Provided with RN Care Manager telephone number and encouraged to reach out as needed  Patient Self Care Activities related to HTN, GERD, Crohn's, DM, CVA, osteoarthritis, CKD, GAD, Depression, HLD:  . Patient is unable to independently self-manage chronic health conditions  Initial goal documentation      .  Client will talk with LCSW in next 30 days to talk about anxiety or depression issues of client (pt-stated)      CARE PLAN ENTRY   Current Barriers:  Marland Kitchen Mobility issues . Anxiety issues of client with chronic diagnoses of HLD, Depression HTN, Hx CVA, OA, Type 2 DM, GERD, GAD, CKD  Clinical Social Work Clinical Goal(s):  Marland Kitchen LCSW to call client/daugher in next 30 days to tlak about anxiety or depression issues of clinet  Interventions: . LCSW talked with client about relaxation techniques of client (likes to visit with children, likes to ride in car, watches TV, likes to listen to religous service,likes to talk via phone with relatives) . Talked with client about CCM program  . Talked with client about decreased appetite . Talked with client about sleeping challenges . Talked with client about pain issues of client . Talked with client about mobility challenges  . Talked with client about anxiety issues of client (family stress issues) . Talked about social support  network (daughter is supportive) . Talked with  client and her daughter about client food stamps benefit . Talked with client about medication procurement . Talked with clinet about ADLs completion of client  Patient Self Care Activities:  Completes ADLs Takes medications as prescribed Attends medical appointment  Patient Self Care Deficits:  Marland Kitchen Mobility issues . Anxiety issues  Initial goal documentation     .  Exercise 150 min/wk Moderate Activity      Chair exercises or walking daily for 30 minutes    .  Manage Anxiety and Depression issues faced      Timeframe:  Short-Term Goal Priority:  Medium Progress: On Track Start Date:             02/02/21                Expected End Date:    05/04/21                   Follow Up Date 03/09/21    Manage My Emotions (Patient)  Manage Anxiety and Depression issues faced    Why is this important?    When you are stressed, down or upset, your body reacts too.   For example, your blood pressure may get higher; you may have a headache or stomachache.   When your emotions get the best of you, your body's ability to fight off cold and flu gets weak.   These steps will help you manage your emotions.    Patient Self Care Activities:  . Takes medications as prescribed . Tries to complete ADLs as able  Patient Coping Strengths:  . Has family support . Attends scheduled medical appointments . Has no transport needs  Patient Self Care Deficits:  . Decreased appetite . Some mobility issues  Patient Goals:  - spend time or talk with others every day - practice relaxation or meditation daily - keep a calendar with appointment dates  Follow Up Plan:  LCSW to call client or her daughter on 03/09/21      .  Patient Stated      02/17/2020 AWV Goal: Exercise for General Health   Patient will verbalize understanding of the benefits of increased physical activity:  Exercising regularly is important. It will improve your overall fitness, flexibility, and endurance.  Regular  exercise also will improve your overall health. It can help you control your weight, reduce stress, and improve your bone density.  Over the next year, patient will increase physical activity as tolerated with a goal of at least 150 minutes of moderate physical activity per week.   You can tell that you are exercising at a moderate intensity if your heart starts beating faster and you start breathing faster but can still hold a conversation.  Moderate-intensity exercise ideas include:  Walking 1 mile (1.6 km) in about 15 minutes  Biking  Hiking  Golfing  Dancing  Water aerobics  Patient will verbalize understanding of everyday activities that increase physical activity by providing examples like the following: ? Yard work, such as: ? Pushing a Conservation officer, nature ? Raking and bagging leaves ? Washing your car ? Pushing a stroller ? Shoveling snow ? Gardening ? Washing windows or floors  Patient will be able to explain general safety guidelines for exercising:   Before you start a new exercise program, talk with your health care provider.  Do not exercise so much that you hurt yourself, feel dizzy, or get very short of breath.  Wear  comfortable clothes and wear shoes with good support.  Drink plenty of water while you exercise to prevent dehydration or heat stroke.  Work out until your breathing and your heartbeat get faster.     .  Prevent falls      Use a light when going to the bathroom at night       Depression Screen PHQ 2/9 Scores 02/19/2021 01/25/2021 08/21/2020 05/05/2020 03/03/2020 02/17/2020 02/14/2020  PHQ - 2 Score 0 6 0 0 2 2 0  PHQ- 9 Score 4 10 - - 6 11 -    Fall Risk Fall Risk  02/19/2021 01/25/2021 08/21/2020 05/05/2020 02/18/2020  Falls in the past year? 0 0 1 0 0  Comment - - - - -  Number falls in past yr: 0 - 0 - 0  Injury with Fall? 0 - 0 - 0  Comment - - - - -  Risk Factor Category  - - - - -  Risk for fall due to : Medication side effect - - - -  Risk  for fall due to: Comment - - - - -  Follow up Falls prevention discussed - - - -    FALL RISK PREVENTION PERTAINING TO THE HOME:  Any stairs in or around the home? No  If so, are there any without handrails? No  Home free of loose throw rugs in walkways, pet beds, electrical cords, etc? Yes  Adequate lighting in your home to reduce risk of falls? Yes   ASSISTIVE DEVICES UTILIZED TO PREVENT FALLS:  Life alert? No  Use of a cane, walker or w/c? Yes  Grab bars in the bathroom? No  Shower chair or bench in shower? No  Elevated toilet seat or a handicapped toilet? No   TIMED UP AND GO:  Was the test performed? No . Telephonic visit  Cognitive Function: MMSE - Mini Mental State Exam 07/26/2015  Orientation to time 5  Orientation to Place 5  Registration 3  Attention/ Calculation 3  Recall 3  Language- name 2 objects 2  Language- repeat 1  Language- follow 3 step command 2  Language- read & follow direction 1  Write a sentence 1  Copy design 1  Total score 27     6CIT Screen 02/17/2020 09/21/2018  What Year? 0 points 0 points  What month? 0 points 0 points  What time? 0 points 0 points  Count back from 20 2 points 0 points  Months in reverse 0 points 0 points  Repeat phrase 2 points 0 points  Total Score 4 0    Immunizations Immunization History  Administered Date(s) Administered  . Influenza Split 07/23/2013, 07/24/2015  . Influenza,inj,Quad PF,6+ Mos 08/01/2017, 08/15/2018  . Influenza,inj,quad, With Preservative 08/03/2019  . Influenza-Unspecified Jul 21, 1933, 08/14/1937, 07/18/2014, 08/05/2016, 08/17/2018, 09/17/2018, 10/05/2019, 08/23/2020  . Moderna Sars-Covid-2 Vaccination 03/20/2020, 04/17/2020  . Pneumococcal Conjugate-13 07/18/2014  . Pneumococcal Polysaccharide-23 08/04/2008, 08/17/2018  . Pneumococcal-Unspecified 08/17/2018  . Td 06/11/2005  . Tdap 08/15/2014  . Zoster 08/07/2012  . Zoster Recombinat (Shingrix) 10/05/2019    TDAP status: Up to  date  Flu Vaccine status: Up to date  Pneumococcal vaccine status: Up to date  Covid-19 vaccine status: Information provided on how to obtain vaccines.   Qualifies for Shingles Vaccine? Yes   Zostavax completed Yes   Shingrix Completed?: No.    Education has been provided regarding the importance of this vaccine. Patient has been advised to call insurance company to determine out of pocket expense  if they have not yet received this vaccine. Advised may also receive vaccine at local pharmacy or Health Dept. Verbalized acceptance and understanding.  Screening Tests Health Maintenance  Topic Date Due  . COVID-19 Vaccine (3 - Booster for Moderna series) 10/17/2020  . OPHTHALMOLOGY EXAM  04/27/2021 (Originally 01/13/2020)  . INFLUENZA VACCINE  06/04/2021  . HEMOGLOBIN A1C  07/28/2021  . FOOT EXAM  08/21/2021  . TETANUS/TDAP  08/15/2024  . DEXA SCAN  Completed  . PNA vac Low Risk Adult  Completed  . HPV VACCINES  Aged Out    Health Maintenance  Health Maintenance Due  Topic Date Due  . COVID-19 Vaccine (3 - Booster for Moderna series) 10/17/2020    Colorectal cancer screening: No longer required.   Mammogram status: No longer required due to age.  Bone Density Scan : patient declines  Lung Cancer Screening: (Low Dose CT Chest recommended if Age 61-80 years, 30 pack-year currently smoking OR have quit w/in 15years.) does not qualify.   Additional Screening:  Hepatitis C Screening: does not qualify  Vision Screening: Recommended annual ophthalmology exams for early detection of glaucoma and other disorders of the eye. Is the patient up to date with their annual eye exam?  No  Who is the provider or what is the name of the office in which the patient attends annual eye exams? [facility] in [place] If pt is not established with a provider, would they like to be referred to a provider to establish care? No .   Dental Screening: Recommended annual dental exams for proper oral  hygiene  Community Resource Referral / Chronic Care Management: CRR required this visit?  No   CCM required this visit?  No      Plan:     I have personally reviewed and noted the following in the patient's chart:   . Medical and social history . Use of alcohol, tobacco or illicit drugs  . Current medications and supplements . Functional ability and status . Nutritional status . Physical activity . Advanced directives . List of other physicians . Hospitalizations, surgeries, and ER visits in previous 12 months . Vitals . Screenings to include cognitive, depression, and falls . Referrals and appointments  In addition, I have reviewed and discussed with patient certain preventive protocols, quality metrics, and best practice recommendations. A written personalized care plan for preventive services as well as general preventive health recommendations were provided to patient.     Sandrea Hammond, LPN   11/26/4823   Nurse Notes: None   I have reviewed and agree with the above AWV documentation.  Claretta Fraise, M.D.

## 2021-02-19 NOTE — Patient Instructions (Signed)
Kara Dean , Thank you for taking time to come for your Medicare Wellness Visit. I appreciate your ongoing commitment to your health goals. Please review the following plan we discussed and let me know if I can assist you in the future.   Screening recommendations/referrals: Colonoscopy: Done 03/05/2011 - No longer required Mammogram: Done 08/03/2014 - No longer required Bone Density: Done 07/27/2014 - No longer required Recommended yearly ophthalmology/optometry visit for glaucoma screening and checkup Recommended yearly dental visit for hygiene and checkup  Vaccinations: Influenza vaccine: Done 08/23/2020 - Repeat annually Pneumococcal vaccine: Done 07/18/2014 & 08/17/2018 Tdap vaccine: Done 08/15/2014 - repeat in 10 years Shingles vaccine: first dose done 08/05/2019 - Due for second dose*   Covid-19:Done 03/20/2020 & 04/17/2020 - Due for Booster*  Advanced directives: Advance directive discussed with you today. I have provided a copy for you to complete at home and have notarized. Once this is complete please bring a copy in to our office so we can scan it into your chart.   Conditions/risks identified: continue fall prevention - try to get 30 minutes of physical activity each day and drink 6-8 glasses of water daily.  Next appointment: Follow up in one year for your annual wellness visit    Preventive Care 65 Years and Older, Female Preventive care refers to lifestyle choices and visits with your health care provider that can promote health and wellness. What does preventive care include?  A yearly physical exam. This is also called an annual well check.  Dental exams once or twice a year.  Routine eye exams. Ask your health care provider how often you should have your eyes checked.  Personal lifestyle choices, including:  Daily care of your teeth and gums.  Regular physical activity.  Eating a healthy diet.  Avoiding tobacco and drug use.  Limiting alcohol  use.  Practicing safe sex.  Taking low-dose aspirin every day.  Taking vitamin and mineral supplements as recommended by your health care provider. What happens during an annual well check? The services and screenings done by your health care provider during your annual well check will depend on your age, overall health, lifestyle risk factors, and family history of disease. Counseling  Your health care provider may ask you questions about your:  Alcohol use.  Tobacco use.  Drug use.  Emotional well-being.  Home and relationship well-being.  Sexual activity.  Eating habits.  History of falls.  Memory and ability to understand (cognition).  Work and work Statistician.  Reproductive health. Screening  You may have the following tests or measurements:  Height, weight, and BMI.  Blood pressure.  Lipid and cholesterol levels. These may be checked every 5 years, or more frequently if you are over 8 years old.  Skin check.  Lung cancer screening. You may have this screening every year starting at age 11 if you have a 30-pack-year history of smoking and currently smoke or have quit within the past 15 years.  Fecal occult blood test (FOBT) of the stool. You may have this test every year starting at age 75.  Flexible sigmoidoscopy or colonoscopy. You may have a sigmoidoscopy every 5 years or a colonoscopy every 10 years starting at age 63.  Hepatitis C blood test.  Hepatitis B blood test.  Sexually transmitted disease (STD) testing.  Diabetes screening. This is done by checking your blood sugar (glucose) after you have not eaten for a while (fasting). You may have this done every 1-3 years.  Bone density scan.  This is done to screen for osteoporosis. You may have this done starting at age 75.  Mammogram. This may be done every 1-2 years. Talk to your health care provider about how often you should have regular mammograms. Talk with your health care provider about  your test results, treatment options, and if necessary, the need for more tests. Vaccines  Your health care provider may recommend certain vaccines, such as:  Influenza vaccine. This is recommended every year.  Tetanus, diphtheria, and acellular pertussis (Tdap, Td) vaccine. You may need a Td booster every 10 years.  Zoster vaccine. You may need this after age 19.  Pneumococcal 13-valent conjugate (PCV13) vaccine. One dose is recommended after age 19.  Pneumococcal polysaccharide (PPSV23) vaccine. One dose is recommended after age 76. Talk to your health care provider about which screenings and vaccines you need and how often you need them. This information is not intended to replace advice given to you by your health care provider. Make sure you discuss any questions you have with your health care provider. Document Released: 11/17/2015 Document Revised: 07/10/2016 Document Reviewed: 08/22/2015 Elsevier Interactive Patient Education  2017 [place] Prevention in the Home Falls can cause injuries. They can happen to people of all ages. There are many things you can do to make your home safe and to help prevent falls. What can I do on the outside of my home?  Regularly fix the edges of walkways and driveways and fix any cracks.  Remove anything that might make you trip as you walk through a door, such as a raised step or threshold.  Trim any bushes or trees on the path to your home.  Use bright outdoor lighting.  Clear any walking paths of anything that might make someone trip, such as rocks or tools.  Regularly check to see if handrails are loose or broken. Make sure that both sides of any steps have handrails.  Any raised decks and porches should have guardrails on the edges.  Have any leaves, snow, or ice cleared regularly.  Use sand or salt on walking paths during winter.  Clean up any spills in your garage right away. This includes oil or grease spills. What  can I do in the bathroom?  Use night lights.  Install grab bars by the toilet and in the tub and shower. Do not use towel bars as grab bars.  Use non-skid mats or decals in the tub or shower.  If you need to sit down in the shower, use a plastic, non-slip stool.  Keep the floor dry. Clean up any water that spills on the floor as soon as it happens.  Remove soap buildup in the tub or shower regularly.  Attach bath mats securely with double-sided non-slip rug tape.  Do not have throw rugs and other things on the floor that can make you trip. What can I do in the bedroom?  Use night lights.  Make sure that you have a light by your bed that is easy to reach.  Do not use any sheets or blankets that are too big for your bed. They should not hang down onto the floor.  Have a firm chair that has side arms. You can use this for support while you get dressed.  Do not have throw rugs and other things on the floor that can make you trip. What can I do in the kitchen?  Clean up any spills right away.  Avoid walking on wet floors.  Keep items that you use a lot in easy-to-reach places.  If you need to reach something above you, use a strong step stool that has a grab bar.  Keep electrical cords out of the way.  Do not use floor polish or wax that makes floors slippery. If you must use wax, use non-skid floor wax.  Do not have throw rugs and other things on the floor that can make you trip. What can I do with my stairs?  Do not leave any items on the stairs.  Make sure that there are handrails on both sides of the stairs and use them. Fix handrails that are broken or loose. Make sure that handrails are as long as the stairways.  Check any carpeting to make sure that it is firmly attached to the stairs. Fix any carpet that is loose or worn.  Avoid having throw rugs at the top or bottom of the stairs. If you do have throw rugs, attach them to the floor with carpet tape.  Make sure  that you have a light switch at the top of the stairs and the bottom of the stairs. If you do not have them, ask someone to add them for you. What else can I do to help prevent falls?  Wear shoes that:  Do not have high heels.  Have rubber bottoms.  Are comfortable and fit you well.  Are closed at the toe. Do not wear sandals.  If you use a stepladder:  Make sure that it is fully opened. Do not climb a closed stepladder.  Make sure that both sides of the stepladder are locked into place.  Ask someone to hold it for you, if possible.  Clearly mark and make sure that you can see:  Any grab bars or handrails.  First and last steps.  Where the edge of each step is.  Use tools that help you move around (mobility aids) if they are needed. These include:  Canes.  Walkers.  Scooters.  Crutches.  Turn on the lights when you go into a dark area. Replace any light bulbs as soon as they burn out.  Set up your furniture so you have a clear path. Avoid moving your furniture around.  If any of your floors are uneven, fix them.  If there are any pets around you, be aware of where they are.  Review your medicines with your doctor. Some medicines can make you feel dizzy. This can increase your chance of falling. Ask your doctor what other things that you can do to help prevent falls. This information is not intended to replace advice given to you by your health care provider. Make sure you discuss any questions you have with your health care provider. Document Released: 08/17/2009 Document Revised: 03/28/2016 Document Reviewed: 11/25/2014 Elsevier Interactive Patient Education  2017 [employer].

## 2021-03-09 ENCOUNTER — Telehealth: Payer: Medicare Other

## 2021-03-22 DIAGNOSIS — B351 Tinea unguium: Secondary | ICD-10-CM | POA: Diagnosis not present

## 2021-03-22 DIAGNOSIS — E1142 Type 2 diabetes mellitus with diabetic polyneuropathy: Secondary | ICD-10-CM | POA: Diagnosis not present

## 2021-03-22 DIAGNOSIS — L84 Corns and callosities: Secondary | ICD-10-CM | POA: Diagnosis not present

## 2021-03-22 DIAGNOSIS — M79676 Pain in unspecified toe(s): Secondary | ICD-10-CM | POA: Diagnosis not present

## 2021-04-02 NOTE — Progress Notes (Signed)
Cardiology Office Note:    Date:  04/03/2021   ID:  Kara Dean February 09, 1933, MRN [id number]  PCP:  Kara Dean, [facility] Providers Cardiologist:  Kara Mocha, MD Cardiology APP:  Kara Dean      Referring MD: Kara Dean, *   Chief Complaint:  Follow-up (HTN, HLD, carotid artery disease)    Patient Profile:    Kara Dean is a 87 y.o. female with:   Hypertension   Hyperlipidemia   Diabetes mellitus   Longstanding chest pain   Hx of TIA   Carotid artery disease  Mild to mod AI  Mild MR  Prior CV studies: Echocardiogram 04/14/20 EF 60-65, no RWMA, GRII DD, normal RVSF, RVSP 69.5, moderate LAE, mild MR, mild-moderate AI, AV sclerosis without stenosis  Carotid US 11/11/2018 Bilat ICA 1-39 >> repeat in 1/22  Carotid US 05/27/17 Heterogeneous plaque, bilaterally. Progression of RICA disease with higher velocities compared to prior exam, now in high end 1-39% range of stenosis. Stable 2-33% LICA stenosis. Normal subclavian arteries, bilaterally. Patent vertebral arteries with antegrade flow. f/u 1 year due to prograssion of disease  Nuclear stress test 02/15/13 Normal stress nuclear study.  LV Ejection Fraction: 68%.   History of Present Illness: Kara Dean was last seen by Kara Dean in 5/21.  She returns for f/u.  She is here alone.  Overall, she has been doing well.  She has chronic chest symptoms.  She has not had any significant change in her chest pain pattern.  She has not had exertional symptoms.  She does get short of breath sometimes with activities.  This is overall stable.  She tends to sleep on 2 pillows.  She has not had significant leg edema.  She has not had syncope.  She sometimes gets lightheaded when she stands.        Past Medical History:  Diagnosis Date  . Anxiety   . Carotid artery disease ([place])    Carotid US 11/2018:  bilateral ICA 1-39 >> repeat 2 years.   . Cataract   . Crohn's  colitis ([place])   . CVA (cerebral infarction)    No deficits  . Depression   . Diabetes mellitus    type 2  . Dyslipidemia   . GERD (gastroesophageal reflux disease)   . History of TIAs    no deficits  . Hyperlipidemia   . Hypertension   . Hyponatremia   . Nodular basal cell carcinoma (BCC) 04/22/2018   left prox, nasal root (tx p bx)  . Osteoarthritis   . SCC (squamous cell carcinoma) 04/22/2018   left shin,superior-(tx p bx), Left shin,inferior-(tx p bx )    Current Medications: Current Meds  Medication Sig  . amLODipine (NORVASC) 10 MG tablet TAKE 1 TABLET ONCE DAILY  . atorvastatin (LIPITOR) 10 MG tablet Take 1 tablet (10 mg total) by mouth daily. (NEEDS TO BE SEEN BEFORE NEXT REFILL)  . B Complex Vitamins (B COMPLEX-B12) TABS Take 1 tablet by mouth daily.  . Cholecalciferol (VITAMIN D3 PO) Take 1 capsule daily by mouth.  . citalopram (CELEXA) 20 MG tablet Take 1 tablet (20 mg total) by mouth daily. (NEEDS TO BE SEEN BEFORE NEXT REFILL)  . dipyridamole-aspirin (AGGRENOX) 200-25 MG 12hr capsule TAKE  (1)  CAPSULE  TWICE DAILY.  . fluticasone (FLONASE) 50 MCG/ACT nasal spray USE 2 SPRAYS IN EACH NOSTRIL ONCE DAILY.  Marland Kitchen glucose blood (ONETOUCH ULTRA) test strip Check BS up to 3  times daily Dx E11.9  . LANTUS SOLOSTAR 100 UNIT/ML Solostar Pen Inject 44 Units into the skin daily.  Marland Kitchen LORazepam (ATIVAN) 0.5 MG tablet Take 1 tablet (0.5 mg total) by mouth 2 (two) times daily.  . Magnesium 250 MG TABS Take 250 mg by mouth daily.  . metoprolol succinate (TOPROL-XL) 100 MG 24 hr tablet TAKE 1 TABLET ONCE DAILY WITH A MEAL  . olmesartan (BENICAR) 40 MG tablet TAKE 1 TABLET ONCE DAILY  . Omega-3 Fatty Acids (FISH OIL) 1000 MG CAPS Take 1 capsule by mouth daily.  . pantoprazole (PROTONIX) 40 MG tablet Take 1 tablet (40 mg total) by mouth daily. (NEEDS TO BE SEEN BEFORE NEXT REFILL)  . [DISCONTINUED] furosemide (LASIX) 20 MG tablet TAKE 1 TABLET ONCE DAILY  . [DISCONTINUED] potassium  chloride (KLOR-CON) 10 MEQ tablet Take 1 tablet (10 mEq total) by mouth daily. (NEEDS TO BE SEEN BEFORE NEXT REFILL)     Allergies:   Codeine, Latex, Morphine, Niacin, Sulfa antibiotics, and Sulfonamide derivatives   Social History   Tobacco Use  . Smoking status: Never Smoker  . Smokeless tobacco: Never Used  Vaping Use  . Vaping Use: Never used  Substance Use Topics  . Alcohol use: No  . Drug use: No     Family Hx: The patient's family history includes Alcohol abuse in her brother; Coronary artery disease in her brother and sister; Emphysema in her father; Heart attack in her daughter; Hypertension in her mother; Lung disease in her brother; Seizures in her sister; Stroke in her mother.  Review of Systems  Respiratory: Negative for cough and wheezing.      EKGs/Labs/Other Test Reviewed:    EKG:  EKG is  ordered today.  The ekg ordered today demonstrates NSR, HR 62, left axis deviation, septal Q waves, poor wave progression, QTC 452, nonspecific ST-T wave changes, no significant change since prior tracing  Recent Labs: 01/25/2021: ALT 8; BUN 11; Creatinine, Ser 1.20; Hemoglobin 12.3; Platelets 187; Potassium 4.8; Sodium 138   Recent Lipid Panel Lab Results  Component Value Date/Time   CHOL 130 01/25/2021 02:58 PM   CHOL 124 02/26/2013 10:43 AM   TRIG 209 (H) 01/25/2021 02:58 PM   TRIG 132 03/09/2015 10:36 AM   TRIG 182 (H) 02/26/2013 10:43 AM   HDL 41 01/25/2021 02:58 PM   HDL 39 (L) 03/09/2015 10:36 AM   HDL 40 02/26/2013 10:43 AM   LDLCALC 55 01/25/2021 02:58 PM   LDLCALC 42 04/27/2014 02:55 PM   LDLCALC 48 02/26/2013 10:43 AM      Risk Assessment/Calculations:      Physical Exam:    VS:  BP (!) 120/40   Pulse 62   Ht 5' (1.524 m)   Wt 124 lb 12.8 oz (56.6 kg)   SpO2 95%   BMI 24.37 kg/m     Wt Readings from Last 3 Encounters:  04/03/21 124 lb 12.8 oz (56.6 kg)  02/19/21 121 lb (54.9 kg)  01/25/21 123 lb (55.8 kg)     Constitutional:       Appearance: Healthy appearance. Not in distress.  Neck:     Vascular: JVD normal.  Pulmonary:     Effort: Pulmonary effort is normal.     Breath sounds: No wheezing. No rales.  Cardiovascular:     Normal rate. Regular rhythm. Normal S1. Normal S2.     Murmurs: There is no murmur.  Edema:    Peripheral edema absent.  Abdominal:  Palpations: Abdomen is soft.  Skin:    General: Skin is warm and dry.  Neurological:     Mental Status: Alert and oriented to person, place and time.     Cranial Nerves: Cranial nerves are intact.         ASSESSMENT & PLAN:    1. Essential hypertension Diastolic blood pressure is somewhat low.  She does get lightheaded from time to time.  I have recommended decreasing furosemide to 20 mg every Monday, Wednesday, Friday.  She should reduce potassium to 10 mEq every Monday, Wednesday, Friday.  Continue current dose of amlodipine, metoprolol succinate, Benicar.  I have asked her to continue to monitor her blood pressure.  If her blood pressure continues to run low, we could decrease the dose of amlodipine.  2. Mixed hyperlipidemia LDL optimal on most recent lab work.  Continue current Rx.    3. Bilateral carotid artery stenosis Last carotid US was in 11/2018.  Schedule repeat Dopplers.  4. Valvular heart disease Mild to moderate aortic insufficiency by most recent echocardiogram.  She is clinically stable.  Consider follow-up echocardiogram in the next 1-2 years.    Dispo:  Return in about 1 year (around 04/03/2022) for Routine Follow Up, w/ Kara Dean, or Richardson Dopp, PA-C.   Medication Adjustments/Labs and Tests Ordered: Current medicines are reviewed at length with the patient today.  Concerns regarding medicines are outlined above.  Tests Ordered: Orders Placed This Encounter  Procedures  . EKG 12-Lead  . VAS US CAROTID   Medication Changes: Meds ordered this encounter  Medications  . furosemide (LASIX) 20 MG tablet    Sig: Take 1 tablet  (20 mg total) by mouth as directed. Mon, Wed, and Fri only.    Dispense:  30 tablet    Refill:  6  . potassium chloride (KLOR-CON) 10 MEQ tablet    Sig: Take 1 tablet (10 mEq total) by mouth as directed. Mon, Wed, and Fri only.    Dispense:  30 tablet    Refill:  6    Signed, Richardson Dopp, PA-C  04/03/2021 5:20 PM    [facility] [address], [place], [place]  [address] Phone: [phone]; Fax: [phone]

## 2021-04-03 ENCOUNTER — Encounter: Payer: Self-pay | Admitting: Physician Assistant

## 2021-04-03 ENCOUNTER — Ambulatory Visit (INDEPENDENT_AMBULATORY_CARE_PROVIDER_SITE_OTHER): Payer: Medicare Other | Admitting: Physician Assistant

## 2021-04-03 ENCOUNTER — Other Ambulatory Visit: Payer: Self-pay

## 2021-04-03 VITALS — BP 120/40 | HR 62 | Ht 60.0 in | Wt 124.8 lb

## 2021-04-03 DIAGNOSIS — I10 Essential (primary) hypertension: Secondary | ICD-10-CM

## 2021-04-03 DIAGNOSIS — E782 Mixed hyperlipidemia: Secondary | ICD-10-CM

## 2021-04-03 DIAGNOSIS — I6523 Occlusion and stenosis of bilateral carotid arteries: Secondary | ICD-10-CM | POA: Diagnosis not present

## 2021-04-03 DIAGNOSIS — I38 Endocarditis, valve unspecified: Secondary | ICD-10-CM

## 2021-04-03 MED ORDER — FUROSEMIDE 20 MG PO TABS: 20.0000 mg | ORAL_TABLET | ORAL | 6 refills | Status: DC

## 2021-04-03 MED ORDER — POTASSIUM CHLORIDE ER 10 MEQ PO TBCR: 10.0000 meq | EXTENDED_RELEASE_TABLET | ORAL | 6 refills | Status: DC

## 2021-04-03 NOTE — Patient Instructions (Addendum)
Medication Instructions:  Your physician has recommended you make the following change in your medication:   1.  Change Lasix one tablet by mouth ( 20 mg) Mon, Wed and Friday ONLY.  2.  Change K-dur one tablet by mouth ( 10 meq) Mon, Wed, and Friday ONLY.   *If you need a refill on your cardiac medications before your next appointment, please call your pharmacy*   Lab Work: -None  If you have labs (blood work) drawn today and your tests are completely normal, you will receive your results only by: Marland Kitchen MyChart Message (if you have MyChart) OR . A paper copy in the mail If you have any lab test that is abnormal or we need to change your treatment, we will call you to review the results.   Testing/Procedures: Your physician has requested that you have a carotid duplex. Wednesday, August 17 @ 3:00.  This test is an ultrasound of the carotid arteries in your neck. It looks at blood flow through these arteries that supply the brain with blood. Allow one hour for this exam. There are no restrictions or special instructions.     Follow-Up: At [facility], you and your health needs are our priority.  As part of our continuing mission to provide you with exceptional heart care, we have created designated Provider Care Teams.  These Care Teams include your primary Cardiologist (physician) and Advanced Practice Providers (APPs -  Physician Assistants and Nurse Practitioners) who all work together to provide you with the care you need, when you need it.  We recommend signing up for the patient portal called "MyChart".  Sign up information is provided on this After Visit Summary.  MyChart is used to connect with patients for Virtual Visits (Telemedicine).  Patients are able to view lab/test results, encounter notes, upcoming appointments, etc.  Non-urgent messages can be sent to your provider as well.   To learn more about what you can do with MyChart, go to [email or web address].    Your next  appointment:   1 year(s)  The format for your next appointment:   In Person  Provider:   You may see Sherren Mocha, MD or one of the following Advanced Practice Providers on your designated Care Team:    Richardson Dopp, [place]     Other Instructions:  Your physician wants you to follow-up in: 1 year with Dr. Burt Knack or Richardson Dopp, [place].  You will receive a reminder letter in the mail two months in advance. If you don't receive a letter, please call our office to schedule the follow-up appointment.

## 2021-04-12 DIAGNOSIS — H26493 Other secondary cataract, bilateral: Secondary | ICD-10-CM | POA: Diagnosis not present

## 2021-04-12 DIAGNOSIS — H01002 Unspecified blepharitis right lower eyelid: Secondary | ICD-10-CM | POA: Diagnosis not present

## 2021-04-12 DIAGNOSIS — H01001 Unspecified blepharitis right upper eyelid: Secondary | ICD-10-CM | POA: Diagnosis not present

## 2021-04-12 DIAGNOSIS — H02052 Trichiasis without entropian right lower eyelid: Secondary | ICD-10-CM | POA: Diagnosis not present

## 2021-04-12 DIAGNOSIS — H01004 Unspecified blepharitis left upper eyelid: Secondary | ICD-10-CM | POA: Diagnosis not present

## 2021-04-18 ENCOUNTER — Telehealth: Payer: Medicare Other

## 2021-05-01 ENCOUNTER — Encounter: Payer: Self-pay | Admitting: Nurse Practitioner

## 2021-05-01 ENCOUNTER — Other Ambulatory Visit: Payer: Self-pay

## 2021-05-01 ENCOUNTER — Ambulatory Visit (INDEPENDENT_AMBULATORY_CARE_PROVIDER_SITE_OTHER): Payer: Medicare Other | Admitting: Nurse Practitioner

## 2021-05-01 VITALS — BP 132/64 | HR 58 | Temp 97.7°F | Resp 20 | Ht 60.0 in | Wt 124.0 lb

## 2021-05-01 DIAGNOSIS — I6930 Unspecified sequelae of cerebral infarction: Secondary | ICD-10-CM

## 2021-05-01 DIAGNOSIS — I10 Essential (primary) hypertension: Secondary | ICD-10-CM | POA: Diagnosis not present

## 2021-05-01 DIAGNOSIS — I2510 Atherosclerotic heart disease of native coronary artery without angina pectoris: Secondary | ICD-10-CM | POA: Diagnosis not present

## 2021-05-01 DIAGNOSIS — F3342 Major depressive disorder, recurrent, in full remission: Secondary | ICD-10-CM

## 2021-05-01 DIAGNOSIS — Z794 Long term (current) use of insulin: Secondary | ICD-10-CM | POA: Diagnosis not present

## 2021-05-01 DIAGNOSIS — N1831 Chronic kidney disease, stage 3a: Secondary | ICD-10-CM

## 2021-05-01 DIAGNOSIS — K219 Gastro-esophageal reflux disease without esophagitis: Secondary | ICD-10-CM

## 2021-05-01 DIAGNOSIS — E119 Type 2 diabetes mellitus without complications: Secondary | ICD-10-CM

## 2021-05-01 DIAGNOSIS — R609 Edema, unspecified: Secondary | ICD-10-CM | POA: Diagnosis not present

## 2021-05-01 DIAGNOSIS — E785 Hyperlipidemia, unspecified: Secondary | ICD-10-CM | POA: Diagnosis not present

## 2021-05-01 DIAGNOSIS — F411 Generalized anxiety disorder: Secondary | ICD-10-CM

## 2021-05-01 DIAGNOSIS — Z23 Encounter for immunization: Secondary | ICD-10-CM

## 2021-05-01 LAB — BAYER DCA HB A1C WAIVED: HB A1C (BAYER DCA - WAIVED): 7 % — ABNORMAL HIGH (ref <7.0)

## 2021-05-01 MED ORDER — PANTOPRAZOLE SODIUM 40 MG PO TBEC: 40.0000 mg | DELAYED_RELEASE_TABLET | Freq: Every day | ORAL | 1 refills | Status: DC

## 2021-05-01 MED ORDER — POTASSIUM CHLORIDE ER 10 MEQ PO TBCR: 10.0000 meq | EXTENDED_RELEASE_TABLET | ORAL | 1 refills | Status: DC

## 2021-05-01 MED ORDER — AMLODIPINE BESYLATE 10 MG PO TABS: 10.0000 mg | ORAL_TABLET | Freq: Every day | ORAL | 1 refills | Status: DC

## 2021-05-01 MED ORDER — METOPROLOL SUCCINATE ER 100 MG PO TB24: ORAL_TABLET | ORAL | 1 refills | Status: DC

## 2021-05-01 MED ORDER — ASPIRIN-DIPYRIDAMOLE ER 25-200 MG PO CP12: ORAL_CAPSULE | ORAL | 1 refills | Status: DC

## 2021-05-01 MED ORDER — OLMESARTAN MEDOXOMIL 40 MG PO TABS: 40.0000 mg | ORAL_TABLET | Freq: Every day | ORAL | 1 refills | Status: DC

## 2021-05-01 MED ORDER — FUROSEMIDE 20 MG PO TABS: 20.0000 mg | ORAL_TABLET | ORAL | 1 refills | Status: DC

## 2021-05-01 MED ORDER — ATORVASTATIN CALCIUM 10 MG PO TABS: 10.0000 mg | ORAL_TABLET | Freq: Every day | ORAL | 1 refills | Status: DC

## 2021-05-01 MED ORDER — LORAZEPAM 0.5 MG PO TABS: 0.5000 mg | ORAL_TABLET | Freq: Two times a day (BID) | ORAL | 5 refills | Status: DC

## 2021-05-01 MED ORDER — CITALOPRAM HYDROBROMIDE 20 MG PO TABS
20.0000 mg | ORAL_TABLET | Freq: Every day | ORAL | 1 refills | Status: DC
Start: 2021-05-01 — End: 2021-08-30

## 2021-05-01 NOTE — Addendum Note (Signed)
Addended by: Rolena Infante on: 05/01/2021 12:28 PM   Modules accepted: Orders

## 2021-05-01 NOTE — Patient Instructions (Signed)

## 2021-05-01 NOTE — Progress Notes (Signed)
Subjective:    Patient ID: Kara Dean, female    DOB: April 12, 1933, 87 y.o.   MRN: [id number]   Chief Complaint: Medical Management of Chronic Issues    HPI:  1. Essential hypertension No c/o chest pain,sob or headache. Does not check blood pressure at home. BP Readings from Last 3 Encounters:  05/01/21 (!) 144/59  04/03/21 (!) 120/40  01/25/21 (!) 148/53     2. Type 2 diabetes mellitus treated with insulin (HCC) Fasting blood sugars are running 140-150. She denies any blood sugars below 80. Lab Results  Component Value Date   HGBA1C 7.2 (H) 01/25/2021     3. Hyperlipidemia, unspecified hyperlipidemia type Trying to watch diet but doing little to no exercise. Lab Results  Component Value Date   CHOL 130 01/25/2021   HDL 41 01/25/2021   LDLCALC 55 01/25/2021   TRIG 209 (H) 01/25/2021   CHOLHDL 3.2 01/25/2021     4. Gastroesophageal reflux disease without esophagitis She is on protonix and has no reflux symptoms  5. Chronic kidney disease (CKD) stage G3a/A3, moderately decreased glomerular filtration rate (GFR) between 45-59 mL/min/1.73 square meter and albuminuria creatinine ratio greater than 300 mg/g (HCC) Lab Results  Component Value Date   CREATININE 1.20 (H) 01/25/2021     6. Recurrent major depressive disorder, in full remission [facility]) She is on celexa daily and is doing well.  Depression screen [facility] 2/9 05/01/2021 02/19/2021 01/25/2021  Decreased Interest 1 0 3  Down, Depressed, Hopeless 1 0 3  PHQ - 2 Score 2 0 6  Altered sleeping 0 1 0  Tired, decreased energy _0 Change in appetite 1 0 1  Feeling bad or failure about yourself  1 0 1  Trouble concentrating _1 Moving slowly or fidgety/restless 0 0 0  Suicidal thoughts 0 0 0  PHQ-9 Score _2 Difficult doing work/chores Not difficult at all Somewhat difficult Not difficult at all  Some recent data might be hidden       7. GAD (generalized anxiety disorder) Is on lorazepam BID and  tat seems to hleps. Some days she does not take it. GAD 7 : Generalized Anxiety Score 05/01/2021 01/25/2021 08/21/2020 03/03/2020  Nervous, Anxious, on Edge _3 Control/stop worrying _4 Worry too much - different things _5 Trouble relaxing 1 1 0 1  Restless 0 0 0 0  Easily annoyed or irritable 0 0 0 0  Afraid - awful might happen 1 0 0 0  Total GAD 7 Score _6 Anxiety Difficulty Not difficult at all Not difficult at all Somewhat difficult Somewhat difficult      8. Peripheral edema Cardiologist told her to take lasix on m,w and f. Says that is working well for her.  9. Late effect of cerebrovascular accident (CVA) No permanent side effects  denies any weakness    Outpatient Encounter Medications as of 05/01/2021  Medication Sig   amLODipine (NORVASC) 10 MG tablet TAKE 1 TABLET ONCE DAILY   atorvastatin (LIPITOR) 10 MG tablet Take 1 tablet (10 mg total) by mouth daily. (NEEDS TO BE SEEN BEFORE NEXT REFILL)   B Complex Vitamins (B COMPLEX-B12) TABS Take 1 tablet by mouth daily.   Cholecalciferol (VITAMIN D3 PO) Take 1 capsule daily by mouth.   citalopram (CELEXA) 20 MG tablet Take 1 tablet (20 mg total) by mouth daily. (NEEDS TO  BE SEEN BEFORE NEXT REFILL)   dipyridamole-aspirin (AGGRENOX) 200-25 MG 12hr capsule TAKE  (1)  CAPSULE  TWICE DAILY.   fluticasone (FLONASE) 50 MCG/ACT nasal spray USE 2 SPRAYS IN EACH NOSTRIL ONCE DAILY.   furosemide (LASIX) 20 MG tablet Take 1 tablet (20 mg total) by mouth as directed. Mon, Wed, and Fri only.   glucose blood (ONETOUCH ULTRA) test strip Check BS up to 3 times daily Dx E11.9   LANTUS SOLOSTAR 100 UNIT/ML Solostar Pen Inject 44 Units into the skin daily.   LORazepam (ATIVAN) 0.5 MG tablet Take 1 tablet (0.5 mg total) by mouth 2 (two) times daily.   Magnesium 250 MG TABS Take 250 mg by mouth daily.   metoprolol succinate (TOPROL-XL) 100 MG 24 hr tablet TAKE 1 TABLET ONCE DAILY WITH A MEAL   olmesartan (BENICAR) 40 MG  tablet TAKE 1 TABLET ONCE DAILY   Omega-3 Fatty Acids (FISH OIL) 1000 MG CAPS Take 1 capsule by mouth daily.   pantoprazole (PROTONIX) 40 MG tablet Take 1 tablet (40 mg total) by mouth daily. (NEEDS TO BE SEEN BEFORE NEXT REFILL)   potassium chloride (KLOR-CON) 10 MEQ tablet Take 1 tablet (10 mEq total) by mouth as directed. Mon, Wed, and Fri only.   No facility-administered encounter medications on file as of 05/01/2021.    Past Surgical History:  Procedure Laterality Date   ABDOMINAL HYSTERECTOMY     BACK SURGERY  1994   CATARACT EXTRACTION W/PHACO Left 09/19/2017   Procedure: CATARACT EXTRACTION PHACO AND INTRAOCULAR LENS PLACEMENT ([place]);  Surgeon: Baruch Goldmann, MD;  Location: [facility];  Service: Ophthalmology;  Laterality: Left;  CDE: 8.10   CATARACT EXTRACTION W/PHACO Right 10/10/2017   Procedure: CATARACT EXTRACTION PHACO AND INTRAOCULAR LENS PLACEMENT RIGHT EYE;  Surgeon: Baruch Goldmann, MD;  Location: [facility];  Service: Ophthalmology;  Laterality: Right;  CDE: 9.60   COLON RESECTION  1990's   TUBAL LIGATION     VESICOVAGINAL FISTULA CLOSURE W/ TAH  1984    Family History  Problem Relation Age of Onset   Coronary artery disease Sister        stent x 3   Seizures Sister    Coronary artery disease Brother        stent   Hypertension Mother    Stroke Mother    Emphysema Father    Alcohol abuse Brother    Lung disease Brother    Heart attack Daughter     New complaints: None today  Social history: Lives with daughter.   Controlled substance contract: 08/28/20     Review of Systems  Constitutional:  Negative for diaphoresis.  Eyes:  Negative for pain.  Respiratory:  Negative for shortness of breath.   Cardiovascular:  Positive for leg swelling (occasionally). Negative for chest pain and palpitations.  Gastrointestinal:  Negative for abdominal pain.  Endocrine: Negative for polydipsia.  Skin:  Negative for rash.  Neurological:  Negative for dizziness, weakness and  headaches.  Hematological:  Does not bruise/bleed easily.  All other systems reviewed and are negative.     Objective:   Physical Exam Vitals and nursing note reviewed.  Constitutional:      General: She is not in acute distress.    Appearance: Normal appearance. She is well-developed.  HENT:     Head: Normocephalic.     Right Ear: Tympanic membrane normal.     Left Ear: Tympanic membrane normal.     Nose: Nose normal.     Mouth/Throat:  Mouth: Mucous membranes are moist.  Eyes:     Pupils: Pupils are equal, round, and reactive to light.  Neck:     Vascular: No carotid bruit or JVD.  Cardiovascular:     Rate and Rhythm: Normal rate and regular rhythm.     Heart sounds: Normal heart sounds.  Pulmonary:     Effort: Pulmonary effort is normal. No respiratory distress.     Breath sounds: Normal breath sounds. No wheezing or rales.  Chest:     Chest wall: No tenderness.  Abdominal:     General: Bowel sounds are normal. There is no distension or abdominal bruit.     Palpations: Abdomen is soft. There is no hepatomegaly, splenomegaly, mass or pulsatile mass.     Tenderness: There is no abdominal tenderness.  Musculoskeletal:        General: Normal range of motion.     Cervical back: Normal range of motion and neck supple.  Lymphadenopathy:     Cervical: No cervical adenopathy.  Skin:    General: Skin is warm and dry.  Neurological:     Mental Status: She is alert and oriented to person, place, and time.     Deep Tendon Reflexes: Reflexes are normal and symmetric.  Psychiatric:        Behavior: Behavior normal.        Thought Content: Thought content normal.        Judgment: Judgment normal.    BP 132/64 (BP Location: Left Arm, Cuff Size: Normal)   Pulse (!) 58   Temp 97.7 F (36.5 C) (Temporal)   Resp 20   Ht 5' (1.524 m)   Wt 124 lb (56.2 kg)   SpO2 97%   BMI 24.22 kg/m  Kara Dean comes in today with chief complaint of Medical Management of Chronic  Issues  Hgba1c 7.0%  Diagnosis and orders addressed:  1. Essential hypertension Low sodium diet - CBC with Differential/Platelet - CMP14+EGFR - potassium chloride (KLOR-CON) 10 MEQ tablet; Take 1 tablet (10 mEq total) by mouth as directed. Mon, Wed, and Fri only.  Dispense: 90 tablet; Refill: 1 - olmesartan (BENICAR) 40 MG tablet; Take 1 tablet (40 mg total) by mouth daily.  Dispense: 90 tablet; Refill: 1 - amLODipine (NORVASC) 10 MG tablet; Take 1 tablet (10 mg total) by mouth daily.  Dispense: 90 tablet; Refill: 1  2. Type 2 diabetes mellitus treated with insulin (HCC) Continue to wtach carbs in diet - Bayer DCA Hb A1c Waived  3. Hyperlipidemia, unspecified hyperlipidemia type Low fat diet - Lipid panel - atorvastatin (LIPITOR) 10 MG tablet; Take 1 tablet (10 mg total) by mouth daily. (NEEDS TO BE SEEN BEFORE NEXT REFILL)  Dispense: 90 tablet; Refill: 1  4. Gastroesophageal reflux disease without esophagitis Avoid spicy foods Do not eat 2 hours prior to bedtime - pantoprazole (PROTONIX) 40 MG tablet; Take 1 tablet (40 mg total) by mouth daily. (NEEDS TO BE SEEN BEFORE NEXT REFILL)  Dispense: 90 tablet; Refill: 1  5. Chronic kidney disease (CKD) stage G3a/A3, moderately decreased glomerular filtration rate (GFR) between 45-59 mL/min/1.73 square meter and albuminuria creatinine ratio greater than 300 mg/g (HCC) Labs pending  6. Recurrent major depressive disorder, in full remission ([place]) Stress management - citalopram (CELEXA) 20 MG tablet; Take 1 tablet (20 mg total) by mouth daily. (NEEDS TO BE SEEN BEFORE NEXT REFILL)  Dispense: 90 tablet; Refill: 1  7. GAD (generalized anxiety disorder) - LORazepam (ATIVAN) 0.5 MG tablet; Take 1  tablet (0.5 mg total) by mouth 2 (two) times daily.  Dispense: 60 tablet; Refill: 5  8. Peripheral edema Elevate legs when sitting - furosemide (LASIX) 20 MG tablet; Take 1 tablet (20 mg total) by mouth as directed. Mon, Wed, and Fri only.   Dispense: 90 tablet; Refill: 1  9. Late effect of cerebrovascular accident (CVA) Fall prevention  10. Coronary artery disease involving native coronary artery of native heart without angina pectoris - metoprolol succinate (TOPROL-XL) 100 MG 24 hr tablet; TAKE 1 TABLET ONCE DAILY WITH A MEAL  Dispense: 90 tablet; Refill: 1 - dipyridamole-aspirin (AGGRENOX) 200-25 MG 12hr capsule; TAKE  (1)  CAPSULE  TWICE DAILY.  Dispense: 180 capsule; Refill: 1   Labs pending Health Maintenance reviewed Diet and exercise encouraged  Follow up plan: 3 months   [name], FNP        Assessment & Plan:

## 2021-05-02 LAB — CMP14+EGFR
ALT: 9 IU/L (ref 0–32)
AST: 15 IU/L (ref 0–40)
Albumin/Globulin Ratio: 1.8 (ref 1.2–2.2)
Albumin: 4.5 g/dL (ref 3.6–4.6)
Alkaline Phosphatase: 76 IU/L (ref 44–121)
BUN/Creatinine Ratio: 14 (ref 12–28)
BUN: 20 mg/dL (ref 8–27)
Bilirubin Total: 0.5 mg/dL (ref 0.0–1.2)
CO2: 21 mmol/L (ref 20–29)
Calcium: 10.3 mg/dL (ref 8.7–10.3)
Chloride: 100 mmol/L (ref 96–106)
Creatinine, Ser: 1.39 mg/dL — ABNORMAL HIGH (ref 0.57–1.00)
Globulin, Total: 2.5 g/dL (ref 1.5–4.5)
Glucose: 167 mg/dL — ABNORMAL HIGH (ref 65–99)
Potassium: 5.1 mmol/L (ref 3.5–5.2)
Sodium: 141 mmol/L (ref 134–144)
Total Protein: 7 g/dL (ref 6.0–8.5)
eGFR: 37 mL/min/{1.73_m2} — ABNORMAL LOW (ref >59)

## 2021-05-02 LAB — LIPID PANEL
Chol/HDL Ratio: 2.6 ratio (ref 0.0–4.4)
Cholesterol, Total: 109 mg/dL (ref 100–199)
HDL: 42 mg/dL (ref >39)
LDL Chol Calc (NIH): 40 mg/dL (ref 0–99)
Triglycerides: 161 mg/dL — ABNORMAL HIGH (ref 0–149)
VLDL Cholesterol Cal: 27 mg/dL (ref 5–40)

## 2021-05-02 LAB — CBC WITH DIFFERENTIAL/PLATELET
Basophils Absolute: 0 10*3/uL (ref 0.0–0.2)
Basos: 0 %
EOS (ABSOLUTE): 0.3 10*3/uL (ref 0.0–0.4)
Eos: 3 %
Hematocrit: 37.6 % (ref 34.0–46.6)
Hemoglobin: 12.4 g/dL (ref 11.1–15.9)
Immature Grans (Abs): 0 10*3/uL (ref 0.0–0.1)
Immature Granulocytes: 0 %
Lymphocytes Absolute: 1.4 10*3/uL (ref 0.7–3.1)
Lymphs: 15 %
MCH: 30.5 pg (ref 26.6–33.0)
MCHC: 33 g/dL (ref 31.5–35.7)
MCV: 93 fL (ref 79–97)
Monocytes Absolute: 0.7 10*3/uL (ref 0.1–0.9)
Monocytes: 8 %
Neutrophils Absolute: 6.5 10*3/uL (ref 1.4–7.0)
Neutrophils: 74 %
Platelets: 180 10*3/uL (ref 150–450)
RBC: 4.06 x10E6/uL (ref 3.77–5.28)
RDW: 12.8 % (ref 11.7–15.4)
WBC: 9 10*3/uL (ref 3.4–10.8)

## 2021-05-06 ENCOUNTER — Emergency Department (HOSPITAL_COMMUNITY): Payer: Medicare Other

## 2021-05-06 ENCOUNTER — Inpatient Hospital Stay (HOSPITAL_COMMUNITY)
Admission: EM | Admit: 2021-05-06 | Discharge: 2021-05-12 | DRG: 418 | Disposition: A | Discharge status: Home Health | Payer: Medicare Other | Attending: Internal Medicine | Admitting: Internal Medicine

## 2021-05-06 DIAGNOSIS — H9190 Unspecified hearing loss, unspecified ear: Secondary | ICD-10-CM | POA: Diagnosis present

## 2021-05-06 DIAGNOSIS — K219 Gastro-esophageal reflux disease without esophagitis: Secondary | ICD-10-CM | POA: Diagnosis not present

## 2021-05-06 DIAGNOSIS — I6523 Occlusion and stenosis of bilateral carotid arteries: Secondary | ICD-10-CM | POA: Diagnosis present

## 2021-05-06 DIAGNOSIS — I5032 Chronic diastolic (congestive) heart failure: Secondary | ICD-10-CM | POA: Diagnosis not present

## 2021-05-06 DIAGNOSIS — M1990 Unspecified osteoarthritis, unspecified site: Secondary | ICD-10-CM | POA: Diagnosis present

## 2021-05-06 DIAGNOSIS — R1110 Vomiting, unspecified: Secondary | ICD-10-CM | POA: Diagnosis not present

## 2021-05-06 DIAGNOSIS — Z8673 Personal history of transient ischemic attack (TIA), and cerebral infarction without residual deficits: Secondary | ICD-10-CM | POA: Diagnosis not present

## 2021-05-06 DIAGNOSIS — R109 Unspecified abdominal pain: Secondary | ICD-10-CM

## 2021-05-06 DIAGNOSIS — K8050 Calculus of bile duct without cholangitis or cholecystitis without obstruction: Secondary | ICD-10-CM | POA: Diagnosis not present

## 2021-05-06 DIAGNOSIS — E1169 Type 2 diabetes mellitus with other specified complication: Secondary | ICD-10-CM | POA: Diagnosis present

## 2021-05-06 DIAGNOSIS — Z85828 Personal history of other malignant neoplasm of skin: Secondary | ICD-10-CM | POA: Diagnosis not present

## 2021-05-06 DIAGNOSIS — Z419 Encounter for procedure for purposes other than remedying health state, unspecified: Secondary | ICD-10-CM

## 2021-05-06 DIAGNOSIS — K660 Peritoneal adhesions (postprocedural) (postinfection): Secondary | ICD-10-CM | POA: Diagnosis present

## 2021-05-06 DIAGNOSIS — N179 Acute kidney failure, unspecified: Secondary | ICD-10-CM | POA: Diagnosis present

## 2021-05-06 DIAGNOSIS — Z794 Long term (current) use of insulin: Secondary | ICD-10-CM

## 2021-05-06 DIAGNOSIS — R7989 Other specified abnormal findings of blood chemistry: Secondary | ICD-10-CM

## 2021-05-06 DIAGNOSIS — R188 Other ascites: Secondary | ICD-10-CM | POA: Diagnosis present

## 2021-05-06 DIAGNOSIS — E119 Type 2 diabetes mellitus without complications: Secondary | ICD-10-CM

## 2021-05-06 DIAGNOSIS — Z823 Family history of stroke: Secondary | ICD-10-CM

## 2021-05-06 DIAGNOSIS — K8012 Calculus of gallbladder with acute and chronic cholecystitis without obstruction: Secondary | ICD-10-CM | POA: Diagnosis not present

## 2021-05-06 DIAGNOSIS — I130 Hypertensive heart and chronic kidney disease with heart failure and stage 1 through stage 4 chronic kidney disease, or unspecified chronic kidney disease: Secondary | ICD-10-CM | POA: Diagnosis present

## 2021-05-06 DIAGNOSIS — N1831 Chronic kidney disease, stage 3a: Secondary | ICD-10-CM | POA: Diagnosis present

## 2021-05-06 DIAGNOSIS — E1122 Type 2 diabetes mellitus with diabetic chronic kidney disease: Secondary | ICD-10-CM | POA: Diagnosis not present

## 2021-05-06 DIAGNOSIS — R112 Nausea with vomiting, unspecified: Secondary | ICD-10-CM | POA: Diagnosis not present

## 2021-05-06 DIAGNOSIS — Z882 Allergy status to sulfonamides status: Secondary | ICD-10-CM

## 2021-05-06 DIAGNOSIS — E11649 Type 2 diabetes mellitus with hypoglycemia without coma: Secondary | ICD-10-CM | POA: Diagnosis not present

## 2021-05-06 DIAGNOSIS — E1142 Type 2 diabetes mellitus with diabetic polyneuropathy: Secondary | ICD-10-CM

## 2021-05-06 DIAGNOSIS — R7401 Elevation of levels of liver transaminase levels: Secondary | ICD-10-CM | POA: Diagnosis not present

## 2021-05-06 DIAGNOSIS — Z885 Allergy status to narcotic agent status: Secondary | ICD-10-CM

## 2021-05-06 DIAGNOSIS — F32A Depression, unspecified: Secondary | ICD-10-CM | POA: Diagnosis present

## 2021-05-06 DIAGNOSIS — Z20822 Contact with and (suspected) exposure to covid-19: Secondary | ICD-10-CM | POA: Diagnosis not present

## 2021-05-06 DIAGNOSIS — R1084 Generalized abdominal pain: Secondary | ICD-10-CM | POA: Diagnosis not present

## 2021-05-06 DIAGNOSIS — I10 Essential (primary) hypertension: Secondary | ICD-10-CM | POA: Diagnosis not present

## 2021-05-06 DIAGNOSIS — K810 Acute cholecystitis: Secondary | ICD-10-CM | POA: Diagnosis not present

## 2021-05-06 DIAGNOSIS — R1011 Right upper quadrant pain: Secondary | ICD-10-CM | POA: Diagnosis not present

## 2021-05-06 DIAGNOSIS — Z79899 Other long term (current) drug therapy: Secondary | ICD-10-CM

## 2021-05-06 DIAGNOSIS — Z9049 Acquired absence of other specified parts of digestive tract: Secondary | ICD-10-CM

## 2021-05-06 DIAGNOSIS — K8000 Calculus of gallbladder with acute cholecystitis without obstruction: Secondary | ICD-10-CM | POA: Diagnosis not present

## 2021-05-06 DIAGNOSIS — K8042 Calculus of bile duct with acute cholecystitis without obstruction: Secondary | ICD-10-CM | POA: Diagnosis not present

## 2021-05-06 DIAGNOSIS — R1010 Upper abdominal pain, unspecified: Secondary | ICD-10-CM | POA: Diagnosis not present

## 2021-05-06 DIAGNOSIS — E782 Mixed hyperlipidemia: Secondary | ICD-10-CM | POA: Diagnosis present

## 2021-05-06 DIAGNOSIS — Z888 Allergy status to other drugs, medicaments and biological substances status: Secondary | ICD-10-CM

## 2021-05-06 DIAGNOSIS — F411 Generalized anxiety disorder: Secondary | ICD-10-CM | POA: Diagnosis not present

## 2021-05-06 DIAGNOSIS — Z825 Family history of asthma and other chronic lower respiratory diseases: Secondary | ICD-10-CM

## 2021-05-06 DIAGNOSIS — N1832 Chronic kidney disease, stage 3b: Secondary | ICD-10-CM | POA: Diagnosis present

## 2021-05-06 DIAGNOSIS — Z8249 Family history of ischemic heart disease and other diseases of the circulatory system: Secondary | ICD-10-CM | POA: Diagnosis not present

## 2021-05-06 DIAGNOSIS — R932 Abnormal findings on diagnostic imaging of liver and biliary tract: Secondary | ICD-10-CM | POA: Diagnosis not present

## 2021-05-06 DIAGNOSIS — Z01810 Encounter for preprocedural cardiovascular examination: Secondary | ICD-10-CM | POA: Diagnosis not present

## 2021-05-06 DIAGNOSIS — R531 Weakness: Secondary | ICD-10-CM | POA: Diagnosis not present

## 2021-05-06 DIAGNOSIS — K8020 Calculus of gallbladder without cholecystitis without obstruction: Secondary | ICD-10-CM | POA: Diagnosis not present

## 2021-05-06 DIAGNOSIS — Z91040 Latex allergy status: Secondary | ICD-10-CM

## 2021-05-06 DIAGNOSIS — Z8719 Personal history of other diseases of the digestive system: Secondary | ICD-10-CM

## 2021-05-06 LAB — RESP PANEL BY RT-PCR (FLU A&B, COVID) ARPGX2
Influenza A by PCR: NEGATIVE
Influenza B by PCR: NEGATIVE
SARS Coronavirus 2 by RT PCR: NEGATIVE

## 2021-05-06 LAB — CBC WITH DIFFERENTIAL/PLATELET
Abs Immature Granulocytes: 0.04 10*3/uL (ref 0.00–0.07)
Basophils Absolute: 0 10*3/uL (ref 0.0–0.1)
Basophils Relative: 0 %
Eosinophils Absolute: 0.1 10*3/uL (ref 0.0–0.5)
Eosinophils Relative: 1 %
HCT: 35.4 % — ABNORMAL LOW (ref 36.0–46.0)
Hemoglobin: 11.4 g/dL — ABNORMAL LOW (ref 12.0–15.0)
Immature Granulocytes: 0 %
Lymphocytes Relative: 6 %
Lymphs Abs: 0.8 10*3/uL (ref 0.7–4.0)
MCH: 30.8 pg (ref 26.0–34.0)
MCHC: 32.2 g/dL (ref 30.0–36.0)
MCV: 95.7 fL (ref 80.0–100.0)
Monocytes Absolute: 0.4 10*3/uL (ref 0.1–1.0)
Monocytes Relative: 4 %
Neutro Abs: 11.1 10*3/uL — ABNORMAL HIGH (ref 1.7–7.7)
Neutrophils Relative %: 89 %
Platelets: 192 10*3/uL (ref 150–400)
RBC: 3.7 MIL/uL — ABNORMAL LOW (ref 3.87–5.11)
RDW: 12.6 % (ref 11.5–15.5)
WBC: 12.5 10*3/uL — ABNORMAL HIGH (ref 4.0–10.5)
nRBC: 0 % (ref 0.0–0.2)

## 2021-05-06 LAB — TROPONIN I (HIGH SENSITIVITY): Troponin I (High Sensitivity): 9 ng/L (ref <18)

## 2021-05-06 LAB — COMPREHENSIVE METABOLIC PANEL
ALT: 52 U/L — ABNORMAL HIGH (ref 0–44)
AST: 114 U/L — ABNORMAL HIGH (ref 15–41)
Albumin: 3.6 g/dL (ref 3.5–5.0)
Alkaline Phosphatase: 76 U/L (ref 38–126)
Anion gap: 8 (ref 5–15)
BUN: 18 mg/dL (ref 8–23)
CO2: 25 mmol/L (ref 22–32)
Calcium: 9.6 mg/dL (ref 8.9–10.3)
Chloride: 106 mmol/L (ref 98–111)
Creatinine, Ser: 1.2 mg/dL — ABNORMAL HIGH (ref 0.44–1.00)
GFR, Estimated: 44 mL/min — ABNORMAL LOW (ref >60)
Glucose, Bld: 157 mg/dL — ABNORMAL HIGH (ref 70–99)
Potassium: 4.2 mmol/L (ref 3.5–5.1)
Sodium: 139 mmol/L (ref 135–145)
Total Bilirubin: 1.6 mg/dL — ABNORMAL HIGH (ref 0.3–1.2)
Total Protein: 6.8 g/dL (ref 6.5–8.1)

## 2021-05-06 LAB — TYPE AND SCREEN
ABO/RH(D): A NEG
Antibody Screen: NEGATIVE

## 2021-05-06 LAB — LACTIC ACID, PLASMA: Lactic Acid, Venous: 1.3 mmol/L (ref 0.5–1.9)

## 2021-05-06 LAB — LIPASE, BLOOD: Lipase: 31 U/L (ref 11–51)

## 2021-05-06 LAB — PROTIME-INR
INR: 1.1 (ref 0.8–1.2)
Prothrombin Time: 14.5 seconds (ref 11.4–15.2)

## 2021-05-06 MED ORDER — METRONIDAZOLE 500 MG/100ML IV SOLN
500.0000 mg | Freq: Once | INTRAVENOUS | Status: AC
  Administered 2021-05-06: 500 mg via INTRAVENOUS
  Filled 2021-05-06: qty 100

## 2021-05-06 MED ORDER — SODIUM CHLORIDE 0.9 % IV SOLN
1.0000 g | Freq: Once | INTRAVENOUS | Status: AC
  Administered 2021-05-06: 1 g via INTRAVENOUS
  Filled 2021-05-06: qty 10

## 2021-05-06 MED ORDER — IOHEXOL 300 MG/ML  SOLN
80.0000 mL | Freq: Once | INTRAMUSCULAR | Status: AC | PRN
  Administered 2021-05-06: 80 mL via INTRAVENOUS

## 2021-05-06 NOTE — ED Notes (Signed)
Patient transported to CT 

## 2021-05-06 NOTE — ED Triage Notes (Signed)
Pt  bib EMS from home c/o epigastric pain began last night. Endorses N/V without ability to keep down any fluid/food. 61m zofran an 50106mNS given by EMS  EMS vitals: HR 46-55 150/80 CBG 128

## 2021-05-06 NOTE — ED Provider Notes (Signed)
[facility] EMERGENCY DEPARTMENT Provider Note   CSN: [id number] Arrival date & time: 05/06/21  2024     History No chief complaint on file.   Kara Dean is a 87 y.o. female.  87 year old female with prior medical history as detailed below presents for evaluation by patient complains of upper abdominal discomfort and associated nausea and vomiting.  Symptoms began early this morning.  She reports multiple episodes of emesis over the course of the day.  She denies associated fever.  She denies diarrhea.   Prior history includes history of Crohn's colitis with multiple prior abdominal surgeries in the past.  The history is provided by the patient and medical records.  Abdominal Pain Pain location:  Epigastric and RUQ Pain quality: aching and cramping   Pain radiates to:  Does not radiate Pain severity:  Moderate Onset quality:  Sudden     Past Medical History:  Diagnosis Date   Anxiety    Carotid artery disease ([place])    Carotid US 11/2018:  bilateral ICA 1-39 >> repeat 2 years.    Cataract    Crohn's colitis ([place])    CVA (cerebral infarction)    No deficits   Depression    Diabetes mellitus    type 2   Dyslipidemia    GERD (gastroesophageal reflux disease)    History of TIAs    no deficits   Hyperlipidemia    Hypertension    Hyponatremia    Nodular basal cell carcinoma (BCC) 04/22/2018   left prox, nasal root (tx p bx)   Osteoarthritis    SCC (squamous cell carcinoma) 04/22/2018   left shin,superior-(tx p bx), Left shin,inferior-(tx p bx )    Patient Active Problem List   Diagnosis Date Noted   Peripheral edema 03/18/2017   Chronic kidney disease (CKD) stage G3a/A3, moderately decreased glomerular filtration rate (GFR) between 45-59 mL/min/1.73 square meter and albuminuria creatinine ratio greater than 300 mg/g ([place]) 07/27/2014   Gastroesophageal reflux disease without esophagitis 04/27/2014   GAD (generalized anxiety disorder) 04/27/2014    Late effect of cerebrovascular accident (CVA) 03/26/2010   Hyperlipidemia 02/01/2009   Depression 02/01/2009   Essential hypertension 02/01/2009   Osteoarthritis 02/01/2009   Type 2 diabetes mellitus treated with insulin ([place]) 02/01/2009    Past Surgical History:  Procedure Laterality Date   ABDOMINAL HYSTERECTOMY     BACK SURGERY  1994   CATARACT EXTRACTION W/PHACO Left 09/19/2017   Procedure: CATARACT EXTRACTION PHACO AND INTRAOCULAR LENS PLACEMENT ([place]);  Surgeon: Baruch Goldmann, MD;  Location: [facility];  Service: Ophthalmology;  Laterality: Left;  CDE: 8.10   CATARACT EXTRACTION W/PHACO Right 10/10/2017   Procedure: CATARACT EXTRACTION PHACO AND INTRAOCULAR LENS PLACEMENT RIGHT EYE;  Surgeon: Baruch Goldmann, MD;  Location: [facility];  Service: Ophthalmology;  Laterality: Right;  CDE: 9.60   COLON RESECTION  1990's   TUBAL LIGATION     VESICOVAGINAL FISTULA CLOSURE W/ TAH  1984     OB History   No obstetric history on file.     Family History  Problem Relation Age of Onset   Coronary artery disease Sister        stent x 3   Seizures Sister    Coronary artery disease Brother        stent   Hypertension Mother    Stroke Mother    Emphysema Father    Alcohol abuse Brother    Lung disease Brother    Heart attack Daughter  Social History   Tobacco Use   Smoking status: Never   Smokeless tobacco: Never  Vaping Use   Vaping Use: Never used  Substance Use Topics   Alcohol use: No   Drug use: No    Home Medications Prior to Admission medications   Medication Sig Start Date End Date Taking? Authorizing Provider  amLODipine (NORVASC) 10 MG tablet Take 1 tablet (10 mg total) by mouth daily. 05/01/21   Hassell Done, Mary-Margaret, FNP  atorvastatin (LIPITOR) 10 MG tablet Take 1 tablet (10 mg total) by mouth daily. (NEEDS TO BE SEEN BEFORE NEXT REFILL) 05/01/21   [name], FNP  B Complex Vitamins (B COMPLEX-B12) TABS Take 1 tablet by mouth daily.    [provider]  Cholecalciferol (VITAMIN D3 PO) Take 1 capsule daily by mouth.    [provider]  citalopram (CELEXA) 20 MG tablet Take 1 tablet (20 mg total) by mouth daily. (NEEDS TO BE SEEN BEFORE NEXT REFILL) 05/01/21   Hassell Done, Mary-Margaret, FNP  dipyridamole-aspirin (AGGRENOX) 200-25 MG 12hr capsule TAKE  (1)  CAPSULE  TWICE DAILY. 05/01/21   Hassell Done, Mary-Margaret, FNP  fluticasone (FLONASE) 50 MCG/ACT nasal spray USE 2 SPRAYS IN EACH NOSTRIL ONCE DAILY. 10/13/20   Hassell Done, Mary-Margaret, FNP  furosemide (LASIX) 20 MG tablet Take 1 tablet (20 mg total) by mouth as directed. Mon, Wed, and Fri only. 05/01/21   Hassell Done, Mary-Margaret, FNP  glucose blood (ONETOUCH ULTRA) test strip Check BS up to 3 times daily Dx E11.9 10/13/20   Hassell Done, Mary-Margaret, FNP  LANTUS SOLOSTAR 100 UNIT/ML Solostar Pen Inject 44 Units into the skin daily. 02/26/21   [provider]  LORazepam (ATIVAN) 0.5 MG tablet Take 1 tablet (0.5 mg total) by mouth 2 (two) times daily. 05/01/21   Hassell Done Mary-Margaret, FNP  Magnesium 250 MG TABS Take 250 mg by mouth daily.    [provider]  metoprolol succinate (TOPROL-XL) 100 MG 24 hr tablet TAKE 1 TABLET ONCE DAILY WITH A MEAL 05/01/21   Hassell Done, Mary-Margaret, FNP  olmesartan (BENICAR) 40 MG tablet Take 1 tablet (40 mg total) by mouth daily. 05/01/21   Hassell Done Mary-Margaret, FNP  Omega-3 Fatty Acids (FISH OIL) 1000 MG CAPS Take 1 capsule by mouth daily.    [provider]  pantoprazole (PROTONIX) 40 MG tablet Take 1 tablet (40 mg total) by mouth daily. (NEEDS TO BE SEEN BEFORE NEXT REFILL) 05/01/21   Hassell Done, Mary-Margaret, FNP  potassium chloride (KLOR-CON) 10 MEQ tablet Take 1 tablet (10 mEq total) by mouth as directed. Mon, Wed, and Fri only. 05/01/21   [name], FNP    Allergies    Codeine, Latex, Morphine, Niacin, Sulfa antibiotics, and Sulfonamide derivatives  Review of Systems   Review of Systems  Gastrointestinal:  Positive for  abdominal pain.  All other systems reviewed and are negative.  Physical Exam Updated Vital Signs BP (!) 170/49   Pulse (!) 57   Temp 97.9 F (36.6 C) (Oral)   Resp 17   Ht 5' (1.524 m)   Wt 56.2 kg   SpO2 95%   BMI 24.22 kg/m   Physical Exam Vitals and nursing note reviewed.  Constitutional:      General: She is not in acute distress.    Appearance: Normal appearance. She is well-developed.  HENT:     Head: Normocephalic and atraumatic.  Eyes:     Conjunctiva/sclera: Conjunctivae normal.     Pupils: Pupils are equal, round, and reactive to light.  Cardiovascular:  Rate and Rhythm: Normal rate and regular rhythm.     Heart sounds: Normal heart sounds.  Pulmonary:     Effort: Pulmonary effort is normal. No respiratory distress.     Breath sounds: Normal breath sounds.  Abdominal:     General: There is no distension.     Palpations: Abdomen is soft.     Tenderness: There is abdominal tenderness.     Comments: Mild RUQ and epigastric tenderness  Musculoskeletal:        General: No deformity. Normal range of motion.     Cervical back: Normal range of motion and neck supple.  Skin:    General: Skin is warm and dry.  Neurological:     General: No focal deficit present.     Mental Status: She is alert and oriented to person, place, and time.    ED Results / Procedures / Treatments   Labs (all labs ordered are listed, but only abnormal results are displayed) Labs Reviewed  COMPREHENSIVE METABOLIC PANEL - Abnormal; Notable for the following components:      Result Value   Glucose, Bld 157 (*)    Creatinine, Ser 1.20 (*)    AST 114 (*)    ALT 52 (*)    Total Bilirubin 1.6 (*)    GFR, Estimated 44 (*)    All other components within normal limits  CBC WITH DIFFERENTIAL/PLATELET - Abnormal; Notable for the following components:   WBC 12.5 (*)    RBC 3.70 (*)    Hemoglobin 11.4 (*)    HCT 35.4 (*)    Neutro Abs 11.1 (*)    All other components within normal  limits  RESP PANEL BY RT-PCR (FLU A&B, COVID) ARPGX2  LIPASE, BLOOD  LACTIC ACID, PLASMA  PROTIME-INR  LACTIC ACID, PLASMA  TYPE AND SCREEN  ABO/RH  TROPONIN I (HIGH SENSITIVITY)  TROPONIN I (HIGH SENSITIVITY)    EKG EKG Interpretation  Date/Time:  Sunday May 06 2021 20:30:55 EDT Ventricular Rate:  56 PR Interval:  208 QRS Duration: 116 QT Interval:  510 QTC Calculation: 493 R Axis:   -47 Text Interpretation: Sinus rhythm Incomplete left bundle branch block Left ventricular hypertrophy Anterior Q waves, possibly due to LVH Confirmed by Dene Gentry [phone]) on 05/06/2021 9:21:08 PM  Radiology CT ABDOMEN PELVIS W CONTRAST  Result Date: 05/06/2021 CLINICAL DATA:  Epigastric pain, nausea, and vomiting. History of Crohn's colitis. EXAM: CT ABDOMEN AND PELVIS WITH CONTRAST TECHNIQUE: Multidetector CT imaging of the abdomen and pelvis was performed using the standard protocol following bolus administration of intravenous contrast. CONTRAST:  30m OMNIPAQUE IOHEXOL 300 MG/ML  SOLN COMPARISON:  None. FINDINGS: Lower chest: Coarse interstitial scarring in the lung bases. Coronary artery calcifications. Small esophageal hiatal hernia. Hepatobiliary: Cholelithiasis with distended gallbladder. Moderate intrahepatic and extrahepatic bile duct dilatation. A tiny calcification is suggested in the distal common bile duct suggesting a common duct stone. Pancreas: Unremarkable. No pancreatic ductal dilatation or surrounding inflammatory changes. Spleen: Normal in size without focal abnormality. Adrenals/Urinary Tract: No adrenal gland nodules. Small parenchymal cysts in both kidneys. Scarring in the upper pole of the right kidney. Nephrograms are otherwise symmetrical and homogeneous. No hydronephrosis or hydroureter. Bladder is unremarkable. Stomach/Bowel: Stomach, small bowel, and colon are not abnormally distended. Mild wall thickening in the rectosigmoid and descending colon likely related to the  patient's history of colitis. No pericolonic inflammatory changes or abscess is identified. Appendix is not visualized. Vascular/Lymphatic: Diffuse calcification of the abdominal aorta and celiac/mesenteric branch  vessels. No aneurysm. Reproductive: Prostate is unremarkable. Other: No free air or free fluid in the abdomen. Abdominal wall musculature appears intact. Musculoskeletal: Degenerative changes in the lumbar spine. No destructive bone lesions. IMPRESSION: 1. Cholelithiasis and distended gallbladder. Bile duct dilatation with probable common bile duct stone. 2. Coarse interstitial fibrosis in the lung bases. 3. Small esophageal hiatal hernia. 4. Aortic atherosclerosis. 5. Mild diffuse wall thickening of the left colon consistent with patient's history of colitis. Electronically Signed   By: Lucienne Capers M.D.   On: 05/06/2021 22:24    Procedures Procedures   Medications Ordered in ED Medications  iohexol (OMNIPAQUE) 300 MG/ML solution 80 mL (80 mLs Intravenous Contrast Given 05/06/21 2209)    ED Course  I have reviewed the triage vital signs and the nursing notes.  Pertinent labs & imaging results that were available during my care of the patient were reviewed by me and considered in my medical decision making (see chart for details).    MDM Rules/Calculators/A&P                          MDM  MSE complete  Kara Dean was evaluated in Emergency Department on 05/07/2021 for the symptoms described in the history of present illness. She was evaluated in the context of the global COVID-19 pandemic, which necessitated consideration that the patient might be at risk for infection with the SARS-CoV-2 virus that causes COVID-19. Institutional protocols and algorithms that pertain to the evaluation of patients at risk for COVID-19 are in a state of rapid change based on information released by regulatory bodies including the CDC and federal and state organizations. These policies and  algorithms were followed during the patient's care in the ED.  Patient presents with upper abdominal pain and associated nausea and vomiting.  Initial labs and CT suggestive of possible CDB stone.  Pending RUQ [place] and disposition signed out to Dr. Betsey Holiday.    Final Clinical Impression(s) / ED Diagnoses Final diagnoses:  Abdominal pain  Choledocholithiasis with acute cholecystitis    Rx / DC Orders ED Discharge Orders     None        Valarie Merino, MD 05/07/21 [phone]

## 2021-05-07 ENCOUNTER — Inpatient Hospital Stay (HOSPITAL_COMMUNITY): Payer: Medicare Other

## 2021-05-07 ENCOUNTER — Other Ambulatory Visit: Payer: Self-pay

## 2021-05-07 ENCOUNTER — Encounter (HOSPITAL_COMMUNITY): Payer: Self-pay | Admitting: Internal Medicine

## 2021-05-07 DIAGNOSIS — Z8673 Personal history of transient ischemic attack (TIA), and cerebral infarction without residual deficits: Secondary | ICD-10-CM | POA: Diagnosis not present

## 2021-05-07 DIAGNOSIS — I5032 Chronic diastolic (congestive) heart failure: Secondary | ICD-10-CM | POA: Diagnosis not present

## 2021-05-07 DIAGNOSIS — K810 Acute cholecystitis: Secondary | ICD-10-CM | POA: Diagnosis present

## 2021-05-07 DIAGNOSIS — E782 Mixed hyperlipidemia: Secondary | ICD-10-CM | POA: Diagnosis not present

## 2021-05-07 DIAGNOSIS — K8000 Calculus of gallbladder with acute cholecystitis without obstruction: Secondary | ICD-10-CM | POA: Diagnosis not present

## 2021-05-07 DIAGNOSIS — I6523 Occlusion and stenosis of bilateral carotid arteries: Secondary | ICD-10-CM | POA: Diagnosis not present

## 2021-05-07 DIAGNOSIS — N1831 Chronic kidney disease, stage 3a: Secondary | ICD-10-CM | POA: Diagnosis not present

## 2021-05-07 DIAGNOSIS — K660 Peritoneal adhesions (postprocedural) (postinfection): Secondary | ICD-10-CM | POA: Diagnosis present

## 2021-05-07 DIAGNOSIS — Z85828 Personal history of other malignant neoplasm of skin: Secondary | ICD-10-CM | POA: Diagnosis not present

## 2021-05-07 DIAGNOSIS — K8010 Calculus of gallbladder with chronic cholecystitis without obstruction: Secondary | ICD-10-CM | POA: Diagnosis not present

## 2021-05-07 DIAGNOSIS — N179 Acute kidney failure, unspecified: Secondary | ICD-10-CM | POA: Diagnosis not present

## 2021-05-07 DIAGNOSIS — M1990 Unspecified osteoarthritis, unspecified site: Secondary | ICD-10-CM | POA: Diagnosis present

## 2021-05-07 DIAGNOSIS — E11649 Type 2 diabetes mellitus with hypoglycemia without coma: Secondary | ICD-10-CM | POA: Diagnosis not present

## 2021-05-07 DIAGNOSIS — K219 Gastro-esophageal reflux disease without esophagitis: Secondary | ICD-10-CM | POA: Diagnosis not present

## 2021-05-07 DIAGNOSIS — K7689 Other specified diseases of liver: Secondary | ICD-10-CM | POA: Diagnosis not present

## 2021-05-07 DIAGNOSIS — E1122 Type 2 diabetes mellitus with diabetic chronic kidney disease: Secondary | ICD-10-CM

## 2021-05-07 DIAGNOSIS — K862 Cyst of pancreas: Secondary | ICD-10-CM | POA: Diagnosis not present

## 2021-05-07 DIAGNOSIS — Z794 Long term (current) use of insulin: Secondary | ICD-10-CM

## 2021-05-07 DIAGNOSIS — R7401 Elevation of levels of liver transaminase levels: Secondary | ICD-10-CM | POA: Diagnosis present

## 2021-05-07 DIAGNOSIS — Z01810 Encounter for preprocedural cardiovascular examination: Secondary | ICD-10-CM | POA: Diagnosis not present

## 2021-05-07 DIAGNOSIS — K8012 Calculus of gallbladder with acute and chronic cholecystitis without obstruction: Secondary | ICD-10-CM | POA: Diagnosis not present

## 2021-05-07 DIAGNOSIS — E1169 Type 2 diabetes mellitus with other specified complication: Secondary | ICD-10-CM | POA: Diagnosis present

## 2021-05-07 DIAGNOSIS — F411 Generalized anxiety disorder: Secondary | ICD-10-CM

## 2021-05-07 DIAGNOSIS — Z01818 Encounter for other preprocedural examination: Secondary | ICD-10-CM | POA: Diagnosis not present

## 2021-05-07 DIAGNOSIS — F32A Depression, unspecified: Secondary | ICD-10-CM | POA: Diagnosis not present

## 2021-05-07 DIAGNOSIS — H9190 Unspecified hearing loss, unspecified ear: Secondary | ICD-10-CM | POA: Diagnosis present

## 2021-05-07 DIAGNOSIS — R932 Abnormal findings on diagnostic imaging of liver and biliary tract: Secondary | ICD-10-CM

## 2021-05-07 DIAGNOSIS — K8042 Calculus of bile duct with acute cholecystitis without obstruction: Secondary | ICD-10-CM | POA: Diagnosis not present

## 2021-05-07 DIAGNOSIS — I10 Essential (primary) hypertension: Secondary | ICD-10-CM | POA: Diagnosis not present

## 2021-05-07 DIAGNOSIS — Z8249 Family history of ischemic heart disease and other diseases of the circulatory system: Secondary | ICD-10-CM | POA: Diagnosis not present

## 2021-05-07 DIAGNOSIS — R1010 Upper abdominal pain, unspecified: Secondary | ICD-10-CM | POA: Diagnosis not present

## 2021-05-07 DIAGNOSIS — K8050 Calculus of bile duct without cholangitis or cholecystitis without obstruction: Secondary | ICD-10-CM | POA: Diagnosis not present

## 2021-05-07 DIAGNOSIS — E119 Type 2 diabetes mellitus without complications: Secondary | ICD-10-CM | POA: Diagnosis not present

## 2021-05-07 DIAGNOSIS — R7989 Other specified abnormal findings of blood chemistry: Secondary | ICD-10-CM | POA: Diagnosis not present

## 2021-05-07 DIAGNOSIS — I129 Hypertensive chronic kidney disease with stage 1 through stage 4 chronic kidney disease, or unspecified chronic kidney disease: Secondary | ICD-10-CM | POA: Diagnosis not present

## 2021-05-07 DIAGNOSIS — I130 Hypertensive heart and chronic kidney disease with heart failure and stage 1 through stage 4 chronic kidney disease, or unspecified chronic kidney disease: Secondary | ICD-10-CM | POA: Diagnosis not present

## 2021-05-07 DIAGNOSIS — N281 Cyst of kidney, acquired: Secondary | ICD-10-CM | POA: Diagnosis not present

## 2021-05-07 DIAGNOSIS — K8020 Calculus of gallbladder without cholecystitis without obstruction: Secondary | ICD-10-CM | POA: Diagnosis not present

## 2021-05-07 DIAGNOSIS — Z823 Family history of stroke: Secondary | ICD-10-CM | POA: Diagnosis not present

## 2021-05-07 DIAGNOSIS — R188 Other ascites: Secondary | ICD-10-CM | POA: Diagnosis not present

## 2021-05-07 DIAGNOSIS — Z20822 Contact with and (suspected) exposure to covid-19: Secondary | ICD-10-CM | POA: Diagnosis not present

## 2021-05-07 DIAGNOSIS — R1011 Right upper quadrant pain: Secondary | ICD-10-CM | POA: Diagnosis not present

## 2021-05-07 DIAGNOSIS — Z79899 Other long term (current) drug therapy: Secondary | ICD-10-CM | POA: Diagnosis not present

## 2021-05-07 LAB — CBC WITH DIFFERENTIAL/PLATELET
Abs Immature Granulocytes: 0.02 10*3/uL (ref 0.00–0.07)
Basophils Absolute: 0 10*3/uL (ref 0.0–0.1)
Basophils Relative: 0 %
Eosinophils Absolute: 0 10*3/uL (ref 0.0–0.5)
Eosinophils Relative: 0 %
HCT: 38.8 % (ref 36.0–46.0)
Hemoglobin: 12.5 g/dL (ref 12.0–15.0)
Immature Granulocytes: 0 %
Lymphocytes Relative: 7 %
Lymphs Abs: 0.7 10*3/uL (ref 0.7–4.0)
MCH: 30.7 pg (ref 26.0–34.0)
MCHC: 32.2 g/dL (ref 30.0–36.0)
MCV: 95.3 fL (ref 80.0–100.0)
Monocytes Absolute: 0.5 10*3/uL (ref 0.1–1.0)
Monocytes Relative: 5 %
Neutro Abs: 8.5 10*3/uL — ABNORMAL HIGH (ref 1.7–7.7)
Neutrophils Relative %: 88 %
Platelets: 195 10*3/uL (ref 150–400)
RBC: 4.07 MIL/uL (ref 3.87–5.11)
RDW: 12.5 % (ref 11.5–15.5)
WBC: 9.8 10*3/uL (ref 4.0–10.5)
nRBC: 0 % (ref 0.0–0.2)

## 2021-05-07 LAB — CBG MONITORING, ED: Glucose-Capillary: 121 mg/dL — ABNORMAL HIGH (ref 70–99)

## 2021-05-07 LAB — HEMOGLOBIN A1C
Hgb A1c MFr Bld: 7.1 % — ABNORMAL HIGH (ref 4.8–5.6)
Mean Plasma Glucose: 157.07 mg/dL

## 2021-05-07 LAB — TROPONIN I (HIGH SENSITIVITY): Troponin I (High Sensitivity): 8 ng/L (ref <18)

## 2021-05-07 LAB — COMPREHENSIVE METABOLIC PANEL
ALT: 98 U/L — ABNORMAL HIGH (ref 0–44)
AST: 161 U/L — ABNORMAL HIGH (ref 15–41)
Albumin: 3.4 g/dL — ABNORMAL LOW (ref 3.5–5.0)
Alkaline Phosphatase: 94 U/L (ref 38–126)
Anion gap: 11 (ref 5–15)
BUN: 15 mg/dL (ref 8–23)
CO2: 25 mmol/L (ref 22–32)
Calcium: 9.8 mg/dL (ref 8.9–10.3)
Chloride: 104 mmol/L (ref 98–111)
Creatinine, Ser: 1.14 mg/dL — ABNORMAL HIGH (ref 0.44–1.00)
GFR, Estimated: 47 mL/min — ABNORMAL LOW (ref >60)
Glucose, Bld: 201 mg/dL — ABNORMAL HIGH (ref 70–99)
Potassium: 4.9 mmol/L (ref 3.5–5.1)
Sodium: 140 mmol/L (ref 135–145)
Total Bilirubin: 1.2 mg/dL (ref 0.3–1.2)
Total Protein: 7 g/dL (ref 6.5–8.1)

## 2021-05-07 LAB — MAGNESIUM: Magnesium: 2.1 mg/dL (ref 1.7–2.4)

## 2021-05-07 LAB — PROTIME-INR
INR: 1.1 (ref 0.8–1.2)
Prothrombin Time: 14.4 seconds (ref 11.4–15.2)

## 2021-05-07 LAB — GLUCOSE, CAPILLARY
Glucose-Capillary: 71 mg/dL (ref 70–99)
Glucose-Capillary: 50 mg/dL — ABNORMAL LOW (ref 70–99)
Glucose-Capillary: 224 mg/dL — ABNORMAL HIGH (ref 70–99)
Glucose-Capillary: 128 mg/dL — ABNORMAL HIGH (ref 70–99)

## 2021-05-07 LAB — ABO/RH: ABO/RH(D): A NEG

## 2021-05-07 LAB — APTT: aPTT: 26 seconds (ref 24–36)

## 2021-05-07 MED ORDER — METRONIDAZOLE 500 MG/100ML IV SOLN
500.0000 mg | Freq: Three times a day (TID) | INTRAVENOUS | Status: DC
  Administered 2021-05-07 – 2021-05-10 (×11): 500 mg via INTRAVENOUS
  Filled 2021-05-07 (×11): qty 100

## 2021-05-07 MED ORDER — AMLODIPINE BESYLATE 10 MG PO TABS
10.0000 mg | ORAL_TABLET | Freq: Every day | ORAL | Status: DC
  Administered 2021-05-08 – 2021-05-12 (×4): 10 mg via ORAL
  Filled 2021-05-07 (×5): qty 1

## 2021-05-07 MED ORDER — PANTOPRAZOLE SODIUM 40 MG PO TBEC: 40.0000 mg | DELAYED_RELEASE_TABLET | Freq: Every day | ORAL | Status: DC

## 2021-05-07 MED ORDER — PANTOPRAZOLE SODIUM 40 MG IV SOLR
40.0000 mg | Freq: Every day | INTRAVENOUS | Status: DC
  Administered 2021-05-07 – 2021-05-08 (×2): 40 mg via INTRAVENOUS
  Filled 2021-05-07 (×2): qty 40

## 2021-05-07 MED ORDER — ACETAMINOPHEN 325 MG PO TABS
650.0000 mg | ORAL_TABLET | Freq: Four times a day (QID) | ORAL | Status: DC | PRN
  Administered 2021-05-10 – 2021-05-12 (×4): 650 mg via ORAL
  Filled 2021-05-07 (×5): qty 2

## 2021-05-07 MED ORDER — METOPROLOL SUCCINATE ER 100 MG PO TB24
100.0000 mg | ORAL_TABLET | Freq: Every day | ORAL | Status: DC
  Administered 2021-05-08 – 2021-05-12 (×5): 100 mg via ORAL
  Filled 2021-05-07 (×5): qty 1

## 2021-05-07 MED ORDER — POLYETHYLENE GLYCOL 3350 17 G PO PACK: 17.0000 g | PACK | Freq: Every day | ORAL | Status: DC | PRN

## 2021-05-07 MED ORDER — DEXTROSE 50 % IV SOLN
50.0000 mL | Freq: Once | INTRAVENOUS | Status: AC
  Administered 2021-05-07: 50 mL via INTRAVENOUS

## 2021-05-07 MED ORDER — HYDRALAZINE HCL 20 MG/ML IJ SOLN: 10.0000 mg | Freq: Four times a day (QID) | INTRAMUSCULAR | Status: DC | PRN

## 2021-05-07 MED ORDER — LACTATED RINGERS IV SOLN: INTRAVENOUS | Status: DC

## 2021-05-07 MED ORDER — FENTANYL CITRATE (PF) 100 MCG/2ML IJ SOLN: 12.5000 ug | INTRAMUSCULAR | Status: DC | PRN

## 2021-05-07 MED ORDER — SODIUM CHLORIDE 0.9 % IV SOLN
1.0000 g | Freq: Once | INTRAVENOUS | Status: AC
  Administered 2021-05-07: 1 g via INTRAVENOUS
  Filled 2021-05-07: qty 10

## 2021-05-07 MED ORDER — CITALOPRAM HYDROBROMIDE 20 MG PO TABS
20.0000 mg | ORAL_TABLET | Freq: Every day | ORAL | Status: DC
  Administered 2021-05-07 – 2021-05-12 (×6): 20 mg via ORAL
  Filled 2021-05-07 (×5): qty 1
  Filled 2021-05-07: qty 2

## 2021-05-07 MED ORDER — LORAZEPAM 0.5 MG PO TABS: 0.5000 mg | ORAL_TABLET | Freq: Two times a day (BID) | ORAL | Status: DC | PRN

## 2021-05-07 MED ORDER — INSULIN GLARGINE 100 UNIT/ML ~~LOC~~ SOLN
20.0000 [IU] | Freq: Every day | SUBCUTANEOUS | Status: DC
  Administered 2021-05-07: 20 [IU] via SUBCUTANEOUS
  Filled 2021-05-07: qty 0.2

## 2021-05-07 MED ORDER — SODIUM CHLORIDE 0.9 % IV SOLN
2.0000 g | INTRAVENOUS | Status: DC
  Administered 2021-05-07 – 2021-05-09 (×3): 2 g via INTRAVENOUS
  Filled 2021-05-07 (×3): qty 20
  Filled 2021-05-07: qty 2

## 2021-05-07 MED ORDER — DEXTROSE 50 % IV SOLN
INTRAVENOUS | Status: AC
  Administered 2021-05-07: 50 mL via INTRAVENOUS
  Filled 2021-05-07: qty 50

## 2021-05-07 MED ORDER — DEXTROSE IN LACTATED RINGERS 5 % IV SOLN: INTRAVENOUS | Status: AC

## 2021-05-07 MED ORDER — ONDANSETRON HCL 4 MG/2ML IJ SOLN
4.0000 mg | Freq: Four times a day (QID) | INTRAMUSCULAR | Status: DC | PRN
  Administered 2021-05-07 – 2021-05-11 (×3): 4 mg via INTRAVENOUS
  Filled 2021-05-07 (×3): qty 2

## 2021-05-07 MED ORDER — INSULIN ASPART 100 UNIT/ML IJ SOLN
0.0000 [IU] | Freq: Three times a day (TID) | INTRAMUSCULAR | Status: DC
  Administered 2021-05-08 – 2021-05-09 (×3): 1 [IU] via SUBCUTANEOUS
  Administered 2021-05-09 – 2021-05-11 (×5): 2 [IU] via SUBCUTANEOUS
  Administered 2021-05-11 (×3): 3 [IU] via SUBCUTANEOUS
  Administered 2021-05-12: 2 [IU] via SUBCUTANEOUS

## 2021-05-07 MED ORDER — GADOBUTROL 1 MMOL/ML IV SOLN
5.5000 mL | Freq: Once | INTRAVENOUS | Status: AC | PRN
  Administered 2021-05-07: 5.5 mL via INTRAVENOUS

## 2021-05-07 MED ORDER — FENTANYL CITRATE (PF) 100 MCG/2ML IJ SOLN: 25.0000 ug | INTRAMUSCULAR | Status: DC | PRN

## 2021-05-07 MED ORDER — ACETAMINOPHEN 650 MG RE SUPP: 650.0000 mg | Freq: Four times a day (QID) | RECTAL | Status: DC | PRN

## 2021-05-07 MED ORDER — ATORVASTATIN CALCIUM 10 MG PO TABS
10.0000 mg | ORAL_TABLET | Freq: Every day | ORAL | Status: DC
  Administered 2021-05-07 – 2021-05-08 (×2): 10 mg via ORAL
  Filled 2021-05-07 (×2): qty 1

## 2021-05-07 MED ORDER — IRBESARTAN 300 MG PO TABS
300.0000 mg | ORAL_TABLET | Freq: Every day | ORAL | Status: DC
  Administered 2021-05-07 – 2021-05-12 (×6): 300 mg via ORAL
  Filled 2021-05-07 (×6): qty 1

## 2021-05-07 NOTE — Plan of Care (Signed)
  Problem: Education: Goal: Knowledge of General Education information will improve Description Including pain rating scale, medication(s)/side effects and non-pharmacologic comfort measures Outcome: Progressing   

## 2021-05-07 NOTE — Progress Notes (Signed)
Patient returned from MRI, TELE and SCDs applied.

## 2021-05-07 NOTE — H&P (Signed)
History and Physical    Kara Dean GQQ:[id number] DOB: 23-Sep-1933 DOA: 05/06/2021  PCP: [name], FNP  Patient coming from: Home   Chief Complaint: Abdominal pain, nausea and vomiting    HPI:    87 year old female with past medical history of hyperlipidemia, insulin-dependent diabetes mellitus type 2, hypertension, gastroesophageal reflux disease, previous TIA, bilateral carotid artery stenosis as well as reported remote history of Crohn's diagnosed over 30 years ago not on treatment who presents to [facility] emergency department with complaints of abdominal pain nausea and vomiting.  Patient explains that earlier in the day she suddenly began to experience epigastric pain.  This epigastric pain was severe in intensity, dull in quality and nonradiating.  Shortly after the onset of her intense abdominal pain patient also began to experience severe nausea and began to experience frequent bouts of nonbilious nonbloody vomiting.  It is worth noting that patient reports having similar but less severe symptoms such as this occurring intermittently for approximately the past year.  Patient has never sought medical attention concerning this particular complaint.  Previous episodes of pain have been so severe in the past that unknown what was contacted but the patient would typically change her mind and refused to be brought to the hospital for evaluation.  Patient explains that this current episode today is the worst she has ever had.  Patient denies any associated fevers, recent ingestion of undercooked food, sick contacts, recent travel, recent antibiotic use or recent contact with confirmed COVID-19 infection.  Patient's symptoms continue to persist for several hours throughout the day until the patient contacted EMS and was promptly evaluated and brought into [facility] emergency department for evaluation.  Upon evaluation in the emergency department patient was  found to have a mild leukocytosis of 12.5.  CT imaging of the abdomen pelvis revealed evidence of a common bile duct stone.  Right upper quadrant ultrasound was additionally performed revealing evidence of a distended gallbladder with pericholecystic fluid and positive sonographic Murphy sign.  Additionally right upper quadrant ultrasound redemonstrated common bile duct stone.  Hospitalist group was then called to assess the patient for mission to the hospital.  Review of Systems:   Review of Systems  Constitutional:  Positive for malaise/fatigue.  Gastrointestinal:  Positive for abdominal pain, nausea and vomiting.  All other systems reviewed and are negative.  Past Medical History:  Diagnosis Date   Anxiety    Carotid artery disease ([place])    Carotid US 11/2018:  bilateral ICA 1-39 >> repeat 2 years.    Cataract    Crohn's colitis ([place])    CVA (cerebral infarction)    No deficits   Depression    Diabetes mellitus    type 2   Dyslipidemia    GERD (gastroesophageal reflux disease)    History of TIAs    no deficits   Hyperlipidemia    Hypertension    Hyponatremia    Nodular basal cell carcinoma (BCC) 04/22/2018   left prox, nasal root (tx p bx)   Osteoarthritis    SCC (squamous cell carcinoma) 04/22/2018   left shin,superior-(tx p bx), Left shin,inferior-(tx p bx )    Past Surgical History:  Procedure Laterality Date   ABDOMINAL HYSTERECTOMY     BACK SURGERY  1994   CATARACT EXTRACTION W/PHACO Left 09/19/2017   Procedure: CATARACT EXTRACTION PHACO AND INTRAOCULAR LENS PLACEMENT ([place] [facility]);  Surgeon: Baruch Goldmann, MD;  Location: [facility];  Service: Ophthalmology;  Laterality: Left;  CDE: 8.10  CATARACT EXTRACTION W/PHACO Right 10/10/2017   Procedure: CATARACT EXTRACTION PHACO AND INTRAOCULAR LENS PLACEMENT RIGHT EYE;  Surgeon: Baruch Goldmann, MD;  Location: [facility];  Service: Ophthalmology;  Laterality: Right;  CDE: 9.60   COLON RESECTION  1990's   TUBAL LIGATION     VESICOVAGINAL  FISTULA CLOSURE W/ TAH  1984     reports that she has never smoked. She has never used smokeless tobacco. She reports that she does not drink alcohol and does not use drugs.  Allergies  Allergen Reactions   Codeine Other (See Comments)    unknown   Latex Rash   Morphine Other (See Comments)    unknown   Niacin Other (See Comments)    unknown   Sulfa Antibiotics Rash   Sulfonamide Derivatives Rash    Family History  Problem Relation Age of Onset   Coronary artery disease Sister        stent x 3   Seizures Sister    Coronary artery disease Brother        stent   Hypertension Mother    Stroke Mother    Emphysema Father    Alcohol abuse Brother    Lung disease Brother    Heart attack Daughter      Prior to Admission medications   Medication Sig Start Date End Date Taking? Authorizing Provider  amLODipine (NORVASC) 10 MG tablet Take 1 tablet (10 mg total) by mouth daily. 05/01/21   Hassell Done, Mary-Margaret, FNP  atorvastatin (LIPITOR) 10 MG tablet Take 1 tablet (10 mg total) by mouth daily. (NEEDS TO BE SEEN BEFORE NEXT REFILL) 05/01/21   [name], FNP  B Complex Vitamins (B COMPLEX-B12) TABS Take 1 tablet by mouth daily.    [provider]  Cholecalciferol (VITAMIN D3 PO) Take 1 capsule daily by mouth.    [provider]  citalopram (CELEXA) 20 MG tablet Take 1 tablet (20 mg total) by mouth daily. (NEEDS TO BE SEEN BEFORE NEXT REFILL) 05/01/21   Hassell Done, Mary-Margaret, FNP  dipyridamole-aspirin (AGGRENOX) 200-25 MG 12hr capsule TAKE  (1)  CAPSULE  TWICE DAILY. 05/01/21   Hassell Done, Mary-Margaret, FNP  fluticasone (FLONASE) 50 MCG/ACT nasal spray USE 2 SPRAYS IN EACH NOSTRIL ONCE DAILY. 10/13/20   Hassell Done, Mary-Margaret, FNP  furosemide (LASIX) 20 MG tablet Take 1 tablet (20 mg total) by mouth as directed. Mon, Wed, and Fri only. 05/01/21   Hassell Done, Mary-Margaret, FNP  glucose blood (ONETOUCH ULTRA) test strip Check BS up to 3 times daily Dx E11.9 10/13/20    Hassell Done, Mary-Margaret, FNP  LANTUS SOLOSTAR 100 UNIT/ML Solostar Pen Inject 44 Units into the skin daily. 02/26/21   [provider]  LORazepam (ATIVAN) 0.5 MG tablet Take 1 tablet (0.5 mg total) by mouth 2 (two) times daily. 05/01/21   Hassell Done Mary-Margaret, FNP  Magnesium 250 MG TABS Take 250 mg by mouth daily.    [provider]  metoprolol succinate (TOPROL-XL) 100 MG 24 hr tablet TAKE 1 TABLET ONCE DAILY WITH A MEAL 05/01/21   Hassell Done, Mary-Margaret, FNP  olmesartan (BENICAR) 40 MG tablet Take 1 tablet (40 mg total) by mouth daily. 05/01/21   Hassell Done Mary-Margaret, FNP  Omega-3 Fatty Acids (FISH OIL) 1000 MG CAPS Take 1 capsule by mouth daily.    [provider]  pantoprazole (PROTONIX) 40 MG tablet Take 1 tablet (40 mg total) by mouth daily. (NEEDS TO BE SEEN BEFORE NEXT REFILL) 05/01/21   [name], FNP  potassium chloride (KLOR-CON) 10 MEQ tablet Take  1 tablet (10 mEq total) by mouth as directed. Mon, Wed, and Fri only. 05/01/21   [name], FNP    Physical Exam: Vitals:   05/06/21 2145 05/06/21 2200 05/06/21 2345 05/07/21 0100  BP: (!) 163/50 (!) 170/49 (!) 162/50 (!) 154/47  Pulse: (!) 58 (!) 57 63 (!) 58  Resp: 20 17 14  (!) 31  Temp:      TempSrc:      SpO2: 95% 95% 97% 96%  Weight:      Height:        Constitutional: Awake alert and oriented x3, patient is in mild distress due to abdominal pain.  Skin: no rashes, no lesions, poor skin turgor noted. Eyes: Pupils are equally reactive to light.  No evidence of scleral icterus or conjunctival pallor.  ENMT: Dry mucous membranes noted.  Posterior pharynx clear of any exudate or lesions.   Neck: normal, supple, no masses, no thyromegaly.  No evidence of jugular venous distension.   Respiratory: clear to auscultation bilaterally, no wheezing, no crackles. Normal respiratory effort. No accessory muscle use.  Cardiovascular: Regular rate and rhythm, no murmurs / rubs / gallops. No extremity  edema. 2+ pedal pulses. No carotid bruits.  Chest:   Nontender without crepitus or deformity.   Back:   Nontender without crepitus or deformity. Abdomen: Generalized abdominal tenderness that is worst in the epigastric region.  Abdomen is soft however.    No evidence of intra-abdominal masses.  Positive bowel sounds noted in all quadrants.   Musculoskeletal: No joint deformity upper and lower extremities. Good ROM, no contractures. Normal muscle tone.  Neurologic: CN 2-12 grossly intact. Sensation intact.  Patient moving all 4 extremities spontaneously.  Patient is following all commands.  Patient is responsive to verbal stimuli.   Psychiatric: Patient exhibits normal mood with appropriate affect.  Patient seems to possess insight as to their current situation.     Labs on Admission: I have personally reviewed following labs and imaging studies -   CBC: Recent Labs  Lab 05/01/21 1116 05/06/21 2033  WBC 9.0 12.5*  NEUTROABS 6.5 11.1*  HGB 12.4 11.4*  HCT 37.6 35.4*  MCV 93 95.7  PLT 180 226   Basic Metabolic Panel: Recent Labs  Lab 05/01/21 1116 05/06/21 2033  NA 141 139  K 5.1 4.2  CL 100 106  CO2 21 25  GLUCOSE 167* 157*  BUN 20 18  CREATININE 1.39* 1.20*  CALCIUM 10.3 9.6   GFR: Estimated Creatinine Clearance: 26.9 mL/min (A) (by C-G formula based on SCr of 1.2 mg/dL (H)). Liver Function Tests: Recent Labs  Lab 05/01/21 1116 05/06/21 2033  AST 15 114*  ALT 9 52*  ALKPHOS 76 76  BILITOT 0.5 1.6*  PROT 7.0 6.8  ALBUMIN 4.5 3.6   Recent Labs  Lab 05/06/21 2033  LIPASE 31   No results for input(s): AMMONIA in the last 168 hours. Coagulation Profile: Recent Labs  Lab 05/06/21 2033  INR 1.1   Cardiac Enzymes: No results for input(s): CKTOTAL, CKMB, CKMBINDEX, TROPONINI in the last 168 hours. BNP (last 3 results) No results for input(s): PROBNP in the last 8760 hours. HbA1C: No results for input(s): HGBA1C in the last 72 hours. CBG: No results for  input(s): GLUCAP in the last 168 hours. Lipid Profile: No results for input(s): CHOL, HDL, LDLCALC, TRIG, CHOLHDL, LDLDIRECT in the last 72 hours. Thyroid Function Tests: No results for input(s): TSH, T4TOTAL, FREET4, T3FREE, THYROIDAB in the last 72 hours. Anemia Panel: No  results for input(s): VITAMINB12, FOLATE, FERRITIN, TIBC, IRON, RETICCTPCT in the last 72 hours. Urine analysis:    Component Value Date/Time   COLORURINE STRAW (A) 01/09/2018 1922   APPEARANCEUR HAZY (A) 01/09/2018 1922   LABSPEC 1.004 (L) 01/09/2018 1922   PHURINE 6.0 01/09/2018 1922   GLUCOSEU 50 (A) 01/09/2018 1922   HGBUR NEGATIVE 01/09/2018 [place] NEGATIVE 01/09/2018 [place] NEGATIVE 01/09/2018 1922   PROTEINUR NEGATIVE 01/09/2018 1922   UROBILINOGEN 0.2 11/09/2010 2231   NITRITE NEGATIVE 01/09/2018 1922   LEUKOCYTESUR LARGE (A) 01/09/2018 1922    Radiological Exams on Admission - Personally Reviewed: CT ABDOMEN PELVIS W CONTRAST  Result Date: 05/06/2021 CLINICAL DATA:  Epigastric pain, nausea, and vomiting. History of Crohn's colitis. EXAM: CT ABDOMEN AND PELVIS WITH CONTRAST TECHNIQUE: Multidetector CT imaging of the abdomen and pelvis was performed using the standard protocol following bolus administration of intravenous contrast. CONTRAST:  33m OMNIPAQUE IOHEXOL 300 MG/ML  SOLN COMPARISON:  None. FINDINGS: Lower chest: Coarse interstitial scarring in the lung bases. Coronary artery calcifications. Small esophageal hiatal hernia. Hepatobiliary: Cholelithiasis with distended gallbladder. Moderate intrahepatic and extrahepatic bile duct dilatation. A tiny calcification is suggested in the distal common bile duct suggesting a common duct stone. Pancreas: Unremarkable. No pancreatic ductal dilatation or surrounding inflammatory changes. Spleen: Normal in size without focal abnormality. Adrenals/Urinary Tract: No adrenal gland nodules. Small parenchymal cysts in both kidneys. Scarring in the  upper pole of the right kidney. Nephrograms are otherwise symmetrical and homogeneous. No hydronephrosis or hydroureter. Bladder is unremarkable. Stomach/Bowel: Stomach, small bowel, and colon are not abnormally distended. Mild wall thickening in the rectosigmoid and descending colon likely related to the patient's history of colitis. No pericolonic inflammatory changes or abscess is identified. Appendix is not visualized. Vascular/Lymphatic: Diffuse calcification of the abdominal aorta and celiac/mesenteric branch vessels. No aneurysm. Reproductive: Prostate is unremarkable. Other: No free air or free fluid in the abdomen. Abdominal wall musculature appears intact. Musculoskeletal: Degenerative changes in the lumbar spine. No destructive bone lesions. IMPRESSION: 1. Cholelithiasis and distended gallbladder. Bile duct dilatation with probable common bile duct stone. 2. Coarse interstitial fibrosis in the lung bases. 3. Small esophageal hiatal hernia. 4. Aortic atherosclerosis. 5. Mild diffuse wall thickening of the left colon consistent with patient's history of colitis. Electronically Signed   By: WLucienne CapersM.D.   On: 05/06/2021 22:24   UK[place]Abdomen Limited RUQ (LIVER/GB)  Result Date: 05/06/2021 CLINICAL DATA:  Right upper quadrant pain. EXAM: ULTRASOUND ABDOMEN LIMITED RIGHT UPPER QUADRANT COMPARISON:  Abdominal CT earlier today. FINDINGS: Gallbladder: Distended containing multiple gallstones. There is trace pericholecystic fluid. Positive sonographic Murphy sign noted by the sonographer. No definite gallbladder wall thickening. Common bile duct: Diameter: Dilated measuring 8 mm. Probable echogenic stone in the distal common bile duct measuring 9 mm. Liver: There is intrahepatic biliary ductal dilatation. No focal lesion identified. Within normal limits in parenchymal echogenicity. Portal vein is patent on color Doppler imaging with normal direction of blood flow towards the liver. Other: None.  IMPRESSION: 1. Gallstones with distended gallbladder, trace pericholecystic fluid, and positive sonographic Murphy sign, suspicious for acute cholecystitis. 2. Intra and extrahepatic biliary ductal dilatation. Probable 9 mm stone in the distal common bile duct. Electronically Signed   By: MKeith RakeM.D.   On: 05/06/2021 23:33    EKG: Personally reviewed.  Sinus bradycardia with heart rate of 56 bpm.  Incomplete left bundle branch block.  No dynamic ST segment changes appreciated.  Assessment/Plan  Principal Problem:   Choledocholithiasis with acute cholecystitis  Patient presenting with relatively sudden onset of severe abdominal pain nausea and vomiting that seems to have been intermittently happening over the past year Radiographic work-up on presentation revealing evidence of acute cholecystitis and choledocholithiasis No clinical evidence of sepsis Hydrating patient gently with intravenous isotonic fluids Treating patient with intravenous antibiotics with ceftriaxone and metronidazole As needed opiate-based analgesics for associated pain As needed antiemetics N.p.o. for now -sending epic secure chat to on-call [place] gastro[facility] for consideration of ERCP in the morning We will additionally formally consult general surgery during the day for evaluation of acute cholecystitis and evaluation of treatment options  Active Problems:   Essential hypertension  Continue home regimen of antihypertensive therapy Blood pressures are likely exacerbated by ongoing substantial epigastric pain    Type 2 diabetes mellitus with stage 3a chronic kidney disease, with long-term current use of insulin ([place])  Patient been placed on Accu-Cheks before every meal and nightly with sliding scale insulin Patient has been placed on a reduced regimen of basal insulin therapy due to tenuous oral intake Hemoglobin A1C ordered    Gastroesophageal reflux disease without esophagitis  Protonix 40 mg IV  daily for now    GAD (generalized anxiety disorder)  Continue home regimen of psychotropic therapy    Chronic kidney disease (CKD) stage G3a/A3, moderately decreased glomerular filtration rate (GFR) between 45-59 mL/min/1.73 square meter and albuminuria creatinine ratio greater than 300 mg/g (HCC)  Strict intake and output monitoring Creatinine near baseline Minimizing nephrotoxic agents as much as possible Serial chemistries to monitor renal function and electrolytes    Mixed diabetic hyperlipidemia associated with type 2 diabetes mellitus ([place])  Continuing home regimen of lipid lowering therapy.    Code Status:  Full code Family Communication: Daughter is at bedside who has been updated on plan of care  Status is: Inpatient  Remains inpatient appropriate because:Ongoing diagnostic testing needed not appropriate for outpatient work up, IV treatments appropriate due to intensity of illness or inability to take PO, and Inpatient level of care appropriate due to severity of illness  Dispo: The patient is from: Home              Anticipated d/c is to: Home              Patient currently is not medically stable to d/c.   Difficult to place patient No        Vernelle Emerald MD [facility] Pager [phone]  If 7PM-7AM, please contact night-coverage [email or web address] Use universal [place] password for that web site. If you do not have the password, please call the hospital operator.  05/07/2021, 1:58 AM

## 2021-05-07 NOTE — Progress Notes (Signed)
Hypoglycemic event with CBG 50 reported by bedside RN Rachelle.  Treated with IV D50.  Will add D5 to LR and hold off Lantus 20 U daily for now.  Repeat CBG q1H until CBG>70.

## 2021-05-07 NOTE — Progress Notes (Signed)
Patient arrived to unit from ED then left for MRI at this time.

## 2021-05-07 NOTE — Consult Note (Signed)
CC: RUQ/MEG pain  Requesting provider: Dr. Cyd Silence  HPI: Kara Dean is an 87 y.o. female with hx of HTN, HLD, T2DM, PAD, prior TIA, Crohns (s/p exlap and 'bowel resection' 1990s, no treatment since) - presented to ED with acute onset epigastric abdominal pain 1 day ago.  She has had multiple prior attacks similar to this but less severe in intensity; she has previously refused to come to ED despite her daughter telling her she should.  The pain is described as severe and dull and without radiation.  She had associated nausea and vomiting.  She denies fever or chills.  The pain has no aggravating or alleviating factors.  She underwent work-up in the ER and we are asked to see.  Her daughter reports she is her caregiver and is at bedside currently.  Of note, she does have a large midline incision consistent with her stated history.  Past Medical History:  Diagnosis Date   Anxiety    Carotid artery disease ([place])    Carotid US 11/2018:  bilateral ICA 1-39 >> repeat 2 years.    Cataract    Crohn's colitis ([place])    CVA (cerebral infarction)    No deficits   Depression    Diabetes mellitus    type 2   Dyslipidemia    GERD (gastroesophageal reflux disease)    History of TIAs    no deficits   Hyperlipidemia    Hypertension    Hyponatremia    Nodular basal cell carcinoma (BCC) 04/22/2018   left prox, nasal root (tx p bx)   Osteoarthritis    SCC (squamous cell carcinoma) 04/22/2018   left shin,superior-(tx p bx), Left shin,inferior-(tx p bx )    Past Surgical History:  Procedure Laterality Date   ABDOMINAL HYSTERECTOMY     BACK SURGERY  1994   CATARACT EXTRACTION W/PHACO Left 09/19/2017   Procedure: CATARACT EXTRACTION PHACO AND INTRAOCULAR LENS PLACEMENT ([place]);  Surgeon: Baruch Goldmann, MD;  Location: [facility];  Service: Ophthalmology;  Laterality: Left;  CDE: 8.10   CATARACT EXTRACTION W/PHACO Right 10/10/2017   Procedure: CATARACT EXTRACTION PHACO AND INTRAOCULAR LENS  PLACEMENT RIGHT EYE;  Surgeon: Baruch Goldmann, MD;  Location: [facility];  Service: Ophthalmology;  Laterality: Right;  CDE: 9.60   COLON RESECTION  1990's   TUBAL LIGATION     VESICOVAGINAL FISTULA CLOSURE W/ TAH  1984    Family History  Problem Relation Age of Onset   Coronary artery disease Sister        stent x 3   Seizures Sister    Coronary artery disease Brother        stent   Hypertension Mother    Stroke Mother    Emphysema Father    Alcohol abuse Brother    Lung disease Brother    Heart attack Daughter     Social:  reports that she has never smoked. She has never used smokeless tobacco. She reports that she does not drink alcohol and does not use drugs.  Allergies:  Allergies  Allergen Reactions   Codeine Other (See Comments)    unknown   Latex Rash   Morphine Other (See Comments)    unknown   Niacin Other (See Comments)    unknown   Sulfa Antibiotics Rash   Sulfonamide Derivatives Rash    Medications: I have reviewed the patient's current medications.  Results for orders placed or performed during the hospital encounter of 05/06/21 (from the past 48 hour(s))  Lipase,  blood     Status: None   Collection Time: 05/06/21  8:33 PM  Result Value Ref Range   Lipase 31 11 - 51 U/L    Comment: Performed at [facility], [place] [address] [place], [place] [address]  Comprehensive metabolic panel     Status: Abnormal   Collection Time: 05/06/21  8:33 PM  Result Value Ref Range   Sodium 139 135 - 145 mmol/L   Potassium 4.2 3.5 - 5.1 mmol/L   Chloride 106 98 - 111 mmol/L   CO2 25 22 - 32 mmol/L   Glucose, Bld 157 (H) 70 - 99 mg/dL    Comment: Glucose reference range applies only to samples taken after fasting for at least 8 hours.   BUN 18 8 - 23 mg/dL   Creatinine, Ser 1.20 (H) 0.44 - 1.00 mg/dL   Calcium 9.6 8.9 - 10.3 mg/dL   Total Protein 6.8 6.5 - 8.1 g/dL   Albumin 3.6 3.5 - 5.0 g/dL   AST 114 (H) 15 - 41 U/L   ALT 52 (H) 0 - 44 U/L   Alkaline  Phosphatase 76 38 - 126 U/L   Total Bilirubin 1.6 (H) 0.3 - 1.2 mg/dL   GFR, Estimated 44 (L) >60 mL/min    Comment: (NOTE) Calculated using the CKD-EPI Creatinine Equation (2021)    Anion gap 8 5 - 15    Comment: Performed at [facility], [place] [address], [place], [place] [address]  CBC with Differential     Status: Abnormal   Collection Time: 05/06/21  8:33 PM  Result Value Ref Range   WBC 12.5 (H) 4.0 - 10.5 K/uL   RBC 3.70 (L) 3.87 - 5.11 MIL/uL   Hemoglobin 11.4 (L) 12.0 - 15.0 g/dL   HCT 35.4 (L) 36.0 - 46.0 %   MCV 95.7 80.0 - 100.0 fL   MCH 30.8 26.0 - 34.0 pg   MCHC 32.2 30.0 - 36.0 g/dL   RDW 12.6 11.5 - 15.5 %   Platelets 192 150 - 400 K/uL   nRBC 0.0 0.0 - 0.2 %   Neutrophils Relative % 89 %   Neutro Abs 11.1 (H) 1.7 - 7.7 K/uL   Lymphocytes Relative 6 %   Lymphs Abs 0.8 0.7 - 4.0 K/uL   Monocytes Relative 4 %   Monocytes Absolute 0.4 0.1 - 1.0 K/uL   Eosinophils Relative 1 %   Eosinophils Absolute 0.1 0.0 - 0.5 K/uL   Basophils Relative 0 %   Basophils Absolute 0.0 0.0 - 0.1 K/uL   Immature Granulocytes 0 %   Abs Immature Granulocytes 0.04 0.00 - 0.07 K/uL    Comment: Performed at [facility], [place] [address] [place], [place] [address]  Lactic acid, plasma     Status: None   Collection Time: 05/06/21  8:33 PM  Result Value Ref Range   Lactic Acid, Venous 1.3 0.5 - 1.9 mmol/L    Comment: Performed at [facility] [address] [place], [place] [address]  Troponin I (High Sensitivity)     Status: None   Collection Time: 05/06/21  8:33 PM  Result Value Ref Range   Troponin I (High Sensitivity) 9 <18 ng/L    Comment: (NOTE) Elevated high sensitivity troponin I (hsTnI) values and significant  changes across serial measurements may suggest ACS but many other  chronic and acute conditions are known to elevate hsTnI results.  Refer to the "Links" section for chest  pain algorithms and additional  guidance. Performed at [facility], [place] [address], [place], [place] [address]   Protime-INR     Status: None   Collection Time: 05/06/21  8:33 PM  Result Value Ref Range   Prothrombin Time 14.5 11.4 - 15.2 seconds   INR 1.1 0.8 - 1.2    Comment: (NOTE) INR goal varies based on device and disease states. Performed at [facility], [place] [address] [place], [place] [address]   Type and screen [place]     Status: None   Collection Time: 05/06/21  8:36 PM  Result Value Ref Range   ABO/RH(D) A NEG    Antibody Screen NEG    Sample Expiration      05/09/2021,2359 Performed at [facility], [place] [address] [place],  [address]   Resp Panel by RT-PCR (Flu A&B, Covid) Nasopharyngeal Swab     Status: None   Collection Time: 05/06/21  9:24 PM   Specimen: Nasopharyngeal Swab; Nasopharyngeal(NP) swabs in vial transport medium  Result Value Ref Range   SARS Coronavirus 2 by RT PCR NEGATIVE NEGATIVE    Comment: (NOTE) SARS-CoV-2 target nucleic acids are NOT DETECTED.  The SARS-CoV-2 RNA is generally detectable in upper respiratory specimens during the acute phase of infection. The lowest concentration of SARS-CoV-2 viral copies this assay can detect is 138 copies/mL. A negative result does not preclude SARS-Cov-2 infection and should not be used as the sole basis for treatment or other patient management decisions. A negative result may occur with  improper specimen collection/handling, submission of specimen other than nasopharyngeal swab, presence of viral mutation(s) within the areas targeted by this assay, and inadequate number of viral copies(<138 copies/mL). A negative result must be combined with clinical observations, patient history, and epidemiological information. The expected result is Negative.  Fact Sheet for Patients:  EntrepreneurPulse.com.au  Fact Sheet for Healthcare Providers:  IncredibleEmployment.be  This test is no t yet  approved or cleared by the [place] FDA and  has been authorized for detection and/or diagnosis of SARS-CoV-2 by FDA under an Emergency Use Authorization (EUA). This EUA will remain  in effect (meaning this test can be used) for the duration of the COVID-19 declaration under Section 564(b)(1) of the Act, 21 U.S.C.section 360bbb-3(b)(1), unless the authorization is terminated  or revoked sooner.       Influenza A by PCR NEGATIVE NEGATIVE   Influenza B by PCR NEGATIVE NEGATIVE    Comment: (NOTE) The Xpert Xpress SARS-CoV-2/FLU/RSV plus assay is intended as an aid in the diagnosis of influenza from Nasopharyngeal swab specimens and should not be used as a sole basis for treatment. Nasal washings and aspirates are unacceptable for Xpert Xpress SARS-CoV-2/FLU/RSV testing.  Fact Sheet for Patients: EntrepreneurPulse.com.au  Fact Sheet for Healthcare Providers: IncredibleEmployment.be  This test is not yet approved or cleared by the [place] FDA and has been authorized for detection and/or diagnosis of SARS-CoV-2 by FDA under an Emergency Use Authorization (EUA). This EUA will remain in effect (meaning this test can be used) for the duration of the COVID-19 declaration under Section 564(b)(1) of the Act, 21 U.S.C. section 360bbb-3(b)(1), unless the authorization is terminated or revoked.  Performed at [facility], [place] [address] [place], [place] [address]   Troponin I (High Sensitivity)     Status: None   Collection Time: 05/06/21 10:33 PM  Result Value Ref Range   Troponin I (High Sensitivity)  8 <18 ng/L    Comment: (NOTE) Elevated high sensitivity troponin I (hsTnI) values and significant  changes across serial measurements may suggest ACS but many other  chronic and acute conditions are known to elevate hsTnI results.  Refer to the "Links" section for chest pain algorithms and additional  guidance. Performed at [facility], [place] [address] [place], [place] [address]   Hemoglobin A1c     Status: Abnormal   Collection Time: 05/07/21  3:45 AM  Result Value Ref Range   Hgb A1c MFr Bld 7.1 (H) 4.8 - 5.6 %    Comment: (NOTE) Pre diabetes:          5.7%-6.4%  Diabetes:              >6.4%  Glycemic control for   <7.0% adults with diabetes    Mean Plasma Glucose 157.07 mg/dL    Comment: Performed at [facility] [address], [place], [place] [address]  Comprehensive metabolic panel     Status: Abnormal   Collection Time: 05/07/21  3:45 AM  Result Value Ref Range   Sodium 140 135 - 145 mmol/L   Potassium 4.9 3.5 - 5.1 mmol/L   Chloride 104 98 - 111 mmol/L   CO2 25 22 - 32 mmol/L   Glucose, Bld 201 (H) 70 - 99 mg/dL    Comment: Glucose reference range applies only to samples taken after fasting for at least 8 hours.   BUN 15 8 - 23 mg/dL   Creatinine, Ser 1.14 (H) 0.44 - 1.00 mg/dL   Calcium 9.8 8.9 - 10.3 mg/dL   Total Protein 7.0 6.5 - 8.1 g/dL   Albumin 3.4 (L) 3.5 - 5.0 g/dL   AST 161 (H) 15 - 41 U/L   ALT 98 (H) 0 - 44 U/L   Alkaline Phosphatase 94 38 - 126 U/L   Total Bilirubin 1.2 0.3 - 1.2 mg/dL   GFR, Estimated 47 (L) >60 mL/min    Comment: (NOTE) Calculated using the CKD-EPI Creatinine Equation (2021)    Anion gap 11 5 - 15    Comment: Performed at [facility], [place] [address] [place], [place] [address]  Magnesium     Status: None   Collection Time: 05/07/21  3:45 AM  Result Value Ref Range   Magnesium 2.1 1.7 - 2.4 mg/dL    Comment: Performed at [facility] [address], [place], [place] [address]  CBC WITH DIFFERENTIAL     Status: Abnormal   Collection Time: 05/07/21  3:45 AM  Result Value Ref Range   WBC 9.8 4.0 - 10.5 K/uL   RBC 4.07 3.87 - 5.11 MIL/uL   Hemoglobin 12.5 12.0 - 15.0 g/dL   HCT 38.8 36.0 - 46.0 %   MCV 95.3 80.0 - 100.0 fL   MCH 30.7 26.0 - 34.0 pg   MCHC 32.2 30.0 - 36.0 g/dL   RDW 12.5 11.5 - 15.5 %   Platelets 195 150 - 400  K/uL   nRBC 0.0 0.0 - 0.2 %   Neutrophils Relative % 88 %   Neutro Abs 8.5 (H) 1.7 - 7.7 K/uL   Lymphocytes Relative 7 %   Lymphs Abs 0.7 0.7 - 4.0 K/uL   Monocytes Relative 5 %   Monocytes Absolute 0.5 0.1 - 1.0 K/uL   Eosinophils Relative 0 %   Eosinophils Absolute 0.0 0.0 - 0.5 K/uL   Basophils Relative 0 %  Basophils Absolute 0.0 0.0 - 0.1 K/uL   Immature Granulocytes 0 %   Abs Immature Granulocytes 0.02 0.00 - 0.07 K/uL    Comment: Performed at [facility], [place] [address], [place], [place] [address]  APTT     Status: None   Collection Time: 05/07/21  3:45 AM  Result Value Ref Range   aPTT 26 24 - 36 seconds    Comment: Performed at [facility] [address], [place], [place] [address]  Protime-INR     Status: None   Collection Time: 05/07/21  3:45 AM  Result Value Ref Range   Prothrombin Time 14.4 11.4 - 15.2 seconds   INR 1.1 0.8 - 1.2    Comment: (NOTE) INR goal varies based on device and disease states. Performed at [facility], [place] [address], [place], [place] [address]   CBG monitoring, ED     Status: Abnormal   Collection Time: 05/07/21  8:29 AM  Result Value Ref Range   Glucose-Capillary 121 (H) 70 - 99 mg/dL    Comment: Glucose reference range applies only to samples taken after fasting for at least 8 hours.    CT ABDOMEN PELVIS W CONTRAST  Result Date: 05/06/2021 CLINICAL DATA:  Epigastric pain, nausea, and vomiting. History of Crohn's colitis. EXAM: CT ABDOMEN AND PELVIS WITH CONTRAST TECHNIQUE: Multidetector CT imaging of the abdomen and pelvis was performed using the standard protocol following bolus administration of intravenous contrast. CONTRAST:  30m OMNIPAQUE IOHEXOL 300 MG/ML  SOLN COMPARISON:  None. FINDINGS: Lower chest: Coarse interstitial scarring in the lung bases. Coronary artery calcifications. Small esophageal hiatal hernia. Hepatobiliary: Cholelithiasis with distended gallbladder. Moderate intrahepatic and extrahepatic  bile duct dilatation. A tiny calcification is suggested in the distal common bile duct suggesting a common duct stone. Pancreas: Unremarkable. No pancreatic ductal dilatation or surrounding inflammatory changes. Spleen: Normal in size without focal abnormality. Adrenals/Urinary Tract: No adrenal gland nodules. Small parenchymal cysts in both kidneys. Scarring in the upper pole of the right kidney. Nephrograms are otherwise symmetrical and homogeneous. No hydronephrosis or hydroureter. Bladder is unremarkable. Stomach/Bowel: Stomach, small bowel, and colon are not abnormally distended. Mild wall thickening in the rectosigmoid and descending colon likely related to the patient's history of colitis. No pericolonic inflammatory changes or abscess is identified. Appendix is not visualized. Vascular/Lymphatic: Diffuse calcification of the abdominal aorta and celiac/mesenteric branch vessels. No aneurysm. Reproductive: Prostate is unremarkable. Other: No free air or free fluid in the abdomen. Abdominal wall musculature appears intact. Musculoskeletal: Degenerative changes in the lumbar spine. No destructive bone lesions. IMPRESSION: 1. Cholelithiasis and distended gallbladder. Bile duct dilatation with probable common bile duct stone. 2. Coarse interstitial fibrosis in the lung bases. 3. Small esophageal hiatal hernia. 4. Aortic atherosclerosis. 5. Mild diffuse wall thickening of the left colon consistent with patient's history of colitis. Electronically Signed   By: WLucienne CapersM.D.   On: 05/06/2021 22:24   UKoreaAbdomen Limited RUQ (LIVER/GB)  Result Date: 05/06/2021 CLINICAL DATA:  Right upper quadrant pain. EXAM: ULTRASOUND ABDOMEN LIMITED RIGHT UPPER QUADRANT COMPARISON:  Abdominal CT earlier today. FINDINGS: Gallbladder: Distended containing multiple gallstones. There is trace pericholecystic fluid. Positive sonographic Murphy sign noted by the sonographer. No definite gallbladder wall thickening. Common bile  duct: Diameter: Dilated measuring 8 mm. Probable echogenic stone in the distal common bile duct measuring 9 mm. Liver: There is intrahepatic biliary ductal dilatation. No focal lesion identified. Within normal limits in parenchymal echogenicity. Portal vein  is patent on color Doppler imaging with normal direction of blood flow towards the liver. Other: None. IMPRESSION: 1. Gallstones with distended gallbladder, trace pericholecystic fluid, and positive sonographic Murphy sign, suspicious for acute cholecystitis. 2. Intra and extrahepatic biliary ductal dilatation. Probable 9 mm stone in the distal common bile duct. Electronically Signed   By: Keith Rake M.D.   On: 05/06/2021 23:33    ROS - all of the below systems have been reviewed with the patient and positives are indicated with bold text General: chills, fever or night sweats Eyes: blurry vision or double vision ENT: epistaxis or sore throat Allergy/Immunology: itchy/watery eyes or nasal congestion Hematologic/Lymphatic: bleeding problems, blood clots or swollen lymph nodes Endocrine: temperature intolerance or unexpected weight changes Breast: new or changing breast lumps or nipple discharge Resp: cough, shortness of breath, or wheezing CV: chest pain or dyspnea on exertion GI: as per HPI GU: dysuria, trouble voiding, or hematuria MSK: joint pain or joint stiffness Neuro: TIA or stroke symptoms Derm: pruritus and skin lesion changes Psych: anxiety and depression  PE Blood pressure (!) 152/44, pulse (!) 54, temperature 97.9 F (36.6 C), temperature source Oral, resp. rate 19, height 5' (1.524 m), weight 56.2 kg, SpO2 97 %. Constitutional: NAD; conversant; no deformities Eyes: Moist conjunctiva; no lid lag; anicteric; PERRL Neck: Trachea midline; no thyromegaly Lungs: Normal respiratory effort; no tactile fremitus CV: RRR; no palpable thrills; no pitting edema GI: Abd soft, mildly ttp in RUQ, negative murphy's; no rebound nor  guarding; no palpable hepatosplenomegaly MSK: Normal range of motion of extremities; no clubbing/cyanosis Psychiatric: Appropriate affect; alert and oriented x3 Lymphatic: No palpable cervical or axillary lymphadenopathy  Results for orders placed or performed during the hospital encounter of 05/06/21 (from the past 48 hour(s))  Lipase, blood     Status: None   Collection Time: 05/06/21  8:33 PM  Result Value Ref Range   Lipase 31 11 - 51 U/L    Comment: Performed at [facility], [place] [address] [place], [place] [address]  Comprehensive metabolic panel     Status: Abnormal   Collection Time: 05/06/21  8:33 PM  Result Value Ref Range   Sodium 139 135 - 145 mmol/L   Potassium 4.2 3.5 - 5.1 mmol/L   Chloride 106 98 - 111 mmol/L   CO2 25 22 - 32 mmol/L   Glucose, Bld 157 (H) 70 - 99 mg/dL    Comment: Glucose reference range applies only to samples taken after fasting for at least 8 hours.   BUN 18 8 - 23 mg/dL   Creatinine, Ser 1.20 (H) 0.44 - 1.00 mg/dL   Calcium 9.6 8.9 - 10.3 mg/dL   Total Protein 6.8 6.5 - 8.1 g/dL   Albumin 3.6 3.5 - 5.0 g/dL   AST 114 (H) 15 - 41 U/L   ALT 52 (H) 0 - 44 U/L   Alkaline Phosphatase 76 38 - 126 U/L   Total Bilirubin 1.6 (H) 0.3 - 1.2 mg/dL   GFR, Estimated 44 (L) >60 mL/min    Comment: (NOTE) Calculated using the CKD-EPI Creatinine Equation (2021)    Anion gap 8 5 - 15    Comment: Performed at [facility], [place] [address], [place], [place] [address]  CBC with Differential     Status: Abnormal   Collection Time: 05/06/21  8:33 PM  Result Value Ref Range   WBC 12.5 (H) 4.0 - 10.5 K/uL   RBC 3.70 (L) 3.87 - 5.11  MIL/uL   Hemoglobin 11.4 (L) 12.0 - 15.0 g/dL   HCT 35.4 (L) 36.0 - 46.0 %   MCV 95.7 80.0 - 100.0 fL   MCH 30.8 26.0 - 34.0 pg   MCHC 32.2 30.0 - 36.0 g/dL   RDW 12.6 11.5 - 15.5 %   Platelets 192 150 - 400 K/uL   nRBC 0.0 0.0 - 0.2 %   Neutrophils Relative % 89 %   Neutro Abs 11.1 (H) 1.7 - 7.7 K/uL   Lymphocytes  Relative 6 %   Lymphs Abs 0.8 0.7 - 4.0 K/uL   Monocytes Relative 4 %   Monocytes Absolute 0.4 0.1 - 1.0 K/uL   Eosinophils Relative 1 %   Eosinophils Absolute 0.1 0.0 - 0.5 K/uL   Basophils Relative 0 %   Basophils Absolute 0.0 0.0 - 0.1 K/uL   Immature Granulocytes 0 %   Abs Immature Granulocytes 0.04 0.00 - 0.07 K/uL    Comment: Performed at [facility] [address] [place], [place] [address]  Lactic acid, plasma     Status: None   Collection Time: 05/06/21  8:33 PM  Result Value Ref Range   Lactic Acid, Venous 1.3 0.5 - 1.9 mmol/L    Comment: Performed at [facility] [address] [place], [place] [address]  Troponin I (High Sensitivity)     Status: None   Collection Time: 05/06/21  8:33 PM  Result Value Ref Range   Troponin I (High Sensitivity) 9 <18 ng/L    Comment: (NOTE) Elevated high sensitivity troponin I (hsTnI) values and significant  changes across serial measurements may suggest ACS but many other  chronic and acute conditions are known to elevate hsTnI results.  Refer to the "Links" section for chest pain algorithms and additional  guidance. Performed at [facility], [place] [address], [place], [place] [address]   Protime-INR     Status: None   Collection Time: 05/06/21  8:33 PM  Result Value Ref Range   Prothrombin Time 14.5 11.4 - 15.2 seconds   INR 1.1 0.8 - 1.2    Comment: (NOTE) INR goal varies based on device and disease states. Performed at [facility], [place] [address] [place], [place] [address]   Type and screen [place]     Status: None   Collection Time: 05/06/21  8:36 PM  Result Value Ref Range   ABO/RH(D) A NEG    Antibody Screen NEG    Sample Expiration      05/09/2021,2359 Performed at [facility], [place] [address], [place], [place] [address]   Resp Panel by RT-PCR (Flu A&B, Covid) Nasopharyngeal Swab     Status: None   Collection Time: 05/06/21  9:24 PM   Specimen: Nasopharyngeal  Swab; Nasopharyngeal(NP) swabs in vial transport medium  Result Value Ref Range   SARS Coronavirus 2 by RT PCR NEGATIVE NEGATIVE    Comment: (NOTE) SARS-CoV-2 target nucleic acids are NOT DETECTED.  The SARS-CoV-2 RNA is generally detectable in upper respiratory specimens during the acute phase of infection. The lowest concentration of SARS-CoV-2 viral copies this assay can detect is 138 copies/mL. A negative result does not preclude SARS-Cov-2 infection and should not be used as the sole basis for treatment or other patient management decisions. A negative result may occur with  improper specimen collection/handling, submission of specimen other than nasopharyngeal swab, presence of viral mutation(s) within the areas targeted by this assay, and  inadequate number of viral copies(<138 copies/mL). A negative result must be combined with clinical observations, patient history, and epidemiological information. The expected result is Negative.  Fact Sheet for Patients:  EntrepreneurPulse.com.au  Fact Sheet for Healthcare Providers:  IncredibleEmployment.be  This test is no t yet approved or cleared by the [place] FDA and  has been authorized for detection and/or diagnosis of SARS-CoV-2 by FDA under an Emergency Use Authorization (EUA). This EUA will remain  in effect (meaning this test can be used) for the duration of the COVID-19 declaration under Section 564(b)(1) of the Act, 21 U.S.C.section 360bbb-3(b)(1), unless the authorization is terminated  or revoked sooner.       Influenza A by PCR NEGATIVE NEGATIVE   Influenza B by PCR NEGATIVE NEGATIVE    Comment: (NOTE) The Xpert Xpress SARS-CoV-2/FLU/RSV plus assay is intended as an aid in the diagnosis of influenza from Nasopharyngeal swab specimens and should not be used as a sole basis for treatment. Nasal washings and aspirates are unacceptable for Xpert Xpress  SARS-CoV-2/FLU/RSV testing.  Fact Sheet for Patients: EntrepreneurPulse.com.au  Fact Sheet for Healthcare Providers: IncredibleEmployment.be  This test is not yet approved or cleared by the [place] FDA and has been authorized for detection and/or diagnosis of SARS-CoV-2 by FDA under an Emergency Use Authorization (EUA). This EUA will remain in effect (meaning this test can be used) for the duration of the COVID-19 declaration under Section 564(b)(1) of the Act, 21 U.S.C. section 360bbb-3(b)(1), unless the authorization is terminated or revoked.  Performed at [facility], [place] [address] [place], [place] [address]   Troponin I (High Sensitivity)     Status: None   Collection Time: 05/06/21 10:33 PM  Result Value Ref Range   Troponin I (High Sensitivity) 8 <18 ng/L    Comment: (NOTE) Elevated high sensitivity troponin I (hsTnI) values and significant  changes across serial measurements may suggest ACS but many other  chronic and acute conditions are known to elevate hsTnI results.  Refer to the "Links" section for chest pain algorithms and additional  guidance. Performed at [facility], [place] [address] [place], [place] [address]   Hemoglobin A1c     Status: Abnormal   Collection Time: 05/07/21  3:45 AM  Result Value Ref Range   Hgb A1c MFr Bld 7.1 (H) 4.8 - 5.6 %    Comment: (NOTE) Pre diabetes:          5.7%-6.4%  Diabetes:              >6.4%  Glycemic control for   <7.0% adults with diabetes    Mean Plasma Glucose 157.07 mg/dL    Comment: Performed at [facility] [address] [place], [place] [address]  Comprehensive metabolic panel     Status: Abnormal   Collection Time: 05/07/21  3:45 AM  Result Value Ref Range   Sodium 140 135 - 145 mmol/L   Potassium 4.9 3.5 - 5.1 mmol/L   Chloride 104 98 - 111 mmol/L   CO2 25 22 - 32 mmol/L   Glucose, Bld 201 (H) 70 - 99 mg/dL    Comment: Glucose reference  range applies only to samples taken after fasting for at least 8 hours.   BUN 15 8 - 23 mg/dL   Creatinine, Ser 1.14 (H) 0.44 - 1.00 mg/dL   Calcium 9.8 8.9 - 10.3 mg/dL   Total Protein 7.0 6.5 - 8.1 g/dL   Albumin 3.4 (L) 3.5 -  5.0 g/dL   AST 161 (H) 15 - 41 U/L   ALT 98 (H) 0 - 44 U/L   Alkaline Phosphatase 94 38 - 126 U/L   Total Bilirubin 1.2 0.3 - 1.2 mg/dL   GFR, Estimated 47 (L) >60 mL/min    Comment: (NOTE) Calculated using the CKD-EPI Creatinine Equation (2021)    Anion gap 11 5 - 15    Comment: Performed at [facility] [address] [place], [place] [address]  Magnesium     Status: None   Collection Time: 05/07/21  3:45 AM  Result Value Ref Range   Magnesium 2.1 1.7 - 2.4 mg/dL    Comment: Performed at [facility] [address], [place], [place] [address]  CBC WITH DIFFERENTIAL     Status: Abnormal   Collection Time: 05/07/21  3:45 AM  Result Value Ref Range   WBC 9.8 4.0 - 10.5 K/uL   RBC 4.07 3.87 - 5.11 MIL/uL   Hemoglobin 12.5 12.0 - 15.0 g/dL   HCT 38.8 36.0 - 46.0 %   MCV 95.3 80.0 - 100.0 fL   MCH 30.7 26.0 - 34.0 pg   MCHC 32.2 30.0 - 36.0 g/dL   RDW 12.5 11.5 - 15.5 %   Platelets 195 150 - 400 K/uL   nRBC 0.0 0.0 - 0.2 %   Neutrophils Relative % 88 %   Neutro Abs 8.5 (H) 1.7 - 7.7 K/uL   Lymphocytes Relative 7 %   Lymphs Abs 0.7 0.7 - 4.0 K/uL   Monocytes Relative 5 %   Monocytes Absolute 0.5 0.1 - 1.0 K/uL   Eosinophils Relative 0 %   Eosinophils Absolute 0.0 0.0 - 0.5 K/uL   Basophils Relative 0 %   Basophils Absolute 0.0 0.0 - 0.1 K/uL   Immature Granulocytes 0 %   Abs Immature Granulocytes 0.02 0.00 - 0.07 K/uL    Comment: Performed at [facility] [address] [place], [place] [address]  APTT     Status: None   Collection Time: 05/07/21  3:45 AM  Result Value Ref Range   aPTT 26 24 - 36 seconds    Comment: Performed at [facility] [address] [place], [place] [address]  Protime-INR     Status: None    Collection Time: 05/07/21  3:45 AM  Result Value Ref Range   Prothrombin Time 14.4 11.4 - 15.2 seconds   INR 1.1 0.8 - 1.2    Comment: (NOTE) INR goal varies based on device and disease states. Performed at [facility], [place] [address], [place],  [address]   CBG monitoring, ED     Status: Abnormal   Collection Time: 05/07/21  8:29 AM  Result Value Ref Range   Glucose-Capillary 121 (H) 70 - 99 mg/dL    Comment: Glucose reference range applies only to samples taken after fasting for at least 8 hours.    CT ABDOMEN PELVIS W CONTRAST  Result Date: 05/06/2021 CLINICAL DATA:  Epigastric pain, nausea, and vomiting. History of Crohn's colitis. EXAM: CT ABDOMEN AND PELVIS WITH CONTRAST TECHNIQUE: Multidetector CT imaging of the abdomen and pelvis was performed using the standard protocol following bolus administration of intravenous contrast. CONTRAST:  42m OMNIPAQUE IOHEXOL 300 MG/ML  SOLN COMPARISON:  None. FINDINGS: Lower chest: Coarse interstitial scarring in the lung bases. Coronary artery calcifications. Small esophageal hiatal hernia. Hepatobiliary: Cholelithiasis with distended gallbladder. Moderate intrahepatic and extrahepatic bile duct dilatation. A  tiny calcification is suggested in the distal common bile duct suggesting a common duct stone. Pancreas: Unremarkable. No pancreatic ductal dilatation or surrounding inflammatory changes. Spleen: Normal in size without focal abnormality. Adrenals/Urinary Tract: No adrenal gland nodules. Small parenchymal cysts in both kidneys. Scarring in the upper pole of the right kidney. Nephrograms are otherwise symmetrical and homogeneous. No hydronephrosis or hydroureter. Bladder is unremarkable. Stomach/Bowel: Stomach, small bowel, and colon are not abnormally distended. Mild wall thickening in the rectosigmoid and descending colon likely related to the patient's history of colitis. No pericolonic inflammatory changes or abscess is identified.  Appendix is not visualized. Vascular/Lymphatic: Diffuse calcification of the abdominal aorta and celiac/mesenteric branch vessels. No aneurysm. Reproductive: Prostate is unremarkable. Other: No free air or free fluid in the abdomen. Abdominal wall musculature appears intact. Musculoskeletal: Degenerative changes in the lumbar spine. No destructive bone lesions. IMPRESSION: 1. Cholelithiasis and distended gallbladder. Bile duct dilatation with probable common bile duct stone. 2. Coarse interstitial fibrosis in the lung bases. 3. Small esophageal hiatal hernia. 4. Aortic atherosclerosis. 5. Mild diffuse wall thickening of the left colon consistent with patient's history of colitis. Electronically Signed   By: Lucienne Capers M.D.   On: 05/06/2021 22:24   US Abdomen Limited RUQ (LIVER/GB)  Result Date: 05/06/2021 CLINICAL DATA:  Right upper quadrant pain. EXAM: ULTRASOUND ABDOMEN LIMITED RIGHT UPPER QUADRANT COMPARISON:  Abdominal CT earlier today. FINDINGS: Gallbladder: Distended containing multiple gallstones. There is trace pericholecystic fluid. Positive sonographic Murphy sign noted by the sonographer. No definite gallbladder wall thickening. Common bile duct: Diameter: Dilated measuring 8 mm. Probable echogenic stone in the distal common bile duct measuring 9 mm. Liver: There is intrahepatic biliary ductal dilatation. No focal lesion identified. Within normal limits in parenchymal echogenicity. Portal vein is patent on color Doppler imaging with normal direction of blood flow towards the liver. Other: None. IMPRESSION: 1. Gallstones with distended gallbladder, trace pericholecystic fluid, and positive sonographic Murphy sign, suspicious for acute cholecystitis. 2. Intra and extrahepatic biliary ductal dilatation. Probable 9 mm stone in the distal common bile duct. Electronically Signed   By: Keith Rake M.D.   On: 05/06/2021 23:33   *We have personally reviewed her CT scan  A/P: Kara Dean is an  87 y.o. female with HTN, HLD, T2DM, PAD, prior TIA, Crohns (s/p exlap and 'bowel resection' 1990s, no treatment since) - here with ? choledocholithiasis  -Story and clinical picture may support this originating from her gallbladder. She has a normal wbc at this point, no pain on exam and has a normal bilirubin but with possible ductal dilation on her CT/US and additionally a potential common duct stone. -Needs medicine and cardiac clearance to see if clear for an ERCP and then potentially for surgery if she has choledocholithiasis on ERCP. With her complex medical and surgical history including a large laparotomy adhesive disease could place her at increased risks of bowel injury or even open surgery. -We will follow with you  Nadeen [place], MD [facility] Use [email or web address] to contact on call provider

## 2021-05-07 NOTE — Progress Notes (Signed)
Pt admitted by Dr Cyd Silence earlier this am, please see note for detailed H&P.   87 year old female with past medical history of hyperlipidemia, insulin-dependent diabetes mellitus type 2, hypertension, gastroesophageal reflux disease, previous TIA, bilateral carotid artery stenosis as well as reported remote history of Crohn's diagnosed over 30 years ago not on treatment who presents to [facility] emergency department with complaints of abdominal pain nausea and vomiting.   Upon evaluation in the emergency department patient was found to have a mild leukocytosis of 12.5.  CT imaging of the abdomen pelvis revealed evidence of a common bile duct stone.  Right upper quadrant ultrasound was additionally performed revealing evidence of a distended gallbladder with pericholecystic fluid and positive sonographic Murphy sign.  Additionally right upper quadrant ultrasound redemonstrated common bile duct stone.   General surgery and GI consulted. MRCP ordered for further evaluation. Cardiology will be consulted for pre op clearance .  Pt seen and examined at bedside, and discussed the plan with the family at bedside.  Continue with IV fluids, IV antibiotics and IV PPI, anti emetics.    Hosie Poisson, MD

## 2021-05-07 NOTE — Consult Note (Addendum)
Consultation  Referring Provider: Dr. Karleen Hampshire    Primary Care Physician:  [name], FNP Primary Gastroenterologist:   Althia Forts      Reason for Consultation: Possible CBD stone            HPI:   Kara Dean is a 87 y.o. female with a past medical history of diabetes type 2, GERD, previous TIA, bilateral carotid artery stenosis as well as reported remote history of Crohn's diagnosed over 30 years ago not on treatment, who presented to the hospital on 05/06/2021 with complaint of abdominal pain, nausea and vomiting.    Today, patient explains that yesterday she started to experience severe, dull and nonradiating epigastric pain.  Shortly after that start was nausea with frequent bouts of nonbilious nonbloody vomiting.  Describes that she has had similar episodes but with less severe symptoms occurring intermittently over the past year.  The symptoms lasted for several hours until she contacted EMS and was brought to the ER.    Currently in the ER the patient's pain is controlled and she has had no further episodes of vomiting since early hours of the morning.  Tells me she did have a normal bowel movement yesterday but has been having some issues with constipation periodically.    Denies fever, chills or weight loss.  ER course: Mild leukocytosis of 12.5--> 9.8, CT of the abdomen pelvis revealed evidence of a common bile duct stone, right upper quadrant ultrasound was additionally performed revealing evidence of a distended gallbladder with pericholecystic fluid and positive sonographic Murphy sign, as well as CBD stone; AST 114--> 161, total bilirubin 1.6-->1.2, ALT 52-->98, normal alk phos  GI history:  Distant colonoscopy at [facility], cannot see records in our system currently.  Past Medical History:  Diagnosis Date   Anxiety    Carotid artery disease ([place])    Carotid US 11/2018:  bilateral ICA 1-39 >> repeat 2 years.    Cataract    Crohn's colitis ([place])    CVA  (cerebral infarction)    No deficits   Depression    Diabetes mellitus    type 2   Dyslipidemia    GERD (gastroesophageal reflux disease)    History of TIAs    no deficits   Hyperlipidemia    Hypertension    Hyponatremia    Nodular basal cell carcinoma (BCC) 04/22/2018   left prox, nasal root (tx p bx)   Osteoarthritis    SCC (squamous cell carcinoma) 04/22/2018   left shin,superior-(tx p bx), Left shin,inferior-(tx p bx )    Past Surgical History:  Procedure Laterality Date   ABDOMINAL HYSTERECTOMY     BACK SURGERY  1994   CATARACT EXTRACTION W/PHACO Left 09/19/2017   Procedure: CATARACT EXTRACTION PHACO AND INTRAOCULAR LENS PLACEMENT ([place]);  Surgeon: Baruch Goldmann, MD;  Location: [facility];  Service: Ophthalmology;  Laterality: Left;  CDE: 8.10   CATARACT EXTRACTION W/PHACO Right 10/10/2017   Procedure: CATARACT EXTRACTION PHACO AND INTRAOCULAR LENS PLACEMENT RIGHT EYE;  Surgeon: Baruch Goldmann, MD;  Location: [facility];  Service: Ophthalmology;  Laterality: Right;  CDE: 9.60   COLON RESECTION  1990's   TUBAL LIGATION     VESICOVAGINAL FISTULA CLOSURE W/ TAH  1984    Family History  Problem Relation Age of Onset   Coronary artery disease Sister        stent x 3   Seizures Sister    Coronary artery disease Brother  stent   Hypertension Mother    Stroke Mother    Emphysema Father    Alcohol abuse Brother    Lung disease Brother    Heart attack Daughter      Social History   Tobacco Use   Smoking status: Never   Smokeless tobacco: Never  Vaping Use   Vaping Use: Never used  Substance Use Topics   Alcohol use: No   Drug use: No    Prior to Admission medications   Medication Sig Start Date End Date Taking? Authorizing Provider  amLODipine (NORVASC) 10 MG tablet Take 1 tablet (10 mg total) by mouth daily. 05/01/21  Yes Hassell Done, Mary-Margaret, FNP  atorvastatin (LIPITOR) 10 MG tablet Take 1 tablet (10 mg total) by mouth daily. (NEEDS TO BE SEEN BEFORE NEXT  REFILL) 05/01/21  Yes Hassell Done, Mary-Margaret, FNP  B Complex Vitamins (B COMPLEX-B12) TABS Take 1 tablet by mouth daily.   Yes [provider]  Cholecalciferol (VITAMIN D3 PO) Take 1 capsule daily by mouth.   Yes [provider]  citalopram (CELEXA) 20 MG tablet Take 1 tablet (20 mg total) by mouth daily. (NEEDS TO BE SEEN BEFORE NEXT REFILL) 05/01/21  Yes Hassell Done, Mary-Margaret, FNP  dipyridamole-aspirin (AGGRENOX) 200-25 MG 12hr capsule TAKE  (1)  CAPSULE  TWICE DAILY. Patient taking differently: Take 1 capsule by mouth 2 (two) times daily. 05/01/21  Yes Martin, Mary-Margaret, FNP  fluticasone (FLONASE) 50 MCG/ACT nasal spray USE 2 SPRAYS IN EACH NOSTRIL ONCE DAILY. Patient taking differently: Place 2 sprays into both nostrils as needed for allergies or rhinitis. 10/13/20  Yes Martin, Mary-Margaret, FNP  furosemide (LASIX) 20 MG tablet Take 1 tablet (20 mg total) by mouth as directed. Mon, Wed, and Fri only. Patient taking differently: Take 20 mg by mouth 3 (three) times a week. Mon, Wed, and Fri only. 05/01/21  Yes Martin, Mary-Margaret, FNP  glucose blood (ONETOUCH ULTRA) test strip Check BS up to 3 times daily Dx E11.9 Patient taking differently: 1 each by Other route in the morning, at noon, and at bedtime. 10/13/20  Yes Martin, Mary-Margaret, FNP  LANTUS SOLOSTAR 100 UNIT/ML Solostar Pen Inject 40 Units into the skin daily. 02/26/21  Yes [provider]  LORazepam (ATIVAN) 0.5 MG tablet Take 1 tablet (0.5 mg total) by mouth 2 (two) times daily. 05/01/21  Yes Martin, Mary-Margaret, FNP  Magnesium 250 MG TABS Take 250 mg by mouth daily.   Yes [provider]  metoprolol succinate (TOPROL-XL) 100 MG 24 hr tablet TAKE 1 TABLET ONCE DAILY WITH A MEAL Patient taking differently: Take 100 mg by mouth daily. 05/01/21  Yes Martin, Mary-Margaret, FNP  olmesartan (BENICAR) 40 MG tablet Take 1 tablet (40 mg total) by mouth daily. 05/01/21  Yes Hassell Done, Mary-Margaret, FNP   Omega-3 Fatty Acids (FISH OIL) 1000 MG CAPS Take 1 capsule by mouth daily.   Yes [provider]  pantoprazole (PROTONIX) 40 MG tablet Take 1 tablet (40 mg total) by mouth daily. (NEEDS TO BE SEEN BEFORE NEXT REFILL) 05/01/21  Yes Hassell Done, Mary-Margaret, FNP  potassium chloride (KLOR-CON) 10 MEQ tablet Take 1 tablet (10 mEq total) by mouth as directed. Mon, Wed, and Fri only. 05/01/21  Yes Hassell Done, Mary-Margaret, FNP    Current Facility-Administered Medications  Medication Dose Route Frequency Provider Last Rate Last Admin   acetaminophen (TYLENOL) tablet 650 mg  650 mg Oral Q6H PRN Shalhoub, Sherryll Burger, MD       Or   acetaminophen (TYLENOL) suppository 650 mg  650 mg Rectal Q6H PRN Shalhoub, Sherryll Burger, MD       amLODipine (NORVASC) tablet 10 mg  10 mg Oral Daily Shalhoub, Sherryll Burger, MD       atorvastatin (LIPITOR) tablet 10 mg  10 mg Oral Daily Shalhoub, Sherryll Burger, MD       cefTRIAXone (ROCEPHIN) 2 g in sodium chloride 0.9 % 100 mL IVPB  2 g Intravenous Q24H Shalhoub, Sherryll Burger, MD       citalopram (CELEXA) tablet 20 mg  20 mg Oral Daily Shalhoub, Sherryll Burger, MD       fentaNYL (SUBLIMAZE) injection 12.5 mcg  12.5 mcg Intravenous Q2H PRN Shalhoub, Sherryll Burger, MD       Or   fentaNYL (SUBLIMAZE) injection 25 mcg  25 mcg Intravenous Q2H PRN Shalhoub, Sherryll Burger, MD       hydrALAZINE (APRESOLINE) injection 10 mg  10 mg Intravenous Q6H PRN Shalhoub, Sherryll Burger, MD       insulin aspart (novoLOG) injection 0-9 Units  0-9 Units Subcutaneous TID AC & HS Shalhoub, Sherryll Burger, MD       insulin glargine (LANTUS) injection 20 Units  20 Units Subcutaneous Daily Shalhoub, Sherryll Burger, MD       irbesartan (AVAPRO) tablet 300 mg  300 mg Oral Daily Shalhoub, Sherryll Burger, MD       lactated ringers infusion   Intravenous Continuous Shalhoub, Sherryll Burger, MD 75 mL/hr at 05/07/21 0308 New Bag at 05/07/21 0308   LORazepam (ATIVAN) tablet 0.5 mg  0.5 mg Oral BID PRN Vernelle Emerald, MD       metoprolol succinate (TOPROL-XL) 24 hr  tablet 100 mg  100 mg Oral Daily Shalhoub, Sherryll Burger, MD       metroNIDAZOLE (FLAGYL) IVPB 500 mg  500 mg Intravenous Q8H Shalhoub, Sherryll Burger, MD   Stopped at 05/07/21 0649   ondansetron (ZOFRAN) injection 4 mg  4 mg Intravenous Q6H PRN Vernelle Emerald, MD   4 mg at 05/07/21 0153   pantoprazole (PROTONIX) injection 40 mg  40 mg Intravenous Daily Shalhoub, Sherryll Burger, MD       polyethylene glycol (MIRALAX / GLYCOLAX) packet 17 g  17 g Oral Daily PRN Shalhoub, Sherryll Burger, MD       Current Outpatient Medications  Medication Sig Dispense Refill   amLODipine (NORVASC) 10 MG tablet Take 1 tablet (10 mg total) by mouth daily. 90 tablet 1   atorvastatin (LIPITOR) 10 MG tablet Take 1 tablet (10 mg total) by mouth daily. (NEEDS TO BE SEEN BEFORE NEXT REFILL) 90 tablet 1   B Complex Vitamins (B COMPLEX-B12) TABS Take 1 tablet by mouth daily.     Cholecalciferol (VITAMIN D3 PO) Take 1 capsule daily by mouth.     citalopram (CELEXA) 20 MG tablet Take 1 tablet (20 mg total) by mouth daily. (NEEDS TO BE SEEN BEFORE NEXT REFILL) 90 tablet 1   dipyridamole-aspirin (AGGRENOX) 200-25 MG 12hr capsule TAKE  (1)  CAPSULE  TWICE DAILY. (Patient taking differently: Take 1 capsule by mouth 2 (two) times daily.) 180 capsule 1   fluticasone (FLONASE) 50 MCG/ACT nasal spray USE 2 SPRAYS IN EACH NOSTRIL ONCE DAILY. (Patient taking differently: Place 2 sprays into both nostrils as needed for allergies or rhinitis.) 16 g 5   furosemide (LASIX) 20 MG tablet Take 1 tablet (20 mg total) by mouth as directed. Mon, Wed, and Fri only. (Patient taking differently: Take 20 mg by mouth 3 (three) times a week.  Mon, Wed, and Fri only.) 90 tablet 1   glucose blood (ONETOUCH ULTRA) test strip Check BS up to 3 times daily Dx E11.9 (Patient taking differently: 1 each by Other route in the morning, at noon, and at bedtime.) 300 strip 3   LANTUS SOLOSTAR 100 UNIT/ML Solostar Pen Inject 40 Units into the skin daily.     LORazepam (ATIVAN) 0.5 MG  tablet Take 1 tablet (0.5 mg total) by mouth 2 (two) times daily. 60 tablet 5   Magnesium 250 MG TABS Take 250 mg by mouth daily.     metoprolol succinate (TOPROL-XL) 100 MG 24 hr tablet TAKE 1 TABLET ONCE DAILY WITH A MEAL (Patient taking differently: Take 100 mg by mouth daily.) 90 tablet 1   olmesartan (BENICAR) 40 MG tablet Take 1 tablet (40 mg total) by mouth daily. 90 tablet 1   Omega-3 Fatty Acids (FISH OIL) 1000 MG CAPS Take 1 capsule by mouth daily.     pantoprazole (PROTONIX) 40 MG tablet Take 1 tablet (40 mg total) by mouth daily. (NEEDS TO BE SEEN BEFORE NEXT REFILL) 90 tablet 1   potassium chloride (KLOR-CON) 10 MEQ tablet Take 1 tablet (10 mEq total) by mouth as directed. Mon, Wed, and Fri only. 90 tablet 1    Allergies as of 05/06/2021 - Review Complete 05/01/2021  Allergen Reaction Noted   Codeine Other (See Comments) 02/01/2009   Latex Rash 02/01/2009   Morphine Other (See Comments) 02/01/2009   Niacin Other (See Comments) 02/01/2009   Sulfa antibiotics Rash 02/01/2009   Sulfonamide derivatives Rash 02/01/2009     Review of Systems:    Constitutional: No weight loss, fever or chills Skin: No rash  Cardiovascular: No chest pain Respiratory: No SOB  Gastrointestinal: See HPI and otherwise negative Genitourinary: No dysuria  Neurological: No headache, dizziness or syncope Musculoskeletal: No new muscle or joint pain Hematologic: No bleeding  Psychiatric: No history of depression or anxiety    Physical Exam:  Vital signs in last 24 hours: Temp:  [97.9 F (36.6 C)] 97.9 F (36.6 C) (07/03 2029) Pulse Rate:  [54-64] 59 (07/04 0700) Resp:  [14-31] 15 (07/04 0700) BP: (103-177)/(36-68) 117/39 (07/04 0700) SpO2:  [92 %-99 %] 95 % (07/04 0700) Weight:  [56.2 kg] 56.2 kg (07/03 2028)   General:   Pleasant Elderly Caucasian female appears to be in NAD, Well developed, Well nourished, alert and cooperative Head:  Normocephalic and atraumatic. Eyes:   PEERL, EOMI. No  icterus. Conjunctiva pink. Ears:  Normal auditory acuity. Neck:  Supple Throat: Oral cavity and pharynx without inflammation, swelling or lesion.  Lungs: Respirations even and unlabored. Lungs clear to auscultation bilaterally.   No wheezes, crackles, or rhonchi.  Heart: Normal S1, S2. No MRG. Regular rate and rhythm. No peripheral edema, cyanosis or pallor.  Abdomen:  Soft, nondistended, Moderate RUQ ttp, No rebound or guarding. Normal bowel sounds. No appreciable masses or hepatomegaly. Rectal:  Not performed.  Msk:  Symmetrical without gross deformities. Peripheral pulses intact.  Extremities:  Without edema, no deformity or joint abnormality.  Neurologic:  Alert and  oriented x4;  grossly normal neurologically.  Skin:   Dry and intact without significant lesions or rashes. Psychiatric: Demonstrates good judgement and reason without abnormal affect or behaviors.   LAB RESULTS: Recent Labs    05/06/21 2033 05/07/21 0345  WBC 12.5* 9.8  HGB 11.4* 12.5  HCT 35.4* 38.8  PLT 192 195   BMET Recent Labs    05/06/21 2033 05/07/21  0345  NA 139 140  K 4.2 4.9  CL 106 104  CO2 25 25  GLUCOSE 157* 201*  BUN 18 15  CREATININE 1.20* 1.14*  CALCIUM 9.6 9.8   LFT Recent Labs    05/07/21 0345  PROT 7.0  ALBUMIN 3.4*  AST 161*  ALT 98*  ALKPHOS 94  BILITOT 1.2   PT/INR Recent Labs    05/06/21 2033 05/07/21 0345  LABPROT 14.5 14.4  INR 1.1 1.1    STUDIES: CT ABDOMEN PELVIS W CONTRAST  Result Date: 05/06/2021 CLINICAL DATA:  Epigastric pain, nausea, and vomiting. History of Crohn's colitis. EXAM: CT ABDOMEN AND PELVIS WITH CONTRAST TECHNIQUE: Multidetector CT imaging of the abdomen and pelvis was performed using the standard protocol following bolus administration of intravenous contrast. CONTRAST:  49m OMNIPAQUE IOHEXOL 300 MG/ML  SOLN COMPARISON:  None. FINDINGS: Lower chest: Coarse interstitial scarring in the lung bases. Coronary artery calcifications. Small  esophageal hiatal hernia. Hepatobiliary: Cholelithiasis with distended gallbladder. Moderate intrahepatic and extrahepatic bile duct dilatation. A tiny calcification is suggested in the distal common bile duct suggesting a common duct stone. Pancreas: Unremarkable. No pancreatic ductal dilatation or surrounding inflammatory changes. Spleen: Normal in size without focal abnormality. Adrenals/Urinary Tract: No adrenal gland nodules. Small parenchymal cysts in both kidneys. Scarring in the upper pole of the right kidney. Nephrograms are otherwise symmetrical and homogeneous. No hydronephrosis or hydroureter. Bladder is unremarkable. Stomach/Bowel: Stomach, small bowel, and colon are not abnormally distended. Mild wall thickening in the rectosigmoid and descending colon likely related to the patient's history of colitis. No pericolonic inflammatory changes or abscess is identified. Appendix is not visualized. Vascular/Lymphatic: Diffuse calcification of the abdominal aorta and celiac/mesenteric branch vessels. No aneurysm. Reproductive: Prostate is unremarkable. Other: No free air or free fluid in the abdomen. Abdominal wall musculature appears intact. Musculoskeletal: Degenerative changes in the lumbar spine. No destructive bone lesions. IMPRESSION: 1. Cholelithiasis and distended gallbladder. Bile duct dilatation with probable common bile duct stone. 2. Coarse interstitial fibrosis in the lung bases. 3. Small esophageal hiatal hernia. 4. Aortic atherosclerosis. 5. Mild diffuse wall thickening of the left colon consistent with patient's history of colitis. Electronically Signed   By: WLucienne CapersM.D.   On: 05/06/2021 22:24   [place]oreaAbdomen Limited RUQ (LIVER/GB)  Result Date: 05/06/2021 CLINICAL DATA:  Right upper quadrant pain. EXAM: ULTRASOUND ABDOMEN LIMITED RIGHT UPPER QUADRANT COMPARISON:  Abdominal CT earlier today. FINDINGS: Gallbladder: Distended containing multiple gallstones. There is trace  pericholecystic fluid. Positive sonographic Murphy sign noted by the sonographer. No definite gallbladder wall thickening. Common bile duct: Diameter: Dilated measuring 8 mm. Probable echogenic stone in the distal common bile duct measuring 9 mm. Liver: There is intrahepatic biliary ductal dilatation. No focal lesion identified. Within normal limits in parenchymal echogenicity. Portal vein is patent on color Doppler imaging with normal direction of blood flow towards the liver. Other: None. IMPRESSION: 1. Gallstones with distended gallbladder, trace pericholecystic fluid, and positive sonographic Murphy sign, suspicious for acute cholecystitis. 2. Intra and extrahepatic biliary ductal dilatation. Probable 9 mm stone in the distal common bile duct. Electronically Signed   By: MKeith RakeM.D.   On: 05/06/2021 23:33     Impression / Plan:   Impression: 1.  Acute cholecystitis + choledocholithiasis: See imaging as above, as well as elevated LFTs, patient started with epigastric/right upper quadrant pain on 05/06/2021 as well as nausea and vomiting 2.  Abdominal pain: With above 3.  GERD: Controlled with Protonix 4.  CKD stage III  Plan: 1.  Recommend consultation to surgical team.  If they plan on taking her to the OR for cholecystectomy recommend IOC.  If positive we will proceed with ERCP.  If surgical team does not plan on cholecystectomy then recommend MRCP for further evaluation of bile duct stone given that patient would be high risk for procedure. 2.  For now patient should remain n.p.o. until surgical team has a plan.  If they do not proceed to surgery today patient can likely be on clears until midnight, then n.p.o. 3.  Continue to monitor LFTs 4.  Continue supportive measures including analgesics and antiemetics 5.  Please awaiting further recommendations from Dr. Silverio Decamp later today.   Thank you for your kind consultation, we will continue to follow.  Kara Dean [facility]  05/07/2021,  8:56 AM   Attending physician's note   I have taken a history, examined the patient and reviewed the chart. I agree with the Advanced Practitioner's note, impression and recommendations.  87 year old female with history of bilateral carotid stenosis, TIA, type 2 diabetes admitted with right upper quadrant abdominal pain, nausea and vomiting CT concerning for cholelithiasis with cholecystitis, intra and extrahepatic dilation and possible distal CBD stone  Discussed case with Dr. Watt Climes, ERCP coverage for this weekend. He felt given her age and comorbidities he would like to defer ERCP at this point,  CT and ultrasound is not conclusive for definite CBD stone.  He recommended IOC during cholecystectomy and can plan to proceed with ERCP if needed after cholecystectomy.  If surgery does not want to proceed with cholecystectomy, will plan to obtain MRCP to further delineate the area  WBC count improved from 12--->9, afebrile with improvement of abdominal pain Normal bilirubin and alk phos, no evidence of biliary obstruction or cholangitis  Continue IV fluids Continue antibiotics Monitor LFT  GI will continue to follow along, please call with any questions  The patient was provided an opportunity to ask questions and all were answered. The patient agreed with the plan and demonstrated an understanding of the instructions.  Damaris Hippo , MD [phone]

## 2021-05-08 DIAGNOSIS — R7989 Other specified abnormal findings of blood chemistry: Secondary | ICD-10-CM | POA: Diagnosis not present

## 2021-05-08 DIAGNOSIS — Z01810 Encounter for preprocedural cardiovascular examination: Secondary | ICD-10-CM

## 2021-05-08 DIAGNOSIS — R1011 Right upper quadrant pain: Secondary | ICD-10-CM | POA: Diagnosis not present

## 2021-05-08 DIAGNOSIS — K8042 Calculus of bile duct with acute cholecystitis without obstruction: Secondary | ICD-10-CM | POA: Diagnosis not present

## 2021-05-08 DIAGNOSIS — I10 Essential (primary) hypertension: Secondary | ICD-10-CM

## 2021-05-08 DIAGNOSIS — R109 Unspecified abdominal pain: Secondary | ICD-10-CM

## 2021-05-08 DIAGNOSIS — N1831 Chronic kidney disease, stage 3a: Secondary | ICD-10-CM

## 2021-05-08 DIAGNOSIS — I5032 Chronic diastolic (congestive) heart failure: Secondary | ICD-10-CM

## 2021-05-08 LAB — COMPREHENSIVE METABOLIC PANEL
ALT: 55 U/L — ABNORMAL HIGH (ref 0–44)
AST: 52 U/L — ABNORMAL HIGH (ref 15–41)
Albumin: 3 g/dL — ABNORMAL LOW (ref 3.5–5.0)
Alkaline Phosphatase: 73 U/L (ref 38–126)
Anion gap: 7 (ref 5–15)
BUN: 12 mg/dL (ref 8–23)
CO2: 25 mmol/L (ref 22–32)
Calcium: 9.1 mg/dL (ref 8.9–10.3)
Chloride: 108 mmol/L (ref 98–111)
Creatinine, Ser: 0.94 mg/dL (ref 0.44–1.00)
GFR, Estimated: 59 mL/min — ABNORMAL LOW (ref >60)
Glucose, Bld: 109 mg/dL — ABNORMAL HIGH (ref 70–99)
Potassium: 3.6 mmol/L (ref 3.5–5.1)
Sodium: 140 mmol/L (ref 135–145)
Total Bilirubin: 0.7 mg/dL (ref 0.3–1.2)
Total Protein: 6.1 g/dL — ABNORMAL LOW (ref 6.5–8.1)

## 2021-05-08 LAB — GLUCOSE, CAPILLARY
Glucose-Capillary: 89 mg/dL (ref 70–99)
Glucose-Capillary: 117 mg/dL — ABNORMAL HIGH (ref 70–99)
Glucose-Capillary: 146 mg/dL — ABNORMAL HIGH (ref 70–99)
Glucose-Capillary: 203 mg/dL — ABNORMAL HIGH (ref 70–99)

## 2021-05-08 MED ORDER — PANTOPRAZOLE SODIUM 40 MG PO TBEC
40.0000 mg | DELAYED_RELEASE_TABLET | Freq: Every day | ORAL | Status: DC
  Administered 2021-05-09 – 2021-05-12 (×3): 40 mg via ORAL
  Filled 2021-05-08 (×3): qty 1

## 2021-05-08 NOTE — Progress Notes (Signed)
Patient had a hypoglycemic event, BSL of 50, says she is not feeling so good. She is NPO for possible ERCP tomorrow. Informed MD, given D50 IV as ordered, held due insulin dose. Changed IVF to D5LR at 48m/hr as ordered. BSL went up to 224, then 128 respectively. MD oredered to continue holding Insulin for now  protocol.

## 2021-05-08 NOTE — Progress Notes (Signed)
Subjective/Chief Complaint: Feeling better this morning   Objective: Vital signs in last 24 hours: Temp:  [98.2 F (36.8 C)-98.7 F (37.1 C)] 98.7 F (37.1 C) (07/05 0400) Pulse Rate:  [45-61] 60 (07/05 0400) Resp:  [15-24] 18 (07/05 0400) BP: (116-171)/(37-89) 162/45 (07/05 0400) SpO2:  [91 %-98 %] 91 % (07/05 0400)    Intake/Output from previous day: 07/04 0701 - 07/05 0700 In: 200 [IV Piggyback:200] Out: 250 [Urine:250] Intake/Output this shift: No intake/output data recorded.  Alert, cooperative Unlabored respirations Abdomen soft, mild TTP RUQ. Midline incision from xyphoid to pubis well healed  Lab Results:  Recent Labs    05/06/21 2033 05/07/21 0345  WBC 12.5* 9.8  HGB 11.4* 12.5  HCT 35.4* 38.8  PLT 192 195   BMET Recent Labs    05/06/21 2033 05/07/21 0345  NA 139 140  K 4.2 4.9  CL 106 104  CO2 25 25  GLUCOSE 157* 201*  BUN 18 15  CREATININE 1.20* 1.14*  CALCIUM 9.6 9.8   PT/INR Recent Labs    05/06/21 2033 05/07/21 0345  LABPROT 14.5 14.4  INR 1.1 1.1   ABG No results for input(s): PHART, HCO3 in the last 72 hours.  Invalid input(s): PCO2, PO2  Studies/Results: CT ABDOMEN PELVIS W CONTRAST  Result Date: 05/06/2021 CLINICAL DATA:  Epigastric pain, nausea, and vomiting. History of Crohn's colitis. EXAM: CT ABDOMEN AND PELVIS WITH CONTRAST TECHNIQUE: Multidetector CT imaging of the abdomen and pelvis was performed using the standard protocol following bolus administration of intravenous contrast. CONTRAST:  73m OMNIPAQUE IOHEXOL 300 MG/ML  SOLN COMPARISON:  None. FINDINGS: Lower chest: Coarse interstitial scarring in the lung bases. Coronary artery calcifications. Small esophageal hiatal hernia. Hepatobiliary: Cholelithiasis with distended gallbladder. Moderate intrahepatic and extrahepatic bile duct dilatation. A tiny calcification is suggested in the distal common bile duct suggesting a common duct stone. Pancreas: Unremarkable. No  pancreatic ductal dilatation or surrounding inflammatory changes. Spleen: Normal in size without focal abnormality. Adrenals/Urinary Tract: No adrenal gland nodules. Small parenchymal cysts in both kidneys. Scarring in the upper pole of the right kidney. Nephrograms are otherwise symmetrical and homogeneous. No hydronephrosis or hydroureter. Bladder is unremarkable. Stomach/Bowel: Stomach, small bowel, and colon are not abnormally distended. Mild wall thickening in the rectosigmoid and descending colon likely related to the patient's history of colitis. No pericolonic inflammatory changes or abscess is identified. Appendix is not visualized. Vascular/Lymphatic: Diffuse calcification of the abdominal aorta and celiac/mesenteric branch vessels. No aneurysm. Reproductive: Prostate is unremarkable. Other: No free air or free fluid in the abdomen. Abdominal wall musculature appears intact. Musculoskeletal: Degenerative changes in the lumbar spine. No destructive bone lesions. IMPRESSION: 1. Cholelithiasis and distended gallbladder. Bile duct dilatation with probable common bile duct stone. 2. Coarse interstitial fibrosis in the lung bases. 3. Small esophageal hiatal hernia. 4. Aortic atherosclerosis. 5. Mild diffuse wall thickening of the left colon consistent with patient's history of colitis. Electronically Signed   By: WLucienne CapersM.D.   On: 05/06/2021 22:24   MR ABDOMEN MRCP W WO CONTAST  Result Date: 05/07/2021 CLINICAL DATA:  Cholelithiasis. Biliary ductal dilatation on recent CT. EXAM: MRI ABDOMEN WITHOUT AND WITH CONTRAST (INCLUDING MRCP) TECHNIQUE: Multiplanar multisequence MR imaging of the abdomen was performed both before and after the administration of intravenous contrast. Heavily T2-weighted images of the biliary and pancreatic ducts were obtained, and three-dimensional MRCP images were rendered by post processing. CONTRAST:  5.541mGADAVIST GADOBUTROL 1 MMOL/ML IV SOLN COMPARISON:  CT on 05/06/2021  FINDINGS: Lower chest: No acute findings. Hepatobiliary: No hepatic masses identified. Tiny sub-cm cyst noted in posterior right hepatic lobe. Numerous tiny gallstones are seen. The gallbladder is distended, and there is mild diffuse gallbladder wall thickening and small amount of pericholecystic fluid. These findings are suspicious for acute cholecystitis. No evidence of biliary ductal dilatation, with common bile duct measuring 5 mm. No evidence of choledocholithiasis or biliary stricture. Mild perihepatic ascites noted. Pancreas: A simple appearing cyst is seen in the pancreatic uncinate process measuring 1.2 x 1.0 cm on image 26/5. No evidence of pancreatic ductal dilatation. Spleen:  Within normal limits in size and appearance. Adrenals/Urinary Tract: 1.9 cm subcapsular cyst is noted in the upper pole of the left kidney. Mild right renal parenchymal scarring again seen. Stomach/Bowel: Visualized portion unremarkable. Vascular/Lymphatic: No pathologically enlarged lymph nodes identified. No acute vascular findings. Other:  None. Musculoskeletal:  No suspicious bone lesions identified. IMPRESSION: Cholelithiasis, with findings suspicious for acute cholecystitis. No evidence of biliary ductal dilatation or choledocholithiasis. Mild perihepatic ascites. 1.2 cm simple appearing cyst in the pancreatic uncinate process, likely representing an indolent cystic pancreatic neoplasm such as a side-branch IPMN. In patient of this age, continued follow-up by MRI is recommended in 2 years. This recommendation follows ACR consensus guidelines: Management of Incidental Pancreatic Cysts: A White Paper of the ACR Incidental Findings Committee. [place] 3154;00:[id number]. Electronically Signed   By: Marlaine Hind M.D.   On: 05/07/2021 16:00   US Abdomen Limited RUQ (LIVER/GB)  Result Date: 05/06/2021 CLINICAL DATA:  Right upper quadrant pain. EXAM: ULTRASOUND ABDOMEN LIMITED RIGHT UPPER QUADRANT COMPARISON:  Abdominal CT  earlier today. FINDINGS: Gallbladder: Distended containing multiple gallstones. There is trace pericholecystic fluid. Positive sonographic Murphy sign noted by the sonographer. No definite gallbladder wall thickening. Common bile duct: Diameter: Dilated measuring 8 mm. Probable echogenic stone in the distal common bile duct measuring 9 mm. Liver: There is intrahepatic biliary ductal dilatation. No focal lesion identified. Within normal limits in parenchymal echogenicity. Portal vein is patent on color Doppler imaging with normal direction of blood flow towards the liver. Other: None. IMPRESSION: 1. Gallstones with distended gallbladder, trace pericholecystic fluid, and positive sonographic Murphy sign, suspicious for acute cholecystitis. 2. Intra and extrahepatic biliary ductal dilatation. Probable 9 mm stone in the distal common bile duct. Electronically Signed   By: Keith Rake M.D.   On: 05/06/2021 23:33    Anti-infectives: Anti-infectives (From admission, onward)    Start     Dose/Rate Route Frequency Ordered Stop   05/07/21 2215  cefTRIAXone (ROCEPHIN) 2 g in sodium chloride 0.9 % 100 mL IVPB       See Hyperspace for full Linked Orders Report.   2 g 200 mL/hr over 30 Minutes Intravenous Every 24 hours 05/07/21 0158     05/07/21 0600  metroNIDAZOLE (FLAGYL) IVPB 500 mg        500 mg 100 mL/hr over 60 Minutes Intravenous Every 8 hours 05/07/21 0158     05/07/21 0215  cefTRIAXone (ROCEPHIN) 1 g in sodium chloride 0.9 % 100 mL IVPB       See Hyperspace for full Linked Orders Report.   1 g 200 mL/hr over 30 Minutes Intravenous  Once 05/07/21 0158 05/07/21 0304   05/06/21 2330  cefTRIAXone (ROCEPHIN) 1 g in sodium chloride 0.9 % 100 mL IVPB        1 g 200 mL/hr over 30 Minutes Intravenous  Once 05/06/21 2319 05/07/21 0025   05/06/21 2330  metroNIDAZOLE (FLAGYL) IVPB 500 mg        500 mg 100 mL/hr over 60 Minutes Intravenous  Once 05/06/21 2319 05/07/21 0145       Assessment/Plan: TRYNITI LAATSCH is an 87 y.o. female with HTN, HLD, T2DM, PAD, prior TIA, Crohns (s/p exlap and 'bowel resection' 1990s, no treatment since) - here with choledocholithiasis   -Story and clinical picture may support this originating from her gallbladder. She has a normal wbc at this point, no pain on exam and has a normal bilirubin but with possible ductal dilation on her CT/US and additionally a potential common duct stone. -Cardiac clearance pending, MRCP pending. Needs medicine and cardiac clearance to see if clear for an ERCP and then potentially for surgery if she has choledocholithiasis on ERCP. With her complex medical and surgical history including a large laparotomy adhesive disease could place her at increased risks of bowel injury or even open surgery. -We will follow with you   LOS: 1 day    Clovis Riley 05/08/2021

## 2021-05-08 NOTE — Progress Notes (Signed)
PROGRESS NOTE    TAIZ BICKLE  WER:[id number] DOB: 04-Jul-1933 DOA: 05/06/2021 PCP: [name], FNP    No chief complaint on file.   Brief Narrative:  87 year old female with past medical history of hyperlipidemia, insulin-dependent diabetes mellitus type 2, hypertension, gastroesophageal reflux disease, previous TIA, bilateral carotid artery stenosis as well as reported remote history of Crohn's diagnosed over 30 years ago not on treatment who presents to [facility] emergency department with complaints of abdominal pain nausea and vomiting.     Upon evaluation in the emergency department patient was found to have a mild leukocytosis of 12.5.  CT imaging of the abdomen pelvis revealed evidence of a common bile duct stone.  Right upper quadrant ultrasound was additionally performed revealing evidence of a distended gallbladder with pericholecystic fluid and positive sonographic Murphy sign.  Additionally right upper quadrant ultrasound redemonstrated common bile duct stone.     General surgery and GI consulted. MRCP ordered for further evaluation. Cardiology will be consulted for pre op clearance . Pt seen and examined at bedside, and discussed the plan with the family at bedside. Continue with IV fluids, IV antibiotics and IV PPI, anti emetics.  Assessment & Plan:   Principal Problem:   Choledocholithiasis with acute cholecystitis Active Problems:   Essential hypertension   Type 2 diabetes mellitus with stage 3a chronic kidney disease, with long-term current use of insulin (HCC)   Gastroesophageal reflux disease without esophagitis   GAD (generalized anxiety disorder)   Chronic kidney disease (CKD) stage G3a/A3, moderately decreased glomerular filtration rate (GFR) between 45-59 mL/min/1.73 square meter and albuminuria creatinine ratio greater than 300 mg/g (HCC)   Mixed diabetic hyperlipidemia associated with type 2 diabetes mellitus  ([place])    Choledocholithiasis with acute cholecystitis MRCP showed no CBD stones, signs of acute cholecystitis present Continue with broad-spectrum IV antibiotics and IV fluids Patient n.p.o., general surgery and gastroenterology on board Cardiology consulted for preop clearance for ERCP    Essential hypertension Blood pressure parameters well controlled.   Type 2 diabetes mellitus, insulin-dependent with stage IIIa CKD.  Continue with sliding scale insulin and A1c ordered. CBG (last 3)  Recent Labs    05/07/21 2252 05/08/21 0608 05/08/21 1109  GLUCAP 128* 89 117*    GERD without esophagitis Continue with PPI next  Stage IIIa CKD Creatinine better than baseline    Elevated liver enzymes Improving Total bili within normal limits.   DVT prophylaxis: SCD'S Code Status: (Full code) Family Communication: family at bedside.  Disposition:   Status is: Inpatient  Remains inpatient appropriate because:Ongoing diagnostic testing needed not appropriate for outpatient work up  Dispo: The patient is from: Home              Anticipated d/c is to:  pending.               Patient currently is not medically stable to d/c.   Difficult to place patient No       Consultants:  General surgery GI.  Cardiology.   Procedures: MRCP ERCP  Antimicrobials:  Antibiotics Given (last 72 hours)     Date/Time Action Medication Dose Rate   05/06/21 2355 New Bag/Given   cefTRIAXone (ROCEPHIN) 1 g in sodium chloride 0.9 % 100 mL IVPB 1 g 200 mL/hr   05/06/21 2359 New Bag/Given   metroNIDAZOLE (FLAGYL) IVPB 500 mg 500 mg 100 mL/hr   05/07/21 0223 New Bag/Given   cefTRIAXone (ROCEPHIN) 1 g in sodium chloride 0.9 % 100  mL IVPB 1 g 200 mL/hr   05/07/21 0545 New Bag/Given   metroNIDAZOLE (FLAGYL) IVPB 500 mg 500 mg 100 mL/hr   05/07/21 1603 New Bag/Given   metroNIDAZOLE (FLAGYL) IVPB 500 mg 500 mg 100 mL/hr   05/07/21 2053 New Bag/Given   cefTRIAXone (ROCEPHIN) 2 g in sodium  chloride 0.9 % 100 mL IVPB 2 g 200 mL/hr   05/07/21 2119 New Bag/Given   metroNIDAZOLE (FLAGYL) IVPB 500 mg 500 mg 100 mL/hr   05/08/21 0530 New Bag/Given   metroNIDAZOLE (FLAGYL) IVPB 500 mg 500 mg 100 mL/hr   05/08/21 1330 New Bag/Given   metroNIDAZOLE (FLAGYL) IVPB 500 mg 500 mg 100 mL/hr         Subjective: NO nausea, vomiting or abdominal pain. Wants to go home and eat.   Objective: Vitals:   05/07/21 2255 05/08/21 0400 05/08/21 0854 05/08/21 1143  BP: (!) 125/50 (!) 162/45 (!) 174/48 (!) 148/43  Pulse: (!) 57 60  60  Resp: 15 18  18   Temp: 98.5 F (36.9 C) 98.7 F (37.1 C)  99.2 F (37.3 C)  TempSrc: Oral Oral  Oral  SpO2: 95% 91%  91%  Weight:      Height:        Intake/Output Summary (Last 24 hours) at 05/08/2021 1350 Last data filed at 05/08/2021 1003 Gross per 24 hour  Intake 200 ml  Output 800 ml  Net -600 ml   Filed Weights   05/06/21 2028  Weight: 56.2 kg    Examination:  General exam: Appears calm and comfortable  Respiratory system: Clear to auscultation. Respiratory effort normal. Cardiovascular system: S1 & S2 heard, RRR. No JVD, No pedal edema. Gastrointestinal system: Abdomen is nondistended, soft and nontender. Normal bowel sounds heard. Central nervous system: Alert and oriented. No focal neurological deficits. Extremities: Symmetric 5 x 5 power. Skin: No rashes, lesions or ulcers Psychiatry: Mood & affect appropriate.     Data Reviewed: I have personally reviewed following labs and imaging studies  CBC: Recent Labs  Lab 05/06/21 2033 05/07/21 0345  WBC 12.5* 9.8  NEUTROABS 11.1* 8.5*  HGB 11.4* 12.5  HCT 35.4* 38.8  MCV 95.7 95.3  PLT 192 165    Basic Metabolic Panel: Recent Labs  Lab 05/06/21 2033 05/07/21 0345 05/08/21 1032  NA 139 140 140  K 4.2 4.9 3.6  CL 106 104 108  CO2 25 25 25   GLUCOSE 157* 201* 109*  BUN 18 15 12   CREATININE 1.20* 1.14* 0.94  CALCIUM 9.6 9.8 9.1  MG  --  2.1  --     GFR: Estimated  Creatinine Clearance: 34.4 mL/min (by C-G formula based on SCr of 0.94 mg/dL).  Liver Function Tests: Recent Labs  Lab 05/06/21 2033 05/07/21 0345 05/08/21 1032  AST 114* 161* 52*  ALT 52* 98* 55*  ALKPHOS 76 94 73  BILITOT 1.6* 1.2 0.7  PROT 6.8 7.0 6.1*  ALBUMIN 3.6 3.4* 3.0*    CBG: Recent Labs  Lab 05/07/21 2059 05/07/21 2131 05/07/21 2252 05/08/21 0608 05/08/21 1109  GLUCAP 50* 224* 128* 89 117*     Recent Results (from the past 240 hour(s))  Resp Panel by RT-PCR (Flu A&B, Covid) Nasopharyngeal Swab     Status: None   Collection Time: 05/06/21  9:24 PM   Specimen: Nasopharyngeal Swab; Nasopharyngeal(NP) swabs in vial transport medium  Result Value Ref Range Status   SARS Coronavirus 2 by RT PCR NEGATIVE NEGATIVE Final    Comment: (NOTE) SARS-CoV-2  target nucleic acids are NOT DETECTED.  The SARS-CoV-2 RNA is generally detectable in upper respiratory specimens during the acute phase of infection. The lowest concentration of SARS-CoV-2 viral copies this assay can detect is 138 copies/mL. A negative result does not preclude SARS-Cov-2 infection and should not be used as the sole basis for treatment or other patient management decisions. A negative result may occur with  improper specimen collection/handling, submission of specimen other than nasopharyngeal swab, presence of viral mutation(s) within the areas targeted by this assay, and inadequate number of viral copies(<138 copies/mL). A negative result must be combined with clinical observations, patient history, and epidemiological information. The expected result is Negative.  Fact Sheet for Patients:  EntrepreneurPulse.com.au  Fact Sheet for Healthcare Providers:  IncredibleEmployment.be  This test is no t yet approved or cleared by the [place] FDA and  has been authorized for detection and/or diagnosis of SARS-CoV-2 by FDA under an Emergency Use Authorization  (EUA). This EUA will remain  in effect (meaning this test can be used) for the duration of the COVID-19 declaration under Section 564(b)(1) of the Act, 21 U.S.C.section 360bbb-3(b)(1), unless the authorization is terminated  or revoked sooner.       Influenza A by PCR NEGATIVE NEGATIVE Final   Influenza B by PCR NEGATIVE NEGATIVE Final    Comment: (NOTE) The Xpert Xpress SARS-CoV-2/FLU/RSV plus assay is intended as an aid in the diagnosis of influenza from Nasopharyngeal swab specimens and should not be used as a sole basis for treatment. Nasal washings and aspirates are unacceptable for Xpert Xpress SARS-CoV-2/FLU/RSV testing.  Fact Sheet for Patients: EntrepreneurPulse.com.au  Fact Sheet for Healthcare Providers: IncredibleEmployment.be  This test is not yet approved or cleared by the [place] FDA and has been authorized for detection and/or diagnosis of SARS-CoV-2 by FDA under an Emergency Use Authorization (EUA). This EUA will remain in effect (meaning this test can be used) for the duration of the COVID-19 declaration under Section 564(b)(1) of the Act, 21 U.S.C. section 360bbb-3(b)(1), unless the authorization is terminated or revoked.  Performed at [facility], [place] [address], [place], [place] [address]          Radiology Studies: CT ABDOMEN PELVIS W CONTRAST  Result Date: 05/06/2021 CLINICAL DATA:  Epigastric pain, nausea, and vomiting. History of Crohn's colitis. EXAM: CT ABDOMEN AND PELVIS WITH CONTRAST TECHNIQUE: Multidetector CT imaging of the abdomen and pelvis was performed using the standard protocol following bolus administration of intravenous contrast. CONTRAST:  78m OMNIPAQUE IOHEXOL 300 MG/ML  SOLN COMPARISON:  None. FINDINGS: Lower chest: Coarse interstitial scarring in the lung bases. Coronary artery calcifications. Small esophageal hiatal hernia. Hepatobiliary: Cholelithiasis with distended gallbladder.  Moderate intrahepatic and extrahepatic bile duct dilatation. A tiny calcification is suggested in the distal common bile duct suggesting a common duct stone. Pancreas: Unremarkable. No pancreatic ductal dilatation or surrounding inflammatory changes. Spleen: Normal in size without focal abnormality. Adrenals/Urinary Tract: No adrenal gland nodules. Small parenchymal cysts in both kidneys. Scarring in the upper pole of the right kidney. Nephrograms are otherwise symmetrical and homogeneous. No hydronephrosis or hydroureter. Bladder is unremarkable. Stomach/Bowel: Stomach, small bowel, and colon are not abnormally distended. Mild wall thickening in the rectosigmoid and descending colon likely related to the patient's history of colitis. No pericolonic inflammatory changes or abscess is identified. Appendix is not visualized. Vascular/Lymphatic: Diffuse calcification of the abdominal aorta and celiac/mesenteric branch vessels. No aneurysm. Reproductive: Prostate is unremarkable. Other: No free air or free fluid in  the abdomen. Abdominal wall musculature appears intact. Musculoskeletal: Degenerative changes in the lumbar spine. No destructive bone lesions. IMPRESSION: 1. Cholelithiasis and distended gallbladder. Bile duct dilatation with probable common bile duct stone. 2. Coarse interstitial fibrosis in the lung bases. 3. Small esophageal hiatal hernia. 4. Aortic atherosclerosis. 5. Mild diffuse wall thickening of the left colon consistent with patient's history of colitis. Electronically Signed   By: Lucienne Capers M.D.   On: 05/06/2021 22:24   MR ABDOMEN MRCP W WO CONTAST  Result Date: 05/07/2021 CLINICAL DATA:  Cholelithiasis. Biliary ductal dilatation on recent CT. EXAM: MRI ABDOMEN WITHOUT AND WITH CONTRAST (INCLUDING MRCP) TECHNIQUE: Multiplanar multisequence MR imaging of the abdomen was performed both before and after the administration of intravenous contrast. Heavily T2-weighted images of the biliary and  pancreatic ducts were obtained, and three-dimensional MRCP images were rendered by post processing. CONTRAST:  5.77m GADAVIST GADOBUTROL 1 MMOL/ML IV SOLN COMPARISON:  CT on 05/06/2021 FINDINGS: Lower chest: No acute findings. Hepatobiliary: No hepatic masses identified. Tiny sub-cm cyst noted in posterior right hepatic lobe. Numerous tiny gallstones are seen. The gallbladder is distended, and there is mild diffuse gallbladder wall thickening and small amount of pericholecystic fluid. These findings are suspicious for acute cholecystitis. No evidence of biliary ductal dilatation, with common bile duct measuring 5 mm. No evidence of choledocholithiasis or biliary stricture. Mild perihepatic ascites noted. Pancreas: A simple appearing cyst is seen in the pancreatic uncinate process measuring 1.2 x 1.0 cm on image 26/5. No evidence of pancreatic ductal dilatation. Spleen:  Within normal limits in size and appearance. Adrenals/Urinary Tract: 1.9 cm subcapsular cyst is noted in the upper pole of the left kidney. Mild right renal parenchymal scarring again seen. Stomach/Bowel: Visualized portion unremarkable. Vascular/Lymphatic: No pathologically enlarged lymph nodes identified. No acute vascular findings. Other:  None. Musculoskeletal:  No suspicious bone lesions identified. IMPRESSION: Cholelithiasis, with findings suspicious for acute cholecystitis. No evidence of biliary ductal dilatation or choledocholithiasis. Mild perihepatic ascites. 1.2 cm simple appearing cyst in the pancreatic uncinate process, likely representing an indolent cystic pancreatic neoplasm such as a side-branch IPMN. In patient of this age, continued follow-up by MRI is recommended in 2 years. This recommendation follows ACR consensus guidelines: Management of Incidental Pancreatic Cysts: A White Paper of the ACR Incidental Findings Committee. [id number];[id number] Electronically Signed   By: JMarlaine HindM.D.   On: 05/07/2021 16:00    [place]domen Limited RUQ (LIVER/GB)  Result Date: 05/06/2021 CLINICAL DATA:  Right upper quadrant pain. EXAM: ULTRASOUND ABDOMEN LIMITED RIGHT UPPER QUADRANT COMPARISON:  Abdominal CT earlier today. FINDINGS: Gallbladder: Distended containing multiple gallstones. There is trace pericholecystic fluid. Positive sonographic Murphy sign noted by the sonographer. No definite gallbladder wall thickening. Common bile duct: Diameter: Dilated measuring 8 mm. Probable echogenic stone in the distal common bile duct measuring 9 mm. Liver: There is intrahepatic biliary ductal dilatation. No focal lesion identified. Within normal limits in parenchymal echogenicity. Portal vein is patent on color Doppler imaging with normal direction of blood flow towards the liver. Other: None. IMPRESSION: 1. Gallstones with distended gallbladder, trace pericholecystic fluid, and positive sonographic Murphy sign, suspicious for acute cholecystitis. 2. Intra and extrahepatic biliary ductal dilatation. Probable 9 mm stone in the distal common bile duct. Electronically Signed   By: MKeith RakeM.D.   On: 05/06/2021 23:33        Scheduled Meds:  amLODipine  10 mg Oral Daily   atorvastatin  10 mg Oral Daily  citalopram  20 mg Oral Daily   insulin aspart  0-9 Units Subcutaneous TID AC & HS   irbesartan  300 mg Oral Daily   metoprolol succinate  100 mg Oral Daily   [START ON 05/09/2021] pantoprazole  40 mg Oral Q0600   Continuous Infusions:  cefTRIAXone (ROCEPHIN)  IV Stopped (05/07/21 2127)   dextrose 5% lactated ringers 75 mL/hr at 05/07/21 2231   metronidazole 500 mg (05/08/21 1330)     LOS: 1 day        Hosie Poisson, MD [facility]   To contact the attending provider between 7A-7P or the covering provider during after hours 7P-7A, please log into the web site [email or web address] and access using universal [place] password for that web site. If you do not have the password, please call the hospital  operator.  05/08/2021, 1:50 PM

## 2021-05-08 NOTE — Consult Note (Addendum)
Cardiology Consultation:   Patient ID: Kara Dean MRN: [id number]; DOB: [birth year]  Admit date: 05/06/2021 Date of Consult: 05/08/2021  PCP:  [name], [facility] Providers Cardiologist:  Sherren Mocha, MD  Cardiology APP:  Liliane Shi, PA-C       Patient Profile:   Kara Dean is a 87 y.o. female with a hx of HTN, HLD, HFpEF, IDDM, GERD, TIA, bilateral carotid artery stenosis and Crohn's disease (s/p ex lap and bowel resection 1990s) who is being seen 05/08/2021 for preoperative evaluation at the request of Dr. Karleen Hampshire.  History of Present Illness:   Kara Dean is a 87 year old lady who presented yesterday for evaluation of epigastric pain that was described as severe and dull.  She reported associated nausea and vomiting of nonbilious, nonbloody emesis.  Patient reports similar episodes in the past year with this episode has been worse.  Patient denied any fevers, sick contacts, diarrhea, constipation, cough, shortness of breath, chest pain, dizziness, palpitations or syncope.  She reported having a headache that has now resolved. She also reports that her abdominal pain has improved the nausea and vomiting has resolved.  On admission, patient was found to have mild leukocytosis to 12.5, elevated transaminases but remained afebrile.  CT abdomen/pelvis showed cholelithiasis and distended gallbladder with evidence of common bile duct stone and subsequent RUQ U/S showed gallstones w/ distended gallbladder, pericholecystic fluid and positive sonographic Murphy sign. Patient was admitted to the hospitalist service and GI was consulted.  MRCP confirmed cholelithiasis with suspicion for acute cholecystitis but no biliary ductal dilation or choledocholithiasis.  Symptoms have improved, AKI resolved and LFTs trending down. Cardiology was consulted for cardiac clearance for possible laparoscopic cholecystectomy with surgery.    Past Medical History:  Diagnosis Date    Anxiety    Carotid artery disease ([place])    Carotid US 11/2018:  bilateral ICA 1-39 >> repeat 2 years.    Cataract    Crohn's colitis ([place])    CVA (cerebral infarction)    No deficits   Depression    Diabetes mellitus    type 2   Dyslipidemia    GERD (gastroesophageal reflux disease)    History of TIAs    no deficits   Hyperlipidemia    Hypertension    Hyponatremia    Nodular basal cell carcinoma (BCC) 04/22/2018   left prox, nasal root (tx p bx)   Osteoarthritis    SCC (squamous cell carcinoma) 04/22/2018   left shin,superior-(tx p bx), Left shin,inferior-(tx p bx )    Past Surgical History:  Procedure Laterality Date   ABDOMINAL HYSTERECTOMY     BACK SURGERY  1994   CATARACT EXTRACTION W/PHACO Left 09/19/2017   Procedure: CATARACT EXTRACTION PHACO AND INTRAOCULAR LENS PLACEMENT ([facility]);  Surgeon: Baruch Goldmann, MD;  Location: [facility];  Service: Ophthalmology;  Laterality: Left;  CDE: 8.10   CATARACT EXTRACTION W/PHACO Right 10/10/2017   Procedure: CATARACT EXTRACTION PHACO AND INTRAOCULAR LENS PLACEMENT RIGHT EYE;  Surgeon: Baruch Goldmann, MD;  Location: [facility];  Service: Ophthalmology;  Laterality: Right;  CDE: 9.60   COLON RESECTION  1990's   TUBAL LIGATION     VESICOVAGINAL FISTULA CLOSURE W/ TAH  1984     Home Medications:  Prior to Admission medications   Medication Sig Start Date End Date Taking? Authorizing Provider  amLODipine (NORVASC) 10 MG tablet Take 1 tablet (10 mg total) by mouth daily. 05/01/21  Yes Hassell Done, Mary-Margaret, FNP  atorvastatin (LIPITOR) 10 MG  tablet Take 1 tablet (10 mg total) by mouth daily. (NEEDS TO BE SEEN BEFORE NEXT REFILL) 05/01/21  Yes Hassell Done, Mary-Margaret, FNP  B Complex Vitamins (B COMPLEX-B12) TABS Take 1 tablet by mouth daily.   Yes [provider]  Cholecalciferol (VITAMIN D3 PO) Take 1 capsule daily by mouth.   Yes [provider]  citalopram (CELEXA) 20 MG tablet Take 1 tablet (20 mg total) by mouth daily. (NEEDS  TO BE SEEN BEFORE NEXT REFILL) 05/01/21  Yes Hassell Done, Mary-Margaret, FNP  dipyridamole-aspirin (AGGRENOX) 200-25 MG 12hr capsule TAKE  (1)  CAPSULE  TWICE DAILY. Patient taking differently: Take 1 capsule by mouth 2 (two) times daily. 05/01/21  Yes Martin, Mary-Margaret, FNP  fluticasone (FLONASE) 50 MCG/ACT nasal spray USE 2 SPRAYS IN EACH NOSTRIL ONCE DAILY. Patient taking differently: Place 2 sprays into both nostrils as needed for allergies or rhinitis. 10/13/20  Yes Martin, Mary-Margaret, FNP  furosemide (LASIX) 20 MG tablet Take 1 tablet (20 mg total) by mouth as directed. Mon, Wed, and Fri only. Patient taking differently: Take 20 mg by mouth 3 (three) times a week. Mon, Wed, and Fri only. 05/01/21  Yes Martin, Mary-Margaret, FNP  glucose blood (ONETOUCH ULTRA) test strip Check BS up to 3 times daily Dx E11.9 Patient taking differently: 1 each by Other route in the morning, at noon, and at bedtime. 10/13/20  Yes Martin, Mary-Margaret, FNP  LANTUS SOLOSTAR 100 UNIT/ML Solostar Pen Inject 40 Units into the skin daily. 02/26/21  Yes [provider]  LORazepam (ATIVAN) 0.5 MG tablet Take 1 tablet (0.5 mg total) by mouth 2 (two) times daily. 05/01/21  Yes Martin, Mary-Margaret, FNP  Magnesium 250 MG TABS Take 250 mg by mouth daily.   Yes [provider]  metoprolol succinate (TOPROL-XL) 100 MG 24 hr tablet TAKE 1 TABLET ONCE DAILY WITH A MEAL Patient taking differently: Take 100 mg by mouth daily. 05/01/21  Yes Martin, Mary-Margaret, FNP  olmesartan (BENICAR) 40 MG tablet Take 1 tablet (40 mg total) by mouth daily. 05/01/21  Yes Hassell Done, Mary-Margaret, FNP  Omega-3 Fatty Acids (FISH OIL) 1000 MG CAPS Take 1 capsule by mouth daily.   Yes [provider]  pantoprazole (PROTONIX) 40 MG tablet Take 1 tablet (40 mg total) by mouth daily. (NEEDS TO BE SEEN BEFORE NEXT REFILL) 05/01/21  Yes Hassell Done, Mary-Margaret, FNP  potassium chloride (KLOR-CON) 10 MEQ tablet Take 1 tablet (10 mEq  total) by mouth as directed. Mon, Wed, and Fri only. 05/01/21  Yes Hassell Done, Mary-Margaret, FNP    Inpatient Medications: Scheduled Meds:  amLODipine  10 mg Oral Daily   atorvastatin  10 mg Oral Daily   citalopram  20 mg Oral Daily   insulin aspart  0-9 Units Subcutaneous TID AC & HS   irbesartan  300 mg Oral Daily   metoprolol succinate  100 mg Oral Daily   [START ON 05/09/2021] pantoprazole  40 mg Oral Q0600   Continuous Infusions:  cefTRIAXone (ROCEPHIN)  IV Stopped (05/07/21 2127)   dextrose 5% lactated ringers 75 mL/hr at 05/07/21 2231   metronidazole 500 mg (05/08/21 1330)   PRN Meds: acetaminophen **OR** acetaminophen, fentaNYL (SUBLIMAZE) injection **OR** fentaNYL (SUBLIMAZE) injection, hydrALAZINE, LORazepam, ondansetron (ZOFRAN) IV, polyethylene glycol  Allergies:    Allergies  Allergen Reactions   Codeine Other (See Comments)    unknown   Latex Rash   Morphine Other (See Comments)    unknown   Niacin Other (See Comments)    unknown   Sulfa Antibiotics  Rash   Sulfonamide Derivatives Rash    Social History:   Social History   Socioeconomic History   Marital status: Widowed    Spouse name: Not on file   Number of children: 6   Years of education: 10   Highest education level: 10th grade  Occupational History   Occupation: Retired    Comment: [employer]  Tobacco Use   Smoking status: Never   Smokeless tobacco: Never  Vaping Use   Vaping Use: Never used  Substance and Sexual Activity   Alcohol use: No   Drug use: No   Sexual activity: Not Currently  Other Topics Concern   Not on file  Social History Narrative   Mrs Stille is retired and widowed. She lives at home and her granddaughter lives with her. She lives within walking distance to her 2 daughter's houses. Has an indoor cat. Her sister moved to [place] and she doesn't get to get to visit with her often but does talk with her on the phone. She has 6 children and many grandchildren and great grandchildren.     Social Determinants of Health   Financial Resource Strain: Low Risk    Difficulty of Paying Living Expenses: Not hard at all  Food Insecurity: No Food Insecurity   Worried About Charity fundraiser in the Last Year: Never true   Arboriculturist in the Last Year: Never true  Transportation Needs: Unmet Transportation Needs   Lack of Transportation (Medical): Yes   Lack of Transportation (Non-Medical): No  Physical Activity: Inactive   Days of Exercise per Week: 0 days   Minutes of Exercise per Session: 0 min  Stress: Stress Concern Present   Feeling of Stress : Rather much  Social Connections: Socially Isolated   Frequency of Communication with Friends and Family: More than three times a week   Frequency of Social Gatherings with Friends and Family: Once a week   Attends Religious Services: Never   Marine scientist or Organizations: No   Attends Archivist Meetings: Never   Marital Status: Widowed  Human resources officer Violence: Not At Risk   Fear of Current or Ex-Partner: No   Emotionally Abused: No   Physically Abused: No   Sexually Abused: No    Family History:   Family History  Problem Relation Age of Onset   Coronary artery disease Sister        stent x 3   Seizures Sister    Coronary artery disease Brother        stent   Hypertension Mother    Stroke Mother    Emphysema Father    Alcohol abuse Brother    Lung disease Brother    Heart attack Daughter      ROS:  Please see the history of present illness.  All other ROS reviewed and negative.     Physical Exam/Data:   Vitals:   05/07/21 2255 05/08/21 0400 05/08/21 0854 05/08/21 1143  BP: (!) 125/50 (!) 162/45 (!) 174/48 (!) 148/43  Pulse: (!) 57 60  60  Resp: 15 18  18   Temp: 98.5 F (36.9 C) 98.7 F (37.1 C)  99.2 F (37.3 C)  TempSrc: Oral Oral  Oral  SpO2: 95% 91%  91%  Weight:      Height:        Intake/Output Summary (Last 24 hours) at 05/08/2021 1332 Last data filed at 05/08/2021  1003 Gross per 24 hour  Intake 200 ml  Output 800 ml  Net -600 ml   Last 3 Weights 05/06/2021 05/01/2021 04/03/2021  Weight (lbs) 124 lb 124 lb 124 lb 12.8 oz  Weight (kg) 56.246 kg 56.246 kg 56.609 kg     Body mass index is 24.22 kg/m.  General:  Well nourished, well developed elderly woman laying in bed.  No acute distress.  HEENT: Dry mucous membrane Lymph: no adenopathy Neck: no JVD Endocrine:  No thryomegaly Vascular: No carotid bruits; FA pulses 2+ bilaterally without bruits  Cardiac:  normal S1, S2; RRR; no murmur.  No LE edema Lungs:  clear to auscultation bilaterally, no wheezing, rhonchi or rales  Abd: soft, nondistened. Mild TTP of RUQ. No hepatomegaly. Normal BS.  Ext: no edema Musculoskeletal:  No deformities, BUE and BLE strength normal and equal Skin: warm and dry. Decreased skin turgor. Age-related skin changes. Neuro:  CNs 2-12 intact, no focal abnormalities noted Psych:  Normal affect   EKG:  The EKG was personally reviewed and demonstrates: NSR with LVH and incomplete LBBB Telemetry:  Telemetry was personally reviewed and demonstrates: Normal sinus rhythm  Relevant CV Studies: Vas U/S carotid 11/2018: Mild stenosis with non-hemodynamically significant plaque Echo 04/2020: EF 60-65%, mild LVH, G2DD, severely elevated PASP.   Laboratory Data:  High Sensitivity Troponin:   Recent Labs  Lab 05/06/21 2033 05/06/21 2233  TROPONINIHS 9 8     Chemistry Recent Labs  Lab 05/06/21 2033 05/07/21 0345 05/08/21 1032  NA 139 140 140  K 4.2 4.9 3.6  CL 106 104 108  CO2 25 25 25   GLUCOSE 157* 201* 109*  BUN 18 15 12   CREATININE 1.20* 1.14* 0.94  CALCIUM 9.6 9.8 9.1  GFRNONAA 44* 47* 59*  ANIONGAP 8 11 7     Recent Labs  Lab 05/06/21 2033 05/07/21 0345 05/08/21 1032  PROT 6.8 7.0 6.1*  ALBUMIN 3.6 3.4* 3.0*  AST 114* 161* 52*  ALT 52* 98* 55*  ALKPHOS 76 94 73  BILITOT 1.6* 1.2 0.7   Hematology Recent Labs  Lab 05/06/21 2033 05/07/21 0345  WBC  12.5* 9.8  RBC 3.70* 4.07  HGB 11.4* 12.5  HCT 35.4* 38.8  MCV 95.7 95.3  MCH 30.8 30.7  MCHC 32.2 32.2  RDW 12.6 12.5  PLT 192 195   BNPNo results for input(s): BNP, PROBNP in the last 168 hours.  DDimer No results for input(s): DDIMER in the last 168 hours.   Radiology/Studies:  CT ABDOMEN PELVIS W CONTRAST  Result Date: 05/06/2021 CLINICAL DATA:  Epigastric pain, nausea, and vomiting. History of Crohn's colitis. EXAM: CT ABDOMEN AND PELVIS WITH CONTRAST TECHNIQUE: Multidetector CT imaging of the abdomen and pelvis was performed using the standard protocol following bolus administration of intravenous contrast. CONTRAST:  63m OMNIPAQUE IOHEXOL 300 MG/ML  SOLN COMPARISON:  None. FINDINGS: Lower chest: Coarse interstitial scarring in the lung bases. Coronary artery calcifications. Small esophageal hiatal hernia. Hepatobiliary: Cholelithiasis with distended gallbladder. Moderate intrahepatic and extrahepatic bile duct dilatation. A tiny calcification is suggested in the distal common bile duct suggesting a common duct stone. Pancreas: Unremarkable. No pancreatic ductal dilatation or surrounding inflammatory changes. Spleen: Normal in size without focal abnormality. Adrenals/Urinary Tract: No adrenal gland nodules. Small parenchymal cysts in both kidneys. Scarring in the upper pole of the right kidney. Nephrograms are otherwise symmetrical and homogeneous. No hydronephrosis or hydroureter. Bladder is unremarkable. Stomach/Bowel: Stomach, small bowel, and colon are not abnormally distended. Mild wall thickening in the rectosigmoid and descending colon likely related to the patient's  history of colitis. No pericolonic inflammatory changes or abscess is identified. Appendix is not visualized. Vascular/Lymphatic: Diffuse calcification of the abdominal aorta and celiac/mesenteric branch vessels. No aneurysm. Reproductive: Prostate is unremarkable. Other: No free air or free fluid in the abdomen. Abdominal  wall musculature appears intact. Musculoskeletal: Degenerative changes in the lumbar spine. No destructive bone lesions. IMPRESSION: 1. Cholelithiasis and distended gallbladder. Bile duct dilatation with probable common bile duct stone. 2. Coarse interstitial fibrosis in the lung bases. 3. Small esophageal hiatal hernia. 4. Aortic atherosclerosis. 5. Mild diffuse wall thickening of the left colon consistent with patient's history of colitis. Electronically Signed   By: Lucienne Capers M.D.   On: 05/06/2021 22:24   MR ABDOMEN MRCP W WO CONTAST  Result Date: 05/07/2021 CLINICAL DATA:  Cholelithiasis. Biliary ductal dilatation on recent CT. EXAM: MRI ABDOMEN WITHOUT AND WITH CONTRAST (INCLUDING MRCP) TECHNIQUE: Multiplanar multisequence MR imaging of the abdomen was performed both before and after the administration of intravenous contrast. Heavily T2-weighted images of the biliary and pancreatic ducts were obtained, and three-dimensional MRCP images were rendered by post processing. CONTRAST:  5.10m GADAVIST GADOBUTROL 1 MMOL/ML IV SOLN COMPARISON:  CT on 05/06/2021 FINDINGS: Lower chest: No acute findings. Hepatobiliary: No hepatic masses identified. Tiny sub-cm cyst noted in posterior right hepatic lobe. Numerous tiny gallstones are seen. The gallbladder is distended, and there is mild diffuse gallbladder wall thickening and small amount of pericholecystic fluid. These findings are suspicious for acute cholecystitis. No evidence of biliary ductal dilatation, with common bile duct measuring 5 mm. No evidence of choledocholithiasis or biliary stricture. Mild perihepatic ascites noted. Pancreas: A simple appearing cyst is seen in the pancreatic uncinate process measuring 1.2 x 1.0 cm on image 26/5. No evidence of pancreatic ductal dilatation. Spleen:  Within normal limits in size and appearance. Adrenals/Urinary Tract: 1.9 cm subcapsular cyst is noted in the upper pole of the left kidney. Mild right renal  parenchymal scarring again seen. Stomach/Bowel: Visualized portion unremarkable. Vascular/Lymphatic: No pathologically enlarged lymph nodes identified. No acute vascular findings. Other:  None. Musculoskeletal:  No suspicious bone lesions identified. IMPRESSION: Cholelithiasis, with findings suspicious for acute cholecystitis. No evidence of biliary ductal dilatation or choledocholithiasis. Mild perihepatic ascites. 1.2 cm simple appearing cyst in the pancreatic uncinate process, likely representing an indolent cystic pancreatic neoplasm such as a side-branch IPMN. In patient of this age, continued follow-up by MRI is recommended in 2 years. This recommendation follows ACR consensus guidelines: Management of Incidental Pancreatic Cysts: A White Paper of the ACR Incidental Findings Committee. [id number] Electronically Signed   By: JMarlaine HindM.D.   On: 05/07/2021 16:00   UKoreaAbdomen Limited RUQ (LIVER/GB)  Result Date: 05/06/2021 CLINICAL DATA:  Right upper quadrant pain. EXAM: ULTRASOUND ABDOMEN LIMITED RIGHT UPPER QUADRANT COMPARISON:  Abdominal CT earlier today. FINDINGS: Gallbladder: Distended containing multiple gallstones. There is trace pericholecystic fluid. Positive sonographic Murphy sign noted by the sonographer. No definite gallbladder wall thickening. Common bile duct: Diameter: Dilated measuring 8 mm. Probable echogenic stone in the distal common bile duct measuring 9 mm. Liver: There is intrahepatic biliary ductal dilatation. No focal lesion identified. Within normal limits in parenchymal echogenicity. Portal vein is patent on color Doppler imaging with normal direction of blood flow towards the liver. Other: None. IMPRESSION: 1. Gallstones with distended gallbladder, trace pericholecystic fluid, and positive sonographic Murphy sign, suspicious for acute cholecystitis. 2. Intra and extrahepatic biliary ductal dilatation. Probable 9 mm stone in the distal common bile  duct.  Electronically Signed   By: Keith Rake M.D.   On: 05/06/2021 23:33     Assessment and Plan:   Ms. Karg is a 87 year old lady with hx of HTN, HLD, HFpEF, IDDM, GERD, TIA, bilateral carotid artery stenosis and Crohn's disease (s/p ex lap and bowel resection 1990s) who is being seen 05/08/2021 for preoperative evaluation for possible laparoscopic cholecystectomy.  Preoperative cardiac evaluation for possible laparoscopic cholecystectomy:  Echo on 04/2020 showed EF 60-65%, mild LVH, G2DD, severely elevated PASP. Patient with compensated heart failure with no significant HF symptoms. Not overly fluid overloaded on exam. History of TIA but carotic ultrasound shows less than 40% bilateral ICA stenosis. Denies any chest pain. No ST changes on EKG which is unchanged from prior EKGs. Patient with appropriate O2 sats on room air. Patient denies any PND, orthopnea, CP at rest and she is able to walk a block without getting out of breath but not 2 blocks. Pt unsure if she can walk up the stairs. Per daughter, patient ambulates w/o assistance device and walks around the house without any problems. Due to patient's age and multiple risk factors, recommending stress test before surgery.         --ACS NSQIP shows a 5.2% risk of serious complication, 1.7% risk any complications and and 4.0% risk of cardiac complication.        --Myoview stress test tomorrow morning to further evaluate for CAD        --NPO at midnight Hypertension: Continue antihypertensives CKD 3A : Kidney function improved with creatinine of 0.94 and BUN of 12. Type 2 diabetes: Per primary team, will need to optimize blood sugar for optimal wound healing after surgery.   Risk Assessment/Risk Scores:        New York Heart Association (NYHA) Functional Class NYHA Class II    For questions or updates, please contact [facility] Please consult [email or web address] for contact info under    Signed, Lacinda Axon, MD  05/08/2021 1:32 PM    Patient seen and examined and agree with Linwood Dibbles, MD as detailed above.  In brief, the patient is a 87 year old female with history of HTN, HLD, HFpEF, IDDM, GERD, TIA, bilateral carotid artery stenosis and Crohn's disease (s/p ex lap and bowel resection 1990s) who presented with epigastric pain, nausea and vomiting found to have common bile duct stone with cholecystitis. Cardiology consulted for pre-operative evaluation prior to cholecystectomy.   Patient states that she follows closely with Dr. Burt Knack. Has history of HFpEF and mild carotid disease. No known history of  CAD. She notes occasional chest pain and SOB on exercise limiting exertion. Last myoview in 2014 normal. TTE 04/2020 with LVEF 60-65%, no WMA, mild MR, mild to moderate AI. Due to inability to perform >4METs and some exertional symptoms, will check myoview to rule out high risk features. If no high risk features present, okay to proceed with scheduled surgery without further CV work-up. Notably, if procedure is emergent, do not need stress testing prior to OR and can proceed directly with surgery.   GEN: Elderly female, NAD  Neck: No JVD Cardiac: RRR, 2/6 systolic murmur Respiratory: Clear to auscultation bilaterally. GI: Soft, mildly TTP in RUQ MS: No edema; No deformity. Neuro:  Nonfocal  Psych: Normal affect    Plan: -Plan for myoview due to limited exercise capacity and mild exertional symptoms -If no high risk features on myoview, no further work-up needed from CV perspective prior to procedure -Notably, if procedure  is emergent, can proceed directly to OR without above work-up -Continue home antihypertensives -Management of cholecystitis per surgery, GI and primary teams  Gwyndolyn Kaufman, MD

## 2021-05-08 NOTE — Progress Notes (Addendum)
Daily Rounding Note  05/08/2021, 9:26 AM  LOS: 1 day   SUBJECTIVE:   Chief complaint:   Choledocholithiasis? Abdominal pain resolved, nausea resolved.  Hungry and has not been receiving any PO of significance for 3 days.  No bowel movement since arrival.  Overall feeling better  OBJECTIVE:         Vital signs in last 24 hours:    Temp:  [98.2 F (36.8 C)-98.7 F (37.1 C)] 98.7 F (37.1 C) (07/05 0400) Pulse Rate:  [45-61] 60 (07/05 0400) Resp:  [15-19] 18 (07/05 0400) BP: (125-174)/(41-73) 174/48 (07/05 0854) SpO2:  [91 %-98 %] 91 % (07/05 0400)   Filed Weights   05/06/21 2028  Weight: 56.2 kg   General: Looks well.  Comfortable.  Alert Heart: RRR. Chest: Clear bilaterally without labored breathing or cough Abdomen: Soft.  NT.  Nondistended.  Bowel sounds active. Extremities: No CCE. Neuro/Psych: Pleasant, calm, cooperative.  Hard of hearing.  No tremors or gross weakness/deficits.  Intake/Output from previous day: 07/04 0701 - 07/05 0700 In: 200 [IV Piggyback:200] Out: 250 [Urine:250]  Intake/Output this shift: No intake/output data recorded.  Lab Results: Recent Labs    05/06/21 2033 05/07/21 0345  WBC 12.5* 9.8  HGB 11.4* 12.5  HCT 35.4* 38.8  PLT 192 195   BMET Recent Labs    05/06/21 2033 05/07/21 0345  NA 139 140  K 4.2 4.9  CL 106 104  CO2 25 25  GLUCOSE 157* 201*  BUN 18 15  CREATININE 1.20* 1.14*  CALCIUM 9.6 9.8   LFT Recent Labs    05/06/21 2033 05/07/21 0345  PROT 6.8 7.0  ALBUMIN 3.6 3.4*  AST 114* 161*  ALT 52* 98*  ALKPHOS 76 94  BILITOT 1.6* 1.2   PT/INR Recent Labs    05/06/21 2033 05/07/21 0345  LABPROT 14.5 14.4  INR 1.1 1.1   Hepatitis Panel No results for input(s): HEPBSAG, HCVAB, HEPAIGM, HEPBIGM in the last 72 hours.  Studies/Results: CT ABDOMEN PELVIS W CONTRAST  Result Date: 05/06/2021 CLINICAL DATA:  Epigastric pain, nausea, and vomiting.  History of Crohn's colitis. EXAM: CT ABDOMEN AND PELVIS WITH CONTRAST TECHNIQUE: Multidetector CT imaging of the abdomen and pelvis was performed using the standard protocol following bolus administration of intravenous contrast. CONTRAST:  74m OMNIPAQUE IOHEXOL 300 MG/ML  SOLN COMPARISON:  None. FINDINGS: Lower chest: Coarse interstitial scarring in the lung bases. Coronary artery calcifications. Small esophageal hiatal hernia. Hepatobiliary: Cholelithiasis with distended gallbladder. Moderate intrahepatic and extrahepatic bile duct dilatation. A tiny calcification is suggested in the distal common bile duct suggesting a common duct stone. Pancreas: Unremarkable. No pancreatic ductal dilatation or surrounding inflammatory changes. Spleen: Normal in size without focal abnormality. Adrenals/Urinary Tract: No adrenal gland nodules. Small parenchymal cysts in both kidneys. Scarring in the upper pole of the right kidney. Nephrograms are otherwise symmetrical and homogeneous. No hydronephrosis or hydroureter. Bladder is unremarkable. Stomach/Bowel: Stomach, small bowel, and colon are not abnormally distended. Mild wall thickening in the rectosigmoid and descending colon likely related to the patient's history of colitis. No pericolonic inflammatory changes or abscess is identified. Appendix is not visualized. Vascular/Lymphatic: Diffuse calcification of the abdominal aorta and celiac/mesenteric branch vessels. No aneurysm. Reproductive: Prostate is unremarkable. Other: No free air or free fluid in the abdomen. Abdominal wall musculature appears intact. Musculoskeletal: Degenerative changes in the lumbar spine. No destructive bone lesions. IMPRESSION: 1. Cholelithiasis and distended gallbladder. Bile duct dilatation with probable  common bile duct stone. 2. Coarse interstitial fibrosis in the lung bases. 3. Small esophageal hiatal hernia. 4. Aortic atherosclerosis. 5. Mild diffuse wall thickening of the left colon  consistent with patient's history of colitis. Electronically Signed   By: Lucienne Capers M.D.   On: 05/06/2021 22:24   MR ABDOMEN MRCP W WO CONTAST  Result Date: 05/07/2021 CLINICAL DATA:  Cholelithiasis. Biliary ductal dilatation on recent CT. EXAM: MRI ABDOMEN WITHOUT AND WITH CONTRAST (INCLUDING MRCP) TECHNIQUE: Multiplanar multisequence MR imaging of the abdomen was performed both before and after the administration of intravenous contrast. Heavily T2-weighted images of the biliary and pancreatic ducts were obtained, and three-dimensional MRCP images were rendered by post processing. CONTRAST:  5.51m GADAVIST GADOBUTROL 1 MMOL/ML IV SOLN COMPARISON:  CT on 05/06/2021 FINDINGS: Lower chest: No acute findings. Hepatobiliary: No hepatic masses identified. Tiny sub-cm cyst noted in posterior right hepatic lobe. Numerous tiny gallstones are seen. The gallbladder is distended, and there is mild diffuse gallbladder wall thickening and small amount of pericholecystic fluid. These findings are suspicious for acute cholecystitis. No evidence of biliary ductal dilatation, with common bile duct measuring 5 mm. No evidence of choledocholithiasis or biliary stricture. Mild perihepatic ascites noted. Pancreas: A simple appearing cyst is seen in the pancreatic uncinate process measuring 1.2 x 1.0 cm on image 26/5. No evidence of pancreatic ductal dilatation. Spleen:  Within normal limits in size and appearance. Adrenals/Urinary Tract: 1.9 cm subcapsular cyst is noted in the upper pole of the left kidney. Mild right renal parenchymal scarring again seen. Stomach/Bowel: Visualized portion unremarkable. Vascular/Lymphatic: No pathologically enlarged lymph nodes identified. No acute vascular findings. Other:  None. Musculoskeletal:  No suspicious bone lesions identified. IMPRESSION: Cholelithiasis, with findings suspicious for acute cholecystitis. No evidence of biliary ductal dilatation or choledocholithiasis. Mild  perihepatic ascites. 1.2 cm simple appearing cyst in the pancreatic uncinate process, likely representing an indolent cystic pancreatic neoplasm such as a side-branch IPMN. In patient of this age, continued follow-up by MRI is recommended in 2 years. This recommendation follows ACR consensus guidelines: Management of Incidental Pancreatic Cysts: A White Paper of the ACR Incidental Findings Committee. [id number];68:[id number] Electronically Signed   By: JMarlaine HindM.D.   On: 05/07/2021 16:00   [place]Abdomen Limited RUQ (LIVER/GB)  Result Date: 05/06/2021 CLINICAL DATA:  Right upper quadrant pain. EXAM: ULTRASOUND ABDOMEN LIMITED RIGHT UPPER QUADRANT COMPARISON:  Abdominal CT earlier today. FINDINGS: Gallbladder: Distended containing multiple gallstones. There is trace pericholecystic fluid. Positive sonographic Murphy sign noted by the sonographer. No definite gallbladder wall thickening. Common bile duct: Diameter: Dilated measuring 8 mm. Probable echogenic stone in the distal common bile duct measuring 9 mm. Liver: There is intrahepatic biliary ductal dilatation. No focal lesion identified. Within normal limits in parenchymal echogenicity. Portal vein is patent on color Doppler imaging with normal direction of blood flow towards the liver. Other: None. IMPRESSION: 1. Gallstones with distended gallbladder, trace pericholecystic fluid, and positive sonographic Murphy sign, suspicious for acute cholecystitis. 2. Intra and extrahepatic biliary ductal dilatation. Probable 9 mm stone in the distal common bile duct. Electronically Signed   By: MKeith RakeM.D.   On: 05/06/2021 23:33    Scheduled Meds:  amLODipine  10 mg Oral Daily   atorvastatin  10 mg Oral Daily   citalopram  20 mg Oral Daily   insulin aspart  0-9 Units Subcutaneous TID AC & HS   irbesartan  300 mg Oral Daily   metoprolol succinate  100  mg Oral Daily   pantoprazole (PROTONIX) IV  40 mg Intravenous Daily   Continuous  Infusions:  cefTRIAXone (ROCEPHIN)  IV Stopped (05/07/21 2127)   dextrose 5% lactated ringers 75 mL/hr at 05/07/21 2231   metronidazole 500 mg (05/08/21 0530)   PRN Meds:.acetaminophen **OR** acetaminophen, fentaNYL (SUBLIMAZE) injection **OR** fentaNYL (SUBLIMAZE) injection, hydrALAZINE, LORazepam, ondansetron (ZOFRAN) IV, polyethylene glycol   ASSESMENT:   Choledocholithiasis.  Epig pain, nausea vomiting.  Intermittent occurrences, but did not seek medical eval, over last 12 months.  Elevated LFTs.   CTAP w contrast: Cholelithiasis, GB distention.  Moderate intra-/extrahepatic ductal dilatation.  Tiny calcification at distal CBD S/O stone, rectosigmoid and descending colon thickening consistent with hx colitis..  Abdominal ultrasound with GB stones, suspicion for acute cholecystitis, intra and extrahepatic biliary ductal dilatation, probable 9 mm stone CBD stone.  MRCP also confirms cholelithiasis and suspicion cholecystitis.  However no biliary ductal dilatation or choledocholithiasis.  There is 1.2 cm uncomplicated cyst at pancreatic uncinate may represent indolent cystic pancreatic neoplasm such as sidebranch IPMN for which consensus is MRI follow-up in 2 years.  LFTs are climbing.     AKI, better.   Leukocytosis.  Resolved.  WBCs 12.5 >> 9.8. Reported hx Crohn's dz (no details in record).  Colon resection (no details in record) 1990s.   IDDM    PLAN      Laparoscopic cholecystectomy with IOC pending cardiac/medical clearance.  ? Pre-op ERCP? Previous abdominal pelvic surgeries including LOA put her at risk for conversion to open cholecystectomy.  Repeat c-Met pending for tomorrow. Given rising LFTs, should we hold Lipitor temporarily?  Azucena Freed  05/08/2021, 9:26 AM Phone [phone]   GI ATTENDING  History, laboratories, x-rays reviewed.  Case discussed with Dr. Silverio Decamp.  MRCP does NOT show choledocholithiasis.  Normal bile duct diameter.  Normal alkaline phosphatase and  bilirubin.  Agree with plans for laparoscopic cholecystectomy with IOC.  We are available if needed.  Docia Chuck. Geri Seminole., M.D. [facility] Division of Gastroenterology

## 2021-05-09 ENCOUNTER — Inpatient Hospital Stay (HOSPITAL_COMMUNITY): Payer: Medicare Other

## 2021-05-09 DIAGNOSIS — K8042 Calculus of bile duct with acute cholecystitis without obstruction: Secondary | ICD-10-CM | POA: Diagnosis not present

## 2021-05-09 DIAGNOSIS — Z01810 Encounter for preprocedural cardiovascular examination: Secondary | ICD-10-CM

## 2021-05-09 LAB — NM MYOCAR MULTI W/SPECT W/WALL MOTION / EF
Estimated workload: 1 METS
Exercise duration (min): 10 min
Exercise duration (sec): 39 s
MPHR: 135 {beats}/min
Peak HR: 90 {beats}/min
Percent HR: 66 %
Rest HR: 54 {beats}/min

## 2021-05-09 LAB — GLUCOSE, CAPILLARY
Glucose-Capillary: 173 mg/dL — ABNORMAL HIGH (ref 70–99)
Glucose-Capillary: 148 mg/dL — ABNORMAL HIGH (ref 70–99)
Glucose-Capillary: 185 mg/dL — ABNORMAL HIGH (ref 70–99)
Glucose-Capillary: 150 mg/dL — ABNORMAL HIGH (ref 70–99)

## 2021-05-09 MED ORDER — ONDANSETRON HCL 4 MG/2ML IJ SOLN
INTRAMUSCULAR | Status: AC
  Administered 2021-05-10: 4 mg via INTRAVENOUS
  Filled 2021-05-09: qty 2

## 2021-05-09 MED ORDER — REGADENOSON 0.4 MG/5ML IV SOLN
0.4000 mg | Freq: Once | INTRAVENOUS | Status: AC
  Filled 2021-05-09: qty 5

## 2021-05-09 MED ORDER — TECHNETIUM TC 99M TETROFOSMIN IV KIT
33.0000 | PACK | Freq: Once | INTRAVENOUS | Status: AC | PRN
  Administered 2021-05-09: 33 via INTRAVENOUS

## 2021-05-09 MED ORDER — REGADENOSON 0.4 MG/5ML IV SOLN
INTRAVENOUS | Status: AC
  Administered 2021-05-09: 0.4 mg via INTRAVENOUS
  Filled 2021-05-09: qty 5

## 2021-05-09 MED ORDER — TECHNETIUM TC 99M TETROFOSMIN IV KIT
10.0000 | PACK | Freq: Once | INTRAVENOUS | Status: AC | PRN
  Administered 2021-05-09: 10 via INTRAVENOUS

## 2021-05-09 NOTE — Progress Notes (Signed)
Cardiology recs noted. Patient at "acceptable risk for the planned procedure without further cardiovascular testing". I met with the patient and her 2 daughters. I have explained the indications, procedure, risks, and aftercare of Laparoscopic cholecystectomy.  Risks include but are not limited to anesthesia (MI, CVA, death), bleeding, infection, wound problems, hernia, bile leak, injury to common bile duct/liver/intestine and diarrhea post op. We also discussed the option of nonoperative treatment which carries risks of ongoing symptoms that she is having, worsening cholecystitis, recurrent choledocholithiasis etc. Family would like to discuss overnight before deciding whether to proceed with surgery. Will make NPO at midnight and check in AM.   Alferd Apa, [facility] Surgery 5:24 PM, 05/09/2021

## 2021-05-09 NOTE — Progress Notes (Signed)
   Lisseth Moreen Fowler presented for a nuclear stress test today.  No immediate complications.  Stress imaging is pending at this time.  Preliminary EKG findings may be listed in the chart, but the stress test result will not be finalized until perfusion imaging is complete.  1 day study, [place] to read.  Rosaria Ferries, PA-C 05/09/2021, 10:18 AM

## 2021-05-09 NOTE — Progress Notes (Signed)
Subjective/Chief Complaint: Again states that she is feeling better.   Objective: Vital signs in last 24 hours: Temp:  [98.5 F (36.9 C)-99 F (37.2 C)] 98.5 F (36.9 C) (07/06 1150) Pulse Rate:  [52-86] 60 (07/06 1150) Resp:  [16-18] 16 (07/06 1150) BP: (146-190)/(40-68) 155/57 (07/06 1150) SpO2:  [92 %-95 %] 95 % (07/06 1150)    Intake/Output from previous day: 07/05 0701 - 07/06 0700 In: 1462.9 [I.V.:1062.9; IV Piggyback:400] Out: 1150 [Urine:1150] Intake/Output this shift: No intake/output data recorded.  Alert, cooperative Unlabored respirations Abdomen soft, minimal TTP RUQ. Midline incision from xyphoid to pubis well healed  Lab Results:  Recent Labs    05/06/21 2033 05/07/21 0345  WBC 12.5* 9.8  HGB 11.4* 12.5  HCT 35.4* 38.8  PLT 192 195    BMET Recent Labs    05/07/21 0345 05/08/21 1032  NA 140 140  K 4.9 3.6  CL 104 108  CO2 25 25  GLUCOSE 201* 109*  BUN 15 12  CREATININE 1.14* 0.94  CALCIUM 9.8 9.1    PT/INR Recent Labs    05/06/21 2033 05/07/21 0345  LABPROT 14.5 14.4  INR 1.1 1.1    ABG No results for input(s): PHART, HCO3 in the last 72 hours.  Invalid input(s): PCO2, PO2  Studies/Results: MR ABDOMEN MRCP W WO CONTAST  Result Date: 05/07/2021 CLINICAL DATA:  Cholelithiasis. Biliary ductal dilatation on recent CT. EXAM: MRI ABDOMEN WITHOUT AND WITH CONTRAST (INCLUDING MRCP) TECHNIQUE: Multiplanar multisequence MR imaging of the abdomen was performed both before and after the administration of intravenous contrast. Heavily T2-weighted images of the biliary and pancreatic ducts were obtained, and three-dimensional MRCP images were rendered by post processing. CONTRAST:  5.52m GADAVIST GADOBUTROL 1 MMOL/ML IV SOLN COMPARISON:  CT on 05/06/2021 FINDINGS: Lower chest: No acute findings. Hepatobiliary: No hepatic masses identified. Tiny sub-cm cyst noted in posterior right hepatic lobe. Numerous tiny gallstones are seen. The  gallbladder is distended, and there is mild diffuse gallbladder wall thickening and small amount of pericholecystic fluid. These findings are suspicious for acute cholecystitis. No evidence of biliary ductal dilatation, with common bile duct measuring 5 mm. No evidence of choledocholithiasis or biliary stricture. Mild perihepatic ascites noted. Pancreas: A simple appearing cyst is seen in the pancreatic uncinate process measuring 1.2 x 1.0 cm on image 26/5. No evidence of pancreatic ductal dilatation. Spleen:  Within normal limits in size and appearance. Adrenals/Urinary Tract: 1.9 cm subcapsular cyst is noted in the upper pole of the left kidney. Mild right renal parenchymal scarring again seen. Stomach/Bowel: Visualized portion unremarkable. Vascular/Lymphatic: No pathologically enlarged lymph nodes identified. No acute vascular findings. Other:  None. Musculoskeletal:  No suspicious bone lesions identified. IMPRESSION: Cholelithiasis, with findings suspicious for acute cholecystitis. No evidence of biliary ductal dilatation or choledocholithiasis. Mild perihepatic ascites. 1.2 cm simple appearing cyst in the pancreatic uncinate process, likely representing an indolent cystic pancreatic neoplasm such as a side-branch IPMN. In patient of this age, continued follow-up by MRI is recommended in 2 years. This recommendation follows ACR consensus guidelines: Management of Incidental Pancreatic Cysts: A White Paper of the ACR Incidental Findings Committee. [id number];93:[id number] Electronically Signed   By: JMarlaine HindM.D.   On: 05/07/2021 16:00    Anti-infectives: Anti-infectives (From admission, onward)    Start     Dose/Rate Route Frequency Ordered Stop   05/07/21 2215  cefTRIAXone (ROCEPHIN) 2 g in sodium chloride 0.9 % 100 mL IVPB  See Hyperspace for full Linked Orders Report.   2 g 200 mL/hr over 30 Minutes Intravenous Every 24 hours 05/07/21 0158     05/07/21 0600  metroNIDAZOLE (FLAGYL)  IVPB 500 mg        500 mg 100 mL/hr over 60 Minutes Intravenous Every 8 hours 05/07/21 0158     05/07/21 0215  cefTRIAXone (ROCEPHIN) 1 g in sodium chloride 0.9 % 100 mL IVPB       See Hyperspace for full Linked Orders Report.   1 g 200 mL/hr over 30 Minutes Intravenous  Once 05/07/21 0158 05/07/21 0304   05/06/21 2330  cefTRIAXone (ROCEPHIN) 1 g in sodium chloride 0.9 % 100 mL IVPB        1 g 200 mL/hr over 30 Minutes Intravenous  Once 05/06/21 2319 05/07/21 0025   05/06/21 2330  metroNIDAZOLE (FLAGYL) IVPB 500 mg        500 mg 100 mL/hr over 60 Minutes Intravenous  Once 05/06/21 2319 05/07/21 0145       Assessment/Plan: Kara Dean is an 87 y.o. female with HTN, HLD, T2DM, PAD, prior TIA, Crohns (s/p exlap and 'bowel resection' 1990s, no treatment since) - here with choledocholithiasis   -Story and clinical picture may support this originating from her gallbladder. She has a normal wbc at this point, no pain on exam and has a normal bilirubin but with possible ductal dilation on her CT/US and additionally a potential common duct stone.  MRCP was negative for choledocholithiasis; suspicious for cholecystitis, noted mild perihepatic ascites.. -Cardiac clearance pending (Myoview scheduled for today). With her complex medical and surgical history including a large laparotomy adhesive disease could place her at increased risks of bowel injury or even open surgery. -I I discussed with the patient and her daughter who is at the bedside the technical aspects, relevant anatomy and technique of laparoscopic cholecystectomy.  Discussed risks of surgery including bleeding, pain, scarring, intraabdominal injury specifically to the common bile duct and sequelae, bile leak, conversion to open surgery, blood clot, pneumonia, heart attack, stroke, failure to resolve symptoms, etc. we also discussed the option of nonoperative treatment which carries risks of ongoing symptoms that she is having, worsening  cholecystitis, recurrent choledocholithiasis etc.  Questions welcomed and answered.  We will continue the conversation once cleared from a cardiac standpoint as to whether the patient wishes to pursue surgical intervention.   LOS: 2 days    Clovis Riley 05/09/2021

## 2021-05-09 NOTE — Progress Notes (Signed)
PROGRESS NOTE    Kara Dean  HQP:[id number] DOB: [birth year] DOA: 05/06/2021 PCP: [name], FNP    No chief complaint on file.   Brief Narrative:  87 year old female with past medical history of hyperlipidemia, insulin-dependent diabetes mellitus type 2, hypertension, gastroesophageal reflux disease, previous TIA, bilateral carotid artery stenosis as well as reported remote history of Crohn's diagnosed over 30 years ago not on treatment who presents to [facility] emergency department with complaints of abdominal pain nausea and vomiting.  -Work-up in the ED was notable for leukocytosis, CT abdomen was concerning for CBD stone ultrasound noted distended gallbladder with pericholecystic fluid -General surgery and GI were consulted along with cardiology for preop clearance -MRCP was negative for choledocholithiasis -Cardiology recommended Myoview  Assessment & Plan:   Choledocholithiasis with acute cholecystitis MRCP showed no CBD stones, signs of acute cholecystitis present -Continue IV ceftriaxone, Flagyl -LFTs improving, suspect passed stone -Cardiology consulted for preop clearance, Myoview today -General surgery following, pt and family concerned about risks of surgical complications -labs in am  Essential hypertension -Blood pressure poorly controlled -Continue amlodipine, Avapro, hydralazine as needed  Type 2 diabetes mellitus, insulin-dependent with stage IIIa CKD. -Continue SSI, CBGS stable  GERD without esophagitis Continue PPI  Stage IIIa CKD Creatinine better than baseline  DVT prophylaxis: SCD'S Code Status: Full code Family Communication: daughter at bedside Disposition:   Status is: Inpatient  Remains inpatient appropriate because:Ongoing diagnostic testing needed not appropriate for outpatient work up  Dispo: The patient is from: Home              Anticipated d/c is to:  pending.               Patient currently is not medically  stable to d/c.   Difficult to place patient No       Consultants:  General surgery GI.  Cardiology.   Procedures: MRCP ERCP  Antimicrobials:  Antibiotics Given (last 72 hours)     Date/Time Action Medication Dose Rate   05/06/21 2355 New Bag/Given   cefTRIAXone (ROCEPHIN) 1 g in sodium chloride 0.9 % 100 mL IVPB 1 g 200 mL/hr   05/06/21 2359 New Bag/Given   metroNIDAZOLE (FLAGYL) IVPB 500 mg 500 mg 100 mL/hr   05/07/21 0223 New Bag/Given   cefTRIAXone (ROCEPHIN) 1 g in sodium chloride 0.9 % 100 mL IVPB 1 g 200 mL/hr   05/07/21 0545 New Bag/Given   metroNIDAZOLE (FLAGYL) IVPB 500 mg 500 mg 100 mL/hr   05/07/21 1603 New Bag/Given   metroNIDAZOLE (FLAGYL) IVPB 500 mg 500 mg 100 mL/hr   05/07/21 2053 New Bag/Given   cefTRIAXone (ROCEPHIN) 2 g in sodium chloride 0.9 % 100 mL IVPB 2 g 200 mL/hr   05/07/21 2119 New Bag/Given   metroNIDAZOLE (FLAGYL) IVPB 500 mg 500 mg 100 mL/hr   05/08/21 0530 New Bag/Given   metroNIDAZOLE (FLAGYL) IVPB 500 mg 500 mg 100 mL/hr   05/08/21 1330 New Bag/Given   metroNIDAZOLE (FLAGYL) IVPB 500 mg 500 mg 100 mL/hr   05/08/21 2145 New Bag/Given   cefTRIAXone (ROCEPHIN) 2 g in sodium chloride 0.9 % 100 mL IVPB 2 g 200 mL/hr   05/08/21 2313 New Bag/Given   metroNIDAZOLE (FLAGYL) IVPB 500 mg 500 mg 100 mL/hr   05/09/21 0651 New Bag/Given   metroNIDAZOLE (FLAGYL) IVPB 500 mg 500 mg 100 mL/hr   05/09/21 1428 New Bag/Given   metroNIDAZOLE (FLAGYL) IVPB 500 mg 500 mg 100 mL/hr  Subjective: NO nausea, vomiting or abdominal pain. Wants to go home and eat.   Objective: Vitals:   05/09/21 0936 05/09/21 1008 05/09/21 1013 05/09/21 1150  BP: (!) 183/62 (!) 190/68 (!) 183/67 (!) 155/57  Pulse: (!) 52 86 86 60  Resp:    16  Temp:    98.5 F (36.9 C)  TempSrc:    Oral  SpO2:    95%  Weight:      Height:        Intake/Output Summary (Last 24 hours) at 05/09/2021 1436 Last data filed at 05/09/2021 0510 Gross per 24 hour  Intake 1462.85 ml   Output 600 ml  Net 862.85 ml   Filed Weights   05/06/21 2028  Weight: 56.2 kg    Examination: Gen: AAOx3 Respiratory system: Clear to auscultation. Respiratory effort normal. Cardiovascular system: S1 & S2 heard, RRR. No JVD, No pedal edema. Gastrointestinal system: Abdomen is nondistended, soft and nontender. Normal bowel sounds heard. Central nervous system: Alert and oriented. No focal neurological deficits. Extremities: Symmetric 5 x 5 power. Skin: No rashes, lesions or ulcers Psychiatry: Mood & affect appropriate.     Data Reviewed: I have personally reviewed following labs and imaging studies  CBC: Recent Labs  Lab 05/06/21 2033 05/07/21 0345  WBC 12.5* 9.8  NEUTROABS 11.1* 8.5*  HGB 11.4* 12.5  HCT 35.4* 38.8  MCV 95.7 95.3  PLT 192 914    Basic Metabolic Panel: Recent Labs  Lab 05/06/21 2033 05/07/21 0345 05/08/21 1032  NA 139 140 140  K 4.2 4.9 3.6  CL 106 104 108  CO2 25 25 25   GLUCOSE 157* 201* 109*  BUN 18 15 12   CREATININE 1.20* 1.14* 0.94  CALCIUM 9.6 9.8 9.1  MG  --  2.1  --     GFR: Estimated Creatinine Clearance: 34.4 mL/min (by C-G formula based on SCr of 0.94 mg/dL).  Liver Function Tests: Recent Labs  Lab 05/06/21 2033 05/07/21 0345 05/08/21 1032  AST 114* 161* 52*  ALT 52* 98* 55*  ALKPHOS 76 94 73  BILITOT 1.6* 1.2 0.7  PROT 6.8 7.0 6.1*  ALBUMIN 3.6 3.4* 3.0*    CBG: Recent Labs  Lab 05/08/21 1109 05/08/21 1606 05/08/21 2105 05/09/21 0558 05/09/21 1112  GLUCAP 117* 146* 203* 173* 148*     Recent Results (from the past 240 hour(s))  Resp Panel by RT-PCR (Flu A&B, Covid) Nasopharyngeal Swab     Status: None   Collection Time: 05/06/21  9:24 PM   Specimen: Nasopharyngeal Swab; Nasopharyngeal(NP) swabs in vial transport medium  Result Value Ref Range Status   SARS Coronavirus 2 by RT PCR NEGATIVE NEGATIVE Final    Comment: (NOTE) SARS-CoV-2 target nucleic acids are NOT DETECTED.  The SARS-CoV-2 RNA is  generally detectable in upper respiratory specimens during the acute phase of infection. The lowest concentration of SARS-CoV-2 viral copies this assay can detect is 138 copies/mL. A negative result does not preclude SARS-Cov-2 infection and should not be used as the sole basis for treatment or other patient management decisions. A negative result may occur with  improper specimen collection/handling, submission of specimen other than nasopharyngeal swab, presence of viral mutation(s) within the areas targeted by this assay, and inadequate number of viral copies(<138 copies/mL). A negative result must be combined with clinical observations, patient history, and epidemiological information. The expected result is Negative.  Fact Sheet for Patients:  EntrepreneurPulse.com.au  Fact Sheet for Healthcare Providers:  IncredibleEmployment.be  This test is no  t yet approved or cleared by the [place] and  has been authorized for detection and/or diagnosis of SARS-CoV-2 by FDA under an Emergency Use Authorization (EUA). This EUA will remain  in effect (meaning this test can be used) for the duration of the COVID-19 declaration under Section 564(b)(1) of the Act, 21 U.S.C.section 360bbb-3(b)(1), unless the authorization is terminated  or revoked sooner.       Influenza A by PCR NEGATIVE NEGATIVE Final   Influenza B by PCR NEGATIVE NEGATIVE Final    Comment: (NOTE) The Xpert Xpress SARS-CoV-2/FLU/RSV plus assay is intended as an aid in the diagnosis of influenza from Nasopharyngeal swab specimens and should not be used as a sole basis for treatment. Nasal washings and aspirates are unacceptable for Xpert Xpress SARS-CoV-2/FLU/RSV testing.  Fact Sheet for Patients: EntrepreneurPulse.com.au  Fact Sheet for Healthcare Providers: IncredibleEmployment.be  This test is not yet approved or cleared by the [place] FDA and has been authorized for detection and/or diagnosis of SARS-CoV-2 by FDA under an Emergency Use Authorization (EUA). This EUA will remain in effect (meaning this test can be used) for the duration of the COVID-19 declaration under Section 564(b)(1) of the Act, 21 U.S.C. section 360bbb-3(b)(1), unless the authorization is terminated or revoked.  Performed at [facility], [place] [address] [place], [place] [address]      Radiology Studies: MR ABDOMEN MRCP W WO CONTAST  Result Date: 05/07/2021 CLINICAL DATA:  Cholelithiasis. Biliary ductal dilatation on recent CT. EXAM: MRI ABDOMEN WITHOUT AND WITH CONTRAST (INCLUDING MRCP) TECHNIQUE: Multiplanar multisequence MR imaging of the abdomen was performed both before and after the administration of intravenous contrast. Heavily T2-weighted images of the biliary and pancreatic ducts were obtained, and three-dimensional MRCP images were rendered by post processing. CONTRAST:  5.9m GADAVIST GADOBUTROL 1 MMOL/ML IV SOLN COMPARISON:  CT on 05/06/2021 FINDINGS: Lower chest: No acute findings. Hepatobiliary: No hepatic masses identified. Tiny sub-cm cyst noted in posterior right hepatic lobe. Numerous tiny gallstones are seen. The gallbladder is distended, and there is mild diffuse gallbladder wall thickening and small amount of pericholecystic fluid. These findings are suspicious for acute cholecystitis. No evidence of biliary ductal dilatation, with common bile duct measuring 5 mm. No evidence of choledocholithiasis or biliary stricture. Mild perihepatic ascites noted. Pancreas: A simple appearing cyst is seen in the pancreatic uncinate process measuring 1.2 x 1.0 cm on image 26/5. No evidence of pancreatic ductal dilatation. Spleen:  Within normal limits in size and appearance. Adrenals/Urinary Tract: 1.9 cm subcapsular cyst is noted in the upper pole of the left kidney. Mild right renal parenchymal scarring again seen. Stomach/Bowel:  Visualized portion unremarkable. Vascular/Lymphatic: No pathologically enlarged lymph nodes identified. No acute vascular findings. Other:  None. Musculoskeletal:  No suspicious bone lesions identified. IMPRESSION: Cholelithiasis, with findings suspicious for acute cholecystitis. No evidence of biliary ductal dilatation or choledocholithiasis. Mild perihepatic ascites. 1.2 cm simple appearing cyst in the pancreatic uncinate process, likely representing an indolent cystic pancreatic neoplasm such as a side-branch IPMN. In patient of this age, continued follow-up by MRI is recommended in 2 years. This recommendation follows ACR consensus guidelines: Management of Incidental Pancreatic Cysts: A White Paper of the ACR Incidental Findings Committee. [id number];[id number] Electronically Signed   By: JMarlaine HindM.D.   On: 05/07/2021 16:00     Scheduled Meds:  amLODipine  10 mg Oral Daily   citalopram  20 mg Oral Daily   insulin aspart  0-9  Units Subcutaneous TID AC & HS   irbesartan  300 mg Oral Daily   metoprolol succinate  100 mg Oral Daily   pantoprazole  40 mg Oral Q0600   Continuous Infusions:  cefTRIAXone (ROCEPHIN)  IV 2 g (05/08/21 2145)   metronidazole 500 mg (05/09/21 1428)     LOS: 2 days    Domenic Polite, MD [facility]  05/09/2021, 2:36 PM

## 2021-05-09 NOTE — Progress Notes (Signed)
Stress test:  IMPRESSION: 1. No reversible ischemia or infarction.   2. Normal left ventricular wall motion.   3. Left ventricular ejection fraction 63%   4. Non invasive risk stratification*: Low  Updated result to daughter at beside and Dr. Broadus John.   Given past medical history and time since last visit, based on ACC/AHA guidelines, Kara Dean would be at acceptable risk for the planned procedure without further cardiovascular testing.   [place], [place] 05/09/2021, 4:19 PM

## 2021-05-09 NOTE — Progress Notes (Addendum)
Progress Note  Patient Name: Kara Dean Date of Encounter: 05/09/2021  [facility] Cardiologist: Sherren Mocha, MD   Subjective   Belly is a little better, no CP or SOB  Inpatient Medications    Scheduled Meds:  amLODipine  10 mg Oral Daily   citalopram  20 mg Oral Daily   insulin aspart  0-9 Units Subcutaneous TID AC & HS   irbesartan  300 mg Oral Daily   metoprolol succinate  100 mg Oral Daily   pantoprazole  40 mg Oral Q0600   Continuous Infusions:  cefTRIAXone (ROCEPHIN)  IV 2 g (05/08/21 2145)   metronidazole 500 mg (05/09/21 0651)   PRN Meds: acetaminophen **OR** acetaminophen, fentaNYL (SUBLIMAZE) injection **OR** fentaNYL (SUBLIMAZE) injection, hydrALAZINE, LORazepam, ondansetron (ZOFRAN) IV, polyethylene glycol   Vital Signs    Vitals:   05/08/21 1143 05/08/21 1647 05/08/21 2253 05/09/21 0449  BP: (!) 148/43 (!) 150/44 (!) 146/40 (!) 164/51  Pulse: 60 (!) 55 61 (!) 57  Resp: 18 18 18 16   Temp: 99.2 F (37.3 C) 99 F (37.2 C) 98.9 F (37.2 C) 98.8 F (37.1 C)  TempSrc: Oral Oral Oral Oral  SpO2: 91% 92% 93% 94%  Weight:      Height:        Intake/Output Summary (Last 24 hours) at 05/09/2021 0839 Last data filed at 05/09/2021 0510 Gross per 24 hour  Intake 1462.85 ml  Output 1150 ml  Net 312.85 ml   Last 3 Weights 05/06/2021 05/01/2021 04/03/2021  Weight (lbs) 124 lb 124 lb 124 lb 12.8 oz  Weight (kg) 56.246 kg 56.246 kg 56.609 kg      Telemetry    SR - Personally Reviewed  ECG    None today - Personally Reviewed  Physical Exam   GEN: No acute distress.   Neck: No JVD Cardiac: RRR, no murmurs, rubs, or gallops.  Respiratory: Clear to auscultation bilaterally. GI: Soft, tender, non-distended, + MS: No edema; No deformity. Neuro:  Nonfocal  Psych: Normal affect   Labs    High Sensitivity Troponin:   Recent Labs  Lab 05/06/21 2033 05/06/21 2233  TROPONINIHS 9 8      Chemistry Recent Labs  Lab 05/06/21 2033 05/07/21 0345  05/08/21 1032  NA 139 140 140  K 4.2 4.9 3.6  CL 106 104 108  CO2 25 25 25   GLUCOSE 157* 201* 109*  BUN 18 15 12   CREATININE 1.20* 1.14* 0.94  CALCIUM 9.6 9.8 9.1  PROT 6.8 7.0 6.1*  ALBUMIN 3.6 3.4* 3.0*  AST 114* 161* 52*  ALT 52* 98* 55*  ALKPHOS 76 94 73  BILITOT 1.6* 1.2 0.7  GFRNONAA 44* 47* 59*  ANIONGAP 8 11 7      Hematology Recent Labs  Lab 05/06/21 2033 05/07/21 0345  WBC 12.5* 9.8  RBC 3.70* 4.07  HGB 11.4* 12.5  HCT 35.4* 38.8  MCV 95.7 95.3  MCH 30.8 30.7  MCHC 32.2 32.2  RDW 12.6 12.5  PLT 192 195   Lab Results  Component Value Date   TSH 1.540 07/26/2015   Lab Results  Component Value Date   CHOL 109 05/01/2021   HDL 42 05/01/2021   LDLCALC 40 05/01/2021   TRIG 161 (H) 05/01/2021   CHOLHDL 2.6 05/01/2021   Lab Results  Component Value Date   HGBA1C 7.1 (H) 05/07/2021    BNPNo results for input(s): BNP, PROBNP in the last 168 hours.   DDimer No results for input(s): DDIMER in  the last 168 hours.   Radiology    MR ABDOMEN MRCP W WO CONTAST  Result Date: 05/07/2021 CLINICAL DATA:  Cholelithiasis. Biliary ductal dilatation on recent CT. EXAM: MRI ABDOMEN WITHOUT AND WITH CONTRAST (INCLUDING MRCP) TECHNIQUE: Multiplanar multisequence MR imaging of the abdomen was performed both before and after the administration of intravenous contrast. Heavily T2-weighted images of the biliary and pancreatic ducts were obtained, and three-dimensional MRCP images were rendered by post processing. CONTRAST:  5.35m GADAVIST GADOBUTROL 1 MMOL/ML IV SOLN COMPARISON:  CT on 05/06/2021 FINDINGS: Lower chest: No acute findings. Hepatobiliary: No hepatic masses identified. Tiny sub-cm cyst noted in posterior right hepatic lobe. Numerous tiny gallstones are seen. The gallbladder is distended, and there is mild diffuse gallbladder wall thickening and small amount of pericholecystic fluid. These findings are suspicious for acute cholecystitis. No evidence of biliary ductal  dilatation, with common bile duct measuring 5 mm. No evidence of choledocholithiasis or biliary stricture. Mild perihepatic ascites noted. Pancreas: A simple appearing cyst is seen in the pancreatic uncinate process measuring 1.2 x 1.0 cm on image 26/5. No evidence of pancreatic ductal dilatation. Spleen:  Within normal limits in size and appearance. Adrenals/Urinary Tract: 1.9 cm subcapsular cyst is noted in the upper pole of the left kidney. Mild right renal parenchymal scarring again seen. Stomach/Bowel: Visualized portion unremarkable. Vascular/Lymphatic: No pathologically enlarged lymph nodes identified. No acute vascular findings. Other:  None. Musculoskeletal:  No suspicious bone lesions identified. IMPRESSION: Cholelithiasis, with findings suspicious for acute cholecystitis. No evidence of biliary ductal dilatation or choledocholithiasis. Mild perihepatic ascites. 1.2 cm simple appearing cyst in the pancreatic uncinate process, likely representing an indolent cystic pancreatic neoplasm such as a side-branch IPMN. In patient of this age, continued follow-up by MRI is recommended in 2 years. This recommendation follows ACR consensus guidelines: Management of Incidental Pancreatic Cysts: A White Paper of the ACR Incidental Findings Committee. [id number];80:[id number] Electronically Signed   By: JMarlaine HindM.D.   On: 05/07/2021 16:00    Cardiac Studies   MV pending  Patient Profile     87y.o. female with a hx of HTN, HLD, HFpEF, IDDM, GERD, TIA, bilateral carotid artery stenosis and Crohn's disease (s/p ex lap and bowel resection 1990s), who was admitted 07/04 with abd pain, N&V, gallstone cholecystitis w/ possible CBD stone. Cards seeing for clearance for ERCP and possible lap choley.  Assessment & Plan    Preop cardiovascular exam - ez neg MI - no ongoing ischemic sx - for MV today  2. ?chloledocholelithiasis - GI and Surgery seeing - BS present - see MRI report, no CBD stone  seen, but has cholecystitis       For questions or updates, please contact CAlliancePlease consult [email or web address] for contact info under        Signed, RRosaria Ferries PA-C  05/09/2021, 8:39 AM    Patient seen and examined and agree with RRosaria Ferries PA-C as detailed above.  In brief, the patient is a 87year old female with history of HTN, HLD, HFpEF, IDDM, GERD, TIA, bilateral carotid artery stenosis and Crohn's disease (s/p ex lap and bowel resection 1990s) who presented with epigastric pain, nausea and vomiting found to have common bile duct stone with cholecystitis. Cardiology consulted for pre-operative evaluation prior to cholecystectomy.    Patient has known history of HFpEF and mild carotid disease. No known history of CAD. Reports occasional chest pain and SOB on exercise limiting exertion. Last myoview in 2014  normal. TTE 04/2020 with LVEF 60-65%, no WMA, mild MR, mild to moderate AI. Due to inability to perform >4METs and some exertional symptoms, myoview performed prior to surgery (currently pending). If no high risk features present, okay to proceed with scheduled surgery without further CV work-up.    GEN: Elderly female, NAD  Neck: No JVD Cardiac: RRR, 2/6 systolic murmur Respiratory: Clear to auscultation bilaterally. GI: Soft, mildly TTP in RUQ MS: No edema; No deformity. Neuro:  Nonfocal Psych: Normal affect     Plan: -Follow-up myoview -If no high risk features on myoview, no further work-up needed from CV perspective prior to procedure -Continue home antihypertensives -Management of cholecystitis per surgery, GI and primary teams  Gwyndolyn Kaufman, MD

## 2021-05-10 ENCOUNTER — Inpatient Hospital Stay (HOSPITAL_COMMUNITY): Payer: Medicare Other | Admitting: Certified Registered Nurse Anesthetist

## 2021-05-10 ENCOUNTER — Inpatient Hospital Stay (HOSPITAL_COMMUNITY): Payer: Medicare Other

## 2021-05-10 ENCOUNTER — Encounter (HOSPITAL_COMMUNITY): Payer: Self-pay | Admitting: Internal Medicine

## 2021-05-10 ENCOUNTER — Encounter (HOSPITAL_COMMUNITY)
Admission: EM | Disposition: A | Discharge status: Home Health | Payer: Self-pay | Source: Home / Self Care | Attending: Internal Medicine

## 2021-05-10 DIAGNOSIS — K8042 Calculus of bile duct with acute cholecystitis without obstruction: Secondary | ICD-10-CM | POA: Diagnosis not present

## 2021-05-10 HISTORY — PX: CHOLECYSTECTOMY: SHX55

## 2021-05-10 LAB — CBC
HCT: 35.1 % — ABNORMAL LOW (ref 36.0–46.0)
Hemoglobin: 11.5 g/dL — ABNORMAL LOW (ref 12.0–15.0)
MCH: 30.4 pg (ref 26.0–34.0)
MCHC: 32.8 g/dL (ref 30.0–36.0)
MCV: 92.9 fL (ref 80.0–100.0)
Platelets: 185 10*3/uL (ref 150–400)
RBC: 3.78 MIL/uL — ABNORMAL LOW (ref 3.87–5.11)
RDW: 12.5 % (ref 11.5–15.5)
WBC: 8.6 10*3/uL (ref 4.0–10.5)
nRBC: 0 % (ref 0.0–0.2)

## 2021-05-10 LAB — COMPREHENSIVE METABOLIC PANEL
ALT: 34 U/L (ref 0–44)
AST: 31 U/L (ref 15–41)
Albumin: 2.9 g/dL — ABNORMAL LOW (ref 3.5–5.0)
Alkaline Phosphatase: 63 U/L (ref 38–126)
Anion gap: 7 (ref 5–15)
BUN: 11 mg/dL (ref 8–23)
CO2: 23 mmol/L (ref 22–32)
Calcium: 9 mg/dL (ref 8.9–10.3)
Chloride: 108 mmol/L (ref 98–111)
Creatinine, Ser: 1.06 mg/dL — ABNORMAL HIGH (ref 0.44–1.00)
GFR, Estimated: 51 mL/min — ABNORMAL LOW (ref >60)
Glucose, Bld: 138 mg/dL — ABNORMAL HIGH (ref 70–99)
Potassium: 3.5 mmol/L (ref 3.5–5.1)
Sodium: 138 mmol/L (ref 135–145)
Total Bilirubin: 0.9 mg/dL (ref 0.3–1.2)
Total Protein: 6.1 g/dL — ABNORMAL LOW (ref 6.5–8.1)

## 2021-05-10 LAB — GLUCOSE, CAPILLARY
Glucose-Capillary: 160 mg/dL — ABNORMAL HIGH (ref 70–99)
Glucose-Capillary: 120 mg/dL — ABNORMAL HIGH (ref 70–99)
Glucose-Capillary: 138 mg/dL — ABNORMAL HIGH (ref 70–99)
Glucose-Capillary: 162 mg/dL — ABNORMAL HIGH (ref 70–99)
Glucose-Capillary: 178 mg/dL — ABNORMAL HIGH (ref 70–99)
Glucose-Capillary: 200 mg/dL — ABNORMAL HIGH (ref 70–99)

## 2021-05-10 SURGERY — LAPAROSCOPIC CHOLECYSTECTOMY WITH INTRAOPERATIVE CHOLANGIOGRAM
Anesthesia: General

## 2021-05-10 MED ORDER — GLYCOPYRROLATE PF 0.2 MG/ML IJ SOSY
PREFILLED_SYRINGE | INTRAMUSCULAR | Status: DC | PRN
  Administered 2021-05-10: .2 mg via INTRAVENOUS

## 2021-05-10 MED ORDER — CHLORHEXIDINE GLUCONATE 0.12 % MT SOLN: 15.0000 mL | Freq: Once | OROMUCOSAL | Status: AC

## 2021-05-10 MED ORDER — EPHEDRINE 5 MG/ML INJ
INTRAVENOUS | Status: AC
  Filled 2021-05-10: qty 10

## 2021-05-10 MED ORDER — PHENYLEPHRINE 40 MCG/ML (10ML) SYRINGE FOR IV PUSH (FOR BLOOD PRESSURE SUPPORT)
PREFILLED_SYRINGE | INTRAVENOUS | Status: DC | PRN
  Administered 2021-05-10: 80 ug via INTRAVENOUS

## 2021-05-10 MED ORDER — DEXAMETHASONE SODIUM PHOSPHATE 10 MG/ML IJ SOLN
INTRAMUSCULAR | Status: AC
  Filled 2021-05-10: qty 1

## 2021-05-10 MED ORDER — LACTATED RINGERS IV SOLN: INTRAVENOUS | Status: DC

## 2021-05-10 MED ORDER — SUGAMMADEX SODIUM 200 MG/2ML IV SOLN
INTRAVENOUS | Status: DC | PRN
  Administered 2021-05-10: 200 mg via INTRAVENOUS

## 2021-05-10 MED ORDER — CHLORHEXIDINE GLUCONATE 0.12 % MT SOLN
OROMUCOSAL | Status: AC
  Administered 2021-05-10: 15 mL via OROMUCOSAL
  Filled 2021-05-10: qty 15

## 2021-05-10 MED ORDER — ROCURONIUM BROMIDE 10 MG/ML (PF) SYRINGE
PREFILLED_SYRINGE | INTRAVENOUS | Status: DC | PRN
  Administered 2021-05-10: 70 mg via INTRAVENOUS

## 2021-05-10 MED ORDER — ACETAMINOPHEN 500 MG PO TABS
ORAL_TABLET | ORAL | Status: AC
  Administered 2021-05-10: 1000 mg via ORAL
  Filled 2021-05-10: qty 2

## 2021-05-10 MED ORDER — ONDANSETRON HCL 4 MG/2ML IJ SOLN
INTRAMUSCULAR | Status: DC | PRN
  Administered 2021-05-10: 4 mg via INTRAVENOUS

## 2021-05-10 MED ORDER — ROCURONIUM BROMIDE 10 MG/ML (PF) SYRINGE
PREFILLED_SYRINGE | INTRAVENOUS | Status: AC
  Filled 2021-05-10: qty 10

## 2021-05-10 MED ORDER — DEXAMETHASONE SODIUM PHOSPHATE 10 MG/ML IJ SOLN
INTRAMUSCULAR | Status: DC | PRN
  Administered 2021-05-10: 5 mg via INTRAVENOUS

## 2021-05-10 MED ORDER — GLYCOPYRROLATE PF 0.2 MG/ML IJ SOSY
PREFILLED_SYRINGE | INTRAMUSCULAR | Status: AC
  Filled 2021-05-10: qty 1

## 2021-05-10 MED ORDER — PROPOFOL 10 MG/ML IV BOLUS
INTRAVENOUS | Status: DC | PRN
  Administered 2021-05-10: 80 mg via INTRAVENOUS

## 2021-05-10 MED ORDER — PROPOFOL 10 MG/ML IV BOLUS
INTRAVENOUS | Status: AC
  Filled 2021-05-10: qty 20

## 2021-05-10 MED ORDER — ONDANSETRON HCL 4 MG/2ML IJ SOLN
INTRAMUSCULAR | Status: AC
  Filled 2021-05-10: qty 2

## 2021-05-10 MED ORDER — FENTANYL CITRATE (PF) 250 MCG/5ML IJ SOLN
INTRAMUSCULAR | Status: DC | PRN
  Administered 2021-05-10: 100 ug via INTRAVENOUS

## 2021-05-10 MED ORDER — BUPIVACAINE-EPINEPHRINE 0.25% -1:200000 IJ SOLN
INTRAMUSCULAR | Status: DC | PRN
  Administered 2021-05-10: 13 mL

## 2021-05-10 MED ORDER — BUPIVACAINE-EPINEPHRINE (PF) 0.25% -1:200000 IJ SOLN
INTRAMUSCULAR | Status: AC
  Filled 2021-05-10: qty 30

## 2021-05-10 MED ORDER — ORAL CARE MOUTH RINSE: 15.0000 mL | Freq: Once | OROMUCOSAL | Status: AC

## 2021-05-10 MED ORDER — OXYCODONE HCL 5 MG PO TABS: 2.5000 mg | ORAL_TABLET | ORAL | Status: DC | PRN

## 2021-05-10 MED ORDER — SODIUM CHLORIDE 0.9 % IR SOLN
Status: DC | PRN
  Administered 2021-05-10: 1000 mL

## 2021-05-10 MED ORDER — EPHEDRINE SULFATE-NACL 50-0.9 MG/10ML-% IV SOSY
PREFILLED_SYRINGE | INTRAVENOUS | Status: DC | PRN
  Administered 2021-05-10: 5 mg via INTRAVENOUS

## 2021-05-10 MED ORDER — ACETAMINOPHEN 500 MG PO TABS: 1000.0000 mg | ORAL_TABLET | Freq: Once | ORAL | Status: AC

## 2021-05-10 MED ORDER — FENTANYL CITRATE (PF) 250 MCG/5ML IJ SOLN
INTRAMUSCULAR | Status: AC
  Filled 2021-05-10: qty 5

## 2021-05-10 MED ORDER — LIDOCAINE 2% (20 MG/ML) 5 ML SYRINGE
INTRAMUSCULAR | Status: DC | PRN
  Administered 2021-05-10: 100 mg via INTRAVENOUS

## 2021-05-10 MED ORDER — LIDOCAINE 2% (20 MG/ML) 5 ML SYRINGE
INTRAMUSCULAR | Status: AC
  Filled 2021-05-10: qty 5

## 2021-05-10 MED ORDER — 0.9 % SODIUM CHLORIDE (POUR BTL) OPTIME
TOPICAL | Status: DC | PRN
  Administered 2021-05-10: 1000 mL

## 2021-05-10 SURGICAL SUPPLY — 41 items
APPLIER CLIP 5 13 M/L LIGAMAX5 (MISCELLANEOUS) ×2
BAG COUNTER SPONGE SURGICOUNT (BAG) ×2 IMPLANT
CANISTER SUCT 3000ML PPV (MISCELLANEOUS) ×2 IMPLANT
CHLORAPREP W/TINT 26 (MISCELLANEOUS) ×2 IMPLANT
CLIP APPLIE 5 13 M/L LIGAMAX5 (MISCELLANEOUS) ×1 IMPLANT
CNTNR URN SCR LID CUP LEK RST (MISCELLANEOUS) ×1 IMPLANT
CONT SPEC 4OZ STRL OR WHT (MISCELLANEOUS) ×2
COVER MAYO STAND STRL (DRAPES) IMPLANT
COVER SURGICAL LIGHT HANDLE (MISCELLANEOUS) ×2 IMPLANT
DERMABOND ADVANCED (GAUZE/BANDAGES/DRESSINGS) ×1
DERMABOND ADVANCED .7 DNX12 (GAUZE/BANDAGES/DRESSINGS) ×1 IMPLANT
DRAPE C-ARM 42X120 X-RAY (DRAPES) IMPLANT
ELECT REM PT RETURN 9FT ADLT (ELECTROSURGICAL) ×2
ELECTRODE REM PT RTRN 9FT ADLT (ELECTROSURGICAL) ×1 IMPLANT
GAUZE 4X4 16PLY ~~LOC~~+RFID DBL (SPONGE) ×2 IMPLANT
GLOVE SURG ENC MOIS LTX SZ6 (GLOVE) ×2 IMPLANT
GLOVE SURG POLYISO LF SZ6 (GLOVE) ×2 IMPLANT
GLOVE SURG UNDER LTX SZ6.5 (GLOVE) ×2 IMPLANT
GLOVE SURG UNDER POLY LF SZ6 (GLOVE) ×2 IMPLANT
GOWN STRL REUS W/ TWL LRG LVL3 (GOWN DISPOSABLE) ×3 IMPLANT
GOWN STRL REUS W/TWL LRG LVL3 (GOWN DISPOSABLE) ×6
GRASPER SUT TROCAR 14GX15 (MISCELLANEOUS) ×2 IMPLANT
KIT BASIN OR (CUSTOM PROCEDURE TRAY) ×2 IMPLANT
KIT TURNOVER KIT B (KITS) ×2 IMPLANT
NEEDLE INSUFFLATION 14GA 120MM (NEEDLE) ×2 IMPLANT
NS IRRIG 1000ML POUR BTL (IV SOLUTION) ×2 IMPLANT
PAD ARMBOARD 7.5X6 YLW CONV (MISCELLANEOUS) ×2 IMPLANT
POUCH SPECIMEN RETRIEVAL 10MM (ENDOMECHANICALS) ×2 IMPLANT
SCISSORS LAP 5X35 DISP (ENDOMECHANICALS) ×2 IMPLANT
SET CHOLANGIOGRAPH 5 50 .035 (SET/KITS/TRAYS/PACK) IMPLANT
SET IRRIG TUBING LAPAROSCOPIC (IRRIGATION / IRRIGATOR) ×2 IMPLANT
SET TUBE SMOKE EVAC HIGH FLOW (TUBING) ×2 IMPLANT
SLEEVE ENDOPATH XCEL 5M (ENDOMECHANICALS) ×4 IMPLANT
SPONGE T-LAP 18X18 ~~LOC~~+RFID (SPONGE) ×2 IMPLANT
SUT MNCRL AB 4-0 PS2 18 (SUTURE) ×2 IMPLANT
TOWEL GREEN STERILE (TOWEL DISPOSABLE) ×2 IMPLANT
TOWEL GREEN STERILE FF (TOWEL DISPOSABLE) ×2 IMPLANT
TRAY LAPAROSCOPIC MC (CUSTOM PROCEDURE TRAY) ×2 IMPLANT
TROCAR XCEL NON-BLD 11X100MML (ENDOMECHANICALS) ×2 IMPLANT
TROCAR XCEL NON-BLD 5MMX100MML (ENDOMECHANICALS) ×2 IMPLANT
WATER STERILE IRR 1000ML POUR (IV SOLUTION) ×2 IMPLANT

## 2021-05-10 NOTE — Progress Notes (Signed)
PROGRESS NOTE    Kara Dean  MBW:[id number] DOB: 03-04-1933 DOA: 05/06/2021 PCP: [name], FNP    No chief complaint on file.   Brief Narrative:  87 year old female with past medical history of hyperlipidemia, insulin-dependent diabetes mellitus type 2, hypertension, gastroesophageal reflux disease, previous TIA, bilateral carotid artery stenosis as well as reported remote history of Crohn's diagnosed over 30 years ago not on treatment who presents to [facility] emergency department with complaints of abdominal pain nausea and vomiting.  -Work-up in the ED was notable for leukocytosis, CT abdomen was concerning for CBD stone ultrasound noted distended gallbladder with pericholecystic fluid -General surgery and GI were consulted along with cardiology for preop clearance -MRCP was negative for choledocholithiasis -Cardiology recommended Myoview, this was a low risk study  Assessment & Plan:   Choledocholithiasis with acute cholecystitis MRCP showed no CBD stones, signs of acute cholecystitis present -Continue IV ceftriaxone, Flagyl -LFTs improving, suspect passed stone -Appreciate cardiology input, completed Myoview yesterday which was low risk -Plan for lap chole today  Essential hypertension -Blood pressure poorly controlled -Continue amlodipine, Avapro, hydralazine as needed  Type 2 diabetes mellitus, insulin-dependent with stage IIIa CKD. -Continue SSI, CBGS stable  GERD without esophagitis Continue PPI  Stage IIIa CKD Creatinine better than baseline  DVT prophylaxis: SCD'S, start Lovenox tomorrow Code Status: Full code Family Communication: daughter at bedside Disposition:   Status is: Inpatient  Remains inpatient appropriate because:Ongoing diagnostic testing needed not appropriate for outpatient work up  Dispo: The patient is from: Home              Anticipated d/c is to:  pending.               Patient currently is not medically stable  to d/c.   Difficult to place patient No       Consultants:  General surgery GI.  Cardiology.   Procedures: MRCP ERCP  Antimicrobials:  Antibiotics Given (last 72 hours)     Date/Time Action Medication Dose Rate   05/07/21 1603 New Bag/Given   [MAR Hold] metroNIDAZOLE (FLAGYL) IVPB 500 mg (MAR Hold since Thu 05/10/2021 at 1156.Hold Reason: Transfer to a Procedural area) 500 mg 100 mL/hr   05/07/21 2053 New Bag/Given   [MAR Hold] cefTRIAXone (ROCEPHIN) 2 g in sodium chloride 0.9 % 100 mL IVPB (MAR Hold since Thu 05/10/2021 at 1156.Hold Reason: Transfer to a Procedural area) 2 g 200 mL/hr   05/07/21 2119 New Bag/Given   [MAR Hold] metroNIDAZOLE (FLAGYL) IVPB 500 mg (MAR Hold since Thu 05/10/2021 at 1156.Hold Reason: Transfer to a Procedural area) 500 mg 100 mL/hr   05/08/21 0530 New Bag/Given   [MAR Hold] metroNIDAZOLE (FLAGYL) IVPB 500 mg (MAR Hold since Thu 05/10/2021 at 1156.Hold Reason: Transfer to a Procedural area) 500 mg 100 mL/hr   05/08/21 1330 New Bag/Given   [MAR Hold] metroNIDAZOLE (FLAGYL) IVPB 500 mg (MAR Hold since Thu 05/10/2021 at 1156.Hold Reason: Transfer to a Procedural area) 500 mg 100 mL/hr   05/08/21 2145 New Bag/Given   [MAR Hold] cefTRIAXone (ROCEPHIN) 2 g in sodium chloride 0.9 % 100 mL IVPB (MAR Hold since Thu 05/10/2021 at 1156.Hold Reason: Transfer to a Procedural area) 2 g 200 mL/hr   05/08/21 2313 New Bag/Given   [MAR Hold] metroNIDAZOLE (FLAGYL) IVPB 500 mg (MAR Hold since Thu 05/10/2021 at 1156.Hold Reason: Transfer to a Procedural area) 500 mg 100 mL/hr   05/09/21 0651 New Bag/Given   [MAR Hold] metroNIDAZOLE (FLAGYL) IVPB 500 mg (  MAR Hold since Thu 05/10/2021 at 1156.Hold Reason: Transfer to a Procedural area) 500 mg 100 mL/hr   05/09/21 1428 New Bag/Given   [MAR Hold] metroNIDAZOLE (FLAGYL) IVPB 500 mg (MAR Hold since Thu 05/10/2021 at 1156.Hold Reason: Transfer to a Procedural area) 500 mg 100 mL/hr   05/09/21 2149 New Bag/Given   [MAR Hold] cefTRIAXone  (ROCEPHIN) 2 g in sodium chloride 0.9 % 100 mL IVPB (MAR Hold since Thu 05/10/2021 at 1156.Hold Reason: Transfer to a Procedural area) 2 g 200 mL/hr   05/09/21 2254 New Bag/Given   [MAR Hold] metroNIDAZOLE (FLAGYL) IVPB 500 mg (MAR Hold since Thu 05/10/2021 at 1156.Hold Reason: Transfer to a Procedural area) 500 mg 100 mL/hr   05/10/21 0614 New Bag/Given   [MAR Hold] metroNIDAZOLE (FLAGYL) IVPB 500 mg (MAR Hold since Thu 05/10/2021 at 1156.Hold Reason: Transfer to a Procedural area) 500 mg 100 mL/hr         Subjective: -Feels okay, no complaints, decided to proceed with surgery  Objective: Vitals:   05/09/21 1808 05/10/21 0106 05/10/21 0524 05/10/21 0927  BP: (!) 125/50 (!) 152/55 (!) 158/50 (!) 151/49  Pulse: (!) 55 (!) 57 (!) 59 60  Resp: 16 17 17    Temp: 98.4 F (36.9 C) 98.5 F (36.9 C) 98.6 F (37 C)   TempSrc: Oral Oral Oral   SpO2: 93% 98%    Weight:      Height:        Intake/Output Summary (Last 24 hours) at 05/10/2021 1228 Last data filed at 05/10/2021 0932 Gross per 24 hour  Intake --  Output 300 ml  Net -300 ml   Filed Weights   05/06/21 2028  Weight: 56.2 kg    Examination: Gen: Awake, Alert, Oriented X 3,  HEENT: no JVD Lungs: Clear CVS: S1S2/RRR Abd: soft, Non tender, non distended, BS present Extremities: No edema Skin: no new rashes on exposed skin  Psychiatry: Mood & affect appropriate.     Data Reviewed: I have personally reviewed following labs and imaging studies  CBC: Recent Labs  Lab 05/06/21 2033 05/07/21 0345 05/10/21 0133  WBC 12.5* 9.8 8.6  NEUTROABS 11.1* 8.5*  --   HGB 11.4* 12.5 11.5*  HCT 35.4* 38.8 35.1*  MCV 95.7 95.3 92.9  PLT 192 195 355    Basic Metabolic Panel: Recent Labs  Lab 05/06/21 2033 05/07/21 0345 05/08/21 1032 05/10/21 0133  NA 139 140 140 138  K 4.2 4.9 3.6 3.5  CL 106 104 108 108  CO2 25 25 25 23   GLUCOSE 157* 201* 109* 138*  BUN 18 15 12 11   CREATININE 1.20* 1.14* 0.94 1.06*  CALCIUM 9.6 9.8 9.1  9.0  MG  --  2.1  --   --     GFR: Estimated Creatinine Clearance: 30.5 mL/min (A) (by C-G formula based on SCr of 1.06 mg/dL (H)).  Liver Function Tests: Recent Labs  Lab 05/06/21 2033 05/07/21 0345 05/08/21 1032 05/10/21 0133  AST 114* 161* 52* 31  ALT 52* 98* 55* 34  ALKPHOS 76 94 73 63  BILITOT 1.6* 1.2 0.7 0.9  PROT 6.8 7.0 6.1* 6.1*  ALBUMIN 3.6 3.4* 3.0* 2.9*    CBG: Recent Labs  Lab 05/09/21 1112 05/09/21 1612 05/09/21 2113 05/10/21 0615 05/10/21 1118  GLUCAP 148* 185* 150* 160* 120*     Recent Results (from the past 240 hour(s))  Resp Panel by RT-PCR (Flu A&B, Covid) Nasopharyngeal Swab     Status: None   Collection Time:  05/06/21  9:24 PM   Specimen: Nasopharyngeal Swab; Nasopharyngeal(NP) swabs in vial transport medium  Result Value Ref Range Status   SARS Coronavirus 2 by RT PCR NEGATIVE NEGATIVE Final    Comment: (NOTE) SARS-CoV-2 target nucleic acids are NOT DETECTED.  The SARS-CoV-2 RNA is generally detectable in upper respiratory specimens during the acute phase of infection. The lowest concentration of SARS-CoV-2 viral copies this assay can detect is 138 copies/mL. A negative result does not preclude SARS-Cov-2 infection and should not be used as the sole basis for treatment or other patient management decisions. A negative result may occur with  improper specimen collection/handling, submission of specimen other than nasopharyngeal swab, presence of viral mutation(s) within the areas targeted by this assay, and inadequate number of viral copies(<138 copies/mL). A negative result must be combined with clinical observations, patient history, and epidemiological information. The expected result is Negative.  Fact Sheet for Patients:  EntrepreneurPulse.com.au  Fact Sheet for Healthcare Providers:  IncredibleEmployment.be  This test is no t yet approved or cleared by the [place] FDA and  has been  authorized for detection and/or diagnosis of SARS-CoV-2 by FDA under an Emergency Use Authorization (EUA). This EUA will remain  in effect (meaning this test can be used) for the duration of the COVID-19 declaration under Section 564(b)(1) of the Act, 21 U.S.C.section 360bbb-3(b)(1), unless the authorization is terminated  or revoked sooner.       Influenza A by PCR NEGATIVE NEGATIVE Final   Influenza B by PCR NEGATIVE NEGATIVE Final    Comment: (NOTE) The Xpert Xpress SARS-CoV-2/FLU/RSV plus assay is intended as an aid in the diagnosis of influenza from Nasopharyngeal swab specimens and should not be used as a sole basis for treatment. Nasal washings and aspirates are unacceptable for Xpert Xpress SARS-CoV-2/FLU/RSV testing.  Fact Sheet for Patients: EntrepreneurPulse.com.au  Fact Sheet for Healthcare Providers: IncredibleEmployment.be  This test is not yet approved or cleared by the [place] FDA and has been authorized for detection and/or diagnosis of SARS-CoV-2 by FDA under an Emergency Use Authorization (EUA). This EUA will remain in effect (meaning this test can be used) for the duration of the COVID-19 declaration under Section 564(b)(1) of the Act, 21 U.S.C. section 360bbb-3(b)(1), unless the authorization is terminated or revoked.  Performed at [facility], [place] [address], [place], [place] [address]      Radiology Studies: NM Myocar Multi W/Spect Tamela Oddi Motion / EF  Result Date: 05/09/2021 CLINICAL DATA:  Pre-op assessment, intermediate wrist surgery. History of diabetes, hypertension and hyperlipidemia. EXAM: MYOCARDIAL IMAGING WITH SPECT (REST AND PHARMACOLOGIC-STRESS) GATED LEFT VENTRICULAR WALL MOTION STUDY LEFT VENTRICULAR EJECTION FRACTION TECHNIQUE: Standard myocardial SPECT imaging was performed after resting intravenous injection of 10.2 mCi Tc-8mtetrofosmin. Subsequently, intravenous infusion of Lexiscan was  performed under the supervision of the Cardiology staff. At peak effect of the drug, 33.0 mCi Tc-928metrofosmin was injected intravenously and standard myocardial SPECT imaging was performed. Quantitative gated imaging was also performed to evaluate left ventricular wall motion, and estimate left ventricular ejection fraction. COMPARISON:  None. FINDINGS: Perfusion: No decreased activity in the left ventricle on stress imaging to suggest reversible ischemia or infarction. Mild apical thinning. Wall Motion: Normal left ventricular wall motion. No left ventricular dilation. Left Ventricular Ejection Fraction: 63 % End diastolic volume 75 ml End systolic volume 28 ml IMPRESSION: 1. No reversible ischemia or infarction. 2. Normal left ventricular wall motion. 3. Left ventricular ejection fraction 63% 4. Non invasive risk stratification*:  Low *2012 Appropriate Use Criteria for Coronary Revascularization Focused Update: J Am Coll Cardiol. 2903;79(5):583-167. http://content.airportbarriers.com.aspx?articleid=1201161 Electronically Signed   By: Richardean Sale M.D.   On: 05/09/2021 15:01     Scheduled Meds:  [MAR Hold] amLODipine  10 mg Oral Daily   [MAR Hold] citalopram  20 mg Oral Daily   [MAR Hold] insulin aspart  0-9 Units Subcutaneous TID AC & HS   [MAR Hold] irbesartan  300 mg Oral Daily   [MAR Hold] metoprolol succinate  100 mg Oral Daily   [MAR Hold] pantoprazole  40 mg Oral Q0600   Continuous Infusions:  [MAR Hold] cefTRIAXone (ROCEPHIN)  IV 2 g (05/09/21 2149)   lactated ringers 10 mL/hr at 05/10/21 1221   [MAR Hold] metronidazole 500 mg (05/10/21 0614)     LOS: 3 days    Domenic Polite, MD [facility]  05/10/2021, 12:28 PM

## 2021-05-10 NOTE — Progress Notes (Signed)
Subjective: CC: No abdominal pain, n/v overnight. Wants to proceed with surgery. Daughter at bedside.   Objective: Vital signs in last 24 hours: Temp:  [98.4 F (36.9 C)-98.6 F (37 C)] 98.6 F (37 C) (07/07 0524) Pulse Rate:  [52-86] 59 (07/07 0524) Resp:  [16-17] 17 (07/07 0524) BP: (125-190)/(50-68) 158/50 (07/07 0524) SpO2:  [93 %-98 %] 98 % (07/07 0106)    Intake/Output from previous day: 07/06 0701 - 07/07 0700 In: -  Out: 300 [Urine:300] Intake/Output this shift: No intake/output data recorded.  PE: Gen:  Alert, NAD, pleasant HEENT: EOM's intact, pupils equal and round Card:  Reg Pulm:  Rate and effort normal  Abd: Soft, NT/ND, +BS, prior midline scar noted and well healed Psych: A&Ox3  Skin: no rashes noted, warm and dry   Lab Results:  Recent Labs    05/10/21 0133  WBC 8.6  HGB 11.5*  HCT 35.1*  PLT 185   BMET Recent Labs    05/08/21 1032 05/10/21 0133  NA 140 138  K 3.6 3.5  CL 108 108  CO2 25 23  GLUCOSE 109* 138*  BUN 12 11  CREATININE 0.94 1.06*  CALCIUM 9.1 9.0   PT/INR No results for input(s): LABPROT, INR in the last 72 hours. CMP     Component Value Date/Time   NA 138 05/10/2021 0133   NA 141 05/01/2021 1116   K 3.5 05/10/2021 0133   CL 108 05/10/2021 0133   CO2 23 05/10/2021 0133   GLUCOSE 138 (H) 05/10/2021 0133   BUN 11 05/10/2021 0133   BUN 20 05/01/2021 1116   CREATININE 1.06 (H) 05/10/2021 0133   CREATININE 0.92 02/26/2013 1043   CALCIUM 9.0 05/10/2021 0133   PROT 6.1 (L) 05/10/2021 0133   PROT 7.0 05/01/2021 1116   ALBUMIN 2.9 (L) 05/10/2021 0133   ALBUMIN 4.5 05/01/2021 1116   AST 31 05/10/2021 0133   ALT 34 05/10/2021 0133   ALKPHOS 63 05/10/2021 0133   BILITOT 0.9 05/10/2021 0133   BILITOT 0.5 05/01/2021 1116   GFRNONAA 51 (L) 05/10/2021 0133   GFRNONAA 60 02/26/2013 1043   GFRAA 51 (L) 08/21/2020 1202   GFRAA 69 02/26/2013 1043   Lipase     Component Value Date/Time   LIPASE 31 05/06/2021  2033       Studies/Results: NM Myocar Multi W/Spect W/Wall Motion / EF  Result Date: 05/09/2021 CLINICAL DATA:  Pre-op assessment, intermediate wrist surgery. History of diabetes, hypertension and hyperlipidemia. EXAM: MYOCARDIAL IMAGING WITH SPECT (REST AND PHARMACOLOGIC-STRESS) GATED LEFT VENTRICULAR WALL MOTION STUDY LEFT VENTRICULAR EJECTION FRACTION TECHNIQUE: Standard myocardial SPECT imaging was performed after resting intravenous injection of 10.2 mCi Tc-52mtetrofosmin. Subsequently, intravenous infusion of Lexiscan was performed under the supervision of the Cardiology staff. At peak effect of the drug, 33.0 mCi Tc-949metrofosmin was injected intravenously and standard myocardial SPECT imaging was performed. Quantitative gated imaging was also performed to evaluate left ventricular wall motion, and estimate left ventricular ejection fraction. COMPARISON:  None. FINDINGS: Perfusion: No decreased activity in the left ventricle on stress imaging to suggest reversible ischemia or infarction. Mild apical thinning. Wall Motion: Normal left ventricular wall motion. No left ventricular dilation. Left Ventricular Ejection Fraction: 63 % End diastolic volume 75 ml End systolic volume 28 ml IMPRESSION: 1. No reversible ischemia or infarction. 2. Normal left ventricular wall motion. 3. Left ventricular ejection fraction 63% 4. Non invasive risk stratification*: Low *2012 Appropriate Use Criteria for Coronary Revascularization  Focused Update: J Am Coll Cardiol. 2012;59(9):857-881. http://content.onlinejacc.org/article.aspx?articleid=1201161 Electronically Signed   By: William  Veazey M.D.   On: 05/09/2021 15:01    Anti-infectives: Anti-infectives (From admission, onward)    Start     Dose/Rate Route Frequency Ordered Stop   05/07/21 2215  cefTRIAXone (ROCEPHIN) 2 g in sodium chloride 0.9 % 100 mL IVPB       See Hyperspace for full Linked Orders Report.   2 g 200 mL/hr over 30 Minutes Intravenous Every  24 hours 05/07/21 0158     05/07/21 0600  metroNIDAZOLE (FLAGYL) IVPB 500 mg        500 mg 100 mL/hr over 60 Minutes Intravenous Every 8 hours 05/07/21 0158     05/07/21 0215  cefTRIAXone (ROCEPHIN) 1 g in sodium chloride 0.9 % 100 mL IVPB       See Hyperspace for full Linked Orders Report.   1 g 200 mL/hr over 30 Minutes Intravenous  Once 05/07/21 0158 05/07/21 0304   05/06/21 2330  cefTRIAXone (ROCEPHIN) 1 g in sodium chloride 0.9 % 100 mL IVPB        1 g 200 mL/hr over 30 Minutes Intravenous  Once 05/06/21 2319 05/07/21 0025   05/06/21 2330  metroNIDAZOLE (FLAGYL) IVPB 500 mg        500 mg 100 mL/hr over 60 Minutes Intravenous  Once 05/06/21 2319 05/07/21 0145        Assessment/Plan Acute Cholecystitis - MRCP negative for Choledocholithiasis - LFTs normalized. WBC wnl - Cont abx  - Cardiology recs noted. Patient at "acceptable risk for the planned procedure without further cardiovascular testing". I met with the patient and her 2 daughters yesterday afternoon. I explained the indications, procedure, risks, and aftercare of Laparoscopic cholecystectomy.  Risks include but are not limited to anesthesia (MI, CVA, death), bleeding, infection, wound problems, hernia, bile leak, injury to common bile duct/liver/intestine and diarrhea post op. We also discussed the option of nonoperative treatment which carries risks of ongoing symptoms that she is having, worsening cholecystitis, recurrent choledocholithiasis etc. Family discussed overnight. Patient would like to proceed with surgery. Will plan for OR today pending availability. Discussed with family that pending availability, her case may be delayed until tomorrow  FEN - NPO VTE - SCDs, per [facility] ID - Rocephin/Flagyl  HTN HLD T2DM PAD Prior TIA Hx Crohns (s/p exlap and 'bowel resection' 1990s, no treatment since)   LOS: 3 days     M  , PA-C [facility] 05/10/2021, 8:45 AM Please see Amion for pager  number during day hours 7:00am-4:30pm  

## 2021-05-10 NOTE — Op Note (Signed)
Operative Note  Kara Dean 87 y.o. female [id number]  05/10/2021  Surgeon: Clovis Riley MD FACS  Procedure performed: Laparoscopic Cholecystectomy  Procedure classification: urgent  Preop diagnosis: Cholecystitis Post-op diagnosis/intraop findings: same  Specimens: gallbladder  Retained items: none  EBL: minimal  Complications: none  Description of procedure: After obtaining informed consent the patient was brought to the operating room. Antibiotics were administered. SCD's were applied. General endotracheal anesthesia was initiated and a formal time-out was performed. The abdomen was prepped and draped in the usual sterile fashion and the abdomen was entered using  a left subcostal Veress needle  after instilling the site with local. Insufflation to 37mHg was obtained, right subcostal 559mtrocar and camera inserted via optical entry, and gross inspection revealed no evidence of injury from our entry or other intraabdominal abnormalities.  The Veress needle was removed after confirming no injury from entry.  There are adhesions of stomach, colon and omentum to the midline from her previous laparotomy.  Two 59m10mrocars were introduced in the periumbilical and and right anterior axillary lines under direct visualization avoiding her adhesive disease and following infiltration with local. An 15m559mocar was placed in the epigastrium.  The gallbladder is distended with with omental adhesions along the body consistent with chronic cholecystitis.  The gallbladder was retracted cephalad and the infundibulum was retracted laterally.  Of note the tissue was very delicate and did tear from retraction spilling granular stones and dark bilious fluid which was all aspirated prior to the end of the case.  A combination of hook electrocautery and blunt dissection was utilized to clear the peritoneum from the neck and cystic duct, circumferentially isolating the cystic artery and cystic duct and lifting  the gallbladder from the cystic plate. The critical view of safety was achieved with the cystic artery, cystic duct, and liver bed visualized between them with no other structures. The artery was clipped with 2 clips proximally and 1 distally and divided as was the cystic duct with three clips on the proximal end. The gallbladder was dissected from the liver plate using electrocautery. Once freed the gallbladder was placed in an endocatch bag and removed through the epigastric trocar site. A small amount of bleeding on the liver bed was controlled with cautery. The right upper quadrant was irrigated copiously until the effluent was clear. Hemostasis was once again confirmed, and reinspection of the abdomen revealed no injuries. The clips were well opposed without any bile leak from the duct or the liver bed. The 15mm66mcar site in the epigastrium was closed with a 0 vicryl in the fascia under direct visualization using a PMI device. The abdomen was desufflated and all trocars removed. The skin incisions were closed with subcuticular monocryl and Dermabond. The patient was awakened, extubated and transported to the recovery room in stable condition.    All counts were correct at the completion of the case.

## 2021-05-10 NOTE — Anesthesia Preprocedure Evaluation (Addendum)
Anesthesia Evaluation  Patient identified by MRN, date of birth, ID band Patient awake    Reviewed: Allergy & Precautions, NPO status , Patient's Chart, lab work & pertinent test results  History of Anesthesia Complications Negative for: history of anesthetic complications  Airway Mallampati: I  TM Distance: >3 FB Neck ROM: Full    Dental  (+) Edentulous Upper, Edentulous Lower, Dental Advisory Given   Pulmonary neg pulmonary ROS,    Pulmonary exam normal        Cardiovascular hypertension, Pt. on medications and Pt. on home beta blockers Normal cardiovascular exam  NM Stress test 2022 IMPRESSION: 1. No reversible ischemia or infarction.  2. Normal left ventricular wall motion.  3. Left ventricular ejection fraction 63%  4. Non invasive risk stratification*: Low   Neuro/Psych PSYCHIATRIC DISORDERS Anxiety Depression CVA, No Residual Symptoms    GI/Hepatic Neg liver ROS, GERD  ,  Endo/Other  diabetes  Renal/GU negative Renal ROS     Musculoskeletal  (+) Arthritis ,   Abdominal   Peds  Hematology negative hematology ROS (+) anemia ,   Anesthesia Other Findings   Reproductive/Obstetrics                            Anesthesia Physical Anesthesia Plan  ASA: 3  Anesthesia Plan: General   Post-op Pain Management:    Induction: Intravenous  PONV Risk Score and Plan: 4 or greater and Ondansetron, Dexamethasone, Diphenhydramine and Treatment may vary due to age or medical condition  Airway Management Planned: Oral ETT  Additional Equipment:   Intra-op Plan:   Post-operative Plan: Extubation in OR  Informed Consent: I have reviewed the patients History and Physical, chart, labs and discussed the procedure including the risks, benefits and alternatives for the proposed anesthesia with the patient or authorized representative who has indicated his/her understanding and acceptance.      Dental advisory given  Plan Discussed with: Anesthesiologist and CRNA  Anesthesia Plan Comments:        Anesthesia Quick Evaluation

## 2021-05-10 NOTE — Transfer of Care (Signed)
Immediate Anesthesia Transfer of Care Note  Patient: Kara Dean  Procedure(s) Performed: LAPAROSCOPIC CHOLECYSTECTOMY  Patient Location: PACU  Anesthesia Type:General  Level of Consciousness: awake, alert  and oriented  Airway & Oxygen Therapy: Patient Spontanous Breathing  Post-op Assessment: Report given to RN, Post -op Vital signs reviewed and stable and Patient moving all extremities  Post vital signs: Reviewed and stable  Last Vitals:  Vitals Value Taken Time  BP 150/49 05/10/21 1442  Temp    Pulse 66 05/10/21 1443  Resp 17 05/10/21 1443  SpO2 98 % 05/10/21 1443  Vitals shown include unvalidated device data.  Last Pain:  Vitals:   05/10/21 1240  TempSrc: Oral  PainSc:          Complications: No notable events documented.

## 2021-05-10 NOTE — Anesthesia Procedure Notes (Signed)
Procedure Name: Intubation Date/Time: 05/10/2021 1:34 PM Performed by: Fulton Reek, CRNA Pre-anesthesia Checklist: Patient identified, Emergency Drugs available, Suction available and Patient being monitored Patient Re-evaluated:Patient Re-evaluated prior to induction Oxygen Delivery Method: Circle System Utilized Preoxygenation: Pre-oxygenation with 100% oxygen Induction Type: IV induction Ventilation: Mask ventilation without difficulty Laryngoscope Size: Mac and 3 Grade View: Grade I Tube type: Oral Tube size: 7.0 mm Number of attempts: 1 Airway Equipment and Method: Stylet Placement Confirmation: ETT inserted through vocal cords under direct vision, positive ETCO2 and breath sounds checked- equal and bilateral Secured at: 21 cm Tube secured with: Tape Dental Injury: Teeth and Oropharynx as per pre-operative assessment  Comments: Performed by Heide Scales, SRNA under direct supervision by Dr. Tobias Alexander and Fulton Reek, CRNA

## 2021-05-11 ENCOUNTER — Encounter (HOSPITAL_COMMUNITY): Payer: Self-pay | Admitting: Surgery

## 2021-05-11 DIAGNOSIS — K8042 Calculus of bile duct with acute cholecystitis without obstruction: Secondary | ICD-10-CM | POA: Diagnosis not present

## 2021-05-11 LAB — BASIC METABOLIC PANEL
Anion gap: 9 (ref 5–15)
BUN: 18 mg/dL (ref 8–23)
CO2: 21 mmol/L — ABNORMAL LOW (ref 22–32)
Calcium: 9.5 mg/dL (ref 8.9–10.3)
Chloride: 107 mmol/L (ref 98–111)
Creatinine, Ser: 1.08 mg/dL — ABNORMAL HIGH (ref 0.44–1.00)
GFR, Estimated: 50 mL/min — ABNORMAL LOW (ref >60)
Glucose, Bld: 204 mg/dL — ABNORMAL HIGH (ref 70–99)
Potassium: 4.1 mmol/L (ref 3.5–5.1)
Sodium: 137 mmol/L (ref 135–145)

## 2021-05-11 LAB — CBC
HCT: 37.8 % (ref 36.0–46.0)
Hemoglobin: 12.3 g/dL (ref 12.0–15.0)
MCH: 30.3 pg (ref 26.0–34.0)
MCHC: 32.5 g/dL (ref 30.0–36.0)
MCV: 93.1 fL (ref 80.0–100.0)
Platelets: 219 10*3/uL (ref 150–400)
RBC: 4.06 MIL/uL (ref 3.87–5.11)
RDW: 12.3 % (ref 11.5–15.5)
WBC: 9.2 10*3/uL (ref 4.0–10.5)
nRBC: 0 % (ref 0.0–0.2)

## 2021-05-11 LAB — URINALYSIS, ROUTINE W REFLEX MICROSCOPIC
Bacteria, UA: NONE SEEN
Bilirubin Urine: NEGATIVE
Glucose, UA: 50 mg/dL — AB
Hgb urine dipstick: NEGATIVE
Ketones, ur: 20 mg/dL — AB
Nitrite: NEGATIVE
Protein, ur: NEGATIVE mg/dL
Specific Gravity, Urine: 1.021 (ref 1.005–1.030)
pH: 5 (ref 5.0–8.0)

## 2021-05-11 LAB — GLUCOSE, CAPILLARY
Glucose-Capillary: 183 mg/dL — ABNORMAL HIGH (ref 70–99)
Glucose-Capillary: 227 mg/dL — ABNORMAL HIGH (ref 70–99)
Glucose-Capillary: 209 mg/dL — ABNORMAL HIGH (ref 70–99)
Glucose-Capillary: 215 mg/dL — ABNORMAL HIGH (ref 70–99)

## 2021-05-11 LAB — SURGICAL PATHOLOGY

## 2021-05-11 MED ORDER — ENOXAPARIN SODIUM 30 MG/0.3ML IJ SOSY
30.0000 mg | PREFILLED_SYRINGE | Freq: Every day | INTRAMUSCULAR | Status: DC
  Administered 2021-05-11: 30 mg via SUBCUTANEOUS
  Filled 2021-05-11: qty 0.3

## 2021-05-11 NOTE — Discharge Instructions (Signed)
[facility]  Please arrive at least 30 min before your appointment to complete your check in paperwork.  If you are unable to arrive 30 min prior to your appointment time we may have to cancel or reschedule you. LAPAROSCOPIC SURGERY: POST OP INSTRUCTIONS Always review your discharge instruction sheet given to you by the facility where your surgery was performed. IF YOU HAVE DISABILITY OR FAMILY LEAVE FORMS, YOU MUST BRING THEM TO THE OFFICE FOR PROCESSING.   DO NOT GIVE THEM TO YOUR DOCTOR.  PAIN CONTROL  First take acetaminophen (Tylenol) AND/or ibuprofen (Advil) to control your pain after surgery.  Follow directions on package.  Taking acetaminophen (Tylenol) and/or ibuprofen (Advil) regularly after surgery will help to control your pain and lower the amount of prescription pain medication you may need.  You should not take more than 4,000 mg (4 grams) of acetaminophen (Tylenol) in 24 hours.  You should not take ibuprofen (Advil), aleve, motrin, naprosyn or other NSAIDS if you have a history of stomach ulcers or chronic kidney disease.  A prescription for pain medication may be given to you upon discharge.  Take your pain medication as prescribed, if you still have uncontrolled pain after taking acetaminophen (Tylenol) or ibuprofen (Advil). Use ice packs to help control pain. If you need a refill on your pain medication, please contact your pharmacy.  They will contact our office to request authorization. Prescriptions will not be filled after 5pm or on week-ends.  HOME MEDICATIONS Take your usually prescribed medications unless otherwise directed.  DIET You should follow a light diet the first few days after arrival home.  Be sure to include lots of fluids daily. Avoid fatty, fried foods.   CONSTIPATION It is common to experience some constipation after surgery and if you are taking pain medication.  Increasing fluid intake and taking a stool softener (such as Colace)  will usually help or prevent this problem from occurring.  A mild laxative (Milk of Magnesia or Miralax) should be taken according to package instructions if there are no bowel movements after 48 hours.  WOUND/INCISION CARE Most patients will experience some swelling and bruising in the area of the incisions.  Ice packs will help.  Swelling and bruising can take several days to resolve.  Unless discharge instructions indicate otherwise, follow guidelines below  STERI-STRIPS - you may remove your outer bandages 48 hours after surgery, and you may shower at that time.  You have steri-strips (small skin tapes) in place directly over the incision.  These strips should be left on the skin for 7-10 days.   DERMABOND/SKIN GLUE - you may shower in 24 hours.  The glue will flake off over the next 2-3 weeks. Any sutures or staples will be removed at the office during your follow-up visit.  ACTIVITIES You may resume regular (light) daily activities beginning the next day--such as daily self-care, walking, climbing stairs--gradually increasing activities as tolerated.  You may have sexual intercourse when it is comfortable.  Refrain from any heavy lifting or straining until approved by your doctor. You may drive when you are no longer taking prescription pain medication, you can comfortably wear a seatbelt, and you can safely maneuver your car and apply brakes.  FOLLOW-UP You should see your doctor in the office for a follow-up appointment approximately 2-3 weeks after your surgery.  You should have been given your post-op/follow-up appointment when your surgery was scheduled.  If you did not receive a post-op/follow-up appointment, make sure  that you call for this appointment within a day or two after you arrive home to insure a convenient appointment time.   WHEN TO CALL YOUR DOCTOR: Fever over 101.0 Inability to urinate Continued bleeding from incision. Increased pain, redness, or drainage from the  incision. Increasing abdominal pain  The clinic staff is available to answer your questions during regular business hours.  Please don't hesitate to call and ask to speak to one of the nurses for clinical concerns.  If you have a medical emergency, go to the nearest emergency room or call 911.  A surgeon from [facility] is always on call at the hospital. [address] [place], [place], [place]  [address] ? [address], [place], [place]   [address] [phone] ? [phone] ? FAX [phone]

## 2021-05-11 NOTE — Progress Notes (Signed)
1 Day Post-Op  Subjective: CC: Doing well. Some soreness around the incisions. Tolerating cld without n/v. Voiding. Has not gotten out of bed yet.   Objective: Vital signs in last 24 hours: Temp:  [97.4 F (36.3 C)-98.3 F (36.8 C)] 98.3 F (36.8 C) (07/08 0450) Pulse Rate:  [48-66] 55 (07/08 0450) Resp:  [11-18] 15 (07/08 0450) BP: (121-151)/(35-50) 140/49 (07/08 0450) SpO2:  [91 %-96 %] 96 % (07/08 0450) Last BM Date: 05/06/21  Intake/Output from previous day: 07/07 0701 - 07/08 0700 In: 500 [I.V.:400; IV Piggyback:100] Out: 525 [Urine:500; Blood:25] Intake/Output this shift: No intake/output data recorded.  PE: Gen:  Alert, NAD, pleasant Card:  Reg Pulm:  Normal rate and effort Abd: Soft,  mild distension, appropriately tender around laparoscopic incisions, otherwise NT, +BS, Incisions with glue intact appears well and are without drainage, bleeding, or signs of infection Psych: A&Ox3  Skin: no rashes noted, warm and dry   Lab Results:  Recent Labs    05/10/21 0133 05/11/21 0220  WBC 8.6 9.2  HGB 11.5* 12.3  HCT 35.1* 37.8  PLT 185 219   BMET Recent Labs    05/10/21 0133 05/11/21 0220  NA 138 137  K 3.5 4.1  CL 108 107  CO2 23 21*  GLUCOSE 138* 204*  BUN 11 18  CREATININE 1.06* 1.08*  CALCIUM 9.0 9.5   PT/INR No results for input(s): LABPROT, INR in the last 72 hours. CMP     Component Value Date/Time   NA 137 05/11/2021 0220   NA 141 05/01/2021 1116   K 4.1 05/11/2021 0220   CL 107 05/11/2021 0220   CO2 21 (L) 05/11/2021 0220   GLUCOSE 204 (H) 05/11/2021 0220   BUN 18 05/11/2021 0220   BUN 20 05/01/2021 1116   CREATININE 1.08 (H) 05/11/2021 0220   CREATININE 0.92 02/26/2013 1043   CALCIUM 9.5 05/11/2021 0220   PROT 6.1 (L) 05/10/2021 0133   PROT 7.0 05/01/2021 1116   ALBUMIN 2.9 (L) 05/10/2021 0133   ALBUMIN 4.5 05/01/2021 1116   AST 31 05/10/2021 0133   ALT 34 05/10/2021 0133   ALKPHOS 63 05/10/2021 0133   BILITOT 0.9  05/10/2021 0133   BILITOT 0.5 05/01/2021 1116   GFRNONAA 50 (L) 05/11/2021 0220   GFRNONAA 60 02/26/2013 1043   GFRAA 51 (L) 08/21/2020 1202   GFRAA 69 02/26/2013 1043   Lipase     Component Value Date/Time   LIPASE 31 05/06/2021 2033       Studies/Results: NM Myocar Multi W/Spect W/Wall Motion / EF  Result Date: 05/09/2021 CLINICAL DATA:  Pre-op assessment, intermediate wrist surgery. History of diabetes, hypertension and hyperlipidemia. EXAM: MYOCARDIAL IMAGING WITH SPECT (REST AND PHARMACOLOGIC-STRESS) GATED LEFT VENTRICULAR WALL MOTION STUDY LEFT VENTRICULAR EJECTION FRACTION TECHNIQUE: Standard myocardial SPECT imaging was performed after resting intravenous injection of 10.2 mCi Tc-67mtetrofosmin. Subsequently, intravenous infusion of Lexiscan was performed under the supervision of the Cardiology staff. At peak effect of the drug, 33.0 mCi Tc-938metrofosmin was injected intravenously and standard myocardial SPECT imaging was performed. Quantitative gated imaging was also performed to evaluate left ventricular wall motion, and estimate left ventricular ejection fraction. COMPARISON:  None. FINDINGS: Perfusion: No decreased activity in the left ventricle on stress imaging to suggest reversible ischemia or infarction. Mild apical thinning. Wall Motion: Normal left ventricular wall motion. No left ventricular dilation. Left Ventricular Ejection Fraction: 63 % End diastolic volume 75 ml End systolic volume 28 ml IMPRESSION: 1. No reversible  ischemia or infarction. 2. Normal left ventricular wall motion. 3. Left ventricular ejection fraction 63% 4. Non invasive risk stratification*: Low *2012 Appropriate Use Criteria for Coronary Revascularization Focused Update: J Am Coll Cardiol. 2536;64(4):034-742. http://content.airportbarriers.com.aspx?articleid=[email or web address] Electronically Signed   By: Richardean Sale M.D.   On: 05/09/2021 15:01    Anti-infectives: Anti-infectives (From admission, onward)     Start     Dose/Rate Route Frequency Ordered Stop   05/07/21 2215  cefTRIAXone (ROCEPHIN) 2 g in sodium chloride 0.9 % 100 mL IVPB  Status:  Discontinued       See Hyperspace for full Linked Orders Report.   2 g 200 mL/hr over 30 Minutes Intravenous Every 24 hours 05/07/21 0158 05/10/21 1538   05/07/21 0600  metroNIDAZOLE (FLAGYL) IVPB 500 mg  Status:  Discontinued        500 mg 100 mL/hr over 60 Minutes Intravenous Every 8 hours 05/07/21 0158 05/10/21 1538   05/07/21 0215  cefTRIAXone (ROCEPHIN) 1 g in sodium chloride 0.9 % 100 mL IVPB       See Hyperspace for full Linked Orders Report.   1 g 200 mL/hr over 30 Minutes Intravenous  Once 05/07/21 0158 05/07/21 0304   05/06/21 2330  cefTRIAXone (ROCEPHIN) 1 g in sodium chloride 0.9 % 100 mL IVPB        1 g 200 mL/hr over 30 Minutes Intravenous  Once 05/06/21 2319 05/07/21 0025   05/06/21 2330  metroNIDAZOLE (FLAGYL) IVPB 500 mg        500 mg 100 mL/hr over 60 Minutes Intravenous  Once 05/06/21 2319 05/07/21 0145        Assessment/Plan POD 1, s/p Laparoscopic Cholecystectomy by Dr. Kae Heller on 05/10/21 - MRCP negative for Choledocholithiasis. LFTs normalized pre-op.  - Adv to [facility]. PT ordered - Pulm toilet, IS   FEN - HH VTE - SCDs, okay for chemical prophylaxis from a general surgery standpoint ID - Rocephin/Flagyl 7/3 - 7/7   HTN HLD T2DM PAD Prior TIA Hx Crohns (s/p exlap and 'bowel resection' 1990s, no treatment since)     LOS: 4 days    Jillyn Ledger , [facility] Surgery 05/11/2021, 9:21 AM Please see Amion for pager number during day hours 7:00am-4:30pm

## 2021-05-11 NOTE — Anesthesia Postprocedure Evaluation (Signed)
Anesthesia Post Note  Patient: Kara Dean  Procedure(s) Performed: LAPAROSCOPIC CHOLECYSTECTOMY     Patient location during evaluation: PACU Anesthesia Type: General Level of consciousness: awake and alert Pain management: pain level controlled Vital Signs Assessment: post-procedure vital signs reviewed and stable Respiratory status: spontaneous breathing, nonlabored ventilation, respiratory function stable and patient connected to nasal cannula oxygen Cardiovascular status: blood pressure returned to baseline and stable Postop Assessment: no apparent nausea or vomiting Anesthetic complications: no   No notable events documented.  Last Vitals:  Vitals:   05/10/21 2319 05/11/21 0450  BP: (!) 146/50 (!) 140/49  Pulse: (!) 58 (!) 55  Resp: 18 15  Temp: (!) 36.4 C 36.8 C  SpO2: 95% 96%    Last Pain:  Vitals:   05/11/21 0450  TempSrc: Oral  PainSc:                  Kara Dean

## 2021-05-11 NOTE — Evaluation (Signed)
Physical Therapy Evaluation Patient Details Name: Kara Dean MRN: [id number] DOB: 1932/11/26 Today's Date: 05/11/2021   History of Present Illness  The pt is an 87 yo female presenting 7/3 due to c/o abdominal pain and nausea/vomiting. work-up revealed acute cholecystitis and the pt is now s/p laparoscopic cholecystectomy on 7/7.  PMH includes: HLD, DM II, HTN, GERD, TIA, carotid artery stenosis, and Chron's disease.   Clinical Impression  Pt in bed upon arrival of PT, agreeable to evaluation at this time. Prior to admission the pt was completely independent with mobility without need for AD on most days, the pt reports she was able to walk community distances without assist and was independent with ADLs. The pt reports no falls in last 6 months, but frequent "staggers" with upright activity. The pt now presents with limitations in functional mobility, strength, stability, and endurance due to above dx, and will continue to benefit from skilled PT to address these deficits. The pt was able to complete bed mobility and sit-stand transfers with minG for safety. The pt was able to complete short bout of hallway ambulation without overt LOB, but does demo decreased stride length, clearance, and heavy reliance on BUE support which indicates increased risk of falls. The pt will benefit from skilled PT acutely as well as following return home to maximize recovery of stability and independence.      Follow Up Recommendations Home health PT;Supervision for mobility/OOB    Equipment Recommendations  None recommended by PT (pt well equipped)    Recommendations for Other Services       Precautions / Restrictions Precautions Precautions: Fall (abdominal) Restrictions Weight Bearing Restrictions: No      Mobility  Bed Mobility Overal bed mobility: Needs Assistance Bed Mobility: Supine to Sit;Sit to Supine     Supine to sit: Min assist Sit to supine: Min assist   General bed mobility  comments: minA with VC to complete rolling to minimize strain and pain in abdomen. pt needing minA to complete roll and elevation of trunk from [facility].    Transfers Overall transfer level: Needs assistance Equipment used: Rolling walker (2 wheeled) Transfers: Sit to/from Stand Sit to Stand: Supervision         General transfer comment: supervision with cues for hand position with sit-stand. no lob with initial transfer (pt does report staggering steps with initial stand is normal for her at home)  Ambulation/Gait Ambulation/Gait assistance: Min guard Gait Distance (Feet): 75 Feet Assistive device: Rolling walker (2 wheeled) Gait Pattern/deviations: Step-through pattern;Decreased stride length;Shuffle;Trunk flexed Gait velocity: 0.2 m/s Gait velocity interpretation: <1.31 ft/sec, indicative of household ambulator General Gait Details: pt with very small, step-to pattern initially, cues for improved posture and stride length with good adherence. no overt LOB, but slow and cautious movements     Balance Overall balance assessment: Needs assistance Sitting-balance support: No upper extremity supported;Feet supported Sitting balance-Leahy Scale: Fair Sitting balance - Comments: pt reports she is a "slider" and was unable to attempt reaching outside BOS in sitting   Standing balance support: Bilateral upper extremity supported Standing balance-Leahy Scale: Poor Standing balance comment: reliant on BUE support                             Pertinent Vitals/Pain Pain Assessment: 0-10 Pain Score: 5  Pain Location: abdomen, incision sites Pain Descriptors / Indicators: Discomfort;Sore Pain Intervention(s): Limited activity within patient's tolerance;Monitored during session;Repositioned    Home Living Family/patient  expects to be discharged to:: Private residence Living Arrangements: Children Available Help at Discharge: Family;Available 24 hours/day Type of Home: Mobile  home Home Access: Stairs to enter Entrance Stairs-Rails: Right;Left Entrance Stairs-Number of Steps: 3 Home Layout: One level Home Equipment: Walker - 2 wheels;Walker - 4 wheels;Shower seat      Prior Function Level of Independence: Independent         Comments: pt reports independence with mobility, no falls in last 6 months.     Hand Dominance        Extremity/Trunk Assessment   Upper Extremity Assessment Upper Extremity Assessment: Generalized weakness    Lower Extremity Assessment Lower Extremity Assessment: Generalized weakness    Cervical / Trunk Assessment Cervical / Trunk Assessment: Other exceptions Cervical / Trunk Exceptions: s/p lap chol on 7/7  Communication   Communication: No difficulties  Cognition Arousal/Alertness: Awake/alert Behavior During Therapy: WFL for tasks assessed/performed Overall Cognitive Status: Within Functional Limits for tasks assessed                                        General Comments General comments (skin integrity, edema, etc.): VSS, HR in 50s with rest and activity    Exercises     Assessment/Plan    PT Assessment Patient needs continued PT services  PT Problem List Decreased strength;Decreased range of motion;Decreased activity tolerance;Decreased balance;Decreased mobility;Pain       PT Treatment Interventions DME instruction;Gait training;Stair training;Functional mobility training;Therapeutic activities;Therapeutic exercise;Balance training;Patient/family education    PT Goals (Current goals can be found in the Care Plan section)  Acute Rehab PT Goals Patient Stated Goal: return home, not need RW PT Goal Formulation: With patient Time For Goal Achievement: 05/25/21 Potential to Achieve Goals: Good    Frequency Min 3X/week    AM-PAC PT "6 Clicks" Mobility  Outcome Measure Help needed turning from your back to your side while in a flat bed without using bedrails?: A Little Help needed  moving from lying on your back to sitting on the side of a flat bed without using bedrails?: A Little Help needed moving to and from a bed to a chair (including a wheelchair)?: A Little Help needed standing up from a chair using your arms (e.g., wheelchair or bedside chair)?: A Little Help needed to walk in hospital room?: A Little Help needed climbing 3-5 steps with a railing? : A Little 6 Click Score: 18    End of Session Equipment Utilized During Treatment: Gait belt Activity Tolerance: Patient tolerated treatment well;Patient limited by fatigue Patient left: in bed;with call bell/phone within reach;with family/visitor present Nurse Communication: Mobility status PT Visit Diagnosis: Other abnormalities of gait and mobility (R26.89);Muscle weakness (generalized) (M62.81)    Time: 2831-5176 PT Time Calculation (min) (ACUTE ONLY): 25 min   Charges:   PT Evaluation $PT Eval Low Complexity: 1 Low PT Treatments $Gait Training: 8-22 mins        Karma Ganja, PT, DPT   Acute Rehabilitation Department Pager #: [phone]  Otho Bellows 05/11/2021, 5:51 PM

## 2021-05-11 NOTE — Progress Notes (Signed)
PROGRESS NOTE    Kara Dean  OBS:[id number] DOB: 1933-07-05 DOA: 05/06/2021 PCP: [name], FNP    No chief complaint on file.   Brief Narrative:  87 year old female with past medical history of hyperlipidemia, insulin-dependent diabetes mellitus type 2, hypertension, gastroesophageal reflux disease, previous TIA, bilateral carotid artery stenosis as well as reported remote history of Crohn's diagnosed over 30 years ago not on treatment who presents to [facility] emergency department with complaints of abdominal pain nausea and vomiting.  -Work-up in the ED was notable for leukocytosis, CT abdomen was concerning for CBD stone ultrasound noted distended gallbladder with pericholecystic fluid -General surgery and GI were consulted along with cardiology for preop clearance -MRCP was negative for choledocholithiasis -Cardiology recommended Myoview, this was a low risk study  Assessment & Plan:   Choledocholithiasis  Acute cholecystitis MRCP showed no CBD stones, signs of acute cholecystitis present -Treated with bowel rest, IV ceftriaxone, Flagyl -Seen by cardiology, completed a Myoview which was low risk -Underwent lap chole yesterday, Intra-Op findings consistent with acute cholecystitis -Advance diet today, ambulate -Discharge planning  Essential hypertension -Blood pressure poorly controlled -Continue amlodipine, Avapro, hydralazine as needed  Type 2 diabetes mellitus, insulin-dependent with stage IIIa CKD. -Continue SSI, CBGS stable  GERD without esophagitis Continue PPI  Stage IIIa CKD Creatinine better than baseline  DVT prophylaxis: lovenox Code Status: Full code Family Communication: daughter at bedside Disposition:   Status is: Inpatient  Remains inpatient appropriate because:Ongoing diagnostic testing needed not appropriate for outpatient work up  Dispo: The patient is from: Home              Anticipated d/c is to: home tomorrow               Patient currently is not medically stable to d/c.   Difficult to place patient No       Consultants:  General surgery GI.  Cardiology.   Procedures: MRCP ERCP  Antimicrobials:  Antibiotics Given (last 72 hours)     Date/Time Action Medication Dose Rate   05/08/21 1330 New Bag/Given   metroNIDAZOLE (FLAGYL) IVPB 500 mg 500 mg 100 mL/hr   05/08/21 2145 New Bag/Given   cefTRIAXone (ROCEPHIN) 2 g in sodium chloride 0.9 % 100 mL IVPB 2 g 200 mL/hr   05/08/21 2313 New Bag/Given   metroNIDAZOLE (FLAGYL) IVPB 500 mg 500 mg 100 mL/hr   05/09/21 0651 New Bag/Given   metroNIDAZOLE (FLAGYL) IVPB 500 mg 500 mg 100 mL/hr   05/09/21 1428 New Bag/Given   metroNIDAZOLE (FLAGYL) IVPB 500 mg 500 mg 100 mL/hr   05/09/21 2149 New Bag/Given   cefTRIAXone (ROCEPHIN) 2 g in sodium chloride 0.9 % 100 mL IVPB 2 g 200 mL/hr   05/09/21 2254 New Bag/Given   metroNIDAZOLE (FLAGYL) IVPB 500 mg 500 mg 100 mL/hr   05/10/21 4765 New Bag/Given   metroNIDAZOLE (FLAGYL) IVPB 500 mg 500 mg 100 mL/hr   05/10/21 1335 Given   metroNIDAZOLE (FLAGYL) IVPB 500 mg 500 mg          Subjective: -Has some abdominal pain and discomfort as expected, tolerated clears this morning  Objective: Vitals:   05/10/21 1531 05/10/21 1600 05/10/21 2319 05/11/21 0450  BP:  (!) 133/50 (!) 146/50 (!) 140/49  Pulse: (!) 52 (!) 55 (!) 58 (!) 55  Resp: 17 17 18 15   Temp:   (!) 97.5 F (36.4 C) 98.3 F (36.8 C)  TempSrc:   Oral Oral  SpO2: 93% 95% 95%  96%  Weight:      Height:        Intake/Output Summary (Last 24 hours) at 05/11/2021 1121 Last data filed at 05/11/2021 0657 Gross per 24 hour  Intake 500 ml  Output 525 ml  Net -25 ml   Filed Weights   05/06/21 2028  Weight: 56.2 kg    Examination: Gen: Gen: Awake, Alert, Oriented X 3,  HEENT: no JVD Lungs: Good air movement bilaterally, CTAB CVS: S1S2/RRR Abd: Soft, mildly distended, port sites unremarkable, tender as expected, bowel sounds  present Extremities: No edema Skin: no new rashes on exposed skin     Data Reviewed: I have personally reviewed following labs and imaging studies  CBC: Recent Labs  Lab 05/06/21 2033 05/07/21 0345 05/10/21 0133 05/11/21 0220  WBC 12.5* 9.8 8.6 9.2  NEUTROABS 11.1* 8.5*  --   --   HGB 11.4* 12.5 11.5* 12.3  HCT 35.4* 38.8 35.1* 37.8  MCV 95.7 95.3 92.9 93.1  PLT 192 195 185 962    Basic Metabolic Panel: Recent Labs  Lab 05/06/21 2033 05/07/21 0345 05/08/21 1032 05/10/21 0133 05/11/21 0220  NA 139 140 140 138 137  K 4.2 4.9 3.6 3.5 4.1  CL 106 104 108 108 107  CO2 25 25 25 23  21*  GLUCOSE 157* 201* 109* 138* 204*  BUN 18 15 12 11 18   CREATININE 1.20* 1.14* 0.94 1.06* 1.08*  CALCIUM 9.6 9.8 9.1 9.0 9.5  MG  --  2.1  --   --   --     GFR: Estimated Creatinine Clearance: 29.9 mL/min (A) (by C-G formula based on SCr of 1.08 mg/dL (H)).  Liver Function Tests: Recent Labs  Lab 05/06/21 2033 05/07/21 0345 05/08/21 1032 05/10/21 0133  AST 114* 161* 52* 31  ALT 52* 98* 55* 34  ALKPHOS 76 94 73 63  BILITOT 1.6* 1.2 0.7 0.9  PROT 6.8 7.0 6.1* 6.1*  ALBUMIN 3.6 3.4* 3.0* 2.9*    CBG: Recent Labs  Lab 05/10/21 1448 05/10/21 1646 05/10/21 2114 05/11/21 0603 05/11/21 1110  GLUCAP 162* 178* 200* 183* 227*     Recent Results (from the past 240 hour(s))  Resp Panel by RT-PCR (Flu A&B, Covid) Nasopharyngeal Swab     Status: None   Collection Time: 05/06/21  9:24 PM   Specimen: Nasopharyngeal Swab; Nasopharyngeal(NP) swabs in vial transport medium  Result Value Ref Range Status   SARS Coronavirus 2 by RT PCR NEGATIVE NEGATIVE Final    Comment: (NOTE) SARS-CoV-2 target nucleic acids are NOT DETECTED.  The SARS-CoV-2 RNA is generally detectable in upper respiratory specimens during the acute phase of infection. The lowest concentration of SARS-CoV-2 viral copies this assay can detect is 138 copies/mL. A negative result does not preclude  SARS-Cov-2 infection and should not be used as the sole basis for treatment or other patient management decisions. A negative result may occur with  improper specimen collection/handling, submission of specimen other than nasopharyngeal swab, presence of viral mutation(s) within the areas targeted by this assay, and inadequate number of viral copies(<138 copies/mL). A negative result must be combined with clinical observations, patient history, and epidemiological information. The expected result is Negative.  Fact Sheet for Patients:  EntrepreneurPulse.com.au  Fact Sheet for Healthcare Providers:  IncredibleEmployment.be  This test is no t yet approved or cleared by the [place] FDA and  has been authorized for detection and/or diagnosis of SARS-CoV-2 by FDA under an Emergency Use Authorization (EUA). This EUA will remain  in effect (meaning this test can be used) for the duration of the COVID-19 declaration under Section 564(b)(1) of the Act, 21 U.S.C.section 360bbb-3(b)(1), unless the authorization is terminated  or revoked sooner.       Influenza A by PCR NEGATIVE NEGATIVE Final   Influenza B by PCR NEGATIVE NEGATIVE Final    Comment: (NOTE) The Xpert Xpress SARS-CoV-2/FLU/RSV plus assay is intended as an aid in the diagnosis of influenza from Nasopharyngeal swab specimens and should not be used as a sole basis for treatment. Nasal washings and aspirates are unacceptable for Xpert Xpress SARS-CoV-2/FLU/RSV testing.  Fact Sheet for Patients: EntrepreneurPulse.com.au  Fact Sheet for Healthcare Providers: IncredibleEmployment.be  This test is not yet approved or cleared by the [place] FDA and has been authorized for detection and/or diagnosis of SARS-CoV-2 by FDA under an Emergency Use Authorization (EUA). This EUA will remain in effect (meaning this test can be used) for the duration of  the COVID-19 declaration under Section 564(b)(1) of the Act, 21 U.S.C. section 360bbb-3(b)(1), unless the authorization is terminated or revoked.  Performed at [facility], [place] [address] [place], [place] [address]      Radiology Studies: NM Myocar Multi W/Spect Tamela Oddi Motion / EF  Result Date: 05/09/2021 CLINICAL DATA:  Pre-op assessment, intermediate wrist surgery. History of diabetes, hypertension and hyperlipidemia. EXAM: MYOCARDIAL IMAGING WITH SPECT (REST AND PHARMACOLOGIC-STRESS) GATED LEFT VENTRICULAR WALL MOTION STUDY LEFT VENTRICULAR EJECTION FRACTION TECHNIQUE: Standard myocardial SPECT imaging was performed after resting intravenous injection of 10.2 mCi Tc-6mtetrofosmin. Subsequently, intravenous infusion of Lexiscan was performed under the supervision of the Cardiology staff. At peak effect of the drug, 33.0 mCi Tc-993metrofosmin was injected intravenously and standard myocardial SPECT imaging was performed. Quantitative gated imaging was also performed to evaluate left ventricular wall motion, and estimate left ventricular ejection fraction. COMPARISON:  None. FINDINGS: Perfusion: No decreased activity in the left ventricle on stress imaging to suggest reversible ischemia or infarction. Mild apical thinning. Wall Motion: Normal left ventricular wall motion. No left ventricular dilation. Left Ventricular Ejection Fraction: 63 % End diastolic volume 75 ml End systolic volume 28 ml IMPRESSION: 1. No reversible ischemia or infarction. 2. Normal left ventricular wall motion. 3. Left ventricular ejection fraction 63% 4. Non invasive risk stratification*: Low *2012 Appropriate Use Criteria for Coronary Revascularization Focused Update: J Am Coll Cardiol. 207619;50(9):326-712http://content.[email or web address]?articleid=[email or web address] Electronically Signed   By: WiRichardean Sale.D.   On: 05/09/2021 15:01     Scheduled Meds:  amLODipine  10 mg Oral Daily   citalopram  20 mg Oral  Daily   enoxaparin (LOVENOX) injection  30 mg Subcutaneous Daily   insulin aspart  0-9 Units Subcutaneous TID AC & HS   irbesartan  300 mg Oral Daily   metoprolol succinate  100 mg Oral Daily   pantoprazole  40 mg Oral Q0600   Continuous Infusions:    LOS: 4 days    PrDomenic PoliteMD [facility]  05/11/2021, 11:21 AM

## 2021-05-12 DIAGNOSIS — K8042 Calculus of bile duct with acute cholecystitis without obstruction: Secondary | ICD-10-CM | POA: Diagnosis not present

## 2021-05-12 LAB — GLUCOSE, CAPILLARY: Glucose-Capillary: 157 mg/dL — ABNORMAL HIGH (ref 70–99)

## 2021-05-12 MED ORDER — LANTUS SOLOSTAR 100 UNIT/ML ~~LOC~~ SOPN
25.0000 [IU] | PEN_INJECTOR | Freq: Every day | SUBCUTANEOUS | 0 refills | Status: DC
Start: 2021-05-12 — End: 2021-05-24

## 2021-05-12 MED ORDER — ONDANSETRON 4 MG PO TBDP: 4.0000 mg | ORAL_TABLET | Freq: Three times a day (TID) | ORAL | 0 refills | Status: DC | PRN

## 2021-05-12 NOTE — TOC Transition Note (Signed)
Transition of Care [facility]) - CM/SW Discharge Note   Patient Details  Name: Kara Dean MRN: [id number] Date of Birth: 22-Apr-1933  Transition of [facility]) CM/SW Contact:  Bartholomew Crews, RN Phone Number: [phone] 05/12/2021, 10:51 AM   Clinical Narrative:     Spoke with patient and her daughter, [name], at the bedside. Patient consented to having discussion about transition home with daughter in the room. Vickii Chafe is primary caregiver for her mom with assistance from her sister and other family members. Patient confirmed that she has all needed DME. Demographics, PCP, and preferred pharmacy confirmed. Patient's pharmacy closes at [place] notified. Discussed HH PT recommendations. Provided home health agency choice per Medicare list. Latricia Heft accepted referral. [name] to provide transportation home. No further TOC needs identified.   Final next level of care: [place] Barriers to Discharge: No Barriers Identified   Patient Goals and CMS Choice Patient states their goals for this hospitalization and ongoing recovery are:: home CMS Medicare.gov Compare Post Acute Care list provided to:: Patient Choice offered to / list presented to : Patient  Discharge Placement                       Discharge Plan and Services                DME Arranged: N/A DME Agency: NA       HH Arranged: PT HH Agency: [place] Date [place]: 05/12/21 Time [place]: [phone] Representative spoke with at [place]: Amy  Social Determinants of Health ([place]) Interventions     Readmission Risk Interventions No flowsheet data found.

## 2021-05-12 NOTE — Progress Notes (Signed)
Patient ID: Kara Dean, female   DOB: [birth year], 87 y.o.   MRN: [id number]  Patient is being discharged today.  Reviewed discharge instructions with the patient and her family member.  Imogene Burn. Georgette Dover, MD, [facility] Surgery  General Surgery   05/12/2021 10:45 AM

## 2021-05-17 ENCOUNTER — Other Ambulatory Visit: Payer: Self-pay | Admitting: Nurse Practitioner

## 2021-05-17 DIAGNOSIS — R609 Edema, unspecified: Secondary | ICD-10-CM

## 2021-05-23 DIAGNOSIS — H5213 Myopia, bilateral: Secondary | ICD-10-CM | POA: Diagnosis not present

## 2021-05-23 NOTE — Discharge Summary (Signed)
Physician Discharge Summary  [facility] MCN:[id number] DOB: Oct 27, 1933 DOA: 05/06/2021  PCP: [name], FNP  Admit date: 05/06/2021 Discharge date: 05/12/2021  Time spent: 35 minutes  Recommendations for Outpatient Follow-up:  [facility] in 7-10days   Discharge Diagnoses:  Principal Problem:   Choledocholithiasis with acute cholecystitis Active Problems:   Essential hypertension   Type 2 diabetes mellitus with stage 3a chronic kidney disease, with long-term current use of insulin (HCC)   Gastroesophageal reflux disease without esophagitis   GAD (generalized anxiety disorder)   Chronic kidney disease (CKD) stage G3a/A3, moderately decreased glomerular filtration rate (GFR) between 45-59 mL/min/1.73 square meter and albuminuria creatinine ratio greater than 300 mg/g (HCC)   Mixed diabetic hyperlipidemia associated with type 2 diabetes mellitus (HCC)   Abdominal pain   Elevated liver function tests   Discharge Condition: stable  Diet recommendation: DIabetic  Filed Weights   05/06/21 2028  Weight: 56.2 kg    History of present illness:  87 year old female with past medical history of hyperlipidemia, insulin-dependent diabetes mellitus type 2, hypertension, gastroesophageal reflux disease, previous TIA, bilateral carotid artery stenosis as well as reported remote history of Crohn's diagnosed over 30 years ago not on treatment who presents to [facility] with complaints of abdominal pain nausea and vomiting.  -Work-up in the ED was notable for leukocytosis, CT abdomen was concerning for CBD stone ultrasound noted distended gallbladder with pericholecystic fluid  Hospital Course:   Choledocholithiasis  Acute cholecystitis MRCP showed no CBD stones, signs of acute cholecystitis present -Treated with bowel rest, IV ceftriaxone, Flagyl -Seen by cardiology, completed a Myoview which was low risk -Underwent lap chole 7/7 , Intra-Op findings  consistent with acute cholecystitis -improved and stable now, tolerating soft diet -discharged home with family, FU with [facility] in 7-10days   Essential hypertension -Blood pressure stable now -Continue amlodipine, Avapro   Type 2 diabetes mellitus, insulin-dependent with stage IIIa CKD. -CBGS stable, Lantus resumed at much lower dose at DC as she needed significantly lower doses inpatient   GERD without esophagitis Continue PPI   Stage IIIa CKD Creatinine better than baseline   Procedure  7/7  Surgeon: Kara Riley MD FACS   Procedure performed: Laparoscopic Cholecystectomy  Discharge Exam: Vitals:   05/12/21 0050 05/12/21 0457  BP: (!) 150/53 (!) 157/50  Pulse: (!) 52 (!) 58  Resp: 20 19  Temp: 98.5 F (36.9 C) 98.5 F (36.9 C)  SpO2: 93% 95%    General: AAOx3 Cardiovascular: S1S2/RRR Respiratory: CTAB  Discharge Instructions   Discharge Instructions     Diet - low sodium heart healthy   Complete by: As directed    Discharge wound care:   Complete by: As directed    routine   Increase activity slowly   Complete by: As directed       Allergies as of 05/12/2021       Reactions   Codeine Other (See Comments)   unknown   Latex Rash   Morphine Other (See Comments)   unknown   Niacin Other (See Comments)   unknown   Sulfa Antibiotics Rash   Sulfonamide Derivatives Rash        Medication List     TAKE these medications    amLODipine 10 MG tablet Commonly known as: NORVASC Take 1 tablet (10 mg total) by mouth daily.   atorvastatin 10 MG tablet Commonly known as: LIPITOR Take 1 tablet (10 mg total) by mouth daily. (NEEDS TO BE SEEN BEFORE  NEXT REFILL)   B Complex-B12 Tabs Take 1 tablet by mouth daily.   citalopram 20 MG tablet Commonly known as: CELEXA Take 1 tablet (20 mg total) by mouth daily. (NEEDS TO BE SEEN BEFORE NEXT REFILL)   Fish Oil 1000 MG Caps Take 1 capsule by mouth daily.   Lantus SoloStar 100 UNIT/ML Solostar  Pen Generic drug: insulin glargine Inject 25 Units into the skin daily. What changed: how much to take   LORazepam 0.5 MG tablet Commonly known as: ATIVAN Take 1 tablet (0.5 mg total) by mouth 2 (two) times daily.   Magnesium 250 MG Tabs Take 250 mg by mouth daily.   olmesartan 40 MG tablet Commonly known as: BENICAR Take 1 tablet (40 mg total) by mouth daily.   ondansetron 4 MG disintegrating tablet Commonly known as: Zofran ODT Take 1 tablet (4 mg total) by mouth every 8 (eight) hours as needed for nausea or vomiting.   pantoprazole 40 MG tablet Commonly known as: PROTONIX Take 1 tablet (40 mg total) by mouth daily. (NEEDS TO BE SEEN BEFORE NEXT REFILL)   potassium chloride 10 MEQ tablet Commonly known as: KLOR-CON Take 1 tablet (10 mEq total) by mouth as directed. Mon, Wed, and Fri only.   VITAMIN D3 PO Take 1 capsule daily by mouth.       ASK your doctor about these medications    dipyridamole-aspirin 200-25 MG 12hr capsule Commonly known as: AGGRENOX TAKE  (1)  CAPSULE  TWICE DAILY.   fluticasone 50 MCG/ACT nasal spray Commonly known as: FLONASE USE 2 SPRAYS IN EACH NOSTRIL ONCE DAILY.   furosemide 20 MG tablet Commonly known as: LASIX Take 1 tablet (20 mg total) by mouth as directed. Mon, Wed, and Fri only.   metoprolol succinate 100 MG 24 hr tablet Commonly known as: TOPROL-XL TAKE 1 TABLET ONCE DAILY WITH A MEAL   OneTouch Ultra test strip Generic drug: glucose blood Check BS up to 3 times daily Dx E11.9               Discharge Care Instructions  (From admission, onward)           Start     Ordered   05/12/21 0000  Discharge wound care:       Comments: routine   05/12/21 0926           Allergies  Allergen Reactions   Codeine Other (See Comments)    unknown   Latex Rash   Morphine Other (See Comments)    unknown   Niacin Other (See Comments)    unknown   Sulfa Antibiotics Rash   Sulfonamide Derivatives Rash     Follow-up [facility] Follow up.   Specialty: General Surgery Why: Please call to confirm your appointment time. We are working hard to make this for you. Please bring a copy of your photo ID and insurance card. Please arrive 30 minutes prior to your appointment for paperwork. Contact information: [address] [place] [phone]        [name], [place] Follow up in 10 day(s).   Specialty: Family Medicine Contact information: [place] [address] [phone]         [facility] Follow up.   Specialty: Home Health Services Why: agency now goes by the name Psych[facility]rist information: [place] [id number] [phone]  The results of significant diagnostics from this hospitalization (including imaging, microbiology, ancillary and laboratory) are listed below for reference.    Significant Diagnostic Studies: CT ABDOMEN PELVIS W CONTRAST  Result Date: 05/06/2021 CLINICAL DATA:  Epigastric pain, nausea, and vomiting. History of Crohn's colitis. EXAM: CT ABDOMEN AND PELVIS WITH CONTRAST TECHNIQUE: Multidetector CT imaging of the abdomen and pelvis was performed using the standard protocol following bolus administration of intravenous contrast. CONTRAST:  72m OMNIPAQUE IOHEXOL 300 MG/ML  SOLN COMPARISON:  None. FINDINGS: Lower chest: Coarse interstitial scarring in the lung bases. Coronary artery calcifications. Small esophageal hiatal hernia. Hepatobiliary: Cholelithiasis with distended gallbladder. Moderate intrahepatic and extrahepatic bile duct dilatation. A tiny calcification is suggested in the distal common bile duct suggesting a common duct stone. Pancreas: Unremarkable. No pancreatic ductal dilatation or surrounding inflammatory changes. Spleen: Normal in size without focal abnormality. Adrenals/Urinary Tract: No  adrenal gland nodules. Small parenchymal cysts in both kidneys. Scarring in the upper pole of the right kidney. Nephrograms are otherwise symmetrical and homogeneous. No hydronephrosis or hydroureter. Bladder is unremarkable. Stomach/Bowel: Stomach, small bowel, and colon are not abnormally distended. Mild wall thickening in the rectosigmoid and descending colon likely related to the patient's history of colitis. No pericolonic inflammatory changes or abscess is identified. Appendix is not visualized. Vascular/Lymphatic: Diffuse calcification of the abdominal aorta and celiac/mesenteric branch vessels. No aneurysm. Reproductive: Prostate is unremarkable. Other: No free air or free fluid in the abdomen. Abdominal wall musculature appears intact. Musculoskeletal: Degenerative changes in the lumbar spine. No destructive bone lesions. IMPRESSION: 1. Cholelithiasis and distended gallbladder. Bile duct dilatation with probable common bile duct stone. 2. Coarse interstitial fibrosis in the lung bases. 3. Small esophageal hiatal hernia. 4. Aortic atherosclerosis. 5. Mild diffuse wall thickening of the left colon consistent with patient's history of colitis. Electronically Signed   By: WLucienne CapersM.D.   On: 05/06/2021 22:24   NM Myocar Multi W/Spect W/Wall Motion / EF  Result Date: 05/09/2021 CLINICAL DATA:  Pre-op assessment, intermediate wrist surgery. History of diabetes, hypertension and hyperlipidemia. EXAM: MYOCARDIAL IMAGING WITH SPECT (REST AND PHARMACOLOGIC-STRESS) GATED LEFT VENTRICULAR WALL MOTION STUDY LEFT VENTRICULAR EJECTION FRACTION TECHNIQUE: Standard myocardial SPECT imaging was performed after resting intravenous injection of 10.2 mCi Tc-97metrofosmin. Subsequently, intravenous infusion of Lexiscan was performed under the supervision of the Cardiology staff. At peak effect of the drug, 33.0 mCi Tc-9966mtrofosmin was injected intravenously and standard myocardial SPECT imaging was performed.  Quantitative gated imaging was also performed to evaluate left ventricular wall motion, and estimate left ventricular ejection fraction. COMPARISON:  None. FINDINGS: Perfusion: No decreased activity in the left ventricle on stress imaging to suggest reversible ischemia or infarction. Mild apical thinning. Wall Motion: Normal left ventricular wall motion. No left ventricular dilation. Left Ventricular Ejection Fraction: 63 % End diastolic volume 75 ml End systolic volume 28 ml IMPRESSION: 1. No reversible ischemia or infarction. 2. Normal left ventricular wall motion. 3. Left ventricular ejection fraction 63% 4. Non invasive risk stratification*: Low *2012 Appropriate Use Criteria for Coronary Revascularization Focused Update: J Am Coll Cardiol. 2018295;62(1):308-657ttp://[email or web address] Electronically Signed   By: WilRichardean SaleD.   On: 05/09/2021 15:01   MR ABDOMEN MRCP W WO CONTAST  Result Date: 05/07/2021 CLINICAL DATA:  Cholelithiasis. Biliary ductal dilatation on recent CT. EXAM: MRI ABDOMEN WITHOUT AND WITH CONTRAST (INCLUDING MRCP) TECHNIQUE: Multiplanar multisequence MR imaging of the abdomen was performed both before and after the administration of intravenous contrast. Heavily T2-weighted images of the  biliary and pancreatic ducts were obtained, and three-dimensional MRCP images were rendered by post processing. CONTRAST:  5.81m GADAVIST GADOBUTROL 1 MMOL/ML IV SOLN COMPARISON:  CT on 05/06/2021 FINDINGS: Lower chest: No acute findings. Hepatobiliary: No hepatic masses identified. Tiny sub-cm cyst noted in posterior right hepatic lobe. Numerous tiny gallstones are seen. The gallbladder is distended, and there is mild diffuse gallbladder wall thickening and small amount of pericholecystic fluid. These findings are suspicious for acute cholecystitis. No evidence of biliary ductal dilatation, with common bile duct measuring 5 mm. No evidence of choledocholithiasis  or biliary stricture. Mild perihepatic ascites noted. Pancreas: A simple appearing cyst is seen in the pancreatic uncinate process measuring 1.2 x 1.0 cm on image 26/5. No evidence of pancreatic ductal dilatation. Spleen:  Within normal limits in size and appearance. Adrenals/Urinary Tract: 1.9 cm subcapsular cyst is noted in the upper pole of the left kidney. Mild right renal parenchymal scarring again seen. Stomach/Bowel: Visualized portion unremarkable. Vascular/Lymphatic: No pathologically enlarged lymph nodes identified. No acute vascular findings. Other:  None. Musculoskeletal:  No suspicious bone lesions identified. IMPRESSION: Cholelithiasis, with findings suspicious for acute cholecystitis. No evidence of biliary ductal dilatation or choledocholithiasis. Mild perihepatic ascites. 1.2 cm simple appearing cyst in the pancreatic uncinate process, likely representing an indolent cystic pancreatic neoplasm such as a side-branch IPMN. In patient of this age, continued follow-up by MRI is recommended in 2 years. This recommendation follows ACR consensus guidelines: Management of Incidental Pancreatic Cysts: A White Paper of the ACR Incidental Findings Committee. [id number];02:334-356 Electronically Signed   By: JMarlaine HindM.D.   On: 05/07/2021 16:00   UKoreaAbdomen Limited RUQ (LIVER/GB)  Result Date: 05/06/2021 CLINICAL DATA:  Right upper quadrant pain. EXAM: ULTRASOUND ABDOMEN LIMITED RIGHT UPPER QUADRANT COMPARISON:  Abdominal CT earlier today. FINDINGS: Gallbladder: Distended containing multiple gallstones. There is trace pericholecystic fluid. Positive sonographic Murphy sign noted by the sonographer. No definite gallbladder wall thickening. Common bile duct: Diameter: Dilated measuring 8 mm. Probable echogenic stone in the distal common bile duct measuring 9 mm. Liver: There is intrahepatic biliary ductal dilatation. No focal lesion identified. Within normal limits in parenchymal echogenicity.  Portal vein is patent on color Doppler imaging with normal direction of blood flow towards the liver. Other: None. IMPRESSION: 1. Gallstones with distended gallbladder, trace pericholecystic fluid, and positive sonographic Murphy sign, suspicious for acute cholecystitis. 2. Intra and extrahepatic biliary ductal dilatation. Probable 9 mm stone in the distal common bile duct. Electronically Signed   By: MKeith RakeM.D.   On: 05/06/2021 23:33    Microbiology: No results found for this or any previous visit (from the past 240 hour(s)).   Labs: Basic Metabolic Panel: No results for input(s): NA, K, CL, CO2, GLUCOSE, BUN, CREATININE, CALCIUM, MG, PHOS in the last 168 hours. Liver Function Tests: No results for input(s): AST, ALT, ALKPHOS, BILITOT, PROT, ALBUMIN in the last 168 hours. No results for input(s): LIPASE, AMYLASE in the last 168 hours. No results for input(s): AMMONIA in the last 168 hours. CBC: No results for input(s): WBC, NEUTROABS, HGB, HCT, MCV, PLT in the last 168 hours. Cardiac Enzymes: No results for input(s): CKTOTAL, CKMB, CKMBINDEX, TROPONINI in the last 168 hours. BNP: BNP (last 3 results) No results for input(s): BNP in the last 8760 hours.  ProBNP (last 3 results) No results for input(s): PROBNP in the last 8760 hours.  CBG: No results for input(s): GLUCAP in the last 168 hours.  Signed:  Domenic Polite MD.  [facility] 05/23/2021, 4:49 PM

## 2021-05-24 ENCOUNTER — Encounter: Payer: Self-pay | Admitting: Nurse Practitioner

## 2021-05-24 ENCOUNTER — Ambulatory Visit (INDEPENDENT_AMBULATORY_CARE_PROVIDER_SITE_OTHER): Payer: Medicare Other | Admitting: Nurse Practitioner

## 2021-05-24 ENCOUNTER — Other Ambulatory Visit: Payer: Self-pay

## 2021-05-24 VITALS — BP 127/58 | HR 68 | Temp 97.7°F | Resp 20 | Ht 60.0 in | Wt 117.0 lb

## 2021-05-24 DIAGNOSIS — N1831 Chronic kidney disease, stage 3a: Secondary | ICD-10-CM

## 2021-05-24 DIAGNOSIS — I2510 Atherosclerotic heart disease of native coronary artery without angina pectoris: Secondary | ICD-10-CM

## 2021-05-24 DIAGNOSIS — I10 Essential (primary) hypertension: Secondary | ICD-10-CM | POA: Diagnosis not present

## 2021-05-24 DIAGNOSIS — E119 Type 2 diabetes mellitus without complications: Secondary | ICD-10-CM

## 2021-05-24 DIAGNOSIS — E782 Mixed hyperlipidemia: Secondary | ICD-10-CM | POA: Diagnosis not present

## 2021-05-24 DIAGNOSIS — R609 Edema, unspecified: Secondary | ICD-10-CM | POA: Diagnosis not present

## 2021-05-24 DIAGNOSIS — I6930 Unspecified sequelae of cerebral infarction: Secondary | ICD-10-CM

## 2021-05-24 DIAGNOSIS — F411 Generalized anxiety disorder: Secondary | ICD-10-CM

## 2021-05-24 DIAGNOSIS — Z794 Long term (current) use of insulin: Secondary | ICD-10-CM

## 2021-05-24 DIAGNOSIS — K219 Gastro-esophageal reflux disease without esophagitis: Secondary | ICD-10-CM | POA: Diagnosis not present

## 2021-05-24 DIAGNOSIS — F3342 Major depressive disorder, recurrent, in full remission: Secondary | ICD-10-CM

## 2021-05-24 DIAGNOSIS — E1169 Type 2 diabetes mellitus with other specified complication: Secondary | ICD-10-CM | POA: Diagnosis not present

## 2021-05-24 MED ORDER — LANTUS SOLOSTAR 100 UNIT/ML ~~LOC~~ SOPN: 26.0000 [IU] | PEN_INJECTOR | Freq: Every day | SUBCUTANEOUS | 5 refills | Status: DC

## 2021-05-24 MED ORDER — ASPIRIN-DIPYRIDAMOLE ER 25-200 MG PO CP12: ORAL_CAPSULE | ORAL | 1 refills | Status: DC

## 2021-05-24 MED ORDER — FUROSEMIDE 20 MG PO TABS: 20.0000 mg | ORAL_TABLET | Freq: Every day | ORAL | 1 refills | Status: DC

## 2021-05-24 NOTE — Patient Instructions (Signed)

## 2021-05-24 NOTE — Progress Notes (Signed)
Subjective:    Patient ID: Kara Dean, female    DOB: Apr 19, 1933, 87 y.o.   MRN: [id number]   Chief Complaint: medical management of chronic issues     HPI:  1. Essential hypertension No c/o chest pain,, sob or headache. Does  check blood pressure at home. Usually in the 185-UD low 149'F systolic. BP Readings from Last 3 Encounters:  05/12/21 (!) 157/50  05/01/21 132/64  04/03/21 (!) 120/40     2. Type 2 diabetes mellitus treated with insulin (HCC) Fasting blood sugars are running in 200's. She does not watch diet. Lantus was decreased to 26u daily at hospital. Lab Results  Component Value Date   HGBA1C 7.1 (H) 05/07/2021     3. Mixed diabetic hyperlipidemia associated with type 2 diabetes mellitus ([place]) Eats whatever her family fixes for her. She does no dedicated exercises. Lab Results  Component Value Date   CHOL 109 05/01/2021   HDL 42 05/01/2021   LDLCALC 40 05/01/2021   TRIG 161 (H) 05/01/2021   CHOLHDL 2.6 05/01/2021     4. Chronic kidney disease (CKD) stage G3a/A3, moderately decreased glomerular filtration rate (GFR) between 45-59 mL/min/1.73 square meter and albuminuria creatinine ratio greater than 300 mg/g (HCC) No problems voiding Lab Results  Component Value Date   CREATININE 1.08 (H) 05/11/2021     5. Gastroesophageal reflux disease without esophagitis Is on protonix daily and is doing well.  6. Recurrent major depressive disorder, in full remission ([place]) Is on celexa and is doing well. Depression screen [facility] 2/9 05/01/2021 02/19/2021 01/25/2021  Decreased Interest 1 0 3  Down, Depressed, Hopeless 1 0 3  PHQ - 2 Score 2 0 6  Altered sleeping 0 1 0  Tired, decreased energy 1 1 1   Change in appetite 1 0 1  Feeling bad or failure about yourself  1 0 1  Trouble concentrating 1 2 1   Moving slowly or fidgety/restless 0 0 0  Suicidal thoughts 0 0 0  PHQ-9 Score 6 4 10   Difficult doing work/chores Not difficult at all Somewhat difficult Not  difficult at all  Some recent data might be hidden     7. GAD (generalized anxiety disorder) Is on ativan daily and is doing well. GAD 7 : Generalized Anxiety Score 05/01/2021 01/25/2021 08/21/2020 03/03/2020  Nervous, Anxious, on Edge 1 1 1 1   Control/stop worrying 1 1 2 1   Worry too much - different things 1 1 2 1   Trouble relaxing 1 1 0 1  Restless 0 0 0 0  Easily annoyed or irritable 0 0 0 0  Afraid - awful might happen 1 0 0 0  Total GAD 7 Score 5 4 5 4   Anxiety Difficulty Not difficult at all Not difficult at all Somewhat difficult Somewhat difficult      8. Late effect of cerebrovascular accident (CVA) No permanent effects from stroke. The hospital stopped her aggrenox when she had her gall bladder taken out. Patient wants to go backon it to prevent stroke.  9. Peripheral edema Has occasional edema    Outpatient Encounter Medications as of 05/24/2021  Medication Sig   amLODipine (NORVASC) 10 MG tablet Take 1 tablet (10 mg total) by mouth daily.   atorvastatin (LIPITOR) 10 MG tablet Take 1 tablet (10 mg total) by mouth daily. (NEEDS TO BE SEEN BEFORE NEXT REFILL)   B Complex Vitamins (B COMPLEX-B12) TABS Take 1 tablet by mouth daily.   Cholecalciferol (VITAMIN D3 PO) Take 1 capsule  daily by mouth.   citalopram (CELEXA) 20 MG tablet Take 1 tablet (20 mg total) by mouth daily. (NEEDS TO BE SEEN BEFORE NEXT REFILL)   dipyridamole-aspirin (AGGRENOX) 200-25 MG 12hr capsule TAKE  (1)  CAPSULE  TWICE DAILY. (Patient taking differently: Take 1 capsule by mouth 2 (two) times daily.)   fluticasone (FLONASE) 50 MCG/ACT nasal spray USE 2 SPRAYS IN EACH NOSTRIL ONCE DAILY. (Patient taking differently: Place 2 sprays into both nostrils as needed for allergies or rhinitis.)   furosemide (LASIX) 20 MG tablet Take 1 tablet (20 mg total) by mouth daily.   glucose blood (ONETOUCH ULTRA) test strip Check BS up to 3 times daily Dx E11.9 (Patient taking differently: 1 each by Other route in the  morning, at noon, and at bedtime.)   LANTUS SOLOSTAR 100 UNIT/ML Solostar Pen Inject 25 Units into the skin daily.   LORazepam (ATIVAN) 0.5 MG tablet Take 1 tablet (0.5 mg total) by mouth 2 (two) times daily.   Magnesium 250 MG TABS Take 250 mg by mouth daily.   metoprolol succinate (TOPROL-XL) 100 MG 24 hr tablet TAKE 1 TABLET ONCE DAILY WITH A MEAL (Patient taking differently: Take 100 mg by mouth daily.)   olmesartan (BENICAR) 40 MG tablet Take 1 tablet (40 mg total) by mouth daily.   Omega-3 Fatty Acids (FISH OIL) 1000 MG CAPS Take 1 capsule by mouth daily.   ondansetron (ZOFRAN ODT) 4 MG disintegrating tablet Take 1 tablet (4 mg total) by mouth every 8 (eight) hours as needed for nausea or vomiting.   pantoprazole (PROTONIX) 40 MG tablet Take 1 tablet (40 mg total) by mouth daily. (NEEDS TO BE SEEN BEFORE NEXT REFILL)   potassium chloride (KLOR-CON) 10 MEQ tablet Take 1 tablet (10 mEq total) by mouth as directed. Mon, Wed, and Fri only.   No facility-administered encounter medications on file as of 05/24/2021.    Past Surgical History:  Procedure Laterality Date   ABDOMINAL HYSTERECTOMY     BACK SURGERY  1994   CATARACT EXTRACTION W/PHACO Left 09/19/2017   Procedure: CATARACT EXTRACTION PHACO AND INTRAOCULAR LENS PLACEMENT ([place]);  Surgeon: Baruch Goldmann, MD;  Location: [facility];  Service: Ophthalmology;  Laterality: Left;  CDE: 8.10   CATARACT EXTRACTION W/PHACO Right 10/10/2017   Procedure: CATARACT EXTRACTION PHACO AND INTRAOCULAR LENS PLACEMENT RIGHT EYE;  Surgeon: Baruch Goldmann, MD;  Location: [facility];  Service: Ophthalmology;  Laterality: Right;  CDE: 9.60   CHOLECYSTECTOMY N/A 05/10/2021   Procedure: LAPAROSCOPIC CHOLECYSTECTOMY;  Surgeon: Clovis Riley, MD;  Location: [facility];  Service: General;  Laterality: N/A;   COLON RESECTION  1990's   TUBAL LIGATION     VESICOVAGINAL FISTULA CLOSURE W/ TAH  1984    Family History  Problem Relation Age of Onset   Coronary artery disease  Sister        stent x 3   Seizures Sister    Coronary artery disease Brother        stent   Hypertension Mother    Stroke Mother    Emphysema Father    Alcohol abuse Brother    Lung disease Brother    Heart attack Daughter     New complaints: None today  Social history: Lives with her daughter  Controlled substance contract: 08/28/20      Review of Systems  Constitutional:  Negative for diaphoresis.  Eyes:  Negative for pain.  Respiratory:  Negative for shortness of breath.   Cardiovascular:  Negative for chest  pain, palpitations and leg swelling.  Gastrointestinal:  Negative for abdominal pain.  Endocrine: Negative for polydipsia.  Skin:  Negative for rash.  Neurological:  Negative for dizziness, weakness and headaches.  Hematological:  Does not bruise/bleed easily.  All other systems reviewed and are negative.     Objective:   Physical Exam Vitals and nursing note reviewed.  Constitutional:      General: She is not in acute distress.    Appearance: Normal appearance. She is well-developed.  HENT:     Head: Normocephalic.     Right Ear: Tympanic membrane normal.     Left Ear: Tympanic membrane normal.     Nose: Nose normal.     Mouth/Throat:     Mouth: Mucous membranes are moist.  Eyes:     Pupils: Pupils are equal, round, and reactive to light.  Neck:     Vascular: No carotid bruit or JVD.  Cardiovascular:     Rate and Rhythm: Normal rate and regular rhythm.     Heart sounds: Normal heart sounds.  Pulmonary:     Effort: Pulmonary effort is normal. No respiratory distress.     Breath sounds: Normal breath sounds. No wheezing or rales.  Chest:     Chest wall: No tenderness.  Abdominal:     General: Bowel sounds are normal. There is no distension or abdominal bruit.     Palpations: Abdomen is soft. There is no hepatomegaly, splenomegaly, mass or pulsatile mass.     Tenderness: There is no abdominal tenderness.  Musculoskeletal:        General: Normal  range of motion.     Cervical back: Normal range of motion and neck supple.     Comments: ambulating with a cane.  Lymphadenopathy:     Cervical: No cervical adenopathy.  Skin:    General: Skin is warm and dry.  Neurological:     Mental Status: She is alert and oriented to person, place, and time.     Deep Tendon Reflexes: Reflexes are normal and symmetric.  Psychiatric:        Behavior: Behavior normal.        Thought Content: Thought content normal.        Judgment: Judgment normal.    BP (!) 127/58   Pulse 68   Temp 97.7 F (36.5 C) (Temporal)   Resp 20   Ht 5' (1.524 m)   Wt 117 lb (53.1 kg)   SpO2 96%   BMI 22.85 kg/m        Assessment & Plan:  Kara Dean comes in today with chief complaint of Hospitalization Follow-up   Diagnosis and orders addressed:  1. Essential hypertension Low sodium diet  2. Type 2 diabetes mellitus treated with insulin (HCC) Continue to watch carbs Check blood sugars daily  3. Mixed diabetic hyperlipidemia associated with type 2 diabetes mellitus (HCC) Watch fats in diet  4. Chronic kidney disease (CKD) stage G3a/A3, moderately decreased glomerular filtration rate (GFR) between 45-59 mL/min/1.73 square meter and albuminuria creatinine ratio greater than 300 mg/g (HCC) Labs reviewed  5. Gastroesophageal reflux disease without esophagitis Avoid spicy foods Do not eat 2 hours prior to bedtime  6. Recurrent major depressive disorder, in full remission [facility]) Stress management  7. GAD (generalized anxiety disorder)  8. Late effect of cerebrovascular accident (CVA) Back on aggrenox as previously prescribed  9. Peripheral edema Elevate legs when sitting - furosemide (LASIX) 20 MG tablet; Take 1 tablet (20 mg total) by  mouth daily.  Dispense: 90 tablet; Refill: 1  10. Coronary artery disease involving native coronary artery of native heart without angina pectoris - dipyridamole-aspirin (AGGRENOX) 200-25 MG 12hr capsule; TAKE   (1)  CAPSULE  TWICE DAILY.  Dispense: 180 capsule; Refill: 1   Labs pending Health Maintenance reviewed Diet and exercise encouraged  Follow up plan: 3 months   [name], FNP

## 2021-05-31 ENCOUNTER — Ambulatory Visit (INDEPENDENT_AMBULATORY_CARE_PROVIDER_SITE_OTHER): Payer: Medicare Other | Admitting: Licensed Clinical Social Worker

## 2021-05-31 ENCOUNTER — Telehealth: Payer: Self-pay

## 2021-05-31 DIAGNOSIS — E119 Type 2 diabetes mellitus without complications: Secondary | ICD-10-CM

## 2021-05-31 DIAGNOSIS — Z794 Long term (current) use of insulin: Secondary | ICD-10-CM | POA: Diagnosis not present

## 2021-05-31 DIAGNOSIS — N1831 Chronic kidney disease, stage 3a: Secondary | ICD-10-CM

## 2021-05-31 DIAGNOSIS — I10 Essential (primary) hypertension: Secondary | ICD-10-CM | POA: Diagnosis not present

## 2021-05-31 DIAGNOSIS — F3342 Major depressive disorder, recurrent, in full remission: Secondary | ICD-10-CM

## 2021-05-31 DIAGNOSIS — E1142 Type 2 diabetes mellitus with diabetic polyneuropathy: Secondary | ICD-10-CM | POA: Diagnosis not present

## 2021-05-31 DIAGNOSIS — M79676 Pain in unspecified toe(s): Secondary | ICD-10-CM | POA: Diagnosis not present

## 2021-05-31 DIAGNOSIS — E785 Hyperlipidemia, unspecified: Secondary | ICD-10-CM

## 2021-05-31 DIAGNOSIS — L84 Corns and callosities: Secondary | ICD-10-CM | POA: Diagnosis not present

## 2021-05-31 DIAGNOSIS — K219 Gastro-esophageal reflux disease without esophagitis: Secondary | ICD-10-CM

## 2021-05-31 DIAGNOSIS — F411 Generalized anxiety disorder: Secondary | ICD-10-CM

## 2021-05-31 DIAGNOSIS — B351 Tinea unguium: Secondary | ICD-10-CM | POA: Diagnosis not present

## 2021-05-31 DIAGNOSIS — I6930 Unspecified sequelae of cerebral infarction: Secondary | ICD-10-CM

## 2021-05-31 NOTE — Chronic Care Management (AMB) (Signed)
Chronic Care Management    Clinical Social Work Note  05/31/2021 Name: Kara Dean MRN: [id number] DOB: 01-18-1933  Kara Dean is a 87 y.o. year old female who is a primary care patient of [name], [employer]. The CCM team was consulted to assist the patient with chronic disease management and/or care coordination Dean related to: [employer] .   Engaged with patient by telephone for follow up visit in response to provider referral for social work chronic care management and care coordination services.   Consent to Services:  The patient was given information about Chronic Care Management services, agreed to services, and gave verbal consent prior to initiation of services.  Please see initial visit note for detailed documentation.   Patient agreed to services and consent obtained.   Assessment: Review of patient past medical history, allergies, medications, and health status, including review of relevant consultants reports was performed today as part of a comprehensive evaluation and provision of chronic care management and care coordination services.     SDOH (Social Determinants of Health) assessments and interventions performed:  SDOH Interventions    Flowsheet Row Most Recent Value  SDOH Interventions   Depression Interventions/Treatment  Medication        Advanced Directives Status: See Vynca application for related entries.  CCM Care Plan  Allergies  Allergen Reactions   Codeine Other (See Comments)    unknown   Latex Rash   Morphine Other (See Comments)    unknown   Niacin Other (See Comments)    unknown   Sulfa Antibiotics Rash   Sulfonamide Derivatives Rash    Outpatient Encounter Medications as of 05/31/2021  Medication Sig   amLODipine (NORVASC) 10 MG tablet Take 1 tablet (10 mg total) by mouth daily.   atorvastatin (LIPITOR) 10 MG tablet Take 1 tablet (10 mg total) by mouth daily. (Dean TO BE SEEN BEFORE NEXT REFILL)   B Complex  Vitamins (B COMPLEX-B12) TABS Take 1 tablet by mouth daily.   Cholecalciferol (VITAMIN D3 PO) Take 1 capsule daily by mouth.   citalopram (CELEXA) 20 MG tablet Take 1 tablet (20 mg total) by mouth daily. (Dean TO BE SEEN BEFORE NEXT REFILL)   dipyridamole-aspirin (AGGRENOX) 200-25 MG 12hr capsule TAKE  (1)  CAPSULE  TWICE DAILY.   fluticasone (FLONASE) 50 MCG/ACT nasal spray USE 2 SPRAYS IN EACH NOSTRIL ONCE DAILY. (Patient taking differently: Place 2 sprays into both nostrils as needed for allergies or rhinitis.)   furosemide (LASIX) 20 MG tablet Take 1 tablet (20 mg total) by mouth daily.   glucose blood (ONETOUCH ULTRA) test strip Check BS up to 3 times daily Dx E11.9 (Patient taking differently: 1 each by Other route in the morning, at noon, and at bedtime.)   LANTUS SOLOSTAR 100 UNIT/ML Solostar Pen Inject 26 Units into the skin daily.   LORazepam (ATIVAN) 0.5 MG tablet Take 1 tablet (0.5 mg total) by mouth 2 (two) times daily.   Magnesium 250 MG TABS Take 250 mg by mouth daily.   metoprolol succinate (TOPROL-XL) 100 MG 24 hr tablet TAKE 1 TABLET ONCE DAILY WITH A MEAL (Patient taking differently: Take 100 mg by mouth daily.)   olmesartan (BENICAR) 40 MG tablet Take 1 tablet (40 mg total) by mouth daily.   Omega-3 Fatty Acids (FISH OIL) 1000 MG CAPS Take 1 capsule by mouth daily.   ondansetron (ZOFRAN ODT) 4 MG disintegrating tablet Take 1 tablet (4 mg total) by mouth every 8 (eight) hours as  needed for nausea or vomiting.   pantoprazole (PROTONIX) 40 MG tablet Take 1 tablet (40 mg total) by mouth daily. (Dean TO BE SEEN BEFORE NEXT REFILL)   potassium chloride (KLOR-CON) 10 MEQ tablet Take 1 tablet (10 mEq total) by mouth as directed. Mon, Wed, and Fri only.   No facility-administered encounter medications on file as of 05/31/2021.    Patient Active Problem List   Diagnosis Date Noted   Elevated liver function tests    Choledocholithiasis with acute cholecystitis 05/07/2021    Peripheral edema 03/18/2017   Chronic kidney disease (CKD) stage G3a/A3, moderately decreased glomerular filtration rate (GFR) between 45-59 mL/min/1.73 square meter and albuminuria creatinine ratio greater than 300 mg/g (HCC) 07/27/2014   Gastroesophageal reflux disease without esophagitis 04/27/2014   GAD (generalized anxiety disorder) 04/27/2014   Late effect of cerebrovascular accident (CVA) 03/26/2010   Depression 02/01/2009   Essential hypertension 02/01/2009   Osteoarthritis 02/01/2009    Conditions to be addressed/monitored: monitor client management of anxiety and depression issues   Care Plan : LCSW Care Plan  Updates made by Katha Cabal, LCSW since 05/31/2021 12:00 AM     Problem: Emotional Distress      Goal: Manage Anxiety and Depession issues faced   Start Date: 05/31/2021  Expected End Date: 08/21/2021  This Visit's Progress: On track  Recent Progress: On track  Priority: Medium  Note:   Current Barriers:  Chronic Mental Health Dean related to management of anxiety and depression issues Decreased appetite Mobility challenges Suicidal Ideation/Homicidal Ideation: No  Clinical Social Work Goal(s):  patient will work with SW monthly by telephone or in person to reduce or manage symptoms related to anxiety and depression issues of client Patient will attend all scheduled client medical appointments in next 30 days Patient will call RNCM or LCSW as needed for CCM support in next 30 days  Interventions: 1:1 collaboration with [name], [place] regarding development and update of comprehensive plan of care as evidenced by provider attestation and co-signature Chart reviewed to determine client Dean Talked briefly with son of client today about client Dean  Spoke with client today about client needs LCSW and client spoke of her recent [place] surgery Talked with client about appetite of client Talked with client about medication procurement of  client Talked with client about edema issues (she said she has some swelling in her feet and has to prop her legs up at night to help swelling go down) Talked with client about ambulation of client (she said she has a cane and a walker to use as needed to help her walk) Talked with client about upcoming client medical appointments Talked with client about mood of client (she said she does get sad occasionally) Client informed LCSW today that she was unsure how to take her prescribed medications since she had her recent surgery.  LCSW called [facility] Triage Nurse, Jan, today and informed Jan of client medication questions. Jan said she would call client today and would talk with her today about her medication questions  Patient Self Care Activities:  Takes medications as prescribed Tries to complete ADLs as able  Patient Coping Strengths:  Has family support Attends scheduled medical appointments Has no transport Dean  Patient Self Care Deficits:  Decreased appetite Some mobility issues  Patient Goals:  - spend time or talk with others every day - practice relaxation or meditation daily - keep a calendar with appointment dates  Follow Up Plan:  LCSW to call  client or her son on 07/10/21     Norva Riffle.Tashauna Caisse MSW, LCSW Licensed Clinical Social Worker [facility] Care Management [phone]

## 2021-05-31 NOTE — Telephone Encounter (Signed)
Left message to call back  

## 2021-05-31 NOTE — Patient Instructions (Signed)
Visit Information  PATIENT GOALS:  Goals Addressed             This Visit's Progress    Manage Anxiety and Depression issues faced       Timeframe:  Short-Term Goal Priority:  Medium Progress: On Track Start Date:             05/31/21                Expected End Date:   08/21/21  Follow Up Date 07/10/21    Manage My Emotions (Patient)  Manage Anxiety and Depression issues faced    Why is this important?   When you are stressed, down or upset, your body reacts too.  For example, your blood pressure may get higher; you may have a headache or stomachache.  When your emotions get the best of you, your body's ability to fight off cold and flu gets weak.  These steps will help you manage your emotions.    Patient Self Care Activities:  Takes medications as prescribed Tries to complete ADLs as able  Patient Coping Strengths:  Has family support Attends scheduled medical appointments Has no transport needs  Patient Self Care Deficits:  Decreased appetite Some mobility issues  Patient Goals:  - spend time or talk with others every day - practice relaxation or meditation daily - keep a calendar with appointment dates  Follow Up Plan:  LCSW to call client or her son on 07/10/21           Norva Riffle.Tyrese Ficek MSW, LCSW Licensed Clinical Social Worker [facility] Care Management [phone]

## 2021-05-31 NOTE — Telephone Encounter (Signed)
As we discussed at appointment and written in office note- start back on aggrenox as previously prescribed.

## 2021-05-31 NOTE — Telephone Encounter (Signed)
Patient had gallbladder removed on 05/06/21.  She has not restarted her Aggrenox because she was unsure if she was supposed to and would like to know if she should restart medication.  Please advise.

## 2021-06-20 ENCOUNTER — Other Ambulatory Visit (HOSPITAL_COMMUNITY): Payer: Self-pay | Admitting: Physician Assistant

## 2021-06-20 ENCOUNTER — Ambulatory Visit (HOSPITAL_COMMUNITY)
Admission: RE | Admit: 2021-06-20 | Discharge: 2021-06-20 | Disposition: A | Discharge status: Home / Self Care | Payer: Medicare Other | Source: Ambulatory Visit | Attending: Cardiology | Admitting: Cardiology

## 2021-06-20 ENCOUNTER — Other Ambulatory Visit: Payer: Self-pay

## 2021-06-20 DIAGNOSIS — I38 Endocarditis, valve unspecified: Secondary | ICD-10-CM | POA: Diagnosis not present

## 2021-06-20 DIAGNOSIS — I6523 Occlusion and stenosis of bilateral carotid arteries: Secondary | ICD-10-CM | POA: Diagnosis not present

## 2021-06-20 DIAGNOSIS — E782 Mixed hyperlipidemia: Secondary | ICD-10-CM | POA: Diagnosis not present

## 2021-06-20 DIAGNOSIS — I10 Essential (primary) hypertension: Secondary | ICD-10-CM | POA: Insufficient documentation

## 2021-06-20 DIAGNOSIS — I6522 Occlusion and stenosis of left carotid artery: Secondary | ICD-10-CM

## 2021-07-04 ENCOUNTER — Encounter: Payer: Self-pay | Admitting: *Deleted

## 2021-07-04 ENCOUNTER — Other Ambulatory Visit: Payer: Self-pay | Admitting: *Deleted

## 2021-07-04 DIAGNOSIS — I6523 Occlusion and stenosis of bilateral carotid arteries: Secondary | ICD-10-CM

## 2021-07-10 ENCOUNTER — Telehealth: Payer: Medicare Other

## 2021-07-23 DIAGNOSIS — H52223 Regular astigmatism, bilateral: Secondary | ICD-10-CM | POA: Diagnosis not present

## 2021-07-23 DIAGNOSIS — H524 Presbyopia: Secondary | ICD-10-CM | POA: Diagnosis not present

## 2021-08-02 ENCOUNTER — Ambulatory Visit: Payer: Self-pay | Admitting: Nurse Practitioner

## 2021-08-09 DIAGNOSIS — E1142 Type 2 diabetes mellitus with diabetic polyneuropathy: Secondary | ICD-10-CM | POA: Diagnosis not present

## 2021-08-09 DIAGNOSIS — M79676 Pain in unspecified toe(s): Secondary | ICD-10-CM | POA: Diagnosis not present

## 2021-08-09 DIAGNOSIS — L84 Corns and callosities: Secondary | ICD-10-CM | POA: Diagnosis not present

## 2021-08-09 DIAGNOSIS — B351 Tinea unguium: Secondary | ICD-10-CM | POA: Diagnosis not present

## 2021-08-10 ENCOUNTER — Telehealth: Payer: Medicare Other

## 2021-08-13 ENCOUNTER — Ambulatory Visit (INDEPENDENT_AMBULATORY_CARE_PROVIDER_SITE_OTHER): Payer: Medicare Other | Admitting: Licensed Clinical Social Worker

## 2021-08-13 DIAGNOSIS — E119 Type 2 diabetes mellitus without complications: Secondary | ICD-10-CM

## 2021-08-13 DIAGNOSIS — Z794 Long term (current) use of insulin: Secondary | ICD-10-CM

## 2021-08-13 DIAGNOSIS — K219 Gastro-esophageal reflux disease without esophagitis: Secondary | ICD-10-CM

## 2021-08-13 DIAGNOSIS — I6930 Unspecified sequelae of cerebral infarction: Secondary | ICD-10-CM

## 2021-08-13 DIAGNOSIS — N1831 Chronic kidney disease, stage 3a: Secondary | ICD-10-CM

## 2021-08-13 DIAGNOSIS — F411 Generalized anxiety disorder: Secondary | ICD-10-CM

## 2021-08-13 DIAGNOSIS — F3342 Major depressive disorder, recurrent, in full remission: Secondary | ICD-10-CM

## 2021-08-13 DIAGNOSIS — I10 Essential (primary) hypertension: Secondary | ICD-10-CM

## 2021-08-13 DIAGNOSIS — E785 Hyperlipidemia, unspecified: Secondary | ICD-10-CM

## 2021-08-13 NOTE — Chronic Care Management (AMB) (Signed)
Chronic Care Management    Clinical Social Work Note  08/13/2021 Name: Kara Dean MRN: [id number] DOB: [birth year]  Kara Dean is a 87 y.o. year old female who is a primary care patient of Kara Dean, Kara Dean. The CCM team was consulted to assist the patient with chronic disease management and/or care coordination needs related to: [employer] .   Engaged with patient by telephone for follow up visit in response to provider referral for social work chronic care management and care coordination services.   Consent to Services:  The patient was given information about Chronic Care Management services, agreed to services, and gave verbal consent prior to initiation of services.  Please see initial visit note for detailed documentation.   Patient agreed to services and consent obtained.   Assessment: Review of patient past medical history, allergies, medications, and health status, including review of relevant consultants reports was performed today as part of a comprehensive evaluation and provision of chronic care management and care coordination services.     SDOH (Social Determinants of Health) assessments and interventions performed:  SDOH Interventions    Flowsheet Row Most Recent Value  SDOH Interventions   Physical Activity Interventions Other (Comments)  [walking challenges,  uses a cane to help her walk]  Stress Interventions Provide Counseling  [client has stress related to managing medical needs,  has stress related to financial challenges]  Depression Interventions/Treatment  Medication        Advanced Directives Status: See Vynca application for related entries.  CCM Care Plan  Allergies  Allergen Reactions   Codeine Other (See Comments)    unknown   Latex Rash   Morphine Other (See Comments)    unknown   Niacin Other (See Comments)    unknown   Sulfa Antibiotics Rash   Sulfonamide Derivatives Rash    Outpatient Encounter Medications as  of 08/13/2021  Medication Sig   amLODipine (NORVASC) 10 MG tablet Take 1 tablet (10 mg total) by mouth daily.   atorvastatin (LIPITOR) 10 MG tablet Take 1 tablet (10 mg total) by mouth daily. (NEEDS TO BE SEEN BEFORE NEXT REFILL)   B Complex Vitamins (B COMPLEX-B12) TABS Take 1 tablet by mouth daily.   Cholecalciferol (VITAMIN D3 PO) Take 1 capsule daily by mouth.   citalopram (CELEXA) 20 MG tablet Take 1 tablet (20 mg total) by mouth daily. (NEEDS TO BE SEEN BEFORE NEXT REFILL)   dipyridamole-aspirin (AGGRENOX) 200-25 MG 12hr capsule TAKE  (1)  CAPSULE  TWICE DAILY.   fluticasone (FLONASE) 50 MCG/ACT nasal spray USE 2 SPRAYS IN EACH NOSTRIL ONCE DAILY. (Patient taking differently: Place 2 sprays into both nostrils as needed for allergies or rhinitis.)   furosemide (LASIX) 20 MG tablet Take 1 tablet (20 mg total) by mouth daily.   glucose blood (ONETOUCH ULTRA) test strip Check BS up to 3 times daily Dx E11.9 (Patient taking differently: 1 each by Other route in the morning, at noon, and at bedtime.)   LANTUS SOLOSTAR 100 UNIT/ML Solostar Pen Inject 26 Units into the skin daily.   LORazepam (ATIVAN) 0.5 MG tablet Take 1 tablet (0.5 mg total) by mouth 2 (two) times daily.   Magnesium 250 MG TABS Take 250 mg by mouth daily.   metoprolol succinate (TOPROL-XL) 100 MG 24 hr tablet TAKE 1 TABLET ONCE DAILY WITH A MEAL (Patient taking differently: Take 100 mg by mouth daily.)   olmesartan (BENICAR) 40 MG tablet Take 1 tablet (40 mg total) by mouth daily.  Omega-3 Fatty Acids (FISH OIL) 1000 MG CAPS Take 1 capsule by mouth daily.   ondansetron (ZOFRAN ODT) 4 MG disintegrating tablet Take 1 tablet (4 mg total) by mouth every 8 (eight) hours as needed for nausea or vomiting.   pantoprazole (PROTONIX) 40 MG tablet Take 1 tablet (40 mg total) by mouth daily. (NEEDS TO BE SEEN BEFORE NEXT REFILL)   potassium chloride (KLOR-CON) 10 MEQ tablet Take 1 tablet (10 mEq total) by mouth as directed. Mon, Wed, and Fri  only.   No facility-administered encounter medications on file as of 08/13/2021.    Patient Active Problem List   Diagnosis Date Noted   Elevated liver function tests    Choledocholithiasis with acute cholecystitis 05/07/2021   Peripheral edema 03/18/2017   Chronic kidney disease (CKD) stage G3a/A3, moderately decreased glomerular filtration rate (GFR) between 45-59 mL/min/1.73 square meter and albuminuria creatinine ratio greater than 300 mg/g (HCC) 07/27/2014   Gastroesophageal reflux disease without esophagitis 04/27/2014   GAD (generalized anxiety disorder) 04/27/2014   Late effect of cerebrovascular accident (CVA) 03/26/2010   Depression 02/01/2009   Essential hypertension 02/01/2009   Osteoarthritis 02/01/2009    Conditions to be addressed/monitored: monitor client management of anxiety and depression issues  Care Plan : LCSW Care Plan  Updates made by Kara Cabal, LCSW since 08/13/2021 12:00 AM     Problem: Emotional Distress      Goal: Manage Anxiety and Depession issues faced   Start Date: 08/13/2021  Expected End Date: 11/09/2021  This Visit's Progress: On track  Recent Progress: On track  Priority: Medium  Note:   Current Barriers:  Chronic Mental Health needs related to management of anxiety and depression issues Decreased appetite Mobility challenges Suicidal Ideation/Homicidal Ideation: No  Clinical Social Work Goal(s):  patient will work with SW monthly by telephone or in person to reduce or manage symptoms related to anxiety and depression issues of client Patient will attend all scheduled client medical appointments in next 30 days Patient will call RNCM or LCSW as needed for CCM support in next 30 days  Interventions: 1:1 collaboration with Kara Dean, [place] regarding development and update of comprehensive plan of care as evidenced by provider attestation and co-signature Discussed client needs with Kara Dean Reviewed with Kara Dean  sleeping challenges of client Reviewed with Kara Dean appetite issues of client. She said she has a reduced appetite Discussed with Kara Dean her walking challenges. She said she uses a cane to help her walk. She said that sometimes when walking she feels unsteady, perhaps a little dizzy. LCSW and client spoke of her Brave Bladder surgery in July of 2022. She said she is recovering well from [place] client related to hearing needs. She said she is having difficulty hearing.  Recommended she talk with Kara Pretty, FNP on next medical appointment with Shelah Lewandowsky to discuss her hearing needs. Reviewed with client challenges with edema . She said she has swelling in her ankles and has to prop up her feet at night to help with swelling issues. Provided counseling support for client Discussed with client [facility] Dual Complete benefits and fact that she may qualify for hearing benefit.  She said she thinks she may need a hearing aid. Reviewed with client her in home support. She said she is residing at home of her daughter. She said that her granddaughter is also supportive and that her granddaughter helps her in the home environment  Patient Self Care Activities:  Takes medications as  prescribed Tries to complete ADLs as able  Patient Coping Strengths:  Has family support Attends scheduled medical appointments Has no transport needs  Patient Self Care Deficits:  Decreased appetite Some mobility issues  Patient Goals:  - spend time or talk with others every day - practice relaxation or meditation daily - keep a calendar with appointment dates  Follow Up Plan:  LCSW to call client or daughter, Jocelyn Lamer, on 10/03/21 at 4:00 PM to assess client needs      Norva Riffle.Josiah Nieto MSW, LCSW Licensed Clinical Social Worker [facility] Care Management [phone]

## 2021-08-13 NOTE — Patient Instructions (Signed)
Visit Information  PATIENT GOALS:  Goals Addressed             This Visit's Progress    Manage Anxiety and Depression issues faced       Timeframe:  Short-Term Goal Priority:  Medium Progress: On Track Start Date:           08/13/21                Expected End Date:   11/09/21  Follow Up Date  10/03/21 at 4:00 PM    Manage My Emotions (Patient)  Manage Anxiety and Depression issues faced    Why is this important?   When you are stressed, down or upset, your body reacts too.  For example, your blood pressure may get higher; you may have a headache or stomachache.  When your emotions get the best of you, your body's ability to fight off cold and flu gets weak.  These steps will help you manage your emotions.    Patient Self Care Activities:  Takes medications as prescribed Tries to complete ADLs as able  Patient Coping Strengths:  Has family support Attends scheduled medical appointments Has no transport needs  Patient Self Care Deficits:  Decreased appetite Some mobility issues  Patient Goals:  - spend time or talk with others every day - practice relaxation or meditation daily - keep a calendar with appointment dates  Follow Up Plan:  LCSW to call client or her daughter, Jocelyn Lamer, on 10/03/21 at 4:00 PM to assess client needs      Norva Riffle.Amberlee Garvey MSW, LCSW Licensed Clinical Social Worker [facility] Care Management [phone]

## 2021-08-30 ENCOUNTER — Other Ambulatory Visit: Payer: Self-pay

## 2021-08-30 ENCOUNTER — Ambulatory Visit (INDEPENDENT_AMBULATORY_CARE_PROVIDER_SITE_OTHER): Payer: Medicare Other | Admitting: Nurse Practitioner

## 2021-08-30 ENCOUNTER — Encounter: Payer: Self-pay | Admitting: Nurse Practitioner

## 2021-08-30 VITALS — BP 142/68 | HR 58 | Temp 98.4°F | Resp 20 | Ht 60.0 in | Wt 117.0 lb

## 2021-08-30 DIAGNOSIS — F3342 Major depressive disorder, recurrent, in full remission: Secondary | ICD-10-CM

## 2021-08-30 DIAGNOSIS — N1831 Chronic kidney disease, stage 3a: Secondary | ICD-10-CM

## 2021-08-30 DIAGNOSIS — I6930 Unspecified sequelae of cerebral infarction: Secondary | ICD-10-CM

## 2021-08-30 DIAGNOSIS — R609 Edema, unspecified: Secondary | ICD-10-CM

## 2021-08-30 DIAGNOSIS — E785 Hyperlipidemia, unspecified: Secondary | ICD-10-CM | POA: Diagnosis not present

## 2021-08-30 DIAGNOSIS — I10 Essential (primary) hypertension: Secondary | ICD-10-CM

## 2021-08-30 DIAGNOSIS — Z794 Long term (current) use of insulin: Secondary | ICD-10-CM

## 2021-08-30 DIAGNOSIS — E119 Type 2 diabetes mellitus without complications: Secondary | ICD-10-CM | POA: Diagnosis not present

## 2021-08-30 DIAGNOSIS — I2510 Atherosclerotic heart disease of native coronary artery without angina pectoris: Secondary | ICD-10-CM | POA: Diagnosis not present

## 2021-08-30 DIAGNOSIS — R7989 Other specified abnormal findings of blood chemistry: Secondary | ICD-10-CM

## 2021-08-30 DIAGNOSIS — K219 Gastro-esophageal reflux disease without esophagitis: Secondary | ICD-10-CM

## 2021-08-30 DIAGNOSIS — R739 Hyperglycemia, unspecified: Secondary | ICD-10-CM | POA: Diagnosis not present

## 2021-08-30 DIAGNOSIS — F411 Generalized anxiety disorder: Secondary | ICD-10-CM

## 2021-08-30 LAB — BAYER DCA HB A1C WAIVED: HB A1C (BAYER DCA - WAIVED): 6.4 % — ABNORMAL HIGH (ref 4.8–5.6)

## 2021-08-30 MED ORDER — METOPROLOL SUCCINATE ER 100 MG PO TB24: ORAL_TABLET | ORAL | 1 refills | Status: DC

## 2021-08-30 MED ORDER — ASPIRIN-DIPYRIDAMOLE ER 25-200 MG PO CP12: ORAL_CAPSULE | ORAL | 1 refills | Status: DC

## 2021-08-30 MED ORDER — AMLODIPINE BESYLATE 10 MG PO TABS
10.0000 mg | ORAL_TABLET | Freq: Every day | ORAL | 1 refills | Status: DC
Start: 2021-08-30 — End: 2021-12-03

## 2021-08-30 MED ORDER — LANTUS SOLOSTAR 100 UNIT/ML ~~LOC~~ SOPN: 26.0000 [IU] | PEN_INJECTOR | Freq: Every day | SUBCUTANEOUS | 5 refills | Status: DC

## 2021-08-30 MED ORDER — OLMESARTAN MEDOXOMIL 40 MG PO TABS
40.0000 mg | ORAL_TABLET | Freq: Every day | ORAL | 1 refills | Status: DC
Start: 2021-08-30 — End: 2021-12-03

## 2021-08-30 MED ORDER — CITALOPRAM HYDROBROMIDE 20 MG PO TABS: 20.0000 mg | ORAL_TABLET | Freq: Every day | ORAL | 1 refills | Status: DC

## 2021-08-30 MED ORDER — POTASSIUM CHLORIDE ER 10 MEQ PO TBCR: 10.0000 meq | EXTENDED_RELEASE_TABLET | ORAL | 1 refills | Status: DC

## 2021-08-30 MED ORDER — LORAZEPAM 0.5 MG PO TABS: 0.5000 mg | ORAL_TABLET | Freq: Two times a day (BID) | ORAL | 5 refills | Status: DC

## 2021-08-30 MED ORDER — ATORVASTATIN CALCIUM 10 MG PO TABS: 10.0000 mg | ORAL_TABLET | Freq: Every day | ORAL | 1 refills | Status: DC

## 2021-08-30 NOTE — Progress Notes (Signed)
Subjective:    Patient ID: Kara Dean, female    DOB: [birth year], 87 y.o.   MRN: [id number]  Chief Complaint: medical management of chronic issues     HPI:  1. Essential hypertension No c/o chest pain, sob or headache. Does not check blood pressure at home. BP Readings from Last 3 Encounters:  08/30/21 (!) 161/69  05/24/21 (!) 127/58  05/12/21 (!) 157/50     2. Late effect of cerebrovascular accident (CVA) No residual effects. Takes aggrenox daily with no side effects noted.  3. Gastroesophageal reflux disease without esophagitis Is on daily dose of protonix. Doe swell for her  4. Chronic kidney disease (CKD) stage G3a/A3, moderately decreased glomerular filtration rate (GFR) between 45-59 mL/min/1.73 square meter and albuminuria creatinine ratio greater than 300 mg/g (HCC) No problems voiding and only mild ext edema Lab Results  Component Value Date   CREATININE 1.08 (H) 05/11/2021     5. Elevated liver function tests Lab Results  Component Value Date   ALT 34 05/10/2021   AST 31 05/10/2021   ALKPHOS 63 05/10/2021   BILITOT 0.9 05/10/2021     6. Recurrent major depressive disorder, in full remission ([place]) Is on celexa daily and is working well for her. Depression screen [facility] 2/9 08/30/2021 08/13/2021 05/31/2021  Decreased Interest 0 1 1  Down, Depressed, Hopeless 0 1 1  PHQ - 2 Score 0 2 2  Altered sleeping - 1 0  Tired, decreased energy - 1 1  Change in appetite - 1 1  Feeling bad or failure about yourself  - 0 1  Trouble concentrating - 1 1  Moving slowly or fidgety/restless - 2 1  Suicidal thoughts - 0 0  PHQ-9 Score - 8 7  Difficult doing work/chores - Somewhat difficult Somewhat difficult  Some recent data might be hidden     7. GAD (generalized anxiety disorder) Takes ativan BID GAD 7 : Generalized Anxiety Score 05/01/2021 01/25/2021 08/21/2020 03/03/2020  Nervous, Anxious, on Edge 1 1 1 1   Control/stop worrying 1 1 2 1   Worry too much -  different things 1 1 2 1   Trouble relaxing 1 1 0 1  Restless 0 0 0 0  Easily annoyed or irritable 0 0 0 0  Afraid - awful might happen 1 0 0 0  Total GAD 7 Score 5 4 5 4   Anxiety Difficulty Not difficult at all Not difficult at all Somewhat difficult Somewhat difficult      8. Peripheral edema Has some daily. Resolves some at night    Outpatient Encounter Medications as of 08/30/2021  Medication Sig   amLODipine (NORVASC) 10 MG tablet Take 1 tablet (10 mg total) by mouth daily.   atorvastatin (LIPITOR) 10 MG tablet Take 1 tablet (10 mg total) by mouth daily. (NEEDS TO BE SEEN BEFORE NEXT REFILL)   B Complex Vitamins (B COMPLEX-B12) TABS Take 1 tablet by mouth daily.   Cholecalciferol (VITAMIN D3 PO) Take 1 capsule daily by mouth.   citalopram (CELEXA) 20 MG tablet Take 1 tablet (20 mg total) by mouth daily. (NEEDS TO BE SEEN BEFORE NEXT REFILL)   dipyridamole-aspirin (AGGRENOX) 200-25 MG 12hr capsule TAKE  (1)  CAPSULE  TWICE DAILY.   fluticasone (FLONASE) 50 MCG/ACT nasal spray USE 2 SPRAYS IN EACH NOSTRIL ONCE DAILY. (Patient taking differently: Place 2 sprays into both nostrils as needed for allergies or rhinitis.)   furosemide (LASIX) 20 MG tablet Take 1 tablet (20 mg total) by mouth  daily.   glucose blood (ONETOUCH ULTRA) test strip Check BS up to 3 times daily Dx E11.9 (Patient taking differently: 1 each by Other route in the morning, at noon, and at bedtime.)   LANTUS SOLOSTAR 100 UNIT/ML Solostar Pen Inject 26 Units into the skin daily.   LORazepam (ATIVAN) 0.5 MG tablet Take 1 tablet (0.5 mg total) by mouth 2 (two) times daily.   Magnesium 250 MG TABS Take 250 mg by mouth daily.   metoprolol succinate (TOPROL-XL) 100 MG 24 hr tablet TAKE 1 TABLET ONCE DAILY WITH A MEAL (Patient taking differently: Take 100 mg by mouth daily.)   olmesartan (BENICAR) 40 MG tablet Take 1 tablet (40 mg total) by mouth daily.   Omega-3 Fatty Acids (FISH OIL) 1000 MG CAPS Take 1 capsule by mouth  daily.   ondansetron (ZOFRAN ODT) 4 MG disintegrating tablet Take 1 tablet (4 mg total) by mouth every 8 (eight) hours as needed for nausea or vomiting.   pantoprazole (PROTONIX) 40 MG tablet Take 1 tablet (40 mg total) by mouth daily. (NEEDS TO BE SEEN BEFORE NEXT REFILL)   potassium chloride (KLOR-CON) 10 MEQ tablet Take 1 tablet (10 mEq total) by mouth as directed. Mon, Wed, and Fri only.   No facility-administered encounter medications on file as of 08/30/2021.    Past Surgical History:  Procedure Laterality Date   ABDOMINAL HYSTERECTOMY     BACK SURGERY  1994   CATARACT EXTRACTION W/PHACO Left 09/19/2017   Procedure: CATARACT EXTRACTION PHACO AND INTRAOCULAR LENS PLACEMENT ([place]);  Surgeon: Baruch Goldmann, MD;  Location: [facility];  Service: Ophthalmology;  Laterality: Left;  CDE: 8.10   CATARACT EXTRACTION W/PHACO Right 10/10/2017   Procedure: CATARACT EXTRACTION PHACO AND INTRAOCULAR LENS PLACEMENT RIGHT EYE;  Surgeon: Baruch Goldmann, MD;  Location: [facility];  Service: Ophthalmology;  Laterality: Right;  CDE: 9.60   CHOLECYSTECTOMY N/A 05/10/2021   Procedure: LAPAROSCOPIC CHOLECYSTECTOMY;  Surgeon: Clovis Riley, MD;  Location: [facility];  Service: General;  Laterality: N/A;   COLON RESECTION  1990's   TUBAL LIGATION     VESICOVAGINAL FISTULA CLOSURE W/ TAH  1984    Family History  Problem Relation Age of Onset   Coronary artery disease Sister        stent x 3   Seizures Sister    Coronary artery disease Brother        stent   Hypertension Mother    Stroke Mother    Emphysema Father    Alcohol abuse Brother    Lung disease Brother    Heart attack Daughter     New complaints: None today  Social history: Lives with youngest daughter  Controlled substance contract: n/a     Review of Systems  Constitutional:  Negative for diaphoresis.  Eyes:  Negative for pain.  Respiratory:  Negative for shortness of breath.   Cardiovascular:  Negative for chest pain, palpitations and  leg swelling.  Gastrointestinal:  Negative for abdominal pain.  Endocrine: Negative for polydipsia.  Skin:  Negative for rash.  Neurological:  Negative for dizziness, weakness and headaches.  Hematological:  Does not bruise/bleed easily.  All other systems reviewed and are negative.     Objective:   Physical Exam Vitals and nursing note reviewed.  Constitutional:      General: She is not in acute distress.    Appearance: Normal appearance. She is well-developed.  HENT:     Head: Normocephalic.     Right Ear: Tympanic membrane normal.  Left Ear: Tympanic membrane normal.     Nose: Nose normal.     Mouth/Throat:     Mouth: Mucous membranes are moist.  Eyes:     Pupils: Pupils are equal, round, and reactive to light.  Neck:     Vascular: No carotid bruit or JVD.  Cardiovascular:     Rate and Rhythm: Normal rate and regular rhythm.     Heart sounds: Normal heart sounds.  Pulmonary:     Effort: Pulmonary effort is normal. No respiratory distress.     Breath sounds: Normal breath sounds. No wheezing or rales.  Chest:     Chest wall: No tenderness.  Abdominal:     General: Bowel sounds are normal. There is no distension or abdominal bruit.     Palpations: Abdomen is soft. There is no hepatomegaly, splenomegaly, mass or pulsatile mass.     Tenderness: There is no abdominal tenderness.  Musculoskeletal:        General: Normal range of motion.     Cervical back: Normal range of motion and neck supple.  Lymphadenopathy:     Cervical: No cervical adenopathy.  Skin:    General: Skin is warm and dry.  Neurological:     Mental Status: She is alert and oriented to person, place, and time.     Deep Tendon Reflexes: Reflexes are normal and symmetric.  Psychiatric:        Behavior: Behavior normal.        Thought Content: Thought content normal.        Judgment: Judgment normal.     BP (!) 142/68   Pulse (!) 58   Temp 98.4 F (36.9 C) (Temporal)   Resp 20   Ht 5' (1.524 m)    Wt 117 lb (53.1 kg)   HC 20" (50.8 cm)   SpO2 96%   BMI 22.85 kg/m    Hgba1c 6.4     Assessment & Plan:  CHELISA HENNEN comes in today with chief complaint of Medical Management of Chronic Issues   Diagnosis and orders addressed:  1. Essential hypertension Low sodium diet - CBC with Differential/Platelet - CMP14+EGFR - olmesartan (BENICAR) 40 MG tablet; Take 1 tablet (40 mg total) by mouth daily.  Dispense: 90 tablet; Refill: 1 - amLODipine (NORVASC) 10 MG tablet; Take 1 tablet (10 mg total) by mouth daily.  Dispense: 90 tablet; Refill: 1 - potassium chloride (KLOR-CON) 10 MEQ tablet; Take 1 tablet (10 mEq total) by mouth as directed. Mon, Wed, and Fri only.  Dispense: 90 tablet; Refill: 1  2. Late effect of cerebrovascular accident (CVA)  3. Gastroesophageal reflux disease without esophagitis Avoid spicy foods Do not eat 2 hours prior to bedtime  4. Chronic kidney disease (CKD) stage G3a/A3, moderately decreased glomerular filtration rate (GFR) between 45-59 mL/min/1.73 square meter and albuminuria creatinine ratio greater than 300 mg/g (HCC) Labs pending  5. Elevated liver function tests Labs pending  6. Recurrent major depressive disorder, in full remission (HCC) Stress maanement - citalopram (CELEXA) 20 MG tablet; Take 1 tablet (20 mg total) by mouth daily. (NEEDS TO BE SEEN BEFORE NEXT REFILL)  Dispense: 90 tablet; Refill: 1  7. GAD (generalized anxiety disorder) - LORazepam (ATIVAN) 0.5 MG tablet; Take 1 tablet (0.5 mg total) by mouth 2 (two) times daily.  Dispense: 60 tablet; Refill: 5  8. Peripheral edema Elevate legs when sitting  9. Type 2 diabetes mellitus treated with insulin (HCC) Continue to watch carbisn diet  10. Hyperlipidemia,  unspecified hyperlipidemia type Low fat diet - Bayer DCA Hb A1c Waived - Lipid panel - atorvastatin (LIPITOR) 10 MG tablet; Take 1 tablet (10 mg total) by mouth daily. (NEEDS TO BE SEEN BEFORE NEXT REFILL)  Dispense: 90  tablet; Refill: 1  11. Coronary artery disease involving native coronary artery of native heart without angina pectoris - dipyridamole-aspirin (AGGRENOX) 200-25 MG 12hr capsule; TAKE  (1)  CAPSULE  TWICE DAILY.  Dispense: 180 capsule; Refill: 1 - metoprolol succinate (TOPROL-XL) 100 MG 24 hr tablet; TAKE 1 TABLET ONCE DAILY WITH A MEAL  Dispense: 90 tablet; Refill: 1   Labs pending Health Maintenance reviewed Diet and exercise encouraged  Follow up plan: 3 months   [name], FNP

## 2021-08-30 NOTE — Patient Instructions (Signed)

## 2021-08-31 LAB — CBC WITH DIFFERENTIAL/PLATELET
Basophils Absolute: 0 10*3/uL (ref 0.0–0.2)
Basos: 1 %
EOS (ABSOLUTE): 0.3 10*3/uL (ref 0.0–0.4)
Eos: 4 %
Hematocrit: 40.1 % (ref 34.0–46.6)
Hemoglobin: 13.6 g/dL (ref 11.1–15.9)
Immature Grans (Abs): 0 10*3/uL (ref 0.0–0.1)
Immature Granulocytes: 0 %
Lymphocytes Absolute: 1 10*3/uL (ref 0.7–3.1)
Lymphs: 14 %
MCH: 30.4 pg (ref 26.6–33.0)
MCHC: 33.9 g/dL (ref 31.5–35.7)
MCV: 90 fL (ref 79–97)
Monocytes Absolute: 0.6 10*3/uL (ref 0.1–0.9)
Monocytes: 8 %
Neutrophils Absolute: 5.3 10*3/uL (ref 1.4–7.0)
Neutrophils: 73 %
Platelets: 182 10*3/uL (ref 150–450)
RBC: 4.47 x10E6/uL (ref 3.77–5.28)
RDW: 13.3 % (ref 11.7–15.4)
WBC: 7.3 10*3/uL (ref 3.4–10.8)

## 2021-08-31 LAB — LIPID PANEL
Chol/HDL Ratio: 4.8 ratio — ABNORMAL HIGH (ref 0.0–4.4)
Cholesterol, Total: 178 mg/dL (ref 100–199)
HDL: 37 mg/dL — ABNORMAL LOW (ref >39)
LDL Chol Calc (NIH): 82 mg/dL (ref 0–99)
Triglycerides: 360 mg/dL — ABNORMAL HIGH (ref 0–149)
VLDL Cholesterol Cal: 59 mg/dL — ABNORMAL HIGH (ref 5–40)

## 2021-08-31 LAB — CMP14+EGFR
ALT: 7 IU/L (ref 0–32)
AST: 15 IU/L (ref 0–40)
Albumin/Globulin Ratio: 1.6 (ref 1.2–2.2)
Albumin: 4.4 g/dL (ref 3.6–4.6)
Alkaline Phosphatase: 80 IU/L (ref 44–121)
BUN/Creatinine Ratio: 11 — ABNORMAL LOW (ref 12–28)
BUN: 15 mg/dL (ref 8–27)
Bilirubin Total: 0.5 mg/dL (ref 0.0–1.2)
CO2: 23 mmol/L (ref 20–29)
Calcium: 10 mg/dL (ref 8.7–10.3)
Chloride: 104 mmol/L (ref 96–106)
Creatinine, Ser: 1.36 mg/dL — ABNORMAL HIGH (ref 0.57–1.00)
Globulin, Total: 2.8 g/dL (ref 1.5–4.5)
Glucose: 141 mg/dL — ABNORMAL HIGH (ref 70–99)
Potassium: 4.6 mmol/L (ref 3.5–5.2)
Sodium: 145 mmol/L — ABNORMAL HIGH (ref 134–144)
Total Protein: 7.2 g/dL (ref 6.0–8.5)
eGFR: 38 mL/min/{1.73_m2} — ABNORMAL LOW (ref >59)

## 2021-09-03 DIAGNOSIS — N1831 Chronic kidney disease, stage 3a: Secondary | ICD-10-CM

## 2021-09-03 DIAGNOSIS — I10 Essential (primary) hypertension: Secondary | ICD-10-CM | POA: Diagnosis not present

## 2021-09-03 DIAGNOSIS — E785 Hyperlipidemia, unspecified: Secondary | ICD-10-CM

## 2021-09-03 DIAGNOSIS — F3342 Major depressive disorder, recurrent, in full remission: Secondary | ICD-10-CM

## 2021-09-03 DIAGNOSIS — E119 Type 2 diabetes mellitus without complications: Secondary | ICD-10-CM

## 2021-09-03 DIAGNOSIS — Z794 Long term (current) use of insulin: Secondary | ICD-10-CM

## 2021-10-03 ENCOUNTER — Telehealth: Payer: Medicare Other

## 2021-10-16 ENCOUNTER — Telehealth: Payer: Medicare Other

## 2021-11-01 ENCOUNTER — Telehealth: Payer: Self-pay | Admitting: Nurse Practitioner

## 2021-11-01 NOTE — Telephone Encounter (Signed)
Informed pt to try Robitussin, Mucinex- plain since she has high BP. Rotate Tylenol/IBU, increase water intake until she is seen. Pt understood.

## 2021-11-02 ENCOUNTER — Ambulatory Visit (INDEPENDENT_AMBULATORY_CARE_PROVIDER_SITE_OTHER): Payer: Medicare Other | Admitting: Family Medicine

## 2021-11-02 ENCOUNTER — Encounter: Payer: Self-pay | Admitting: Family Medicine

## 2021-11-02 VITALS — BP 142/88 | HR 67 | Temp 98.7°F | Ht 60.0 in | Wt 116.0 lb

## 2021-11-02 DIAGNOSIS — J069 Acute upper respiratory infection, unspecified: Secondary | ICD-10-CM | POA: Diagnosis not present

## 2021-11-02 LAB — VERITOR FLU A/B WAIVED
Influenza A: NEGATIVE
Influenza B: NEGATIVE

## 2021-11-02 MED ORDER — METHYLPREDNISOLONE ACETATE 40 MG/ML IJ SUSP
40.0000 mg | Freq: Once | INTRAMUSCULAR | Status: AC
Start: 2021-11-02 — End: 2021-11-02
  Administered 2021-11-02: 10:00:00 40 mg via INTRAMUSCULAR

## 2021-11-02 MED ORDER — AZITHROMYCIN 250 MG PO TABS: ORAL_TABLET | ORAL | 0 refills | Status: DC

## 2021-11-02 MED ORDER — GUAIFENESIN ER 600 MG PO TB12: 600.0000 mg | ORAL_TABLET | Freq: Two times a day (BID) | ORAL | 0 refills | Status: AC

## 2021-11-02 NOTE — Patient Instructions (Signed)
It appears that you have a viral upper respiratory infection (cold).  Cold symptoms can last up to 2 weeks.    - Get plenty of rest and drink plenty of fluids. - Try to breathe moist air. Use a cold mist humidifier. - Consume warm fluids (soup or tea) to provide relief for a stuffy nose and to loosen phlegm. - For cough and congestion you can use plain Mucinex, regular or max strength, follow box directions.  - For nasal stuffiness, try saline nasal spray or a Neti Pot. You can use saline nasal spray 4 times daily. Do not use tap water in the [facility], follow instructions on box for proper use. Afrin nasal spray can also be used but this product should not be used longer than 3 days or it will cause rebound nasal stuffiness (worsening nasal congestion). - For sore throat pain relief: use chloraseptic spray, suck on throat lozenges, hard candy or popsicles; gargle with warm salt water (1/4 tsp. salt per 8 oz. of water); and eat soft, bland foods. - For fever or aches and pains take tylenol or motrin as appropriate for age and weight.  - Eat a well-balanced diet. If you cannot, ensure you are getting enough nutrients by taking a daily multivitamin. - Avoid dairy products, as they can thicken phlegm. - Avoid alcohol, as it impairs your bodys immune system. - Change your toothbrush in 3 days.   CONTACT YOUR DOCTOR IF YOU EXPERIENCE ANY OF THE FOLLOWING: - High fever - Ear pain - Sinus-type headache - Unusually severe cold symptoms - Cough that gets worse while other cold symptoms improve - Flare up of any chronic lung problem, such as asthma or COPD - Your symptoms persist longer than 2 weeks

## 2021-11-02 NOTE — Progress Notes (Signed)
Subjective:  Patient ID: Kara Dean, female    DOB: Aug 14, 1933, 87 y.o.   MRN: [id number]  Patient Care Team: [name], [place] as PCP - General (Nurse Practitioner) Sherren Mocha, MD as PCP - Cardiology (Cardiology) Baruch Goldmann, MD as Consulting Physician (Ophthalmology) Melina Schools, OD (Optometry) Sheffield, Ronalee Red, PA-C as Physician Assistant (Dermatology) Ilean China, RN as Registered Nurse Shea Evans, Norva Riffle, LCSW as Social Worker (Licensed Clinical Social Worker) Kathlen Mody, Susy Frizzle as Physician Assistant (Cardiology)   Chief Complaint:  Cough (Cough, Congestion x3 days)   HPI: Kara Dean is a 87 y.o. female presenting on 11/02/2021 for Cough (Cough, Congestion x3 days)   Pt presents today with complaints of cough, congestion, postnasal drainage, and malaise. Onset 3 days ago, symptoms seem to be worsening, not improving. She has not tried anything for symptoms. States everyone in the home has been sick, unknown cause as they have not been seen, their symptoms have resolved.   Cough This is a new problem. The current episode started in the past 7 days. The problem has been waxing and waning. The problem occurs every few minutes. The cough is Non-productive. Associated symptoms include headaches, nasal congestion, postnasal drip, rhinorrhea and wheezing. Pertinent negatives include no chest pain, chills, ear congestion, ear pain, fever, heartburn, hemoptysis, myalgias, rash, sore throat, shortness of breath, sweats or weight loss. She has tried nothing for the symptoms.  Relevant past medical, surgical, family, and social history reviewed and updated as indicated.  Allergies and medications reviewed and updated. Data reviewed: Chart in Epic.   Past Medical History:  Diagnosis Date   Anxiety    Carotid artery disease ([place])    Carotid US 11/2018:  bilateral ICA 1-39 >> repeat 2 years.    Cataract    Crohn's colitis ([place])    CVA (cerebral  infarction)    No deficits   Depression    Diabetes mellitus    type 2   Dyslipidemia    GERD (gastroesophageal reflux disease)    History of TIAs    no deficits   Hyperlipidemia    Hypertension    Hyponatremia    Nodular basal cell carcinoma (BCC) 04/22/2018   left prox, nasal root (tx p bx)   Osteoarthritis    SCC (squamous cell carcinoma) 04/22/2018   left shin,superior-(tx p bx), Left shin,inferior-(tx p bx )    Past Surgical History:  Procedure Laterality Date   ABDOMINAL HYSTERECTOMY     BACK SURGERY  1994   CATARACT EXTRACTION W/PHACO Left 09/19/2017   Procedure: CATARACT EXTRACTION PHACO AND INTRAOCULAR LENS PLACEMENT ([place]);  Surgeon: Baruch Goldmann, MD;  Location: [facility];  Service: Ophthalmology;  Laterality: Left;  CDE: 8.10   CATARACT EXTRACTION W/PHACO Right 10/10/2017   Procedure: CATARACT EXTRACTION PHACO AND INTRAOCULAR LENS PLACEMENT RIGHT EYE;  Surgeon: Baruch Goldmann, MD;  Location: [facility];  Service: Ophthalmology;  Laterality: Right;  CDE: 9.60   CHOLECYSTECTOMY N/A 05/10/2021   Procedure: LAPAROSCOPIC CHOLECYSTECTOMY;  Surgeon: Clovis Riley, MD;  Location: [facility];  Service: General;  Laterality: N/A;   COLON RESECTION  1990's   TUBAL LIGATION     VESICOVAGINAL FISTULA CLOSURE W/ TAH  1984    Social History   Socioeconomic History   Marital status: Widowed    Spouse name: Not on file   Number of children: 6   Years of education: 10   Highest education level: 10th grade  Occupational History  Occupation: Retired    Comment: [employer]  Tobacco Use   Smoking status: Never   Smokeless tobacco: Never  Vaping Use   Vaping Use: Never used  Substance and Sexual Activity   Alcohol use: No   Drug use: No   Sexual activity: Not Currently  Other Topics Concern   Not on file  Social History Narrative   Kara Dean is retired and widowed. She lives at home and her granddaughter lives with her. She lives within walking distance to her 2 daughter's houses.  Has an indoor cat. Her sister moved to [place] and she doesn't get to get to visit with her often but does talk with her on the phone. She has 6 children and many grandchildren and great grandchildren.    Social Determinants of Health   Financial Resource Strain: Low Risk    Difficulty of Paying Living Expenses: Not hard at all  Food Insecurity: No Food Insecurity   Worried About Charity fundraiser in the Last Year: Never true   Arboriculturist in the Last Year: Never true  Transportation Needs: Unmet Transportation Needs   Lack of Transportation (Medical): Yes   Lack of Transportation (Non-Medical): No  Physical Activity: Inactive   Days of Exercise per Week: 0 days   Minutes of Exercise per Session: 0 min  Stress: Stress Concern Present   Feeling of Stress : To some extent  Social Connections: Socially Isolated   Frequency of Communication with Friends and Family: More than three times a week   Frequency of Social Gatherings with Friends and Family: Once a week   Attends Religious Services: Never   Marine scientist or Organizations: No   Attends Archivist Meetings: Never   Marital Status: Widowed  Intimate Partner Violence: Not At Risk   Fear of Current or Ex-Partner: No   Emotionally Abused: No   Physically Abused: No   Sexually Abused: No    Outpatient Encounter Medications as of 11/02/2021  Medication Sig   atorvastatin (LIPITOR) 10 MG tablet Take 1 tablet (10 mg total) by mouth daily. (NEEDS TO BE SEEN BEFORE NEXT REFILL)   azithromycin (ZITHROMAX Z-PAK) 250 MG tablet As directed   B Complex Vitamins (B COMPLEX-B12) TABS Take 1 tablet by mouth daily.   Cholecalciferol (VITAMIN D3 PO) Take 1 capsule by mouth daily.   citalopram (CELEXA) 20 MG tablet Take 1 tablet (20 mg total) by mouth daily. (NEEDS TO BE SEEN BEFORE NEXT REFILL)   dipyridamole-aspirin (AGGRENOX) 200-25 MG 12hr capsule Take by mouth.   fluticasone (FLONASE) 50 MCG/ACT nasal spray USE 2  SPRAYS IN EACH NOSTRIL ONCE DAILY.   furosemide (LASIX) 20 MG tablet Take 1 tablet (20 mg total) by mouth daily.   glucose blood (ONETOUCH ULTRA) test strip Check BS up to 3 times daily Dx E11.9   guaiFENesin (MUCINEX) 600 MG 12 hr tablet Take 1 tablet (600 mg total) by mouth 2 (two) times daily for 10 days.   LANTUS SOLOSTAR 100 UNIT/ML Solostar Pen Inject 26 Units into the skin daily.   LORazepam (ATIVAN) 0.5 MG tablet Take 1 tablet (0.5 mg total) by mouth 2 (two) times daily.   Magnesium 250 MG TABS Take 250 mg by mouth daily.   metoprolol succinate (TOPROL-XL) 100 MG 24 hr tablet TAKE 1 TABLET ONCE DAILY WITH A MEAL   Omega-3 Fatty Acids (FISH OIL) 1000 MG CAPS Take 1 capsule by mouth daily.   pantoprazole (PROTONIX) 40 MG  tablet Take 1 tablet (40 mg total) by mouth daily. (NEEDS TO BE SEEN BEFORE NEXT REFILL)   potassium chloride (KLOR-CON) 10 MEQ tablet Take 1 tablet (10 mEq total) by mouth as directed. Mon, Wed, and Fri only.   [DISCONTINUED] dipyridamole-aspirin (AGGRENOX) 200-25 MG 12hr capsule TAKE  (1)  CAPSULE  TWICE DAILY.   [DISCONTINUED] fluticasone (FLONASE) 50 MCG/ACT nasal spray Place into the nose.   amLODipine (NORVASC) 10 MG tablet Take 1 tablet (10 mg total) by mouth daily. (Patient not taking: Reported on 11/02/2021)   olmesartan (BENICAR) 40 MG tablet Take 1 tablet (40 mg total) by mouth daily. (Patient not taking: Reported on 11/02/2021)   [DISCONTINUED] ondansetron (ZOFRAN ODT) 4 MG disintegrating tablet Take 1 tablet (4 mg total) by mouth every 8 (eight) hours as needed for nausea or vomiting. (Patient not taking: Reported on 11/02/2021)   [EXPIRED] methylPREDNISolone acetate (DEPO-MEDROL) injection 40 mg    No facility-administered encounter medications on file as of 11/02/2021.    Allergies  Allergen Reactions   Codeine Other (See Comments)    unknown   Latex Rash   Morphine Other (See Comments)    unknown   Niacin Other (See Comments)    unknown   Sulfa  Antibiotics Rash   Sulfonamide Derivatives Rash    Review of Systems  Constitutional:  Positive for activity change, appetite change and fatigue. Negative for chills, diaphoresis, fever, unexpected weight change and weight loss.  HENT:  Positive for congestion, postnasal drip and rhinorrhea. Negative for ear pain, sinus pressure, sinus pain, sneezing, sore throat, tinnitus and voice change.   Respiratory:  Positive for cough and wheezing. Negative for hemoptysis and shortness of breath.   Cardiovascular:  Negative for chest pain and leg swelling.  Gastrointestinal:  Negative for abdominal pain and heartburn.  Genitourinary:  Negative for decreased urine volume.  Musculoskeletal:  Negative for arthralgias and myalgias.  Skin:  Negative for rash.  Neurological:  Positive for headaches. Negative for dizziness, tremors, seizures, syncope, facial asymmetry, speech difficulty, weakness, light-headedness and numbness.  Psychiatric/Behavioral:  Negative for confusion.   All other systems reviewed and are negative.      Objective:  BP (!) 142/88    Pulse 67    Temp 98.7 F (37.1 C)    Ht 5' (1.524 m)    Wt 116 lb (52.6 kg)    SpO2 93%    BMI 22.65 kg/m    Wt Readings from Last 3 Encounters:  11/02/21 116 lb (52.6 kg)  08/30/21 117 lb (53.1 kg)  05/24/21 117 lb (53.1 kg)    Physical Exam Vitals and nursing note reviewed.  Constitutional:      General: She is not in acute distress.    Appearance: Normal appearance. She is normal weight. She is not ill-appearing, toxic-appearing or diaphoretic.  HENT:     Head: Normocephalic and atraumatic.     Right Ear: Tympanic membrane, ear canal and external ear normal.     Left Ear: Tympanic membrane, ear canal and external ear normal.     Nose: Congestion present.     Mouth/Throat:     Mouth: Mucous membranes are moist.     Pharynx: Posterior oropharyngeal erythema present. No pharyngeal swelling, oropharyngeal exudate or uvula swelling.      Tonsils: No tonsillar exudate or tonsillar abscesses.  Eyes:     Conjunctiva/sclera: Conjunctivae normal.     Pupils: Pupils are equal, round, and reactive to light.  Cardiovascular:  Rate and Rhythm: Normal rate and regular rhythm.     Heart sounds: Normal heart sounds.  Pulmonary:     Effort: Pulmonary effort is normal. No respiratory distress.     Breath sounds: No stridor. Wheezing (minimal, expiratory, scattered) present. No rhonchi or rales.  Chest:     Chest wall: No tenderness.  Musculoskeletal:     Cervical back: Normal range of motion and neck supple.     Right lower leg: No edema.     Left lower leg: No edema.  Lymphadenopathy:     Cervical: No cervical adenopathy.  Skin:    General: Skin is warm and dry.     Capillary Refill: Capillary refill takes less than 2 seconds.  Neurological:     General: No focal deficit present.     Mental Status: She is alert and oriented to person, place, and time.  Psychiatric:        Mood and Affect: Mood normal.        Behavior: Behavior normal.        Thought Content: Thought content normal.        Judgment: Judgment normal.    Results for orders placed or performed in visit on 08/30/21  Bayer DCA Hb A1c Waived  Result Value Ref Range   HB A1C (BAYER DCA - WAIVED) 6.4 (H) 4.8 - 5.6 %  CBC with Differential/Platelet  Result Value Ref Range   WBC 7.3 3.4 - 10.8 x10E3/uL   RBC 4.47 3.77 - 5.28 x10E6/uL   Hemoglobin 13.6 11.1 - 15.9 g/dL   Hematocrit 40.1 34.0 - 46.6 %   MCV 90 79 - 97 fL   MCH 30.4 26.6 - 33.0 pg   MCHC 33.9 31.5 - 35.7 g/dL   RDW 13.3 11.7 - 15.4 %   Platelets 182 150 - 450 x10E3/uL   Neutrophils 73 Not Estab. %   Lymphs 14 Not Estab. %   Monocytes 8 Not Estab. %   Eos 4 Not Estab. %   Basos 1 Not Estab. %   Neutrophils Absolute 5.3 1.4 - 7.0 x10E3/uL   Lymphocytes Absolute 1.0 0.7 - 3.1 x10E3/uL   Monocytes Absolute 0.6 0.1 - 0.9 x10E3/uL   EOS (ABSOLUTE) 0.3 0.0 - 0.4 x10E3/uL   Basophils Absolute  0.0 0.0 - 0.2 x10E3/uL   Immature Granulocytes 0 Not Estab. %   Immature Grans (Abs) 0.0 0.0 - 0.1 x10E3/uL  CMP14+EGFR  Result Value Ref Range   Glucose 141 (H) 70 - 99 mg/dL   BUN 15 8 - 27 mg/dL   Creatinine, Ser 1.36 (H) 0.57 - 1.00 mg/dL   eGFR 38 (L) >59 mL/min/1.73   BUN/Creatinine Ratio 11 (L) 12 - 28   Sodium 145 (H) 134 - 144 mmol/L   Potassium 4.6 3.5 - 5.2 mmol/L   Chloride 104 96 - 106 mmol/L   CO2 23 20 - 29 mmol/L   Calcium 10.0 8.7 - 10.3 mg/dL   Total Protein 7.2 6.0 - 8.5 g/dL   Albumin 4.4 3.6 - 4.6 g/dL   Globulin, Total 2.8 1.5 - 4.5 g/dL   Albumin/Globulin Ratio 1.6 1.2 - 2.2   Bilirubin Total 0.5 0.0 - 1.2 mg/dL   Alkaline Phosphatase 80 44 - 121 IU/L   AST 15 0 - 40 IU/L   ALT 7 0 - 32 IU/L  Lipid panel  Result Value Ref Range   Cholesterol, Total 178 100 - 199 mg/dL   Triglycerides 360 (H) 0 - 149  mg/dL   HDL 37 (L) >39 mg/dL   VLDL Cholesterol Cal 59 (H) 5 - 40 mg/dL   LDL Chol Calc (NIH) 82 0 - 99 mg/dL   Chol/HDL Ratio 4.8 (H) 0.0 - 4.4 ratio       Pertinent labs & imaging results that were available during my care of the patient were reviewed by me and considered in my medical decision making.  Assessment & Plan:  Kara Dean was seen today for cough.  Diagnoses and all orders for this visit:  URI with cough and congestion Influenza negative in office. COVID pending, will initiate antiviral therapy if warranted. Scattered wheezing with URI symptoms. Will place on azithromycin as prescribed and burst with steroids in office. Symptomatic care at home discussed in detail. Report any new, worsening, or persistent symptoms. Follow up in 2 weeks for reevaluation.  -     Veritor Flu A/B Waived -     Novel Coronavirus, NAA ([facility]) -     azithromycin (ZITHROMAX Z-PAK) 250 MG tablet; As directed -     guaiFENesin (MUCINEX) 600 MG 12 hr tablet; Take 1 tablet (600 mg total) by mouth 2 (two) times daily for 10 days. -     methylPREDNISolone acetate  (DEPO-MEDROL) injection 40 mg     Continue all other maintenance medications.  Follow up plan: Return in about 2 weeks (around 11/16/2021), or if symptoms worsen or fail to improve, for URI.   Continue healthy lifestyle choices, including diet (rich in fruits, vegetables, and lean proteins, and low in salt and simple carbohydrates) and exercise (at least 30 minutes of moderate physical activity daily).  Educational handout given for URI  The above assessment and management plan was discussed with the patient. The patient verbalized understanding of and has agreed to the management plan. Patient is aware to call the clinic if they develop any new symptoms or if symptoms persist or worsen. Patient is aware when to return to the clinic for a follow-up visit. Patient educated on when it is appropriate to go to the emergency department.   Monia Pouch, FNP-C [facility] [phone]

## 2021-11-03 LAB — NOVEL CORONAVIRUS, NAA: SARS-CoV-2, NAA: NOT DETECTED

## 2021-11-03 LAB — SARS-COV-2, NAA 2 DAY TAT

## 2021-11-15 DIAGNOSIS — L84 Corns and callosities: Secondary | ICD-10-CM | POA: Diagnosis not present

## 2021-11-15 DIAGNOSIS — E1142 Type 2 diabetes mellitus with diabetic polyneuropathy: Secondary | ICD-10-CM | POA: Diagnosis not present

## 2021-11-15 DIAGNOSIS — B351 Tinea unguium: Secondary | ICD-10-CM | POA: Diagnosis not present

## 2021-11-15 DIAGNOSIS — M79676 Pain in unspecified toe(s): Secondary | ICD-10-CM | POA: Diagnosis not present

## 2021-12-03 ENCOUNTER — Encounter: Payer: Self-pay | Admitting: Nurse Practitioner

## 2021-12-03 ENCOUNTER — Ambulatory Visit (INDEPENDENT_AMBULATORY_CARE_PROVIDER_SITE_OTHER): Payer: Medicare Other | Admitting: Nurse Practitioner

## 2021-12-03 VITALS — BP 158/82 | HR 66 | Temp 97.8°F | Resp 20 | Ht 61.0 in | Wt 112.0 lb

## 2021-12-03 DIAGNOSIS — N1831 Chronic kidney disease, stage 3a: Secondary | ICD-10-CM

## 2021-12-03 DIAGNOSIS — I10 Essential (primary) hypertension: Secondary | ICD-10-CM | POA: Diagnosis not present

## 2021-12-03 DIAGNOSIS — R609 Edema, unspecified: Secondary | ICD-10-CM

## 2021-12-03 DIAGNOSIS — R001 Bradycardia, unspecified: Secondary | ICD-10-CM | POA: Diagnosis not present

## 2021-12-03 DIAGNOSIS — F411 Generalized anxiety disorder: Secondary | ICD-10-CM

## 2021-12-03 DIAGNOSIS — Z794 Long term (current) use of insulin: Secondary | ICD-10-CM

## 2021-12-03 DIAGNOSIS — E785 Hyperlipidemia, unspecified: Secondary | ICD-10-CM | POA: Diagnosis not present

## 2021-12-03 DIAGNOSIS — K219 Gastro-esophageal reflux disease without esophagitis: Secondary | ICD-10-CM | POA: Diagnosis not present

## 2021-12-03 DIAGNOSIS — I2510 Atherosclerotic heart disease of native coronary artery without angina pectoris: Secondary | ICD-10-CM

## 2021-12-03 DIAGNOSIS — I6930 Unspecified sequelae of cerebral infarction: Secondary | ICD-10-CM | POA: Diagnosis not present

## 2021-12-03 DIAGNOSIS — F3342 Major depressive disorder, recurrent, in full remission: Secondary | ICD-10-CM

## 2021-12-03 DIAGNOSIS — E119 Type 2 diabetes mellitus without complications: Secondary | ICD-10-CM | POA: Diagnosis not present

## 2021-12-03 LAB — BAYER DCA HB A1C WAIVED: HB A1C (BAYER DCA - WAIVED): 7 % — ABNORMAL HIGH (ref 4.8–5.6)

## 2021-12-03 MED ORDER — ATORVASTATIN CALCIUM 10 MG PO TABS: 10.0000 mg | ORAL_TABLET | Freq: Every day | ORAL | 1 refills | Status: DC

## 2021-12-03 MED ORDER — OLMESARTAN MEDOXOMIL 40 MG PO TABS: 40.0000 mg | ORAL_TABLET | Freq: Every day | ORAL | 1 refills | Status: DC

## 2021-12-03 MED ORDER — POTASSIUM CHLORIDE ER 10 MEQ PO TBCR: 10.0000 meq | EXTENDED_RELEASE_TABLET | ORAL | 1 refills | Status: DC

## 2021-12-03 MED ORDER — FUROSEMIDE 20 MG PO TABS: 20.0000 mg | ORAL_TABLET | Freq: Every day | ORAL | 1 refills | Status: DC

## 2021-12-03 MED ORDER — PANTOPRAZOLE SODIUM 40 MG PO TBEC: 40.0000 mg | DELAYED_RELEASE_TABLET | Freq: Every day | ORAL | 1 refills | Status: DC

## 2021-12-03 MED ORDER — METOPROLOL SUCCINATE ER 50 MG PO TB24: 50.0000 mg | ORAL_TABLET | Freq: Every day | ORAL | 1 refills | Status: DC

## 2021-12-03 MED ORDER — AMLODIPINE BESYLATE 10 MG PO TABS: 10.0000 mg | ORAL_TABLET | Freq: Every day | ORAL | 1 refills | Status: DC

## 2021-12-03 NOTE — Progress Notes (Signed)
Subjective:    Patient ID: Kara Dean, female    DOB: 1932/11/27, 88 y.o.   MRN: [id number]  Chief Complaint: Medical Management of Chronic Issues    HPI:  Kara Dean is a 88 y.o. who identifies as a female who was assigned female at birth.   Social history: Lives with: lives with her daughter Work history: retired   Scientist, forensic in today for follow up of the following chronic medical issues:  1. Coronary artery disease involving native coronary artery of native heart without angina pectoris Saw cardiology on 06/20/21. According to office note reviewed- no changes were made to plan of care. Carotid doppler duplex study showed mild plaque.  2. Essential hypertension No c/o chest pain, sob or headache. Does check blood pressure at home daily. It is high some days and low other days. Her pulse is is running in the 40's. Feels dizzy a lot. When she saw cardiology it was in the r60's  3. Type 2 diabetes mellitus treated with insulin ([place]) She checks her blood sugar daily . Her blood sugars are running from 88-190. Sh ehas been increasing insulin on her own based on her blood sugar. She is suppose to be on 28 u a day. She would increase it if below 1150 but was >200 one day and she decreased her units.  Lab Results  Component Value Date   HGBA1C 6.4 (H) 08/30/2021     4. Hyperlipidemia, unspecified hyperlipidemia type Doe snot watch diet and does very little exercise. Lab Results  Component Value Date   CHOL 178 08/30/2021   HDL 37 (L) 08/30/2021   LDLCALC 82 08/30/2021   TRIG 360 (H) 08/30/2021   CHOLHDL 4.8 (H) 08/30/2021     5. Gastroesophageal reflux disease without esophagitis Is on protinix daily and that works well for her.  6. Chronic kidney disease (CKD) stage G3a/A3, moderately decreased glomerular filtration rate (GFR) between 45-59 mL/min/1.73 square meter and albuminuria creatinine ratio greater than 300 mg/g (HCC) Lab Results  Component Value Date    CREATININE 1.36 (H) 08/30/2021     7. Peripheral edema Has some edema daily  8. Recurrent major depressive disorder, in full remission [facility]) Is on celexa an dis doing well Depression screen [facility] 2/9 12/03/2021 11/02/2021 08/30/2021  Decreased Interest 1 2 0  Down, Depressed, Hopeless 1 2 0  PHQ - 2 Score 2 4 0  Altered sleeping 1 0 -  Tired, decreased energy 2 2 -  Change in appetite 2 3 -  Feeling bad or failure about yourself  0 1 -  Trouble concentrating 0 0 -  Moving slowly or fidgety/restless 0 0 -  Suicidal thoughts 0 0 -  PHQ-9 Score 7 10 -  Difficult doing work/chores Not difficult at all Somewhat difficult -  Some recent data might be hidden     9. GAD (generalized anxiety disorder) Is on ativan BID. Has been on ativan for years but doe sot always take 2x a day. GAD 7 : Generalized Anxiety Score 12/03/2021 11/02/2021 05/01/2021 01/25/2021  Nervous, Anxious, on Edge _0 Control/stop worrying _1 Worry too much - different things _2 Trouble relaxing _3 Restless 0 0 0 0  Easily annoyed or irritable 1 2 0 0  Afraid - awful might happen 0 1 1 0  Total GAD 7 Score _4 Anxiety Difficulty Not difficult at all  Somewhat difficult Not difficult at all Not difficult at all      10. Late effect of cerebrovascular accident (CVA) No permanent side effects.   New complaints: None today  Allergies  Allergen Reactions   Codeine Other (See Comments)    unknown   Latex Rash   Morphine Other (See Comments)    unknown   Niacin Other (See Comments)    unknown   Sulfa Antibiotics Rash   Sulfonamide Derivatives Rash   Outpatient Encounter Medications as of 12/03/2021  Medication Sig   B Complex Vitamins (B COMPLEX-B12) TABS Take 1 tablet by mouth daily.   Cholecalciferol (VITAMIN D3 PO) Take 1 capsule by mouth daily.   glucose blood (ONETOUCH ULTRA) test strip Check BS up to 3 times daily Dx E11.9   LANTUS SOLOSTAR 100 UNIT/ML Solostar Pen  Inject 26 Units into the skin daily.   Magnesium 250 MG TABS Take 250 mg by mouth daily.   metoprolol succinate (TOPROL-XL) 100 MG 24 hr tablet TAKE 1 TABLET ONCE DAILY WITH A MEAL   Omega-3 Fatty Acids (FISH OIL) 1000 MG CAPS Take 1 capsule by mouth daily.   amLODipine (NORVASC) 10 MG tablet Take 1 tablet (10 mg total) by mouth daily. (Patient not taking: Reported on 11/02/2021)   atorvastatin (LIPITOR) 10 MG tablet Take 1 tablet (10 mg total) by mouth daily. (NEEDS TO BE SEEN BEFORE NEXT REFILL) (Patient not taking: Reported on 12/03/2021)   citalopram (CELEXA) 20 MG tablet Take 1 tablet (20 mg total) by mouth daily. (NEEDS TO BE SEEN BEFORE NEXT REFILL) (Patient not taking: Reported on 12/03/2021)   dipyridamole-aspirin (AGGRENOX) 200-25 MG 12hr capsule Take by mouth. (Patient not taking: Reported on 12/03/2021)   fluticasone (FLONASE) 50 MCG/ACT nasal spray USE 2 SPRAYS IN EACH NOSTRIL ONCE DAILY. (Patient not taking: Reported on 12/03/2021)   furosemide (LASIX) 20 MG tablet Take 1 tablet (20 mg total) by mouth daily. (Patient not taking: Reported on 12/03/2021)   LORazepam (ATIVAN) 0.5 MG tablet Take 1 tablet (0.5 mg total) by mouth 2 (two) times daily. (Patient not taking: Reported on 12/03/2021)   olmesartan (BENICAR) 40 MG tablet Take 1 tablet (40 mg total) by mouth daily. (Patient not taking: Reported on 11/02/2021)   pantoprazole (PROTONIX) 40 MG tablet Take 1 tablet (40 mg total) by mouth daily. (NEEDS TO BE SEEN BEFORE NEXT REFILL) (Patient not taking: Reported on 12/03/2021)   potassium chloride (KLOR-CON) 10 MEQ tablet Take 1 tablet (10 mEq total) by mouth as directed. Mon, Wed, and Fri only. (Patient not taking: Reported on 12/03/2021)   [DISCONTINUED] azithromycin (ZITHROMAX Z-PAK) 250 MG tablet As directed   No facility-administered encounter medications on file as of 12/03/2021.    Past Surgical History:  Procedure Laterality Date   ABDOMINAL HYSTERECTOMY     BACK SURGERY  1994    CATARACT EXTRACTION W/PHACO Left 09/19/2017   Procedure: CATARACT EXTRACTION PHACO AND INTRAOCULAR LENS PLACEMENT ([place]);  Surgeon: Baruch Goldmann, MD;  Location: [facility];  Service: Ophthalmology;  Laterality: Left;  CDE: 8.10   CATARACT EXTRACTION W/PHACO Right 10/10/2017   Procedure: CATARACT EXTRACTION PHACO AND INTRAOCULAR LENS PLACEMENT RIGHT EYE;  Surgeon: Baruch Goldmann, MD;  Location: [facility];  Service: Ophthalmology;  Laterality: Right;  CDE: 9.60   CHOLECYSTECTOMY N/A 05/10/2021   Procedure: LAPAROSCOPIC CHOLECYSTECTOMY;  Surgeon: Clovis Riley, MD;  Location: [place];  Service: General;  Laterality: N/A;   COLON RESECTION  1990's   TUBAL LIGATION  VESICOVAGINAL FISTULA CLOSURE W/ TAH  1984    Family History  Problem Relation Age of Onset   Coronary artery disease Sister        stent x 3   Seizures Sister    Coronary artery disease Brother        stent   Hypertension Mother    Stroke Mother    Emphysema Father    Alcohol abuse Brother    Lung disease Brother    Heart attack Daughter       Controlled substance contract: 12/03/21     Review of Systems  Constitutional:  Negative for diaphoresis.  Eyes:  Negative for pain.  Respiratory:  Negative for shortness of breath.   Cardiovascular:  Negative for chest pain, palpitations and leg swelling.  Gastrointestinal:  Negative for abdominal pain.  Endocrine: Negative for polydipsia.  Skin:  Negative for rash.  Neurological:  Negative for dizziness, weakness and headaches.  Hematological:  Does not bruise/bleed easily.  All other systems reviewed and are negative.     Objective:   Physical Exam Vitals and nursing note reviewed.  Constitutional:      General: She is not in acute distress.    Appearance: Normal appearance. She is well-developed.  HENT:     Head: Normocephalic.     Right Ear: Tympanic membrane normal.     Left Ear: Tympanic membrane normal.     Nose: Nose normal.     Mouth/Throat:     Mouth:  Mucous membranes are moist.  Eyes:     Pupils: Pupils are equal, round, and reactive to light.  Neck:     Vascular: No carotid bruit or JVD.  Cardiovascular:     Rate and Rhythm: Normal rate and regular rhythm.     Heart sounds: Normal heart sounds.  Pulmonary:     Effort: Pulmonary effort is normal. No respiratory distress.     Breath sounds: Normal breath sounds. No wheezing or rales.  Chest:     Chest wall: No tenderness.  Abdominal:     General: Bowel sounds are normal. There is no distension or abdominal bruit.     Palpations: Abdomen is soft. There is no hepatomegaly, splenomegaly, mass or pulsatile mass.     Tenderness: There is no abdominal tenderness.  Musculoskeletal:        General: Normal range of motion.     Cervical back: Normal range of motion and neck supple.  Lymphadenopathy:     Cervical: No cervical adenopathy.  Skin:    General: Skin is warm and dry.  Neurological:     Mental Status: She is alert and oriented to person, place, and time.     Deep Tendon Reflexes: Reflexes are normal and symmetric.  Psychiatric:        Behavior: Behavior normal.        Thought Content: Thought content normal.        Judgment: Judgment normal.    BP (!) 158/82    Pulse 66    Temp 97.8 F (36.6 C) (Temporal)    Resp 20    Ht _0  (1.549 m)    Wt 112 lb (50.8 kg)    SpO2 95%    BMI 21.16 kg/m   HGBA1c 7.0        Assessment & Plan:  JANISA LABUS comes in today with chief complaint of Medical Management of Chronic Issues   Diagnosis and orders addressed:  1. Coronary artery disease involving native  coronary artery of native heart without angina pectoris Ref to cardiology to address heart rate - metoprolol succinate (TOPROL-XL) 50 MG 24 hr tablet; Take 1 tablet (50 mg total) by mouth daily. Take with or immediately following a meal.  Dispense: 90 tablet; Refill: 1  2. Essential hypertension Low sodium diet - CBC with Differential/Platelet - CMP14+EGFR -  olmesartan (BENICAR) 40 MG tablet; Take 1 tablet (40 mg total) by mouth daily.  Dispense: 90 tablet; Refill: 1 - amLODipine (NORVASC) 10 MG tablet; Take 1 tablet (10 mg total) by mouth daily.  Dispense: 90 tablet; Refill: 1 - potassium chloride (KLOR-CON) 10 MEQ tablet; Take 1 tablet (10 mEq total) by mouth as directed. Mon, Wed, and Fri only.  Dispense: 90 tablet; Refill: 1 - CBC with Differential/Platelet - CMP14+EGFR  3. Type 2 diabetes mellitus treated with insulin (HCC) Insulin 30u a day- DO NOT ADJUST BASED ON BLOOD SUGAR UNLES TOLD TO DO SO BY PROVIDER!!! Continue to keep diary of blood sugars Watch carbs in diet - Bayer DCA Hb A1c Waived  4. Hyperlipidemia, unspecified hyperlipidemia type Low fat diet - Lipid panel - atorvastatin (LIPITOR) 10 MG tablet; Take 1 tablet (10 mg total) by mouth daily. (NEEDS TO BE SEEN BEFORE NEXT REFILL)  Dispense: 90 tablet; Refill: 1 - Lipid panel  5. Gastroesophageal reflux disease without esophagitis /Avoid spicy foods Do not eat 2 hours prior to bedtime - pantoprazole (PROTONIX) 40 MG tablet; Take 1 tablet (40 mg total) by mouth daily. (NEEDS TO BE SEEN BEFORE NEXT REFILL)  Dispense: 90 tablet; Refill: 1  6. Chronic kidney disease (CKD) stage G3a/A3, moderately decreased glomerular filtration rate (GFR) between 45-59 mL/min/1.73 square meter and albuminuria creatinine ratio greater than 300 mg/g (HCC) Labs pending  7. Peripheral edema Elevate legs when sitting - furosemide (LASIX) 20 MG tablet; Take 1 tablet (20 mg total) by mouth daily.  Dispense: 90 tablet; Refill: 1  8. Recurrent major depressive disorder, in full remission ([place]) Stress management  9. GAD (generalized anxiety disorder)  10. Late effect of cerebrovascular accident (CVA) Fall prevention  74. Bradycardia Decrease metoprolol to 59m XL daily to see if heart rate will  improve - Ambulatory referral to Cardiology   Labs pending Health Maintenance reviewed Diet and  exercise encouraged  Follow up plan: 1 month   [place] FNP

## 2021-12-03 NOTE — Patient Instructions (Signed)
Bradycardia, Adult Bradycardia is a slower-than-normal heartbeat. A normal resting heart rate for an adult ranges from 60 to 100 beats per minute. With bradycardia, the resting heart rate is less than 60 beats per minute. Bradycardia can prevent enough oxygen from reaching certain areas of your body when you are active. It can be serious if it keeps enough oxygen from reaching your brain and other parts of your body. Bradycardia is not a problem for everyone. For some healthy adults, a slow resting heart rate is normal. What are the causes? This condition may be caused by: A problem with the heart, including: A problem with the heart's electrical system, such as a heart block. With a heart block, electrical signals between the chambers of the heart are partially or completely blocked, so they are not able to work as they should. A problem with the heart's natural pacemaker (sinus node). Heart disease. A heart attack. Heart damage. Lyme disease. A heart infection. A heart condition that is present at birth (congenital heart defect). Certain medicines that treat heart conditions. Certain conditions, such as hypothyroidism and obstructive sleep apnea. Problems with the balance of chemicals and other substances, like potassium, in the blood. Trauma. Radiation therapy. What increases the risk? You are more likely to develop this condition if you: Are age 90 or older. Have high blood pressure (hypertension), high cholesterol (hyperlipidemia), or diabetes. Drink heavily, use tobacco or nicotine products, or use drugs. What are the signs or symptoms? Symptoms of this condition include: Light-headedness. Feeling faint or fainting. Fatigue and weakness. Trouble with activity or exercise. Shortness of breath. Chest pain (angina). Drowsiness. Confusion. Dizziness. How is this diagnosed? This condition may be diagnosed based on: Your symptoms. Your medical history. A physical exam. During  the exam, your health care provider will listen to your heartbeat and check your pulse. To confirm the diagnosis, your health care provider may order tests, such as: Blood tests. An electrocardiogram (ECG). This test records the heart's electrical activity. The test can show how fast your heart is beating and whether the heartbeat is steady. A test in which you wear a portable device (event recorder or Holter monitor) to record your heart's electrical activity while you go about your day. An exercise test. How is this treated? Treatment for this condition depends on the cause of the condition and how severe your symptoms are. Treatment may involve: Treatment of the underlying condition. Changing your medicines or how much medicine you take. Having a small, battery-operated device called a pacemaker implanted under the skin. When bradycardia occurs, this device can be used to increase your heart rate and help your heart beat in a regular rhythm. Follow these instructions at home: Lifestyle Manage any health conditions that contribute to bradycardia as told by your health care provider. Follow a heart-healthy diet. A nutrition specialist (dietitian) can help educate you about healthy food options and changes. Follow an exercise program that is approved by your health care provider. Maintain a healthy weight. Try to reduce or manage your stress, such as with yoga or meditation. If you need help reducing stress, ask your health care provider. Do not use any products that contain nicotine or tobacco. These products include cigarettes, chewing tobacco, and vaping devices, such as e-cigarettes. If you need help quitting, ask your health care provider. Do not use illegal drugs. Alcohol use If you drink alcohol: Limit how much you have to: 0-1 drink a day for women who are not pregnant. 0-2 drinks a day  for men. Know how much alcohol is in a drink. In the [place] one drink equals one 12 oz bottle of  beer (355 mL), one 5 oz glass of wine (148 mL), or one 1 oz glass of hard liquor (44 mL). General instructions Take over-the-counter and prescription medicines only as told by your health care provider. Keep all follow-up visits. This is important. How is this prevented? In some cases, bradycardia may be prevented by: Treating underlying medical problems. Stopping behaviors or medicines that can trigger the condition. Contact a health care provider if: You feel light-headed or dizzy. You almost faint. You feel weak or are easily fatigued during physical activity. You experience confusion or have memory problems. Get help right away if: You faint. You have chest pains or an irregular heartbeat (palpitations). You have trouble breathing. These symptoms may represent a serious problem that is an emergency. Do not wait to see if the symptoms will go away. Get medical help right away. Call your local emergency services (911 in the [place] Do not drive yourself to the hospital. Summary Bradycardia is a slower-than-normal heartbeat. With bradycardia, the resting heart rate is less than 60 beats per minute. Treatment for this condition depends on the cause. Manage any health conditions that contribute to bradycardia as told by your health care provider. Do not use any products that contain nicotine or tobacco. These products include cigarettes, chewing tobacco, and vaping devices, such as e-cigarettes. Keep all follow-up visits. This is important. This information is not intended to replace advice given to you by your health care provider. Make sure you discuss any questions you have with your health care provider. Document Revised: 02/11/2021 Document Reviewed: 02/11/2021 Elsevier Patient Education  [place].

## 2021-12-04 ENCOUNTER — Telehealth: Payer: Self-pay | Admitting: Nurse Practitioner

## 2021-12-04 LAB — CBC WITH DIFFERENTIAL/PLATELET
Basophils Absolute: 0 10*3/uL (ref 0.0–0.2)
Basos: 1 %
EOS (ABSOLUTE): 0.3 10*3/uL (ref 0.0–0.4)
Eos: 4 %
Hematocrit: 41.5 % (ref 34.0–46.6)
Hemoglobin: 13.5 g/dL (ref 11.1–15.9)
Immature Grans (Abs): 0 10*3/uL (ref 0.0–0.1)
Immature Granulocytes: 0 %
Lymphocytes Absolute: 1.1 10*3/uL (ref 0.7–3.1)
Lymphs: 17 %
MCH: 29.2 pg (ref 26.6–33.0)
MCHC: 32.5 g/dL (ref 31.5–35.7)
MCV: 90 fL (ref 79–97)
Monocytes Absolute: 0.5 10*3/uL (ref 0.1–0.9)
Monocytes: 8 %
Neutrophils Absolute: 4.4 10*3/uL (ref 1.4–7.0)
Neutrophils: 70 %
Platelets: 158 10*3/uL (ref 150–450)
RBC: 4.63 x10E6/uL (ref 3.77–5.28)
RDW: 14.3 % (ref 11.7–15.4)
WBC: 6.3 10*3/uL (ref 3.4–10.8)

## 2021-12-04 LAB — CMP14+EGFR
ALT: 8 IU/L (ref 0–32)
AST: 22 IU/L (ref 0–40)
Albumin/Globulin Ratio: 1.6 (ref 1.2–2.2)
Albumin: 4.4 g/dL (ref 3.6–4.6)
Alkaline Phosphatase: 78 IU/L (ref 44–121)
BUN/Creatinine Ratio: 14 (ref 12–28)
BUN: 17 mg/dL (ref 8–27)
Bilirubin Total: 0.5 mg/dL (ref 0.0–1.2)
CO2: 24 mmol/L (ref 20–29)
Calcium: 10 mg/dL (ref 8.7–10.3)
Chloride: 101 mmol/L (ref 96–106)
Creatinine, Ser: 1.18 mg/dL — ABNORMAL HIGH (ref 0.57–1.00)
Globulin, Total: 2.8 g/dL (ref 1.5–4.5)
Glucose: 198 mg/dL — ABNORMAL HIGH (ref 70–99)
Potassium: 4.4 mmol/L (ref 3.5–5.2)
Sodium: 139 mmol/L (ref 134–144)
Total Protein: 7.2 g/dL (ref 6.0–8.5)
eGFR: 45 mL/min/{1.73_m2} — ABNORMAL LOW (ref >59)

## 2021-12-04 LAB — LIPID PANEL
Chol/HDL Ratio: 3.3 ratio (ref 0.0–4.4)
Cholesterol, Total: 150 mg/dL (ref 100–199)
HDL: 45 mg/dL (ref >39)
LDL Chol Calc (NIH): 72 mg/dL (ref 0–99)
Triglycerides: 198 mg/dL — ABNORMAL HIGH (ref 0–149)
VLDL Cholesterol Cal: 33 mg/dL (ref 5–40)

## 2021-12-04 NOTE — Telephone Encounter (Signed)
Pts daughter called stating that the referral that MMM sent for pt to see a Cardiologist was supposed to be a STAT order. Says they called the office of where the referral was sent, and was told that they cant see pt until March, which is too far out.  Says pts pulse has been in the 40s and needs to see someone ASAP.  Please advise and call daughter with update on referral. Wants it to be sent somewhere else if the office cant see pt sooner than that.

## 2021-12-11 ENCOUNTER — Ambulatory Visit (INDEPENDENT_AMBULATORY_CARE_PROVIDER_SITE_OTHER): Payer: Medicare Other | Admitting: Licensed Clinical Social Worker

## 2021-12-11 DIAGNOSIS — N1831 Chronic kidney disease, stage 3a: Secondary | ICD-10-CM

## 2021-12-11 DIAGNOSIS — I6930 Unspecified sequelae of cerebral infarction: Secondary | ICD-10-CM

## 2021-12-11 DIAGNOSIS — F411 Generalized anxiety disorder: Secondary | ICD-10-CM

## 2021-12-11 DIAGNOSIS — K219 Gastro-esophageal reflux disease without esophagitis: Secondary | ICD-10-CM

## 2021-12-11 DIAGNOSIS — F3342 Major depressive disorder, recurrent, in full remission: Secondary | ICD-10-CM

## 2021-12-11 DIAGNOSIS — I10 Essential (primary) hypertension: Secondary | ICD-10-CM

## 2021-12-11 NOTE — Patient Instructions (Addendum)
Visit Information  Patient Goals:  Manage My Emotions (Patient).  Manage Anxiety and Depression issues faced.   Timeframe:  Short-Term Goal Priority:  Medium Progress: On Track Start Date:           12/11/21                Expected End Date:   03/08/22  Follow Up Date  01/31/22  at 9:00 AM   Manage My Emotions (Patient)  Manage Anxiety and Depression issues faced    Why is this important?   When you are stressed, down or upset, your body reacts too.  For example, your blood pressure may get higher; you may have a headache or stomachache.  When your emotions get the best of you, your body's ability to fight off cold and flu gets weak.  These steps will help you manage your emotions.    Patient Self Care Activities:  Takes medications as prescribed Tries to complete ADLs as able  Patient Coping Strengths:  Has family support Attends scheduled medical appointments Has no transport needs  Patient Self Care Deficits:  Decreased appetite Some mobility issues  Patient Goals:  - spend time or talk with others every day - practice relaxation or meditation daily - keep a calendar with appointment dates  Follow Up Plan:  LCSW to call client or her daughter, Vickii Chafe, on 01/31/22 at 9:00 AM to assess client needs    Norva Riffle.Leven Hoel MSW, [place] Holiday representative [facility] Care Management [phone]

## 2021-12-11 NOTE — Chronic Care Management (AMB) (Signed)
Chronic Care Management    Clinical Social Work Note  12/11/2021 Name: Kara Dean MRN: [id number] DOB: Feb 20, 1933  [name] is a 88 y.o. year old female who is a primary care patient of [name], [place]. The CCM team was consulted to assist the patient with chronic disease management and/or care coordination needs related to: [employer] .   Engaged with patient / daughter of patient,[name] Grandville Silos, by telephone for follow up visit in response to provider referral for social work chronic care management and care coordination services.   Consent to Services:  The patient was given information about Chronic Care Management services, agreed to services, and gave verbal consent prior to initiation of services.  Please see initial visit note for detailed documentation.   Patient agreed to services and consent obtained.   Assessment: Review of patient past medical history, allergies, medications, and health status, including review of relevant consultants reports was performed today as part of a comprehensive evaluation and provision of chronic care management and care coordination services.     SDOH (Social Determinants of Health) assessments and interventions performed:  SDOH Interventions    Flowsheet Row Most Recent Value  SDOH Interventions   Physical Activity Interventions Other (Comments)  [walking challenges. she uses a cane to help her walk]  Stress Interventions Other (Comment)  [client has stress related to managing medical needs.]  Depression Interventions/Treatment  Medication        Advanced Directives Status: See Vynca application for related entries.  CCM Care Plan  Allergies  Allergen Reactions   Codeine Other (See Comments)    unknown   Latex Rash   Morphine Other (See Comments)    unknown   Niacin Other (See Comments)    unknown   Sulfa Antibiotics Rash   Sulfonamide Derivatives Rash    Outpatient Encounter Medications as of  12/11/2021  Medication Sig   amLODipine (NORVASC) 10 MG tablet Take 1 tablet (10 mg total) by mouth daily.   atorvastatin (LIPITOR) 10 MG tablet Take 1 tablet (10 mg total) by mouth daily. (NEEDS TO BE SEEN BEFORE NEXT REFILL)   B Complex Vitamins (B COMPLEX-B12) TABS Take 1 tablet by mouth daily.   Cholecalciferol (VITAMIN D3 PO) Take 1 capsule by mouth daily.   citalopram (CELEXA) 20 MG tablet Take 1 tablet (20 mg total) by mouth daily. (NEEDS TO BE SEEN BEFORE NEXT REFILL) (Patient not taking: Reported on 12/03/2021)   dipyridamole-aspirin (AGGRENOX) 200-25 MG 12hr capsule Take by mouth. (Patient not taking: Reported on 12/03/2021)   fluticasone (FLONASE) 50 MCG/ACT nasal spray USE 2 SPRAYS IN EACH NOSTRIL ONCE DAILY. (Patient not taking: Reported on 12/03/2021)   furosemide (LASIX) 20 MG tablet Take 1 tablet (20 mg total) by mouth daily.   glucose blood (ONETOUCH ULTRA) test strip Check BS up to 3 times daily Dx E11.9   LANTUS SOLOSTAR 100 UNIT/ML Solostar Pen Inject 26 Units into the skin daily.   LORazepam (ATIVAN) 0.5 MG tablet Take 1 tablet (0.5 mg total) by mouth 2 (two) times daily. (Patient not taking: Reported on 12/03/2021)   Magnesium 250 MG TABS Take 250 mg by mouth daily.   metoprolol succinate (TOPROL-XL) 50 MG 24 hr tablet Take 1 tablet (50 mg total) by mouth daily. Take with or immediately following a meal.   olmesartan (BENICAR) 40 MG tablet Take 1 tablet (40 mg total) by mouth daily.   Omega-3 Fatty Acids (FISH OIL) 1000 MG CAPS Take 1 capsule by mouth  daily.   pantoprazole (PROTONIX) 40 MG tablet Take 1 tablet (40 mg total) by mouth daily. (NEEDS TO BE SEEN BEFORE NEXT REFILL)   potassium chloride (KLOR-CON) 10 MEQ tablet Take 1 tablet (10 mEq total) by mouth as directed. Mon, Wed, and Fri only.   No facility-administered encounter medications on file as of 12/11/2021.    Patient Active Problem List   Diagnosis Date Noted   Elevated liver function tests    Choledocholithiasis  with acute cholecystitis 05/07/2021   Peripheral edema 03/18/2017   Chronic kidney disease (CKD) stage G3a/A3, moderately decreased glomerular filtration rate (GFR) between 45-59 mL/min/1.73 square meter and albuminuria creatinine ratio greater than 300 mg/g (HCC) 07/27/2014   Gastroesophageal reflux disease without esophagitis 04/27/2014   GAD (generalized anxiety disorder) 04/27/2014   Late effect of cerebrovascular accident (CVA) 03/26/2010   Depression 02/01/2009   Essential hypertension 02/01/2009   Osteoarthritis 02/01/2009    Conditions to be addressed/monitored: monitor client management of anxiety issues and depression issues  Care Plan : LCSW Care Plan  Updates made by Katha Cabal, LCSW since 12/11/2021 12:00 AM     Problem: Emotional Distress      Goal: Manage Anxiety and Depession issues faced   Start Date: 12/11/2021  Expected End Date: 03/08/2022  This Visit's Progress: On track  Recent Progress: On track  Priority: Medium  Note:   Current Barriers:  Chronic Mental Health needs related to management of anxiety and depression issues Decreased appetite Mobility challenges Suicidal Ideation/Homicidal Ideation: No  Clinical Social Work Goal(s):  patient will work with SW monthly by telephone or in person to reduce or manage symptoms related to anxiety and depression issues of client Patient will attend all scheduled client medical appointments in next 30 days Patient will call RNCM or LCSW as needed for CCM support in next 30 days  Interventions: 1:1 collaboration with [name], [place] regarding development and update of comprehensive plan of care as evidenced by provider attestation and co-signature Discussed client needs with  [name], daughter of client Reviewed sleeping challenges of client. [name] said client has difficulty sleeping Reviewed appetite issues of client. [name] said client has reduced appetite. [name] said client may drink 1 or 2  Boost supplements per day. Client eats small meals. Discussed walking challenges.of client. [name] said client uses a cane to help her walk.  Reviewed hearing needs of client. LCSW talked with [name] about [facility] Dual Complete program and that client may have benefit to help with hearing  LCSW encouraged [name] to call Member Services at [facility] to talk with representative about hearing benefit through Dual Complete program. Provided  counseling support to [place] related to the current needs of client. Reviewed  in home support for client.[name] said client has strong family support. Client has support from her daughters and from her grandchildren.  Discussed upcoming cardiology appointment for client. [name] said client has appointment this Friday with cardiologist  Patient Self Care Activities:  Takes medications as prescribed Tries to complete ADLs as able  Patient Coping Strengths:  Has family support Attends scheduled medical appointments Has no transport needs  Patient Self Care Deficits:  Decreased appetite Some mobility issues  Patient Goals:  - spend time or talk with others every day - practice relaxation or meditation daily - keep a calendar with appointment dates  Follow Up Plan:  LCSW to call client or daughter, Vickii Chafe, on 01/31/22 at 9:00 AM to assess client needs      Norva Riffle.Nielle Duford  MSW, [place] Clinical Social Worker [facility] Care Management [phone]

## 2021-12-12 NOTE — Progress Notes (Signed)
Office Visit    Patient Name: Kara Dean Date of Encounter: 12/14/2021  PCP:  Kara Dean, [facility]  Cardiologist:  Kara Mocha, MD  Advanced Practice Provider:  Liliane Shi, PA-C Electrophysiologist:  None   HPI    Kara Dean is a 88 y.o. female with a hx of hypertension, hyperlipidemia, diabetes mellitus type 2, longstanding chest pain, history of TIA, carotid artery disease, mild to moderate AI, and mild MR presents today for follow-up visit.  The patient was seen by Kara Dean in May 2021.  She returned to follow-up May 2022 with Kara Dopp, PA.  Overall she been doing well at that time.  She has had chronic chest symptoms.  She has had no significant change in her chest pain pattern.  She had not had any exertional symptoms.  She did occasionally get short of breath with activities.  She had not had any significant leg edema.  She denied syncope.  Sometimes she did get lightheaded when she stands.  Her diuretics were decreased to furosemide 20 mg every Monday Wednesday and Friday.  Today, she states that since her gallbladder surgery she has been dizzy, lightheaded, and feels off balance when she is walking.  She does admit to not drinking really any water throughout the day and not having much of an appetite.  She is on Lasix as needed and has not been taking this medication.  She does take metoprolol succinate 50 mg daily and her caregiver feels like this is too much medication.  She had reported.  The time where her heart rate was in the 40s and was concerned.  We discussed hydration status today and how important it is to get at least 64 ounces of water.  We also discussed decreasing her metoprolol to see if this helps increase heart rate and ultimately resolves her symptoms.  I also encouraged her to touch back with her gallbladder surgeon to discuss her lack of appetite.  I encouraged Glucerna for supplementation.  She did  endorse occasional fluttering in her chest.  She did have some PVCs on her EKG today.  Reports no shortness of breath nor dyspnea on exertion. Reports no chest pain, pressure, or tightness. No edema, orthopnea, PND.   Past Medical History    Past Medical History:  Diagnosis Date   Anxiety    Carotid artery disease ([place])    Carotid US 11/2018:  bilateral ICA 1-39 >> repeat 2 years.    Cataract    Crohn's colitis ([place])    CVA (cerebral infarction)    No deficits   Depression    Diabetes mellitus    type 2   Dyslipidemia    GERD (gastroesophageal reflux disease)    History of TIAs    no deficits   Hyperlipidemia    Hypertension    Hyponatremia    Nodular basal cell carcinoma (BCC) 04/22/2018   left prox, nasal root (tx p bx)   Osteoarthritis    SCC (squamous cell carcinoma) 04/22/2018   left shin,superior-(tx p bx), Left shin,inferior-(tx p bx )   Past Surgical History:  Procedure Laterality Date   ABDOMINAL HYSTERECTOMY     BACK SURGERY  1994   CATARACT EXTRACTION W/PHACO Left 09/19/2017   Procedure: CATARACT EXTRACTION PHACO AND INTRAOCULAR LENS PLACEMENT ([place]);  Surgeon: Kara Goldmann, MD;  Location: [facility];  Service: Ophthalmology;  Laterality: Left;  CDE: 8.10   CATARACT EXTRACTION W/PHACO Right 10/10/2017  Procedure: CATARACT EXTRACTION PHACO AND INTRAOCULAR LENS PLACEMENT RIGHT EYE;  Surgeon: Kara Goldmann, MD;  Location: [facility];  Service: Ophthalmology;  Laterality: Right;  CDE: 9.60   CHOLECYSTECTOMY N/A 05/10/2021   Procedure: LAPAROSCOPIC CHOLECYSTECTOMY;  Surgeon: Kara Riley, MD;  Location: [facility];  Service: General;  Laterality: N/A;   COLON RESECTION  1990's   TUBAL LIGATION     VESICOVAGINAL FISTULA CLOSURE W/ TAH  1984    Allergies  Allergies  Allergen Reactions   Codeine Other (See Comments)    unknown   Latex Rash   Morphine Other (See Comments)    unknown   Niacin Other (See Comments)    unknown   Sulfa Antibiotics Rash   Sulfonamide  Derivatives Rash    EKGs/Labs/Other Studies Reviewed:   The following studies were reviewed today: Echocardiogram 04/14/20 EF 60-65, no RWMA, GRII DD, normal RVSF, RVSP 69.5, moderate LAE, mild MR, mild-moderate AI, AV sclerosis without stenosis   Carotid US 11/11/2018 Bilat ICA 1-39 >> repeat in 1/22   Carotid US 05/27/17 Heterogeneous plaque, bilaterally. Progression of RICA disease with higher velocities compared to prior exam, now in high end 1-39% range of stenosis. Stable 5-63% LICA stenosis. Normal subclavian arteries, bilaterally. Patent vertebral arteries with antegrade flow. f/u 1 year due to prograssion of disease   Nuclear stress test 02/15/13 Normal stress nuclear study.  LV Ejection Fraction: 68%.  EKG:  EKG is  ordered today.  The ekg ordered today demonstrates NSR with occasional PVCs  Recent Labs: 05/07/2021: Magnesium 2.1 12/03/2021: ALT 8; BUN 17; Creatinine, Ser 1.18; Hemoglobin 13.5; Platelets 158; Potassium 4.4; Sodium 139  Recent Lipid Panel    Component Value Date/Time   CHOL 150 12/03/2021 1422   CHOL 124 02/26/2013 1043   TRIG 198 (H) 12/03/2021 1422   TRIG 132 03/09/2015 1036   TRIG 182 (H) 02/26/2013 1043   HDL 45 12/03/2021 1422   HDL 39 (L) 03/09/2015 1036   HDL 40 02/26/2013 1043   CHOLHDL 3.3 12/03/2021 1422   CHOLHDL 3.0 11/10/2010 0327   VLDL 25 11/10/2010 0327   LDLCALC 72 12/03/2021 1422   LDLCALC 42 04/27/2014 1455   LDLCALC 48 02/26/2013 1043    Home Medications   Current Meds  Medication Sig   amLODipine (NORVASC) 10 MG tablet Take 1 tablet (10 mg total) by mouth daily.   atorvastatin (LIPITOR) 10 MG tablet Take 1 tablet (10 mg total) by mouth daily. (NEEDS TO BE SEEN BEFORE NEXT REFILL)   B Complex Vitamins (B COMPLEX-B12) TABS Take 1 tablet by mouth daily.   Cholecalciferol (VITAMIN D3 PO) Take 1 capsule by mouth daily.   dipyridamole-aspirin (AGGRENOX) 200-25 MG 12hr capsule Take 1 capsule by mouth daily.   fluticasone  (FLONASE) 50 MCG/ACT nasal spray USE 2 SPRAYS IN EACH NOSTRIL ONCE DAILY.   furosemide (LASIX) 20 MG tablet Take 1 tablet (20 mg total) by mouth daily.   glucose blood (ONETOUCH ULTRA) test strip Check BS up to 3 times daily Dx E11.9   LANTUS SOLOSTAR 100 UNIT/ML Solostar Pen Inject 26 Units into the skin daily. (Patient taking differently: Inject 30 Units into the skin daily.)   LORazepam (ATIVAN) 0.5 MG tablet Take 1 tablet (0.5 mg total) by mouth 2 (two) times daily.   Magnesium 250 MG TABS Take 250 mg by mouth daily.   metoprolol succinate (TOPROL-XL) 25 MG 24 hr tablet Take 1 tablet (25 mg total) by mouth daily. Take with or immediately following a  meal.   olmesartan (BENICAR) 40 MG tablet Take 1 tablet (40 mg total) by mouth daily.   Omega-3 Fatty Acids (FISH OIL) 1000 MG CAPS Take 1 capsule by mouth daily.   pantoprazole (PROTONIX) 40 MG tablet Take 1 tablet (40 mg total) by mouth daily. (NEEDS TO BE SEEN BEFORE NEXT REFILL)   potassium chloride (KLOR-CON) 10 MEQ tablet Take 1 tablet (10 mEq total) by mouth as directed. Mon, Wed, and Fri only.   [DISCONTINUED] metoprolol succinate (TOPROL-XL) 50 MG 24 hr tablet Take 1 tablet (50 mg total) by mouth daily. Take with or immediately following a meal.     Review of Systems      All other systems reviewed and are otherwise negative except as noted above.  Physical Exam    VS:  BP (!) 148/82    Pulse 61    Ht 5' 1"  (1.549 m)    Wt 114 lb 8 oz (51.9 kg)    SpO2 95%    BMI 21.63 kg/m  , BMI Body mass index is 21.63 kg/m.  Wt Readings from Last 3 Encounters:  12/14/21 114 lb 8 oz (51.9 kg)  12/03/21 112 lb (50.8 kg)  11/02/21 116 lb (52.6 kg)     GEN: Well nourished, well developed, in no acute distress. HEENT: normal. Neck: Supple, no JVD, carotid bruits, or masses. Cardiac: RRR with occasional skipped beats, no murmurs, rubs, or gallops. No clubbing, cyanosis, edema.  Radials/PT 2+ and equal bilaterally.  Respiratory:  Respirations  regular and unlabored, clear to auscultation bilaterally. GI: Soft, nontender, nondistended. MS: No deformity or atrophy. Skin: Warm and dry, no rash. Neuro:  Strength and sensation are intact. Psych: Normal affect.  Assessment & Plan    Essential hypertension -Well controlled today in the clinic -Continue to monitor at home and record these readings -Continue Norvasc and Benicar  Mixed hyperlipidemia -most recent Lipid panel: total cholesterol 150, HDL 45, LDL 72, and triglycerides 198 -Increase fish oil to twice a day  (2,068m) and encourage diet changes  -Plan to continue Lipitor 11mdaily -Will be due for a follow-up lipid panel in a year  Bilateral carotid artery stenosis -mild plaque in her left carotid, plan to repeat in 24 months (06/2023)  Mild to moderate aortic insufficiency -Will order repeat echocardiogram since her last one was 2021 -With her symptoms follow-up imaging can assist in medical management, unlikely a candidate for open surgery  5. Dehydration -Likely contributing to her lightheaded/dizziness and unsteadiness on her feet -Discussed increasing water intake to 64 oz daily -CMP today to check electrolytes and renal function  6. Sinus bradycardia/PVCs -Decrease her metoprolol succinate to 2523maily -She does not seem particularly symptomatic from her PVCs however, if the reduction of her metoprolol causes an increase in symptoms we can consider a monitor for 7 days.  -I think the bigger concern was a HR in the 40s reported by her caregiver today which seems intermittent coupled with her dizziness/lightedness    Disposition: Follow up 1 month with me and 6 months with MicSherren MochaD or APP.  Signed, TesElgie CollardA-C 12/14/2021, 10:00 AM [place]

## 2021-12-14 ENCOUNTER — Encounter (HOSPITAL_BASED_OUTPATIENT_CLINIC_OR_DEPARTMENT_OTHER): Payer: Self-pay | Admitting: Physician Assistant

## 2021-12-14 ENCOUNTER — Ambulatory Visit (INDEPENDENT_AMBULATORY_CARE_PROVIDER_SITE_OTHER): Payer: Medicare Other | Admitting: Physician Assistant

## 2021-12-14 ENCOUNTER — Other Ambulatory Visit: Payer: Self-pay

## 2021-12-14 VITALS — BP 148/82 | HR 61 | Ht 61.0 in | Wt 114.5 lb

## 2021-12-14 DIAGNOSIS — R609 Edema, unspecified: Secondary | ICD-10-CM

## 2021-12-14 DIAGNOSIS — I6523 Occlusion and stenosis of bilateral carotid arteries: Secondary | ICD-10-CM | POA: Diagnosis not present

## 2021-12-14 DIAGNOSIS — E782 Mixed hyperlipidemia: Secondary | ICD-10-CM

## 2021-12-14 DIAGNOSIS — I351 Nonrheumatic aortic (valve) insufficiency: Secondary | ICD-10-CM

## 2021-12-14 DIAGNOSIS — I38 Endocarditis, valve unspecified: Secondary | ICD-10-CM | POA: Diagnosis not present

## 2021-12-14 DIAGNOSIS — I10 Essential (primary) hypertension: Secondary | ICD-10-CM

## 2021-12-14 LAB — COMPREHENSIVE METABOLIC PANEL
ALT: 10 IU/L (ref 0–32)
AST: 18 IU/L (ref 0–40)
Albumin/Globulin Ratio: 1.6 (ref 1.2–2.2)
Albumin: 4.4 g/dL (ref 3.6–4.6)
Alkaline Phosphatase: 73 IU/L (ref 44–121)
BUN/Creatinine Ratio: 13 (ref 12–28)
BUN: 16 mg/dL (ref 8–27)
Bilirubin Total: 0.7 mg/dL (ref 0.0–1.2)
CO2: 25 mmol/L (ref 20–29)
Calcium: 10.3 mg/dL (ref 8.7–10.3)
Chloride: 103 mmol/L (ref 96–106)
Creatinine, Ser: 1.23 mg/dL — ABNORMAL HIGH (ref 0.57–1.00)
Globulin, Total: 2.8 g/dL (ref 1.5–4.5)
Glucose: 206 mg/dL — ABNORMAL HIGH (ref 70–99)
Potassium: 5.1 mmol/L (ref 3.5–5.2)
Sodium: 143 mmol/L (ref 134–144)
Total Protein: 7.2 g/dL (ref 6.0–8.5)
eGFR: 43 mL/min/{1.73_m2} — ABNORMAL LOW (ref >59)

## 2021-12-14 MED ORDER — METOPROLOL SUCCINATE ER 25 MG PO TB24: 25.0000 mg | ORAL_TABLET | Freq: Every day | ORAL | 3 refills | Status: DC

## 2021-12-14 NOTE — Patient Instructions (Signed)
Medication Instructions:  Your physician has recommended you make the following change in your medication:   Change: Metoprolol Succinate to 69m daily   Change: Fish Oil 2,0066mdaily  *If you need a refill on your cardiac medications before your next appointment, please call your pharmacy*   Lab Work: Your physician recommends that you return for lab work Today- CMP   If you have labs (blood work) drawn today and your tests are completely normal, you will receive your results only by: My[place] you have MyChart) OR A paper copy in the mail If you have any lab test that is abnormal or we need to change your treatment, we will call you to review the results.   Testing/Procedures: Your physician has requested that you have an echocardiogram. Echocardiography is a painless test that uses sound waves to create images of your heart. It provides your doctor with information about the size and shape of your heart and how well your hearts chambers and valves are working. This procedure takes approximately one hour. There are no restrictions for this procedure. [facility] and your health needs are our priority.  As part of our continuing mission to provide you with exceptional heart care, we have created designated Provider Care Teams.  These Care Teams include your primary Cardiologist (physician) and Advanced Practice Providers (APPs -  Physician Assistants and Nurse Practitioners) who all work together to provide you with the care you need, when you need it.  We recommend signing up for the patient portal called "MyChart".  Sign up information is provided on this After Visit Summary.  MyChart is used to connect with patients for Virtual Visits (Telemedicine).  Patients are able to view lab/test results, encounter notes, upcoming appointments, etc.  Non-urgent messages can be sent to your provider as well.   To learn more  about what you can do with MyChart, go to [email or web address]   Your next appointment:   Follow up in 1 month with TeNicholes RoughPA and in 6 Months with Dr. MiSherren Mocha  Other Instructions Please ensure you are taking in 64oz of oral hydration each day   You may use Glucerna for a dietary supplement!

## 2021-12-17 ENCOUNTER — Telehealth (HOSPITAL_BASED_OUTPATIENT_CLINIC_OR_DEPARTMENT_OTHER): Payer: Self-pay

## 2021-12-17 NOTE — Telephone Encounter (Addendum)
Results called to patient who endorses understanding!      ----- Message from Elgie Collard, PA-C sent at 12/17/2021  8:13 AM EST ----- Please let the patient know:   Ms. Roettger,   Your sugar was a little high, please work with your PCP to get this a little better controlled. Your kidney function was consistent with what its been in the past. Your numbers are slightly elevated but have been in the past. Continue to hydrate and try to get at least 64 oz of water a day.   Take care,   Elgie Collard, PA-C

## 2021-12-19 ENCOUNTER — Ambulatory Visit (INDEPENDENT_AMBULATORY_CARE_PROVIDER_SITE_OTHER): Payer: Medicare Other

## 2021-12-19 ENCOUNTER — Other Ambulatory Visit: Payer: Self-pay

## 2021-12-19 DIAGNOSIS — I351 Nonrheumatic aortic (valve) insufficiency: Secondary | ICD-10-CM | POA: Diagnosis not present

## 2021-12-19 LAB — ECHOCARDIOGRAM COMPLETE
AR max vel: 1.21 cm2
AV Area VTI: 1.2 cm2
AV Area mean vel: 1.29 cm2
AV Mean grad: 6 mmHg
AV Peak grad: 12.4 mmHg
AV Vena cont: 0.44 cm
Ao pk vel: 1.76 m/s
Area-P 1/2: 2.31 cm2
MV VTI: 1.11 cm2
P 1/2 time: 538 msec
S' Lateral: 2.58 cm
Single Plane A4C EF: 59.5 %

## 2021-12-20 ENCOUNTER — Telehealth (HOSPITAL_BASED_OUTPATIENT_CLINIC_OR_DEPARTMENT_OTHER): Payer: Self-pay

## 2021-12-20 NOTE — Telephone Encounter (Addendum)
Results called to patient!    ----- Message from Elgie Collard, PA-C sent at 12/20/2021  4:33 PM EST ----- Please let the patient know:  Kara Dean,   Your heart function is normal. You have a mild mitral valve leak, moderate tricuspid valve leak, moderate thickening and moderate leaking of your aortic valve. Continue current mediation regimen at this time. If you have questions please let me know.   Take care,   Elgie Collard, PA-C

## 2021-12-25 ENCOUNTER — Other Ambulatory Visit: Payer: Self-pay | Admitting: Nurse Practitioner

## 2021-12-25 ENCOUNTER — Ambulatory Visit (INDEPENDENT_AMBULATORY_CARE_PROVIDER_SITE_OTHER): Payer: Medicare Other | Admitting: Nurse Practitioner

## 2021-12-25 ENCOUNTER — Encounter: Payer: Self-pay | Admitting: Nurse Practitioner

## 2021-12-25 VITALS — BP 133/57 | HR 61 | Temp 98.3°F | Ht 61.0 in | Wt 114.2 lb

## 2021-12-25 DIAGNOSIS — I2510 Atherosclerotic heart disease of native coronary artery without angina pectoris: Secondary | ICD-10-CM

## 2021-12-25 MED ORDER — LANTUS SOLOSTAR 100 UNIT/ML ~~LOC~~ SOPN: 30.0000 [IU] | PEN_INJECTOR | Freq: Every day | SUBCUTANEOUS | 5 refills | Status: DC

## 2021-12-25 NOTE — Progress Notes (Signed)
° °  Subjective:    Patient ID: Kara Dean, female    DOB: Jul 07, 1933, 88 y.o.   MRN: [id number]   Chief Complaint: bradycardia  HPI Patient came in on 12/03/21 for follow up of chronic medical problems. At that time her heat rate was 66 and she was having some dizzy spells. She was referred to cardiology. They changed her metoprolol to 26m. She is not sure if she has been taking 1/2 of 534mtablet or 1/2 of 2561mablet. After furtther discussion it was determined that she has been taking 1/2 of 35m55mblet. She is feeling some better.     Review of Systems  Constitutional:  Negative for diaphoresis.  Eyes:  Positive for discharge. Negative for pain.  Respiratory:  Negative for shortness of breath.   Cardiovascular:  Negative for chest pain, palpitations and leg swelling.  Gastrointestinal:  Negative for abdominal pain.  Endocrine: Negative for polydipsia.  Skin:  Negative for rash.  Neurological:  Negative for dizziness, weakness, light-headedness and headaches.  Hematological:  Does not bruise/bleed easily.  All other systems reviewed and are negative.     Objective:   Physical Exam Vitals reviewed.  Constitutional:      Appearance: Normal appearance.  Cardiovascular:     Rate and Rhythm: Normal rate and regular rhythm.     Heart sounds: Normal heart sounds.  Pulmonary:     Effort: Pulmonary effort is normal.     Breath sounds: Normal breath sounds.  Skin:    General: Skin is warm.  Neurological:     General: No focal deficit present.     Mental Status: She is alert and oriented to person, place, and time.  Psychiatric:        Mood and Affect: Mood normal.        Behavior: Behavior normal.   BP (!) 133/57 Comment: at home reading   Pulse 61    Temp 98.3 F (36.8 C) (Temporal)    Ht 5' 1"  (1.549 m)    Wt 114 lb 4 oz (51.8 kg)    BMI 21.59 kg/m          Assessment & Plan:   Kara Dean with chief complaint of Medical Management of Chronic  Issues   1. Coronary artery disease involving native coronary artery of native heart without angina pectoris Continue metoprolol as ordered- 25mg61may Report any dizziness Keep follow up appointment with cardiology    The above assessment and management plan was discussed with the patient. The patient verbalized understanding of and has agreed to the management plan. Patient is aware to call the clinic if symptoms persist or worsen. Patient is aware when to return to the clinic for a follow-up visit. Patient educated on when it is appropriate to go to the emergency department.   Mary-Margaret MartiHassell Done

## 2021-12-25 NOTE — Patient Instructions (Signed)

## 2022-01-14 ENCOUNTER — Ambulatory Visit: Payer: Medicare Other | Admitting: Physician Assistant

## 2022-01-17 NOTE — Progress Notes (Signed)
? ? ?Office Visit  ?  ?Patient Name: Kara Dean ?Date of Encounter: 01/17/2022 ? ?Primary Care Provider:  [name], FNP ?Primary Cardiologist:  Sherren Mocha, MD ?Primary Electrophysiologist: None ?Chief Complaint  ?  ?Kara Dean is a 88 y.o. female presents today for 1 month follow-up for dizziness and low HR ? ?History of Present Illness  ?  ?Kara Dean is a 88 y.o. female with PMH of HTN, HLD, DM II, TIA, carotid artery disease, HFpEF, Crohn's disease.  Nuclear stress test completed 4/14 with ejection fraction 60%.  Carotid ultrasound completed 7/18 progression stenosis of 56% and LICA, RICA, yearly surveillance. Echo completed 6/21 with EF 60-65, no RWMA, GRII DD, normal RVSF, RVSP 69.5, moderate LAE, mild MR, mild-moderate AI, AV sclerosis without stenosis.   ? ?She presented to [facility] on 05/06/21 with complaint of substernal chest pain  Lexiscan stress test performed with no reversible ischemia, and mild apical thinning.  Her last echo was on 2/15, LVEF 60-65%, mildly dilated LA MV abnormal, moderate TV regurgitation, moderate thickening of aortic valve with moderate regurgitation, no evidence of stenosis. ? ?Since last being seen in our clinic Kara Dean has been doing better and her heart rate has improved from the 40 to 55-65.  Her blood pressure remains in the 140;s over 60's. She has no complaints of palpitations since metoprolol was decreased to 25 mg daily.  Today she denies chest pain, palpitations, dyspnea, PND, orthopnea, nausea, vomiting, dizziness, syncope, edema, weight gain, or early satiety.  She is euvolemic on examination and lungs are clear to auscultation.  She is active and walks to her mailbox daily and also enjoys shopping at [employer].  She is still struggling with hydration and is supplementing with boost shakes.  She was advised to watch the sugar intake of her boost shakes.  She reports normal urinary output and currently denies taking  Lasix. ? ?Past Medical History  ?  ?Past Medical History:  ?Diagnosis Date  ? Anxiety   ? Carotid artery disease ([place])   ? Carotid US 11/2018:  bilateral ICA 1-39 >> repeat 2 years.   ? Cataract   ? Crohn's colitis ([place])   ? CVA (cerebral infarction)   ? No deficits  ? Depression   ? Diabetes mellitus   ? type 2  ? Dyslipidemia   ? GERD (gastroesophageal reflux disease)   ? History of TIAs   ? no deficits  ? Hyperlipidemia   ? Hypertension   ? Hyponatremia   ? Nodular basal cell carcinoma (BCC) 04/22/2018  ? left prox, nasal root (tx p bx)  ? Osteoarthritis   ? SCC (squamous cell carcinoma) 04/22/2018  ? left shin,superior-(tx p bx), Left shin,inferior-(tx p bx )  ? ?Past Surgical History:  ?Procedure Laterality Date  ? ABDOMINAL HYSTERECTOMY    ? BACK SURGERY  1994  ? CATARACT EXTRACTION W/PHACO Left 09/19/2017  ? Procedure: CATARACT EXTRACTION PHACO AND INTRAOCULAR LENS PLACEMENT (IOC);  Surgeon: Baruch Goldmann, MD;  Location: [facility];  Service: Ophthalmology;  Laterality: Left;  CDE: 8.10  ? CATARACT EXTRACTION W/PHACO Right 10/10/2017  ? Procedure: CATARACT EXTRACTION PHACO AND INTRAOCULAR LENS PLACEMENT RIGHT EYE;  Surgeon: Baruch Goldmann, MD;  Location: [facility];  Service: Ophthalmology;  Laterality: Right;  CDE: 9.60  ? CHOLECYSTECTOMY N/A 05/10/2021  ? Procedure: LAPAROSCOPIC CHOLECYSTECTOMY;  Surgeon: Clovis Riley, MD;  Location: [facility];  Service: General;  Laterality: N/A;  ? COLON RESECTION  1990's  ?  TUBAL LIGATION    ? VESICOVAGINAL FISTULA CLOSURE W/ TAH  1984  ? ? ?Allergies ? ?Allergies  ?Allergen Reactions  ? Codeine Other (See Comments)  ?  unknown  ? Latex Rash  ? Morphine Other (See Comments)  ?  unknown  ? Niacin Other (See Comments)  ?  unknown  ? Sulfa Antibiotics Rash  ? Sulfonamide Derivatives Rash  ? ? ?Home Medications  ?  ?Current Outpatient Medications  ?Medication Sig Dispense Refill  ? amLODipine (NORVASC) 10 MG tablet Take 1 tablet (10 mg total) by mouth daily. 90 tablet 1  ?  atorvastatin (LIPITOR) 10 MG tablet Take 1 tablet (10 mg total) by mouth daily. (NEEDS TO BE SEEN BEFORE NEXT REFILL) 90 tablet 1  ? B Complex Vitamins (B COMPLEX-B12) TABS Take 1 tablet by mouth daily.    ? Cholecalciferol (VITAMIN D3 PO) Take 1 capsule by mouth daily.    ? citalopram (CELEXA) 20 MG tablet Take 1 tablet (20 mg total) by mouth daily. (NEEDS TO BE SEEN BEFORE NEXT REFILL) 90 tablet 1  ? dipyridamole-aspirin (AGGRENOX) 200-25 MG 12hr capsule Take 1 capsule by mouth daily.    ? fluticasone (FLONASE) 50 MCG/ACT nasal spray USE 2 SPRAYS IN EACH NOSTRIL ONCE DAILY. 16 g 5  ? furosemide (LASIX) 20 MG tablet Take 1 tablet (20 mg total) by mouth daily. (Patient taking differently: Take 20 mg by mouth as needed.) 90 tablet 1  ? glucose blood (ONETOUCH ULTRA) test strip CHECK BLOOD SUGER UP TO 3 TIMES A DAY 300 strip 3  ? LANTUS SOLOSTAR 100 UNIT/ML Solostar Pen Inject 30 Units into the skin daily. 15 mL 5  ? LORazepam (ATIVAN) 0.5 MG tablet Take 1 tablet (0.5 mg total) by mouth 2 (two) times daily. 60 tablet 5  ? Magnesium 250 MG TABS Take 250 mg by mouth daily.    ? metoprolol succinate (TOPROL-XL) 25 MG 24 hr tablet Take 1 tablet (25 mg total) by mouth daily. Take with or immediately following a meal. 90 tablet 3  ? olmesartan (BENICAR) 40 MG tablet Take 1 tablet (40 mg total) by mouth daily. 90 tablet 1  ? Omega-3 Fatty Acids (FISH OIL) 1000 MG CAPS Take 1 capsule by mouth daily.    ? pantoprazole (PROTONIX) 40 MG tablet Take 1 tablet (40 mg total) by mouth daily. (NEEDS TO BE SEEN BEFORE NEXT REFILL) 90 tablet 1  ? potassium chloride (KLOR-CON) 10 MEQ tablet Take 1 tablet (10 mEq total) by mouth as directed. Mon, Wed, and Fri only. 90 tablet 1  ? ?No current facility-administered medications for this visit.  ?  ? ?Review of Systems  ?Please see the history of present illness.    ?(+) arthritic pains in legs ?(+) cramping in hands ? ?All other systems reviewed and are otherwise negative except as noted  above. ? ?Physical Exam  ?  ?Wt Readings from Last 3 Encounters:  ?12/25/21 114 lb 4 oz (51.8 kg)  ?12/14/21 114 lb 8 oz (51.9 kg)  ?12/03/21 112 lb (50.8 kg)  ? ?IO:NGEXB were no vitals filed for this visit.,There is no height or weight on file to calculate BMI. ? ?GEN: Well nourished, well developed, in no acute distress. ?Neck: Supple, no JVD, carotid bruits, or masses. ?Cardiac: S1,S2, SB no murmurs, rubs, or gallops. No clubbing, cyanosis, no edema.  ?Radials/PT 2+ and equal bilaterally.  ?Respiratory:  Respirations regular and unlabored, clear to auscultation bilaterally. ?MS: no deformity or atrophy. ?Skin: warm and  dry, no rash. ?Neuro:  Strength and sensation are intact. ?Psych: Normal affect. ? ?EKG/LABS/Other Studies Reviewed  ?  ?ECG personally reviewed by me today -none ordered today  ? ?Risk Assessment/Calculations:   ? ?Lab Results  ?Component Value Date  ? WBC 6.3 12/03/2021  ? HGB 13.5 12/03/2021  ? HCT 41.5 12/03/2021  ? MCV 90 12/03/2021  ? PLT 158 12/03/2021  ? ?Lab Results  ?Component Value Date  ? CREATININE 1.23 (H) 12/14/2021  ? BUN 16 12/14/2021  ? NA 143 12/14/2021  ? K 5.1 12/14/2021  ? CL 103 12/14/2021  ? CO2 25 12/14/2021  ? ?Lab Results  ?Component Value Date  ? ALT 10 12/14/2021  ? AST 18 12/14/2021  ? ALKPHOS 73 12/14/2021  ? BILITOT 0.7 12/14/2021  ? ?Lab Results  ?Component Value Date  ? CHOL 150 12/03/2021  ? HDL 45 12/03/2021  ? [place] 72 12/03/2021  ? TRIG 198 (H) 12/03/2021  ? CHOLHDL 3.3 12/03/2021  ?  ?Lab Results  ?Component Value Date  ? HGBA1C 7.0 (H) 12/03/2021  ? ? ?Assessment & Plan  ?  ?1. HFpEF: ?-Echo completed 2/23 with EF 60-65%, no wall abnormalities, mild mitral regurgitation and mild calcification of aortic valve.  -She reports that her Lasix is no longer being taken.  She was advised to continue her olmesartan 40 mg, and metoprolol 25 mg. ?-Euvolemic and well compensated on exam.  ?-Low sodium diet, fluid restriction <2L, and daily weights encouraged. Educated  to contact our office for weight gain of 2 lbs overnight or 5 lbs in one week. ? ?2 Essential hypertension: ?-BP elevated in clinic today though well controlled 133/57 at recent PCP visit. Encouraged to monitor at home and re

## 2022-01-18 ENCOUNTER — Ambulatory Visit (INDEPENDENT_AMBULATORY_CARE_PROVIDER_SITE_OTHER): Payer: Medicare Other | Admitting: Nurse Practitioner

## 2022-01-18 ENCOUNTER — Encounter (HOSPITAL_BASED_OUTPATIENT_CLINIC_OR_DEPARTMENT_OTHER): Payer: Self-pay | Admitting: Nurse Practitioner

## 2022-01-18 ENCOUNTER — Other Ambulatory Visit: Payer: Self-pay

## 2022-01-18 VITALS — BP 153/64 | HR 53 | Ht 61.0 in | Wt 116.6 lb

## 2022-01-18 DIAGNOSIS — I5032 Chronic diastolic (congestive) heart failure: Secondary | ICD-10-CM | POA: Diagnosis not present

## 2022-01-18 DIAGNOSIS — I10 Essential (primary) hypertension: Secondary | ICD-10-CM | POA: Diagnosis not present

## 2022-01-18 DIAGNOSIS — I6523 Occlusion and stenosis of bilateral carotid arteries: Secondary | ICD-10-CM

## 2022-01-18 NOTE — Patient Instructions (Signed)
Medication Instructions:  ?Continue your current medications.  ? ?*If you need a refill on your cardiac medications before your next appointment, please call your pharmacy* ? ? ?Lab Work: ?None ordered today.  ? ?If you have labs (blood work) drawn today and your tests are completely normal, you will receive your results only by: ?MyChart Message (if you have MyChart) OR ?A paper copy in the mail ?If you have any lab test that is abnormal or we need to change your treatment, we will call you to review the results. ? ? ?Testing/Procedures: ?None ordered today.  ? ?Follow-Up: ?At [facility], you and your health needs are our priority.  As part of our continuing mission to provide you with exceptional heart care, we have created designated Provider Care Teams.  These Care Teams include your primary Cardiologist (physician) and Advanced Practice Providers (APPs -  Physician Assistants and Nurse Practitioners) who all work together to provide you with the care you need, when you need it. ? ?We recommend signing up for the patient portal called "MyChart".  Sign up information is provided on this After Visit Summary.  MyChart is used to connect with patients for Virtual Visits (Telemedicine).  Patients are able to view lab/test results, encounter notes, upcoming appointments, etc.  Non-urgent messages can be sent to your provider as well.   ?To learn more about what you can do with MyChart, go to [email or web address].   ? ?Your next appointment:   ?In September with Dr. Burt Knack ? ? ?Other Instructions ?Recommend increasing your fluid intake to prevent dehydration, cramping, and lightheadedness.  ? ?MyChart Username:[email or web address] ?Password: Welcome ?

## 2022-01-21 ENCOUNTER — Ambulatory Visit (INDEPENDENT_AMBULATORY_CARE_PROVIDER_SITE_OTHER): Payer: Medicare Other | Admitting: Family Medicine

## 2022-01-21 ENCOUNTER — Encounter: Payer: Self-pay | Admitting: Family Medicine

## 2022-01-21 VITALS — BP 153/55 | HR 55 | Temp 97.0°F | Ht 61.0 in | Wt 117.4 lb

## 2022-01-21 DIAGNOSIS — L03116 Cellulitis of left lower limb: Secondary | ICD-10-CM

## 2022-01-21 MED ORDER — AMOXICILLIN-POT CLAVULANATE 875-125 MG PO TABS: 1.0000 | ORAL_TABLET | Freq: Two times a day (BID) | ORAL | 0 refills | Status: DC

## 2022-01-21 NOTE — Progress Notes (Signed)
Chief Complaint  ?Patient presents with  ? Laceration  ?  Left shin on 3/2 dog 'ran' into it  ? ? ?HPI ? ?Patient presents today for cut left leg at shin when a dog ran into her on March 2. Has been scabbed over, but noted redness a few days ago. ? ?PMH: Smoking status noted ?ROS: Per HPI ? ?Objective: ?BP (!) 153/55   Pulse (!) 55   Temp (!) 97 ?F (36.1 ?C) (Oral)   Ht 5' 1"  (1.549 m)   Wt 117 lb 6.4 oz (53.3 kg)   SpO2 100%   BMI 22.18 kg/m?  ?Gen: NAD, alert, cooperative with exam ?HEENT: NCAT, EOMI, PERRL ?CV: RRR, good S1/S2, no murmur ?Resp: CTABL, no wheezes, non-labored ?Ext: No edema, warm. There is erythema around the left shin laceration that is spreading upward. The crusted area is at the distal 1/3 of the shin medially. It measures 4 mm X 4 cm ? ?Neuro: Alert and oriented, No gross deficits ? ?Assessment and plan: ? ?1. Cellulitis of left lower extremity   ? ? ?Meds ordered this encounter  ?Medications  ? amoxicillin-clavulanate (AUGMENTIN) 875-125 MG tablet  ?  Sig: Take 1 tablet by mouth 2 (two) times daily. Take all of this medication  ?  Dispense:  20 tablet  ?  Refill:  0  ? ? ?No orders of the defined types were placed in this encounter. ? ? ?Follow up as needed. ? ?Claretta Fraise, MD ? ? ? ? ?

## 2022-01-24 DIAGNOSIS — M79676 Pain in unspecified toe(s): Secondary | ICD-10-CM | POA: Diagnosis not present

## 2022-01-24 DIAGNOSIS — B351 Tinea unguium: Secondary | ICD-10-CM | POA: Diagnosis not present

## 2022-01-24 DIAGNOSIS — L84 Corns and callosities: Secondary | ICD-10-CM | POA: Diagnosis not present

## 2022-01-24 DIAGNOSIS — E1142 Type 2 diabetes mellitus with diabetic polyneuropathy: Secondary | ICD-10-CM | POA: Diagnosis not present

## 2022-01-31 ENCOUNTER — Ambulatory Visit (INDEPENDENT_AMBULATORY_CARE_PROVIDER_SITE_OTHER): Payer: Medicare Other | Admitting: Licensed Clinical Social Worker

## 2022-01-31 DIAGNOSIS — E119 Type 2 diabetes mellitus without complications: Secondary | ICD-10-CM

## 2022-01-31 DIAGNOSIS — E785 Hyperlipidemia, unspecified: Secondary | ICD-10-CM

## 2022-01-31 DIAGNOSIS — I10 Essential (primary) hypertension: Secondary | ICD-10-CM

## 2022-01-31 DIAGNOSIS — I6930 Unspecified sequelae of cerebral infarction: Secondary | ICD-10-CM

## 2022-01-31 DIAGNOSIS — N1831 Chronic kidney disease, stage 3a: Secondary | ICD-10-CM

## 2022-01-31 DIAGNOSIS — F3342 Major depressive disorder, recurrent, in full remission: Secondary | ICD-10-CM

## 2022-01-31 NOTE — Chronic Care Management (AMB) (Signed)
?Chronic Care Management  ? ? Clinical Social Work Note ? ?01/31/2022 ?Name: Kara Dean MRN: [id number] DOB: 06-23-1933 ? ?Kara Dean is a 88 y.o. year old female who is a primary care patient of Kara Dean, [place]. The CCM team was consulted to assist the patient with chronic disease management and/or care coordination needs related to: [employer] .  ? ?Engaged with patient / daughter of patient, Kara Dean, by telephone for follow up visit in response to provider referral for social work chronic care management and care coordination services.  ? ?Consent to Services:  ?The patient was given information about Chronic Care Management services, agreed to services, and gave verbal consent prior to initiation of services.  Please see initial visit note for detailed documentation.  ? ?Patient agreed to services and consent obtained.  ? ?Assessment: Review of patient past medical history, allergies, medications, and health status, including review of relevant consultants reports was performed today as part of a comprehensive evaluation and provision of chronic care management and care coordination services.    ? ?SDOH (Social Determinants of Health) assessments and interventions performed:  ?SDOH Interventions   ? ?Flowsheet Row Most Recent Value  ?SDOH Interventions   ?Physical Activity Interventions Other (Comments)  [walking challenges. she uses a cane to help her walk]  ?Stress Interventions Other (Comment)  [client has stress related to managing medical needs]  ?Depression Interventions/Treatment  Medication  ? ?  ?  ? ?Advanced Directives Status: See [employer]yn[employer] application for related entries. ? ?CCM Care Plan ? ?Allergies  ?Allergen Reactions  ? Codeine Other (See Comments)  ?  unknown  ? Latex Rash  ? Morphine Other (See Comments)  ?  unknown  ? Niacin Other (See Comments)  ?  unknown  ? Sulfa Antibiotics Rash  ? Sulfonamide Derivatives Rash  ? ? ?Outpatient Encounter Medications as of  01/31/2022  ?Medication Sig  ? amLODipine (NORVASC) 10 MG tablet Take 1 tablet (10 mg total) by mouth daily.  ? amoxicillin-clavulanate (AUGMENTIN) 875-125 MG tablet Take 1 tablet by mouth 2 (two) times daily. Take all of this medication  ? atorvastatin (LIPITOR) 10 MG tablet Take 1 tablet (10 mg total) by mouth daily. (NEEDS TO BE SEEN BEFORE NEXT REFILL)  ? B Complex Vitamins (B COMPLEX-B12) TABS Take 1 tablet by mouth daily.  ? Cholecalciferol (VITAMIN D3 PO) Take 1 capsule by mouth daily.  ? citalopram (CELEXA) 20 MG tablet Take 1 tablet (20 mg total) by mouth daily. (NEEDS TO BE SEEN BEFORE NEXT REFILL)  ? dipyridamole-aspirin (AGGRENOX) 200-25 MG 12hr capsule Take 1 capsule by mouth daily.  ? fluticasone (FLONASE) 50 MCG/ACT nasal spray USE 2 SPRAYS IN EACH NOSTRIL ONCE DAILY.  ? furosemide (LASIX) 20 MG tablet Take 1 tablet (20 mg total) by mouth daily. (Patient taking differently: Take 20 mg by mouth as needed.)  ? glucose blood (ONETOUCH ULTRA) test strip CHECK BLOOD SUGER UP TO 3 TIMES A DAY  ? LANTUS SOLOSTAR 100 UNIT/ML Solostar Pen Inject 30 Units into the skin daily.  ? LORazepam (ATIVAN) 0.5 MG tablet Take 1 tablet (0.5 mg total) by mouth 2 (two) times daily.  ? Magnesium 250 MG TABS Take 250 mg by mouth daily.  ? metoprolol succinate (TOPROL-XL) 25 MG 24 hr tablet Take 1 tablet (25 mg total) by mouth daily. Take with or immediately following a meal.  ? olmesartan (BENICAR) 40 MG tablet Take 1 tablet (40 mg total) by mouth daily.  ? Omega-3 Fatty  Acids (FISH OIL) 1000 MG CAPS Take 1 capsule by mouth daily.  ? pantoprazole (PROTONIX) 40 MG tablet Take 1 tablet (40 mg total) by mouth daily. (NEEDS TO BE SEEN BEFORE NEXT REFILL)  ? potassium chloride (KLOR-CON) 10 MEQ tablet Take 1 tablet (10 mEq total) by mouth as directed. Mon, Wed, and Fri only.  ? ?No facility-administered encounter medications on file as of 01/31/2022.  ? ? ?Patient Active Problem List  ? Diagnosis Date Noted  ? Elevated liver  function tests   ? Choledocholithiasis with acute cholecystitis 05/07/2021  ? Peripheral edema 03/18/2017  ? Chronic kidney disease (CKD) stage G3a/A3, moderately decreased glomerular filtration rate (GFR) between 45-59 mL/min/1.73 square meter and albuminuria creatinine ratio greater than 300 mg/g (HCC) 07/27/2014  ? Gastroesophageal reflux disease without esophagitis 04/27/2014  ? GAD (generalized anxiety disorder) 04/27/2014  ? Late effect of cerebrovascular accident (CVA) 03/26/2010  ? Depression 02/01/2009  ? Essential hypertension 02/01/2009  ? Osteoarthritis 02/01/2009  ? ? ?Conditions to be addressed/monitored: monitor client management of anxiety issues and depression issues ? ?Care Plan : LCSW Care Plan  ?Updates made by Katha Cabal, LCSW since 01/31/2022 12:00 AM  ?  ? ?Problem: Emotional Distress   ?  ? ?Goal: Manage Anxiety and Depession issues faced   ?Start Date: 01/31/2022  ?Expected End Date: 04/30/2022  ?This Visit's Progress: On track  ?Recent Progress: On track  ?Priority: Medium  ?Note:   ?Current Barriers:  ?Chronic Mental Health needs related to management of anxiety and depression issues ?Decreased appetite ?Mobility challenges ?Suicidal Ideation/Homicidal Ideation: No ? ?Clinical Social Work Goal(s):  ?patient will work with SW monthly by telephone or in person to reduce or manage symptoms related to anxiety and depression issues of client ?Patient will attend all scheduled client medical appointments in next 30 days ?Patient will call RNCM or LCSW as needed for CCM support in next 30 days ? ?Interventions: ?1:1 collaboration with Kara Pretty, FNP regarding development and update of comprehensive plan of care as evidenced by provider attestation and co-signature ?Discussed client needs with  Kara Dean, daughter of client ?Reviewed sleeping challenges of client. [name] said client has difficulty sleeping ?Reviewed appetite issues of client. [name] said client has reduced  appetite. [name] said client may drink 1 or 2 Boost supplements per day. Client eats small meals. ?Discussed walking challenges.of client. [name] said client uses a cane to help her walk.  ?LCSW talked with Vickii Chafe about [facility] Dual Complete program. Vickii Chafe was familiar with Dual Complete program. She said client gets U card and uses U card to help buy food. [name] also said that client receives Food Stamps benefit ?Reviewed  in home support for client.[name] said client has strong family support. Client has support from her daughters and from her grandchildren.  ?Discussed housing needs of client.[name] said client was seeking a new residence and was on waiting list at several apartment complexes. LCSW did talk with Vickii Chafe about [facility] in [place], [place].  LCSW gave Vickii Chafe the phone number for [facility].  [name] said that client was looking for a new residence where the client and Vickii Chafe could reside together. ?Encouraged client or Vickii Chafe to call RNCM in next 30 days as needed to discuss nursing needs of client ? ?Patient Self Care Activities:  ?Takes medications as prescribed ?Tries to complete ADLs as able ? ?Patient Coping Strengths:  ?Has family support ?Attends scheduled medical appointments ?Has no transport needs ? ?Patient Self Care Deficits:  ?  Decreased appetite ?Some mobility issues ? ?Patient Goals:  ?- spend time or talk with others every day ?- practice relaxation or meditation daily ?- keep a calendar with appointment dates ? ?Follow Up Plan:  LCSW to call client or daughter, Vickii Chafe, on 03/25/22 at 2:00 PM to assess client needs  ?  ? ?Norva Riffle.Tonette Koehne MSW, LCSW ?Licensed Clinical Social Worker ?[facility] ?[phone] ?

## 2022-01-31 NOTE — Patient Instructions (Addendum)
Visit Information ? ?Patient goals:  Manage My Emotions (Patient).  Manage Anxiety and Depression issues faced ? ?Timeframe:  Short-Term Goal ?Priority:  Medium ?Progress: On Track ?Start Date:           01/31/22                  ?Expected End Date:   04/30/22   ? ?Follow Up Date  03/25/22 at 2:00 PM ?  ?Manage My Emotions (Patient)  Manage Anxiety and Depression issues faced  ?  ?Why is this important?   ?When you are stressed, down or upset, your body reacts too.  ?For example, your blood pressure may get higher; you may have a headache or stomachache.  ?When your emotions get the best of you, your body's ability to fight off cold and flu gets weak.  ?These steps will help you manage your emotions.   ? ?Patient Self Care Activities:  ?Takes medications as prescribed ?Tries to complete ADLs as able ? ?Patient Coping Strengths:  ?Has family support ?Attends scheduled medical appointments ?Has no transport needs ? ?Patient Self Care Deficits:  ?Decreased appetite ?Some mobility issues ? ?Patient Goals:  ?- spend time or talk with others every day ?- practice relaxation or meditation daily ?- keep a calendar with appointment dates ? ?Follow Up Plan:  LCSW to call client or her daughter, Vickii Chafe, on 03/25/22  at 2:00 PM to assess client needs   ? ?Norva Riffle.Riya Huxford MSW, LCSW ?Licensed Clinical Social Worker ?[facility] ?[phone] ?

## 2022-02-01 DIAGNOSIS — E119 Type 2 diabetes mellitus without complications: Secondary | ICD-10-CM

## 2022-02-01 DIAGNOSIS — N1831 Chronic kidney disease, stage 3a: Secondary | ICD-10-CM

## 2022-02-01 DIAGNOSIS — E785 Hyperlipidemia, unspecified: Secondary | ICD-10-CM

## 2022-02-01 DIAGNOSIS — Z794 Long term (current) use of insulin: Secondary | ICD-10-CM

## 2022-02-01 DIAGNOSIS — I10 Essential (primary) hypertension: Secondary | ICD-10-CM

## 2022-02-01 DIAGNOSIS — F3342 Major depressive disorder, recurrent, in full remission: Secondary | ICD-10-CM

## 2022-02-20 ENCOUNTER — Ambulatory Visit (INDEPENDENT_AMBULATORY_CARE_PROVIDER_SITE_OTHER): Payer: Medicare Other

## 2022-02-20 VITALS — Wt 113.0 lb

## 2022-02-20 DIAGNOSIS — Z0000 Encounter for general adult medical examination without abnormal findings: Secondary | ICD-10-CM | POA: Diagnosis not present

## 2022-02-20 NOTE — Progress Notes (Addendum)
? ?Subjective:  ? Kara Dean is a 88 y.o. female who presents for Medicare Annual (Subsequent) preventive examination. ? ?Virtual Visit via Telephone Note ? ?I connected with  [name] on 02/20/22 at 11:15 AM EDT by telephone and verified that I am speaking with the correct person using two identifiers. ? ?Location: ?Patient: Home ?Provider: [facility] ?Persons participating in the virtual visit: patient/Nurse Health Advisor ?  ?I discussed the limitations, risks, security and privacy concerns of performing an evaluation and management service by telephone and the availability of in person appointments. The patient expressed understanding and agreed to proceed. ? ?Interactive audio and video telecommunications were attempted between this nurse and patient, however failed, due to patient having technical difficulties OR patient did not have access to video capability.  We continued and completed visit with audio only. ? ?Some vital signs may be absent or patient reported.  ? ?Kamsiyochukwu Spickler Dionne Ano, LPN  ? ?Review of Systems    ? ?Cardiac Risk Factors include: advanced age (>10mn, >>10women);diabetes mellitus;dyslipidemia;hypertension;sedentary lifestyle;Other (see comment), Risk factor comments: hx of CVA ? ?   ?Objective:  ?  ?Today's Vitals  ? 02/20/22 1120  ?Weight: 113 lb (51.3 kg)  ? ?Body mass index is 21.35 kg/m?. ? ? ?  02/20/2022  ? 11:38 AM 05/06/2021  ?  8:31 PM 02/19/2021  ? 10:33 AM 02/17/2020  ? 10:11 AM 01/10/2018  ?  1:13 AM 01/09/2018  ?  5:19 PM 10/10/2017  ?  7:17 AM  ?Advanced Directives  ?Does Patient Have a Medical Advance Directive? No No No No No No No  ?Would patient like information on creating a medical advance directive? No - Patient declined Yes (ED - Information included in AVS) Yes (MAU/Ambulatory/Procedural Areas - Information given) No - Patient declined Yes (Inpatient - patient requests chaplain consult to create a medical advance directive);Yes (Inpatient - patient defers creating a medical  advance directive at this time)  No - Patient declined  ? ? ?Current Medications (verified) ?Outpatient Encounter Medications as of 02/20/2022  ?Medication Sig  ? amLODipine (NORVASC) 10 MG tablet Take 1 tablet (10 mg total) by mouth daily.  ? atorvastatin (LIPITOR) 10 MG tablet Take 1 tablet (10 mg total) by mouth daily. (NEEDS TO BE SEEN BEFORE NEXT REFILL)  ? B Complex Vitamins (B COMPLEX-B12) TABS Take 1 tablet by mouth daily.  ? Cholecalciferol (VITAMIN D3 PO) Take 1 capsule by mouth daily.  ? citalopram (CELEXA) 20 MG tablet Take 1 tablet (20 mg total) by mouth daily. (NEEDS TO BE SEEN BEFORE NEXT REFILL)  ? dipyridamole-aspirin (AGGRENOX) 200-25 MG 12hr capsule Take 1 capsule by mouth daily.  ? fluticasone (FLONASE) 50 MCG/ACT nasal spray USE 2 SPRAYS IN EACH NOSTRIL ONCE DAILY.  ? furosemide (LASIX) 20 MG tablet Take 1 tablet (20 mg total) by mouth daily. (Patient taking differently: Take 20 mg by mouth as needed.)  ? glucose blood (ONETOUCH ULTRA) test strip CHECK BLOOD SUGER UP TO 3 TIMES A DAY  ? LANTUS SOLOSTAR 100 UNIT/ML Solostar Pen Inject 30 Units into the skin daily.  ? LORazepam (ATIVAN) 0.5 MG tablet Take 1 tablet (0.5 mg total) by mouth 2 (two) times daily.  ? Magnesium 250 MG TABS Take 250 mg by mouth daily.  ? metoprolol succinate (TOPROL-XL) 25 MG 24 hr tablet Take 1 tablet (25 mg total) by mouth daily. Take with or immediately following a meal.  ? olmesartan (BENICAR) 40 MG tablet Take 1 tablet (40 mg  total) by mouth daily.  ? Omega-3 Fatty Acids (FISH OIL) 1000 MG CAPS Take 1 capsule by mouth daily.  ? pantoprazole (PROTONIX) 40 MG tablet Take 1 tablet (40 mg total) by mouth daily. (NEEDS TO BE SEEN BEFORE NEXT REFILL)  ? potassium chloride (KLOR-CON) 10 MEQ tablet Take 1 tablet (10 mEq total) by mouth as directed. Mon, Wed, and Fri only.  ? [DISCONTINUED] amoxicillin-clavulanate (AUGMENTIN) 875-125 MG tablet Take 1 tablet by mouth 2 (two) times daily. Take all of this medication (Patient not  taking: Reported on 02/20/2022)  ? ?No facility-administered encounter medications on file as of 02/20/2022.  ? ? ?Allergies (verified) ?Codeine, Latex, Morphine, Niacin, Sulfa antibiotics, and Sulfonamide derivatives  ? ?History: ?Past Medical History:  ?Diagnosis Date  ? Anxiety   ? Carotid artery disease ([place])   ? Carotid US 11/2018:  bilateral ICA 1-39 >> repeat 2 years.   ? Cataract   ? Crohn's colitis ([place])   ? CVA (cerebral infarction)   ? No deficits  ? Depression   ? Diabetes mellitus   ? type 2  ? Dyslipidemia   ? GERD (gastroesophageal reflux disease)   ? History of TIAs   ? no deficits  ? Hyperlipidemia   ? Hypertension   ? Hyponatremia   ? Nodular basal cell carcinoma (BCC) 04/22/2018  ? left prox, nasal root (tx p bx)  ? Osteoarthritis   ? SCC (squamous cell carcinoma) 04/22/2018  ? left shin,superior-(tx p bx), Left shin,inferior-(tx p bx )  ? ?Past Surgical History:  ?Procedure Laterality Date  ? ABDOMINAL HYSTERECTOMY    ? BACK SURGERY  1994  ? CATARACT EXTRACTION W/PHACO Left 09/19/2017  ? Procedure: CATARACT EXTRACTION PHACO AND INTRAOCULAR LENS PLACEMENT (IOC);  Surgeon: Baruch Goldmann, MD;  Location: [facility];  Service: Ophthalmology;  Laterality: Left;  CDE: 8.10  ? CATARACT EXTRACTION W/PHACO Right 10/10/2017  ? Procedure: CATARACT EXTRACTION PHACO AND INTRAOCULAR LENS PLACEMENT RIGHT EYE;  Surgeon: Baruch Goldmann, MD;  Location: [facility];  Service: Ophthalmology;  Laterality: Right;  CDE: 9.60  ? CHOLECYSTECTOMY N/A 05/10/2021  ? Procedure: LAPAROSCOPIC CHOLECYSTECTOMY;  Surgeon: Clovis Riley, MD;  Location: [facility];  Service: General;  Laterality: N/A;  ? COLON RESECTION  1990's  ? TUBAL LIGATION    ? VESICOVAGINAL FISTULA CLOSURE W/ TAH  1984  ? ?Family History  ?Problem Relation Age of Onset  ? Coronary artery disease Sister   ?     stent x 3  ? Seizures Sister   ? Coronary artery disease Brother   ?     stent  ? Hypertension Mother   ? Stroke Mother   ? Emphysema Father   ? Alcohol abuse Brother    ? Lung disease Brother   ? Heart attack Daughter   ? ?Social History  ? ?Socioeconomic History  ? Marital status: Widowed  ?  Spouse name: Not on file  ? Number of children: 6  ? Years of education: 10  ? Highest education level: 10th grade  ?Occupational History  ? Occupation: Retired  ?  Comment: [employer]  ?Tobacco Use  ? Smoking status: Never  ?  Passive exposure: Current  ? Smokeless tobacco: Never  ?Vaping Use  ? Vaping Use: Never used  ?Substance and Sexual Activity  ? Alcohol use: No  ? Drug use: No  ? Sexual activity: Not Currently  ?Other Topics Concern  ? Not on file  ?Social History Narrative  ? Mrs Canal is retired and  widowed. She lives at home and her granddaughter lives with her. She lives within walking distance to her 2 daughter's houses. Has an indoor cat. Her sister moved to [place] and she doesn't get to get to visit with her often but does talk with her on the phone. She has 6 children and many grandchildren and great grandchildren.   ? ?Social Determinants of Health  ? ?Financial Resource Strain: Low Risk   ? Difficulty of Paying Living Expenses: Not hard at all  ?Food Insecurity: No Food Insecurity  ? Worried About Charity fundraiser in the Last Year: Never true  ? Ran Out of Food in the Last Year: Never true  ?Transportation Needs: No Transportation Needs  ? Lack of Transportation (Medical): No  ? Lack of Transportation (Non-Medical): No  ?Physical Activity: Insufficiently Active  ? Days of Exercise per Week: 7 days  ? Minutes of Exercise per Session: 10 min  ?Stress: Stress Concern Present  ? Feeling of Stress : To some extent  ?Social Connections: Socially Isolated  ? Frequency of Communication with Friends and Family: More than three times a week  ? Frequency of Social Gatherings with Friends and Family: Twice a week  ? Attends Religious Services: Never  ? Active Member of Clubs or Organizations: No  ? Attends Archivist Meetings: Never  ? Marital Status: Widowed  ? ? ?Tobacco  Counseling ?Counseling given: Not Answered ? ? ?Clinical Intake: ? ?Pre-visit preparation completed: Yes ? ?Pain : No/denies pain ? ?  ? ?BMI - recorded: 21.35 ?Nutritional Status: BMI of 19-24  Normal

## 2022-02-20 NOTE — Patient Instructions (Signed)
Kara Dean , ?Thank you for taking time to come for your Medicare Wellness Visit. I appreciate your ongoing commitment to your health goals. Please review the following plan we discussed and let me know if I can assist you in the future.  ? ?Screening recommendations/referrals: ?Colonoscopy: No longer required ?Mammogram: no longer required ?Bone Density: no longer required ?Recommended yearly ophthalmology/optometry visit for glaucoma screening and checkup ?Recommended yearly dental visit for hygiene and checkup ? ?Vaccinations: ?Influenza vaccine: Done 07/31/2021 - Repeat annually  ?Pneumococcal vaccine: Done 07/18/2014 & 08/07/2018  ?Tdap vaccine: Done 08/15/2014 - Repeat in 10 years ?Shingles vaccine: Done 10/05/2019 & 05/01/2021     ?Covid-19: Done 03/20/2020 & 04/17/2020 ? ?Advanced directives: Advance directive discussed with you today. Even though you declined this today, please call our office should you change your mind, and we can give you the proper paperwork for you to fill out.  ? ?Conditions/risks identified: Aim for 30 minutes of exercise or brisk walking, 6-8 glasses of water, and 5 servings of fruits and vegetables each day.  ? ?Next appointment: Follow up in one year for your annual wellness visit  ? ? ?Preventive Care 35 Years and Older, Female ?Preventive care refers to lifestyle choices and visits with your health care provider that can promote health and wellness. ?What does preventive care include? ?A yearly physical exam. This is also called an annual well check. ?Dental exams once or twice a year. ?Routine eye exams. Ask your health care provider how often you should have your eyes checked. ?Personal lifestyle choices, including: ?Daily care of your teeth and gums. ?Regular physical activity. ?Eating a healthy diet. ?Avoiding tobacco and drug use. ?Limiting alcohol use. ?Practicing safe sex. ?Taking low-dose aspirin every day. ?Taking vitamin and mineral supplements as recommended by your health  care provider. ?What happens during an annual well check? ?The services and screenings done by your health care provider during your annual well check will depend on your age, overall health, lifestyle risk factors, and family history of disease. ?Counseling  ?Your health care provider may ask you questions about your: ?Alcohol use. ?Tobacco use. ?Drug use. ?Emotional well-being. ?Home and relationship well-being. ?Sexual activity. ?Eating habits. ?History of falls. ?Memory and ability to understand (cognition). ?Work and work Statistician. ?Reproductive health. ?Screening  ?You may have the following tests or measurements: ?Height, weight, and BMI. ?Blood pressure. ?Lipid and cholesterol levels. These may be checked every 5 years, or more frequently if you are over 72 years old. ?Skin check. ?Lung cancer screening. You may have this screening every year starting at age 90 if you have a 30-pack-year history of smoking and currently smoke or have quit within the past 15 years. ?Fecal occult blood test (FOBT) of the stool. You may have this test every year starting at age 55. ?Flexible sigmoidoscopy or colonoscopy. You may have a sigmoidoscopy every 5 years or a colonoscopy every 10 years starting at age 62. ?Hepatitis C blood test. ?Hepatitis B blood test. ?Sexually transmitted disease (STD) testing. ?Diabetes screening. This is done by checking your blood sugar (glucose) after you have not eaten for a while (fasting). You may have this done every 1-3 years. ?Bone density scan. This is done to screen for osteoporosis. You may have this done starting at age 68. ?Mammogram. This may be done every 1-2 years. Talk to your health care provider about how often you should have regular mammograms. ?Talk with your health care provider about your test results, treatment options, and if  necessary, the need for more tests. ?Vaccines  ?Your health care provider may recommend certain vaccines, such as: ?Influenza vaccine. This is  recommended every year. ?Tetanus, diphtheria, and acellular pertussis (Tdap, Td) vaccine. You may need a Td booster every 10 years. ?Zoster vaccine. You may need this after age 44. ?Pneumococcal 13-valent conjugate (PCV13) vaccine. One dose is recommended after age 40. ?Pneumococcal polysaccharide (PPSV23) vaccine. One dose is recommended after age 5. ?Talk to your health care provider about which screenings and vaccines you need and how often you need them. ?This information is not intended to replace advice given to you by your health care provider. Make sure you discuss any questions you have with your health care provider. ?Document Released: 11/17/2015 Document Revised: 07/10/2016 Document Reviewed: 08/22/2015 ?Elsevier Interactive Patient Education ? 2017 [place]. ? ?Fall Prevention in the Home ?Falls can cause injuries. They can happen to people of all ages. There are many things you can do to make your home safe and to help prevent falls. ?What can I do on the outside of my home? ?Regularly fix the edges of walkways and driveways and fix any cracks. ?Remove anything that might make you trip as you walk through a door, such as a raised step or threshold. ?Trim any bushes or trees on the path to your home. ?Use bright outdoor lighting. ?Clear any walking paths of anything that might make someone trip, such as rocks or tools. ?Regularly check to see if handrails are loose or broken. Make sure that both sides of any steps have handrails. ?Any raised decks and porches should have guardrails on the edges. ?Have any leaves, snow, or ice cleared regularly. ?Use sand or salt on walking paths during winter. ?Clean up any spills in your garage right away. This includes oil or grease spills. ?What can I do in the bathroom? ?Use night lights. ?Install grab bars by the toilet and in the tub and shower. Do not use towel bars as grab bars. ?Use non-skid mats or decals in the tub or shower. ?If you need to sit down in  the shower, use a plastic, non-slip stool. ?Keep the floor dry. Clean up any water that spills on the floor as soon as it happens. ?Remove soap buildup in the tub or shower regularly. ?Attach bath mats securely with double-sided non-slip rug tape. ?Do not have throw rugs and other things on the floor that can make you trip. ?What can I do in the bedroom? ?Use night lights. ?Make sure that you have a light by your bed that is easy to reach. ?Do not use any sheets or blankets that are too big for your bed. They should not hang down onto the floor. ?Have a firm chair that has side arms. You can use this for support while you get dressed. ?Do not have throw rugs and other things on the floor that can make you trip. ?What can I do in the kitchen? ?Clean up any spills right away. ?Avoid walking on wet floors. ?Keep items that you use a lot in easy-to-reach places. ?If you need to reach something above you, use a strong step stool that has a grab bar. ?Keep electrical cords out of the way. ?Do not use floor polish or wax that makes floors slippery. If you must use wax, use non-skid floor wax. ?Do not have throw rugs and other things on the floor that can make you trip. ?What can I do with my stairs? ?Do not leave any items  on the stairs. ?Make sure that there are handrails on both sides of the stairs and use them. Fix handrails that are broken or loose. Make sure that handrails are as long as the stairways. ?Check any carpeting to make sure that it is firmly attached to the stairs. Fix any carpet that is loose or worn. ?Avoid having throw rugs at the top or bottom of the stairs. If you do have throw rugs, attach them to the floor with carpet tape. ?Make sure that you have a light switch at the top of the stairs and the bottom of the stairs. If you do not have them, ask someone to add them for you. ?What else can I do to help prevent falls? ?Wear shoes that: ?Do not have high heels. ?Have rubber bottoms. ?Are comfortable  and fit you well. ?Are closed at the toe. Do not wear sandals. ?If you use a stepladder: ?Make sure that it is fully opened. Do not climb a closed stepladder. ?Make sure that both sides of the stepladder

## 2022-03-05 ENCOUNTER — Ambulatory Visit (INDEPENDENT_AMBULATORY_CARE_PROVIDER_SITE_OTHER): Payer: Medicare Other | Admitting: Nurse Practitioner

## 2022-03-05 ENCOUNTER — Other Ambulatory Visit: Payer: Self-pay

## 2022-03-05 ENCOUNTER — Encounter: Payer: Self-pay | Admitting: Nurse Practitioner

## 2022-03-05 ENCOUNTER — Other Ambulatory Visit: Payer: Self-pay | Admitting: Nurse Practitioner

## 2022-03-05 VITALS — BP 122/62 | HR 57 | Temp 98.3°F | Resp 20 | Ht 61.0 in | Wt 118.0 lb

## 2022-03-05 DIAGNOSIS — F3342 Major depressive disorder, recurrent, in full remission: Secondary | ICD-10-CM

## 2022-03-05 DIAGNOSIS — E785 Hyperlipidemia, unspecified: Secondary | ICD-10-CM

## 2022-03-05 DIAGNOSIS — R609 Edema, unspecified: Secondary | ICD-10-CM | POA: Diagnosis not present

## 2022-03-05 DIAGNOSIS — K219 Gastro-esophageal reflux disease without esophagitis: Secondary | ICD-10-CM

## 2022-03-05 DIAGNOSIS — R7989 Other specified abnormal findings of blood chemistry: Secondary | ICD-10-CM | POA: Diagnosis not present

## 2022-03-05 DIAGNOSIS — R600 Localized edema: Secondary | ICD-10-CM

## 2022-03-05 DIAGNOSIS — N1831 Chronic kidney disease, stage 3a: Secondary | ICD-10-CM | POA: Diagnosis not present

## 2022-03-05 DIAGNOSIS — F411 Generalized anxiety disorder: Secondary | ICD-10-CM

## 2022-03-05 DIAGNOSIS — E119 Type 2 diabetes mellitus without complications: Secondary | ICD-10-CM

## 2022-03-05 DIAGNOSIS — I10 Essential (primary) hypertension: Secondary | ICD-10-CM | POA: Diagnosis not present

## 2022-03-05 DIAGNOSIS — Z794 Long term (current) use of insulin: Secondary | ICD-10-CM | POA: Diagnosis not present

## 2022-03-05 DIAGNOSIS — Z79891 Long term (current) use of opiate analgesic: Secondary | ICD-10-CM | POA: Diagnosis not present

## 2022-03-05 DIAGNOSIS — I6930 Unspecified sequelae of cerebral infarction: Secondary | ICD-10-CM | POA: Diagnosis not present

## 2022-03-05 LAB — BAYER DCA HB A1C WAIVED: HB A1C (BAYER DCA - WAIVED): 6.5 % — ABNORMAL HIGH (ref 4.8–5.6)

## 2022-03-05 MED ORDER — ATORVASTATIN CALCIUM 10 MG PO TABS: 10.0000 mg | ORAL_TABLET | Freq: Every day | ORAL | 1 refills | Status: DC

## 2022-03-05 MED ORDER — FLUTICASONE PROPIONATE 50 MCG/ACT NA SUSP: 2.0000 | Freq: Every day | NASAL | 5 refills | Status: DC

## 2022-03-05 MED ORDER — FUROSEMIDE 20 MG PO TABS: 20.0000 mg | ORAL_TABLET | Freq: Every day | ORAL | 1 refills | Status: DC

## 2022-03-05 MED ORDER — CITALOPRAM HYDROBROMIDE 20 MG PO TABS: 20.0000 mg | ORAL_TABLET | Freq: Every day | ORAL | 1 refills | Status: DC

## 2022-03-05 MED ORDER — METOPROLOL SUCCINATE ER 25 MG PO TB24: 25.0000 mg | ORAL_TABLET | Freq: Every day | ORAL | 1 refills | Status: DC

## 2022-03-05 MED ORDER — PANTOPRAZOLE SODIUM 40 MG PO TBEC: 40.0000 mg | DELAYED_RELEASE_TABLET | Freq: Every day | ORAL | 1 refills | Status: DC

## 2022-03-05 MED ORDER — POTASSIUM CHLORIDE ER 10 MEQ PO TBCR: 10.0000 meq | EXTENDED_RELEASE_TABLET | ORAL | 1 refills | Status: DC

## 2022-03-05 MED ORDER — LORAZEPAM 0.5 MG PO TABS: 0.5000 mg | ORAL_TABLET | Freq: Two times a day (BID) | ORAL | 5 refills | Status: DC

## 2022-03-05 MED ORDER — OLMESARTAN MEDOXOMIL 40 MG PO TABS: 40.0000 mg | ORAL_TABLET | Freq: Every day | ORAL | 1 refills | Status: DC

## 2022-03-05 MED ORDER — AMLODIPINE BESYLATE 10 MG PO TABS: 10.0000 mg | ORAL_TABLET | Freq: Every day | ORAL | 1 refills | Status: DC

## 2022-03-05 MED ORDER — ASPIRIN-DIPYRIDAMOLE ER 25-200 MG PO CP12: 1.0000 | ORAL_CAPSULE | Freq: Every day | ORAL | 1 refills | Status: DC

## 2022-03-05 NOTE — Patient Instructions (Signed)

## 2022-03-05 NOTE — Progress Notes (Signed)
? ?Subjective:  ? ? Patient ID: Kara Dean, female    DOB: 10-Apr-1933, 88 y.o.   MRN: [id number] ? ? ?Chief Complaint: Medical Management of Chronic Issues ?  ? ?HPI: ? ?Kara Dean is a 88 y.o. who identifies as a female who was assigned female at birth.  ? ?Social history: ?Lives with: lives with her youngest daughter ?Work history: retired ? ? ?Comes in today for follow up of the following chronic medical issues: ? ?1. Essential hypertension ?No c/o chest pain, sob or headache. Does check blood pressure at home. Runs in 383 systolic consistently at home ?BP Readings from Last 3 Encounters:  ?03/05/22 (!) 164/59  ?01/21/22 (!) 153/55  ?01/18/22 (!) 153/64  ? ? ? ?2. Type 2 diabetes mellitus treated with insulin ([place]) ?Fasting blood sugars  are running from 90-130. ?Lab Results  ?Component Value Date  ? HGBA1C 7.0 (H) 12/03/2021  ? ? ? ?3. Hyperlipidemia, unspecified hyperlipidemia type ?does not watch diet and does little to no exercise. ?Lab Results  ?Component Value Date  ? CHOL 150 12/03/2021  ? HDL 45 12/03/2021  ? [place] 72 12/03/2021  ? TRIG 198 (H) 12/03/2021  ? CHOLHDL 3.3 12/03/2021  ? ? ? ? ?4. GAD (generalized anxiety disorder) ?Is on ativan BID. Is doing well ? ?  03/05/2022  ?  3:39 PM 12/03/2021  ?  2:17 PM 11/02/2021  ? 10:10 AM 05/01/2021  ? 11:11 AM  ?GAD 7 : Generalized Anxiety Score  ?Nervous, Anxious, on Edge 1 1 2 1   ?Control/stop worrying 3 1 2 1   ?Worry too much - different things 3 1 2 1   ?Trouble relaxing 2 1 1 1   ?Restless 0 0 0 0  ?Easily annoyed or irritable 0 1 2 0  ?Afraid - awful might happen 2 0 1 1  ?Total GAD 7 Score 11 5 10 5   ?Anxiety Difficulty Somewhat difficult Not difficult at all Somewhat difficult Not difficult at all  ? ? ? ? ?5. Gastroesophageal reflux disease without esophagitis ?Is on protonix daily and s doing well. ? ?6. Late effect of cerebrovascular accident (CVA) ?No permanent effects ? ?7. Peripheral edema ?Has occasioanl edema. ? ?8. Chronic kidney disease  (CKD) stage G3a/A3, moderately decreased glomerular filtration rate (GFR) between 45-59 mL/min/1.73 square meter and albuminuria creatinine ratio greater than 300 mg/g (HCC) ?Lab Results  ?Component Value Date  ? CREATININE 1.23 (H) 12/14/2021  ? ? ? ?9. Recurrent major depressive disorder, in full remission ([place]) ?Is on celexa and is doing well. No medication side effects ? ?  03/05/2022  ?  3:38 PM 02/20/2022  ? 11:38 AM 01/31/2022  ? 11:15 AM  ?Depression screen PHQ 2/9  ?Decreased Interest 0 1 1  ?Down, Depressed, Hopeless 1 1 1   ?PHQ - 2 Score 1 2 2   ?Altered sleeping 2 1 1   ?Tired, decreased energy 3 2 2   ?Change in appetite 0 1 1  ?Feeling bad or failure about yourself  2 1 1   ?Trouble concentrating 0 1 1  ?Moving slowly or fidgety/restless 0 1 1  ?Suicidal thoughts 0 0 0  ?PHQ-9 Score 8 9 9   ?Difficult doing work/chores Not difficult at all Somewhat difficult Somewhat difficult  ? ? ? ?10. Elevated liver function tests ?Lab Results  ?Component Value Date  ? ALT 10 12/14/2021  ? AST 18 12/14/2021  ? ALKPHOS 73 12/14/2021  ? BILITOT 0.7 12/14/2021  ? ? ? ? ?New complaints: ?None today ? ?  Allergies  ?Allergen Reactions  ? Codeine Other (See Comments)  ?  unknown  ? Latex Rash  ? Morphine Other (See Comments)  ?  unknown  ? Niacin Other (See Comments)  ?  unknown  ? Sulfa Antibiotics Rash  ? Sulfonamide Derivatives Rash  ? ?Outpatient Encounter Medications as of 03/05/2022  ?Medication Sig  ? amLODipine (NORVASC) 10 MG tablet Take 1 tablet (10 mg total) by mouth daily.  ? atorvastatin (LIPITOR) 10 MG tablet Take 1 tablet (10 mg total) by mouth daily. (NEEDS TO BE SEEN BEFORE NEXT REFILL)  ? B Complex Vitamins (B COMPLEX-B12) TABS Take 1 tablet by mouth daily.  ? Cholecalciferol (VITAMIN D3 PO) Take 1 capsule by mouth daily.  ? citalopram (CELEXA) 20 MG tablet Take 1 tablet (20 mg total) by mouth daily. (NEEDS TO BE SEEN BEFORE NEXT REFILL)  ? dipyridamole-aspirin (AGGRENOX) 200-25 MG 12hr capsule Take 1 capsule by  mouth daily.  ? furosemide (LASIX) 20 MG tablet Take 1 tablet (20 mg total) by mouth daily. (Patient taking differently: Take 20 mg by mouth as needed.)  ? glucose blood (ONETOUCH ULTRA) test strip CHECK BLOOD SUGER UP TO 3 TIMES A DAY  ? LANTUS SOLOSTAR 100 UNIT/ML Solostar Pen Inject 30 Units into the skin daily.  ? LORazepam (ATIVAN) 0.5 MG tablet Take 1 tablet (0.5 mg total) by mouth 2 (two) times daily.  ? Magnesium 250 MG TABS Take 250 mg by mouth daily.  ? metoprolol succinate (TOPROL-XL) 25 MG 24 hr tablet Take 1 tablet (25 mg total) by mouth daily. Take with or immediately following a meal.  ? olmesartan (BENICAR) 40 MG tablet Take 1 tablet (40 mg total) by mouth daily.  ? Omega-3 Fatty Acids (FISH OIL) 1000 MG CAPS Take 1 capsule by mouth daily.  ? pantoprazole (PROTONIX) 40 MG tablet Take 1 tablet (40 mg total) by mouth daily. (NEEDS TO BE SEEN BEFORE NEXT REFILL)  ? potassium chloride (KLOR-CON) 10 MEQ tablet Take 1 tablet (10 mEq total) by mouth as directed. Mon, Wed, and Fri only.  ? [DISCONTINUED] fluticasone (FLONASE) 50 MCG/ACT nasal spray USE 2 SPRAYS IN EACH NOSTRIL ONCE DAILY.  ? ?No facility-administered encounter medications on file as of 03/05/2022.  ? ? ?Past Surgical History:  ?Procedure Laterality Date  ? ABDOMINAL HYSTERECTOMY    ? BACK SURGERY  1994  ? CATARACT EXTRACTION W/PHACO Left 09/19/2017  ? Procedure: CATARACT EXTRACTION PHACO AND INTRAOCULAR LENS PLACEMENT (IOC);  Surgeon: Baruch Goldmann, MD;  Location: [facility];  Service: Ophthalmology;  Laterality: Left;  CDE: 8.10  ? CATARACT EXTRACTION W/PHACO Right 10/10/2017  ? Procedure: CATARACT EXTRACTION PHACO AND INTRAOCULAR LENS PLACEMENT RIGHT EYE;  Surgeon: Baruch Goldmann, MD;  Location: [facility];  Service: Ophthalmology;  Laterality: Right;  CDE: 9.60  ? CHOLECYSTECTOMY N/A 05/10/2021  ? Procedure: LAPAROSCOPIC CHOLECYSTECTOMY;  Surgeon: Clovis Riley, MD;  Location: [facility];  Service: General;  Laterality: N/A;  ? COLON RESECTION  1990's   ? TUBAL LIGATION    ? VESICOVAGINAL FISTULA CLOSURE W/ TAH  1984  ? ? ?Family History  ?Problem Relation Age of Onset  ? Coronary artery disease Sister   ?     stent x 3  ? Seizures Sister   ? Coronary artery disease Brother   ?     stent  ? Hypertension Mother   ? Stroke Mother   ? Emphysema Father   ? Alcohol abuse Brother   ? Lung disease Brother   ?  Heart attack Daughter   ? ? ? ? ?Controlled substance contract: 12/05/21 ? ? ? ? ?Review of Systems  ?Constitutional:  Negative for diaphoresis.  ?Eyes:  Negative for pain.  ?Respiratory:  Negative for shortness of breath.   ?Cardiovascular:  Negative for chest pain, palpitations and leg swelling.  ?Gastrointestinal:  Negative for abdominal pain.  ?Endocrine: Negative for polydipsia.  ?Skin:  Negative for rash.  ?Neurological:  Negative for dizziness, weakness and headaches.  ?Hematological:  Does not bruise/bleed easily.  ?All other systems reviewed and are negative. ? ?   ?Objective:  ? Physical Exam ?Vitals and nursing note reviewed.  ?Constitutional:   ?   General: She is not in acute distress. ?   Appearance: Normal appearance. She is well-developed.  ?HENT:  ?   Head: Normocephalic.  ?   Right Ear: Tympanic membrane normal.  ?   Left Ear: Tympanic membrane normal.  ?   Nose: Nose normal.  ?   Mouth/Throat:  ?   Mouth: Mucous membranes are moist.  ?Eyes:  ?   Pupils: Pupils are equal, round, and reactive to light.  ?Neck:  ?   Vascular: No carotid bruit or JVD.  ?Cardiovascular:  ?   Rate and Rhythm: Normal rate and regular rhythm.  ?   Heart sounds: Normal heart sounds.  ?Pulmonary:  ?   Effort: Pulmonary effort is normal. No respiratory distress.  ?   Breath sounds: Normal breath sounds. No wheezing or rales.  ?Chest:  ?   Chest wall: No tenderness.  ?Abdominal:  ?   General: Bowel sounds are normal. There is no distension or abdominal bruit.  ?   Palpations: Abdomen is soft. There is no hepatomegaly, splenomegaly, mass or pulsatile mass.  ?   Tenderness: There  is no abdominal tenderness.  ?Musculoskeletal:     ?   General: Normal range of motion.  ?   Cervical back: Normal range of motion and neck supple.  ?Lymphadenopathy:  ?   Cervical: No cervical adenopat

## 2022-03-06 LAB — CMP14+EGFR
ALT: 10 IU/L (ref 0–32)
AST: 17 IU/L (ref 0–40)
Albumin/Globulin Ratio: 1.6 (ref 1.2–2.2)
Albumin: 4.6 g/dL (ref 3.6–4.6)
Alkaline Phosphatase: 72 IU/L (ref 44–121)
BUN/Creatinine Ratio: 14 (ref 12–28)
BUN: 17 mg/dL (ref 8–27)
Bilirubin Total: 0.6 mg/dL (ref 0.0–1.2)
CO2: 25 mmol/L (ref 20–29)
Calcium: 10.3 mg/dL (ref 8.7–10.3)
Chloride: 102 mmol/L (ref 96–106)
Creatinine, Ser: 1.18 mg/dL — ABNORMAL HIGH (ref 0.57–1.00)
Globulin, Total: 2.8 g/dL (ref 1.5–4.5)
Glucose: 137 mg/dL — ABNORMAL HIGH (ref 70–99)
Potassium: 4.1 mmol/L (ref 3.5–5.2)
Sodium: 141 mmol/L (ref 134–144)
Total Protein: 7.4 g/dL (ref 6.0–8.5)
eGFR: 45 mL/min/{1.73_m2} — ABNORMAL LOW (ref >59)

## 2022-03-06 LAB — LIPID PANEL
Chol/HDL Ratio: 2.6 ratio (ref 0.0–4.4)
Cholesterol, Total: 124 mg/dL (ref 100–199)
HDL: 48 mg/dL (ref >39)
LDL Chol Calc (NIH): 54 mg/dL (ref 0–99)
Triglycerides: 123 mg/dL (ref 0–149)
VLDL Cholesterol Cal: 22 mg/dL (ref 5–40)

## 2022-03-06 LAB — CBC WITH DIFFERENTIAL/PLATELET
Basophils Absolute: 0 10*3/uL (ref 0.0–0.2)
Basos: 1 %
EOS (ABSOLUTE): 0.3 10*3/uL (ref 0.0–0.4)
Eos: 4 %
Hematocrit: 38.6 % (ref 34.0–46.6)
Hemoglobin: 12.8 g/dL (ref 11.1–15.9)
Immature Grans (Abs): 0 10*3/uL (ref 0.0–0.1)
Immature Granulocytes: 0 %
Lymphocytes Absolute: 1.4 10*3/uL (ref 0.7–3.1)
Lymphs: 17 %
MCH: 30.3 pg (ref 26.6–33.0)
MCHC: 33.2 g/dL (ref 31.5–35.7)
MCV: 91 fL (ref 79–97)
Monocytes Absolute: 0.7 10*3/uL (ref 0.1–0.9)
Monocytes: 8 %
Neutrophils Absolute: 5.7 10*3/uL (ref 1.4–7.0)
Neutrophils: 70 %
Platelets: 206 10*3/uL (ref 150–450)
RBC: 4.23 x10E6/uL (ref 3.77–5.28)
RDW: 13 % (ref 11.7–15.4)
WBC: 8.2 10*3/uL (ref 3.4–10.8)

## 2022-03-12 LAB — TOXASSURE SELECT 13 (MW), URINE

## 2022-03-25 ENCOUNTER — Telehealth: Payer: Medicare Other

## 2022-04-04 DIAGNOSIS — B351 Tinea unguium: Secondary | ICD-10-CM | POA: Diagnosis not present

## 2022-04-04 DIAGNOSIS — L84 Corns and callosities: Secondary | ICD-10-CM | POA: Diagnosis not present

## 2022-04-04 DIAGNOSIS — E1142 Type 2 diabetes mellitus with diabetic polyneuropathy: Secondary | ICD-10-CM | POA: Diagnosis not present

## 2022-04-04 DIAGNOSIS — M79676 Pain in unspecified toe(s): Secondary | ICD-10-CM | POA: Diagnosis not present

## 2022-05-08 ENCOUNTER — Telehealth: Payer: Self-pay | Admitting: Nurse Practitioner

## 2022-05-08 NOTE — Telephone Encounter (Signed)
Appt made for MMM at 2:45pm 05/09/22

## 2022-05-09 ENCOUNTER — Encounter: Payer: Self-pay | Admitting: Nurse Practitioner

## 2022-05-09 ENCOUNTER — Ambulatory Visit (INDEPENDENT_AMBULATORY_CARE_PROVIDER_SITE_OTHER): Payer: Medicare Other | Admitting: Nurse Practitioner

## 2022-05-09 VITALS — BP 166/52 | HR 62 | Temp 97.8°F | Resp 20 | Ht 61.0 in | Wt 121.0 lb

## 2022-05-09 DIAGNOSIS — L02611 Cutaneous abscess of right foot: Secondary | ICD-10-CM

## 2022-05-09 DIAGNOSIS — L03031 Cellulitis of right toe: Secondary | ICD-10-CM | POA: Diagnosis not present

## 2022-05-09 MED ORDER — CEPHALEXIN 500 MG PO CAPS: 500.0000 mg | ORAL_CAPSULE | Freq: Three times a day (TID) | ORAL | 0 refills | Status: DC

## 2022-05-09 NOTE — Progress Notes (Signed)
   Subjective:    Patient ID: Kara Dean, female    DOB: 11-02-1933, 88 y.o.   MRN: [id number]   Chief Complaint: edema  HPI  right lower foot swelling with redness at the base of toes. Started 2 days ago.    Review of Systems  Constitutional:  Negative for diaphoresis.  Eyes:  Negative for pain.  Respiratory:  Negative for shortness of breath.   Cardiovascular:  Positive for leg swelling. Negative for chest pain and palpitations.  Gastrointestinal:  Negative for abdominal pain.  Endocrine: Negative for polydipsia.  Skin:  Negative for rash.  Neurological:  Negative for dizziness, weakness and headaches.  Hematological:  Does not bruise/bleed easily.  All other systems reviewed and are negative.      Objective:   Physical Exam Vitals reviewed.  Constitutional:      Appearance: Normal appearance.  Cardiovascular:     Rate and Rhythm: Normal rate and regular rhythm.     Heart sounds: Normal heart sounds.  Pulmonary:     Effort: Pulmonary effort is normal.     Breath sounds: Normal breath sounds.  Musculoskeletal:     Right lower leg: Edema (right foot) present.  Skin:    General: Skin is warm.     Comments: erythema and heat at the base of bil toes  Neurological:     Mental Status: She is alert.    BP (!) 166/52   Pulse 62   Temp 97.8 F (36.6 C) (Temporal)   Resp 20   Ht 5' 1"  (1.549 m)   Wt 121 lb (54.9 kg)   SpO2 98%   BMI 22.86 kg/m         Assessment & Plan:   Kara Dean in today with chief complaint of Right leg and foot swelling (Wants referral to derm for lesion on left leg/)   1. Cellulitis and abscess of toe of right foot Ice Elevate Rto prn - cephALEXin (KEFLEX) 500 MG capsule; Take 1 capsule (500 mg total) by mouth 3 (three) times daily.  Dispense: 30 capsule; Refill: 0    The above assessment and management plan was discussed with the patient. The patient verbalized understanding of and has agreed to the management plan.  Patient is aware to call the clinic if symptoms persist or worsen. Patient is aware when to return to the clinic for a follow-up visit. Patient educated on when it is appropriate to go to the emergency department.   [name], FNP

## 2022-05-09 NOTE — Patient Instructions (Signed)
Cellulitis, Adult  Cellulitis is a skin infection. The infected area is often warm, red, swollen, and sore. It occurs most often in the arms and lower legs. It is very important to get treated for this condition. What are the causes? This condition is caused by bacteria. The bacteria enter through a break in the skin, such as a cut, burn, insect bite, open sore, or crack. What increases the risk? This condition is more likely to occur in people who: Have a weak body defense system (immune system). Have open cuts, burns, bites, or scrapes on the skin. Are older than 86 years of age. Have a blood sugar problem (diabetes). Have a long-lasting (chronic) liver disease (cirrhosis) or kidney disease. Are very overweight (obese). Have a skin problem, such as: Itchy rash (eczema). Slow movement of blood in the veins (venous stasis). Fluid buildup below the skin (edema). Have been treated with high-energy rays (radiation). Use IV drugs. What are the signs or symptoms? Symptoms of this condition include: Skin that is: Red. Streaking. Spotting. Swollen. Sore or painful when you touch it. Warm. A fever. Chills. Blisters. How is this diagnosed? This condition is diagnosed based on: Medical history. Physical exam. Blood tests. Imaging tests. How is this treated? Treatment for this condition may include: Medicines to treat infections or allergies. Home care, such as: Rest. Placing cold or warm cloths (compresses) on the skin. Hospital care, if the condition is very bad. Follow these instructions at home: Medicines Take over-the-counter and prescription medicines only as told by your doctor. If you were prescribed an antibiotic medicine, take it as told by your doctor. Do not stop taking it even if you start to feel better. General instructions  Drink enough fluid to keep your pee (urine) pale yellow. Do not touch or rub the infected area. Raise (elevate) the infected area above  the level of your heart while you are sitting or lying down. Place cold or warm cloths on the area as told by your doctor. Keep all follow-up visits as told by your doctor. This is important. Contact a doctor if: You have a fever. You do not start to get better after 1-2 days of treatment. Your bone or joint under the infected area starts to hurt after the skin has healed. Your infection comes back. This can happen in the same area or another area. You have a swollen bump in the area. You have new symptoms. You feel ill and have muscle aches and pains. Get help right away if: Your symptoms get worse. You feel very sleepy. You throw up (vomit) or have watery poop (diarrhea) for a long time. You see red streaks coming from the area. Your red area gets larger. Your red area turns dark in color. These symptoms may represent a serious problem that is an emergency. Do not wait to see if the symptoms will go away. Get medical help right away. Call your local emergency services (911 in the [place] Do not drive yourself to the hospital. Summary Cellulitis is a skin infection. The area is often warm, red, swollen, and sore. This condition is treated with medicines, rest, and cold and warm cloths. Take all medicines only as told by your doctor. Tell your doctor if symptoms do not start to get better after 1-2 days of treatment. This information is not intended to replace advice given to you by your health care provider. Make sure you discuss any questions you have with your health care provider. Document Revised: 08/02/2021 Document  Reviewed: 08/02/2021 Elsevier Patient Education  [place].

## 2022-05-15 ENCOUNTER — Ambulatory Visit: Payer: Self-pay | Admitting: *Deleted

## 2022-05-15 DIAGNOSIS — I10 Essential (primary) hypertension: Secondary | ICD-10-CM

## 2022-05-15 DIAGNOSIS — E119 Type 2 diabetes mellitus without complications: Secondary | ICD-10-CM

## 2022-05-15 NOTE — Chronic Care Management (AMB) (Signed)
  Chronic Care Management   Note  05/15/2022 Name: Kara Dean MRN: [id number] DOB: October 12, 1933   Patient has not recently engaged with the Chronic Care Management RN Care Manager. Removing RN Care Manager from Care Team and closing [place]. If patient is currently engaged with another CCM team member I will forward this encounter to inform them of my case closure. Patient may be eligible for re-engagement with RN Care Manager in the future if necessary and can discuss this with their PCP.  [name], BSN, RN-BC Embedded Chronic Care Manager [facility] [place] [facility] Direct Dial: [phone]

## 2022-05-15 NOTE — Patient Instructions (Signed)
[place]  I have enjoyed working with you through the Chronic Care Management Program at [facility]. Due to program changes and no recent contact with the [facility], I am removing myself from your care team. If you are currently active with another CCM Team Member, you will remain active with them unless they reach out to you with additional information. If you feel that you need RN Care Management services in the future, please talk with your primary care provider to discuss re-engagement with the RN Care Manager.   Thank you for allowing me to participate in your your healthcare journey.  [name], BSN, RN-BC Embedded Chronic Care Manager [facility] Direct Dial: [phone]

## 2022-06-20 DIAGNOSIS — B351 Tinea unguium: Secondary | ICD-10-CM | POA: Diagnosis not present

## 2022-06-20 DIAGNOSIS — L84 Corns and callosities: Secondary | ICD-10-CM | POA: Diagnosis not present

## 2022-06-20 DIAGNOSIS — M79676 Pain in unspecified toe(s): Secondary | ICD-10-CM | POA: Diagnosis not present

## 2022-06-20 DIAGNOSIS — E1142 Type 2 diabetes mellitus with diabetic polyneuropathy: Secondary | ICD-10-CM | POA: Diagnosis not present

## 2022-07-12 ENCOUNTER — Ambulatory Visit: Payer: Medicare Other | Admitting: Cardiovascular Disease

## 2022-07-24 ENCOUNTER — Encounter: Payer: Self-pay | Admitting: Cardiovascular Disease

## 2022-07-24 ENCOUNTER — Ambulatory Visit: Payer: Medicare Other | Attending: Cardiovascular Disease | Admitting: Cardiovascular Disease

## 2022-07-24 VITALS — BP 140/70 | HR 76 | Ht 60.0 in | Wt 121.2 lb

## 2022-07-24 DIAGNOSIS — I5032 Chronic diastolic (congestive) heart failure: Secondary | ICD-10-CM | POA: Diagnosis not present

## 2022-07-24 DIAGNOSIS — I10 Essential (primary) hypertension: Secondary | ICD-10-CM | POA: Diagnosis not present

## 2022-07-24 DIAGNOSIS — E782 Mixed hyperlipidemia: Secondary | ICD-10-CM | POA: Diagnosis not present

## 2022-07-24 NOTE — Patient Instructions (Signed)
Medication Instructions:  Your physician recommends that you continue on your current medications as directed. Please refer to the Current Medication list given to you today.  *If you need a refill on your cardiac medications before your next appointment, please call your pharmacy*   Lab Work: NONE If you have labs (blood work) drawn today and your tests are completely normal, you will receive your results only by: [place] (if you have MyChart) OR A paper copy in the mail If you have any lab test that is abnormal or we need to change your treatment, we will call you to review the results.   Testing/Procedures: NONE  Follow-Up: At [facility], you and your health needs are our priority.  As part of our continuing mission to provide you with exceptional heart care, we have created designated Provider Care Teams.  These Care Teams include your primary Cardiologist (physician) and Advanced Practice Providers (APPs -  Physician Assistants and Nurse Practitioners) who all work together to provide you with the care you need, when you need it.  Your next appointment:   1 year(s)  The format for your next appointment:   In Person  Provider:   Sherren Mocha, MD       Important Information About Sugar

## 2022-07-24 NOTE — Progress Notes (Signed)
Cardiology Office Note:    Date:  07/24/2022   ID:  Bailei, Buist 06-30-1933, MRN [id number]  PCP:  [name], [place] Prov[facility] Cardiologist:  Sherren Mocha, MD Cardiology APP:  Sh[facility]on [place]     Referring MD: [name], *   Chief Complaint  Patient presents with   Hypertension    History of Present Illness:    Zarriah Starkel Rodick is a 88 y.o. female with a hx of hypertension, remote TIA, heart failure with preserved ejection fraction, and mixed hyperlipidemia, presenting for follow-up evaluation.  The patient is here with her daughter today.  She was last seen in our office in March 2023.  She was hospitalized with chest discomfort in July 2022 and nuclear stress test showed no reversible ischemia.  Her medications were adjusted because of bradycardia her metoprolol was reduced from 100 down to 25 mg daily.  The patient reports that she is doing fairly well.  She does have mild exertional dyspnea but denies orthopnea, PND, or leg swelling.  She occasionally has sharp chest pain when she changes position but otherwise denies symptoms of exertional chest pain or pressure.  Sometimes she does not take her medicines because she feels "woozy."  Past Medical History:  Diagnosis Date   Anxiety    Carotid artery disease ([place])    Carotid US 11/2018:  bilateral ICA 1-39 >> repeat 2 years.    Cataract    Crohn's colitis ([place])    CVA (cerebral infarction)    No deficits   Depression    Diabetes mellitus    type 2   Dyslipidemia    GERD (gastroesophageal reflux disease)    History of TIAs    no deficits   Hyperlipidemia    Hypertension    Hyponatremia    Nodular basal cell carcinoma (BCC) 04/22/2018   left prox, nasal root (tx p bx)   Osteoarthritis    SCC (squamous cell carcinoma) 04/22/2018   left shin,superior-(tx p bx), Left shin,inferior-(tx p bx )    Past Surgical History:  Procedure Laterality Date   ABDOMINAL  HYSTERECTOMY     BACK SURGERY  1994   CATARACT EXTRACTION W/PHACO Left 09/19/2017   Procedure: CATARACT EXTRACTION PHACO AND INTRAOCULAR LENS PLACEMENT ([place]);  Surgeon: Baruch Goldmann, MD;  Location: [facility];  Service: Ophthalmology;  Laterality: Left;  CDE: 8.10   CATARACT EXTRACTION W/PHACO Right 10/10/2017   Procedure: CATARACT EXTRACTION PHACO AND INTRAOCULAR LENS PLACEMENT RIGHT EYE;  Surgeon: Baruch Goldmann, MD;  Location: [facility];  Service: Ophthalmology;  Laterality: Right;  CDE: 9.60   CHOLECYSTECTOMY N/A 05/10/2021   Procedure: LAPAROSCOPIC CHOLECYSTECTOMY;  Surgeon: Clovis Riley, MD;  Location: [place];  Service: General;  Laterality: N/A;   COLON RESECTION  1990's   TUBAL LIGATION     VESICOVAGINAL FISTULA CLOSURE W/ TAH  1984    Current Medications: Current Meds  Medication Sig   amLODipine (NORVASC) 10 MG tablet Take 1 tablet (10 mg total) by mouth daily.   atorvastatin (LIPITOR) 10 MG tablet Take 1 tablet (10 mg total) by mouth daily. (NEEDS TO BE SEEN BEFORE NEXT REFILL)   B Complex Vitamins (B COMPLEX-B12) TABS Take 1 tablet by mouth daily.   cephALEXin (KEFLEX) 500 MG capsule Take 1 capsule (500 mg total) by mouth 3 (three) times daily.   Cholecalciferol (VITAMIN D3 PO) Take 1 capsule by mouth daily.   dipyridamole-aspirin (AGGRENOX) 200-25 MG 12hr capsule Take 1  capsule by mouth daily.   fluticasone (FLONASE) 50 MCG/ACT nasal spray Place 2 sprays into both nostrils daily.   furosemide (LASIX) 20 MG tablet Take 1 tablet (20 mg total) by mouth daily.   glucose blood (ONETOUCH ULTRA) test strip CHECK BLOOD SUGER UP TO 3 TIMES A DAY   LANTUS SOLOSTAR 100 UNIT/ML Solostar Pen Inject 30 Units into the skin daily.   LORazepam (ATIVAN) 0.5 MG tablet Take 1 tablet (0.5 mg total) by mouth 2 (two) times daily.   Magnesium 250 MG TABS Take 250 mg by mouth daily.   metoprolol succinate (TOPROL-XL) 25 MG 24 hr tablet Take 1 tablet (25 mg total) by mouth daily. Take with or immediately  following a meal.   olmesartan (BENICAR) 40 MG tablet Take 1 tablet (40 mg total) by mouth daily.   Omega-3 Fatty Acids (FISH OIL) 1000 MG CAPS Take 1 capsule by mouth daily.   pantoprazole (PROTONIX) 40 MG tablet Take 1 tablet (40 mg total) by mouth daily. (NEEDS TO BE SEEN BEFORE NEXT REFILL)   potassium chloride (KLOR-CON) 10 MEQ tablet Take 1 tablet (10 mEq total) by mouth as directed. Mon, Wed, and Fri only.     Allergies:   Codeine, Latex, Morphine, Niacin, Sulfa antibiotics, and Sulfonamide derivatives   Social History   Socioeconomic History   Marital status: Widowed    Spouse name: Not on file   Number of children: 6   Years of education: 10   Highest education level: 10th grade  Occupational History   Occupation: Retired    Comment: [employer]  Tobacco Use   Smoking status: Never    Passive exposure: Current   Smokeless tobacco: Never  Vaping Use   Vaping Use: Never used  Substance and Sexual Activity   Alcohol use: No   Drug use: No   Sexual activity: Not Currently  Other Topics Concern   Not on file  Social History Narrative   Mrs Bodkins is retired and widowed. She lives at home and her granddaughter lives with her. She lives within walking distance to her 2 daughter's houses. Has an indoor cat. Her sister moved to [place] and she doesn't get to get to visit with her often but does talk with her on the phone. She has 6 children and many grandchildren and great grandchildren.    Social Determinants of Health   Financial Resource Strain: Low Risk  (02/20/2022)   Overall Financial Resource Strain (CARDIA)    Difficulty of Paying Living Expenses: Not hard at all  Food Insecurity: No Food Insecurity (02/20/2022)   Hunger Vital Sign    Worried About Running Out of Food in the Last Year: Never true    Ran Out of Food in the Last Year: Never true  Transportation Needs: No Transportation Needs (02/20/2022)   PRAPARE - Hydrologist (Medical): No     Lack of Transportation (Non-Medical): No  Physical Activity: Insufficiently Active (02/20/2022)   Exercise Vital Sign    Days of Exercise per Week: 7 days    Minutes of Exercise per Session: 10 min  Stress: Stress Concern Present (02/20/2022)   [place]    Feeling of Stress : To some extent  Social Connections: Socially Isolated (02/20/2022)   Social Connection and Isolation Panel [NHANES]    Frequency of Communication with Friends and Family: More than three times a week    Frequency of Social Gatherings with Friends  and Family: Twice a week    Attends Religious Services: Never    Active Member of Clubs or Organizations: No    Attends Archivist Meetings: Never    Marital Status: Widowed     Family History: The patient's family history includes Alcohol abuse in her brother; Coronary artery disease in her brother and sister; Emphysema in her father; Heart attack in her daughter; Hypertension in her mother; Lung disease in her brother; Seizures in her sister; Stroke in her mother.  ROS:   Please see the history of present illness.    All other systems reviewed and are negative.  EKGs/Labs/Other Studies Reviewed:    The following studies were reviewed today: Echo 12/19/2021: 1. Left ventricular ejection fraction, by estimation, is 60 to 65%. The  left ventricle has normal function. The left ventricle has no regional  wall motion abnormalities. Left ventricular diastolic parameters are  indeterminate. The average left  ventricular global longitudinal strain is -14.1 %. The global longitudinal  strain is abnormal.   2. Right ventricular systolic function is normal. The right ventricular  size is normal.   3. Left atrial size was mildly dilated.   4. The mitral valve is abnormal. Mild mitral valve regurgitation. No  evidence of mitral stenosis.   5. Tricuspid valve regurgitation is moderate.   6. The aortic  valve is tricuspid. There is mild calcification of the  aortic valve. There is moderate thickening of the aortic valve. Aortic  valve regurgitation is moderate. Aortic valve sclerosis is present, with  no evidence of aortic valve stenosis.   7. The inferior vena cava is normal in size with greater than 50%  respiratory variability, suggesting right atrial pressure of 3 mmHg.   Comparison(s): EF 60%, mild LVH, mild-mod AI 681mec, RVSP 69.520mg.   Myocardial Perfusion Study 05/09/2021: IMPRESSION: 1. No reversible ischemia or infarction.   2. Normal left ventricular wall motion.   3. Left ventricular ejection fraction 63%   4. Non invasive risk stratification*: Low  EKG:  EKG is ordered today.  The ekg ordered today demonstrates normal sinus rhythm 76 bpm, leftward axis, nonspecific ST abnormality, pulmonary disease pattern  Recent Labs: 03/05/2022: ALT 10; BUN 17; Creatinine, Ser 1.18; Hemoglobin 12.8; Platelets 206; Potassium 4.1; Sodium 141  Recent Lipid Panel    Component Value Date/Time   CHOL 124 03/05/2022 1532   CHOL 124 02/26/2013 1043   TRIG 123 03/05/2022 1532   TRIG 132 03/09/2015 1036   TRIG 182 (H) 02/26/2013 1043   HDL 48 03/05/2022 1532   HDL 39 (L) 03/09/2015 1036   HDL 40 02/26/2013 1043   CHOLHDL 2.6 03/05/2022 1532   CHOLHDL 3.0 11/10/2010 0327   VLDL 25 11/10/2010 0327   LDLCALC 54 03/05/2022 1532   LDLCALC 42 04/27/2014 1455   LDLCALC 48 02/26/2013 1043     Risk Assessment/Calculations:          Physical Exam:    VS:  BP (!) 140/70   Pulse 76   Ht 5' (1.524 m)   Wt 121 lb 3.2 oz (55 kg)   SpO2 94%   BMI 23.67 kg/m     Wt Readings from Last 3 Encounters:  07/24/22 121 lb 3.2 oz (55 kg)  05/09/22 121 lb (54.9 kg)  03/05/22 118 lb (53.5 kg)     GEN:  Well nourished, well developed pleasant elderly woman in no acute distress HEENT: Normal NECK: No JVD; No carotid bruits LYMPHATICS: No lymphadenopathy CARDIAC: RRR,  no murmurs, rubs,  gallops RESPIRATORY:  Clear to auscultation without rales, wheezing or rhonchi  ABDOMEN: Soft, non-tender, non-distended MUSCULOSKELETAL:  No edema; No deformity  SKIN: Warm and dry NEUROLOGIC:  Alert and oriented x 3 PSYCHIATRIC:  Normal affect   ASSESSMENT:    1. Essential hypertension   2. Mixed hyperlipidemia   3. Chronic heart failure with preserved ejection fraction (HCC)    PLAN:    In order of problems listed above:  Blood pressure is controlled on amlodipine, metoprolol succinate, and telmisartan.  Metoprolol succinate was reduced from 100 mg daily to 25 mg daily because of bradycardia.  Seems to be tolerating this well with no dizziness or other complaints at present. Treated with atorvastatin 10 mg.  LDL cholesterol is 54.  Continue current therapy. Clinically stable with NYHA functional class II symptoms and no evidence of volume excess on her exam.  Continue furosemide 20 mg daily.  Continue to follow a low-sodium diet.           Medication Adjustments/Labs and Tests Ordered: Current medicines are reviewed at length with the patient today.  Concerns regarding medicines are outlined above.  No orders of the defined types were placed in this encounter.  No orders of the defined types were placed in this encounter.   Patient Instructions  Medication Instructions:  Your physician recommends that you continue on your current medications as directed. Please refer to the Current Medication list given to you today.  *If you need a refill on your cardiac medications before your next appointment, please call your pharmacy*   Lab Work: NONE If you have labs (blood work) drawn today and your tests are completely normal, you will receive your results only by: Early (if you have MyChart) OR A paper copy in the mail If you have any lab test that is abnormal or we need to change your treatment, we will call you to review the  results.   Testing/Procedures: NONE  Follow-Up: At [facility], you and your health needs are our priority.  As part of our continuing mission to provide you with exceptional heart care, we have created designated Provider Care Teams.  These Care Teams include your primary Cardiologist (physician) and Advanced Practice Providers (APPs -  Physician Assistants and Nurse Practitioners) who all work together to provide you with the care you need, when you need it.  Your next appointment:   1 year(s)  The format for your next appointment:   In Person  Provider:   Sherren Mocha, MD       Important Information About Sugar         Signed, Sherren Mocha, MD  07/24/2022 3:29 PM    [place]

## 2022-07-29 ENCOUNTER — Ambulatory Visit (INDEPENDENT_AMBULATORY_CARE_PROVIDER_SITE_OTHER): Payer: Medicare Other | Admitting: Nurse Practitioner

## 2022-07-29 ENCOUNTER — Encounter: Payer: Self-pay | Admitting: Nurse Practitioner

## 2022-07-29 ENCOUNTER — Other Ambulatory Visit (INDEPENDENT_AMBULATORY_CARE_PROVIDER_SITE_OTHER): Payer: Medicare Other

## 2022-07-29 VITALS — BP 140/82 | HR 40 | Temp 97.8°F | Ht 61.0 in | Wt 121.6 lb

## 2022-07-29 DIAGNOSIS — I498 Other specified cardiac arrhythmias: Secondary | ICD-10-CM

## 2022-07-29 DIAGNOSIS — R001 Bradycardia, unspecified: Secondary | ICD-10-CM

## 2022-07-29 DIAGNOSIS — R051 Acute cough: Secondary | ICD-10-CM | POA: Diagnosis not present

## 2022-07-29 MED ORDER — BENZONATATE 100 MG PO CAPS: 100.0000 mg | ORAL_CAPSULE | Freq: Three times a day (TID) | ORAL | 0 refills | Status: DC | PRN

## 2022-07-29 NOTE — Patient Instructions (Signed)

## 2022-07-29 NOTE — Addendum Note (Signed)
Addended by: [name] on: 07/29/2022 04:35 PM   Modules accepted: Orders

## 2022-07-29 NOTE — Progress Notes (Addendum)
   Subjective:    Patient ID: Kara Dean, female    DOB: 28-Nov-1932, 88 y.o.   MRN: [id number]   Chief Complaint: Nasal Congestion and Cough (X 1 week )   Cough This is a new problem. The current episode started in the past 7 days. The problem has been gradually worsening. The problem occurs every few hours. The cough is Productive of sputum. Associated symptoms include nasal congestion and rhinorrhea. Pertinent negatives include no ear congestion or ear pain. Nothing aggravates the symptoms. She has tried OTC cough suppressant for the symptoms. The treatment provided mild relief. Her past medical history is significant for COPD.       Review of Systems  HENT:  Positive for rhinorrhea. Negative for ear pain.   Respiratory:  Positive for cough.   All other systems reviewed and are negative.      Objective:   Physical Exam Vitals and nursing note reviewed.  Constitutional:      Appearance: Normal appearance.  Cardiovascular:     Rate and Rhythm: Normal rate.     Heart sounds:     Gallop present.     Comments: Regular rhythym Pulmonary:     Effort: Pulmonary effort is normal.     Breath sounds: Normal breath sounds.  Skin:    General: Skin is warm.  Neurological:     General: No focal deficit present.     Mental Status: She is alert and oriented to person, place, and time.  Psychiatric:        Mood and Affect: Mood normal.        Behavior: Behavior normal.    BP (!) 140/82   Pulse (!) 40   Temp 97.8 F (36.6 C) (Temporal)   Ht 5' 1"  (1.549 m)   Wt 121 lb 9.6 oz (55.2 kg)   SpO2 95%   BMI 22.98 kg/m        Assessment & Plan:   Kara Dean in today with chief complaint of Nasal Congestion and Cough (X 1 week )   1. Bradycardia - EKG 12-Lead  2. Ventricular bigeminy Spoke with cardiology and they are going to contact her tomrrow about being put on monitor. - BMP8+EGFR  3. Acute cough Drink a little more liquids Labs pending Tessalon perles as  ordered. - benzonatate (TESSALON PERLES) 100 MG capsule; Take 1 capsule (100 mg total) by mouth 3 (three) times daily as needed for cough.  Dispense: 20 capsule; Refill: 0    The above assessment and management plan was discussed with the patient. The patient verbalized understanding of and has agreed to the management plan. Patient is aware to call the clinic if symptoms persist or worsen. Patient is aware when to return to the clinic for a follow-up visit. Patient educated on when it is appropriate to go to the emergency department.   [name], FNP    [place]- patient told to return to office to have ZIo applied. [name], FNP

## 2022-07-30 ENCOUNTER — Telehealth: Payer: Self-pay

## 2022-07-30 ENCOUNTER — Ambulatory Visit: Payer: Medicare Other

## 2022-07-30 DIAGNOSIS — I498 Other specified cardiac arrhythmias: Secondary | ICD-10-CM

## 2022-07-30 LAB — BMP8+EGFR
BUN/Creatinine Ratio: 15 (ref 12–28)
BUN: 18 mg/dL (ref 8–27)
CO2: 23 mmol/L (ref 20–29)
Calcium: 10.1 mg/dL (ref 8.7–10.3)
Chloride: 100 mmol/L (ref 96–106)
Creatinine, Ser: 1.19 mg/dL — ABNORMAL HIGH (ref 0.57–1.00)
Glucose: 279 mg/dL — ABNORMAL HIGH (ref 70–99)
Potassium: 4.5 mmol/L (ref 3.5–5.2)
Sodium: 143 mmol/L (ref 134–144)
eGFR: 45 mL/min/{1.73_m2} — ABNORMAL LOW (ref >59)

## 2022-07-30 NOTE — Telephone Encounter (Signed)
-----   Message from Sherren Mocha, MD sent at 07/29/2022  3:16 PM EDT ----- Judson Roch - can you set her up with a 7 day ZIO monitor? She is in ventricular bigeminy today at her PCP's office. Thank you

## 2022-07-30 NOTE — Telephone Encounter (Signed)
Order placed for zio monitor at this time and routed to Markus Daft for follow-up.

## 2022-07-30 NOTE — Telephone Encounter (Signed)
Monitor applied 07/29/22 at [facility].

## 2022-08-05 ENCOUNTER — Ambulatory Visit (INDEPENDENT_AMBULATORY_CARE_PROVIDER_SITE_OTHER): Payer: Medicare Other | Admitting: Family Medicine

## 2022-08-05 ENCOUNTER — Encounter: Payer: Self-pay | Admitting: Family Medicine

## 2022-08-05 ENCOUNTER — Ambulatory Visit (INDEPENDENT_AMBULATORY_CARE_PROVIDER_SITE_OTHER): Payer: Medicare Other

## 2022-08-05 VITALS — Temp 101.4°F | Ht 61.0 in | Wt 120.0 lb

## 2022-08-05 DIAGNOSIS — R509 Fever, unspecified: Secondary | ICD-10-CM

## 2022-08-05 DIAGNOSIS — R0602 Shortness of breath: Secondary | ICD-10-CM

## 2022-08-05 DIAGNOSIS — J189 Pneumonia, unspecified organism: Secondary | ICD-10-CM

## 2022-08-05 DIAGNOSIS — J939 Pneumothorax, unspecified: Secondary | ICD-10-CM | POA: Diagnosis not present

## 2022-08-05 MED ORDER — ALBUTEROL SULFATE HFA 108 (90 BASE) MCG/ACT IN AERS: 2.0000 | INHALATION_SPRAY | Freq: Four times a day (QID) | RESPIRATORY_TRACT | 0 refills | Status: DC | PRN

## 2022-08-05 MED ORDER — DOXYCYCLINE HYCLATE 100 MG PO TABS: 100.0000 mg | ORAL_TABLET | Freq: Two times a day (BID) | ORAL | 0 refills | Status: AC

## 2022-08-05 MED ORDER — PREDNISONE 20 MG PO TABS: 20.0000 mg | ORAL_TABLET | Freq: Every day | ORAL | 0 refills | Status: AC

## 2022-08-05 NOTE — Patient Instructions (Signed)
Community-Acquired Pneumonia, Adult Pneumonia is a lung infection that causes inflammation and the buildup of mucus and fluids in the lungs. This may cause coughing and difficulty breathing. Community-acquired pneumonia is pneumonia that develops in people who are not, and have not recently been, in a hospital or other health care facility. Usually, pneumonia develops as a result of an illness that is caused by a virus, such as the common cold and the flu (influenza). It can also be caused by bacteria or fungi. While the common cold and influenza can pass from person to person (are contagious), pneumonia itself is not considered contagious. What are the causes? This condition may be caused by: Viruses. Bacteria. Fungi. What increases the risk? The following factors may make you more likely to develop this condition: Being over age 92 or having certain medical conditions, such as: A long-term (chronic) disease, such as: chronic obstructive pulmonary disease (COPD), asthma, heart failure, diabetes, or kidney disease. A condition that increases the risk of breathing in (aspirating) mucus and other fluids from your mouth and nose. A weakened body defense system (immune system). Having had your spleen removed (splenectomy). The spleen is the organ that helps fight germs and infections. Not cleaning your teeth and gums well (poor dental hygiene). Using tobacco products. Traveling to places where germs that cause pneumonia are present or being near certain animals or animal habitats that could have germs that cause pneumonia. What are the signs or symptoms? Symptoms of this condition include: A dry cough or a wet (productive) cough. A fever, sweating, or chills. Chest pain, especially when breathing deeply or coughing. Fast breathing, difficulty breathing, or shortness of breath. Tiredness (fatigue) and muscle aches. How is this diagnosed? This condition may be diagnosed based on your medical  history or a physical exam. You may also have tests, including: Imaging, such as a chest X-ray or lung ultrasound. Tests of: The level of oxygen and other gases in your blood. Mucus from your lungs (sputum). Fluid around your lungs (pleural fluid). Your urine. How is this treated? Treatment for this condition depends on many factors, such as the cause of your pneumonia, your medicines, and other medical conditions that you have. For most adults, pneumonia may be treated at home. In some cases, treatment must happen in a hospital and may include: Medicines that are given by mouth (orally) or through an IV, including: Antibiotic medicines, if bacteria caused the pneumonia. Medicines that kill viruses (antiviral medicines), if a virus caused the pneumonia. Oxygen therapy. Severe pneumonia, although rare, may require the following treatments: Mechanical ventilation.This procedure uses a machine to help you breathe if you cannot breathe well on your own or maintain a safe level of blood oxygen. Thoracentesis. This procedure removes any buildup of pleural fluid to help with breathing. Follow these instructions at home:  Medicines Take over-the-counter and prescription medicines only as told by your health care provider. Take cough medicine only if you have trouble sleeping. Cough medicine can prevent your body from removing mucus from your lungs. If you were prescribed antibiotics, take them as told by your health care provider. Do not stop taking the antibiotic even if you start to feel better. Lifestyle     Do not drink alcohol. Do not use any products that contain nicotine or tobacco. These products include cigarettes, chewing tobacco, and vaping devices, such as e-cigarettes. If you need help quitting, ask your health care provider. Eat a healthy diet. This includes plenty of vegetables, fruits, whole grains, low-fat  dairy products, and lean protein. General instructions Rest a lot and  get at least 8 hours of sleep each night. Sleep in a partly upright position at night. Place a few pillows under your head or sleep in a reclining chair. Return to your normal activities as told by your health care provider. Ask your health care provider what activities are safe for you. Drink enough fluid to keep your urine pale yellow. This helps to thin the mucus in your lungs. If your throat is sore, gargle with a mixture of salt and water 3-4 times a day or as needed. To make salt water, completely dissolve -1 tsp (3-6 g) of salt in 1 cup (237 mL) of warm water. Keep all follow-up visits. How is this prevented? You can lower your risk of developing community-acquired pneumonia by: Getting the pneumonia vaccine. There are different types and schedules of pneumonia vaccines. Ask your health care provider which option is best for you. Consider getting the pneumonia vaccine if: You are older than 86 years of age. You are 8-83 years of age and are receiving cancer treatment, have chronic lung disease, or have other medical conditions that affect your immune system. Ask your health care provider if this applies to you. Getting your influenza vaccine every year. Ask your health care provider which type of vaccine is best for you. Getting regular dental checkups. Washing your hands often with soap and water for at least 20 seconds. If soap and water are not available, use hand sanitizer. Contact a health care provider if: You have a fever. You have trouble sleeping because you cannot control your cough with cough medicine. Get help right away if: Your shortness of breath becomes worse. Your chest pain increases. Your sickness becomes worse, especially if you are an older adult or have a weak immune system. You cough up blood. These symptoms may be an emergency. Get help right away. Call 911. Do not wait to see if the symptoms will go away. Do not drive yourself to the  hospital. Summary Pneumonia is an infection of the lungs. Community-acquired pneumonia develops in people who have not been in the hospital. It can be caused by bacteria, viruses, or fungi. This condition may be treated with antibiotics or antiviral medicines. Severe pneumonia may require a hospital stay and treatment to help with breathing. This information is not intended to replace advice given to you by your health care provider. Make sure you discuss any questions you have with your health care provider. Document Revised: 12/19/2021 Document Reviewed: 12/19/2021 Elsevier Patient Education  [place].

## 2022-08-05 NOTE — Progress Notes (Signed)
Acute Office Visit  Subjective:     Patient ID: Kara Dean, female    DOB: 07/17/1933, 88 y.o.   MRN: [id number]  Chief Complaint  Patient presents with   Shortness of Breath   Nasal Congestion   Cough   Fever    HPI Patient is in today for cough x 3 weeks. She reports that cough is productive of dark yellow/green sputum for the last few days. She also reports head congestion for the last 3 weeks that has worsened. She started running a fever for the last 4 days. With max temp of 103. Reports shortness of breath with activity and coughing for the last 4 days as well along with some general chest discomfort that worsens with breath breathing and coughing. Reports decreased appetite, fatigue, and dizziness for the last 2 days. Dizziness occurs with movement, like the room is spinning. She has been taking flonase, mucinex, and tessalon perles without improvement. She has been staying well hydrated. She has a history of environmental allergies and pneumonia. Hx of chronic interstitial fibrosis on bilateral lungs per pervious CXR reports. Denies sore throat, ear pain, HA, nausea, vomiting, diarrhea, or edema.   ROS As per HPI.      Objective:    Temp (!) 101.4 F (38.6 C) (Temporal)   Ht 5' 1"  (1.549 m)   Wt 120 lb (54.4 kg)   SpO2 96%   BMI 22.67 kg/m    Physical Exam Vitals and nursing note reviewed.  Constitutional:      General: She is not in acute distress.    Appearance: She is ill-appearing. She is not toxic-appearing or diaphoretic.  HENT:     Head: Normocephalic and atraumatic.     Mouth/Throat:     Mouth: Mucous membranes are moist.     Pharynx: Oropharynx is clear. No pharyngeal swelling or oropharyngeal exudate.  Eyes:     Pupils: Pupils are equal, round, and reactive to light.  Cardiovascular:     Rate and Rhythm: Normal rate and regular rhythm.  Pulmonary:     Effort: Pulmonary effort is normal. No tachypnea or respiratory distress.     Breath sounds:  Examination of the right-upper field reveals decreased breath sounds. Examination of the left-upper field reveals decreased breath sounds. Examination of the right-middle field reveals decreased breath sounds. Examination of the left-middle field reveals decreased breath sounds and rhonchi. Examination of the right-lower field reveals decreased breath sounds. Examination of the left-lower field reveals decreased breath sounds and rhonchi. Decreased breath sounds and rhonchi present. No wheezing or rales.  Abdominal:     Palpations: Abdomen is soft.  Musculoskeletal:     Cervical back: Neck supple.  Skin:    General: Skin is warm and dry.  Neurological:     General: No focal deficit present.     Mental Status: She is alert and oriented to person, place, and time.  Psychiatric:        Mood and Affect: Mood normal.        Behavior: Behavior normal.     No results found for any visits on 08/05/22.      Assessment & Plan:   Kara Dean was seen today for shortness of breath, nasal congestion, cough and fever.  Diagnoses and all orders for this visit:  Lingular pneumonia Pneumonia on CXR today. Doxycyline ordered with prednisone burst. Albuterol prn if needed. Discussed symptomatic care and strict return precautions. She will return to repeat CXR per radiology recommendation in  3-4 weeks.  -     DG Chest 2 View; Future -     COVID-19, Flu A+B and RSV -     doxycycline (VIBRA-TABS) 100 MG tablet; Take 1 tablet (100 mg total) by mouth 2 (two) times daily for 7 days. -     predniSONE (DELTASONE) 20 MG tablet; Take 1 tablet (20 mg total) by mouth daily with breakfast for 5 days. -     albuterol (VENTOLIN HFA) 108 (90 Base) MCG/ACT inhaler; Inhale 2 puffs into the lungs every 6 (six) hours as needed for wheezing or shortness of breath.  Fever, unspecified fever cause Covid, flu, RSV pending for possible viral etiology. Will notify patient of results when available.  -     DG Chest 2 View; Future -      COVID-19, Flu A+B and RSV  Return in about 4 weeks (around 09/02/2022) for repeat CXR.  The patient indicates understanding of these issues and agrees with the plan.  Gwenlyn Perking, FNP

## 2022-08-06 LAB — COVID-19, FLU A+B AND RSV
Influenza A, NAA: NOT DETECTED
Influenza B, NAA: NOT DETECTED
RSV, NAA: NOT DETECTED
SARS-CoV-2, NAA: NOT DETECTED

## 2022-08-21 DIAGNOSIS — I498 Other specified cardiac arrhythmias: Secondary | ICD-10-CM | POA: Diagnosis not present

## 2022-08-22 ENCOUNTER — Telehealth: Payer: Self-pay | Admitting: Nurse Practitioner

## 2022-08-22 NOTE — Telephone Encounter (Signed)
Attempted to contact patients daughter who is contact in the chart. NA and VM is full

## 2022-08-22 NOTE — Telephone Encounter (Signed)
Please let patient know that heart monitor showed heart rate in 30's a couple of times as well as a few rune os SVT. Has she seen crdiology. Needs to see ASAP if has not.  Patch Wear Time:  13 days and 22 hours (2023-09-25T16:44:27-0400 to 2023-10-09T15:32:00-0400)   Patient had a min HR of 39 bpm, max HR of 174 bpm, and avg HR of 65 bpm. Predominant underlying rhythm was Sinus Rhythm. Bundle Branch Block/IVCD was present. 1 Ventricular Tachycardia runs occurred, the run with the fastest interval lasting 4 beats with  a max rate of 160 bpm, the longest lasting 4 beats with an avg rate of 142 bpm. 14 Supraventricular Tachycardia runs occurred, the run with the fastest interval lasting 5 beats with a max rate of 174 bpm, the longest lasting 15 beats with an avg rate of  117 bpm. Isolated SVEs were occasional (1.9%, 24483), SVE Couplets were rare (<1.0%, 389), and SVE Triplets were rare (<1.0%, 32). Isolated VEs were frequent (6.2%, 81260), VE Couplets were occasional (1.1%, 7472), and no VE Triplets were present.  Ventricular Bigeminy and Trigeminy were present. This result has not been signed. Information might be incomplete.   Kara Dean Done, FNP

## 2022-08-28 NOTE — Telephone Encounter (Signed)
Left vm for cb

## 2022-08-28 NOTE — Telephone Encounter (Signed)
Spoke with daughter who is patients caregiver and notified her of results and recommendations. She states that 1 week before she started wearing this monitor that she saw her cardiologist and checked out fine. She has been monitoring her heart rate daily and it has been in the 50s, 60s, and 70s. Do you still want her to see cardiology? She would rather monitor at home

## 2022-08-28 NOTE — Telephone Encounter (Signed)
Just continue to monitor heart rate- will need to see cardiologist soon though

## 2022-08-29 DIAGNOSIS — E1142 Type 2 diabetes mellitus with diabetic polyneuropathy: Secondary | ICD-10-CM | POA: Diagnosis not present

## 2022-08-29 DIAGNOSIS — B351 Tinea unguium: Secondary | ICD-10-CM | POA: Diagnosis not present

## 2022-08-29 DIAGNOSIS — L84 Corns and callosities: Secondary | ICD-10-CM | POA: Diagnosis not present

## 2022-08-29 DIAGNOSIS — M79676 Pain in unspecified toe(s): Secondary | ICD-10-CM | POA: Diagnosis not present

## 2022-09-05 ENCOUNTER — Ambulatory Visit (INDEPENDENT_AMBULATORY_CARE_PROVIDER_SITE_OTHER): Payer: Medicare Other | Admitting: Nurse Practitioner

## 2022-09-05 ENCOUNTER — Encounter: Payer: Self-pay | Admitting: Nurse Practitioner

## 2022-09-05 VITALS — BP 151/57 | HR 63 | Temp 98.2°F | Resp 20 | Ht 61.0 in | Wt 118.0 lb

## 2022-09-05 DIAGNOSIS — I6930 Unspecified sequelae of cerebral infarction: Secondary | ICD-10-CM | POA: Diagnosis not present

## 2022-09-05 DIAGNOSIS — E785 Hyperlipidemia, unspecified: Secondary | ICD-10-CM | POA: Diagnosis not present

## 2022-09-05 DIAGNOSIS — K219 Gastro-esophageal reflux disease without esophagitis: Secondary | ICD-10-CM | POA: Diagnosis not present

## 2022-09-05 DIAGNOSIS — R609 Edema, unspecified: Secondary | ICD-10-CM | POA: Diagnosis not present

## 2022-09-05 DIAGNOSIS — I10 Essential (primary) hypertension: Secondary | ICD-10-CM | POA: Diagnosis not present

## 2022-09-05 DIAGNOSIS — R600 Localized edema: Secondary | ICD-10-CM

## 2022-09-05 DIAGNOSIS — L989 Disorder of the skin and subcutaneous tissue, unspecified: Secondary | ICD-10-CM

## 2022-09-05 DIAGNOSIS — F3342 Major depressive disorder, recurrent, in full remission: Secondary | ICD-10-CM | POA: Diagnosis not present

## 2022-09-05 DIAGNOSIS — E119 Type 2 diabetes mellitus without complications: Secondary | ICD-10-CM

## 2022-09-05 DIAGNOSIS — N1831 Chronic kidney disease, stage 3a: Secondary | ICD-10-CM | POA: Diagnosis not present

## 2022-09-05 DIAGNOSIS — F411 Generalized anxiety disorder: Secondary | ICD-10-CM

## 2022-09-05 DIAGNOSIS — R7989 Other specified abnormal findings of blood chemistry: Secondary | ICD-10-CM | POA: Diagnosis not present

## 2022-09-05 DIAGNOSIS — Z794 Long term (current) use of insulin: Secondary | ICD-10-CM | POA: Diagnosis not present

## 2022-09-05 LAB — BAYER DCA HB A1C WAIVED: HB A1C (BAYER DCA - WAIVED): 7.8 % — ABNORMAL HIGH (ref 4.8–5.6)

## 2022-09-05 MED ORDER — PANTOPRAZOLE SODIUM 40 MG PO TBEC: 40.0000 mg | DELAYED_RELEASE_TABLET | Freq: Every day | ORAL | 1 refills | Status: DC

## 2022-09-05 MED ORDER — FUROSEMIDE 20 MG PO TABS: 20.0000 mg | ORAL_TABLET | Freq: Every day | ORAL | 1 refills | Status: DC

## 2022-09-05 MED ORDER — AMLODIPINE BESYLATE 10 MG PO TABS: 10.0000 mg | ORAL_TABLET | Freq: Every day | ORAL | 1 refills | Status: DC

## 2022-09-05 MED ORDER — LORAZEPAM 0.5 MG PO TABS: 0.5000 mg | ORAL_TABLET | Freq: Two times a day (BID) | ORAL | 5 refills | Status: DC

## 2022-09-05 MED ORDER — ATORVASTATIN CALCIUM 10 MG PO TABS: 10.0000 mg | ORAL_TABLET | Freq: Every day | ORAL | 1 refills | Status: DC

## 2022-09-05 MED ORDER — METOPROLOL SUCCINATE ER 25 MG PO TB24: 25.0000 mg | ORAL_TABLET | Freq: Every day | ORAL | 1 refills | Status: DC

## 2022-09-05 MED ORDER — POTASSIUM CHLORIDE ER 10 MEQ PO TBCR: 10.0000 meq | EXTENDED_RELEASE_TABLET | ORAL | 1 refills | Status: DC

## 2022-09-05 MED ORDER — OLMESARTAN MEDOXOMIL 40 MG PO TABS: 40.0000 mg | ORAL_TABLET | Freq: Every day | ORAL | 1 refills | Status: DC

## 2022-09-05 NOTE — Patient Instructions (Signed)

## 2022-09-05 NOTE — Progress Notes (Signed)
Subjective:    Patient ID: Kara Dean, female    DOB: October 12, 1933, 88 y.o.   MRN: [id number]   Chief Complaint: medical management of chronic issues   HPI:  Kara Dean is a 88 y.o. who identifies as a female who was assigned female at birth.   Social history: Lives with: youngest daughter Work history: retired   Scientist, forensic in today for follow up of the following chronic medical issues:  1. Essential hypertension No c/o chest pain, sob or headache. Does check BP at home, SBP usually runs  between 120-140. BP Readings from Last 3 Encounters:  09/05/22 (!) 151/57  07/29/22 (!) 140/82  07/24/22 (!) 140/70     2. Type 2 diabetes mellitus treated with insulin (HCC) Tries to watch carb intake. FBS have been anywhere from 110s to 200s. No low blood sugars. Lab Results  Component Value Date   HGBA1C 6.5 (H) 03/05/2022     3. Hyperlipidemia, unspecified hyperlipidemia type Does not really watch diet and does little exercise. Lab Results  Component Value Date   CHOL 124 03/05/2022   HDL 48 03/05/2022   LDLCALC 54 03/05/2022   TRIG 123 03/05/2022   CHOLHDL 2.6 03/05/2022   The ASCVD Risk score (Arnett DK, et al., 2019) failed to calculate for the following reasons:   The 2019 ASCVD risk score is only valid for ages 5 to 57   5. GAD (generalized anxiety disorder) Doing well on ativan BID.    09/05/2022    2:29 PM 07/29/2022    3:05 PM 03/05/2022    3:39 PM 12/03/2021    2:17 PM  GAD 7 : Generalized Anxiety Score  Nervous, Anxious, on Edge _0 Control/stop worrying _1 Worry too much - different things _2 Trouble relaxing 0 _3 Restless 0 0 0 0  Easily annoyed or irritable 1 2 0 1  Afraid - awful might happen 0 2 2 0  Total GAD 7 Score _4 Anxiety Difficulty Not difficult at all Not difficult at all Somewhat difficult Not difficult at all       09/05/2022    2:28 PM 07/29/2022    3:04 PM 03/05/2022    3:38 PM 02/20/2022   11:38 AM  01/31/2022   11:15 AM  Depression screen PHQ 2/9  Decreased Interest 1 1 0 1 1  Down, Depressed, Hopeless _5 PHQ - 2 Score _6 Altered sleeping 0 _7 Tired, decreased energy _8 Change in appetite 1 0 0 1 1  Feeling bad or failure about yourself  0 0 _9 Trouble concentrating 0 1 0 1 1  Moving slowly or fidgety/restless 0 0 0 1 1  Suicidal thoughts 0 0 0 0 0  PHQ-9 Score _10 Difficult doing work/chores Not difficult at all Not difficult at all Not difficult at all Somewhat difficult Somewhat difficult    6. Gastroesophageal reflux disease without esophagitis Doing well on daily protonix.  7. Late effect of cerebrovascular accident (CVA) No permanent effects.  8. Peripheral edema No issues recently.  9. Chronic kidney disease (CKD) stage G3a/A3, moderately decreased glomerular filtration rate (GFR) between 45-59 mL/min/1.73 square meter and albuminuria creatinine ratio greater than 300 mg/g (HCC) Has to get up about 3 times  a night to void; having  increased incontinence.  Lab Results  Component Value Date   CREATININE 1.19 (H) 07/29/2022     10. Elevated liver function tests Lab Results  Component Value Date   ALT 10 03/05/2022   AST 17 03/05/2022   ALKPHOS 72 03/05/2022   BILITOT 0.6 03/05/2022      New complaints: none  Allergies  Allergen Reactions   Codeine Other (See Comments)    unknown   Latex Rash   Morphine Other (See Comments)    unknown   Niacin Other (See Comments)    unknown   Sulfa Antibiotics Rash   Sulfonamide Derivatives Rash   Outpatient Encounter Medications as of 09/05/2022  Medication Sig   albuterol (VENTOLIN HFA) 108 (90 Base) MCG/ACT inhaler Inhale 2 puffs into the lungs every 6 (six) hours as needed for wheezing or shortness of breath.   amLODipine (NORVASC) 10 MG tablet Take 1 tablet (10 mg total) by mouth daily.   atorvastatin (LIPITOR) 10 MG tablet Take 1 tablet (10 mg total) by mouth  daily. (NEEDS TO BE SEEN BEFORE NEXT REFILL)   B Complex Vitamins (B COMPLEX-B12) TABS Take 1 tablet by mouth daily.   benzonatate (TESSALON PERLES) 100 MG capsule Take 1 capsule (100 mg total) by mouth 3 (three) times daily as needed for cough.   Cholecalciferol (VITAMIN D3 PO) Take 1 capsule by mouth daily.   dipyridamole-aspirin (AGGRENOX) 200-25 MG 12hr capsule Take 1 capsule by mouth daily.   fluticasone (FLONASE) 50 MCG/ACT nasal spray Place 2 sprays into both nostrils daily.   furosemide (LASIX) 20 MG tablet Take 1 tablet (20 mg total) by mouth daily. (Patient not taking: Reported on 08/05/2022)   glucose blood (ONETOUCH ULTRA) test strip CHECK BLOOD SUGER UP TO 3 TIMES A DAY   LANTUS SOLOSTAR 100 UNIT/ML Solostar Pen Inject 30 Units into the skin daily.   LORazepam (ATIVAN) 0.5 MG tablet Take 1 tablet (0.5 mg total) by mouth 2 (two) times daily.   Magnesium 250 MG TABS Take 250 mg by mouth daily.   metoprolol succinate (TOPROL-XL) 25 MG 24 hr tablet Take 1 tablet (25 mg total) by mouth daily. Take with or immediately following a meal.   olmesartan (BENICAR) 40 MG tablet Take 1 tablet (40 mg total) by mouth daily.   Omega-3 Fatty Acids (FISH OIL) 1000 MG CAPS Take 1 capsule by mouth daily.   pantoprazole (PROTONIX) 40 MG tablet Take 1 tablet (40 mg total) by mouth daily. (NEEDS TO BE SEEN BEFORE NEXT REFILL)   potassium chloride (KLOR-CON) 10 MEQ tablet Take 1 tablet (10 mEq total) by mouth as directed. Mon, Wed, and Fri only.   No facility-administered encounter medications on file as of 09/05/2022.    Past Surgical History:  Procedure Laterality Date   ABDOMINAL HYSTERECTOMY     BACK SURGERY  1994   CATARACT EXTRACTION W/PHACO Left 09/19/2017   Procedure: CATARACT EXTRACTION PHACO AND INTRAOCULAR LENS PLACEMENT ([place]);  Surgeon: Baruch Goldmann, MD;  Location: [facility];  Service: Ophthalmology;  Laterality: Left;  CDE: 8.10   CATARACT EXTRACTION W/PHACO Right 10/10/2017   Procedure:  CATARACT EXTRACTION PHACO AND INTRAOCULAR LENS PLACEMENT RIGHT EYE;  Surgeon: Baruch Goldmann, MD;  Location: [facility];  Service: Ophthalmology;  Laterality: Right;  CDE: 9.60   CHOLECYSTECTOMY N/A 05/10/2021   Procedure: LAPAROSCOPIC CHOLECYSTECTOMY;  Surgeon: Clovis Riley, MD;  Location: [facility];  Service: General;  Laterality: N/A;   COLON RESECTION  [phone]  TUBAL LIGATION     VESICOVAGINAL FISTULA CLOSURE W/ TAH  1984    Family History  Problem Relation Age of Onset   Coronary artery disease Sister        stent x 3   Seizures Sister    Coronary artery disease Brother        stent   Hypertension Mother    Stroke Mother    Emphysema Father    Alcohol abuse Brother    Lung disease Brother    Heart attack Daughter       Controlled substance contract: 12/05/21     Review of Systems  Constitutional:  Negative for appetite change, fatigue and fever.  Eyes:  Negative for pain.  Respiratory:  Negative for chest tightness and shortness of breath.   Cardiovascular:  Negative for chest pain, palpitations and leg swelling.  Gastrointestinal:  Negative for abdominal pain, nausea and vomiting.  Endocrine: Negative for polydipsia and polyphagia.  Genitourinary:        Nocturia and increased urinary incontinence  Skin:        Lesion on front of L leg where she had skin cancer removed previously; pt states it drains at times and that a scab will form but falls off when it gets wet, then forms again.  Neurological:  Negative for dizziness, weakness and headaches.  Hematological:  Negative for adenopathy. Does not bruise/bleed easily.  All other systems reviewed and are negative.      Objective:   Physical Exam Vitals and nursing note reviewed.  Constitutional:      General: She is not in acute distress.    Appearance: Normal appearance.  HENT:     Head: Normocephalic and atraumatic.     Right Ear: Tympanic membrane normal.     Left Ear: Tympanic membrane normal.     Nose: Nose  normal.     Mouth/Throat:     Mouth: Mucous membranes are moist.     Pharynx: Oropharynx is clear.  Eyes:     Conjunctiva/sclera: Conjunctivae normal.     Pupils: Pupils are equal, round, and reactive to light.  Cardiovascular:     Rate and Rhythm: Normal rate. Rhythm irregular.     Pulses: Normal pulses.  Pulmonary:     Effort: Pulmonary effort is normal. No respiratory distress.     Breath sounds: Normal breath sounds. No wheezing, rhonchi or rales.  Abdominal:     General: Bowel sounds are normal.     Palpations: Abdomen is soft.     Tenderness: There is no abdominal tenderness.  Musculoskeletal:        General: No swelling. Normal range of motion.     Cervical back: Normal range of motion.  Lymphadenopathy:     Cervical: No cervical adenopathy.  Skin:    General: Skin is warm and dry.     Capillary Refill: Capillary refill takes less than 2 seconds.     Findings: Lesion present.     Comments: Small erythematous, papular lesion to anterior L shin; no drainage or swelling noted today.   Scabbed area from dog scratch to posterior L lower leg; no warmth, swelling or tenderness noted.  Neurological:     General: No focal deficit present.     Mental Status: She is alert and oriented to person, place, and time.  Psychiatric:        Mood and Affect: Mood normal.        Behavior: Behavior normal.  BP (!) 151/57   Pulse 63   Temp 98.2 F (36.8 C) (Temporal)   Resp 20   Ht _0  (1.549 m)   Wt 118 lb (53.5 kg)   SpO2 98%   BMI 22.30 kg/m   HgbA1c 7.8%    Assessment & Plan:   SHANIQUE ASLINGER comes in today with chief complaint of Medical Management of Chronic Issues   Diagnosis and orders addressed:  1. Essential hypertension Low sodium diet - CBC with Differential/Platelet - CMP14+EGFR - amLODipine (NORVASC) 10 MG tablet; Take 1 tablet (10 mg total) by mouth daily.  Dispense: 90 tablet; Refill: 1 - metoprolol succinate (TOPROL-XL) 25 MG 24 hr tablet; Take  1 tablet (25 mg total) by mouth daily. Take with or immediately following a meal.  Dispense: 90 tablet; Refill: 1 - olmesartan (BENICAR) 40 MG tablet; Take 1 tablet (40 mg total) by mouth daily.  Dispense: 90 tablet; Refill: 1 - potassium chloride (KLOR-CON) 10 MEQ tablet; Take 1 tablet (10 mEq total) by mouth as directed. Mon, Wed, and Fri only.  Dispense: 90 tablet; Refill: 1  2. Type 2 diabetes mellitus treated with insulin (HCC) No changes made today since pt was recently on steroids for pna; will recheck A1c in 3 months and make changes at that time if indicated. Continue to monitor blood sugar. Watch carb intake. - Bayer DCA Hb A1c Waived - Microalbumin / creatinine urine ratio  3. Hyperlipidemia, unspecified hyperlipidemia type Low fat diet - Lipid panel - atorvastatin (LIPITOR) 10 MG tablet; Take 1 tablet (10 mg total) by mouth daily. (NEEDS TO BE SEEN BEFORE NEXT REFILL)  Dispense: 90 tablet; Refill: 1  4. Recurrent major depressive disorder, in full remission ([place]) 5. GAD (generalized anxiety disorder) Stress management - LORazepam (ATIVAN) 0.5 MG tablet; Take 1 tablet (0.5 mg total) by mouth 2 (two) times daily.  Dispense: 60 tablet; Refill: 5  6. Gastroesophageal reflux disease without esophagitis Avoid spicy foods. Avoid eating 2 hours before bedtime. - pantoprazole (PROTONIX) 40 MG tablet; Take 1 tablet (40 mg total) by mouth daily. (NEEDS TO BE SEEN BEFORE NEXT REFILL)  Dispense: 90 tablet; Refill: 1  7. Late effect of cerebrovascular accident (CVA) Report s/s of stroke  8. Peripheral edema Keep legs elevated while sitting. - furosemide (LASIX) 20 MG tablet; Take 1 tablet (20 mg total) by mouth daily.  Dispense: 90 tablet; Refill: 1  9. Chronic kidney disease (CKD) stage G3a/A3, moderately decreased glomerular filtration rate (GFR) between 45-59 mL/min/1.73 square meter and albuminuria creatinine ratio greater than 300 mg/g (HCC) Labs pending  10. Elevated liver  function tests Labs pending  11. Leg lesion - Ambulatory referral to Dermatology   Labs pending Health Maintenance reviewed Diet and exercise encouraged  Follow up plan: 3 months  Collene Leyden, FNP student   [name], [place]

## 2022-09-06 LAB — CBC WITH DIFFERENTIAL/PLATELET
Basophils Absolute: 0 10*3/uL (ref 0.0–0.2)
Basos: 0 %
EOS (ABSOLUTE): 0.1 10*3/uL (ref 0.0–0.4)
Eos: 2 %
Hematocrit: 35.7 % (ref 34.0–46.6)
Hemoglobin: 11.8 g/dL (ref 11.1–15.9)
Immature Grans (Abs): 0 10*3/uL (ref 0.0–0.1)
Immature Granulocytes: 0 %
Lymphocytes Absolute: 0.9 10*3/uL (ref 0.7–3.1)
Lymphs: 15 %
MCH: 29.6 pg (ref 26.6–33.0)
MCHC: 33.1 g/dL (ref 31.5–35.7)
MCV: 90 fL (ref 79–97)
Monocytes Absolute: 0.8 10*3/uL (ref 0.1–0.9)
Monocytes: 14 %
Neutrophils Absolute: 4.2 10*3/uL (ref 1.4–7.0)
Neutrophils: 69 %
Platelets: 162 10*3/uL (ref 150–450)
RBC: 3.98 x10E6/uL (ref 3.77–5.28)
RDW: 12.8 % (ref 11.7–15.4)
WBC: 6.1 10*3/uL (ref 3.4–10.8)

## 2022-09-06 LAB — CMP14+EGFR
ALT: 11 IU/L (ref 0–32)
AST: 17 IU/L (ref 0–40)
Albumin/Globulin Ratio: 1.7 (ref 1.2–2.2)
Albumin: 4.1 g/dL (ref 3.7–4.7)
Alkaline Phosphatase: 72 IU/L (ref 44–121)
BUN/Creatinine Ratio: 13 (ref 12–28)
BUN: 16 mg/dL (ref 8–27)
Bilirubin Total: 0.5 mg/dL (ref 0.0–1.2)
CO2: 25 mmol/L (ref 20–29)
Calcium: 9.8 mg/dL (ref 8.7–10.3)
Chloride: 98 mmol/L (ref 96–106)
Creatinine, Ser: 1.23 mg/dL — ABNORMAL HIGH (ref 0.57–1.00)
Globulin, Total: 2.4 g/dL (ref 1.5–4.5)
Glucose: 152 mg/dL — ABNORMAL HIGH (ref 70–99)
Potassium: 4.3 mmol/L (ref 3.5–5.2)
Sodium: 135 mmol/L (ref 134–144)
Total Protein: 6.5 g/dL (ref 6.0–8.5)
eGFR: 43 mL/min/{1.73_m2} — ABNORMAL LOW (ref >59)

## 2022-09-06 LAB — LIPID PANEL
Chol/HDL Ratio: 3.1 ratio (ref 0.0–4.4)
Cholesterol, Total: 124 mg/dL (ref 100–199)
HDL: 40 mg/dL (ref >39)
LDL Chol Calc (NIH): 60 mg/dL (ref 0–99)
Triglycerides: 138 mg/dL (ref 0–149)
VLDL Cholesterol Cal: 24 mg/dL (ref 5–40)

## 2022-09-06 LAB — MICROALBUMIN / CREATININE URINE RATIO
Creatinine, Urine: 314.6 mg/dL
Microalb/Creat Ratio: 30 mg/g creat — ABNORMAL HIGH (ref 0–29)
Microalbumin, Urine: 94.1 ug/mL

## 2022-09-23 DIAGNOSIS — C44729 Squamous cell carcinoma of skin of left lower limb, including hip: Secondary | ICD-10-CM | POA: Diagnosis not present

## 2022-11-06 ENCOUNTER — Ambulatory Visit: Payer: Medicare Other | Admitting: Family Medicine

## 2022-11-06 ENCOUNTER — Other Ambulatory Visit: Payer: Self-pay | Admitting: Nurse Practitioner

## 2022-11-06 DIAGNOSIS — I6930 Unspecified sequelae of cerebral infarction: Secondary | ICD-10-CM

## 2022-11-06 DIAGNOSIS — Z23 Encounter for immunization: Secondary | ICD-10-CM | POA: Diagnosis not present

## 2022-11-06 DIAGNOSIS — S81819A Laceration without foreign body, unspecified lower leg, initial encounter: Secondary | ICD-10-CM | POA: Diagnosis not present

## 2022-11-06 DIAGNOSIS — F3342 Major depressive disorder, recurrent, in full remission: Secondary | ICD-10-CM

## 2022-11-08 ENCOUNTER — Encounter: Payer: Self-pay | Admitting: Nurse Practitioner

## 2022-11-08 DIAGNOSIS — L821 Other seborrheic keratosis: Secondary | ICD-10-CM | POA: Diagnosis not present

## 2022-11-08 DIAGNOSIS — C44729 Squamous cell carcinoma of skin of left lower limb, including hip: Secondary | ICD-10-CM | POA: Diagnosis not present

## 2022-11-08 DIAGNOSIS — Z85828 Personal history of other malignant neoplasm of skin: Secondary | ICD-10-CM | POA: Diagnosis not present

## 2022-11-08 DIAGNOSIS — L570 Actinic keratosis: Secondary | ICD-10-CM | POA: Diagnosis not present

## 2022-12-09 ENCOUNTER — Ambulatory Visit (INDEPENDENT_AMBULATORY_CARE_PROVIDER_SITE_OTHER): Payer: 59 | Admitting: Nurse Practitioner

## 2022-12-09 ENCOUNTER — Encounter: Payer: Self-pay | Admitting: Nurse Practitioner

## 2022-12-09 VITALS — BP 160/71 | HR 65 | Temp 97.6°F | Resp 20 | Ht 61.0 in | Wt 114.0 lb

## 2022-12-09 DIAGNOSIS — E785 Hyperlipidemia, unspecified: Secondary | ICD-10-CM | POA: Diagnosis not present

## 2022-12-09 DIAGNOSIS — I6930 Unspecified sequelae of cerebral infarction: Secondary | ICD-10-CM | POA: Diagnosis not present

## 2022-12-09 DIAGNOSIS — I10 Essential (primary) hypertension: Secondary | ICD-10-CM | POA: Diagnosis not present

## 2022-12-09 DIAGNOSIS — K219 Gastro-esophageal reflux disease without esophagitis: Secondary | ICD-10-CM

## 2022-12-09 DIAGNOSIS — N1831 Chronic kidney disease, stage 3a: Secondary | ICD-10-CM

## 2022-12-09 DIAGNOSIS — R609 Edema, unspecified: Secondary | ICD-10-CM

## 2022-12-09 DIAGNOSIS — Z794 Long term (current) use of insulin: Secondary | ICD-10-CM

## 2022-12-09 DIAGNOSIS — I129 Hypertensive chronic kidney disease with stage 1 through stage 4 chronic kidney disease, or unspecified chronic kidney disease: Secondary | ICD-10-CM

## 2022-12-09 DIAGNOSIS — E119 Type 2 diabetes mellitus without complications: Secondary | ICD-10-CM | POA: Diagnosis not present

## 2022-12-09 DIAGNOSIS — F3342 Major depressive disorder, recurrent, in full remission: Secondary | ICD-10-CM

## 2022-12-09 DIAGNOSIS — R600 Localized edema: Secondary | ICD-10-CM

## 2022-12-09 DIAGNOSIS — F411 Generalized anxiety disorder: Secondary | ICD-10-CM

## 2022-12-09 LAB — BAYER DCA HB A1C WAIVED: HB A1C (BAYER DCA - WAIVED): 7.8 % — ABNORMAL HIGH (ref 4.8–5.6)

## 2022-12-09 MED ORDER — LANTUS SOLOSTAR 100 UNIT/ML ~~LOC~~ SOPN: 34.0000 [IU] | PEN_INJECTOR | Freq: Every day | SUBCUTANEOUS | 5 refills | Status: DC

## 2022-12-09 NOTE — Progress Notes (Signed)
Subjective:    Patient ID: Kara Dean, female    DOB: [birth year], 89 y.o.   MRN: [id number]   Chief Complaint: medical management of chronic issues     HPI:  Kara Dean is a 89 y.o. who identifies as a female who was assigned female at birth.   Social history: Lives with: her daughter Work history: retired   Scientist, forensic in today for follow up of the following chronic medical issues:  1. Essential hypertension No c/o chest pain, sob or headche. Does  check blood pressure at home and systiolic runs [phone]. BP Readings from Last 3 Encounters:  09/05/22 (!) 151/57  07/29/22 (!) 140/82  07/24/22 (!) 140/70     2. Gastroesophageal reflux disease without esophagitis Is on protonix daily and is doing well.  3. Late effect of cerebrovascular accident (CVA) No permanent effects. Is on aggrenox daily and is doing well.  4. Chronic kidney disease (CKD) stage G3a/A3, moderately decreased glomerular filtration rate (GFR) between 45-59 mL/min/1.73 square meter and albuminuria creatinine ratio greater than 300 mg/g (HCC) Lab Results  Component Value Date   CREATININE 1.23 (H) 09/05/2022     5. Recurrent major depressive disorder, in full remission ([place]) Is currently on no antidepressant.    12/09/2022    2:18 PM 09/05/2022    2:28 PM 07/29/2022    3:04 PM  Depression screen PHQ 2/9  Decreased Interest '1 1 1  '$ Down, Depressed, Hopeless '1 1 1  '$ PHQ - 2 Score '2 2 2  '$ Altered sleeping 0 0 1  Tired, decreased energy '2 1 2  '$ Change in appetite 2 1 0  Feeling bad or failure about yourself  1 0 0  Trouble concentrating 1 0 1  Moving slowly or fidgety/restless 0 0 0  Suicidal thoughts 0 0 0  PHQ-9 Score '8 4 6  '$ Difficult doing work/chores Somewhat difficult Not difficult at all Not difficult at all     6. GAD (generalized anxiety disorder) Takes ativan BID.    12/09/2022    2:18 PM 09/05/2022    2:29 PM 07/29/2022    3:05 PM 03/05/2022    3:39 PM  GAD 7 : Generalized Anxiety  Score  Nervous, Anxious, on Edge 0 '1 1 1  '$ Control/stop worrying '2 1 2 3  '$ Worry too much - different things '2 1 2 3  '$ Trouble relaxing 0 0 1 2  Restless 1 0 0 0  Easily annoyed or irritable '1 1 2 '$ 0  Afraid - awful might happen 1 0 2 2  Total GAD 7 Score '7 4 10 11  '$ Anxiety Difficulty Not difficult at all Not difficult at all Not difficult at all Somewhat difficult      7. Peripheral edema Has daily lower ext edema that will resolve some at night.  8. Diabetes insulin dependent Fasting blood sugars ar running 130-200.  Lab Results  Component Value Date   HGBA1C 7.8 (H) 09/05/2022    New complaints: None today  Allergies  Allergen Reactions   Codeine Other (See Comments)    unknown   Latex Rash   Morphine Other (See Comments)    unknown   Niacin Other (See Comments)    unknown   Sulfa Antibiotics Rash   Sulfonamide Derivatives Rash   Outpatient Encounter Medications as of 12/09/2022  Medication Sig   albuterol (VENTOLIN HFA) 108 (90 Base) MCG/ACT inhaler Inhale 2 puffs into the lungs every 6 (six) hours as needed for wheezing or  shortness of breath.   amLODipine (NORVASC) 10 MG tablet Take 1 tablet (10 mg total) by mouth daily.   atorvastatin (LIPITOR) 10 MG tablet Take 1 tablet (10 mg total) by mouth daily. (NEEDS TO BE SEEN BEFORE NEXT REFILL)   B Complex Vitamins (B COMPLEX-B12) TABS Take 1 tablet by mouth daily.   Cholecalciferol (VITAMIN D3 PO) Take 1 capsule by mouth daily.   dipyridamole-aspirin (AGGRENOX) 200-25 MG 12hr capsule TAKE ONE CAPSULE ONCE DAILY   fluticasone (FLONASE) 50 MCG/ACT nasal spray Place 2 sprays into both nostrils daily.   furosemide (LASIX) 20 MG tablet Take 1 tablet (20 mg total) by mouth daily.   glucose blood (ONETOUCH ULTRA) test strip CHECK BLOOD SUGER UP TO 3 TIMES A DAY   LANTUS SOLOSTAR 100 UNIT/ML Solostar Pen Inject 30 Units into the skin daily.   LORazepam (ATIVAN) 0.5 MG tablet Take 1 tablet (0.5 mg total) by mouth 2 (two) times  daily.   Magnesium 250 MG TABS Take 250 mg by mouth daily.   metoprolol succinate (TOPROL-XL) 25 MG 24 hr tablet Take 1 tablet (25 mg total) by mouth daily. Take with or immediately following a meal.   olmesartan (BENICAR) 40 MG tablet Take 1 tablet (40 mg total) by mouth daily.   Omega-3 Fatty Acids (FISH OIL) 1000 MG CAPS Take 1 capsule by mouth daily.   pantoprazole (PROTONIX) 40 MG tablet Take 1 tablet (40 mg total) by mouth daily. (NEEDS TO BE SEEN BEFORE NEXT REFILL)   potassium chloride (KLOR-CON) 10 MEQ tablet Take 1 tablet (10 mEq total) by mouth as directed. Mon, Wed, and Fri only.   No facility-administered encounter medications on file as of 12/09/2022.    Past Surgical History:  Procedure Laterality Date   ABDOMINAL HYSTERECTOMY     BACK SURGERY  1994   CATARACT EXTRACTION W/PHACO Left 09/19/2017   Procedure: CATARACT EXTRACTION PHACO AND INTRAOCULAR LENS PLACEMENT ([place]);  Surgeon: Baruch Goldmann, MD;  Location: [facility];  Service: Ophthalmology;  Laterality: Left;  CDE: 8.10   CATARACT EXTRACTION W/PHACO Right 10/10/2017   Procedure: CATARACT EXTRACTION PHACO AND INTRAOCULAR LENS PLACEMENT RIGHT EYE;  Surgeon: Baruch Goldmann, MD;  Location: [facility];  Service: Ophthalmology;  Laterality: Right;  CDE: 9.60   CHOLECYSTECTOMY N/A 05/10/2021   Procedure: LAPAROSCOPIC CHOLECYSTECTOMY;  Surgeon: Clovis Riley, MD;  Location: [facility];  Service: General;  Laterality: N/A;   COLON RESECTION  1990's   TUBAL LIGATION     VESICOVAGINAL FISTULA CLOSURE W/ TAH  1984    Family History  Problem Relation Age of Onset   Coronary artery disease Sister        stent x 3   Seizures Sister    Coronary artery disease Brother        stent   Hypertension Mother    Stroke Mother    Emphysema Father    Alcohol abuse Brother    Lung disease Brother    Heart attack Daughter       Controlled substance contract: 12/09/22     Review of Systems  Constitutional:  Negative for diaphoresis.  Eyes:   Negative for pain.  Respiratory:  Negative for shortness of breath.   Cardiovascular:  Negative for chest pain, palpitations and leg swelling.  Gastrointestinal:  Negative for abdominal pain.  Endocrine: Negative for polydipsia.  Skin:  Negative for rash.  Neurological:  Negative for dizziness, weakness and headaches.  Hematological:  Does not bruise/bleed easily.  All other systems reviewed  and are negative.      Objective:   Physical Exam Vitals and nursing note reviewed.  Constitutional:      General: She is not in acute distress.    Appearance: Normal appearance. She is well-developed.  HENT:     Head: Normocephalic.     Right Ear: Tympanic membrane normal.     Left Ear: Tympanic membrane normal.     Nose: Nose normal.     Mouth/Throat:     Mouth: Mucous membranes are moist.  Eyes:     Pupils: Pupils are equal, round, and reactive to light.  Neck:     Vascular: No carotid bruit or JVD.  Cardiovascular:     Rate and Rhythm: Normal rate and regular rhythm.     Heart sounds: Normal heart sounds.  Pulmonary:     Effort: Pulmonary effort is normal. No respiratory distress.     Breath sounds: Normal breath sounds. No wheezing or rales.  Chest:     Chest wall: No tenderness.  Abdominal:     General: Bowel sounds are normal. There is no distension or abdominal bruit.     Palpations: Abdomen is soft. There is no hepatomegaly, splenomegaly, mass or pulsatile mass.     Tenderness: There is no abdominal tenderness.  Musculoskeletal:        General: Normal range of motion.     Cervical back: Normal range of motion and neck supple.  Lymphadenopathy:     Cervical: No cervical adenopathy.  Skin:    General: Skin is warm and dry.  Neurological:     Mental Status: She is alert and oriented to person, place, and time.     Deep Tendon Reflexes: Reflexes are normal and symmetric.  Psychiatric:        Behavior: Behavior normal.        Thought Content: Thought content normal.         Judgment: Judgment normal.     BP (!) 160/71   Pulse 65   Temp 97.6 F (36.4 C) (Temporal)   Resp 20   Ht '5\' 1"'$  (1.549 m)   Wt 114 lb (51.7 kg)   SpO2 95%   BMI 21.54 kg/m        Assessment & Plan:   Kara Dean comes in today with chief complaint of Medical Management of Chronic Issues   Diagnosis and orders addressed:  1. Essential hypertension Conitnue current meds Low sodium diet. - CBC with Differential/Platelet - CMP14+EGFR  2. Gastroesophageal reflux disease without esophagitis Avoid spicy foods Do not eat 2 hours prior to bedtime   3. Late effect of cerebrovascular accident (CVA) Fall prevention  4. Chronic kidney disease (CKD) stage G3a/A3, moderately decreased glomerular filtration rate (GFR) between 45-59 mL/min/1.73 square meter and albuminuria creatinine ratio greater than 300 mg/g (HCC) Labs pending  5. Recurrent major depressive disorder, in full remission [facility]) Stress management Find puzzle books or puzzles to occupy time and mind  6. GAD (generalized anxiety disorder)  7. Peripheral edema Elevate legs when sitting  8. Type 2 diabetes mellitus treated with insulin (HCC) Stricter arb counting Increase lantus to 34u a day Keep diary of blood sugars  - Bayer DCA Hb A1c Waived  9. Hyperlipidemia, unspecified hyperlipidemia type Low fat diet - Lipid panel  Labs pending Health Maintenance reviewed Diet and exercise encouraged  Follow up plan: 3 months   [name], FNP

## 2022-12-10 ENCOUNTER — Telehealth: Payer: Self-pay | Admitting: Nurse Practitioner

## 2022-12-10 LAB — CMP14+EGFR
ALT: 7 IU/L (ref 0–32)
AST: 15 IU/L (ref 0–40)
Albumin/Globulin Ratio: 1.4 (ref 1.2–2.2)
Albumin: 4.3 g/dL (ref 3.7–4.7)
Alkaline Phosphatase: 84 IU/L (ref 44–121)
BUN/Creatinine Ratio: 15 (ref 12–28)
BUN: 17 mg/dL (ref 8–27)
Bilirubin Total: 0.4 mg/dL (ref 0.0–1.2)
CO2: 25 mmol/L (ref 20–29)
Calcium: 10.5 mg/dL — ABNORMAL HIGH (ref 8.7–10.3)
Chloride: 100 mmol/L (ref 96–106)
Creatinine, Ser: 1.16 mg/dL — ABNORMAL HIGH (ref 0.57–1.00)
Globulin, Total: 3.1 g/dL (ref 1.5–4.5)
Glucose: 170 mg/dL — ABNORMAL HIGH (ref 70–99)
Potassium: 4.6 mmol/L (ref 3.5–5.2)
Sodium: 139 mmol/L (ref 134–144)
Total Protein: 7.4 g/dL (ref 6.0–8.5)
eGFR: 46 mL/min/{1.73_m2} — ABNORMAL LOW (ref >59)

## 2022-12-10 LAB — CBC WITH DIFFERENTIAL/PLATELET
Basophils Absolute: 0 10*3/uL (ref 0.0–0.2)
Basos: 1 %
EOS (ABSOLUTE): 0.3 10*3/uL (ref 0.0–0.4)
Eos: 3 %
Hematocrit: 39.2 % (ref 34.0–46.6)
Hemoglobin: 12.7 g/dL (ref 11.1–15.9)
Immature Grans (Abs): 0 10*3/uL (ref 0.0–0.1)
Immature Granulocytes: 0 %
Lymphocytes Absolute: 1.2 10*3/uL (ref 0.7–3.1)
Lymphs: 14 %
MCH: 28.4 pg (ref 26.6–33.0)
MCHC: 32.4 g/dL (ref 31.5–35.7)
MCV: 88 fL (ref 79–97)
Monocytes Absolute: 0.7 10*3/uL (ref 0.1–0.9)
Monocytes: 8 %
Neutrophils Absolute: 6.5 10*3/uL (ref 1.4–7.0)
Neutrophils: 74 %
Platelets: 231 10*3/uL (ref 150–450)
RBC: 4.47 x10E6/uL (ref 3.77–5.28)
RDW: 14.5 % (ref 11.7–15.4)
WBC: 8.7 10*3/uL (ref 3.4–10.8)

## 2022-12-10 LAB — LIPID PANEL
Chol/HDL Ratio: 3.3 ratio (ref 0.0–4.4)
Cholesterol, Total: 153 mg/dL (ref 100–199)
HDL: 46 mg/dL (ref >39)
LDL Chol Calc (NIH): 76 mg/dL (ref 0–99)
Triglycerides: 182 mg/dL — ABNORMAL HIGH (ref 0–149)
VLDL Cholesterol Cal: 31 mg/dL (ref 5–40)

## 2022-12-10 MED ORDER — PX EXTRA SHORT PEN NEEDLES 31G X 6 MM MISC: 5 refills | Status: DC

## 2022-12-10 NOTE — Telephone Encounter (Signed)
Patient aware prescription was sent to pharmacy.  

## 2022-12-10 NOTE — Telephone Encounter (Signed)
Pt needs needles for insulin sent to [facility].   75m 31g Disposable Ultra Short Unified Pen Tip is what she says she uses.

## 2022-12-26 DIAGNOSIS — E1142 Type 2 diabetes mellitus with diabetic polyneuropathy: Secondary | ICD-10-CM | POA: Diagnosis not present

## 2022-12-26 DIAGNOSIS — L603 Nail dystrophy: Secondary | ICD-10-CM | POA: Diagnosis not present

## 2022-12-26 DIAGNOSIS — L84 Corns and callosities: Secondary | ICD-10-CM | POA: Diagnosis not present

## 2022-12-26 DIAGNOSIS — M79676 Pain in unspecified toe(s): Secondary | ICD-10-CM | POA: Diagnosis not present

## 2023-02-17 ENCOUNTER — Ambulatory Visit (INDEPENDENT_AMBULATORY_CARE_PROVIDER_SITE_OTHER): Payer: 59 | Admitting: Nurse Practitioner

## 2023-02-17 ENCOUNTER — Encounter: Payer: Self-pay | Admitting: Nurse Practitioner

## 2023-02-17 VITALS — BP 168/66 | HR 66 | Temp 98.0°F | Resp 20 | Ht 61.0 in | Wt 116.0 lb

## 2023-02-17 DIAGNOSIS — N3946 Mixed incontinence: Secondary | ICD-10-CM

## 2023-02-17 DIAGNOSIS — F411 Generalized anxiety disorder: Secondary | ICD-10-CM | POA: Diagnosis not present

## 2023-02-17 LAB — URINALYSIS, COMPLETE
Bilirubin, UA: NEGATIVE
Glucose, UA: NEGATIVE
Nitrite, UA: NEGATIVE
RBC, UA: NEGATIVE
Specific Gravity, UA: >1.03 — ABNORMAL HIGH (ref 1.005–1.030)
Urobilinogen, Ur: 0.2 mg/dL (ref 0.2–1.0)
pH, UA: 5.5 (ref 5.0–7.5)

## 2023-02-17 LAB — MICROSCOPIC EXAMINATION
RBC, Urine: NONE SEEN /hpf (ref 0–2)
Renal Epithel, UA: NONE SEEN /hpf

## 2023-02-17 MED ORDER — CITALOPRAM HYDROBROMIDE 20 MG PO TABS: 20.0000 mg | ORAL_TABLET | Freq: Every day | ORAL | 5 refills | Status: DC

## 2023-02-17 MED ORDER — TOLTERODINE TARTRATE ER 4 MG PO CP24: 4.0000 mg | ORAL_CAPSULE | Freq: Every day | ORAL | 1 refills | Status: DC

## 2023-02-17 NOTE — Progress Notes (Signed)
Subjective:    Patient ID: Kara Dean, female    DOB: Dec 06, 1932, 89 y.o.   MRN: [id number]   Chief Complaint: bladderr problems   HPI  Patient Active Problem List   Diagnosis Date Noted   Elevated liver function tests    Choledocholithiasis with acute cholecystitis 05/07/2021   Peripheral edema 03/18/2017   Chronic kidney disease (CKD) stage G3a/A3, moderately decreased glomerular filtration rate (GFR) between 45-59 mL/min/1.73 square meter and albuminuria creatinine ratio greater than 300 mg/g (HCC) 07/27/2014   Gastroesophageal reflux disease without esophagitis 04/27/2014   GAD (generalized anxiety disorder) 04/27/2014   Late effect of cerebrovascular accident (CVA) 03/26/2010   Depression 02/01/2009   Essential hypertension 02/01/2009   Osteoarthritis 02/01/2009   Patient come sin c/o incontinenece. She is not able to mak eit to the restroo. Has been going on for over a year. The past coupe of months she cannot cntrole her pee. Is always peeing on herself.  Her daughter states her depression has worsened. She and her daughter recently moved and she worries all the time that something will happen.    02/17/2023    9:05 AM 12/09/2022    2:18 PM 09/05/2022    2:28 PM  Depression screen PHQ 2/9  Decreased Interest Down, Depressed, Hopeless PHQ - 2 Score Altered sleeping 0 0 0  Tired, decreased energy Change in appetite Feeling bad or failure about yourself  2 1 0  Trouble concentrating 0 1 0  Moving slowly or fidgety/restless 2 0 0  Suicidal thoughts 0 0 0  PHQ-9 Score Difficult doing work/chores Somewhat difficult Somewhat difficult Not difficult at all      02/17/2023    9:06 AM 12/09/2022    2:18 PM 09/05/2022    2:29 PM 07/29/2022    3:05 PM  GAD 7 : Generalized Anxiety Score  Nervous, Anxious, on Edge 3 0 1 1  Control/stop worrying Worry too much - different things Trouble relaxing 2 0 0 1   Restless 0 1 0 0  Easily annoyed or irritable Afraid - awful might happen 3 1 0 2  Total GAD 7 Score Anxiety Difficulty Somewhat difficult Not difficult at all Not difficult at all Not difficult at all     Review of Systems  Constitutional:  Negative for diaphoresis.  Eyes:  Negative for pain.  Respiratory:  Negative for shortness of breath.   Cardiovascular:  Negative for chest pain, palpitations and leg swelling.  Gastrointestinal:  Negative for abdominal pain.  Endocrine: Negative for polydipsia.  Skin:  Negative for rash.  Neurological:  Negative for dizziness, weakness and headaches.  Hematological:  Does not bruise/bleed easily.  All other systems reviewed and are negative.      Objective:   Physical Exam Constitutional:      Appearance: Normal appearance.  Cardiovascular:     Rate and Rhythm: Normal rate and regular rhythm.     Heart sounds: Normal heart sounds.  Pulmonary:     Effort: Pulmonary effort is normal.     Breath sounds: Normal breath sounds.  Skin:    General: Skin is warm.  Neurological:     General: No focal deficit present.     Mental Status: She is alert  and oriented to person, place, and time.  Psychiatric:        Mood and Affect: Mood normal.        Behavior: Behavior normal.     BP (!) 168/66   Pulse 66   Temp 98 F (36.7 C) (Temporal)   Resp 20   Ht 5\' 1"  (1.549 m)   Wt 116 lb (52.6 kg)   SpO2 96%   BMI 21.92 kg/m        Assessment & Plan:   Kara Dean in today with chief complaint of bladderr problems   1. Mixed stress and urge urinary incontinence Try to go to the restroom every 2 hours during the day No liquids after  7pm - Urinalysis, Complete - Urine Culture  2. GAD (generalized anxiety disorder) Stress management - citalopram (CELEXA) 20 MG tablet; Take 1 tablet (20 mg total) by mouth daily.  Dispense: 30 tablet; Refill: 5    The above assessment and management plan was discussed with  the patient. The patient verbalized understanding of and has agreed to the management plan. Patient is aware to call the clinic if symptoms persist or worsen. Patient is aware when to return to the clinic for a follow-up visit. Patient educated on when it is appropriate to go to the emergency department.   Mary-Margaret Daphine Deutscher, FNP

## 2023-02-17 NOTE — Patient Instructions (Signed)
Urinary Incontinence Urinary incontinence refers to a condition in which a person is unable to control where and when to pass urine. A person with this condition will urinate involuntarily. This means that the person urinates when he or she does not mean to. What are the causes? This condition may be caused by: Medicines. Infections. Constipation. Overactive bladder muscles. Weak bladder muscles. Weak pelvic floor muscles. These muscles provide support for the bladder, intestine, and, in women, the uterus. Enlarged prostate in men. The prostate is a gland near the bladder. When it gets too big, it can pinch the urethra. With the urethra blocked, the bladder can weaken and lose the ability to empty properly. Surgery. Emotional factors, such as anxiety, stress, or post-traumatic stress disorder (PTSD). Spinal cord injury, nerve injury, or other neurological conditions. Pelvic organ prolapse. This happens in women when organs move out of place and into the vagina. This movement can prevent the bladder and urethra from working properly. What increases the risk? The following factors may make you more likely to develop this condition: Age. The older you are, the higher the risk. Obesity. Being physically inactive. Pregnancy and childbirth. Menopause. Diseases that affect the nerves or spinal cord. Long-term, or chronic, coughing. This can increase pressure on the bladder and pelvic floor muscles. What are the signs or symptoms? Symptoms may vary depending on the type of urinary incontinence you have. They include: A sudden urge to urinate, and passing urine involuntarily before you can get to a bathroom (urge incontinence). Suddenly passing urine when doing activities that force urine to pass, such as coughing, laughing, exercising, or sneezing (stress incontinence). Needing to urinate often but urinating only a small amount, or constantly dribbling urine (overflow incontinence). Urinating  because you cannot get to the bathroom in time due to a physical disability, such as arthritis or injury, or due to a communication or thinking problem, such as Alzheimer's disease (functional incontinence). How is this diagnosed? This condition may be diagnosed based on: Your medical history. A physical exam. Tests, such as: Urine tests. X-rays of your kidney and bladder. Ultrasound. CT scan. Cystoscopy. In this procedure, a health care provider inserts a tube with a light and camera (cystoscope) through the urethra and into the bladder to check for problems. Urodynamic testing. These tests assess how well the bladder, urethra, and sphincter can store and release urine. There are different types of urodynamic tests, and they vary depending on what the test is measuring. To help diagnose your condition, your health care provider may recommend that you keep a log of when you urinate and how much you urinate. How is this treated? Treatment for this condition depends on the type of incontinence that you have and its cause. Treatment may include: Lifestyle changes, such as: Quitting smoking. Maintaining a healthy weight. Staying active. Try to get 150 minutes of moderate-intensity exercise every week. Ask your health care provider which activities are safe for you. Eating a healthy diet. Avoid high-fat foods, like fried foods. Avoid refined carbohydrates like white bread and white rice. Limit how much alcohol and caffeine you drink. Increase your fiber intake. Healthy sources of fiber include beans, whole grains, and fresh fruits and vegetables. Behavioral changes, such as: Pelvic floor muscle exercises. Bladder training, such as lengthening the amount of time between bathroom breaks, or using the bathroom at regular intervals. Using techniques to suppress bladder urges. This can include distraction techniques or controlled breathing exercises. Medicines, such as: Medicines to relax the  bladder   muscles and prevent bladder spasms. Medicines to help slow or prevent the growth of a man's prostate. Botox injections. These can help relax the bladder muscles. Treatments, such as: Using pulses of electricity to help change bladder reflexes (electrical nerve stimulation). For women, using a medical device to prevent urine leaks. This is a small, tampon-like, disposable device that is inserted into the urethra. Injecting collagen or carbon beads (bulking agents) into the urinary sphincter. These can help thicken tissue and close the bladder opening. Surgery. Follow these instructions at home: Lifestyle Limit alcohol and caffeine. These can fill your bladder quickly and irritate it. Keep yourself clean to help prevent odors and skin damage. Ask your health care provider about special skin creams and cleansers that can protect the skin from urine. Consider wearing pads or adult diapers. Make sure to change them regularly, and always change them right after experiencing incontinence. General instructions Take over-the-counter and prescription medicines only as told by your health care provider. Use the bathroom about every 3-4 hours, even if you do not feel the need to urinate. Try to empty your bladder completely every time. After urinating, wait a minute. Then try to urinate again. Make sure you are in a relaxed position while urinating. If your incontinence is caused by nerve problems, keep a log of the medicines you take and the times you go to the bathroom. Keep all follow-up visits. This is important. Where to find more information National Institute of Diabetes and Digestive and Kidney Diseases: www.niddk.nih.gov American Urology Association: www.urologyhealth.org Contact a health care provider if: You have pain that gets worse. Your incontinence gets worse. Get help right away if: You have a fever or chills. You are unable to urinate. You have redness in your groin area or  down your legs. Summary Urinary incontinence refers to a condition in which a person is unable to control where and when to pass urine. This condition may be caused by medicines, infection, weak bladder muscles, weak pelvic floor muscles, enlargement of the prostate (in men), or surgery. Factors such as older age, obesity, pregnancy and childbirth, menopause, neurological diseases, and chronic coughing may increase your risk for developing this condition. Types of urinary incontinence include urge incontinence, stress incontinence, overflow incontinence, and functional incontinence. This condition is usually treated first with lifestyle and behavioral changes, such as quitting smoking, eating a healthier diet, and doing regular pelvic floor exercises. Other treatment options include medicines, bulking agents, medical devices, electrical nerve stimulation, or surgery. This information is not intended to replace advice given to you by your health care provider. Make sure you discuss any questions you have with your health care provider. Document Revised: 05/26/2020 Document Reviewed: 05/26/2020 Elsevier Patient Education  2023 [employer]  

## 2023-02-18 LAB — URINE CULTURE

## 2023-03-06 DIAGNOSIS — E1142 Type 2 diabetes mellitus with diabetic polyneuropathy: Secondary | ICD-10-CM | POA: Diagnosis not present

## 2023-03-06 DIAGNOSIS — M79676 Pain in unspecified toe(s): Secondary | ICD-10-CM | POA: Diagnosis not present

## 2023-03-06 DIAGNOSIS — L603 Nail dystrophy: Secondary | ICD-10-CM | POA: Diagnosis not present

## 2023-03-06 DIAGNOSIS — L84 Corns and callosities: Secondary | ICD-10-CM | POA: Diagnosis not present

## 2023-03-07 ENCOUNTER — Other Ambulatory Visit: Payer: Self-pay | Admitting: Nurse Practitioner

## 2023-03-07 ENCOUNTER — Encounter: Payer: Self-pay | Admitting: Nurse Practitioner

## 2023-03-07 ENCOUNTER — Ambulatory Visit (INDEPENDENT_AMBULATORY_CARE_PROVIDER_SITE_OTHER): Payer: 59 | Admitting: Nurse Practitioner

## 2023-03-07 VITALS — BP 135/56 | HR 63 | Temp 98.1°F | Resp 20 | Ht 61.0 in | Wt 122.0 lb

## 2023-03-07 DIAGNOSIS — I10 Essential (primary) hypertension: Secondary | ICD-10-CM | POA: Diagnosis not present

## 2023-03-07 DIAGNOSIS — R600 Localized edema: Secondary | ICD-10-CM

## 2023-03-07 DIAGNOSIS — Z794 Long term (current) use of insulin: Secondary | ICD-10-CM | POA: Diagnosis not present

## 2023-03-07 DIAGNOSIS — E785 Hyperlipidemia, unspecified: Secondary | ICD-10-CM

## 2023-03-07 DIAGNOSIS — N1831 Chronic kidney disease, stage 3a: Secondary | ICD-10-CM

## 2023-03-07 DIAGNOSIS — E1169 Type 2 diabetes mellitus with other specified complication: Secondary | ICD-10-CM | POA: Diagnosis not present

## 2023-03-07 DIAGNOSIS — E1122 Type 2 diabetes mellitus with diabetic chronic kidney disease: Secondary | ICD-10-CM

## 2023-03-07 DIAGNOSIS — I6930 Unspecified sequelae of cerebral infarction: Secondary | ICD-10-CM

## 2023-03-07 DIAGNOSIS — F411 Generalized anxiety disorder: Secondary | ICD-10-CM

## 2023-03-07 DIAGNOSIS — F3342 Major depressive disorder, recurrent, in full remission: Secondary | ICD-10-CM

## 2023-03-07 DIAGNOSIS — E119 Type 2 diabetes mellitus without complications: Secondary | ICD-10-CM | POA: Diagnosis not present

## 2023-03-07 DIAGNOSIS — K219 Gastro-esophageal reflux disease without esophagitis: Secondary | ICD-10-CM

## 2023-03-07 LAB — BAYER DCA HB A1C WAIVED: HB A1C (BAYER DCA - WAIVED): 7.8 % — ABNORMAL HIGH (ref 4.8–5.6)

## 2023-03-07 MED ORDER — ATORVASTATIN CALCIUM 10 MG PO TABS: 10.0000 mg | ORAL_TABLET | Freq: Every day | ORAL | 1 refills | Status: DC

## 2023-03-07 MED ORDER — PANTOPRAZOLE SODIUM 40 MG PO TBEC
40.0000 mg | DELAYED_RELEASE_TABLET | Freq: Every day | ORAL | 1 refills | Status: DC
Start: 2023-03-07 — End: 2023-09-05

## 2023-03-07 MED ORDER — FUROSEMIDE 20 MG PO TABS: 20.0000 mg | ORAL_TABLET | Freq: Every day | ORAL | 1 refills | Status: DC

## 2023-03-07 MED ORDER — LANTUS SOLOSTAR 100 UNIT/ML ~~LOC~~ SOPN
40.0000 [IU] | PEN_INJECTOR | Freq: Every day | SUBCUTANEOUS | 5 refills | Status: DC
Start: 2023-03-07 — End: 2023-09-05

## 2023-03-07 MED ORDER — AMLODIPINE BESYLATE 10 MG PO TABS: 10.0000 mg | ORAL_TABLET | Freq: Every day | ORAL | 1 refills | Status: DC

## 2023-03-07 MED ORDER — METOPROLOL SUCCINATE ER 25 MG PO TB24
25.0000 mg | ORAL_TABLET | Freq: Every day | ORAL | 1 refills | Status: DC
Start: 2023-03-07 — End: 2023-09-04

## 2023-03-07 MED ORDER — LORAZEPAM 0.5 MG PO TABS
0.5000 mg | ORAL_TABLET | Freq: Two times a day (BID) | ORAL | 5 refills | Status: DC
Start: 2023-03-07 — End: 2023-09-05

## 2023-03-07 MED ORDER — ASPIRIN-DIPYRIDAMOLE ER 25-200 MG PO CP12
1.0000 | ORAL_CAPSULE | Freq: Every day | ORAL | 1 refills | Status: DC
Start: 2023-03-07 — End: 2023-09-05

## 2023-03-07 MED ORDER — OLMESARTAN MEDOXOMIL 40 MG PO TABS
40.0000 mg | ORAL_TABLET | Freq: Every day | ORAL | 1 refills | Status: DC
Start: 2023-03-07 — End: 2023-09-05

## 2023-03-07 MED ORDER — POTASSIUM CHLORIDE ER 10 MEQ PO TBCR: 10.0000 meq | EXTENDED_RELEASE_TABLET | ORAL | 1 refills | Status: DC

## 2023-03-07 MED ORDER — CITALOPRAM HYDROBROMIDE 20 MG PO TABS
20.0000 mg | ORAL_TABLET | Freq: Every day | ORAL | 1 refills | Status: DC
Start: 2023-03-07 — End: 2023-09-05

## 2023-03-07 NOTE — Patient Instructions (Signed)

## 2023-03-07 NOTE — Progress Notes (Signed)
Subjective:    Patient ID: Kara Dean, female    DOB: [birth year], 89 y.o.   MRN: [id number]   Chief Complaint: medical management of chronic issues     HPI:  Kara Dean is a 89 y.o. who identifies as a female who was assigned female at birth.   Social history: Lives with: lives with her daughter Work history: retired   Water engineer in today for follow up of the following chronic medical issues:  1. Hypertension No c/o chest pain, sob or headache. Does not check blood pressure at home. BP Readings from Last 3 Encounters:  03/07/23 (!) 135/56  02/17/23 (!) 168/66  12/09/22 (!) 160/71   2. Hyperlipidemia Doe snot watch diet very closely- does no dedicated exercise. Lab Results  Component Value Date   CHOL 153 12/09/2022   HDL 46 12/09/2022   LDLCALC 76 12/09/2022   TRIG 182 (H) 12/09/2022   CHOLHDL 3.3 12/09/2022   3. Diabetes Is on lantus. We increased dose at last visit. Blood sugars have been running over 200. Lab Results  Component Value Date   HGBA1C 7.8 (H) 12/09/2022     4. Chronic kidney disease (CKD) stage G3a/A3, moderately decreased glomerular filtration rate (GFR) between 45-59 mL/min/1.73 square meter and albuminuria creatinine ratio greater than 300 mg/g (HCC) Denies any voiding issues or SOB Lab Results  Component Value Date   CREATININE 1.16 (H) 12/09/2022     5. Gastroesophageal reflux disease without esophagitis Is on protonix daily to prevent symptoms.  6. Late effect of cerebrovascular accident (CVA) No permeanent effects  7. Peripheral edema Has some lower ext edema by the end of the day.  8. Recurrent major depressive disorder, in full remission (HCC) Is on celexa daily and is doing well.    03/07/2023    2:02 PM 02/17/2023    9:05 AM 12/09/2022    2:18 PM  Depression screen PHQ 2/9  Decreased Interest 0 3 1  Down, Depressed, Hopeless 0 3 1  PHQ - 2 Score 0 6 2  Altered sleeping 0 0 0  Tired, decreased energy 1 3 2   Change in  appetite 0 3 2  Feeling bad or failure about yourself  0 2 1  Trouble concentrating 0 0 1  Moving slowly or fidgety/restless 0 2 0  Suicidal thoughts 0 0 0  PHQ-9 Score 1 16 8   Difficult doing work/chores Not difficult at all Somewhat difficult Somewhat difficult     9. GAD (generalized anxiety disorder) Takes ativan BID. Worries a lot    03/07/2023    2:03 PM 02/17/2023    9:06 AM 12/09/2022    2:18 PM 09/05/2022    2:29 PM  GAD 7 : Generalized Anxiety Score  Nervous, Anxious, on Edge 1 3 0 1  Control/stop worrying 1 3 2 1   Worry too much - different things 1 3 2 1   Trouble relaxing 0 2 0 0  Restless 0 0 1 0  Easily annoyed or irritable 0 3 1 1   Afraid - awful might happen 0 3 1 0  Total GAD 7 Score 3 17 7 4   Anxiety Difficulty  Somewhat difficult Not difficult at all Not difficult at all       New complaints: None today  Allergies  Allergen Reactions   Codeine Other (See Comments)    unknown   Latex Rash   Morphine Other (See Comments)    unknown   Niacin Other (See Comments)  unknown   Sulfa Antibiotics Rash   Sulfonamide Derivatives Rash   Outpatient Encounter Medications as of 03/07/2023  Medication Sig   albuterol (VENTOLIN HFA) 108 (90 Base) MCG/ACT inhaler Inhale 2 puffs into the lungs every 6 (six) hours as needed for wheezing or shortness of breath.   amLODipine (NORVASC) 10 MG tablet Take 1 tablet (10 mg total) by mouth daily.   atorvastatin (LIPITOR) 10 MG tablet Take 1 tablet (10 mg total) by mouth daily. (NEEDS TO BE SEEN BEFORE NEXT REFILL)   B Complex Vitamins (B COMPLEX-B12) TABS Take 1 tablet by mouth daily.   Cholecalciferol (VITAMIN D3 PO) Take 1 capsule by mouth daily.   citalopram (CELEXA) 20 MG tablet Take 1 tablet (20 mg total) by mouth daily.   dipyridamole-aspirin (AGGRENOX) 200-25 MG 12hr capsule TAKE ONE CAPSULE ONCE DAILY   fluticasone (FLONASE) 50 MCG/ACT nasal spray Place 2 sprays into both nostrils daily.   furosemide (LASIX) 20 MG  tablet Take 1 tablet (20 mg total) by mouth daily.   glucose blood (ONETOUCH ULTRA) test strip CHECK BLOOD SUGER UP TO 3 TIMES A DAY   Insulin Pen Needle (PX EXTRA SHORT PEN NEEDLES) 31G X 6 MM MISC Use as directed   LANTUS SOLOSTAR 100 UNIT/ML Solostar Pen Inject 34 Units into the skin daily.   LORazepam (ATIVAN) 0.5 MG tablet Take 1 tablet (0.5 mg total) by mouth 2 (two) times daily.   Magnesium 250 MG TABS Take 250 mg by mouth daily.   metoprolol succinate (TOPROL-XL) 25 MG 24 hr tablet Take 1 tablet (25 mg total) by mouth daily. Take with or immediately following a meal.   olmesartan (BENICAR) 40 MG tablet Take 1 tablet (40 mg total) by mouth daily.   Omega-3 Fatty Acids (FISH OIL) 1000 MG CAPS Take 1 capsule by mouth daily.   pantoprazole (PROTONIX) 40 MG tablet Take 1 tablet (40 mg total) by mouth daily. (NEEDS TO BE SEEN BEFORE NEXT REFILL)   potassium chloride (KLOR-CON) 10 MEQ tablet Take 1 tablet (10 mEq total) by mouth as directed. Mon, Wed, and Fri only.   tolterodine (DETROL LA) 4 MG 24 hr capsule Take 1 capsule (4 mg total) by mouth daily.   No facility-administered encounter medications on file as of 03/07/2023.    Past Surgical History:  Procedure Laterality Date   ABDOMINAL HYSTERECTOMY     BACK SURGERY  1994   CATARACT EXTRACTION W/PHACO Left 09/19/2017   Procedure: CATARACT EXTRACTION PHACO AND INTRAOCULAR LENS PLACEMENT (IOC);  Surgeon: Fabio Pierce, MD;  Location: [facility];  Service: Ophthalmology;  Laterality: Left;  CDE: 8.10   CATARACT EXTRACTION W/PHACO Right 10/10/2017   Procedure: CATARACT EXTRACTION PHACO AND INTRAOCULAR LENS PLACEMENT RIGHT EYE;  Surgeon: Fabio Pierce, MD;  Location: [facility];  Service: Ophthalmology;  Laterality: Right;  CDE: 9.60   CHOLECYSTECTOMY N/A 05/10/2021   Procedure: LAPAROSCOPIC CHOLECYSTECTOMY;  Surgeon: Berna Bue, MD;  Location: [facility];  Service: General;  Laterality: N/A;   COLON RESECTION  1990's   TUBAL LIGATION      VESICOVAGINAL FISTULA CLOSURE W/ TAH  1984    Family History  Problem Relation Age of Onset   Coronary artery disease Sister        stent x 3   Seizures Sister    Coronary artery disease Brother        stent   Hypertension Mother    Stroke Mother    Emphysema Father    Alcohol abuse Brother  Lung disease Brother    Heart attack Daughter       Controlled substance contract: n/a     Review of Systems  Constitutional:  Negative for diaphoresis.  Eyes:  Negative for pain.  Respiratory:  Negative for shortness of breath.   Cardiovascular:  Negative for chest pain, palpitations and leg swelling.  Gastrointestinal:  Negative for abdominal pain.  Endocrine: Negative for polydipsia.  Skin:  Negative for rash.  Neurological:  Negative for dizziness, weakness and headaches.  Hematological:  Does not bruise/bleed easily.  All other systems reviewed and are negative.      Objective:   Physical Exam Vitals and nursing note reviewed.  Constitutional:      General: She is not in acute distress.    Appearance: Normal appearance. She is well-developed.  HENT:     Head: Normocephalic.     Right Ear: Tympanic membrane normal.     Left Ear: Tympanic membrane normal.     Nose: Nose normal.     Mouth/Throat:     Mouth: Mucous membranes are moist.  Eyes:     Pupils: Pupils are equal, round, and reactive to light.  Neck:     Vascular: No carotid bruit or JVD.  Cardiovascular:     Rate and Rhythm: Normal rate and regular rhythm.     Heart sounds: Normal heart sounds.  Pulmonary:     Effort: Pulmonary effort is normal. No respiratory distress.     Breath sounds: Rales (bil lower lobes) present. No wheezing.  Chest:     Chest wall: No tenderness.  Abdominal:     General: Bowel sounds are normal. There is no distension or abdominal bruit.     Palpations: Abdomen is soft. There is no hepatomegaly, splenomegaly, mass or pulsatile mass.     Tenderness: There is no abdominal  tenderness.  Musculoskeletal:        General: Normal range of motion.     Cervical back: Normal range of motion and neck supple.     Right lower leg: Edema (1+) present.     Left lower leg: Edema (1+) present.  Lymphadenopathy:     Cervical: No cervical adenopathy.  Skin:    General: Skin is warm and dry.  Neurological:     Mental Status: She is alert and oriented to person, place, and time.     Deep Tendon Reflexes: Reflexes are normal and symmetric.  Psychiatric:        Behavior: Behavior normal.        Thought Content: Thought content normal.        Judgment: Judgment normal.     BP (!) 135/56   Pulse 63   Temp 98.1 F (36.7 C) (Temporal)   Resp 20   Ht 5\' 1"  (1.549 m)   Wt 122 lb (55.3 kg)   SpO2 100%   BMI 23.05 kg/m   Hgba1c 7.8%     Assessment & Plan:   Kara Dean comes in today with chief complaint of Medical Management of Chronic Issues   Diagnosis and orders addressed:  1. Essential hypertension Low sodium diet - CBC with Differential/Platelet - CMP14+EGFR - amLODipine (NORVASC) 10 MG tablet; Take 1 tablet (10 mg total) by mouth daily.  Dispense: 90 tablet; Refill: 1 - metoprolol succinate (TOPROL-XL) 25 MG 24 hr tablet; Take 1 tablet (25 mg total) by mouth daily. Take with or immediately following a meal.  Dispense: 90 tablet; Refill: 1 - olmesartan (BENICAR) 40 MG  tablet; Take 1 tablet (40 mg total) by mouth daily.  Dispense: 90 tablet; Refill: 1 - potassium chloride (KLOR-CON) 10 MEQ tablet; Take 1 tablet (10 mEq total) by mouth as directed. Mon, Wed, and Fri only.  Dispense: 90 tablet; Refill: 1  2. Hyperlipidemia associated with type 2 diabetes mellitus (HCC) Low fat diet - Lipid panel - atorvastatin (LIPITOR) 10 MG tablet; Take 1 tablet (10 mg total) by mouth daily. (NEEDS TO BE SEEN BEFORE NEXT REFILL)  Dispense: 90 tablet; Refill: 1  3. Type 2 diabetes mellitus treated with insulin (HCC) Stricter carb counting Increase lantus to 40u  daily - Bayer DCA Hb A1c Waived - LANTUS SOLOSTAR 100 UNIT/ML Solostar Pen; Inject 40 Units into the skin daily.  Dispense: 15 mL; Refill: 5  4. Chronic kidney disease (CKD) stage G3a/A3, moderately decreased glomerular filtration rate (GFR) between 45-59 mL/min/1.73 square meter and albuminuria creatinine ratio greater than 300 mg/g (HCC) Labs pending  5. Gastroesophageal reflux disease without esophagitis Avoid spicy foods Do not eat 2 hours prior to bedtime - pantoprazole (PROTONIX) 40 MG tablet; Take 1 tablet (40 mg total) by mouth daily. (NEEDS TO BE SEEN BEFORE NEXT REFILL)  Dispense: 90 tablet; Refill: 1  6. Late effect of cerebrovascular accident (CVA) - dipyridamole-aspirin (AGGRENOX) 200-25 MG 12hr capsule; Take 1 capsule by mouth daily.  Dispense: 90 capsule; Refill: 1  7. Peripheral edema Elevate legs when sitting - furosemide (LASIX) 20 MG tablet; Take 1 tablet (20 mg total) by mouth daily.  Dispense: 90 tablet; Refill: 1  8. Recurrent major depressive disorder, in full remission (HCC) Stress management  9. GAD (generalized anxiety disorder) - citalopram (CELEXA) 20 MG tablet; Take 1 tablet (20 mg total) by mouth daily.  Dispense: 90 tablet; Refill: 1 - LORazepam (ATIVAN) 0.5 MG tablet; Take 1 tablet (0.5 mg total) by mouth 2 (two) times daily.  Dispense: 60 tablet; Refill: 5   Labs pending Health Maintenance reviewed Diet and exercise encouraged  Follow up plan: 3 months   Kara Daphine Deutscher, Kara Dean

## 2023-03-08 LAB — LIPID PANEL
Chol/HDL Ratio: 2.5 ratio (ref 0.0–4.4)
Cholesterol, Total: 114 mg/dL (ref 100–199)
HDL: 45 mg/dL (ref >39)
LDL Chol Calc (NIH): 49 mg/dL (ref 0–99)
Triglycerides: 111 mg/dL (ref 0–149)
VLDL Cholesterol Cal: 20 mg/dL (ref 5–40)

## 2023-03-08 LAB — CMP14+EGFR
ALT: 6 IU/L (ref 0–32)
AST: 15 IU/L (ref 0–40)
Albumin/Globulin Ratio: 1.7 (ref 1.2–2.2)
Albumin: 4.1 g/dL (ref 3.7–4.7)
Alkaline Phosphatase: 74 IU/L (ref 44–121)
BUN/Creatinine Ratio: 13 (ref 12–28)
BUN: 17 mg/dL (ref 8–27)
Bilirubin Total: 0.6 mg/dL (ref 0.0–1.2)
CO2: 23 mmol/L (ref 20–29)
Calcium: 9.8 mg/dL (ref 8.7–10.3)
Chloride: 101 mmol/L (ref 96–106)
Creatinine, Ser: 1.33 mg/dL — ABNORMAL HIGH (ref 0.57–1.00)
Globulin, Total: 2.4 g/dL (ref 1.5–4.5)
Glucose: 202 mg/dL — ABNORMAL HIGH (ref 70–99)
Potassium: 4.6 mmol/L (ref 3.5–5.2)
Sodium: 138 mmol/L (ref 134–144)
Total Protein: 6.5 g/dL (ref 6.0–8.5)
eGFR: 39 mL/min/{1.73_m2} — ABNORMAL LOW (ref >59)

## 2023-03-08 LAB — CBC WITH DIFFERENTIAL/PLATELET
Basophils Absolute: 0 10*3/uL (ref 0.0–0.2)
Basos: 0 %
EOS (ABSOLUTE): 0.3 10*3/uL (ref 0.0–0.4)
Eos: 3 %
Hematocrit: 36.5 % (ref 34.0–46.6)
Hemoglobin: 12 g/dL (ref 11.1–15.9)
Immature Grans (Abs): 0 10*3/uL (ref 0.0–0.1)
Immature Granulocytes: 0 %
Lymphocytes Absolute: 1.2 10*3/uL (ref 0.7–3.1)
Lymphs: 12 %
MCH: 30 pg (ref 26.6–33.0)
MCHC: 32.9 g/dL (ref 31.5–35.7)
MCV: 91 fL (ref 79–97)
Monocytes Absolute: 0.9 10*3/uL (ref 0.1–0.9)
Monocytes: 9 %
Neutrophils Absolute: 7.5 10*3/uL — ABNORMAL HIGH (ref 1.4–7.0)
Neutrophils: 76 %
Platelets: 213 10*3/uL (ref 150–450)
RBC: 4 x10E6/uL (ref 3.77–5.28)
RDW: 14 % (ref 11.7–15.4)
WBC: 10 10*3/uL (ref 3.4–10.8)

## 2023-03-12 ENCOUNTER — Ambulatory Visit (INDEPENDENT_AMBULATORY_CARE_PROVIDER_SITE_OTHER): Payer: 59

## 2023-03-12 VITALS — Ht 60.0 in | Wt 120.0 lb

## 2023-03-12 DIAGNOSIS — Z0000 Encounter for general adult medical examination without abnormal findings: Secondary | ICD-10-CM

## 2023-03-12 NOTE — Progress Notes (Signed)
Subjective:   Kara Dean is a 89 y.o. female who presents for Medicare Annual (Subsequent) preventive examination. I connected with  Kara Dean on 03/12/23 by a audio enabled telemedicine application and verified that I am speaking with the correct person using two identifiers.  Patient Location: Home  Provider Location: Home Office  I discussed the limitations of evaluation and management by telemedicine. The patient expressed understanding and agreed to proceed.  Review of Systems     Cardiac Risk Factors include: advanced age (>10men, >49 women);diabetes mellitus;dyslipidemia;hypertension     Objective:    Today's Vitals   03/12/23 1203  Weight: 120 lb (54.4 kg)  Height: 5' (1.524 m)   Body mass index is 23.44 kg/m.     03/12/2023   12:07 PM 02/20/2022   11:38 AM 05/06/2021    8:31 PM 02/19/2021   10:33 AM 02/17/2020   10:11 AM 01/10/2018    1:13 AM 01/09/2018    5:19 PM  Advanced Directives  Does Patient Have a Medical Advance Directive? No No No No No No No  Would patient like information on creating a medical advance directive? No - Patient declined No - Patient declined Yes (ED - Information included in AVS) Yes (MAU/Ambulatory/Procedural Areas - Information given) No - Patient declined Yes (Inpatient - patient requests chaplain consult to create a medical advance directive);Yes (Inpatient - patient defers creating a medical advance directive at this time)     Current Medications (verified) Outpatient Encounter Medications as of 03/12/2023  Medication Sig   albuterol (VENTOLIN HFA) 108 (90 Base) MCG/ACT inhaler Inhale 2 puffs into the lungs every 6 (six) hours as needed for wheezing or shortness of breath.   amLODipine (NORVASC) 10 MG tablet Take 1 tablet (10 mg total) by mouth daily.   atorvastatin (LIPITOR) 10 MG tablet Take 1 tablet (10 mg total) by mouth daily. (NEEDS TO BE SEEN BEFORE NEXT REFILL)   B Complex Vitamins (B COMPLEX-B12) TABS Take 1 tablet by mouth  daily.   Cholecalciferol (VITAMIN D3 PO) Take 1 capsule by mouth daily.   citalopram (CELEXA) 20 MG tablet Take 1 tablet (20 mg total) by mouth daily.   dipyridamole-aspirin (AGGRENOX) 200-25 MG 12hr capsule Take 1 capsule by mouth daily.   fluticasone (FLONASE) 50 MCG/ACT nasal spray Place 2 sprays into both nostrils daily.   furosemide (LASIX) 20 MG tablet Take 1 tablet (20 mg total) by mouth daily.   glucose blood (ONETOUCH ULTRA) test strip CHECK BLOOD SUGER UP TO 3 TIMES A DAY   Insulin Pen Needle (PX EXTRA SHORT PEN NEEDLES) 31G X 6 MM MISC Use as directed   LANTUS SOLOSTAR 100 UNIT/ML Solostar Pen Inject 40 Units into the skin daily.   LORazepam (ATIVAN) 0.5 MG tablet Take 1 tablet (0.5 mg total) by mouth 2 (two) times daily.   Magnesium 250 MG TABS Take 250 mg by mouth daily.   metoprolol succinate (TOPROL-XL) 25 MG 24 hr tablet Take 1 tablet (25 mg total) by mouth daily. Take with or immediately following a meal.   olmesartan (BENICAR) 40 MG tablet Take 1 tablet (40 mg total) by mouth daily.   Omega-3 Fatty Acids (FISH OIL) 1000 MG CAPS Take 1 capsule by mouth daily.   pantoprazole (PROTONIX) 40 MG tablet Take 1 tablet (40 mg total) by mouth daily. (NEEDS TO BE SEEN BEFORE NEXT REFILL)   potassium chloride (KLOR-CON) 10 MEQ tablet Take 1 tablet (10 mEq total) by mouth as directed. [place],  Wed, and Fri only.   tolterodine (DETROL LA) 4 MG 24 hr capsule Take 1 capsule (4 mg total) by mouth daily.   No facility-administered encounter medications on file as of 03/12/2023.    Allergies (verified) Codeine, Latex, Morphine, Niacin, Sulfa antibiotics, and Sulfonamide derivatives   History: Past Medical History:  Diagnosis Date   Anxiety    Carotid artery disease (HCC)    Carotid US 11/2018:  bilateral ICA 1-39 >> repeat 2 years.    Cataract    Crohn's colitis (HCC)    CVA (cerebral infarction)    No deficits   Depression    Diabetes mellitus    type 2   Dyslipidemia    GERD  (gastroesophageal reflux disease)    History of TIAs    no deficits   Hyperlipidemia    Hypertension    Hyponatremia    Nodular basal cell carcinoma (BCC) 04/22/2018   left prox, nasal root (tx p bx)   Osteoarthritis    SCC (squamous cell carcinoma) 04/22/2018   left shin,superior-(tx p bx), Left shin,inferior-(tx p bx )   Past Surgical History:  Procedure Laterality Date   ABDOMINAL HYSTERECTOMY     BACK SURGERY  1994   CATARACT EXTRACTION W/PHACO Left 09/19/2017   Procedure: CATARACT EXTRACTION PHACO AND INTRAOCULAR LENS PLACEMENT (IOC);  Surgeon: Fabio Pierce, MD;  Location: [facility];  Service: Ophthalmology;  Laterality: Left;  CDE: 8.10   CATARACT EXTRACTION W/PHACO Right 10/10/2017   Procedure: CATARACT EXTRACTION PHACO AND INTRAOCULAR LENS PLACEMENT RIGHT EYE;  Surgeon: Fabio Pierce, MD;  Location: [facility];  Service: Ophthalmology;  Laterality: Right;  CDE: 9.60   CHOLECYSTECTOMY N/A 05/10/2021   Procedure: LAPAROSCOPIC CHOLECYSTECTOMY;  Surgeon: Berna Bue, MD;  Location: [facility];  Service: General;  Laterality: N/A;   COLON RESECTION  1990's   TUBAL LIGATION     VESICOVAGINAL FISTULA CLOSURE W/ TAH  1984   Family History  Problem Relation Age of Onset   Coronary artery disease Sister        stent x 3   Seizures Sister    Coronary artery disease Brother        stent   Hypertension Mother    Stroke Mother    Emphysema Father    Alcohol abuse Brother    Lung disease Brother    Heart attack Daughter    Social History   Socioeconomic History   Marital status: Widowed    Spouse name: Not on file   Number of children: 6   Years of education: 10   Highest education level: 10th grade  Occupational History   Occupation: Retired    Comment: [employer]  Tobacco Use   Smoking status: Never    Passive exposure: Current   Smokeless tobacco: Never  Vaping Use   Vaping Use: Never used  Substance and Sexual Activity   Alcohol use: No   Drug use: No   Sexual activity:  Not Currently  Other Topics Concern   Not on file  Social History Narrative   Kara Dean is retired and widowed. She lives at home and her granddaughter lives with her. She lives within walking distance to her 2 daughter's houses. Has an indoor cat. Her sister moved to [place] and she doesn't get to get to visit with her often but does talk with her on the phone. She has 6 children and many grandchildren and great grandchildren.    Social Determinants of Health   Financial Resource Strain:  Low Risk  (03/12/2023)   Overall Financial Resource Strain (CARDIA)    Difficulty of Paying Living Expenses: Not hard at all  Food Insecurity: No Food Insecurity (03/12/2023)   Hunger Vital Sign    Worried About Running Out of Food in the Last Year: Never true    Ran Out of Food in the Last Year: Never true  Transportation Needs: No Transportation Needs (03/12/2023)   PRAPARE - Administrator, Civil Service (Medical): No    Lack of Transportation (Non-Medical): No  Physical Activity: Insufficiently Active (03/12/2023)   Exercise Vital Sign    Days of Exercise per Week: 3 days    Minutes of Exercise per Session: 30 min  Stress: No Stress Concern Present (03/12/2023)   [employer] of Occupational Health - Occupational Stress Questionnaire    Feeling of Stress : Not at all  Social Connections: Socially Isolated (03/12/2023)   Social Connection and Isolation Panel [NHANES]    Frequency of Communication with Friends and Family: More than three times a week    Frequency of Social Gatherings with Friends and Family: More than three times a week    Attends Religious Services: Never    Database administrator or Organizations: No    Attends Banker Meetings: Never    Marital Status: Widowed    Tobacco Counseling Counseling given: Not Answered   Clinical Intake:  Pre-visit preparation completed: Yes  Pain : No/denies pain     Nutritional Risks: None Diabetes: Yes CBG done?:  No Did pt. bring in CBG monitor from home?: No  How often do you need to have someone help you when you read instructions, pamphlets, or other written materials from your doctor or pharmacy?: 1 - Never  Diabetic?yes Nutrition Risk Assessment:  Has the patient had any N/V/D within the last 2 months?  No  Does the patient have any non-healing wounds?  No  Has the patient had any unintentional weight loss or weight gain?  No   Diabetes:  Is the patient diabetic?  Yes  If diabetic, was a CBG obtained today?  No  Did the patient bring in their glucometer from home?  No  How often do you monitor your CBG's? Daily .   Financial Strains and Diabetes Management:  Are you having any financial strains with the device, your supplies or your medication? No .  Does the patient want to be seen by Chronic Care Management for management of their diabetes?  No  Would the patient like to be referred to a Nutritionist or for Diabetic Management?  No   Diabetic Exams:  Diabetic Eye Exam: Completed 06/2022 Diabetic Foot Exam: Overdue, Pt has been advised about the importance in completing this exam. Pt is scheduled for diabetic foot exam on next office visit .    Interpreter Needed?: No  Information entered by :: Renie Ora, LPN   Activities of Daily Living    03/12/2023   12:07 PM  In your present state of health, do you have any difficulty performing the following activities:  Hearing? 0  Vision? 0  Difficulty concentrating or making decisions? 0  Walking or climbing stairs? 0  Dressing or bathing? 0  Doing errands, shopping? 0  Preparing Food and eating ? N  Using the Toilet? N  In the past six months, have you accidently leaked urine? N  Do you have problems with loss of bowel control? N  Managing your Medications? N  Managing your  Finances? N  Housekeeping or managing your Housekeeping? N    Patient Care Team: Bennie Pierini, FNP as PCP - General (Nurse  Practitioner) Tonny Bollman, MD as PCP - Cardiology (Cardiology) Fabio Pierce, MD as Consulting Physician (Ophthalmology) Derryl Harbor, OD (Optometry) Spero Curb Judye Bos, PA-C as Physician Assistant (Dermatology) Isaiah Blakes, LCSW as Social Worker (Licensed Clinical Social Worker) Alben Spittle, Aida Raider as Physician Assistant (Cardiology)  Indicate any recent Medical Services you may have received from other than [facility] providers in the past year (date may be approximate).     Assessment:   This is a routine wellness examination for Kara Dean.  Hearing/Vision screen Vision Screening - Comments:: Wears rx glasses - up to date with routine eye exams with  Dr.Johnson   Dietary issues and exercise activities discussed: Current Exercise Habits: The patient does not participate in regular exercise at present, Exercise limited by: orthopedic condition(s)   Goals Addressed             This Visit's Progress    DIET - INCREASE WATER INTAKE         Depression Screen    03/12/2023   12:06 PM 03/07/2023    2:02 PM 02/17/2023    9:05 AM 12/09/2022    2:18 PM 09/05/2022    2:28 PM 07/29/2022    3:04 PM 03/05/2022    3:38 PM  PHQ 2/9 Scores  PHQ - 2 Score 0 0 6 2 2 2 1   PHQ- 9 Score 0 1 16 8 4 6 8     Fall Risk    03/12/2023   12:04 PM 03/07/2023    2:02 PM 02/17/2023    9:05 AM 12/09/2022    2:18 PM 09/05/2022    2:28 PM  Fall Risk   Falls in the past year? 0 0 0 0 0  Number falls in past yr: 0      Injury with Fall? 0      Risk for fall due to : No Fall Risks      Follow up Falls prevention discussed        FALL RISK PREVENTION PERTAINING TO THE HOME:  Any stairs in or around the home? No  If so, are there any without handrails? No  Home free of loose throw rugs in walkways, pet beds, electrical cords, etc? Yes  Adequate lighting in your home to reduce risk of falls? Yes   ASSISTIVE DEVICES UTILIZED TO PREVENT FALLS:  Life alert? No  Use of a cane, walker or w/c? Yes  Grab  bars in the bathroom? Yes  Shower chair or bench in shower? Yes  Elevated toilet seat or a handicapped toilet? Yes       07/26/2015    4:06 PM  MMSE - Mini Mental State Exam  Orientation to time 5  Orientation to Place 5  Registration 3  Attention/ Calculation 3  Recall 3  Language- name 2 objects 2  Language- repeat 1  Language- follow 3 step command 2  Language- read & follow direction 1  Write a sentence 1  Copy design 1  Total score 27        03/12/2023   12:07 PM 02/20/2022   11:30 AM 02/17/2020   10:15 AM 09/21/2018    3:48 PM  6CIT Screen  What Year? 0 points 0 points 0 points 0 points  What month? 0 points 0 points 0 points 0 points  What time? 0 points 0 points 0 points  0 points  Count back from 20 0 points 0 points 2 points 0 points  Months in reverse 0 points 0 points 0 points 0 points  Repeat phrase 0 points 0 points 2 points 0 points  Total Score 0 points 0 points 4 points 0 points    Immunizations Immunization History  Administered Date(s) Administered   Fluad Quad(high Dose 65+) 07/31/2021, 10/21/2022   Influenza Split 07/23/2013, 07/24/2015   Influenza,inj,Quad PF,6+ Mos 08/01/2017, 08/15/2018   Influenza,inj,quad, With Preservative 08/03/2019   Influenza-Unspecified 1933-06-13, [birth year]/14/1937, 07/18/2014, 08/05/2016, 08/17/2018, 09/17/2018, 10/05/2019, 08/23/2020   Moderna Sars-Covid-2 Vaccination 03/20/2020, 04/17/2020   Pneumococcal Conjugate-13 07/18/2014   Pneumococcal Polysaccharide-23 08/04/2008, 08/17/2018   Pneumococcal-Unspecified 08/17/2018   Td 06/11/2005   Tdap 08/15/2014   Zoster Recombinat (Shingrix) 10/05/2019, 05/01/2021   Zoster, Live 08/07/2012    TDAP status: Up to date  Flu Vaccine status: Up to date  Pneumococcal vaccine status: Up to date  Covid-19 vaccine status: Completed vaccines  Qualifies for Shingles Vaccine? Yes   Zostavax completed Yes   Shingrix Completed?: Yes  Screening Tests Health Maintenance  Topic Date  Due   COVID-19 Vaccine (3 - 2023-24 season) 03/23/2023 (Originally 07/05/2022)   INFLUENZA VACCINE  06/05/2023   HEMOGLOBIN A1C  09/07/2023   OPHTHALMOLOGY EXAM  03/04/2024   FOOT EXAM  03/06/2024   Medicare Annual Wellness (AWV)  03/11/2024   DTaP/Tdap/Td (3 - Td or Tdap) 08/15/2024   Pneumonia Vaccine 29+ Years old  Completed   DEXA SCAN  Completed   Zoster Vaccines- Shingrix  Completed   HPV VACCINES  Aged Out    Health Maintenance  There are no preventive care reminders to display for this patient.   Colorectal cancer screening: No longer required.   Mammogram status: No longer required due to age .  Bone Density status: Ordered declined . Pt provided with contact info and advised to call to schedule appt.  Lung Cancer Screening: (Low Dose CT Chest recommended if Age 65-80 years, 30 pack-year currently smoking OR have quit w/in 15years.) does not qualify.   Lung Cancer Screening Referral: n/a  Additional Screening:  Hepatitis C Screening: does not qualify;   Vision Screening: Recommended annual ophthalmology exams for early detection of glaucoma and other disorders of the eye. Is the patient up to date with their annual eye exam?  Yes  Who is the provider or what is the name of the office in which the patient attends annual eye exams? Dr.Johnson  If pt is not established with a provider, would they like to be referred to a provider to establish care? No .   Dental Screening: Recommended annual dental exams for proper oral hygiene  Community Resource Referral / Chronic Care Management: CRR required this visit?  No   CCM required this visit?  No      Plan:     I have personally reviewed and noted the following in the patient's chart:   Medical and social history Use of alcohol, tobacco or illicit drugs  Current medications and supplements including opioid prescriptions. Patient is not currently taking opioid prescriptions. Functional ability and  status Nutritional status Physical activity Advanced directives List of other physicians Hospitalizations, surgeries, and ER visits in previous 12 months Vitals Screenings to include cognitive, depression, and falls Referrals and appointments  In addition, I have reviewed and discussed with patient certain preventive protocols, quality metrics, and best practice recommendations. A written personalized care plan for preventive services as well as general preventive health  recommendations were provided to patient.     Lorrene Reid, LPN   11/09/1094   Nurse Notes: none

## 2023-03-12 NOTE — Patient Instructions (Signed)
Kara Dean , Thank you for taking time to come for your Medicare Wellness Visit. I appreciate your ongoing commitment to your health goals. Please review the following plan we discussed and let me know if I can assist you in the future.   These are the goals we discussed:  Goals       Client will talk with LCSW in next 30 days to talk about anxiety or depression issues of client (pt-stated)      CARE PLAN ENTRY   Current Barriers:  Mobility issues Anxiety issues of client with chronic diagnoses of HLD, Depression HTN, Hx CVA, OA, Type 2 DM, GERD, GAD, CKD  Clinical Social Work Clinical Goal(s):  LCSW to call client/daugher in next 30 days to tlak about anxiety or depression issues of clinet  Interventions: LCSW talked with client about relaxation techniques of client (likes to visit with children, likes to ride in car, watches TV, likes to listen to religous service,likes to talk via phone with relatives) Talked with client about CCM program  Talked with client about decreased appetite Talked with client about sleeping challenges Talked with client about pain issues of client Talked with client about mobility challenges  Talked with client about anxiety issues of client (family stress issues) Talked about social support network (daughter is supportive) Talked with client and her daughter about client food stamps benefit Talked with client about medication procurement Talked with clinet about ADLs completion of client  Patient Self Care Activities:  Completes ADLs Takes medications as prescribed Attends medical appointment  Patient Self Care Deficits:  Mobility issues Anxiety issues  Initial goal documentation       DIET - INCREASE WATER INTAKE      Exercise 150 min/wk Moderate Activity      Chair exercises or walking daily for 30 minutes      Manage Anxiety and Depression issues faced. Manage My Emotions (Patient)      Timeframe:  Short-Term Goal Priority:   Medium Progress: On Track Start Date:           01/31/22                  Expected End Date:   04/30/22    Follow Up Date  03/25/22 at 2:00 PM   Manage My Emotions (Patient)  Manage Anxiety and Depression issues faced    Why is this important?   When you are stressed, down or upset, your body reacts too.  For example, your blood pressure may get higher; you may have a headache or stomachache.  When your emotions get the best of you, your body's ability to fight off cold and flu gets weak.  These steps will help you manage your emotions.    Patient Self Care Activities:  Takes medications as prescribed Tries to complete ADLs as able  Patient Coping Strengths:  Has family support Attends scheduled medical appointments Has no transport needs  Patient Self Care Deficits:  Decreased appetite Some mobility issues  Patient Goals:  - spend time or talk with others every day - practice relaxation or meditation daily - keep a calendar with appointment dates  Follow Up Plan:  LCSW to call client or her daughter, Kara Dean, on 03/25/22  at 2:00 PM to assess client needs        Patient Stated      02/17/2020 AWV Goal: Exercise for General Health  Patient will verbalize understanding of the benefits of increased physical activity: Exercising regularly is important.  It will improve your overall fitness, flexibility, and endurance. Regular exercise also will improve your overall health. It can help you control your weight, reduce stress, and improve your bone density. Over the next year, patient will increase physical activity as tolerated with a goal of at least 150 minutes of moderate physical activity per week.  You can tell that you are exercising at a moderate intensity if your heart starts beating faster and you start breathing faster but can still hold a conversation. Moderate-intensity exercise ideas include: Walking 1 mile (1.6 km) in about 15  minutes Biking Hiking Golfing Dancing Water aerobics Patient will verbalize understanding of everyday activities that increase physical activity by providing examples like the following: Yard work, such as: Insurance underwriter Gardening Washing windows or floors Patient will be able to explain general safety guidelines for exercising:  Before you start a new exercise program, talk with your health care provider. Do not exercise so much that you hurt yourself, feel dizzy, or get very short of breath. Wear comfortable clothes and wear shoes with good support. Drink plenty of water while you exercise to prevent dehydration or heat stroke. Work out until your breathing and your heartbeat get faster.       Prevent falls      Use a light when going to the bathroom at night       Protect My Health        This is a list of the screening recommended for you and due dates:  Health Maintenance  Topic Date Due   COVID-19 Vaccine (3 - 2023-24 season) 03/23/2023*   Flu Shot  06/05/2023   Hemoglobin A1C  09/07/2023   Eye exam for diabetics  03/04/2024   Complete foot exam   03/06/2024   Medicare Annual Wellness Visit  03/11/2024   DTaP/Tdap/Td vaccine (3 - Td or Tdap) 08/15/2024   Pneumonia Vaccine  Completed   DEXA scan (bone density measurement)  Completed   Zoster (Shingles) Vaccine  Completed   HPV Vaccine  Aged Out  *Topic was postponed. The date shown is not the original due date.    Advanced directives: Advance directive discussed with you today. I have provided a copy for you to complete at home and have notarized. Once this is complete please bring a copy in to our office so we can scan it into your chart.   Conditions/risks identified: Aim for 30 minutes of exercise or brisk walking, 6-8 glasses of water, and 5 servings of fruits and vegetables each day.   Next appointment: Follow up in one  year for your annual wellness visit    Preventive Care 65 Years and Older, Female Preventive care refers to lifestyle choices and visits with your health care provider that can promote health and wellness. What does preventive care include? A yearly physical exam. This is also called an annual well check. Dental exams once or twice a year. Routine eye exams. Ask your health care provider how often you should have your eyes checked. Personal lifestyle choices, including: Daily care of your teeth and gums. Regular physical activity. Eating a healthy diet. Avoiding tobacco and drug use. Limiting alcohol use. Practicing safe sex. Taking low-dose aspirin every day. Taking vitamin and mineral supplements as recommended by your health care provider. What happens during an annual well check? The services and screenings done by your health care provider during your annual well check  will depend on your age, overall health, lifestyle risk factors, and family history of disease. Counseling  Your health care provider may ask you questions about your: Alcohol use. Tobacco use. Drug use. Emotional well-being. Home and relationship well-being. Sexual activity. Eating habits. History of falls. Memory and ability to understand (cognition). Work and work Astronomer. Reproductive health. Screening  You may have the following tests or measurements: Height, weight, and BMI. Blood pressure. Lipid and cholesterol levels. These may be checked every 5 years, or more frequently if you are over 58 years old. Skin check. Lung cancer screening. You may have this screening every year starting at age 15 if you have a 30-pack-year history of smoking and currently smoke or have quit within the past 15 years. Fecal occult blood test (FOBT) of the stool. You may have this test every year starting at age 28. Flexible sigmoidoscopy or colonoscopy. You may have a sigmoidoscopy every 5 years or a colonoscopy  every 10 years starting at age 12. Hepatitis C blood test. Hepatitis B blood test. Sexually transmitted disease (STD) testing. Diabetes screening. This is done by checking your blood sugar (glucose) after you have not eaten for a while (fasting). You may have this done every 1-3 years. Bone density scan. This is done to screen for osteoporosis. You may have this done starting at age 57. Mammogram. This may be done every 1-2 years. Talk to your health care provider about how often you should have regular mammograms. Talk with your health care provider about your test results, treatment options, and if necessary, the need for more tests. Vaccines  Your health care provider may recommend certain vaccines, such as: Influenza vaccine. This is recommended every year. Tetanus, diphtheria, and acellular pertussis (Tdap, Td) vaccine. You may need a Td booster every 10 years. Zoster vaccine. You may need this after age 50. Pneumococcal 13-valent conjugate (PCV13) vaccine. One dose is recommended after age 53. Pneumococcal polysaccharide (PPSV23) vaccine. One dose is recommended after age 60. Talk to your health care provider about which screenings and vaccines you need and how often you need them. This information is not intended to replace advice given to you by your health care provider. Make sure you discuss any questions you have with your health care provider. Document Released: 11/17/2015 Document Revised: 07/10/2016 Document Reviewed: 08/22/2015 Elsevier Interactive Patient Education  2017 [employer].  Fall Prevention in the Home Falls can cause injuries. They can happen to people of all ages. There are many things you can do to make your home safe and to help prevent falls. What can I do on the outside of my home? Regularly fix the edges of walkways and driveways and fix any cracks. Remove anything that might make you trip as you walk through a door, such as a raised step or threshold. Trim  any bushes or trees on the path to your home. Use bright outdoor lighting. Clear any walking paths of anything that might make someone trip, such as rocks or tools. Regularly check to see if handrails are loose or broken. Make sure that both sides of any steps have handrails. Any raised decks and porches should have guardrails on the edges. Have any leaves, snow, or ice cleared regularly. Use sand or salt on walking paths during winter. Clean up any spills in your garage right away. This includes oil or grease spills. What can I do in the bathroom? Use night lights. Install grab bars by the toilet and in the tub and  shower. Do not use towel bars as grab bars. Use non-skid mats or decals in the tub or shower. If you need to sit down in the shower, use a plastic, non-slip stool. Keep the floor dry. Clean up any water that spills on the floor as soon as it happens. Remove soap buildup in the tub or shower regularly. Attach bath mats securely with double-sided non-slip rug tape. Do not have throw rugs and other things on the floor that can make you trip. What can I do in the bedroom? Use night lights. Make sure that you have a light by your bed that is easy to reach. Do not use any sheets or blankets that are too big for your bed. They should not hang down onto the floor. Have a firm chair that has side arms. You can use this for support while you get dressed. Do not have throw rugs and other things on the floor that can make you trip. What can I do in the kitchen? Clean up any spills right away. Avoid walking on wet floors. Keep items that you use a lot in easy-to-reach places. If you need to reach something above you, use a strong step stool that has a grab bar. Keep electrical cords out of the way. Do not use floor polish or wax that makes floors slippery. If you must use wax, use non-skid floor wax. Do not have throw rugs and other things on the floor that can make you trip. What can I  do with my stairs? Do not leave any items on the stairs. Make sure that there are handrails on both sides of the stairs and use them. Fix handrails that are broken or loose. Make sure that handrails are as long as the stairways. Check any carpeting to make sure that it is firmly attached to the stairs. Fix any carpet that is loose or worn. Avoid having throw rugs at the top or bottom of the stairs. If you do have throw rugs, attach them to the floor with carpet tape. Make sure that you have a light switch at the top of the stairs and the bottom of the stairs. If you do not have them, ask someone to add them for you. What else can I do to help prevent falls? Wear shoes that: Do not have high heels. Have rubber bottoms. Are comfortable and fit you well. Are closed at the toe. Do not wear sandals. If you use a stepladder: Make sure that it is fully opened. Do not climb a closed stepladder. Make sure that both sides of the stepladder are locked into place. Ask someone to hold it for you, if possible. Clearly mark and make sure that you can see: Any grab bars or handrails. First and last steps. Where the edge of each step is. Use tools that help you move around (mobility aids) if they are needed. These include: Canes. Walkers. Scooters. Crutches. Turn on the lights when you go into a dark area. Replace any light bulbs as soon as they burn out. Set up your furniture so you have a clear path. Avoid moving your furniture around. If any of your floors are uneven, fix them. If there are any pets around you, be aware of where they are. Review your medicines with your doctor. Some medicines can make you feel dizzy. This can increase your chance of falling. Ask your doctor what other things that you can do to help prevent falls. This information is not intended to  replace advice given to you by your health care provider. Make sure you discuss any questions you have with your health care  provider. Document Released: 08/17/2009 Document Revised: 03/28/2016 Document Reviewed: 11/25/2014 Elsevier Interactive Patient Education  2017 [employer].

## 2023-03-20 ENCOUNTER — Other Ambulatory Visit: Payer: Self-pay | Admitting: Nurse Practitioner

## 2023-04-28 IMAGING — CT CT ABD-PELV W/ CM
2 of 5 series · 16 of 46 positions shown, 18 images · IV contrast (APPLIED)
Comparison: None.

CLINICAL DATA: Epigastric pain, nausea, and vomiting. History of
Crohn's colitis.

EXAM:
CT ABDOMEN AND PELVIS WITH CONTRAST
TECHNIQUE: Multidetector CT imaging of the abdomen and pelvis was performed
using the standard protocol following bolus administration of
intravenous contrast.
CONTRAST:  80mL OMNIPAQUE IOHEXOL 300 MG/ML  SOLN

[Series 3: abdomen 5.0 · axial · 0.83mm/px · z∈[-321,+44]mm · 13 of 85 slices shown, 15 images]
[im 6/85  soft-tissue]
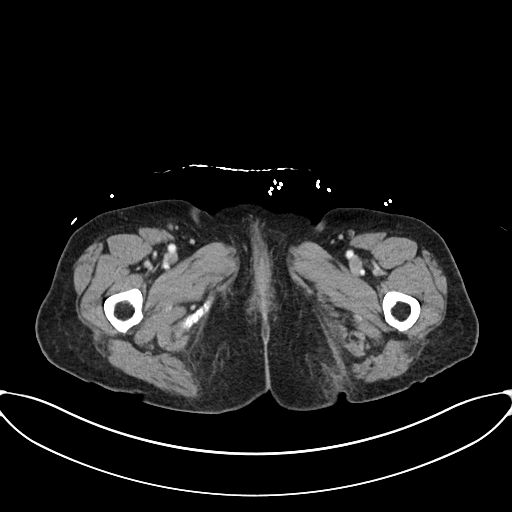
[im 6/85  bone]
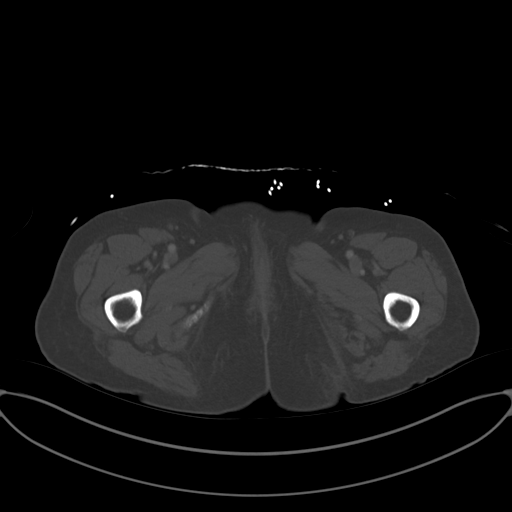
[im 11/85  soft-tissue]
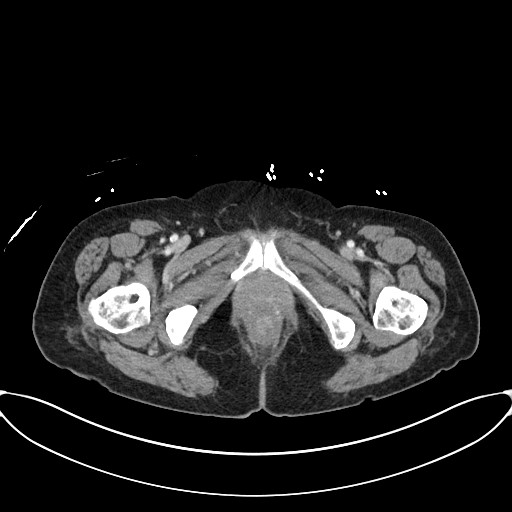
[im 16/85  soft-tissue]
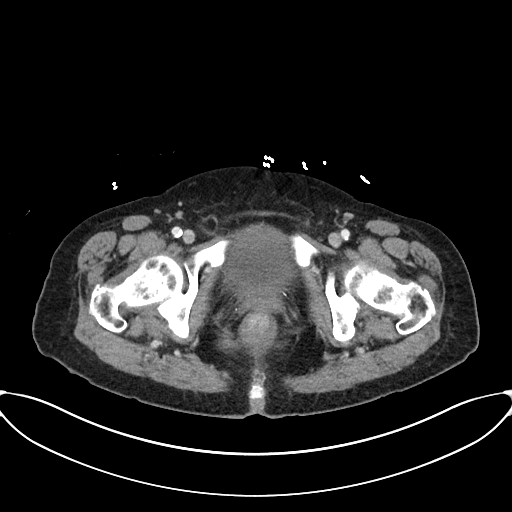
[im 27/85  soft-tissue]
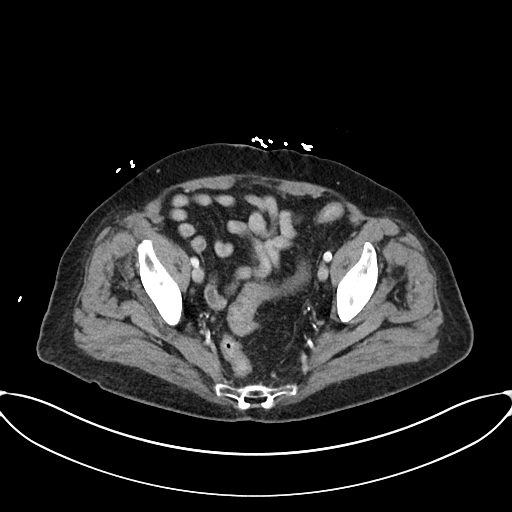
[im 32/85  soft-tissue]
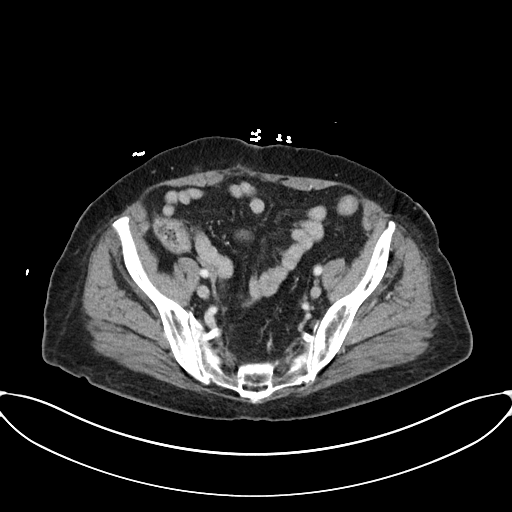
[im 37/85  soft-tissue]
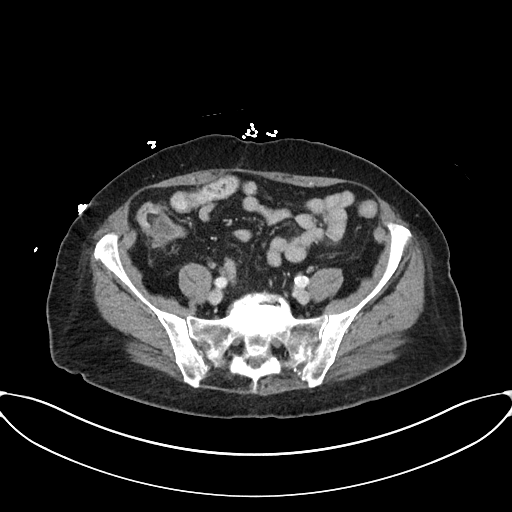
[im 43/85  soft-tissue]
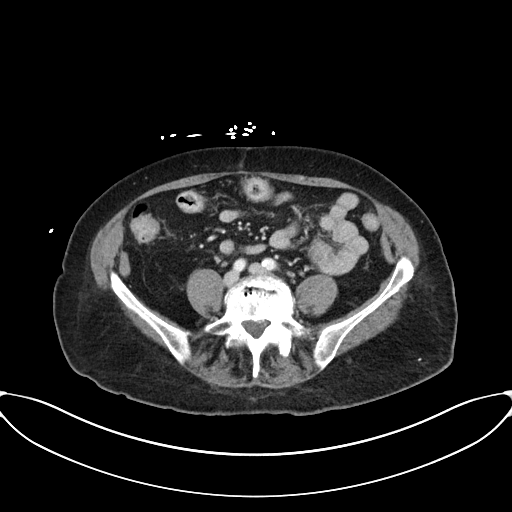
[im 48/85  soft-tissue]
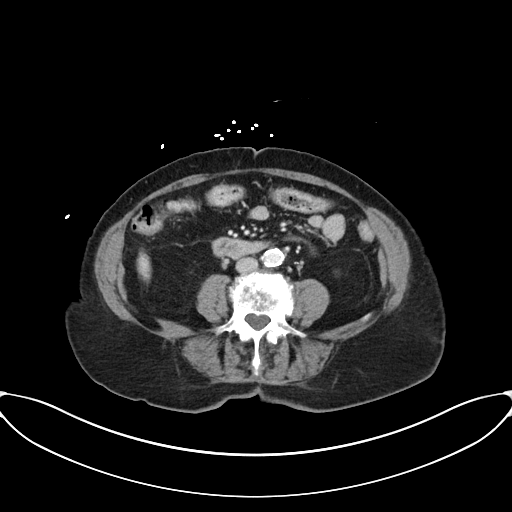
[im 53/85  soft-tissue]
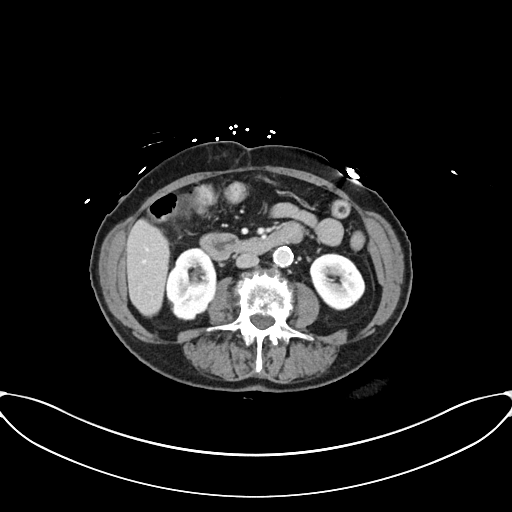
[im 53/85  bone]
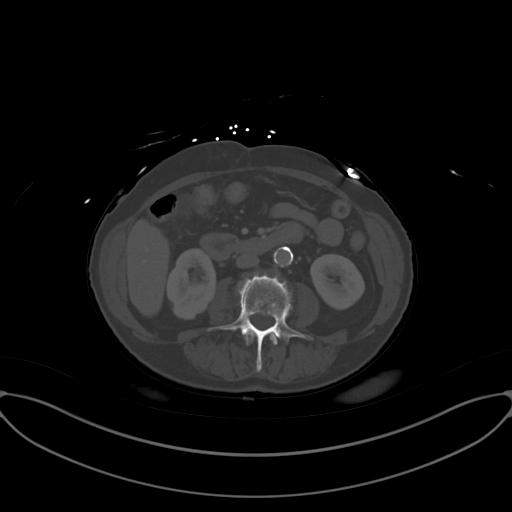
[im 58/85  soft-tissue]
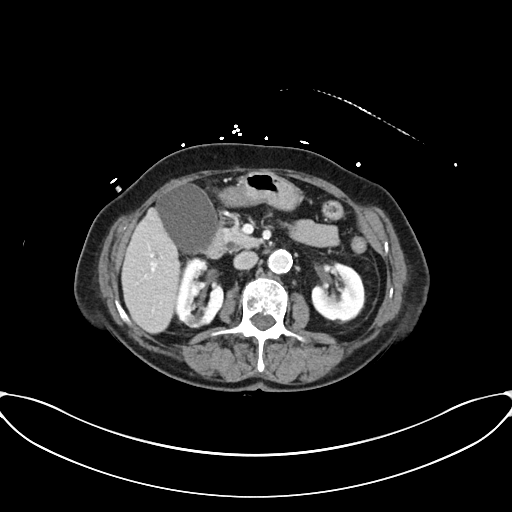
[im 69/85  soft-tissue]
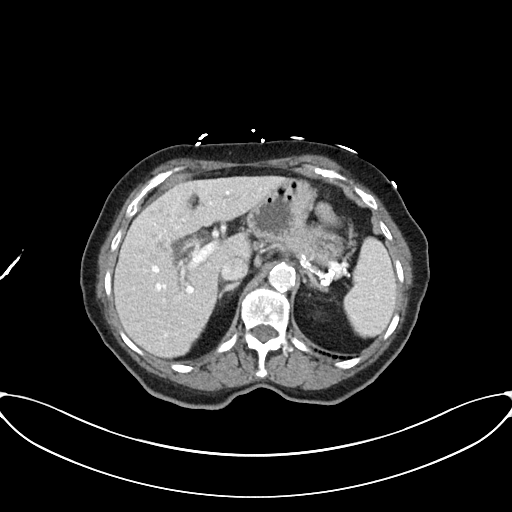
[im 74/85  soft-tissue]
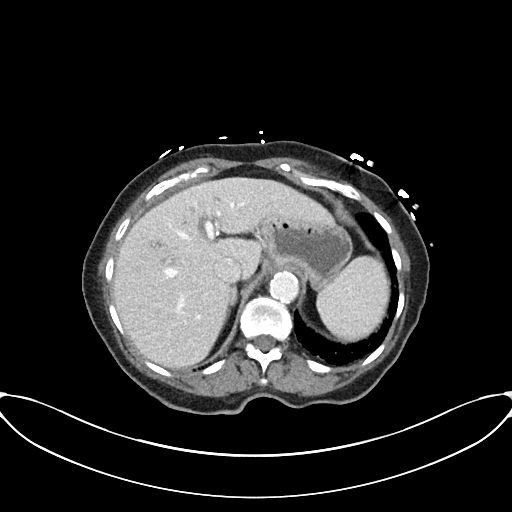
[im 79/85  soft-tissue]
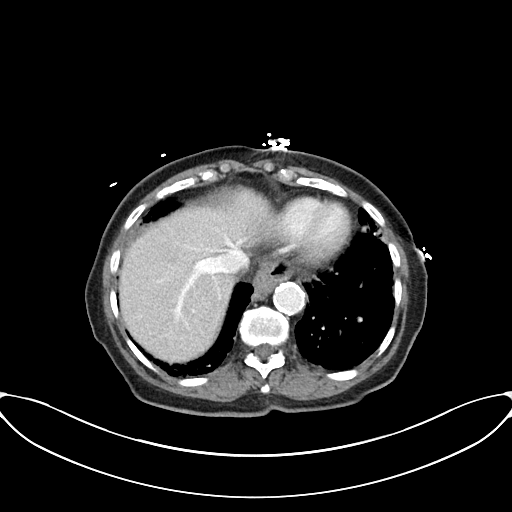

[Series 6: abdomen 3.0 mpr cor · coronal · 0.66mm/px · 3 of 73 slices shown]
[im 25/73  soft-tissue]
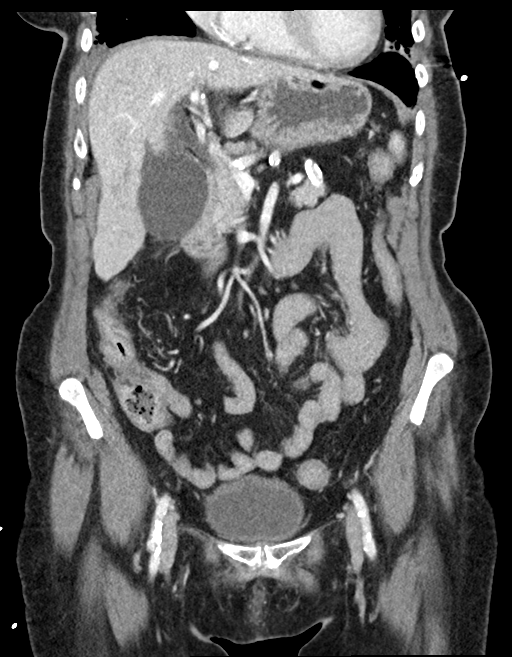
[im 33/73  soft-tissue]
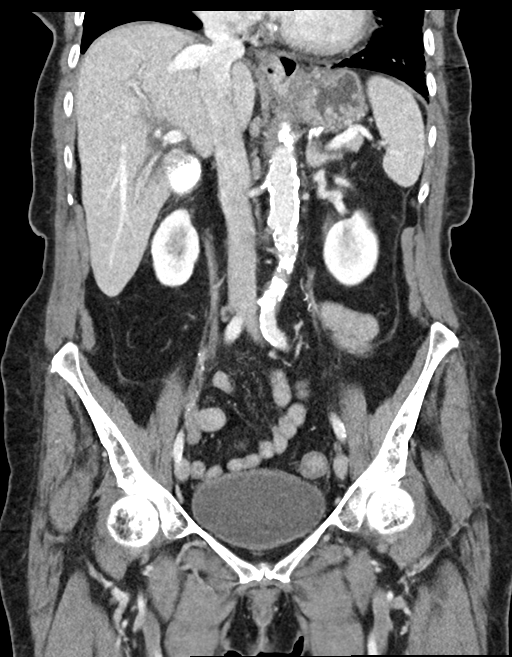
[im 41/73  soft-tissue]
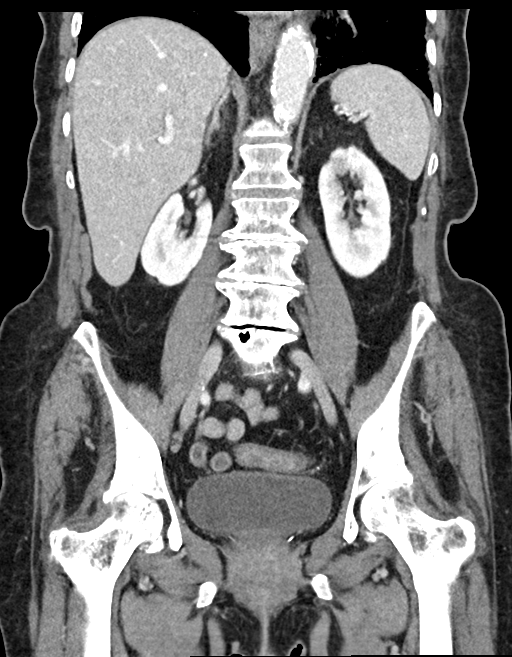

[16 of 46 positions shown; findings below may reference images not displayed]

FINDINGS: Lower chest: Coarse interstitial scarring in the lung bases.
Coronary artery calcifications. Small esophageal hiatal hernia.

Hepatobiliary: Cholelithiasis with distended gallbladder. Moderate
intrahepatic and extrahepatic bile duct dilatation. A tiny
calcification is suggested in the distal common bile duct suggesting
a common duct stone.

Pancreas: Unremarkable. No pancreatic ductal dilatation or
surrounding inflammatory changes.

Spleen: Normal in size without focal abnormality.

Adrenals/Urinary Tract: No adrenal gland nodules. Small parenchymal
cysts in both kidneys. Scarring in the upper pole of the right
kidney. Nephrograms are otherwise symmetrical and homogeneous. No
hydronephrosis or hydroureter. Bladder is unremarkable.

Stomach/Bowel: Stomach, small bowel, and colon are not abnormally
distended. Mild wall thickening in the rectosigmoid and descending
colon likely related to the patient's history of colitis. No
pericolonic inflammatory changes or abscess is identified. Appendix
is not visualized.

Vascular/Lymphatic: Diffuse calcification of the abdominal aorta and
celiac/mesenteric branch vessels. No aneurysm.

Reproductive: Prostate is unremarkable.

Other: No free air or free fluid in the abdomen. Abdominal wall
musculature appears intact.

Musculoskeletal: Degenerative changes in the lumbar spine. No
destructive bone lesions.
IMPRESSION: 1. Cholelithiasis and distended gallbladder. Bile duct dilatation
with probable common bile duct stone.
2. Coarse interstitial fibrosis in the lung bases.
3. Small esophageal hiatal hernia.
4. Aortic atherosclerosis.
5. Mild diffuse wall thickening of the left colon consistent with
patient's history of colitis.

## 2023-05-22 DIAGNOSIS — E1142 Type 2 diabetes mellitus with diabetic polyneuropathy: Secondary | ICD-10-CM | POA: Diagnosis not present

## 2023-05-22 DIAGNOSIS — L603 Nail dystrophy: Secondary | ICD-10-CM | POA: Diagnosis not present

## 2023-05-22 DIAGNOSIS — L84 Corns and callosities: Secondary | ICD-10-CM | POA: Diagnosis not present

## 2023-05-22 DIAGNOSIS — M79676 Pain in unspecified toe(s): Secondary | ICD-10-CM | POA: Diagnosis not present

## 2023-05-28 DIAGNOSIS — L820 Inflamed seborrheic keratosis: Secondary | ICD-10-CM | POA: Diagnosis not present

## 2023-05-28 DIAGNOSIS — C44629 Squamous cell carcinoma of skin of left upper limb, including shoulder: Secondary | ICD-10-CM | POA: Diagnosis not present

## 2023-05-28 DIAGNOSIS — L570 Actinic keratosis: Secondary | ICD-10-CM | POA: Diagnosis not present

## 2023-06-24 ENCOUNTER — Ambulatory Visit (INDEPENDENT_AMBULATORY_CARE_PROVIDER_SITE_OTHER): Payer: 59 | Admitting: Family Medicine

## 2023-06-24 ENCOUNTER — Encounter: Payer: Self-pay | Admitting: Family Medicine

## 2023-06-24 VITALS — BP 142/62 | HR 68 | Temp 95.5°F | Ht 61.0 in | Wt 118.0 lb

## 2023-06-24 DIAGNOSIS — L03116 Cellulitis of left lower limb: Secondary | ICD-10-CM | POA: Diagnosis not present

## 2023-06-24 MED ORDER — DOXYCYCLINE HYCLATE 100 MG PO TABS
100.0000 mg | ORAL_TABLET | Freq: Two times a day (BID) | ORAL | 0 refills | Status: DC
Start: 2023-06-24 — End: 2023-06-30

## 2023-06-24 NOTE — Progress Notes (Signed)
Subjective:  Patient ID: Kara Dean, female    DOB: 1933-10-06, 89 y.o.   MRN: [id number]  Patient Care Team: Bennie Pierini, FNP as PCP - General (Nurse Practitioner) Tonny Bollman, MD as PCP - Cardiology (Cardiology) Fabio Pierce, MD as Consulting Physician (Ophthalmology) Derryl Harbor, OD (Optometry) Spero Curb Garth Bigness as Physician Assistant (Dermatology) Isaiah Blakes, LCSW as Social Worker (Licensed Clinical Social Worker) Alben Spittle, Aida Raider as Physician Assistant (Cardiology)   Chief Complaint:  Insect Bite (Right lower leg - first noticed a day ago. Patient also had a scab on left lower leg x 1 week. She pulled off a scab and now looks infected. )   HPI: Kara Dean is a 89 y.o. female presenting on 06/24/2023 for Insect Bite (Right lower leg - first noticed a day ago. Patient also had a scab on left lower leg x 1 week. She pulled off a scab and now looks infected. )   Pt presents today for evaluation of redness and swelling to LLE. She was seen by dermatology and had a SCC frozen on her left shin. States yesterday she picked at the scab and now has swelling, erythema, and purulent drainage. No fever, chills, weakness, or confusion. She also states she had a bug bite to her right ankle that was red and swollen, states subsided now.     Relevant past medical, surgical, family, and social history reviewed and updated as indicated.  Allergies and medications reviewed and updated. Data reviewed: Chart in Epic.   Past Medical History:  Diagnosis Date   Anxiety    Carotid artery disease (HCC)    Carotid US 11/2018:  bilateral ICA 1-39 >> repeat 2 years.    Cataract    Crohn's colitis (HCC)    CVA (cerebral infarction)    No deficits   Depression    Diabetes mellitus    type 2   Dyslipidemia    GERD (gastroesophageal reflux disease)    History of TIAs    no deficits   Hyperlipidemia    Hypertension    Hyponatremia    Nodular basal  cell carcinoma (BCC) 04/22/2018   left prox, nasal root (tx p bx)   Osteoarthritis    SCC (squamous cell carcinoma) 04/22/2018   left shin,superior-(tx p bx), Left shin,inferior-(tx p bx )    Past Surgical History:  Procedure Laterality Date   ABDOMINAL HYSTERECTOMY     BACK SURGERY  1994   CATARACT EXTRACTION W/PHACO Left 09/19/2017   Procedure: CATARACT EXTRACTION PHACO AND INTRAOCULAR LENS PLACEMENT (IOC);  Surgeon: Fabio Pierce, MD;  Location: [facility];  Service: Ophthalmology;  Laterality: Left;  CDE: 8.10   CATARACT EXTRACTION W/PHACO Right 10/10/2017   Procedure: CATARACT EXTRACTION PHACO AND INTRAOCULAR LENS PLACEMENT RIGHT EYE;  Surgeon: Fabio Pierce, MD;  Location: [facility];  Service: Ophthalmology;  Laterality: Right;  CDE: 9.60   CHOLECYSTECTOMY N/A 05/10/2021   Procedure: LAPAROSCOPIC CHOLECYSTECTOMY;  Surgeon: Berna Bue, MD;  Location: [facility];  Service: General;  Laterality: N/A;   COLON RESECTION  1990's   TUBAL LIGATION     VESICOVAGINAL FISTULA CLOSURE W/ TAH  1984    Social History   Socioeconomic History   Marital status: Widowed    Spouse name: Not on file   Number of children: 6   Years of education: 10   Highest education level: 10th grade  Occupational History   Occupation: Retired    Comment: Oceanographer  Tobacco Use   Smoking status: Never    Passive exposure: Current   Smokeless tobacco: Never  Vaping Use   Vaping status: Never Used  Substance and Sexual Activity   Alcohol use: No   Drug use: No   Sexual activity: Not Currently  Other Topics Concern   Not on file  Social History Narrative   Kara Dean is retired and widowed. She lives at home and her granddaughter lives with her. She lives within walking distance to her 2 daughter's houses. Has an indoor cat. Her sister moved to [place] and she doesn't get to get to visit with her often but does talk with her on the phone. She has 6 children and many grandchildren and great grandchildren.     Social Determinants of Health   Financial Resource Strain: Low Risk  (03/12/2023)   Overall Financial Resource Strain (CARDIA)    Difficulty of Paying Living Expenses: Not hard at all  Food Insecurity: No Food Insecurity (03/12/2023)   Hunger Vital Sign    Worried About Running Out of Food in the Last Year: Never true    Ran Out of Food in the Last Year: Never true  Transportation Needs: No Transportation Needs (03/12/2023)   PRAPARE - Administrator, Civil Service (Medical): No    Lack of Transportation (Non-Medical): No  Physical Activity: Insufficiently Active (03/12/2023)   Exercise Vital Sign    Days of Exercise per Week: 3 days    Minutes of Exercise per Session: 30 min  Stress: No Stress Concern Present (03/12/2023)   [employer] of Occupational Health - Occupational Stress Questionnaire    Feeling of Stress : Not at all  Social Connections: Socially Isolated (03/12/2023)   Social Connection and Isolation Panel [NHANES]    Frequency of Communication with Friends and Family: More than three times a week    Frequency of Social Gatherings with Friends and Family: More than three times a week    Attends Religious Services: Never    Database administrator or Organizations: No    Attends Banker Meetings: Never    Marital Status: Widowed  Intimate Partner Violence: Not At Risk (03/12/2023)   Humiliation, Afraid, Rape, and Kick questionnaire    Fear of Current or Ex-Partner: No    Emotionally Abused: No    Physically Abused: No    Sexually Abused: No    Outpatient Encounter Medications as of 06/24/2023  Medication Sig   albuterol (VENTOLIN HFA) 108 (90 Base) MCG/ACT inhaler Inhale 2 puffs into the lungs every 6 (six) hours as needed for wheezing or shortness of breath.   amLODipine (NORVASC) 10 MG tablet Take 1 tablet (10 mg total) by mouth daily.   atorvastatin (LIPITOR) 10 MG tablet Take 1 tablet (10 mg total) by mouth daily. (NEEDS TO BE SEEN BEFORE NEXT  REFILL)   B Complex Vitamins (B COMPLEX-B12) TABS Take 1 tablet by mouth daily.   Cholecalciferol (VITAMIN D3 PO) Take 1 capsule by mouth daily.   citalopram (CELEXA) 20 MG tablet Take 1 tablet (20 mg total) by mouth daily.   dipyridamole-aspirin (AGGRENOX) 200-25 MG 12hr capsule Take 1 capsule by mouth daily.   doxycycline (VIBRA-TABS) 100 MG tablet Take 1 tablet (100 mg total) by mouth 2 (two) times daily for 10 days. 1 po bid   fluticasone (FLONASE) 50 MCG/ACT nasal spray USE 2 SPRAYS IN EACH NOSTRIL ONCE DAILY   furosemide (LASIX) 20 MG tablet Take 1 tablet (20  mg total) by mouth daily.   glucose blood (ONETOUCH ULTRA) test strip CHECK BLOOD SUGER UP TO 3 TIMES A DAY   Insulin Pen Needle (PX EXTRA SHORT PEN NEEDLES) 31G X 6 MM MISC Use as directed   LANTUS SOLOSTAR 100 UNIT/ML Solostar Pen Inject 40 Units into the skin daily.   LORazepam (ATIVAN) 0.5 MG tablet Take 1 tablet (0.5 mg total) by mouth 2 (two) times daily.   Magnesium 250 MG TABS Take 250 mg by mouth daily.   metoprolol succinate (TOPROL-XL) 25 MG 24 hr tablet Take 1 tablet (25 mg total) by mouth daily. Take with or immediately following a meal.   olmesartan (BENICAR) 40 MG tablet Take 1 tablet (40 mg total) by mouth daily.   Omega-3 Fatty Acids (FISH OIL) 1000 MG CAPS Take 1 capsule by mouth daily.   pantoprazole (PROTONIX) 40 MG tablet Take 1 tablet (40 mg total) by mouth daily. (NEEDS TO BE SEEN BEFORE NEXT REFILL)   potassium chloride (KLOR-CON) 10 MEQ tablet Take 1 tablet (10 mEq total) by mouth as directed. Mon, Wed, and Fri only.   tolterodine (DETROL LA) 4 MG 24 hr capsule Take 1 capsule (4 mg total) by mouth daily.   No facility-administered encounter medications on file as of 06/24/2023.    Allergies  Allergen Reactions   Codeine Other (See Comments)    unknown   Latex Rash   Morphine Other (See Comments)    unknown   Niacin Other (See Comments)    unknown   Sulfa Antibiotics Rash   Sulfonamide Derivatives  Rash    Review of Systems  Constitutional:  Negative for activity change, appetite change, chills, diaphoresis, fatigue, fever and unexpected weight change.  HENT: Negative.    Eyes: Negative.  Negative for photophobia and visual disturbance.  Respiratory:  Negative for cough, chest tightness and shortness of breath.   Cardiovascular:  Negative for chest pain, palpitations and leg swelling.  Gastrointestinal:  Negative for abdominal pain, blood in stool, constipation, diarrhea, nausea and vomiting.  Endocrine: Negative.   Genitourinary:  Negative for decreased urine volume, difficulty urinating, dysuria, frequency and urgency.  Musculoskeletal:  Negative for arthralgias and myalgias.  Skin:  Positive for color change and wound. Negative for pallor and rash.  Allergic/Immunologic: Negative.   Neurological:  Negative for dizziness, weakness and headaches.  Hematological: Negative.   Psychiatric/Behavioral:  Negative for confusion, hallucinations, sleep disturbance and suicidal ideas.   All other systems reviewed and are negative.       Objective:  BP (!) 142/62   Pulse 68   Temp (!) 95.5 F (35.3 C) (Temporal)   Ht 5\' 1"  (1.549 m)   Wt 118 lb (53.5 kg)   SpO2 93%   BMI 22.30 kg/m    Wt Readings from Last 3 Encounters:  06/24/23 118 lb (53.5 kg)  03/12/23 120 lb (54.4 kg)  03/07/23 122 lb (55.3 kg)    Physical Exam Vitals and nursing note reviewed.  Constitutional:      General: She is not in acute distress.    Appearance: Normal appearance. She is not ill-appearing, toxic-appearing or diaphoretic.  HENT:     Head: Normocephalic and atraumatic.     Mouth/Throat:     Mouth: Mucous membranes are moist.  Eyes:     Pupils: Pupils are equal, round, and reactive to light.  Cardiovascular:     Rate and Rhythm: Normal rate and regular rhythm.     Heart sounds: Normal heart sounds.  Pulmonary:     Effort: Pulmonary effort is normal.     Breath sounds: Normal breath sounds.   Musculoskeletal:     Right lower leg: Edema present.     Left lower leg: Edema present.  Skin:    General: Skin is warm and dry.     Capillary Refill: Capillary refill takes less than 2 seconds.     Findings: Erythema and lesion present.       Neurological:     General: No focal deficit present.     Mental Status: She is alert and oriented to person, place, and time.  Psychiatric:        Mood and Affect: Mood normal.        Behavior: Behavior normal.        Thought Content: Thought content normal.        Judgment: Judgment normal.     Results for orders placed or performed in visit on 03/07/23  Bayer DCA Hb A1c Waived  Result Value Ref Range   HB A1C (BAYER DCA - WAIVED) 7.8 (H) 4.8 - 5.6 %  CBC with Differential/Platelet  Result Value Ref Range   WBC 10.0 3.4 - 10.8 x10E3/uL   RBC 4.00 3.77 - 5.28 x10E6/uL   Hemoglobin 12.0 11.1 - 15.9 g/dL   Hematocrit 95.2 84.1 - 46.6 %   MCV 91 79 - 97 fL   MCH 30.0 26.6 - 33.0 pg   MCHC 32.9 31.5 - 35.7 g/dL   RDW 32.4 40.1 - 02.7 %   Platelets 213 150 - 450 x10E3/uL   Neutrophils 76 Not Estab. %   Lymphs 12 Not Estab. %   Monocytes 9 Not Estab. %   Eos 3 Not Estab. %   Basos 0 Not Estab. %   Neutrophils Absolute 7.5 (H) 1.4 - 7.0 x10E3/uL   Lymphocytes Absolute 1.2 0.7 - 3.1 x10E3/uL   Monocytes Absolute 0.9 0.1 - 0.9 x10E3/uL   EOS (ABSOLUTE) 0.3 0.0 - 0.4 x10E3/uL   Basophils Absolute 0.0 0.0 - 0.2 x10E3/uL   Immature Granulocytes 0 Not Estab. %   Immature Grans (Abs) 0.0 0.0 - 0.1 x10E3/uL  CMP14+EGFR  Result Value Ref Range   Glucose 202 (H) 70 - 99 mg/dL   BUN 17 8 - 27 mg/dL   Creatinine, Ser 2.53 (H) 0.57 - 1.00 mg/dL   eGFR 39 (L) >66 YQ/IHK/7.42   BUN/Creatinine Ratio 13 12 - 28   Sodium 138 134 - 144 mmol/L   Potassium 4.6 3.5 - 5.2 mmol/L   Chloride 101 96 - 106 mmol/L   CO2 23 20 - 29 mmol/L   Calcium 9.8 8.7 - 10.3 mg/dL   Total Protein 6.5 6.0 - 8.5 g/dL   Albumin 4.1 3.7 - 4.7 g/dL   Globulin, Total  2.4 1.5 - 4.5 g/dL   Albumin/Globulin Ratio 1.7 1.2 - 2.2   Bilirubin Total 0.6 0.0 - 1.2 mg/dL   Alkaline Phosphatase 74 44 - 121 IU/L   AST 15 0 - 40 IU/L   ALT 6 0 - 32 IU/L  Lipid panel  Result Value Ref Range   Cholesterol, Total 114 100 - 199 mg/dL   Triglycerides 595 0 - 149 mg/dL   HDL 45 >63 mg/dL   VLDL Cholesterol Cal 20 5 - 40 mg/dL   LDL Chol Calc (NIH) 49 0 - 99 mg/dL   Chol/HDL Ratio 2.5 0.0 - 4.4 ratio       Pertinent labs & imaging results  that were available during my care of the patient were reviewed by me and considered in my medical decision making.  Assessment & Plan:  Kara Dean was seen today for insect bite.  Diagnoses and all orders for this visit:  Cellulitis of left lower extremity Will treat with below. Aware if symptoms persist or worsen, make a follow up with dermatology.  -     doxycycline (VIBRA-TABS) 100 MG tablet; Take 1 tablet (100 mg total) by mouth 2 (two) times daily for 10 days. 1 po bid     Continue all other maintenance medications.  Follow up plan: Return if symptoms worsen or fail to improve.   Continue healthy lifestyle choices, including diet (rich in fruits, vegetables, and lean proteins, and low in salt and simple carbohydrates) and exercise (at least 30 minutes of moderate physical activity daily).  Educational handout given for cellulitis  The above assessment and management plan was discussed with the patient. The patient verbalized understanding of and has agreed to the management plan. Patient is aware to call the clinic if they develop any new symptoms or if symptoms persist or worsen. Patient is aware when to return to the clinic for a follow-up visit. Patient educated on when it is appropriate to go to the emergency department.   Kari Baars, FNP-C [facility] [phone]

## 2023-06-30 ENCOUNTER — Encounter: Payer: Self-pay | Admitting: Nurse Practitioner

## 2023-06-30 ENCOUNTER — Ambulatory Visit (INDEPENDENT_AMBULATORY_CARE_PROVIDER_SITE_OTHER): Payer: 59 | Admitting: Nurse Practitioner

## 2023-06-30 VITALS — BP 147/67 | HR 59 | Temp 97.6°F | Resp 20 | Ht 61.0 in | Wt 120.0 lb

## 2023-06-30 DIAGNOSIS — E785 Hyperlipidemia, unspecified: Secondary | ICD-10-CM | POA: Diagnosis not present

## 2023-06-30 DIAGNOSIS — Z794 Long term (current) use of insulin: Secondary | ICD-10-CM | POA: Diagnosis not present

## 2023-06-30 DIAGNOSIS — R600 Localized edema: Secondary | ICD-10-CM | POA: Diagnosis not present

## 2023-06-30 DIAGNOSIS — N1831 Chronic kidney disease, stage 3a: Secondary | ICD-10-CM

## 2023-06-30 DIAGNOSIS — E119 Type 2 diabetes mellitus without complications: Secondary | ICD-10-CM | POA: Diagnosis not present

## 2023-06-30 DIAGNOSIS — F411 Generalized anxiety disorder: Secondary | ICD-10-CM

## 2023-06-30 DIAGNOSIS — I10 Essential (primary) hypertension: Secondary | ICD-10-CM | POA: Diagnosis not present

## 2023-06-30 DIAGNOSIS — F3342 Major depressive disorder, recurrent, in full remission: Secondary | ICD-10-CM

## 2023-06-30 DIAGNOSIS — I6930 Unspecified sequelae of cerebral infarction: Secondary | ICD-10-CM | POA: Diagnosis not present

## 2023-06-30 DIAGNOSIS — L989 Disorder of the skin and subcutaneous tissue, unspecified: Secondary | ICD-10-CM

## 2023-06-30 DIAGNOSIS — E1169 Type 2 diabetes mellitus with other specified complication: Secondary | ICD-10-CM | POA: Diagnosis not present

## 2023-06-30 DIAGNOSIS — K219 Gastro-esophageal reflux disease without esophagitis: Secondary | ICD-10-CM | POA: Diagnosis not present

## 2023-06-30 LAB — BAYER DCA HB A1C WAIVED: HB A1C (BAYER DCA - WAIVED): 7 % — ABNORMAL HIGH (ref 4.8–5.6)

## 2023-06-30 NOTE — Progress Notes (Signed)
Subjective:    Patient ID: Kara Dean, female    DOB: 08/12/1933, 89 y.o.   MRN: [id number]   Chief Complaint: medical management of chronic issues     HPI:  Kara Dean is a 89 y.o. who identifies as a female who was assigned female at birth.   Social history: Lives with: by herself Work history: retired   Water engineer in today for follow up of the following chronic medical issues:  1. Essential hypertension No c/o chest pain, sob or headache,. Does not check blood pressure at home. BP Readings from Last 3 Encounters:  06/24/23 (!) 142/62  03/07/23 (!) 135/56  02/17/23 (!) 168/66     2. Hyperlipidemia associated with type 2 diabetes mellitus (HCC) Has a very poor appetiite. Does no dedicated exercise. Lab Results  Component Value Date   CHOL 114 03/07/2023   HDL 45 03/07/2023   LDLCALC 49 03/07/2023   TRIG 111 03/07/2023   CHOLHDL 2.5 03/07/2023     3. Diabetes mellitus with insulin therapy (HCC) Does not check blood sugar very often. Is on lantus daily. When she checks them they are usually in the 200's Lab Results  Component Value Date   HGBA1C 7.8 (H) 03/07/2023     4. Gastroesophageal reflux disease without esophagitis Is on protonix daily and is doing well  5. Chronic kidney disease (CKD) stage G3a/A3, moderately decreased glomerular filtration rate (GFR) between 45-59 mL/min/1.73 square meter and albuminuria creatinine ratio greater than 300 mg/g (HCC) No voiding issues Lab Results  Component Value Date   CREATININE 1.33 (H) 03/07/2023     6. Recurrent major depressive disorder, in full remission (HCC) Is on celexa and is doing well    06/30/2023    2:54 PM 06/24/2023    9:45 AM 03/12/2023   12:06 PM  Depression screen PHQ 2/9  Decreased Interest 1 1 0  Down, Depressed, Hopeless 1 0 0  PHQ - 2 Score 2 1 0  Altered sleeping 0 1 0  Tired, decreased energy 0 2 0  Change in appetite 1 2 0  Feeling bad or failure about yourself  0 0 0   Trouble concentrating 0 2 0  Moving slowly or fidgety/restless 0 0 0  Suicidal thoughts 0 0 0  PHQ-9 Score 3 8 0  Difficult doing work/chores Not difficult at all Somewhat difficult Not difficult at all     7. GAD (generalized anxiety disorder) Is on ativan and is dong well    06/30/2023    2:55 PM 06/24/2023    9:45 AM 03/07/2023    2:03 PM 02/17/2023    9:06 AM  GAD 7 : Generalized Anxiety Score  Nervous, Anxious, on Edge 1 0 1 3  Control/stop worrying 1 1 1 3   Worry too much - different things 1 2 1 3   Trouble relaxing 1 1 0 2  Restless 1 0 0 0  Easily annoyed or irritable 1 1 0 3  Afraid - awful might happen 1 2 0 3  Total GAD 7 Score 7 7 3 17   Anxiety Difficulty Somewhat difficult Not difficult at all  Somewhat difficult      8. Peripheral edema Has some edema usually by the end of the day  9. Late effect of cerebrovascular accident (CVA) No permanent effects   New complaints: None today  Allergies  Allergen Reactions   Codeine Other (See Comments)    unknown   Latex Rash   Morphine Other (  See Comments)    unknown   Niacin Other (See Comments)    unknown   Sulfa Antibiotics Rash   Sulfonamide Derivatives Rash   Outpatient Encounter Medications as of 06/30/2023  Medication Sig   albuterol (VENTOLIN HFA) 108 (90 Base) MCG/ACT inhaler Inhale 2 puffs into the lungs every 6 (six) hours as needed for wheezing or shortness of breath.   amLODipine (NORVASC) 10 MG tablet Take 1 tablet (10 mg total) by mouth daily.   atorvastatin (LIPITOR) 10 MG tablet Take 1 tablet (10 mg total) by mouth daily. (NEEDS TO BE SEEN BEFORE NEXT REFILL)   B Complex Vitamins (B COMPLEX-B12) TABS Take 1 tablet by mouth daily.   Cholecalciferol (VITAMIN D3 PO) Take 1 capsule by mouth daily.   citalopram (CELEXA) 20 MG tablet Take 1 tablet (20 mg total) by mouth daily.   dipyridamole-aspirin (AGGRENOX) 200-25 MG 12hr capsule Take 1 capsule by mouth daily.   fluticasone (FLONASE) 50 MCG/ACT  nasal spray USE 2 SPRAYS IN EACH NOSTRIL ONCE DAILY   furosemide (LASIX) 20 MG tablet Take 1 tablet (20 mg total) by mouth daily.   glucose blood (ONETOUCH ULTRA) test strip CHECK BLOOD SUGER UP TO 3 TIMES A DAY   Insulin Pen Needle (PX EXTRA SHORT PEN NEEDLES) 31G X 6 MM MISC Use as directed   LANTUS SOLOSTAR 100 UNIT/ML Solostar Pen Inject 40 Units into the skin daily.   LORazepam (ATIVAN) 0.5 MG tablet Take 1 tablet (0.5 mg total) by mouth 2 (two) times daily.   Magnesium 250 MG TABS Take 250 mg by mouth daily.   metoprolol succinate (TOPROL-XL) 25 MG 24 hr tablet Take 1 tablet (25 mg total) by mouth daily. Take with or immediately following a meal.   olmesartan (BENICAR) 40 MG tablet Take 1 tablet (40 mg total) by mouth daily.   Omega-3 Fatty Acids (FISH OIL) 1000 MG CAPS Take 1 capsule by mouth daily.   pantoprazole (PROTONIX) 40 MG tablet Take 1 tablet (40 mg total) by mouth daily. (NEEDS TO BE SEEN BEFORE NEXT REFILL)   potassium chloride (KLOR-CON) 10 MEQ tablet Take 1 tablet (10 mEq total) by mouth as directed. Mon, Wed, and Fri only.   tolterodine (DETROL LA) 4 MG 24 hr capsule Take 1 capsule (4 mg total) by mouth daily.   [DISCONTINUED] doxycycline (VIBRA-TABS) 100 MG tablet Take 1 tablet (100 mg total) by mouth 2 (two) times daily for 10 days. 1 po bid   No facility-administered encounter medications on file as of 06/30/2023.    Past Surgical History:  Procedure Laterality Date   ABDOMINAL HYSTERECTOMY     BACK SURGERY  1994   CATARACT EXTRACTION W/PHACO Left 09/19/2017   Procedure: CATARACT EXTRACTION PHACO AND INTRAOCULAR LENS PLACEMENT (IOC);  Surgeon: Fabio Pierce, MD;  Location: [facility];  Service: Ophthalmology;  Laterality: Left;  CDE: 8.10   CATARACT EXTRACTION W/PHACO Right 10/10/2017   Procedure: CATARACT EXTRACTION PHACO AND INTRAOCULAR LENS PLACEMENT RIGHT EYE;  Surgeon: Fabio Pierce, MD;  Location: [facility];  Service: Ophthalmology;  Laterality: Right;  CDE: 9.60    CHOLECYSTECTOMY N/A 05/10/2021   Procedure: LAPAROSCOPIC CHOLECYSTECTOMY;  Surgeon: Berna Bue, MD;  Location: [facility];  Service: General;  Laterality: N/A;   COLON RESECTION  1990's   TUBAL LIGATION     VESICOVAGINAL FISTULA CLOSURE W/ TAH  1984    Family History  Problem Relation Age of Onset   Coronary artery disease Sister  stent x 3   Seizures Sister    Coronary artery disease Brother        stent   Hypertension Mother    Stroke Mother    Emphysema Father    Alcohol abuse Brother    Lung disease Brother    Heart attack Daughter       Controlled substance contract: n/a      Review of Systems  Constitutional:  Negative for diaphoresis.  Eyes:  Negative for pain.  Respiratory:  Negative for shortness of breath.   Cardiovascular:  Negative for chest pain, palpitations and leg swelling.  Gastrointestinal:  Negative for abdominal pain.  Endocrine: Negative for polydipsia.  Skin:  Negative for rash.  Neurological:  Negative for dizziness, weakness and headaches.  Hematological:  Does not bruise/bleed easily.  All other systems reviewed and are negative.      Objective:   Physical Exam Vitals and nursing note reviewed.  Constitutional:      General: She is not in acute distress.    Appearance: Normal appearance. She is well-developed.  HENT:     Head: Normocephalic.     Right Ear: Tympanic membrane normal.     Left Ear: Tympanic membrane normal.     Nose: Nose normal.     Mouth/Throat:     Mouth: Mucous membranes are moist.  Eyes:     Pupils: Pupils are equal, round, and reactive to light.  Neck:     Vascular: No carotid bruit or JVD.  Cardiovascular:     Rate and Rhythm: Normal rate and regular rhythm.     Heart sounds: Normal heart sounds.  Pulmonary:     Effort: Pulmonary effort is normal. No respiratory distress.     Breath sounds: Rales (thorughout) present. No wheezing.  Chest:     Chest wall: No tenderness.  Abdominal:     General:  Bowel sounds are normal. There is no distension or abdominal bruit.     Palpations: Abdomen is soft. There is no hepatomegaly, splenomegaly, mass or pulsatile mass.     Tenderness: There is no abdominal tenderness.  Musculoskeletal:        General: Normal range of motion.     Cervical back: Normal range of motion and neck supple.  Lymphadenopathy:     Cervical: No cervical adenopathy.  Skin:    General: Skin is warm and dry.     Comments: Flesh colored raised skin lesions on forearms and lower ext  Neurological:     Mental Status: She is alert and oriented to person, place, and time.     Deep Tendon Reflexes: Reflexes are normal and symmetric.  Psychiatric:        Behavior: Behavior normal.        Thought Content: Thought content normal.        Judgment: Judgment normal.    BP (!) 147/67   Pulse (!) 59   Temp 97.6 F (36.4 C) (Temporal)   Resp 20   Ht 5\' 1"  (1.549 m)   Wt 120 lb (54.4 kg)   SpO2 94%   BMI 22.67 kg/m   HGBA1c 7.0%    Assessment & Plan:  Kara Dean comes in today with chief complaint of Medical Management of Chronic Issues   Diagnosis and orders addressed:  1. Essential hypertension Low sodium diet - CBC with Differential/Platelet - CMP14+EGFR - Brain natriuretic peptide  2. Hyperlipidemia associated with type 2 diabetes mellitus (HCC) Low fat diet - Lipid panel  3. Diabetes mellitus with insulin therapy (HCC) Continue  to watch carbs in diet - Bayer DCA Hb A1c Waived  4. Gastroesophageal reflux disease without esophagitis Avoid spicy foods Do not eat 2 hours prior to bedtime   5. Chronic kidney disease (CKD) stage G3a/A3, moderately decreased glomerular filtration rate (GFR) between 45-59 mL/min/1.73 square meter and albuminuria creatinine ratio greater than 300 mg/g [facility]) Lab Results  Component Value Date   CREATININE 1.33 (H) 03/07/2023  '  6. Recurrent major depressive disorder, in full remission (HCC) Stress management  7. GAD  (generalized anxiety disorder)  8. Peripheral edema Elevate legs when sitting  9. Late effect of cerebrovascular accident (CVA)  10. Skin lesions Do not pick or scratch at lesions - Ambulatory referral to Dermatology   Labs pending Health Maintenance reviewed Diet and exercise encouraged  Follow up plan: 3 months   Mary-Margaret Daphine Deutscher, FNP

## 2023-07-01 LAB — CMP14+EGFR
ALT: 9 IU/L (ref 0–32)
AST: 18 IU/L (ref 0–40)
Albumin: 4.2 g/dL (ref 3.7–4.7)
Alkaline Phosphatase: 77 IU/L (ref 44–121)
BUN/Creatinine Ratio: 21 (ref 12–28)
BUN: 30 mg/dL — ABNORMAL HIGH (ref 8–27)
Bilirubin Total: 0.5 mg/dL (ref 0.0–1.2)
CO2: 24 mmol/L (ref 20–29)
Calcium: 10.3 mg/dL (ref 8.7–10.3)
Chloride: 101 mmol/L (ref 96–106)
Creatinine, Ser: 1.46 mg/dL — ABNORMAL HIGH (ref 0.57–1.00)
Globulin, Total: 2.8 g/dL (ref 1.5–4.5)
Glucose: 67 mg/dL — ABNORMAL LOW (ref 70–99)
Potassium: 4.5 mmol/L (ref 3.5–5.2)
Sodium: 140 mmol/L (ref 134–144)
Total Protein: 7 g/dL (ref 6.0–8.5)
eGFR: 35 mL/min/{1.73_m2} — ABNORMAL LOW (ref >59)

## 2023-07-01 LAB — LIPID PANEL
Chol/HDL Ratio: 3.4 ratio (ref 0.0–4.4)
Cholesterol, Total: 148 mg/dL (ref 100–199)
HDL: 43 mg/dL (ref >39)
LDL Chol Calc (NIH): 76 mg/dL (ref 0–99)
Triglycerides: 173 mg/dL — ABNORMAL HIGH (ref 0–149)
VLDL Cholesterol Cal: 29 mg/dL (ref 5–40)

## 2023-07-01 LAB — CBC WITH DIFFERENTIAL/PLATELET
Basophils Absolute: 0 10*3/uL (ref 0.0–0.2)
Basos: 1 %
EOS (ABSOLUTE): 0.3 10*3/uL (ref 0.0–0.4)
Eos: 4 %
Hematocrit: 39.2 % (ref 34.0–46.6)
Hemoglobin: 13 g/dL (ref 11.1–15.9)
Immature Grans (Abs): 0 10*3/uL (ref 0.0–0.1)
Immature Granulocytes: 0 %
Lymphocytes Absolute: 1.3 10*3/uL (ref 0.7–3.1)
Lymphs: 15 %
MCH: 29.8 pg (ref 26.6–33.0)
MCHC: 33.2 g/dL (ref 31.5–35.7)
MCV: 90 fL (ref 79–97)
Monocytes Absolute: 0.9 10*3/uL (ref 0.1–0.9)
Monocytes: 10 %
Neutrophils Absolute: 6 10*3/uL (ref 1.4–7.0)
Neutrophils: 70 %
Platelets: 190 10*3/uL (ref 150–450)
RBC: 4.36 x10E6/uL (ref 3.77–5.28)
RDW: 13.1 % (ref 11.7–15.4)
WBC: 8.5 10*3/uL (ref 3.4–10.8)

## 2023-07-17 DIAGNOSIS — C44729 Squamous cell carcinoma of skin of left lower limb, including hip: Secondary | ICD-10-CM | POA: Diagnosis not present

## 2023-07-31 DIAGNOSIS — L84 Corns and callosities: Secondary | ICD-10-CM | POA: Diagnosis not present

## 2023-07-31 DIAGNOSIS — L603 Nail dystrophy: Secondary | ICD-10-CM | POA: Diagnosis not present

## 2023-07-31 DIAGNOSIS — E1142 Type 2 diabetes mellitus with diabetic polyneuropathy: Secondary | ICD-10-CM | POA: Diagnosis not present

## 2023-07-31 DIAGNOSIS — M79676 Pain in unspecified toe(s): Secondary | ICD-10-CM | POA: Diagnosis not present

## 2023-08-14 DIAGNOSIS — Z85828 Personal history of other malignant neoplasm of skin: Secondary | ICD-10-CM | POA: Diagnosis not present

## 2023-08-14 DIAGNOSIS — L821 Other seborrheic keratosis: Secondary | ICD-10-CM | POA: Diagnosis not present

## 2023-08-15 ENCOUNTER — Encounter: Payer: Self-pay | Admitting: Cardiovascular Disease

## 2023-08-15 ENCOUNTER — Ambulatory Visit: Payer: 59 | Attending: Cardiovascular Disease | Admitting: Cardiovascular Disease

## 2023-08-15 VITALS — BP 120/68 | HR 59 | Ht 60.0 in | Wt 121.2 lb

## 2023-08-15 DIAGNOSIS — I5032 Chronic diastolic (congestive) heart failure: Secondary | ICD-10-CM

## 2023-08-15 DIAGNOSIS — I209 Angina pectoris, unspecified: Secondary | ICD-10-CM | POA: Diagnosis not present

## 2023-08-15 DIAGNOSIS — E782 Mixed hyperlipidemia: Secondary | ICD-10-CM

## 2023-08-15 DIAGNOSIS — I10 Essential (primary) hypertension: Secondary | ICD-10-CM | POA: Diagnosis not present

## 2023-08-15 NOTE — Progress Notes (Signed)
Cardiology Office Note:    Date:  08/15/2023   ID:  Kara Dean, DOB 03/25/1933, MRN [id number]  PCP:  Bennie Pierini, FNP   [facility] Providers Cardiologist:  Tonny Bollman, MD Cardiology APP:  Kennon Rounds     Referring MD: Bennie Pierini, *   Chief Complaint  Patient presents with   Chest Pain    History of Present Illness:    Kara Dean is a 89 y.o. female with a hx of hypertension, heart failure with preserved ejection fraction, remote TIA, and mixed hyperlipidemia, presenting for follow-up evaluation.  The patient is here with a friend today.  She reports intermittent chest pain that she describes as a sharp discomfort across the lower chest.  This can be brought on by emotional stress.  She has not related it to physical exertion but she is not too active anymore.  No orthopnea, PND, edema, or shortness of breath.  She has not taken nitroglycerin.  Sometimes her chest pains come on without warning.  They have always been self-limited and have not required any specific intervention.  She has noticed these for the last few months.  Episodes last no longer than 10 to 15 minutes.  Current Medications: Current Meds  Medication Sig   amLODipine (NORVASC) 10 MG tablet Take 1 tablet (10 mg total) by mouth daily.   atorvastatin (LIPITOR) 10 MG tablet Take 1 tablet (10 mg total) by mouth daily. (NEEDS TO BE SEEN BEFORE NEXT REFILL)   B Complex Vitamins (B COMPLEX-B12) TABS Take 1 tablet by mouth daily.   Cholecalciferol (VITAMIN D3 PO) Take 1 capsule by mouth daily.   citalopram (CELEXA) 20 MG tablet Take 1 tablet (20 mg total) by mouth daily.   dipyridamole-aspirin (AGGRENOX) 200-25 MG 12hr capsule Take 1 capsule by mouth daily.   fluticasone (FLONASE) 50 MCG/ACT nasal spray USE 2 SPRAYS IN EACH NOSTRIL ONCE DAILY   furosemide (LASIX) 20 MG tablet Take 1 tablet (20 mg total) by mouth daily.   glucose blood (ONETOUCH ULTRA) test strip CHECK  BLOOD SUGER UP TO 3 TIMES A DAY   Insulin Pen Needle (PX EXTRA SHORT PEN NEEDLES) 31G X 6 MM MISC Use as directed   LANTUS SOLOSTAR 100 UNIT/ML Solostar Pen Inject 40 Units into the skin daily.   LORazepam (ATIVAN) 0.5 MG tablet Take 1 tablet (0.5 mg total) by mouth 2 (two) times daily.   Magnesium 250 MG TABS Take 250 mg by mouth daily.   metoprolol succinate (TOPROL-XL) 25 MG 24 hr tablet Take 1 tablet (25 mg total) by mouth daily. Take with or immediately following a meal.   olmesartan (BENICAR) 40 MG tablet Take 1 tablet (40 mg total) by mouth daily.   Omega-3 Fatty Acids (FISH OIL) 1000 MG CAPS Take 1 capsule by mouth daily.   pantoprazole (PROTONIX) 40 MG tablet Take 1 tablet (40 mg total) by mouth daily. (NEEDS TO BE SEEN BEFORE NEXT REFILL)   potassium chloride (KLOR-CON) 10 MEQ tablet Take 1 tablet (10 mEq total) by mouth as directed. Mon, Wed, and Fri only.   tolterodine (DETROL LA) 4 MG 24 hr capsule Take 1 capsule (4 mg total) by mouth daily.   [DISCONTINUED] albuterol (VENTOLIN HFA) 108 (90 Base) MCG/ACT inhaler Inhale 2 puffs into the lungs every 6 (six) hours as needed for wheezing or shortness of breath.     Allergies:   Codeine, Latex, Morphine, Niacin, Sulfa antibiotics, and Sulfonamide derivatives   ROS:  Cardiology Office Note:    Date:  08/15/2023   ID:  Kara Dean, DOB 03/25/1933, MRN [id number]  PCP:  Bennie Pierini, FNP   [facility] Providers Cardiologist:  Tonny Bollman, MD Cardiology APP:  Kennon Rounds     Referring MD: Bennie Pierini, *   Chief Complaint  Patient presents with   Chest Pain    History of Present Illness:    Kara Dean is a 89 y.o. female with a hx of hypertension, heart failure with preserved ejection fraction, remote TIA, and mixed hyperlipidemia, presenting for follow-up evaluation.  The patient is here with a friend today.  She reports intermittent chest pain that she describes as a sharp discomfort across the lower chest.  This can be brought on by emotional stress.  She has not related it to physical exertion but she is not too active anymore.  No orthopnea, PND, edema, or shortness of breath.  She has not taken nitroglycerin.  Sometimes her chest pains come on without warning.  They have always been self-limited and have not required any specific intervention.  She has noticed these for the last few months.  Episodes last no longer than 10 to 15 minutes.  Current Medications: Current Meds  Medication Sig   amLODipine (NORVASC) 10 MG tablet Take 1 tablet (10 mg total) by mouth daily.   atorvastatin (LIPITOR) 10 MG tablet Take 1 tablet (10 mg total) by mouth daily. (NEEDS TO BE SEEN BEFORE NEXT REFILL)   B Complex Vitamins (B COMPLEX-B12) TABS Take 1 tablet by mouth daily.   Cholecalciferol (VITAMIN D3 PO) Take 1 capsule by mouth daily.   citalopram (CELEXA) 20 MG tablet Take 1 tablet (20 mg total) by mouth daily.   dipyridamole-aspirin (AGGRENOX) 200-25 MG 12hr capsule Take 1 capsule by mouth daily.   fluticasone (FLONASE) 50 MCG/ACT nasal spray USE 2 SPRAYS IN EACH NOSTRIL ONCE DAILY   furosemide (LASIX) 20 MG tablet Take 1 tablet (20 mg total) by mouth daily.   glucose blood (ONETOUCH ULTRA) test strip CHECK  BLOOD SUGER UP TO 3 TIMES A DAY   Insulin Pen Needle (PX EXTRA SHORT PEN NEEDLES) 31G X 6 MM MISC Use as directed   LANTUS SOLOSTAR 100 UNIT/ML Solostar Pen Inject 40 Units into the skin daily.   LORazepam (ATIVAN) 0.5 MG tablet Take 1 tablet (0.5 mg total) by mouth 2 (two) times daily.   Magnesium 250 MG TABS Take 250 mg by mouth daily.   metoprolol succinate (TOPROL-XL) 25 MG 24 hr tablet Take 1 tablet (25 mg total) by mouth daily. Take with or immediately following a meal.   olmesartan (BENICAR) 40 MG tablet Take 1 tablet (40 mg total) by mouth daily.   Omega-3 Fatty Acids (FISH OIL) 1000 MG CAPS Take 1 capsule by mouth daily.   pantoprazole (PROTONIX) 40 MG tablet Take 1 tablet (40 mg total) by mouth daily. (NEEDS TO BE SEEN BEFORE NEXT REFILL)   potassium chloride (KLOR-CON) 10 MEQ tablet Take 1 tablet (10 mEq total) by mouth as directed. Mon, Wed, and Fri only.   tolterodine (DETROL LA) 4 MG 24 hr capsule Take 1 capsule (4 mg total) by mouth daily.   [DISCONTINUED] albuterol (VENTOLIN HFA) 108 (90 Base) MCG/ACT inhaler Inhale 2 puffs into the lungs every 6 (six) hours as needed for wheezing or shortness of breath.     Allergies:   Codeine, Latex, Morphine, Niacin, Sulfa antibiotics, and Sulfonamide derivatives   ROS:  Please see the history of present illness.    All other systems reviewed and are negative.  EKGs/Labs/Other Studies Reviewed:    The following studies were reviewed today: Cardiac Studies & Procedures     STRESS TESTS  NM MYOCAR MULTI W/SPECT W 05/09/2021  Narrative CLINICAL DATA:  Pre-op assessment, intermediate wrist surgery. History of diabetes, hypertension and hyperlipidemia.  EXAM: MYOCARDIAL IMAGING WITH SPECT (REST AND PHARMACOLOGIC-STRESS)  GATED LEFT VENTRICULAR WALL MOTION STUDY  LEFT VENTRICULAR EJECTION FRACTION  TECHNIQUE: Standard myocardial SPECT imaging was performed after resting intravenous injection of 10.2 mCi Tc-79m tetrofosmin.  Subsequently, intravenous infusion of Lexiscan was performed under the supervision of the Cardiology staff. At peak effect of the drug, 33.0 mCi Tc-35m tetrofosmin was injected intravenously and standard myocardial SPECT imaging was performed. Quantitative gated imaging was also performed to evaluate left ventricular wall motion, and estimate left ventricular ejection fraction.  COMPARISON:  None.  FINDINGS: Perfusion: No decreased activity in the left ventricle on stress imaging to suggest reversible ischemia or infarction. Mild apical thinning.  Wall Motion: Normal left ventricular wall motion. No left ventricular dilation.  Left Ventricular Ejection Fraction: 63 %  End diastolic volume 75 ml  End systolic volume 28 ml  IMPRESSION: 1. No reversible ischemia or infarction.  2. Normal left ventricular wall motion.  3. Left ventricular ejection fraction 63%  4. Non invasive risk stratification*: Low  *2012 Appropriate Use Criteria for Coronary Revascularization Focused Update: J Am Coll Cardiol. 2012;59(9):857-881. http://content.dementiazones.com.aspx?articleid=1201161   Electronically Signed By: Carey Bullocks M.D. On: 05/09/2021 15:01   ECHOCARDIOGRAM  ECHOCARDIOGRAM COMPLETE 12/19/2021  Narrative ECHOCARDIOGRAM REPORT    Patient Name:   Kara Dean Date of Exam: 12/19/2021 Medical Rec #:  [id number]      Height:       61.0 in Accession #:    [id number]     Weight:       114.5 lb Date of Birth:  29-Sep-1933     BSA:          1.490 m Patient Age:    88 years       BP:           148/82 mmHg Patient Gender: F              HR:           63 bpm. Exam Location:  Outpatient  Procedure: 2D Echo, Cardiac Doppler, Color Doppler and Strain Analysis  Indications:    R06.9 DOE; R60.0 Lower extremity edema  History:        Patient has prior history of Echocardiogram examinations, most recent 04/14/2020. TIA, Arrythmias:Bradycardia and  PVC, Signs/Symptoms:Dyspnea and Edema; Risk Factors:Hypertension, Dyslipidemia, Diabetes and CKD stage 3a, current passive exposure to smoking. Patient denies chest pain but does complain of DOE with leg edema.  Sonographer:    Carlos American RVT, RDCS (AE), RDMS Referring Phys: 82 TESSA N CONTE  IMPRESSIONS   1. Left ventricular ejection fraction, by estimation, is 60 to 65%. The left ventricle has normal function. The left ventricle has no regional wall motion abnormalities. Left ventricular diastolic parameters are indeterminate. The average left ventricular global longitudinal strain is -14.1 %. The global longitudinal strain is abnormal. 2. Right ventricular systolic function is normal. The right ventricular size is normal. 3. Left atrial size was mildly dilated. 4. The mitral valve is abnormal. Mild mitral valve regurgitation. No evidence of mitral stenosis. 5. Tricuspid valve regurgitation is moderate. 6. The aortic valve is  Please see the history of present illness.    All other systems reviewed and are negative.  EKGs/Labs/Other Studies Reviewed:    The following studies were reviewed today: Cardiac Studies & Procedures     STRESS TESTS  NM MYOCAR MULTI W/SPECT W 05/09/2021  Narrative CLINICAL DATA:  Pre-op assessment, intermediate wrist surgery. History of diabetes, hypertension and hyperlipidemia.  EXAM: MYOCARDIAL IMAGING WITH SPECT (REST AND PHARMACOLOGIC-STRESS)  GATED LEFT VENTRICULAR WALL MOTION STUDY  LEFT VENTRICULAR EJECTION FRACTION  TECHNIQUE: Standard myocardial SPECT imaging was performed after resting intravenous injection of 10.2 mCi Tc-79m tetrofosmin.  Subsequently, intravenous infusion of Lexiscan was performed under the supervision of the Cardiology staff. At peak effect of the drug, 33.0 mCi Tc-35m tetrofosmin was injected intravenously and standard myocardial SPECT imaging was performed. Quantitative gated imaging was also performed to evaluate left ventricular wall motion, and estimate left ventricular ejection fraction.  COMPARISON:  None.  FINDINGS: Perfusion: No decreased activity in the left ventricle on stress imaging to suggest reversible ischemia or infarction. Mild apical thinning.  Wall Motion: Normal left ventricular wall motion. No left ventricular dilation.  Left Ventricular Ejection Fraction: 63 %  End diastolic volume 75 ml  End systolic volume 28 ml  IMPRESSION: 1. No reversible ischemia or infarction.  2. Normal left ventricular wall motion.  3. Left ventricular ejection fraction 63%  4. Non invasive risk stratification*: Low  *2012 Appropriate Use Criteria for Coronary Revascularization Focused Update: J Am Coll Cardiol. 2012;59(9):857-881. http://content.dementiazones.com.aspx?articleid=1201161   Electronically Signed By: Carey Bullocks M.D. On: 05/09/2021 15:01   ECHOCARDIOGRAM  ECHOCARDIOGRAM COMPLETE 12/19/2021  Narrative ECHOCARDIOGRAM REPORT    Patient Name:   Kara Dean Date of Exam: 12/19/2021 Medical Rec #:  [id number]      Height:       61.0 in Accession #:    [id number]     Weight:       114.5 lb Date of Birth:  29-Sep-1933     BSA:          1.490 m Patient Age:    88 years       BP:           148/82 mmHg Patient Gender: F              HR:           63 bpm. Exam Location:  Outpatient  Procedure: 2D Echo, Cardiac Doppler, Color Doppler and Strain Analysis  Indications:    R06.9 DOE; R60.0 Lower extremity edema  History:        Patient has prior history of Echocardiogram examinations, most recent 04/14/2020. TIA, Arrythmias:Bradycardia and  PVC, Signs/Symptoms:Dyspnea and Edema; Risk Factors:Hypertension, Dyslipidemia, Diabetes and CKD stage 3a, current passive exposure to smoking. Patient denies chest pain but does complain of DOE with leg edema.  Sonographer:    Carlos American RVT, RDCS (AE), RDMS Referring Phys: 82 TESSA N CONTE  IMPRESSIONS   1. Left ventricular ejection fraction, by estimation, is 60 to 65%. The left ventricle has normal function. The left ventricle has no regional wall motion abnormalities. Left ventricular diastolic parameters are indeterminate. The average left ventricular global longitudinal strain is -14.1 %. The global longitudinal strain is abnormal. 2. Right ventricular systolic function is normal. The right ventricular size is normal. 3. Left atrial size was mildly dilated. 4. The mitral valve is abnormal. Mild mitral valve regurgitation. No evidence of mitral stenosis. 5. Tricuspid valve regurgitation is moderate. 6. The aortic valve is  Cardiology Office Note:    Date:  08/15/2023   ID:  Kara Dean, DOB 03/25/1933, MRN [id number]  PCP:  Bennie Pierini, FNP   [facility] Providers Cardiologist:  Tonny Bollman, MD Cardiology APP:  Kennon Rounds     Referring MD: Bennie Pierini, *   Chief Complaint  Patient presents with   Chest Pain    History of Present Illness:    Kara Dean is a 89 y.o. female with a hx of hypertension, heart failure with preserved ejection fraction, remote TIA, and mixed hyperlipidemia, presenting for follow-up evaluation.  The patient is here with a friend today.  She reports intermittent chest pain that she describes as a sharp discomfort across the lower chest.  This can be brought on by emotional stress.  She has not related it to physical exertion but she is not too active anymore.  No orthopnea, PND, edema, or shortness of breath.  She has not taken nitroglycerin.  Sometimes her chest pains come on without warning.  They have always been self-limited and have not required any specific intervention.  She has noticed these for the last few months.  Episodes last no longer than 10 to 15 minutes.  Current Medications: Current Meds  Medication Sig   amLODipine (NORVASC) 10 MG tablet Take 1 tablet (10 mg total) by mouth daily.   atorvastatin (LIPITOR) 10 MG tablet Take 1 tablet (10 mg total) by mouth daily. (NEEDS TO BE SEEN BEFORE NEXT REFILL)   B Complex Vitamins (B COMPLEX-B12) TABS Take 1 tablet by mouth daily.   Cholecalciferol (VITAMIN D3 PO) Take 1 capsule by mouth daily.   citalopram (CELEXA) 20 MG tablet Take 1 tablet (20 mg total) by mouth daily.   dipyridamole-aspirin (AGGRENOX) 200-25 MG 12hr capsule Take 1 capsule by mouth daily.   fluticasone (FLONASE) 50 MCG/ACT nasal spray USE 2 SPRAYS IN EACH NOSTRIL ONCE DAILY   furosemide (LASIX) 20 MG tablet Take 1 tablet (20 mg total) by mouth daily.   glucose blood (ONETOUCH ULTRA) test strip CHECK  BLOOD SUGER UP TO 3 TIMES A DAY   Insulin Pen Needle (PX EXTRA SHORT PEN NEEDLES) 31G X 6 MM MISC Use as directed   LANTUS SOLOSTAR 100 UNIT/ML Solostar Pen Inject 40 Units into the skin daily.   LORazepam (ATIVAN) 0.5 MG tablet Take 1 tablet (0.5 mg total) by mouth 2 (two) times daily.   Magnesium 250 MG TABS Take 250 mg by mouth daily.   metoprolol succinate (TOPROL-XL) 25 MG 24 hr tablet Take 1 tablet (25 mg total) by mouth daily. Take with or immediately following a meal.   olmesartan (BENICAR) 40 MG tablet Take 1 tablet (40 mg total) by mouth daily.   Omega-3 Fatty Acids (FISH OIL) 1000 MG CAPS Take 1 capsule by mouth daily.   pantoprazole (PROTONIX) 40 MG tablet Take 1 tablet (40 mg total) by mouth daily. (NEEDS TO BE SEEN BEFORE NEXT REFILL)   potassium chloride (KLOR-CON) 10 MEQ tablet Take 1 tablet (10 mEq total) by mouth as directed. Mon, Wed, and Fri only.   tolterodine (DETROL LA) 4 MG 24 hr capsule Take 1 capsule (4 mg total) by mouth daily.   [DISCONTINUED] albuterol (VENTOLIN HFA) 108 (90 Base) MCG/ACT inhaler Inhale 2 puffs into the lungs every 6 (six) hours as needed for wheezing or shortness of breath.     Allergies:   Codeine, Latex, Morphine, Niacin, Sulfa antibiotics, and Sulfonamide derivatives   ROS:  Cardiology Office Note:    Date:  08/15/2023   ID:  Kara Dean, DOB 03/25/1933, MRN [id number]  PCP:  Bennie Pierini, FNP   [facility] Providers Cardiologist:  Tonny Bollman, MD Cardiology APP:  Kennon Rounds     Referring MD: Bennie Pierini, *   Chief Complaint  Patient presents with   Chest Pain    History of Present Illness:    Kara Dean is a 89 y.o. female with a hx of hypertension, heart failure with preserved ejection fraction, remote TIA, and mixed hyperlipidemia, presenting for follow-up evaluation.  The patient is here with a friend today.  She reports intermittent chest pain that she describes as a sharp discomfort across the lower chest.  This can be brought on by emotional stress.  She has not related it to physical exertion but she is not too active anymore.  No orthopnea, PND, edema, or shortness of breath.  She has not taken nitroglycerin.  Sometimes her chest pains come on without warning.  They have always been self-limited and have not required any specific intervention.  She has noticed these for the last few months.  Episodes last no longer than 10 to 15 minutes.  Current Medications: Current Meds  Medication Sig   amLODipine (NORVASC) 10 MG tablet Take 1 tablet (10 mg total) by mouth daily.   atorvastatin (LIPITOR) 10 MG tablet Take 1 tablet (10 mg total) by mouth daily. (NEEDS TO BE SEEN BEFORE NEXT REFILL)   B Complex Vitamins (B COMPLEX-B12) TABS Take 1 tablet by mouth daily.   Cholecalciferol (VITAMIN D3 PO) Take 1 capsule by mouth daily.   citalopram (CELEXA) 20 MG tablet Take 1 tablet (20 mg total) by mouth daily.   dipyridamole-aspirin (AGGRENOX) 200-25 MG 12hr capsule Take 1 capsule by mouth daily.   fluticasone (FLONASE) 50 MCG/ACT nasal spray USE 2 SPRAYS IN EACH NOSTRIL ONCE DAILY   furosemide (LASIX) 20 MG tablet Take 1 tablet (20 mg total) by mouth daily.   glucose blood (ONETOUCH ULTRA) test strip CHECK  BLOOD SUGER UP TO 3 TIMES A DAY   Insulin Pen Needle (PX EXTRA SHORT PEN NEEDLES) 31G X 6 MM MISC Use as directed   LANTUS SOLOSTAR 100 UNIT/ML Solostar Pen Inject 40 Units into the skin daily.   LORazepam (ATIVAN) 0.5 MG tablet Take 1 tablet (0.5 mg total) by mouth 2 (two) times daily.   Magnesium 250 MG TABS Take 250 mg by mouth daily.   metoprolol succinate (TOPROL-XL) 25 MG 24 hr tablet Take 1 tablet (25 mg total) by mouth daily. Take with or immediately following a meal.   olmesartan (BENICAR) 40 MG tablet Take 1 tablet (40 mg total) by mouth daily.   Omega-3 Fatty Acids (FISH OIL) 1000 MG CAPS Take 1 capsule by mouth daily.   pantoprazole (PROTONIX) 40 MG tablet Take 1 tablet (40 mg total) by mouth daily. (NEEDS TO BE SEEN BEFORE NEXT REFILL)   potassium chloride (KLOR-CON) 10 MEQ tablet Take 1 tablet (10 mEq total) by mouth as directed. Mon, Wed, and Fri only.   tolterodine (DETROL LA) 4 MG 24 hr capsule Take 1 capsule (4 mg total) by mouth daily.   [DISCONTINUED] albuterol (VENTOLIN HFA) 108 (90 Base) MCG/ACT inhaler Inhale 2 puffs into the lungs every 6 (six) hours as needed for wheezing or shortness of breath.     Allergies:   Codeine, Latex, Morphine, Niacin, Sulfa antibiotics, and Sulfonamide derivatives   ROS:

## 2023-08-15 NOTE — Patient Instructions (Signed)
Medication Instructions:  Your physician recommends that you continue on your current medications as directed. Please refer to the Current Medication list given to you today.  *If you need a refill on your cardiac medications before your next appointment, please call your pharmacy*  Testing/Procedures: Lexiscan Myoview stress test Your physician has requested that you have a lexiscan myoview. For further information please visit [email or web address] Please follow instruction sheet, as given.  Follow-Up: At [facility], you and your health needs are our priority.  As part of our continuing mission to provide you with exceptional heart care, we have created designated Provider Care Teams.  These Care Teams include your primary Cardiologist (physician) and Advanced Practice Providers (APPs -  Physician Assistants and Nurse Practitioners) who all work together to provide you with the care you need, when you need it.  Your next appointment:   1 year(s)  Provider:   Tonny Bollman, MD       Other Instructions See separate sheet for stress test instructions

## 2023-08-15 NOTE — Assessment & Plan Note (Signed)
Blood pressure controlled on a combination of amlodipine, furosemide, metoprolol succinate, and olmesartan.

## 2023-09-04 ENCOUNTER — Other Ambulatory Visit: Payer: Self-pay | Admitting: Nurse Practitioner

## 2023-09-04 DIAGNOSIS — I10 Essential (primary) hypertension: Secondary | ICD-10-CM

## 2023-09-05 ENCOUNTER — Ambulatory Visit (INDEPENDENT_AMBULATORY_CARE_PROVIDER_SITE_OTHER): Payer: 59 | Admitting: Nurse Practitioner

## 2023-09-05 ENCOUNTER — Encounter: Payer: Self-pay | Admitting: Nurse Practitioner

## 2023-09-05 VITALS — BP 130/70 | HR 65 | Temp 98.6°F | Resp 20 | Ht 60.0 in | Wt 120.0 lb

## 2023-09-05 DIAGNOSIS — I10 Essential (primary) hypertension: Secondary | ICD-10-CM | POA: Diagnosis not present

## 2023-09-05 DIAGNOSIS — F411 Generalized anxiety disorder: Secondary | ICD-10-CM | POA: Diagnosis not present

## 2023-09-05 DIAGNOSIS — E1169 Type 2 diabetes mellitus with other specified complication: Secondary | ICD-10-CM | POA: Diagnosis not present

## 2023-09-05 DIAGNOSIS — E119 Type 2 diabetes mellitus without complications: Secondary | ICD-10-CM | POA: Diagnosis not present

## 2023-09-05 DIAGNOSIS — E785 Hyperlipidemia, unspecified: Secondary | ICD-10-CM | POA: Diagnosis not present

## 2023-09-05 DIAGNOSIS — K219 Gastro-esophageal reflux disease without esophagitis: Secondary | ICD-10-CM | POA: Diagnosis not present

## 2023-09-05 DIAGNOSIS — I129 Hypertensive chronic kidney disease with stage 1 through stage 4 chronic kidney disease, or unspecified chronic kidney disease: Secondary | ICD-10-CM

## 2023-09-05 DIAGNOSIS — N1831 Chronic kidney disease, stage 3a: Secondary | ICD-10-CM

## 2023-09-05 DIAGNOSIS — R600 Localized edema: Secondary | ICD-10-CM | POA: Diagnosis not present

## 2023-09-05 DIAGNOSIS — E1122 Type 2 diabetes mellitus with diabetic chronic kidney disease: Secondary | ICD-10-CM

## 2023-09-05 DIAGNOSIS — Z794 Long term (current) use of insulin: Secondary | ICD-10-CM

## 2023-09-05 DIAGNOSIS — I6930 Unspecified sequelae of cerebral infarction: Secondary | ICD-10-CM

## 2023-09-05 DIAGNOSIS — F3342 Major depressive disorder, recurrent, in full remission: Secondary | ICD-10-CM

## 2023-09-05 LAB — BAYER DCA HB A1C WAIVED: HB A1C (BAYER DCA - WAIVED): 7.4 % — ABNORMAL HIGH (ref 4.8–5.6)

## 2023-09-05 MED ORDER — AMLODIPINE BESYLATE 10 MG PO TABS: 10.0000 mg | ORAL_TABLET | Freq: Every day | ORAL | 1 refills | Status: DC

## 2023-09-05 MED ORDER — POTASSIUM CHLORIDE ER 10 MEQ PO TBCR: 10.0000 meq | EXTENDED_RELEASE_TABLET | ORAL | Status: DC

## 2023-09-05 MED ORDER — PANTOPRAZOLE SODIUM 40 MG PO TBEC: 40.0000 mg | DELAYED_RELEASE_TABLET | Freq: Every day | ORAL | 1 refills | Status: DC

## 2023-09-05 MED ORDER — OLMESARTAN MEDOXOMIL 40 MG PO TABS: 40.0000 mg | ORAL_TABLET | Freq: Every day | ORAL | 1 refills | Status: DC

## 2023-09-05 MED ORDER — TOLTERODINE TARTRATE ER 4 MG PO CP24: 4.0000 mg | ORAL_CAPSULE | Freq: Every day | ORAL | 1 refills | Status: DC

## 2023-09-05 MED ORDER — LORAZEPAM 0.5 MG PO TABS: 0.5000 mg | ORAL_TABLET | Freq: Two times a day (BID) | ORAL | 5 refills | Status: DC

## 2023-09-05 MED ORDER — CITALOPRAM HYDROBROMIDE 20 MG PO TABS: 20.0000 mg | ORAL_TABLET | Freq: Every day | ORAL | 1 refills | Status: DC

## 2023-09-05 MED ORDER — LANTUS SOLOSTAR 100 UNIT/ML ~~LOC~~ SOPN: 40.0000 [IU] | PEN_INJECTOR | Freq: Every day | SUBCUTANEOUS | 5 refills | Status: DC

## 2023-09-05 MED ORDER — FUROSEMIDE 20 MG PO TABS: 20.0000 mg | ORAL_TABLET | Freq: Every day | ORAL | 1 refills | Status: DC

## 2023-09-05 MED ORDER — ATORVASTATIN CALCIUM 10 MG PO TABS: 10.0000 mg | ORAL_TABLET | Freq: Every day | ORAL | 1 refills | Status: DC

## 2023-09-05 MED ORDER — METOPROLOL SUCCINATE ER 25 MG PO TB24: 25.0000 mg | ORAL_TABLET | Freq: Every day | ORAL | 1 refills | Status: DC

## 2023-09-05 MED ORDER — ASPIRIN-DIPYRIDAMOLE ER 25-200 MG PO CP12: 1.0000 | ORAL_CAPSULE | Freq: Every day | ORAL | 1 refills | Status: DC

## 2023-09-05 NOTE — Patient Instructions (Signed)
Fall Prevention in the Home, Adult Falls can cause injuries and can happen to people of all ages. There are many things you can do to make your home safer and to help prevent falls. What actions can I take to prevent falls? General information Use good lighting in all rooms. Make sure to: Replace any light bulbs that burn out. Turn on the lights in dark areas and use night-lights. Keep items that you use often in easy-to-reach places. Lower the shelves around your home if needed. Move furniture so that there are clear paths around it. Do not use throw rugs or other things on the floor that can make you trip. If any of your floors are uneven, fix them. Add color or contrast paint or tape to clearly mark and help you see: Grab bars or handrails. First and last steps of staircases. Where the edge of each step is. If you use a ladder or stepladder: Make sure that it is fully opened. Do not climb a closed ladder. Make sure the sides of the ladder are locked in place. Have someone hold the ladder while you use it. Know where your pets are as you move through your home. What can I do in the bathroom?     Keep the floor dry. Clean up any water on the floor right away. Remove soap buildup in the bathtub or shower. Buildup makes bathtubs and showers slippery. Use non-skid mats or decals on the floor of the bathtub or shower. Attach bath mats securely with double-sided, non-slip rug tape. If you need to sit down in the shower, use a non-slip stool. Install grab bars by the toilet and in the bathtub and shower. Do not use towel bars as grab bars. What can I do in the bedroom? Make sure that you have a light by your bed that is easy to reach. Do not use any sheets or blankets on your bed that hang to the floor. Have a firm chair or bench with side arms that you can use for support when you get dressed. What can I do in the kitchen? Clean up any spills right away. If you need to reach something  above you, use a step stool with a grab bar. Keep electrical cords out of the way. Do not use floor polish or wax that makes floors slippery. What can I do with my stairs? Do not leave anything on the stairs. Make sure that you have a light switch at the top and the bottom of the stairs. Make sure that there are handrails on both sides of the stairs. Fix handrails that are broken or loose. Install non-slip stair treads on all your stairs if they do not have carpet. Avoid having throw rugs at the top or bottom of the stairs. Choose a carpet that does not hide the edge of the steps on the stairs. Make sure that the carpet is firmly attached to the stairs. Fix carpet that is loose or worn. What can I do on the outside of my home? Use bright outdoor lighting. Fix the edges of walkways and driveways and fix any cracks. Clear paths of anything that can make you trip, such as tools or rocks. Add color or contrast paint or tape to clearly mark and help you see anything that might make you trip as you walk through a door, such as a raised step or threshold. Trim any bushes or trees on paths to your home. Check to see if handrails are loose   or broken and that both sides of all steps have handrails. Install guardrails along the edges of any raised decks and porches. Have leaves, snow, or ice cleared regularly. Use sand, salt, or ice melter on paths if you live where there is ice and snow during the winter. Clean up any spills in your garage right away. This includes grease or oil spills. What other actions can I take? Review your medicines with your doctor. Some medicines can cause dizziness or changes in blood pressure, which increase your risk of falling. Wear shoes that: Have a low heel. Do not wear high heels. Have rubber bottoms and are closed at the toe. Feel good on your feet and fit well. Use tools that help you move around if needed. These include: Canes. Walkers. Scooters. Crutches. Ask  your doctor what else you can do to help prevent falls. This may include seeing a physical therapist to learn to do exercises to move better and get stronger. Where to find more information Centers for Disease Control and Prevention, [employer]: cdc.gov National Institute on Aging: nia.nih.gov National Institute on Aging: nia.nih.gov Contact a doctor if: You are afraid of falling at home. You feel weak, drowsy, or dizzy at home. You fall at home. Get help right away if you: Lose consciousness or have trouble moving after a fall. Have a fall that causes a head injury. These symptoms may be an emergency. Get help right away. Call 911. Do not wait to see if the symptoms will go away. Do not drive yourself to the hospital. This information is not intended to replace advice given to you by your health care provider. Make sure you discuss any questions you have with your health care provider. Document Revised: 06/24/2022 Document Reviewed: 06/24/2022 Elsevier Patient Education  2024 [employer] Inc.  

## 2023-09-05 NOTE — Progress Notes (Signed)
Subjective:    Patient ID: Kara Dean, female    DOB: [birth year], 89 y.o.   MRN: [id number]   Chief Complaint: Medical Management of Chronic Issues    HPI:  Kara Dean is a 89 y.o. who identifies as a female who was assigned female at birth.   Social history: Lives with: granddaughter Work history: retired   Water engineer in today for follow up of the following chronic medical issues:  1. Essential hypertension No c/o chest pain, sob and headache. Does not check blood pressure at home. BP Readings from Last 3 Encounters:  09/05/23 130/70  08/15/23 120/68  06/30/23 (!) 147/67     2. Hyperlipidemia associated with type 2 diabetes mellitus (HCC) Doe snot watch diet very closely. Has a poor appetite. Lab Results  Component Value Date   CHOL 148 06/30/2023   HDL 43 06/30/2023   LDLCALC 76 06/30/2023   TRIG 173 (H) 06/30/2023   CHOLHDL 3.4 06/30/2023     3. Diabetes mellitus with insulin therapy (HCC) Doe snot check blood sugars very often at home. Runns high when she checks them. Lab Results  Component Value Date   HGBA1C 7.0 (H) 06/30/2023      4. Gastroesophageal reflux disease without esophagitis Takes protonix daily which works well for her.  5. Chronic kidney disease (CKD) stage G3a/A3, moderately decreased glomerular filtration rate (GFR) between 45-59 mL/min/1.73 square meter and albuminuria creatinine ratio greater than 300 mg/g (HCC) No voiding issue Lab Results  Component Value Date   CREATININE 1.46 (H) 06/30/2023     6. Late effect of cerebrovascular accident (CVA) No permanent effects  7. Peripheral edema Has occasionally  8. Recurrent major depressive disorder, in full remission (HCC) 9. GAD (generalized anxiety disorder) Is on celexa daily and is doing well.    09/05/2023   12:10 PM 06/30/2023    2:55 PM 06/24/2023    9:45 AM 03/07/2023    2:03 PM  GAD 7 : Generalized Anxiety Score  Nervous, Anxious, on Edge 2 1 0 1  Control/stop  worrying 1 1 1 1   Worry too much - different things 1 1 2 1   Trouble relaxing 0 1 1 0  Restless 0 1 0 0  Easily annoyed or irritable 0 1 1 0  Afraid - awful might happen 1 1 2  0  Total GAD 7 Score 5 7 7 3   Anxiety Difficulty Not difficult at all Somewhat difficult Not difficult at all        09/05/2023   12:09 PM 06/30/2023    2:54 PM 06/24/2023    9:45 AM  Depression screen PHQ 2/9  Decreased Interest 1 1 1   Down, Depressed, Hopeless 2 1 0  PHQ - 2 Score 3 2 1   Altered sleeping 1 0 1  Tired, decreased energy 2 0 2  Change in appetite 1 1 2   Feeling bad or failure about yourself  1 0 0  Trouble concentrating 1 0 2  Moving slowly or fidgety/restless 0 0 0  Suicidal thoughts 0 0 0  PHQ-9 Score 9 3 8   Difficult doing work/chores Not difficult at all Not difficult at all Somewhat difficult      New complaints: None today  Allergies  Allergen Reactions   Codeine Other (See Comments)    unknown   Latex Rash   Morphine Other (See Comments)    unknown   Niacin Other (See Comments)    unknown   Sulfa Antibiotics Rash  Sulfonamide Derivatives Rash   Outpatient Encounter Medications as of 09/05/2023  Medication Sig   amLODipine (NORVASC) 10 MG tablet Take 1 tablet (10 mg total) by mouth daily.   atorvastatin (LIPITOR) 10 MG tablet Take 1 tablet (10 mg total) by mouth daily. (NEEDS TO BE SEEN BEFORE NEXT REFILL)   B Complex Vitamins (B COMPLEX-B12) TABS Take 1 tablet by mouth daily.   Cholecalciferol (VITAMIN D3 PO) Take 1 capsule by mouth daily.   citalopram (CELEXA) 20 MG tablet Take 1 tablet (20 mg total) by mouth daily.   dipyridamole-aspirin (AGGRENOX) 200-25 MG 12hr capsule Take 1 capsule by mouth daily.   fluticasone (FLONASE) 50 MCG/ACT nasal spray USE 2 SPRAYS IN EACH NOSTRIL ONCE DAILY   furosemide (LASIX) 20 MG tablet Take 1 tablet (20 mg total) by mouth daily.   glucose blood (ONETOUCH ULTRA) test strip CHECK BLOOD SUGER UP TO 3 TIMES A DAY   Insulin Pen Needle  (PX EXTRA SHORT PEN NEEDLES) 31G X 6 MM MISC Use as directed   LANTUS SOLOSTAR 100 UNIT/ML Solostar Pen Inject 40 Units into the skin daily.   LORazepam (ATIVAN) 0.5 MG tablet Take 1 tablet (0.5 mg total) by mouth 2 (two) times daily.   Magnesium 250 MG TABS Take 250 mg by mouth daily.   metoprolol succinate (TOPROL-XL) 25 MG 24 hr tablet TAKE ONE TABLET DAILY AFTER a meal   olmesartan (BENICAR) 40 MG tablet Take 1 tablet (40 mg total) by mouth daily.   Omega-3 Fatty Acids (FISH OIL) 1000 MG CAPS Take 1 capsule by mouth daily.   pantoprazole (PROTONIX) 40 MG tablet Take 1 tablet (40 mg total) by mouth daily. (NEEDS TO BE SEEN BEFORE NEXT REFILL)   potassium chloride (KLOR-CON) 10 MEQ tablet Take 1 tablet (10 mEq total) by mouth as directed. Mon, Wed, and Fri only.   tolterodine (DETROL LA) 4 MG 24 hr capsule Take 1 capsule (4 mg total) by mouth daily.   No facility-administered encounter medications on file as of 09/05/2023.    Past Surgical History:  Procedure Laterality Date   ABDOMINAL HYSTERECTOMY     BACK SURGERY  1994   CATARACT EXTRACTION W/PHACO Left 09/19/2017   Procedure: CATARACT EXTRACTION PHACO AND INTRAOCULAR LENS PLACEMENT (IOC);  Surgeon: Fabio Pierce, MD;  Location: [facility];  Service: Ophthalmology;  Laterality: Left;  CDE: 8.10   CATARACT EXTRACTION W/PHACO Right 10/10/2017   Procedure: CATARACT EXTRACTION PHACO AND INTRAOCULAR LENS PLACEMENT RIGHT EYE;  Surgeon: Fabio Pierce, MD;  Location: [facility];  Service: Ophthalmology;  Laterality: Right;  CDE: 9.60   CHOLECYSTECTOMY N/A 05/10/2021   Procedure: LAPAROSCOPIC CHOLECYSTECTOMY;  Surgeon: Berna Bue, MD;  Location: [facility];  Service: General;  Laterality: N/A;   COLON RESECTION  1990's   TUBAL LIGATION     VESICOVAGINAL FISTULA CLOSURE W/ TAH  1984    Family History  Problem Relation Age of Onset   Coronary artery disease Sister        stent x 3   Seizures Sister    Coronary artery disease Brother        stent    Hypertension Mother    Stroke Mother    Emphysema Father    Alcohol abuse Brother    Lung disease Brother    Heart attack Daughter       Controlled substance contract: 12/11/22     Review of Systems  Constitutional:  Negative for diaphoresis.  Eyes:  Negative for pain.  Respiratory:  Negative for shortness of breath.   Cardiovascular:  Negative for chest pain, palpitations and leg swelling.  Gastrointestinal:  Negative for abdominal pain.  Endocrine: Negative for polydipsia.  Skin:  Negative for rash.  Neurological:  Negative for dizziness, weakness and headaches.  Hematological:  Does not bruise/bleed easily.  All other systems reviewed and are negative.      Objective:   Physical Exam Vitals and nursing note reviewed.  Constitutional:      General: She is not in acute distress.    Appearance: Normal appearance. She is well-developed.  HENT:     Head: Normocephalic.     Right Ear: Tympanic membrane normal.     Left Ear: Tympanic membrane normal.     Nose: Nose normal.     Mouth/Throat:     Mouth: Mucous membranes are moist.  Eyes:     Pupils: Pupils are equal, round, and reactive to light.  Neck:     Vascular: No carotid bruit or JVD.  Cardiovascular:     Rate and Rhythm: Normal rate and regular rhythm.     Heart sounds: Normal heart sounds.  Pulmonary:     Effort: Pulmonary effort is normal. No respiratory distress.     Breath sounds: Normal breath sounds. No wheezing or rales.  Chest:     Chest wall: No tenderness.  Abdominal:     General: Bowel sounds are normal. There is no distension or abdominal bruit.     Palpations: Abdomen is soft. There is no hepatomegaly, splenomegaly, mass or pulsatile mass.     Tenderness: There is no abdominal tenderness.  Musculoskeletal:        General: Normal range of motion.     Cervical back: Normal range of motion and neck supple.  Lymphadenopathy:     Cervical: No cervical adenopathy.  Skin:    General: Skin is  warm and dry.  Neurological:     Mental Status: She is alert and oriented to person, place, and time.     Deep Tendon Reflexes: Reflexes are normal and symmetric.  Psychiatric:        Behavior: Behavior normal.        Thought Content: Thought content normal.        Judgment: Judgment normal.    BP 130/70   Pulse 65   Temp 98.6 F (37 C) (Temporal)   Resp 20   Ht 5' (1.524 m)   Wt 120 lb (54.4 kg)   SpO2 94%   BMI 23.44 kg/m    HGBA1c 7.3%     Assessment & Plan:   Kara Dean comes in today with chief complaint of Medical Management of Chronic Issues   Diagnosis and orders addressed:  1. Essential hypertension Low sodium diet - CBC with Differential/Platelet - CMP14+EGFR - amLODipine (NORVASC) 10 MG tablet; Take 1 tablet (10 mg total) by mouth daily.  Dispense: 90 tablet; Refill: 1 - metoprolol succinate (TOPROL-XL) 25 MG 24 hr tablet; Take 1 tablet (25 mg total) by mouth daily.  Dispense: 90 tablet; Refill: 1 - olmesartan (BENICAR) 40 MG tablet; Take 1 tablet (40 mg total) by mouth daily.  Dispense: 90 tablet; Refill: 1 - potassium chloride (KLOR-CON) 10 MEQ tablet; Take 1 tablet (10 mEq total) by mouth as directed. Mon, Wed, and Fri only.  2. Hyperlipidemia associated with type 2 diabetes mellitus (HCC) Low fat diet - Lipid panel - atorvastatin (LIPITOR) 10 MG tablet; Take 1 tablet (10 mg total) by mouth daily. (NEEDS  TO BE SEEN BEFORE NEXT REFILL)  Dispense: 90 tablet; Refill: 1  3. Diabetes mellitus with insulin therapy (HCC) Continue to watch carbs in diet - Bayer DCA Hb A1c Waived - LANTUS SOLOSTAR 100 UNIT/ML Solostar Pen; Inject 40 Units into the skin daily.  Dispense: 15 mL; Refill: 5  4. Gastroesophageal reflux disease without esophagitis Avoid spicy foods Do not eat 2 hours prior to bedtime  - pantoprazole (PROTONIX) 40 MG tablet; Take 1 tablet (40 mg total) by mouth daily. (NEEDS TO BE SEEN BEFORE NEXT REFILL)  Dispense: 90 tablet; Refill: 1  5.  Chronic kidney disease (CKD) stage G3a/A3, moderately decreased glomerular filtration rate (GFR) between 45-59 mL/min/1.73 square meter and albuminuria creatinine ratio greater than 300 mg/g (HCC) Labs pending  6. Late effect of cerebrovascular accident (CVA) Report any weakness - dipyridamole-aspirin (AGGRENOX) 200-25 MG 12hr capsule; Take 1 capsule by mouth daily.  Dispense: 90 capsule; Refill: 1  7. Peripheral edema Elevtae legs when sitting - furosemide (LASIX) 20 MG tablet; Take 1 tablet (20 mg total) by mouth daily.  Dispense: 90 tablet; Refill: 1  8. Recurrent major depressive disorder, in full remission (HCC) Stress management  9. GAD (generalized anxiety disorder) - citalopram (CELEXA) 20 MG tablet; Take 1 tablet (20 mg total) by mouth daily.  Dispense: 90 tablet; Refill: 1 - LORazepam (ATIVAN) 0.5 MG tablet; Take 1 tablet (0.5 mg total) by mouth 2 (two) times daily.  Dispense: 60 tablet; Refill: 5   Labs pending Health Maintenance reviewed Diet and exercise encouraged  Follow up plan: 6 month   Mary-Margaret Daphine Deutscher, FNP

## 2023-09-06 LAB — CBC WITH DIFFERENTIAL/PLATELET
Basophils Absolute: 0.1 10*3/uL (ref 0.0–0.2)
Basos: 1 %
EOS (ABSOLUTE): 0.3 10*3/uL (ref 0.0–0.4)
Eos: 3 %
Hematocrit: 38.3 % (ref 34.0–46.6)
Hemoglobin: 12.6 g/dL (ref 11.1–15.9)
Immature Grans (Abs): 0 10*3/uL (ref 0.0–0.1)
Immature Granulocytes: 0 %
Lymphocytes Absolute: 0.9 10*3/uL (ref 0.7–3.1)
Lymphs: 10 %
MCH: 30.4 pg (ref 26.6–33.0)
MCHC: 32.9 g/dL (ref 31.5–35.7)
MCV: 92 fL (ref 79–97)
Monocytes Absolute: 0.7 10*3/uL (ref 0.1–0.9)
Monocytes: 8 %
Neutrophils Absolute: 6.5 10*3/uL (ref 1.4–7.0)
Neutrophils: 78 %
Platelets: 219 10*3/uL (ref 150–450)
RBC: 4.15 x10E6/uL (ref 3.77–5.28)
RDW: 13.5 % (ref 11.7–15.4)
WBC: 8.4 10*3/uL (ref 3.4–10.8)

## 2023-09-06 LAB — CMP14+EGFR
ALT: 9 [IU]/L (ref 0–32)
AST: 14 [IU]/L (ref 0–40)
Albumin: 4.2 g/dL (ref 3.7–4.7)
Alkaline Phosphatase: 82 [IU]/L (ref 44–121)
BUN/Creatinine Ratio: 20 (ref 12–28)
BUN: 25 mg/dL (ref 8–27)
Bilirubin Total: 0.5 mg/dL (ref 0.0–1.2)
CO2: 25 mmol/L (ref 20–29)
Calcium: 10.3 mg/dL (ref 8.7–10.3)
Chloride: 101 mmol/L (ref 96–106)
Creatinine, Ser: 1.27 mg/dL — ABNORMAL HIGH (ref 0.57–1.00)
Globulin, Total: 2.7 g/dL (ref 1.5–4.5)
Glucose: 216 mg/dL — ABNORMAL HIGH (ref 70–99)
Potassium: 4.6 mmol/L (ref 3.5–5.2)
Sodium: 142 mmol/L (ref 134–144)
Total Protein: 6.9 g/dL (ref 6.0–8.5)
eGFR: 41 mL/min/{1.73_m2} — ABNORMAL LOW (ref >59)

## 2023-09-06 LAB — LIPID PANEL
Chol/HDL Ratio: 3.8 ratio (ref 0.0–4.4)
Cholesterol, Total: 142 mg/dL (ref 100–199)
HDL: 37 mg/dL — ABNORMAL LOW (ref >39)
LDL Chol Calc (NIH): 60 mg/dL (ref 0–99)
Triglycerides: 286 mg/dL — ABNORMAL HIGH (ref 0–149)
VLDL Cholesterol Cal: 45 mg/dL — ABNORMAL HIGH (ref 5–40)

## 2023-09-09 ENCOUNTER — Telehealth (HOSPITAL_COMMUNITY): Payer: Self-pay

## 2023-09-09 NOTE — Telephone Encounter (Signed)
Attempted to contact the patient, she was not available. Will try again later. S.Tycen Dockter  CCT

## 2023-09-16 ENCOUNTER — Encounter (HOSPITAL_COMMUNITY): Payer: 59

## 2023-09-17 ENCOUNTER — Ambulatory Visit (INDEPENDENT_AMBULATORY_CARE_PROVIDER_SITE_OTHER): Payer: 59 | Admitting: Family Medicine

## 2023-09-17 ENCOUNTER — Encounter: Payer: Self-pay | Admitting: Family Medicine

## 2023-09-17 VITALS — BP 149/66 | HR 63 | Temp 98.5°F | Ht 60.0 in | Wt 122.0 lb

## 2023-09-17 DIAGNOSIS — K148 Other diseases of tongue: Secondary | ICD-10-CM | POA: Diagnosis not present

## 2023-09-17 DIAGNOSIS — I10 Essential (primary) hypertension: Secondary | ICD-10-CM

## 2023-09-17 DIAGNOSIS — Z77018 Contact with and (suspected) exposure to other hazardous metals: Secondary | ICD-10-CM

## 2023-09-17 NOTE — Progress Notes (Signed)
Subjective:  Patient ID: Kara Dean, female    DOB: 02-22-1933, 89 y.o.   MRN: [id number]  Patient Care Team: Bennie Pierini, FNP as PCP - General (Nurse Practitioner) Tonny Bollman, MD as PCP - Cardiology (Cardiology) Fabio Pierce, MD as Consulting Physician (Ophthalmology) Derryl Harbor, OD (Optometry) Spero Curb Garth Bigness as Physician Assistant (Dermatology) Isaiah Blakes, LCSW as Social Worker (Licensed Clinical Social Worker) Alben Spittle, Aida Raider as Physician Assistant (Cardiology)   Chief Complaint:  Mouth Lesions  HPI: Kara Dean is a 89 y.o. female presenting on 09/17/2023 for No chief complaint on file.  States that she received letter from her apartment complex that she was exposed to heavy metals due to pipes. She would like testing for heavy metals. Denies any symptoms.   In addition, patient noticed yesterday that her tongue has a darkened spot. Reports that she had a fever over the weekend. Last week had flu and covid injection. She is unsure if she bit the site or had an injury. Denies itching, soreness, pain at the site. Denies any tobacco use.states that it is improving. She does use dentures and she is taking aggrenox.    Relevant past medical, surgical, family, and social history reviewed and updated as indicated.  Allergies and medications reviewed and updated. Data reviewed: Chart in Epic.   Past Medical History:  Diagnosis Date   Anxiety    Carotid artery disease (HCC)    Carotid US 11/2018:  bilateral ICA 1-39 >> repeat 2 years.    Cataract    Crohn's colitis (HCC)    CVA (cerebral infarction)    No deficits   Depression    Diabetes mellitus    type 2   Dyslipidemia    GERD (gastroesophageal reflux disease)    History of TIAs    no deficits   Hyperlipidemia    Hypertension    Hyponatremia    Nodular basal cell carcinoma (BCC) 04/22/2018   left prox, nasal root (tx p bx)   Osteoarthritis    SCC (squamous cell  carcinoma) 04/22/2018   left shin,superior-(tx p bx), Left shin,inferior-(tx p bx )    Past Surgical History:  Procedure Laterality Date   ABDOMINAL HYSTERECTOMY     BACK SURGERY  1994   CATARACT EXTRACTION W/PHACO Left 09/19/2017   Procedure: CATARACT EXTRACTION PHACO AND INTRAOCULAR LENS PLACEMENT (IOC);  Surgeon: Fabio Pierce, MD;  Location: [facility];  Service: Ophthalmology;  Laterality: Left;  CDE: 8.10   CATARACT EXTRACTION W/PHACO Right 10/10/2017   Procedure: CATARACT EXTRACTION PHACO AND INTRAOCULAR LENS PLACEMENT RIGHT EYE;  Surgeon: Fabio Pierce, MD;  Location: [facility];  Service: Ophthalmology;  Laterality: Right;  CDE: 9.60   CHOLECYSTECTOMY N/A 05/10/2021   Procedure: LAPAROSCOPIC CHOLECYSTECTOMY;  Surgeon: Berna Bue, MD;  Location: [facility];  Service: General;  Laterality: N/A;   COLON RESECTION  1990's   TUBAL LIGATION     VESICOVAGINAL FISTULA CLOSURE W/ TAH  1984    Social History   Socioeconomic History   Marital status: Widowed    Spouse name: Not on file   Number of children: 6   Years of education: 10   Highest education level: 10th grade  Occupational History   Occupation: Retired    Comment: [employer]  Tobacco Use   Smoking status: Never    Passive exposure: Current   Smokeless tobacco: Never  Vaping Use   Vaping status: Never Used  Substance and Sexual Activity  Alcohol use: No   Drug use: No   Sexual activity: Not Currently  Other Topics Concern   Not on file  Social History Narrative   Mrs Heynen is retired and widowed. She lives at home and her granddaughter lives with her. She lives within walking distance to her 2 daughter's houses. Has an indoor cat. Her sister moved to [place] and she doesn't get to get to visit with her often but does talk with her on the phone. She has 6 children and many grandchildren and great grandchildren.    Social Determinants of Health   Financial Resource Strain: Low Risk  (03/12/2023)   Overall Financial  Resource Strain (CARDIA)    Difficulty of Paying Living Expenses: Not hard at all  Food Insecurity: No Food Insecurity (03/12/2023)   Hunger Vital Sign    Worried About Running Out of Food in the Last Year: Never true    Ran Out of Food in the Last Year: Never true  Transportation Needs: No Transportation Needs (03/12/2023)   PRAPARE - Administrator, Civil Service (Medical): No    Lack of Transportation (Non-Medical): No  Physical Activity: Insufficiently Active (03/12/2023)   Exercise Vital Sign    Days of Exercise per Week: 3 days    Minutes of Exercise per Session: 30 min  Stress: No Stress Concern Present (03/12/2023)   [employer] of Occupational Health - Occupational Stress Questionnaire    Feeling of Stress : Not at all  Social Connections: Socially Isolated (03/12/2023)   Social Connection and Isolation Panel [NHANES]    Frequency of Communication with Friends and Family: More than three times a week    Frequency of Social Gatherings with Friends and Family: More than three times a week    Attends Religious Services: Never    Database administrator or Organizations: No    Attends Banker Meetings: Never    Marital Status: Widowed  Intimate Partner Violence: Not At Risk (03/12/2023)   Humiliation, Afraid, Rape, and Kick questionnaire    Fear of Current or Ex-Partner: No    Emotionally Abused: No    Physically Abused: No    Sexually Abused: No    Outpatient Encounter Medications as of 09/17/2023  Medication Sig   amLODipine (NORVASC) 10 MG tablet Take 1 tablet (10 mg total) by mouth daily.   atorvastatin (LIPITOR) 10 MG tablet Take 1 tablet (10 mg total) by mouth daily. (NEEDS TO BE SEEN BEFORE NEXT REFILL)   B Complex Vitamins (B COMPLEX-B12) TABS Take 1 tablet by mouth daily.   Cholecalciferol (VITAMIN D3 PO) Take 1 capsule by mouth daily.   citalopram (CELEXA) 20 MG tablet Take 1 tablet (20 mg total) by mouth daily.   dipyridamole-aspirin (AGGRENOX)  200-25 MG 12hr capsule Take 1 capsule by mouth daily.   fluticasone (FLONASE) 50 MCG/ACT nasal spray USE 2 SPRAYS IN EACH NOSTRIL ONCE DAILY   furosemide (LASIX) 20 MG tablet Take 1 tablet (20 mg total) by mouth daily.   glucose blood (ONETOUCH ULTRA) test strip CHECK BLOOD SUGER UP TO 3 TIMES A DAY   Insulin Pen Needle (PX EXTRA SHORT PEN NEEDLES) 31G X 6 MM MISC Use as directed   LANTUS SOLOSTAR 100 UNIT/ML Solostar Pen Inject 40 Units into the skin daily.   LORazepam (ATIVAN) 0.5 MG tablet Take 1 tablet (0.5 mg total) by mouth 2 (two) times daily.   Magnesium 250 MG TABS Take 250 mg by mouth daily.  metoprolol succinate (TOPROL-XL) 25 MG 24 hr tablet Take 1 tablet (25 mg total) by mouth daily.   olmesartan (BENICAR) 40 MG tablet Take 1 tablet (40 mg total) by mouth daily.   Omega-3 Fatty Acids (FISH OIL) 1000 MG CAPS Take 1 capsule by mouth daily.   pantoprazole (PROTONIX) 40 MG tablet Take 1 tablet (40 mg total) by mouth daily. (NEEDS TO BE SEEN BEFORE NEXT REFILL)   potassium chloride (KLOR-CON) 10 MEQ tablet Take 1 tablet (10 mEq total) by mouth as directed. Mon, Wed, and Fri only.   tolterodine (DETROL LA) 4 MG 24 hr capsule Take 1 capsule (4 mg total) by mouth daily.   No facility-administered encounter medications on file as of 09/17/2023.    Allergies  Allergen Reactions   Codeine Other (See Comments)    unknown   Latex Rash   Morphine Other (See Comments)    unknown   Niacin Other (See Comments)    unknown   Sulfa Antibiotics Rash   Sulfonamide Derivatives Rash    Review of Systems As per HPI  Objective:  BP (!) 149/66   Pulse 63   Temp 98.5 F (36.9 C)   Ht 5' (1.524 m)   Wt 122 lb (55.3 kg)   SpO2 95%   BMI 23.83 kg/m    Wt Readings from Last 3 Encounters:  09/17/23 122 lb (55.3 kg)  09/05/23 120 lb (54.4 kg)  08/15/23 121 lb 3.2 oz (55 kg)    Physical Exam Constitutional:      General: She is awake. She is not in acute distress.    Appearance:  Normal appearance. She is well-developed and well-groomed. She is not ill-appearing, toxic-appearing or diaphoretic.  HENT:     Mouth/Throat:      Comments: Darkened, blue/purple lesion on right side of tongue, nontender to palpation, no other lesions/white patches  Cardiovascular:     Rate and Rhythm: Normal rate and regular rhythm.     Pulses: Normal pulses.          Radial pulses are 2+ on the right side and 2+ on the left side.       Posterior tibial pulses are 2+ on the right side and 2+ on the left side.     Heart sounds: Normal heart sounds. No murmur heard.    No gallop.  Pulmonary:     Effort: Pulmonary effort is normal. No respiratory distress.     Breath sounds: Normal breath sounds. No stridor. No wheezing, rhonchi or rales.  Musculoskeletal:     Cervical back: Full passive range of motion without pain and neck supple.     Right lower leg: No edema.     Left lower leg: No edema.  Skin:    General: Skin is warm.     Capillary Refill: Capillary refill takes less than 2 seconds.  Neurological:     General: No focal deficit present.     Mental Status: She is alert, oriented to person, place, and time and easily aroused. Mental status is at baseline.     GCS: GCS eye subscore is 4. GCS verbal subscore is 5. GCS motor subscore is 6.     Motor: No weakness.  Psychiatric:        Attention and Perception: Attention and perception normal.        Mood and Affect: Mood and affect normal.        Speech: Speech normal.        Behavior:  Behavior normal. Behavior is cooperative.        Thought Content: Thought content normal. Thought content does not include homicidal or suicidal ideation. Thought content does not include homicidal or suicidal plan.        Cognition and Memory: Cognition and memory normal.        Judgment: Judgment normal.     Results for orders placed or performed in visit on 09/05/23  Bayer DCA Hb A1c Waived  Result Value Ref Range   HB A1C (BAYER DCA - WAIVED)  7.4 (H) 4.8 - 5.6 %  CBC with Differential/Platelet  Result Value Ref Range   WBC 8.4 3.4 - 10.8 x10E3/uL   RBC 4.15 3.77 - 5.28 x10E6/uL   Hemoglobin 12.6 11.1 - 15.9 g/dL   Hematocrit 16.1 09.6 - 46.6 %   MCV 92 79 - 97 fL   MCH 30.4 26.6 - 33.0 pg   MCHC 32.9 31.5 - 35.7 g/dL   RDW 04.5 40.9 - 81.1 %   Platelets 219 150 - 450 x10E3/uL   Neutrophils 78 Not Estab. %   Lymphs 10 Not Estab. %   Monocytes 8 Not Estab. %   Eos 3 Not Estab. %   Basos 1 Not Estab. %   Neutrophils Absolute 6.5 1.4 - 7.0 x10E3/uL   Lymphocytes Absolute 0.9 0.7 - 3.1 x10E3/uL   Monocytes Absolute 0.7 0.1 - 0.9 x10E3/uL   EOS (ABSOLUTE) 0.3 0.0 - 0.4 x10E3/uL   Basophils Absolute 0.1 0.0 - 0.2 x10E3/uL   Immature Granulocytes 0 Not Estab. %   Immature Grans (Abs) 0.0 0.0 - 0.1 x10E3/uL  CMP14+EGFR  Result Value Ref Range   Glucose 216 (H) 70 - 99 mg/dL   BUN 25 8 - 27 mg/dL   Creatinine, Ser 9.14 (H) 0.57 - 1.00 mg/dL   eGFR 41 (L) >78 GN/FAO/1.30   BUN/Creatinine Ratio 20 12 - 28   Sodium 142 134 - 144 mmol/L   Potassium 4.6 3.5 - 5.2 mmol/L   Chloride 101 96 - 106 mmol/L   CO2 25 20 - 29 mmol/L   Calcium 10.3 8.7 - 10.3 mg/dL   Total Protein 6.9 6.0 - 8.5 g/dL   Albumin 4.2 3.7 - 4.7 g/dL   Globulin, Total 2.7 1.5 - 4.5 g/dL   Bilirubin Total 0.5 0.0 - 1.2 mg/dL   Alkaline Phosphatase 82 44 - 121 IU/L   AST 14 0 - 40 IU/L   ALT 9 0 - 32 IU/L  Lipid panel  Result Value Ref Range   Cholesterol, Total 142 100 - 199 mg/dL   Triglycerides 865 (H) 0 - 149 mg/dL   HDL 37 (L) >78 mg/dL   VLDL Cholesterol Cal 45 (H) 5 - 40 mg/dL   LDL Chol Calc (NIH) 60 0 - 99 mg/dL   Chol/HDL Ratio 3.8 0.0 - 4.4 ratio       09/05/2023   12:09 PM 06/30/2023    2:54 PM 06/24/2023    9:45 AM 03/12/2023   12:06 PM 03/07/2023    2:02 PM  Depression screen PHQ 2/9  Decreased Interest 1 1 1  0 0  Down, Depressed, Hopeless 2 1 0 0 0  PHQ - 2 Score 3 2 1  0 0  Altered sleeping 1 0 1 0 0  Tired, decreased energy 2 0 2  0 1  Change in appetite 1 1 2  0 0  Feeling bad or failure about yourself  1 0 0 0 0  Trouble concentrating  1 0 2 0 0  Moving slowly or fidgety/restless 0 0 0 0 0  Suicidal thoughts 0 0 0 0 0  PHQ-9 Score 9 3 8  0 1  Difficult doing work/chores Not difficult at all Not difficult at all Somewhat difficult Not difficult at all Not difficult at all       09/05/2023   12:10 PM 06/30/2023    2:55 PM 06/24/2023    9:45 AM 03/07/2023    2:03 PM  GAD 7 : Generalized Anxiety Score  Nervous, Anxious, on Edge 2 1 0 1  Control/stop worrying 1 1 1 1   Worry too much - different things 1 1 2 1   Trouble relaxing 0 1 1 0  Restless 0 1 0 0  Easily annoyed or irritable 0 1 1 0  Afraid - awful might happen 1 1 2  0  Total GAD 7 Score 5 7 7 3   Anxiety Difficulty Not difficult at all Somewhat difficult Not difficult at all    Pertinent labs & imaging results that were available during my care of the patient were reviewed by me and considered in my medical decision making.  Assessment & Plan:  Kodi was seen today for mouth lesions.  Diagnoses and all orders for this visit:  Abnormal color of tongue Suspected abrasion or injury especially with aggrenox use. Will watch for 2 weeks, if does not improve will refer.   Exposure to heavy metals Labs as below. Will communicate results to patient once available. Will await results to determine next steps.  -     Heavy Metals Profile II, Blood -     Cancel: Copper, Blood -     Copper, Serum  Essential hypertension Not at goal in office today. Stated that she just took her medications before coming to appt. Patient to monitor BP at home and follow up with PCP  Continue all other maintenance medications.  Follow up plan: Return if symptoms worsen or fail to improve.  Continue healthy lifestyle choices, including diet (rich in fruits, vegetables, and lean proteins, and low in salt and simple carbohydrates) and exercise (at least 30 minutes of moderate  physical activity daily).  Written and verbal instructions provided   The above assessment and management plan was discussed with the patient. The patient verbalized understanding of and has agreed to the management plan. Patient is aware to call the clinic if they develop any new symptoms or if symptoms persist or worsen. Patient is aware when to return to the clinic for a follow-up visit. Patient educated on when it is appropriate to go to the emergency department.   Neale Burly, DNP-FNP [facility] [address] [place], [place] [address] [phone]

## 2023-09-19 LAB — HEAVY METALS PROFILE II, BLOOD
Arsenic: 2 ug/L (ref 0–9)
Cadmium: <0.5 ug/L (ref 0.0–1.2)
Lead, Blood: <1 ug/dL (ref 0.0–3.4)
Mercury: <1 ug/L (ref 0.0–14.9)

## 2023-09-19 LAB — COPPER, SERUM: Copper: 122 ug/dL (ref 80–158)

## 2023-09-19 NOTE — Progress Notes (Signed)
Labs normal.

## 2023-10-07 ENCOUNTER — Ambulatory Visit (HOSPITAL_COMMUNITY): Payer: 59 | Attending: Cardiology

## 2023-10-07 DIAGNOSIS — I209 Angina pectoris, unspecified: Secondary | ICD-10-CM | POA: Diagnosis not present

## 2023-10-07 DIAGNOSIS — I5032 Chronic diastolic (congestive) heart failure: Secondary | ICD-10-CM | POA: Diagnosis not present

## 2023-10-07 LAB — MYOCARDIAL PERFUSION IMAGING
LV dias vol: 68 mL (ref 46–106)
LV sys vol: 31 mL
Nuc Stress EF: 54 %
Peak HR: 78 {beats}/min
Rest HR: 58 {beats}/min
Rest Nuclear Isotope Dose: 10.2 mCi
SDS: 1
SRS: 0
SSS: 1
ST Depression (mm): 0 mm
Stress Nuclear Isotope Dose: 31.3 mCi
TID: 0.9

## 2023-10-07 MED ORDER — REGADENOSON 0.4 MG/5ML IV SOLN
0.4000 mg | Freq: Once | INTRAVENOUS | Status: AC
  Administered 2023-10-07: 0.4 mg via INTRAVENOUS

## 2023-10-07 MED ORDER — TECHNETIUM TC 99M TETROFOSMIN IV KIT
31.3000 | PACK | Freq: Once | INTRAVENOUS | Status: AC | PRN
  Administered 2023-10-07: 31.3 via INTRAVENOUS

## 2023-10-07 MED ORDER — TECHNETIUM TC 99M TETROFOSMIN IV KIT
10.2000 | PACK | Freq: Once | INTRAVENOUS | Status: AC | PRN
  Administered 2023-10-07: 10.2 via INTRAVENOUS

## 2023-10-16 DIAGNOSIS — M79676 Pain in unspecified toe(s): Secondary | ICD-10-CM | POA: Diagnosis not present

## 2023-10-16 DIAGNOSIS — E1142 Type 2 diabetes mellitus with diabetic polyneuropathy: Secondary | ICD-10-CM | POA: Diagnosis not present

## 2023-10-16 DIAGNOSIS — L603 Nail dystrophy: Secondary | ICD-10-CM | POA: Diagnosis not present

## 2023-10-16 DIAGNOSIS — L84 Corns and callosities: Secondary | ICD-10-CM | POA: Diagnosis not present

## 2023-10-18 ENCOUNTER — Observation Stay (HOSPITAL_COMMUNITY)
Admission: EM | Admit: 2023-10-18 | Discharge: 2023-10-19 | Disposition: A | Discharge status: Home / Self Care | Payer: 59 | Attending: Internal Medicine | Admitting: Internal Medicine

## 2023-10-18 ENCOUNTER — Other Ambulatory Visit: Payer: Self-pay | Admitting: Nurse Practitioner

## 2023-10-18 ENCOUNTER — Emergency Department (HOSPITAL_COMMUNITY): Payer: 59

## 2023-10-18 ENCOUNTER — Other Ambulatory Visit: Payer: Self-pay

## 2023-10-18 ENCOUNTER — Encounter (HOSPITAL_COMMUNITY): Payer: Self-pay | Admitting: Family Medicine

## 2023-10-18 DIAGNOSIS — Z7982 Long term (current) use of aspirin: Secondary | ICD-10-CM | POA: Insufficient documentation

## 2023-10-18 DIAGNOSIS — Z85828 Personal history of other malignant neoplasm of skin: Secondary | ICD-10-CM | POA: Insufficient documentation

## 2023-10-18 DIAGNOSIS — E119 Type 2 diabetes mellitus without complications: Secondary | ICD-10-CM

## 2023-10-18 DIAGNOSIS — M25552 Pain in left hip: Secondary | ICD-10-CM | POA: Diagnosis not present

## 2023-10-18 DIAGNOSIS — N1832 Chronic kidney disease, stage 3b: Secondary | ICD-10-CM | POA: Diagnosis not present

## 2023-10-18 DIAGNOSIS — I6350 Cerebral infarction due to unspecified occlusion or stenosis of unspecified cerebral artery: Secondary | ICD-10-CM | POA: Diagnosis present

## 2023-10-18 DIAGNOSIS — Z91040 Latex allergy status: Secondary | ICD-10-CM | POA: Insufficient documentation

## 2023-10-18 DIAGNOSIS — R2681 Unsteadiness on feet: Secondary | ICD-10-CM | POA: Diagnosis not present

## 2023-10-18 DIAGNOSIS — I10 Essential (primary) hypertension: Secondary | ICD-10-CM | POA: Diagnosis present

## 2023-10-18 DIAGNOSIS — I5032 Chronic diastolic (congestive) heart failure: Secondary | ICD-10-CM | POA: Insufficient documentation

## 2023-10-18 DIAGNOSIS — I6381 Other cerebral infarction due to occlusion or stenosis of small artery: Secondary | ICD-10-CM | POA: Diagnosis not present

## 2023-10-18 DIAGNOSIS — N1831 Chronic kidney disease, stage 3a: Secondary | ICD-10-CM | POA: Diagnosis present

## 2023-10-18 DIAGNOSIS — Z8673 Personal history of transient ischemic attack (TIA), and cerebral infarction without residual deficits: Secondary | ICD-10-CM | POA: Diagnosis not present

## 2023-10-18 DIAGNOSIS — R29818 Other symptoms and signs involving the nervous system: Secondary | ICD-10-CM | POA: Diagnosis not present

## 2023-10-18 DIAGNOSIS — R531 Weakness: Secondary | ICD-10-CM | POA: Diagnosis not present

## 2023-10-18 DIAGNOSIS — R2689 Other abnormalities of gait and mobility: Secondary | ICD-10-CM | POA: Insufficient documentation

## 2023-10-18 DIAGNOSIS — M6281 Muscle weakness (generalized): Secondary | ICD-10-CM | POA: Insufficient documentation

## 2023-10-18 DIAGNOSIS — R739 Hyperglycemia, unspecified: Secondary | ICD-10-CM | POA: Diagnosis not present

## 2023-10-18 DIAGNOSIS — E1169 Type 2 diabetes mellitus with other specified complication: Secondary | ICD-10-CM | POA: Diagnosis not present

## 2023-10-18 DIAGNOSIS — I2510 Atherosclerotic heart disease of native coronary artery without angina pectoris: Secondary | ICD-10-CM | POA: Diagnosis not present

## 2023-10-18 DIAGNOSIS — E1165 Type 2 diabetes mellitus with hyperglycemia: Secondary | ICD-10-CM | POA: Insufficient documentation

## 2023-10-18 DIAGNOSIS — E782 Mixed hyperlipidemia: Secondary | ICD-10-CM | POA: Diagnosis present

## 2023-10-18 DIAGNOSIS — E1142 Type 2 diabetes mellitus with diabetic polyneuropathy: Secondary | ICD-10-CM

## 2023-10-18 DIAGNOSIS — R6889 Other general symptoms and signs: Secondary | ICD-10-CM | POA: Diagnosis not present

## 2023-10-18 DIAGNOSIS — E1122 Type 2 diabetes mellitus with diabetic chronic kidney disease: Secondary | ICD-10-CM | POA: Insufficient documentation

## 2023-10-18 DIAGNOSIS — Z7722 Contact with and (suspected) exposure to environmental tobacco smoke (acute) (chronic): Secondary | ICD-10-CM | POA: Insufficient documentation

## 2023-10-18 DIAGNOSIS — Z79899 Other long term (current) drug therapy: Secondary | ICD-10-CM | POA: Insufficient documentation

## 2023-10-18 DIAGNOSIS — I639 Cerebral infarction, unspecified: Secondary | ICD-10-CM | POA: Diagnosis not present

## 2023-10-18 DIAGNOSIS — I130 Hypertensive heart and chronic kidney disease with heart failure and stage 1 through stage 4 chronic kidney disease, or unspecified chronic kidney disease: Secondary | ICD-10-CM | POA: Diagnosis not present

## 2023-10-18 DIAGNOSIS — Z794 Long term (current) use of insulin: Secondary | ICD-10-CM

## 2023-10-18 DIAGNOSIS — M47816 Spondylosis without myelopathy or radiculopathy, lumbar region: Secondary | ICD-10-CM | POA: Diagnosis not present

## 2023-10-18 DIAGNOSIS — E785 Hyperlipidemia, unspecified: Secondary | ICD-10-CM | POA: Diagnosis not present

## 2023-10-18 DIAGNOSIS — Z01818 Encounter for other preprocedural examination: Secondary | ICD-10-CM | POA: Diagnosis not present

## 2023-10-18 DIAGNOSIS — R0689 Other abnormalities of breathing: Secondary | ICD-10-CM | POA: Diagnosis not present

## 2023-10-18 DIAGNOSIS — I959 Hypotension, unspecified: Secondary | ICD-10-CM | POA: Diagnosis not present

## 2023-10-18 DIAGNOSIS — I499 Cardiac arrhythmia, unspecified: Secondary | ICD-10-CM | POA: Diagnosis not present

## 2023-10-18 DIAGNOSIS — Z743 Need for continuous supervision: Secondary | ICD-10-CM | POA: Diagnosis not present

## 2023-10-18 LAB — COMPREHENSIVE METABOLIC PANEL
ALT: 10 U/L (ref 0–44)
AST: 16 U/L (ref 15–41)
Albumin: 3.6 g/dL (ref 3.5–5.0)
Alkaline Phosphatase: 58 U/L (ref 38–126)
Anion gap: 9 (ref 5–15)
BUN: 21 mg/dL (ref 8–23)
CO2: 24 mmol/L (ref 22–32)
Calcium: 10.1 mg/dL (ref 8.9–10.3)
Chloride: 104 mmol/L (ref 98–111)
Creatinine, Ser: 1.21 mg/dL — ABNORMAL HIGH (ref 0.44–1.00)
GFR, Estimated: 43 mL/min — ABNORMAL LOW (ref >60)
Glucose, Bld: 207 mg/dL — ABNORMAL HIGH (ref 70–99)
Potassium: 4.3 mmol/L (ref 3.5–5.1)
Sodium: 137 mmol/L (ref 135–145)
Total Bilirubin: 0.7 mg/dL (ref <1.2)
Total Protein: 7.1 g/dL (ref 6.5–8.1)

## 2023-10-18 LAB — CBC WITH DIFFERENTIAL/PLATELET
Abs Immature Granulocytes: 0.04 10*3/uL (ref 0.00–0.07)
Basophils Absolute: 0 10*3/uL (ref 0.0–0.1)
Basophils Relative: 0 %
Eosinophils Absolute: 0.2 10*3/uL (ref 0.0–0.5)
Eosinophils Relative: 2 %
HCT: 37.8 % (ref 36.0–46.0)
Hemoglobin: 12.6 g/dL (ref 12.0–15.0)
Immature Granulocytes: 0 %
Lymphocytes Relative: 10 %
Lymphs Abs: 1.1 10*3/uL (ref 0.7–4.0)
MCH: 31.5 pg (ref 26.0–34.0)
MCHC: 33.3 g/dL (ref 30.0–36.0)
MCV: 94.5 fL (ref 80.0–100.0)
Monocytes Absolute: 0.8 10*3/uL (ref 0.1–1.0)
Monocytes Relative: 8 %
Neutro Abs: 8.5 10*3/uL — ABNORMAL HIGH (ref 1.7–7.7)
Neutrophils Relative %: 80 %
Platelets: 191 10*3/uL (ref 150–400)
RBC: 4 MIL/uL (ref 3.87–5.11)
RDW: 13.2 % (ref 11.5–15.5)
WBC: 10.7 10*3/uL — ABNORMAL HIGH (ref 4.0–10.5)
nRBC: 0 % (ref 0.0–0.2)

## 2023-10-18 LAB — GLUCOSE, CAPILLARY: Glucose-Capillary: 138 mg/dL — ABNORMAL HIGH (ref 70–99)

## 2023-10-18 MED ORDER — ACETAMINOPHEN 160 MG/5ML PO SOLN: 650.0000 mg | ORAL | Status: DC | PRN

## 2023-10-18 MED ORDER — ENOXAPARIN SODIUM 40 MG/0.4ML IJ SOSY: 40.0000 mg | PREFILLED_SYRINGE | INTRAMUSCULAR | Status: DC

## 2023-10-18 MED ORDER — HYDRALAZINE HCL 20 MG/ML IJ SOLN: 5.0000 mg | INTRAMUSCULAR | Status: DC | PRN

## 2023-10-18 MED ORDER — FESOTERODINE FUMARATE ER 4 MG PO TB24
4.0000 mg | ORAL_TABLET | Freq: Every day | ORAL | Status: DC
  Administered 2023-10-19: 4 mg via ORAL
  Filled 2023-10-18: qty 1

## 2023-10-18 MED ORDER — STROKE: EARLY STAGES OF RECOVERY BOOK: Freq: Once | Status: AC

## 2023-10-18 MED ORDER — ACETAMINOPHEN 325 MG PO TABS: 650.0000 mg | ORAL_TABLET | ORAL | Status: DC | PRN

## 2023-10-18 MED ORDER — ATORVASTATIN CALCIUM 10 MG PO TABS
10.0000 mg | ORAL_TABLET | Freq: Every day | ORAL | Status: DC
  Filled 2023-10-18: qty 1

## 2023-10-18 MED ORDER — PANTOPRAZOLE SODIUM 40 MG PO TBEC
40.0000 mg | DELAYED_RELEASE_TABLET | Freq: Every day | ORAL | Status: DC
  Administered 2023-10-18 – 2023-10-19 (×2): 40 mg via ORAL
  Filled 2023-10-18 (×2): qty 1

## 2023-10-18 MED ORDER — INSULIN GLARGINE-YFGN 100 UNIT/ML ~~LOC~~ SOLN
40.0000 [IU] | Freq: Every day | SUBCUTANEOUS | Status: DC
  Administered 2023-10-18: 40 [IU] via SUBCUTANEOUS
  Filled 2023-10-18 (×2): qty 0.4

## 2023-10-18 MED ORDER — ENOXAPARIN SODIUM 30 MG/0.3ML IJ SOSY
30.0000 mg | PREFILLED_SYRINGE | INTRAMUSCULAR | Status: DC
  Administered 2023-10-18: 30 mg via SUBCUTANEOUS
  Filled 2023-10-18: qty 0.3

## 2023-10-18 MED ORDER — LORAZEPAM 0.5 MG PO TABS
0.5000 mg | ORAL_TABLET | Freq: Two times a day (BID) | ORAL | Status: DC
  Administered 2023-10-18 – 2023-10-19 (×2): 0.5 mg via ORAL
  Filled 2023-10-18 (×2): qty 1

## 2023-10-18 MED ORDER — CITALOPRAM HYDROBROMIDE 20 MG PO TABS
20.0000 mg | ORAL_TABLET | Freq: Every day | ORAL | Status: DC
  Administered 2023-10-19: 20 mg via ORAL
  Filled 2023-10-18: qty 1

## 2023-10-18 MED ORDER — FLUTICASONE PROPIONATE 50 MCG/ACT NA SUSP
2.0000 | Freq: Every day | NASAL | Status: DC
  Administered 2023-10-19: 2 via NASAL
  Filled 2023-10-18: qty 16

## 2023-10-18 MED ORDER — FUROSEMIDE 20 MG PO TABS
20.0000 mg | ORAL_TABLET | Freq: Every day | ORAL | Status: DC
  Administered 2023-10-19: 20 mg via ORAL
  Filled 2023-10-18: qty 1

## 2023-10-18 MED ORDER — ACETAMINOPHEN 650 MG RE SUPP: 650.0000 mg | RECTAL | Status: DC | PRN

## 2023-10-18 MED ORDER — ASPIRIN 81 MG PO CHEW
81.0000 mg | CHEWABLE_TABLET | Freq: Every day | ORAL | Status: DC
  Administered 2023-10-18 – 2023-10-19 (×2): 81 mg via ORAL
  Filled 2023-10-18 (×2): qty 1

## 2023-10-18 MED ORDER — SENNOSIDES-DOCUSATE SODIUM 8.6-50 MG PO TABS: 1.0000 | ORAL_TABLET | Freq: Every evening | ORAL | Status: DC | PRN

## 2023-10-18 MED ORDER — INSULIN ASPART 100 UNIT/ML IJ SOLN
0.0000 [IU] | Freq: Every day | INTRAMUSCULAR | Status: DC
Start: 2023-10-18 — End: 2023-10-19

## 2023-10-18 MED ORDER — INSULIN ASPART 100 UNIT/ML IJ SOLN
0.0000 [IU] | Freq: Three times a day (TID) | INTRAMUSCULAR | Status: DC
Start: 2023-10-19 — End: 2023-10-19

## 2023-10-18 NOTE — ED Provider Notes (Signed)
[place] EMERGENCY DEPARTMENT AT [facility] Provider Note  CSN: [id number] Arrival date & time: 10/18/23 1444  Chief Complaint(s) Hip Pain  HPI Kara Dean is a 89 y.o. female with PMH HTN, CHF, previous CVA/TIA with no residual deficits, T2DM, HLD who presents to the emergency room for evaluation of left-sided weakness.  Daughter last saw the patient normal 48 hours ago on 10/16/2023.  Noticed yesterday morning that she was having significant amount of difficulty walking and was dragging the left leg.  There was some report yesterday that she was having groin pain on the left but here in the emergency room today patient is not complaining of any pain including no chest pain, shortness of breath, abdominal pain, nausea, vomiting, pain in the groin, dysuria or other systemic symptoms.   Past Medical History Past Medical History:  Diagnosis Date   Anxiety    Carotid artery disease (HCC)    Carotid US 11/2018:  bilateral ICA 1-39 >> repeat 2 years.    Cataract    Crohn's colitis (HCC)    CVA (cerebral infarction)    No deficits   Depression    Diabetes mellitus    type 2   Dyslipidemia    GERD (gastroesophageal reflux disease)    History of TIAs    no deficits   Hyperlipidemia    Hypertension    Hyponatremia    Nodular basal cell carcinoma (BCC) 04/22/2018   left prox, nasal root (tx p bx)   Osteoarthritis    SCC (squamous cell carcinoma) 04/22/2018   left shin,superior-(tx p bx), Left shin,inferior-(tx p bx )   Patient Active Problem List   Diagnosis Date Noted   Choledocholithiasis with acute cholecystitis 05/07/2021   Peripheral edema 03/18/2017   Chronic kidney disease (CKD) stage G3a/A3, moderately decreased glomerular filtration rate (GFR) between 45-59 mL/min/1.73 square meter and albuminuria creatinine ratio greater than 300 mg/g (HCC) 07/27/2014   Gastroesophageal reflux disease without esophagitis 04/27/2014   GAD (generalized anxiety disorder)  04/27/2014   Late effect of cerebrovascular accident (CVA) 03/26/2010   Hyperlipidemia associated with type 2 diabetes mellitus (HCC) 02/01/2009   Depression 02/01/2009   Essential hypertension 02/01/2009   Osteoarthritis 02/01/2009   Diabetes mellitus with insulin therapy (HCC) 02/01/2009   Home Medication(s) Prior to Admission medications   Medication Sig Start Date End Date Taking? Authorizing Provider  amLODipine (NORVASC) 10 MG tablet Take 1 tablet (10 mg total) by mouth daily. 09/05/23   Daphine Deutscher, Mary-Margaret, FNP  atorvastatin (LIPITOR) 10 MG tablet Take 1 tablet (10 mg total) by mouth daily. (NEEDS TO BE SEEN BEFORE NEXT REFILL) 09/05/23   Bennie Pierini, FNP  B Complex Vitamins (B COMPLEX-B12) TABS Take 1 tablet by mouth daily.    [provider]  Cholecalciferol (VITAMIN D3 PO) Take 1 capsule by mouth daily.    [provider]  citalopram (CELEXA) 20 MG tablet Take 1 tablet (20 mg total) by mouth daily. 09/05/23   Daphine Deutscher, Mary-Margaret, FNP  dipyridamole-aspirin (AGGRENOX) 200-25 MG 12hr capsule Take 1 capsule by mouth daily. 09/05/23   Daphine Deutscher Mary-Margaret, FNP  fluticasone (FLONASE) 50 MCG/ACT nasal spray USE 2 SPRAYS IN EACH NOSTRIL ONCE DAILY 03/20/23   Daphine Deutscher, Mary-Margaret, FNP  furosemide (LASIX) 20 MG tablet Take 1 tablet (20 mg total) by mouth daily. 09/05/23   Daphine Deutscher, Mary-Margaret, FNP  glucose blood (ONETOUCH ULTRA) test strip CHECK BLOOD SUGER UP TO 3 TIMES A DAY 12/25/21   Daphine Deutscher, Mary-Margaret, FNP  Insulin Pen Needle (  PX EXTRA SHORT PEN NEEDLES) 31G X 6 MM MISC Use as directed 12/10/22   Daphine Deutscher, Mary-Margaret, FNP  LANTUS SOLOSTAR 100 UNIT/ML Solostar Pen Inject 40 Units into the skin daily. 09/05/23   Daphine Deutscher, Mary-Margaret, FNP  LORazepam (ATIVAN) 0.5 MG tablet Take 1 tablet (0.5 mg total) by mouth 2 (two) times daily. 09/05/23   Daphine Deutscher, Mary-Margaret, FNP  Magnesium 250 MG TABS Take 250 mg by mouth daily.    [provider]  metoprolol  succinate (TOPROL-XL) 25 MG 24 hr tablet Take 1 tablet (25 mg total) by mouth daily. 09/05/23   Daphine Deutscher, Mary-Margaret, FNP  olmesartan (BENICAR) 40 MG tablet Take 1 tablet (40 mg total) by mouth daily. 09/05/23   Daphine Deutscher Mary-Margaret, FNP  Omega-3 Fatty Acids (FISH OIL) 1000 MG CAPS Take 1 capsule by mouth daily.    [provider]  pantoprazole (PROTONIX) 40 MG tablet Take 1 tablet (40 mg total) by mouth daily. (NEEDS TO BE SEEN BEFORE NEXT REFILL) 09/05/23   Daphine Deutscher, Mary-Margaret, FNP  potassium chloride (KLOR-CON) 10 MEQ tablet Take 1 tablet (10 mEq total) by mouth as directed. Mon, Wed, and Fri only. 09/05/23   Daphine Deutscher, Mary-Margaret, FNP  tolterodine (DETROL LA) 4 MG 24 hr capsule Take 1 capsule (4 mg total) by mouth daily. 09/05/23   Bennie Pierini, FNP                                                                                                                                    Past Surgical History Past Surgical History:  Procedure Laterality Date   ABDOMINAL HYSTERECTOMY     BACK SURGERY  1994   CATARACT EXTRACTION W/PHACO Left 09/19/2017   Procedure: CATARACT EXTRACTION PHACO AND INTRAOCULAR LENS PLACEMENT (IOC);  Surgeon: Fabio Pierce, MD;  Location: [facility];  Service: Ophthalmology;  Laterality: Left;  CDE: 8.10   CATARACT EXTRACTION W/PHACO Right 10/10/2017   Procedure: CATARACT EXTRACTION PHACO AND INTRAOCULAR LENS PLACEMENT RIGHT EYE;  Surgeon: Fabio Pierce, MD;  Location: [facility];  Service: Ophthalmology;  Laterality: Right;  CDE: 9.60   CHOLECYSTECTOMY N/A 05/10/2021   Procedure: LAPAROSCOPIC CHOLECYSTECTOMY;  Surgeon: Berna Bue, MD;  Location: [facility] OR;  Service: General;  Laterality: N/A;   COLON RESECTION  1990's   TUBAL LIGATION     VESICOVAGINAL FISTULA CLOSURE W/ TAH  1984   Family History Family History  Problem Relation Age of Onset   Coronary artery disease Sister        stent x 3   Seizures Sister    Coronary artery disease Brother         stent   Hypertension Mother    Stroke Mother    Emphysema Father    Alcohol abuse Brother    Lung disease Brother    Heart attack Daughter     Social History Social History   Tobacco Use   Smoking status: Never  Passive exposure: Current   Smokeless tobacco: Never  Vaping Use   Vaping status: Never Used  Substance Use Topics   Alcohol use: No   Drug use: No   Allergies Codeine, Latex, Morphine, Niacin, Sulfa antibiotics, and Sulfonamide derivatives  Review of Systems Review of Systems  Neurological:  Positive for weakness.    Physical Exam Vital Signs  I have reviewed the triage vital signs BP (!) 187/52   Pulse (!) 59   Temp 98.7 F (37.1 C) (Oral)   Resp 18   Ht 5' (1.524 m)   Wt 56.2 kg   SpO2 92%   BMI 24.22 kg/m   Physical Exam Vitals and nursing note reviewed.  Constitutional:      General: She is not in acute distress.    Appearance: She is well-developed.  HENT:     Head: Normocephalic and atraumatic.  Eyes:     Conjunctiva/sclera: Conjunctivae normal.  Cardiovascular:     Rate and Rhythm: Normal rate and regular rhythm.     Heart sounds: No murmur heard. Pulmonary:     Effort: Pulmonary effort is normal. No respiratory distress.     Breath sounds: Normal breath sounds.  Abdominal:     Palpations: Abdomen is soft.     Tenderness: There is no abdominal tenderness.  Musculoskeletal:        General: No swelling.     Cervical back: Neck supple.  Skin:    General: Skin is warm and dry.     Capillary Refill: Capillary refill takes less than 2 seconds.  Neurological:     Mental Status: She is alert.     Motor: Weakness present.  Psychiatric:        Mood and Affect: Mood normal.     ED Results and Treatments Labs (all labs ordered are listed, but only abnormal results are displayed) Labs Reviewed  CBC WITH DIFFERENTIAL/PLATELET - Abnormal; Notable for the following components:      Result Value   WBC 10.7 (*)    Neutro Abs 8.5 (*)     All other components within normal limits  COMPREHENSIVE METABOLIC PANEL  URINALYSIS, ROUTINE W REFLEX MICROSCOPIC                                                                                                                          Radiology No results found.  Pertinent labs & imaging results that were available during my care of the patient were reviewed by me and considered in my medical decision making (see MDM for details).  Medications Ordered in ED Medications - No data to display  Procedures Procedures  (including critical care time)  Medical Decision Making / ED Course   This patient presents to the ED for concern of weakness, this involves an extensive number of treatment options, and is a complaint that carries with it a high risk of complications and morbidity.  The differential diagnosis includes CVA, hemorrhagic stroke, mass, Todd's paralysis, seizure, electrolyte abnormality, encephalopathy, complicated migraine  MDM: Patient seen emergency room for evaluation of weakness.  Physical exam with left upper and left lower extremity weakness.  No cranial nerve deficits on exam.  Laboratory evaluation with leukocytosis to 10.7, creatinine 1.21 but is otherwise unremarkable.  Initial CT head with age-indeterminate perforator infarcts in bilateral thalamic eye and central pons that are new.  Hip x-ray negative for any traumatic injury.  Follow-up MRI with an acute perforator infarct in the right pons, moderate spinal canal stenosis at C4-C5.  Unfortunately, patient outside the window for TNK or thrombectomy.  I spoke with the neurologist on-call Dr. Wilford Corner who is recommending medical admission for completion of stroke workup and he will assist the inpatient team at [facility] with recommendations over the phone tomorrow as bed availability does not allow  [place] to admit the patient [facility] right now.  Patient then admitted   Additional history obtained: -Additional history obtained from multiple family members -External records from outside source obtained and reviewed including: Chart review including previous notes, labs, imaging, consultation notes   Lab Tests: -I ordered, reviewed, and interpreted labs.   The pertinent results include:   Labs Reviewed  CBC WITH DIFFERENTIAL/PLATELET - Abnormal; Notable for the following components:      Result Value   WBC 10.7 (*)    Neutro Abs 8.5 (*)    All other components within normal limits  COMPREHENSIVE METABOLIC PANEL  URINALYSIS, ROUTINE W REFLEX MICROSCOPIC     Imaging Studies ordered: I ordered imaging studies including CT head, MRI brain, hip x-ray, chest x-ray I independently visualized and interpreted imaging. I agree with the radiologist interpretation   Medicines ordered and prescription drug management: No orders of the defined types were placed in this encounter.   -I have reviewed the patients home medicines and have made adjustments as needed  Critical interventions none  Consultations Obtained: I requested consultation with the neurologist on-call Dr. Wilford Corner,  and discussed lab and imaging findings as well as pertinent plan - they recommend: Medical admission for completion of stroke workup   Cardiac Monitoring: The patient was maintained on a cardiac monitor.  I personally viewed and interpreted the cardiac monitored which showed an underlying rhythm of: NSR  Social Determinants of Health:  Factors impacting patients care include: none   Reevaluation: After the interventions noted above, I reevaluated the patient and found that they have :stayed the same  Co morbidities that complicate the patient evaluation  Past Medical History:  Diagnosis Date   Anxiety    Carotid artery disease (HCC)    Carotid US 11/2018:  bilateral ICA 1-39 >> repeat 2 years.     Cataract    Crohn's colitis (HCC)    CVA (cerebral infarction)    No deficits   Depression    Diabetes mellitus    type 2   Dyslipidemia    GERD (gastroesophageal reflux disease)    History of TIAs    no deficits   Hyperlipidemia    Hypertension    Hyponatremia    Nodular basal cell carcinoma (BCC) 04/22/2018   left prox, nasal root (  tx p bx)   Osteoarthritis    SCC (squamous cell carcinoma) 04/22/2018   left shin,superior-(tx p bx), Left shin,inferior-(tx p bx )      Dispostion: I considered admission for this patient, and patient will require hospital admission for new stroke     Final Clinical Impression(s) / ED Diagnoses Final diagnoses:  None     @PCDICTATION @    Glendora Score, MD 10/18/23 2231

## 2023-10-18 NOTE — ED Notes (Signed)
Attempted to call report, was told bed is not assigned.

## 2023-10-18 NOTE — Progress Notes (Signed)
PHARMACIST - PHYSICIAN COMMUNICATION  CONCERNING:  Enoxaparin (Lovenox) for DVT Prophylaxis    RECOMMENDATION: Patient was prescribed enoxaprin 40mg  q24 hours for VTE prophylaxis.   Filed Weights   10/18/23 1459  Weight: 56.2 kg (124 lb)    Body mass index is 24.22 kg/m.  Estimated Creatinine Clearance: 25.3 mL/min (A) (by C-G formula based on SCr of 1.21 mg/dL (H)).   Patient is candidate for enoxaparin 30mg  every 24 hours based on CrCl <52ml/min or Weight <45kg  DESCRIPTION: Pharmacy has adjusted enoxaparin dose per [facility] policy.  Patient is now receiving enoxaparin 30 mg every 24 hours    Foye Deer, PharmD Clinical Pharmacist  10/18/2023 6:48 PM

## 2023-10-18 NOTE — Plan of Care (Signed)
  Problem: Education: Goal: Knowledge of General Education information will improve Description: Including pain rating scale, medication(s)/side effects and non-pharmacologic comfort measures Outcome: Progressing   Problem: Health Behavior/Discharge Planning: Goal: Ability to manage health-related needs will improve Outcome: Progressing   Problem: Clinical Measurements: Goal: Ability to maintain clinical measurements within normal limits will improve Outcome: Progressing Goal: Will remain free from infection Outcome: Progressing Goal: Diagnostic test results will improve Outcome: Progressing Goal: Respiratory complications will improve Outcome: Progressing Goal: Cardiovascular complication will be avoided Outcome: Progressing   Problem: Activity: Goal: Risk for activity intolerance will decrease Outcome: Progressing   Problem: Nutrition: Goal: Adequate nutrition will be maintained Outcome: Progressing   Problem: Coping: Goal: Level of anxiety will decrease Outcome: Progressing   Problem: Elimination: Goal: Will not experience complications related to bowel motility Outcome: Progressing Goal: Will not experience complications related to urinary retention Outcome: Progressing   Problem: Pain Management: Goal: General experience of comfort will improve Outcome: Progressing   Problem: Safety: Goal: Ability to remain free from injury will improve Outcome: Progressing   Problem: Skin Integrity: Goal: Risk for impaired skin integrity will decrease Outcome: Progressing   Problem: Education: Goal: Ability to describe self-care measures that may prevent or decrease complications (Diabetes Survival Skills Education) will improve Outcome: Progressing Goal: Individualized Educational Video(s) Outcome: Progressing   Problem: Coping: Goal: Ability to adjust to condition or change in health will improve Outcome: Progressing   Problem: Fluid Volume: Goal: Ability to  maintain a balanced intake and output will improve Outcome: Progressing   Problem: Health Behavior/Discharge Planning: Goal: Ability to identify and utilize available resources and services will improve Outcome: Progressing Goal: Ability to manage health-related needs will improve Outcome: Progressing   Problem: Metabolic: Goal: Ability to maintain appropriate glucose levels will improve Outcome: Progressing   Problem: Nutritional: Goal: Maintenance of adequate nutrition will improve Outcome: Progressing Goal: Progress toward achieving an optimal weight will improve Outcome: Progressing   Problem: Skin Integrity: Goal: Risk for impaired skin integrity will decrease Outcome: Progressing   Problem: Tissue Perfusion: Goal: Adequacy of tissue perfusion will improve Outcome: Progressing   Problem: Education: Goal: Knowledge of disease or condition will improve Outcome: Progressing Goal: Knowledge of secondary prevention will improve (MUST DOCUMENT ALL) Outcome: Progressing Goal: Knowledge of patient specific risk factors will improve Loraine Leriche N/A or DELETE if not current risk factor) Outcome: Progressing   Problem: Ischemic Stroke/TIA Tissue Perfusion: Goal: Complications of ischemic stroke/TIA will be minimized Outcome: Progressing   Problem: Coping: Goal: Will verbalize positive feelings about self Outcome: Progressing Goal: Will identify appropriate support needs Outcome: Progressing   Problem: Health Behavior/Discharge Planning: Goal: Ability to manage health-related needs will improve Outcome: Progressing Goal: Goals will be collaboratively established with patient/family Outcome: Progressing   Problem: Self-Care: Goal: Ability to participate in self-care as condition permits will improve Outcome: Progressing Goal: Verbalization of feelings and concerns over difficulty with self-care will improve Outcome: Progressing Goal: Ability to communicate needs accurately  will improve Outcome: Progressing   Problem: Nutrition: Goal: Risk of aspiration will decrease Outcome: Progressing Goal: Dietary intake will improve Outcome: Progressing

## 2023-10-18 NOTE — H&P (Signed)
History and Physical    Patient: Kara Dean GNF:[id number] DOB: Sep 02, 1933 DOA: 10/18/2023 DOS: the patient was seen and examined on 10/18/2023 PCP: Bennie Pierini, FNP  Patient coming from: Home  Chief Complaint:  Chief Complaint  Patient presents with   Hip Pain   HPI: Kara Dean is a 89 y.o. female with medical history significant of coronary artery disease, history of CVA with no deficits, type 2 diabetes, history of TIAs, hypertension, hyperlipidemia.  Patient seen for left-sided weakness with deficits that started sometime over the last 1 to 2 days.  She was last seen well 48 hours ago.  She started noticing difficulty ambulating with left-sided arm and leg weakness.  Due to ongoing symptoms, she was brought to the hospital for evaluation.  Patient received CT and an MRI.  MRI showed right pontine stroke.  The EDP discussed patient with neurology.  No current beds at [facility], so neurology recommended that the patient be admitted here and they will give recommendations remotely.  Review of Systems: As mentioned in the history of present illness. All other systems reviewed and are negative. Past Medical History:  Diagnosis Date   Anxiety    Carotid artery disease (HCC)    Carotid US 11/2018:  bilateral ICA 1-39 >> repeat 2 years.    Cataract    Crohn's colitis (HCC)    CVA (cerebral infarction)    No deficits   Depression    Diabetes mellitus    type 2   Dyslipidemia    GERD (gastroesophageal reflux disease)    History of TIAs    no deficits   Hyperlipidemia    Hypertension    Hyponatremia    Nodular basal cell carcinoma (BCC) 04/22/2018   left prox, nasal root (tx p bx)   Osteoarthritis    SCC (squamous cell carcinoma) 04/22/2018   left shin,superior-(tx p bx), Left shin,inferior-(tx p bx )   Past Surgical History:  Procedure Laterality Date   ABDOMINAL HYSTERECTOMY     BACK SURGERY  1994   CATARACT EXTRACTION W/PHACO Left 09/19/2017    Procedure: CATARACT EXTRACTION PHACO AND INTRAOCULAR LENS PLACEMENT (IOC);  Surgeon: Fabio Pierce, MD;  Location: [facility];  Service: Ophthalmology;  Laterality: Left;  CDE: 8.10   CATARACT EXTRACTION W/PHACO Right 10/10/2017   Procedure: CATARACT EXTRACTION PHACO AND INTRAOCULAR LENS PLACEMENT RIGHT EYE;  Surgeon: Fabio Pierce, MD;  Location: [facility];  Service: Ophthalmology;  Laterality: Right;  CDE: 9.60   CHOLECYSTECTOMY N/A 05/10/2021   Procedure: LAPAROSCOPIC CHOLECYSTECTOMY;  Surgeon: Berna Bue, MD;  Location: [facility];  Service: General;  Laterality: N/A;   COLON RESECTION  1990's   TUBAL LIGATION     VESICOVAGINAL FISTULA CLOSURE W/ TAH  1984   Social History:  reports that she has never smoked. She has been exposed to tobacco smoke. She has never used smokeless tobacco. She reports that she does not drink alcohol and does not use drugs.  Allergies  Allergen Reactions   Codeine Other (See Comments)    unknown   Latex Rash   Morphine Other (See Comments)    unknown   Niacin Other (See Comments)    unknown   Sulfa Antibiotics Rash   Sulfonamide Derivatives Rash    Family History  Problem Relation Age of Onset   Coronary artery disease Sister        stent x 3   Seizures Sister    Coronary artery disease Brother  stent   Hypertension Mother    Stroke Mother    Emphysema Father    Alcohol abuse Brother    Lung disease Brother    Heart attack Daughter     Prior to Admission medications   Medication Sig Start Date End Date Taking? Authorizing Provider  amLODipine (NORVASC) 10 MG tablet Take 1 tablet (10 mg total) by mouth daily. 09/05/23   Daphine Deutscher, Mary-Margaret, FNP  atorvastatin (LIPITOR) 10 MG tablet Take 1 tablet (10 mg total) by mouth daily. (NEEDS TO BE SEEN BEFORE NEXT REFILL) 09/05/23   Bennie Pierini, FNP  B Complex Vitamins (B COMPLEX-B12) TABS Take 1 tablet by mouth daily.    [provider]  Cholecalciferol (VITAMIN D3 PO) Take 1 capsule  by mouth daily.    [provider]  citalopram (CELEXA) 20 MG tablet Take 1 tablet (20 mg total) by mouth daily. 09/05/23   Daphine Deutscher, Mary-Margaret, FNP  dipyridamole-aspirin (AGGRENOX) 200-25 MG 12hr capsule Take 1 capsule by mouth daily. 09/05/23   Daphine Deutscher Mary-Margaret, FNP  fluticasone (FLONASE) 50 MCG/ACT nasal spray USE 2 SPRAYS IN EACH NOSTRIL ONCE DAILY 03/20/23   Daphine Deutscher, Mary-Margaret, FNP  furosemide (LASIX) 20 MG tablet Take 1 tablet (20 mg total) by mouth daily. 09/05/23   Daphine Deutscher, Mary-Margaret, FNP  glucose blood (ONETOUCH ULTRA) test strip CHECK BLOOD SUGER UP TO 3 TIMES A DAY 12/25/21   Daphine Deutscher, Mary-Margaret, FNP  Insulin Pen Needle (PX EXTRA SHORT PEN NEEDLES) 31G X 6 MM MISC Use as directed 12/10/22   Daphine Deutscher, Mary-Margaret, FNP  LANTUS SOLOSTAR 100 UNIT/ML Solostar Pen Inject 40 Units into the skin daily. 09/05/23   Daphine Deutscher, Mary-Margaret, FNP  LORazepam (ATIVAN) 0.5 MG tablet Take 1 tablet (0.5 mg total) by mouth 2 (two) times daily. 09/05/23   Daphine Deutscher, Mary-Margaret, FNP  Magnesium 250 MG TABS Take 250 mg by mouth daily.    [provider]  metoprolol succinate (TOPROL-XL) 25 MG 24 hr tablet Take 1 tablet (25 mg total) by mouth daily. 09/05/23   Daphine Deutscher, Mary-Margaret, FNP  olmesartan (BENICAR) 40 MG tablet Take 1 tablet (40 mg total) by mouth daily. 09/05/23   Daphine Deutscher Mary-Margaret, FNP  Omega-3 Fatty Acids (FISH OIL) 1000 MG CAPS Take 1 capsule by mouth daily.    [provider]  pantoprazole (PROTONIX) 40 MG tablet Take 1 tablet (40 mg total) by mouth daily. (NEEDS TO BE SEEN BEFORE NEXT REFILL) 09/05/23   Daphine Deutscher, Mary-Margaret, FNP  potassium chloride (KLOR-CON) 10 MEQ tablet Take 1 tablet (10 mEq total) by mouth as directed. Mon, Wed, and Fri only. 09/05/23   Daphine Deutscher, Mary-Margaret, FNP  tolterodine (DETROL LA) 4 MG 24 hr capsule Take 1 capsule (4 mg total) by mouth daily. 09/05/23   Bennie Pierini, FNP    Physical Exam: Vitals:   10/18/23 1502 10/18/23 1530  10/18/23 1700 10/18/23 1730  BP:  (!) 191/62 (!) 209/64 136/85  Pulse:  (!) 56 (!) 58 60  Resp:  13 (!) 24 16  Temp: 98.7 F (37.1 C)     TempSrc: Oral     SpO2:  97% 96% 96%  Weight:      Height:       General: Elderly female. Awake and alert and oriented x3. No acute cardiopulmonary distress.  HEENT: Normocephalic atraumatic.  Right and left ears normal in appearance.  Pupils equal, round, reactive to light. Extraocular muscles are intact. Sclerae anicteric and noninjected.  Moist mucosal membranes. No mucosal lesions.  Neck: Neck supple without lymphadenopathy.  No carotid bruits. No masses palpated.  Cardiovascular: Regular rate with normal S1-S2 sounds. No murmurs, rubs, gallops auscultated. No JVD.  Respiratory: Good respiratory effort with no wheezes, rales, rhonchi. Lungs clear to auscultation bilaterally.  No accessory muscle use. Abdomen: Soft, nontender, nondistended. Active bowel sounds. No masses or hepatosplenomegaly  Skin: No rashes, lesions, or ulcerations.  Dry, warm to touch. 2+ dorsalis pedis and radial pulses. Musculoskeletal: No calf or leg pain. All major joints not erythematous nontender.  No upper or lower joint deformation.  Good ROM.  No contractures  Psychiatric: Intact judgment and insight. Pleasant and cooperative. Neurologic: Left-sided lower extremity weakness graded 3 out of 5.  She has normal strength on the right side.  Cranial nerves II through XII intact  Data Reviewed: Results for orders placed or performed during the hospital encounter of 10/18/23 (from the past 24 hours)  Comprehensive metabolic panel     Status: Abnormal   Collection Time: 10/18/23  3:15 PM  Result Value Ref Range   Sodium 137 135 - 145 mmol/L   Potassium 4.3 3.5 - 5.1 mmol/L   Chloride 104 98 - 111 mmol/L   CO2 24 22 - 32 mmol/L   Glucose, Bld 207 (H) 70 - 99 mg/dL   BUN 21 8 - 23 mg/dL   Creatinine, Ser 0.27 (H) 0.44 - 1.00 mg/dL   Calcium 25.3 8.9 - 66.4 mg/dL   Total  Protein 7.1 6.5 - 8.1 g/dL   Albumin 3.6 3.5 - 5.0 g/dL   AST 16 15 - 41 U/L   ALT 10 0 - 44 U/L   Alkaline Phosphatase 58 38 - 126 U/L   Total Bilirubin 0.7 <1.2 mg/dL   GFR, Estimated 43 (L) >60 mL/min   Anion gap 9 5 - 15  CBC with Differential     Status: Abnormal   Collection Time: 10/18/23  3:15 PM  Result Value Ref Range   WBC 10.7 (H) 4.0 - 10.5 K/uL   RBC 4.00 3.87 - 5.11 MIL/uL   Hemoglobin 12.6 12.0 - 15.0 g/dL   HCT 40.3 47.4 - 25.9 %   MCV 94.5 80.0 - 100.0 fL   MCH 31.5 26.0 - 34.0 pg   MCHC 33.3 30.0 - 36.0 g/dL   RDW 56.3 87.5 - 64.3 %   Platelets 191 150 - 400 K/uL   nRBC 0.0 0.0 - 0.2 %   Neutrophils Relative % 80 %   Neutro Abs 8.5 (H) 1.7 - 7.7 K/uL   Lymphocytes Relative 10 %   Lymphs Abs 1.1 0.7 - 4.0 K/uL   Monocytes Relative 8 %   Monocytes Absolute 0.8 0.1 - 1.0 K/uL   Eosinophils Relative 2 %   Eosinophils Absolute 0.2 0.0 - 0.5 K/uL   Basophils Relative 0 %   Basophils Absolute 0.0 0.0 - 0.1 K/uL   Immature Granulocytes 0 %   Abs Immature Granulocytes 0.04 0.00 - 0.07 K/uL    DG Chest Port 1 View Result Date: 10/18/2023 CLINICAL DATA:  [id number] Pre-op chest exam [id number]. EXAM: PORTABLE CHEST 1 VIEW COMPARISON:  Chest radiograph 08/05/2022. FINDINGS: Low lung volumes accentuate the pulmonary vasculature and cardiomediastinal silhouette. Basilar predominant reticular opacities, greater on the left, are favored to reflect age related fibrosis or scarring. No consolidation or pulmonary edema. No pleural effusion or pneumothorax. Visualized bones and upper abdomen are unremarkable. IMPRESSION: Low lung volumes with basilar predominant reticular opacities, greater on the left, favored to reflect age related fibrosis or scarring. Electronically  Signed   By: Orvan Falconer M.D.   On: 10/18/2023 16:31   DG Hip Unilat W or Wo Pelvis 2-3 Views Left Result Date: 10/18/2023 CLINICAL DATA:  Left hip pain.  Concern for fracture. EXAM: DG HIP (WITH OR WITHOUT PELVIS)  2-3V LEFT COMPARISON:  Pelvis and left hip radiographs 07/21/2018. FINDINGS: Three views of the left hip and pelvis. No acute fracture or dislocation of the left hip joint. Pelvic ring is intact. Degenerative changes of the lower lumbar spine and bilateral sacroiliac joints. Soft tissues are unremarkable. IMPRESSION: 1. No acute fracture or dislocation of the left hip joint. 2. Degenerative changes of the lower lumbar spine and bilateral sacroiliac joints. Electronically Signed   By: Orvan Falconer M.D.   On: 10/18/2023 16:29   MR BRAIN WO CONTRAST Result Date: 10/18/2023 CLINICAL DATA:  Neuro deficit, acute, stroke suspected LLE, LUE weakness. EXAM: MRI HEAD WITHOUT CONTRAST TECHNIQUE: Multiplanar, multiecho pulse sequences of the brain and surrounding structures were obtained without intravenous contrast. COMPARISON:  MRI brain 11/10/2010.  CT head 10/18/2023. FINDINGS: Brain: Acute perforator infarct in the right pons (axial image 16 series 5) just inferior to an old perforator infarct in the right pons (axial image 18 series 14). No acute hemorrhage or significant mass effect. Background of severe chronic small-vessel disease with old perforator infarcts in the bilateral thalami. No hydrocephalus or extra-axial collection. No mass or midline shift. Vascular: Normal flow voids. Skull and upper cervical spine: Normal marrow signal. Upper cervical spondylosis with at least moderate spinal canal stenosis at C4-5. Sinuses/Orbits: No acute findings. Other: None. IMPRESSION: 1. Acute perforator infarct in the right pons just inferior to an old perforator infarct in the right pons. No acute hemorrhage or significant mass effect. 2. Background of severe chronic small-vessel disease with old perforator infarcts in the bilateral thalami. 3. Upper cervical spondylosis with at least moderate spinal canal stenosis at C4-5. Electronically Signed   By: Orvan Falconer M.D.   On: 10/18/2023 16:27   CT Head Wo  Contrast Result Date: 10/18/2023 CLINICAL DATA:  Neuro deficit, acute, stroke suspected. Left foot drop. EXAM: CT HEAD WITHOUT CONTRAST TECHNIQUE: Contiguous axial images were obtained from the base of the skull through the vertex without intravenous contrast. RADIATION DOSE REDUCTION: This exam was performed according to the departmental dose-optimization program which includes automated exposure control, adjustment of the mA and/or kV according to patient size and/or use of iterative reconstruction technique. COMPARISON:  Head CT 11/09/2010.  MRI brain 11/10/2010. FINDINGS: Brain: No acute hemorrhage. Background of severe chronic small-vessel disease with age-indeterminate perforator infarcts in the bilateral thalami and central pons, new from the remote prior CT and MRI. Cortical gray-white differentiation is preserved. No hydrocephalus or extra-axial collection. No mass effect or midline shift. Vascular: No hyperdense vessel or unexpected calcification. Skull: No calvarial fracture or suspicious bone lesion. Skull base is unremarkable. Sinuses/Orbits: No acute finding. Other: None. IMPRESSION: 1. No acute hemorrhage. 2. Background of severe chronic small-vessel disease with age-indeterminate perforator infarcts in the bilateral thalami and central pons, new from the remote prior CT and MRI. Electronically Signed   By: Orvan Falconer M.D.   On: 10/18/2023 15:45     Assessment and Plan: No notes have been filed under this hospital service. Service: Hospitalist  Principal Problem:   Right pontine stroke [facility]) Active Problems:   Hyperlipidemia associated with type 2 diabetes mellitus (HCC)   Essential hypertension   Diabetes mellitus with insulin therapy (HCC)   Chronic kidney  disease (CKD) stage G3a/A3, moderately decreased glomerular filtration rate (GFR) between 45-59 mL/min/1.73 square meter and albuminuria creatinine ratio greater than 300 mg/g (HCC)  Principal Problem:   Right pontine  stroke (HCC) Active Problems:   Hyperlipidemia associated with type 2 diabetes mellitus (HCC)   Essential hypertension   Diabetes mellitus with insulin therapy (HCC)   Chronic kidney disease (CKD) stage G3a/A3, moderately decreased glomerular filtration rate (GFR) between 45-59 mL/min/1.73 square meter and albuminuria creatinine ratio greater than 300 mg/g (HCC)  Right pontine stroke telemetry CTA head/neck Echocardiogram tomorrow Hemoglobin A1c, lipid panel in the morning PT/OT/speech therapy consult ASA 81mg  I discussed patient with Dr. Jerrell Belfast -patient previously prescribed Aggrenox.  He recommended holding Plavix for the time being and just treating with baby aspirin.  After workup, he will make recommendations regarding Plavix. Diabetes Continue long-acting insulin with CBGs before meals and nightly. Sliding scale insulin Hypertension Permissive hypertension.  Will treat blood pressures with a systolic greater than or equal to 220 History of stroke CKD Hyperlipidemia Continue statin   Advance Care Planning:   Code Status: Full Code confirmed by patient  Consults: Neurology  Family Communication: Patient's daughters in the room  Severity of Illness: The appropriate patient status for this patient is INPATIENT. Inpatient status is judged to be reasonable and necessary in order to provide the required intensity of service to ensure the patient's safety. The patient's presenting symptoms, physical exam findings, and initial radiographic and laboratory data in the context of their chronic comorbidities is felt to place them at high risk for further clinical deterioration. Furthermore, it is not anticipated that the patient will be medically stable for discharge from the hospital within 2 midnights of admission.   * I certify that at the point of admission it is my clinical judgment that the patient will require inpatient hospital care spanning beyond 2 midnights from the point of  admission due to high intensity of service, high risk for further deterioration and high frequency of surveillance required.*  Author: Levie Heritage, DO 10/18/2023 5:47 PM  For on call review [email or web address].

## 2023-10-18 NOTE — ED Triage Notes (Signed)
Pt arrived [facility] from home with family. Pt c/o left hip pain and concerned with her left foot dragging when she walks . Pt ambulates with cane.

## 2023-10-18 NOTE — Progress Notes (Signed)
On Call Note  Called by Dr. Posey Rea - LKW Thursday AM sometime with left hemiparesis. MRI brain with right pontine acute stroke - likely small vessel etiology.  Recs: -Admit at [facility]  -Telemetry monitoring -Allow for permissive hypertension for the another 24h - only treat PRN if SBP >220 mmHg. Blood pressures can be gradually normalized to SBP<140 upon discharge. -CT Angiogram of Head and neck -Echocardiogram -HgbA1c, fasting lipid panel -Frequent neuro checks -Prophylactic therapy-Antiplatelet med: Aspirin 81 +   Plavix 75 mg PO daily -High intensity statin for LDL goal <70 -Risk factor modification -PT consult, OT consult, Speech consult -NPO until cleared by bedside swallow screen or formal swallow eval  I will be available for phone consult Sunday, should the hospitalist need me tomrorow. Please do not hesitate to call/securechat/page me.  Plan d/w Dr. Posey Rea   -- Milon Dikes, MD Neurologist [facility] [phone] - pager   Approximately 15 minutes of time were spent reviewing the case and discussion with the calling provider, more than 50% of which was spent in discussion.

## 2023-10-19 ENCOUNTER — Inpatient Hospital Stay (HOSPITAL_COMMUNITY): Payer: 59

## 2023-10-19 ENCOUNTER — Inpatient Hospital Stay (HOSPITAL_BASED_OUTPATIENT_CLINIC_OR_DEPARTMENT_OTHER): Payer: 59

## 2023-10-19 DIAGNOSIS — I639 Cerebral infarction, unspecified: Secondary | ICD-10-CM | POA: Diagnosis not present

## 2023-10-19 DIAGNOSIS — I10 Essential (primary) hypertension: Secondary | ICD-10-CM | POA: Diagnosis not present

## 2023-10-19 DIAGNOSIS — N1831 Chronic kidney disease, stage 3a: Secondary | ICD-10-CM | POA: Diagnosis not present

## 2023-10-19 DIAGNOSIS — R531 Weakness: Secondary | ICD-10-CM | POA: Diagnosis not present

## 2023-10-19 DIAGNOSIS — R9082 White matter disease, unspecified: Secondary | ICD-10-CM | POA: Diagnosis not present

## 2023-10-19 DIAGNOSIS — I6523 Occlusion and stenosis of bilateral carotid arteries: Secondary | ICD-10-CM | POA: Diagnosis not present

## 2023-10-19 DIAGNOSIS — I6350 Cerebral infarction due to unspecified occlusion or stenosis of unspecified cerebral artery: Secondary | ICD-10-CM | POA: Diagnosis not present

## 2023-10-19 DIAGNOSIS — I6389 Other cerebral infarction: Secondary | ICD-10-CM

## 2023-10-19 DIAGNOSIS — I6381 Other cerebral infarction due to occlusion or stenosis of small artery: Secondary | ICD-10-CM | POA: Diagnosis not present

## 2023-10-19 LAB — HEMOGLOBIN A1C
Hgb A1c MFr Bld: 7.3 % — ABNORMAL HIGH (ref 4.8–5.6)
Mean Plasma Glucose: 162.81 mg/dL

## 2023-10-19 LAB — LIPID PANEL
Cholesterol: 163 mg/dL (ref 0–200)
HDL: 45 mg/dL (ref >40)
LDL Cholesterol: 85 mg/dL (ref 0–99)
Total CHOL/HDL Ratio: 3.6 {ratio}
Triglycerides: 163 mg/dL — ABNORMAL HIGH (ref <150)
VLDL: 33 mg/dL (ref 0–40)

## 2023-10-19 LAB — GLUCOSE, CAPILLARY
Glucose-Capillary: 69 mg/dL — ABNORMAL LOW (ref 70–99)
Glucose-Capillary: 69 mg/dL — ABNORMAL LOW (ref 70–99)
Glucose-Capillary: 208 mg/dL — ABNORMAL HIGH (ref 70–99)
Glucose-Capillary: 196 mg/dL — ABNORMAL HIGH (ref 70–99)

## 2023-10-19 LAB — ECHOCARDIOGRAM COMPLETE
Area-P 1/2: 1.6 cm2
Height: 60 in
MV VTI: 1.37 cm2
P 1/2 time: 599 ms
S' Lateral: 2.5 cm
Weight: 1984 [oz_av]

## 2023-10-19 MED ORDER — ASPIRIN 81 MG PO CHEW: 81.0000 mg | CHEWABLE_TABLET | Freq: Every day | ORAL | Status: AC

## 2023-10-19 MED ORDER — INSULIN ASPART 100 UNIT/ML IJ SOLN: 0.0000 [IU] | Freq: Every day | INTRAMUSCULAR | Status: DC

## 2023-10-19 MED ORDER — ATORVASTATIN CALCIUM 20 MG PO TABS
20.0000 mg | ORAL_TABLET | Freq: Every day | ORAL | Status: DC
Start: 2023-10-20 — End: 2023-10-19

## 2023-10-19 MED ORDER — INSULIN ASPART 100 UNIT/ML IJ SOLN
0.0000 [IU] | Freq: Three times a day (TID) | INTRAMUSCULAR | Status: DC
  Administered 2023-10-19: 2 [IU] via SUBCUTANEOUS

## 2023-10-19 MED ORDER — ATORVASTATIN CALCIUM 20 MG PO TABS: 20.0000 mg | ORAL_TABLET | Freq: Every day | ORAL | 1 refills | Status: DC

## 2023-10-19 MED ORDER — INSULIN GLARGINE-YFGN 100 UNIT/ML ~~LOC~~ SOLN
25.0000 [IU] | Freq: Every day | SUBCUTANEOUS | Status: DC
Start: 2023-10-19 — End: 2023-10-19
  Filled 2023-10-19: qty 0.25

## 2023-10-19 MED ORDER — CLOPIDOGREL BISULFATE 75 MG PO TABS: 75.0000 mg | ORAL_TABLET | Freq: Every day | ORAL | 0 refills | Status: DC

## 2023-10-19 MED ORDER — DEXTROSE 50 % IV SOLN
0.5000 | Freq: Once | INTRAVENOUS | Status: AC
  Administered 2023-10-19: 25 mL via INTRAVENOUS
  Filled 2023-10-19: qty 50

## 2023-10-19 MED ORDER — IOHEXOL 350 MG/ML SOLN
60.0000 mL | Freq: Once | INTRAVENOUS | Status: AC | PRN
  Administered 2023-10-19: 60 mL via INTRAVENOUS

## 2023-10-19 MED ORDER — ATORVASTATIN CALCIUM 20 MG PO TABS: 20.0000 mg | ORAL_TABLET | Freq: Every day | ORAL | Status: DC

## 2023-10-19 MED ORDER — CLOPIDOGREL BISULFATE 75 MG PO TABS
75.0000 mg | ORAL_TABLET | Freq: Every day | ORAL | Status: DC
  Administered 2023-10-19: 75 mg via ORAL
  Filled 2023-10-19: qty 1

## 2023-10-19 MED ORDER — ATORVASTATIN CALCIUM 40 MG PO TABS
40.0000 mg | ORAL_TABLET | Freq: Every day | ORAL | Status: DC
  Administered 2023-10-19: 40 mg via ORAL
  Filled 2023-10-19: qty 1

## 2023-10-19 NOTE — Hospital Course (Addendum)
89 year old female with a history of hypertension, hyperlipidemia, CHF, CVA/TIA with no residual deficits, diabetes mellitus type 2, hyperlipidemia presenting with left-sided weakness started about 2 days prior to admission. daughter had stated that the last known normal was about 48 hours prior to admission around 10/16/2023.  On the morning of 10/17/2023, the patient was noted to have some difficulty with ambulation and dragging her left lower extremity.  There was some reported left upper extremity weakness. There was no visual disturbance or slurred speech or sensory disturbance. The patient denies any fevers, chills, chest pain, short of breath, nausea, vomiting, diarrhea, abdominal pain.  She denies any pain in the left groin.  In the ED, the patient was afebrile and hemodynamically stable with oxygen saturation 97% room air.  CT of the brain was unremarkable.  MRI of the brain showed no acute perforator infarct in the right pons.  There is moderate spinal canal stenosis C4-5.  The patient was not a thrombolytic candidate as she was outside the window.  Neurologist, Dr. Wilford Corner was contacted by EDP, and he recommended medical admission for completion of the stroke workup.  He recommended admission to [facility] with neurology to make recommendations virtually/remotely as there were no beds at [facility].

## 2023-10-19 NOTE — Discharge Summary (Addendum)
Physician Discharge Summary   Patient: Kara Dean MRN: [id number] DOB: 1933/10/05  Admit date:     10/18/2023  Discharge date: 10/19/23  Discharge Physician: Onalee Hua Lincoln Kleiner   PCP: Bennie Pierini, FNP   Recommendations at discharge:   Please follow up with primary care provider within 1-2 weeks  Please repeat BMP and CBC in one week     Hospital Course: 89 year old female with a history of hypertension, hyperlipidemia, CHF, CVA/TIA with no residual deficits, diabetes mellitus type 2, hyperlipidemia presenting with left-sided weakness started about 2 days prior to admission. daughter had stated that the last known normal was about 48 hours prior to admission around 10/16/2023.  On the morning of 10/17/2023, the patient was noted to have some difficulty with ambulation and dragging her left lower extremity.  There was some reported left upper extremity weakness. There was no visual disturbance or slurred speech or sensory disturbance. The patient denies any fevers, chills, chest pain, short of breath, nausea, vomiting, diarrhea, abdominal pain.  She denies any pain in the left groin.  In the ED, the patient was afebrile and hemodynamically stable with oxygen saturation 97% room air.  CT of the brain was unremarkable.  MRI of the brain showed no acute perforator infarct in the right pons.  There is moderate spinal canal stenosis C4-5.  The patient was not a thrombolytic candidate as she was outside the window.  Neurologist, Dr. Wilford Corner was contacted by EDP, and he recommended medical admission for completion of the stroke workup.  He recommended admission to [facility] with neurology to make recommendations virtually/remotely as there were no beds at [facility].  Assessment and Plan: Acute ischemic stroke -Appreciate Neurology Consult -PT/OT evaluation -Speech therapy eval -CT brain--negative for acute findings -MRI brain--acute infarct right pons; cervical spondylosis with moderate  canal stenosis L4-5 -CTA H+N--ICAs--no hemodynamically significant stenosis; no LVO -Echo--EF 60-65%, no WMA, no PFO -LDL--85 -HbA1C--7.3 -case discussed with neurology, Dr. Wilford Corner -Antiplatelet--ASA 81 mg + plavix 75 mg -request for 30 day heart monitor sent to cardiology -ambulatory referral sent for neurology follow up in 6 weeks -pt to follow up with cardiology in 3-4 weeks   Diabetes mellitus type 2 with hyperglycemia -09/05/2023 hemoglobin A1c 7.4 -Reduce dose Semglee -NovoLog sliding scale -Hemoglobin A1c 7.3   Essential hypertension -Holding metoprolol, olmesartan, and amlodipine to allow for permissive hypertension -restart meds after dc   CKD stage IIIb -Baseline creatinine 1.1-1.3 -Monitor BMP   Mixed hyperlipidemia -Continue statin -Increase Lipitor to 40 mg daily   HFpEF -Clinically euvolemic -12/19/2021 echo--EF 60 to 65%, no WMA, normal RVF, mild MR   Anxiety -PDMP reviewed -Ativan 0.5 mg, #60, last refill 09/17/2023 -Continue home dose Ativan           Consultants: neurology Procedures performed: none  Disposition: Home Diet recommendation:  Cardiac and Carb modified diet DISCHARGE MEDICATION: Allergies as of 10/19/2023       Reactions   Codeine Other (See Comments)   unknown   Latex Rash   Morphine Other (See Comments)   unknown   Niacin Other (See Comments)   unknown   Sulfa Antibiotics Rash   Sulfonamide Derivatives Rash        Medication List     STOP taking these medications    dipyridamole-aspirin 200-25 MG 12hr capsule Commonly known as: AGGRENOX       TAKE these medications    amLODipine 10 MG tablet Commonly known as: NORVASC Take 1 tablet (10 mg total) by  mouth daily.   aspirin 81 MG chewable tablet Chew 1 tablet (81 mg total) by mouth daily. Start taking on: October 20, 2023   atorvastatin 20 MG tablet Commonly known as: LIPITOR Take 1 tablet (20 mg total) by mouth daily. Start taking on: October 20, 2023 What changed:  medication strength how much to take additional instructions   citalopram 20 MG tablet Commonly known as: CeleXA Take 1 tablet (20 mg total) by mouth daily.   clopidogrel 75 MG tablet Commonly known as: PLAVIX Take 1 tablet (75 mg total) by mouth daily. Start taking on: October 20, 2023   fluticasone 50 MCG/ACT nasal spray Commonly known as: FLONASE USE 2 SPRAYS IN EACH NOSTRIL ONCE DAILY   furosemide 20 MG tablet Commonly known as: LASIX Take 1 tablet (20 mg total) by mouth daily.   Lantus SoloStar 100 UNIT/ML Solostar Pen Generic drug: insulin glargine Inject 40 Units into the skin daily.   LORazepam 0.5 MG tablet Commonly known as: ATIVAN Take 1 tablet (0.5 mg total) by mouth 2 (two) times daily.   metoprolol succinate 25 MG 24 hr tablet Commonly known as: TOPROL-XL Take 1 tablet (25 mg total) by mouth daily.   olmesartan 40 MG tablet Commonly known as: BENICAR Take 1 tablet (40 mg total) by mouth daily.   OneTouch Ultra test strip Generic drug: glucose blood CHECK BLOOD SUGER UP TO 3 TIMES A DAY   pantoprazole 40 MG tablet Commonly known as: PROTONIX Take 1 tablet (40 mg total) by mouth daily. (NEEDS TO BE SEEN BEFORE NEXT REFILL)   potassium chloride 10 MEQ tablet Commonly known as: KLOR-CON Take 1 tablet (10 mEq total) by mouth as directed. Mon, Wed, and Fri only.   PX Extra Short Pen Needles 31G X 6 MM Misc Generic drug: Insulin Pen Needle Use as directed   tolterodine 4 MG 24 hr capsule Commonly known as: Detrol LA Take 1 capsule (4 mg total) by mouth daily.        Follow-up Information     [facility], [facility] Follow up.   Specialty: Home Health Services Why: Agency will call to set up home visits. Contact information: [address] [place] [address] [phone]         Tonny Bollman, MD Follow up in 3 week(s).   Specialty: Cardiology Contact information: [address] [place] [address] [phone]                Discharge Exam: Ceasar Mons Weights   10/18/23 1459  Weight: 56.2 kg   HEENT:  [place]/AT, No thrush, no icterus CV:  RRR, no rub, no S3, no S4 Lung:  bibasilar crackles. No wheeze Abd:  soft/+BS, NT Ext:  No edema, no lymphangitis, no synovitis, no rash Neuro:  CN II-XII intact, strength 4/5 in RUE, RLE, strength 4/5 LUE, 4-/5LLE; sensation intact bilateral; no dysmetria; babinski equivocal  Condition at discharge: stable  The results of significant diagnostics from this hospitalization (including imaging, microbiology, ancillary and laboratory) are listed below for reference.   Imaging Studies: CT ANGIO HEAD NECK W WO CM Result Date: 10/19/2023 CLINICAL DATA:  Neuro deficit. Left upper and lower extremity weakness. Acute infarct in the right pons. EXAM: CT ANGIOGRAPHY HEAD AND NECK WITH AND WITHOUT CONTRAST TECHNIQUE: Multidetector CT imaging of the head and neck was performed using the standard protocol during bolus administration of intravenous contrast. Multiplanar CT image reconstructions and MIPs were obtained to evaluate the  vascular anatomy. Carotid stenosis measurements (when applicable) are obtained utilizing NASCET criteria, using the distal internal carotid diameter as the denominator. RADIATION DOSE REDUCTION: This exam was performed according to the departmental dose-optimization program which includes automated exposure control, adjustment of the mA and/or kV according to patient size and/or use of iterative reconstruction technique. CONTRAST:  60mL OMNIPAQUE IOHEXOL 350 MG/ML SOLN COMPARISON:  CT head without contrast and MR head without contrast 10/18/2023. FINDINGS: CT HEAD FINDINGS Brain: The right pontine infarct is noted without evidence for hemorrhage. Moderate generalized atrophy and white matter disease is otherwise stable. No other acute infarct is present. Deep brain nuclei are within normal limits. The ventricles are  of normal size. The remote lacunar infarct of the inferior left cerebellum is stable. Midline structures are within normal limits. Vascular: No hyperdense vessel or unexpected calcification. Skull: Calvarium is intact. No focal lytic or blastic lesions are present. No significant extracranial soft tissue lesion is present. Sinuses/Orbits: Bilateral lens replacements are noted. Globes and orbits are otherwise unremarkable. The paranasal sinuses and mastoid air cells are clear. Review of the MIP images confirms the above findings CTA NECK FINDINGS Aortic arch: Atherosclerotic calcifications present in the aortic arch at the origin of the left subclavian artery without significant stenosis or aneurysm. No dissection is present. A 4 vessel arch configuration is present. Right carotid system: The right common carotid artery is within normal limits. Minimal atherosclerotic calcifications are present at the carotid bifurcation. Mild tortuosity is present in the proximal right ICA without significant stenosis relative to the more distal vessel. Left carotid system: The left common carotid artery is within normal limits proximally. Minimal mural calcifications are present distally without significant stenosis. Calcifications are present at the left carotid bifurcation without significant stenosis. The ICA terminus is within normal limits. Vertebral arteries: The right vertebral artery is the dominant vessel. Left vertebral artery originates directly from the aortic arch. No significant stenosis is present in either vertebral artery in the neck. Skeleton: Degenerative changes are present in the cervical spine. Foraminal narrowing at C3-4 is secondary to facet hypertrophy. Foraminal narrowing at C4-5 C5-6 and C6-7 is predominantly due to uncovertebral spurring. Congenital fusion is present at C2-3. The patient is edentulous. No focal osseous lesions are present. Other neck: Soft tissues the neck are otherwise unremarkable.  Salivary glands are within normal limits. Thyroid is normal. No significant adenopathy is present. No focal mucosal or submucosal lesions are present. Upper chest: The lung apices are clear. The thoracic inlet is within normal limits. Review of the MIP images confirms the above findings CTA HEAD FINDINGS Anterior circulation: Atherosclerotic calcifications are present within the cavernous internal carotid arteries bilaterally without significant stenosis through the ICA termini. The A1 and M1 segments are normal. Anterior communicating artery is patent. The MCA bifurcations are normal bilaterally. The ACA and MCA branch vessels are normal. No aneurysm present. Posterior circulation: Atherosclerotic irregularity is present dural margin of both vertebral arteries without significant stenosis. PICA origins are visualized and normal. The vertebrobasilar junction and basilar artery are normal. The superior cerebellar arteries are patent. Both posterior cerebral arteries originate from basilar tip. Mild narrowing is present in the proximal right P2 segment. PCA branch vessels are otherwise normal bilaterally. Venous sinuses: The dural sinuses are patent. The straight sinus and deep cerebral veins are intact. Cortical veins are within normal limits. No significant vascular malformation is evident. Anatomic variants: None Review of the MIP images confirms the above findings IMPRESSION: 1. The right pontine infarct  is noted without evidence for hemorrhage. 2. Stable moderate generalized atrophy and white matter disease. This likely reflects the sequela of chronic microvascular ischemia. 3. Stable remote lacunar infarct of the inferior left cerebellum. 4. Mild narrowing of the proximal right P2 segment. 5. Atherosclerotic changes at the carotid bifurcations bilaterally and cavernous internal carotid arteries bilaterally without significant stenosis. 6. Degenerative changes of the cervical spine as described. 7.  Aortic  Atherosclerosis (ICD10-I70.0). Electronically Signed   By: Marin Roberts M.D.   On: 10/19/2023 12:51   ECHOCARDIOGRAM COMPLETE Result Date: 10/19/2023    ECHOCARDIOGRAM REPORT   Patient Name:   ELIZABETHANN OBANION Date of Exam: 10/19/2023 Medical Rec #:  [id number]      Height:       60.0 in Accession #:    [id number]     Weight:       124.0 lb Date of Birth:  Aug 06, 1933     BSA:          1.523 m Patient Age:    89 years       BP:           183/46 mmHg Patient Gender: F              HR:           63 bpm. Exam Location:  [facility] Procedure: 2D Echo, Color Doppler and Cardiac Doppler Indications:    Stroke I63.9  History:        Patient has prior history of Echocardiogram examinations, most                 recent 12/19/2021.  Sonographer:    Harriette Bouillon RDCS Referring Phys: [phone] [place] IMPRESSIONS  1. Left ventricular ejection fraction, by estimation, is 60 to 65%. The left ventricle has normal function. The left ventricle has no regional wall motion abnormalities. There is moderate left ventricular hypertrophy. Left ventricular diastolic parameters are consistent with Grade I diastolic dysfunction (impaired relaxation).  2. Right ventricular systolic function is normal. The right ventricular size is normal. There is normal pulmonary artery systolic pressure.  3. Left atrial size was mildly dilated.  4. The mitral valve is normal in structure. No evidence of mitral valve regurgitation. No evidence of mitral stenosis.  5. The aortic valve is normal in structure. There is mild calcification of the aortic valve. Aortic valve regurgitation is mild. Aortic valve sclerosis/calcification is present, without any evidence of aortic stenosis.  6. The inferior vena cava is normal in size with greater than 50% respiratory variability, suggesting right atrial pressure of 3 mmHg. Conclusion(s)/Recommendation(s): No intracardiac source of embolism detected on this transthoracic study. Consider a transesophageal  echocardiogram to exclude cardiac source of embolism if clinically indicated. FINDINGS  Left Ventricle: Left ventricular ejection fraction, by estimation, is 60 to 65%. The left ventricle has normal function. The left ventricle has no regional wall motion abnormalities. The left ventricular internal cavity size was normal in size. There is  moderate left ventricular hypertrophy. Left ventricular diastolic parameters are consistent with Grade I diastolic dysfunction (impaired relaxation). Right Ventricle: The right ventricular size is normal. No increase in right ventricular wall thickness. Right ventricular systolic function is normal. There is normal pulmonary artery systolic pressure. The tricuspid regurgitant velocity is 2.66 m/s, and  with an assumed right atrial pressure of 3 mmHg, the estimated right ventricular systolic pressure is 31.3 mmHg. Left Atrium: Left atrial size was mildly dilated. Right Atrium: Right atrial size was  normal in size. Pericardium: There is no evidence of pericardial effusion. Mitral Valve: The mitral valve is normal in structure. There is mild calcification of the mitral valve leaflet(s). No evidence of mitral valve regurgitation. No evidence of mitral valve stenosis. MV peak gradient, 5.6 mmHg. The mean mitral valve gradient  is 3.3 mmHg. Tricuspid Valve: The tricuspid valve is normal in structure. Tricuspid valve regurgitation is not demonstrated. No evidence of tricuspid stenosis. Aortic Valve: The aortic valve is normal in structure. There is mild calcification of the aortic valve. Aortic valve regurgitation is mild. Aortic regurgitation PHT measures 599 msec. Aortic valve sclerosis/calcification is present, without any evidence of aortic stenosis. Pulmonic Valve: The pulmonic valve was normal in structure. Pulmonic valve regurgitation is not visualized. No evidence of pulmonic stenosis. Aorta: The aortic root is normal in size and structure. Venous: The inferior vena cava is  normal in size with greater than 50% respiratory variability, suggesting right atrial pressure of 3 mmHg. IAS/Shunts: No atrial level shunt detected by color flow Doppler.  LEFT VENTRICLE PLAX 2D LVIDd:         3.80 cm   Diastology LVIDs:         2.50 cm   LV e' medial:    3.92 cm/s LV PW:         1.40 cm   LV E/e' medial:  31.6 LV IVS:        1.40 cm   LV e' lateral:   3.26 cm/s LVOT diam:     1.90 cm   LV E/e' lateral: 38.0 LV SV:         59 LV SV Index:   39 LVOT Area:     2.84 cm  RIGHT VENTRICLE             IVC RV S prime:     11.70 cm/s  IVC diam: 1.30 cm TAPSE (M-mode): 2.3 cm LEFT ATRIUM             Index        RIGHT ATRIUM           Index LA diam:        3.50 cm 2.30 cm/m   RA Area:     13.10 cm LA Vol (A2C):   90.8 ml 59.60 ml/m  RA Volume:   28.60 ml  18.77 ml/m LA Vol (A4C):   57.2 ml 37.55 ml/m LA Biplane Vol: 76.6 ml 50.28 ml/m  AORTIC VALVE LVOT Vmax:   111.00 cm/s LVOT Vmean:  74.300 cm/s LVOT VTI:    0.208 m AI PHT:      599 msec  AORTA Ao Root diam: 2.90 cm Ao Asc diam:  2.70 cm MITRAL VALVE                TRICUSPID VALVE MV Area (PHT): 1.60 cm     TR Peak grad:   28.3 mmHg MV Area VTI:   1.37 cm     TR Vmax:        266.00 cm/s MV Peak grad:  5.6 mmHg MV Mean grad:  3.3 mmHg     SHUNTS MV Vmax:       1.19 m/s     Systemic VTI:  0.21 m MV Vmean:      87.9 cm/s    Systemic Diam: 1.90 cm MV Decel Time: 473 msec MV E velocity: 124.00 cm/s MV A velocity: 120.00 cm/s MV E/A ratio:  1.03 Donato Schultz MD Electronically signed by  Donato Schultz MD Signature Date/Time: 10/19/2023/12:11:27 PM    Final    DG Chest Port 1 View Result Date: 10/18/2023 CLINICAL DATA:  [id number] Pre-op chest exam [phone]. EXAM: PORTABLE CHEST 1 VIEW COMPARISON:  Chest radiograph 08/05/2022. FINDINGS: Low lung volumes accentuate the pulmonary vasculature and cardiomediastinal silhouette. Basilar predominant reticular opacities, greater on the left, are favored to reflect age related fibrosis or scarring. No consolidation or  pulmonary edema. No pleural effusion or pneumothorax. Visualized bones and upper abdomen are unremarkable. IMPRESSION: Low lung volumes with basilar predominant reticular opacities, greater on the left, favored to reflect age related fibrosis or scarring. Electronically Signed   By: Orvan Falconer M.D.   On: 10/18/2023 16:31   DG Hip Unilat W or Wo Pelvis 2-3 Views Left Result Date: 10/18/2023 CLINICAL DATA:  Left hip pain.  Concern for fracture. EXAM: DG HIP (WITH OR WITHOUT PELVIS) 2-3V LEFT COMPARISON:  Pelvis and left hip radiographs 07/21/2018. FINDINGS: Three views of the left hip and pelvis. No acute fracture or dislocation of the left hip joint. Pelvic ring is intact. Degenerative changes of the lower lumbar spine and bilateral sacroiliac joints. Soft tissues are unremarkable. IMPRESSION: 1. No acute fracture or dislocation of the left hip joint. 2. Degenerative changes of the lower lumbar spine and bilateral sacroiliac joints. Electronically Signed   By: Orvan Falconer M.D.   On: 10/18/2023 16:29   MR BRAIN WO CONTRAST Result Date: 10/18/2023 CLINICAL DATA:  Neuro deficit, acute, stroke suspected LLE, LUE weakness. EXAM: MRI HEAD WITHOUT CONTRAST TECHNIQUE: Multiplanar, multiecho pulse sequences of the brain and surrounding structures were obtained without intravenous contrast. COMPARISON:  MRI brain 11/10/2010.  CT head 10/18/2023. FINDINGS: Brain: Acute perforator infarct in the right pons (axial image 16 series 5) just inferior to an old perforator infarct in the right pons (axial image 18 series 14). No acute hemorrhage or significant mass effect. Background of severe chronic small-vessel disease with old perforator infarcts in the bilateral thalami. No hydrocephalus or extra-axial collection. No mass or midline shift. Vascular: Normal flow voids. Skull and upper cervical spine: Normal marrow signal. Upper cervical spondylosis with at least moderate spinal canal stenosis at C4-5.  Sinuses/Orbits: No acute findings. Other: None. IMPRESSION: 1. Acute perforator infarct in the right pons just inferior to an old perforator infarct in the right pons. No acute hemorrhage or significant mass effect. 2. Background of severe chronic small-vessel disease with old perforator infarcts in the bilateral thalami. 3. Upper cervical spondylosis with at least moderate spinal canal stenosis at C4-5. Electronically Signed   By: Orvan Falconer M.D.   On: 10/18/2023 16:27   CT Head Wo Contrast Result Date: 10/18/2023 CLINICAL DATA:  Neuro deficit, acute, stroke suspected. Left foot drop. EXAM: CT HEAD WITHOUT CONTRAST TECHNIQUE: Contiguous axial images were obtained from the base of the skull through the vertex without intravenous contrast. RADIATION DOSE REDUCTION: This exam was performed according to the departmental dose-optimization program which includes automated exposure control, adjustment of the mA and/or kV according to patient size and/or use of iterative reconstruction technique. COMPARISON:  Head CT 11/09/2010.  MRI brain 11/10/2010. FINDINGS: Brain: No acute hemorrhage. Background of severe chronic small-vessel disease with age-indeterminate perforator infarcts in the bilateral thalami and central pons, new from the remote prior CT and MRI. Cortical gray-white differentiation is preserved. No hydrocephalus or extra-axial collection. No mass effect or midline shift. Vascular: No hyperdense vessel or unexpected calcification. Skull: No calvarial fracture or suspicious bone lesion. Skull base is  unremarkable. Sinuses/Orbits: No acute finding. Other: None. IMPRESSION: 1. No acute hemorrhage. 2. Background of severe chronic small-vessel disease with age-indeterminate perforator infarcts in the bilateral thalami and central pons, new from the remote prior CT and MRI. Electronically Signed   By: Orvan Falconer M.D.   On: 10/18/2023 15:45   MYOCARDIAL PERFUSION IMAGING Result Date: 10/07/2023   The  study is normal. The study is intermediate risk.   No ST deviation was noted.   Left ventricular function is abnormal. Global function is mildly reduced. Nuclear stress EF: 54%. The left ventricular ejection fraction is mildly decreased (45-54%). End diastolic cavity size is normal.   Hypertensive blood pressure a the start of the study and during stress.   Prior study available for comparison from 05/09/2021.    Microbiology: Results for orders placed or performed in visit on 02/17/23  Urine Culture     Status: None   Collection Time: 02/17/23  9:40 AM   Specimen: Urine   UR  Result Value Ref Range Status   Urine Culture, Routine Final report  Final   Organism ID, Bacteria Comment  Final    Comment: Culture shows less than 10,000 colony forming units of bacteria per milliliter of urine. This colony count is not generally considered to be clinically significant.   Microscopic Examination     Status: Abnormal   Collection Time: 02/17/23  9:40 AM   Urine  Result Value Ref Range Status   WBC, UA 0-5 0 - 5 /hpf Final   RBC, Urine None seen 0 - 2 /hpf Final   Epithelial Cells (non renal) 0-10 0 - 10 /hpf Final   Renal Epithel, UA None seen None seen /hpf Final   Bacteria, UA Few (A) None seen/Few Final    Labs: CBC: Recent Labs  Lab 10/18/23 1515  WBC 10.7*  NEUTROABS 8.5*  HGB 12.6  HCT 37.8  MCV 94.5  PLT 191   Basic Metabolic Panel: Recent Labs  Lab 10/18/23 1515  NA 137  K 4.3  CL 104  CO2 24  GLUCOSE 207*  BUN 21  CREATININE 1.21*  CALCIUM 10.1   Liver Function Tests: Recent Labs  Lab 10/18/23 1515  AST 16  ALT 10  ALKPHOS 58  BILITOT 0.7  PROT 7.1  ALBUMIN 3.6   CBG: Recent Labs  Lab 10/18/23 2119 10/19/23 0725 10/19/23 0751 10/19/23 0957 10/19/23 1114  GLUCAP 138* 69* 69* 208* 196*    Discharge time spent: greater than 30 minutes.  Signed: Catarina Hartshorn, MD [facility] 10/19/2023

## 2023-10-19 NOTE — Evaluation (Addendum)
Physical Therapy Evaluation Patient Details Name: Kara Dean MRN: [id number] DOB: [birth year] Today's Date: 10/19/2023  History of Present Illness  a 89 y.o. female with medical history significant of coronary artery disease, history of CVA with no deficits, type 2 diabetes, history of TIAs, hypertension, hyperlipidemia.  Patient seen for left-sided weakness with deficits that started sometime over the last 1 to 2 days.  She was last seen well 48 hours ago.  She started noticing difficulty ambulating with left-sided arm and leg weakness.  Due to ongoing symptoms, she was brought to the hospital for evaluation.  Patient received CT and an MRI.  MRI showed right pontine stroke.  The EDP discussed patient with neurology.  No current beds at [facility], so neurology recommended that the patient be admitted here and they will give recommendations remotely.  Clinical Impression   Pt tolerated today's Physical Therapy Evaluation, well with overall good return for ambulation, transfers, and balance with AD. Tolerating 42ft of ambulation with minimal assist with turning and RW management. Pt at baseline independent, currently given moderate assist for bed mobility, CGA and min assist for OOB mobility activities, impacting safety and independence due to muscle weakness in L>R, balance deficits, in setting of acute CVA. Pt and daughter desiring to return home with [facility] PT services and pertinent DME. Based upon these deficits/impairments, patient will benefit from continued skilled physical therapy services during remainder of hospital stay and at the next recommended venue of care to address deficits and promote return to optimal function.                If plan is discharge home, recommend the following: A little help with walking and/or transfers;A little help with bathing/dressing/bathroom   Can travel by private vehicle        Equipment Recommendations Rolling walker (2 wheels);BSC/3in1   Recommendations for Other Services       Functional Status Assessment Patient has had a recent decline in their functional status and demonstrates the ability to make significant improvements in function in a reasonable and predictable amount of time.     Precautions / Restrictions Precautions Precautions: Fall Restrictions Weight Bearing Restrictions Per Provider Order: No      Mobility  Bed Mobility Overal bed mobility: Needs Assistance Bed Mobility: Supine to Sit     Supine to sit: Mod assist     General bed mobility comments: cues for rolling and grabbing bed rail. mod assist to elevate trunk    Transfers Overall transfer level: Needs assistance   Transfers: Sit to/from Stand Sit to Stand: Contact guard assist           General transfer comment: CGA for steadying to RW with pt powering to standing with BUE on RW and BLE on floor    Ambulation/Gait Ambulation/Gait assistance: Min assist Gait Distance (Feet): 15 Feet Assistive device: Rolling walker (2 wheels) Gait Pattern/deviations: Step-through pattern, Decreased step length - left, Decreased stance time - left, Decreased weight shift to left       General Gait Details: 105ft with RW, LLE with limited and increased IR during swing and stance phase. Min assist for balance and RW with turning to L side. No LOB.  Stairs            Wheelchair Mobility     Tilt Bed    Modified Rankin (Stroke Patients Only)       Balance Overall balance assessment: Needs assistance Sitting-balance support: No upper extremity supported, Feet  supported Sitting balance-Leahy Scale: Good Sitting balance - Comments: sitting EOB and in recliner Postural control: Right lateral lean Standing balance support: Bilateral upper extremity supported, During functional activity, Reliant on assistive device for balance Standing balance-Leahy Scale: Fair Standing balance comment: for standing with RW. mild R weight shift                              Pertinent Vitals/Pain Pain Assessment Pain Assessment: No/denies pain    Home Living Family/patient expects to be discharged to:: Private residence Living Arrangements: Children Available Help at Discharge: Family Type of Home: Mobile home Home Access: Stairs to enter Entrance Stairs-Rails: Doctor, general practice of Steps: 3   Home Layout: One level Home Equipment: Agricultural consultant (2 wheels);Rollator (4 wheels);Shower seat      Prior Function Prior Level of Function : Independent/Modified Independent             Mobility Comments: Independent with all mobility per pt and daughter ADLs Comments: Daughter lives with pt and provides intermittent assist     Extremity/Trunk Assessment   Upper Extremity Assessment Upper Extremity Assessment: Overall WFL for tasks assessed    Lower Extremity Assessment Lower Extremity Assessment: RLE deficits/detail;LLE deficits/detail;Generalized weakness RLE Deficits / Details: 3+/5 knee extension RLE Sensation: WNL LLE Deficits / Details: 3+/5 knee extension LLE Sensation: WNL       Communication   Communication Communication: No apparent difficulties  Cognition Arousal: Alert Behavior During Therapy: WFL for tasks assessed/performed Overall Cognitive Status: Within Functional Limits for tasks assessed                                          General Comments      Exercises     Assessment/Plan    PT Assessment Patient needs continued PT services  PT Problem List Decreased strength;Decreased range of motion;Decreased balance;Decreased mobility;Decreased coordination       PT Treatment Interventions DME instruction;Gait training;Functional mobility training;Therapeutic activities;Therapeutic exercise;Balance training;Neuromuscular re-education    PT Goals (Current goals can be found in the Care Plan section)  Acute Rehab PT Goals Patient Stated Goal: return  home with Home health services PT Goal Formulation: With patient Time For Goal Achievement: 11/02/23 Potential to Achieve Goals: Good    Frequency Min 3X/week     Co-evaluation               AM-PAC PT "6 Clicks" Mobility  Outcome Measure Help needed turning from your back to your side while in a flat bed without using bedrails?: A Little Help needed moving from lying on your back to sitting on the side of a flat bed without using bedrails?: A Little Help needed moving to and from a bed to a chair (including a wheelchair)?: A Little Help needed standing up from a chair using your arms (e.g., wheelchair or bedside chair)?: A Little Help needed to walk in hospital room?: A Little Help needed climbing 3-5 steps with a railing? : A Lot 6 Click Score: 17    End of Session Equipment Utilized During Treatment: Gait belt Activity Tolerance: Patient tolerated treatment well Patient left: in chair;with call bell/phone within reach;with family/visitor present Nurse Communication: Mobility status PT Visit Diagnosis: Unsteadiness on feet (R26.81);Muscle weakness (generalized) (M62.81);Other abnormalities of gait and mobility (R26.89)    Time: 0910 (screen from [id number] (screen  to 0803) PT Time Calculation (min) (ACUTE ONLY): 14 min   Charges:   PT Evaluation $PT Eval Low Complexity: 1 Low   PT General Charges $$ ACUTE PT VISIT: 1 Visit         Elie Goody, DPT [facility] [place] [phone] office  Nelida Meuse 10/19/2023, 9:32 AM

## 2023-10-19 NOTE — Plan of Care (Signed)
  Problem: Acute Rehab PT Goals(only PT should resolve) Goal: Pt Will Go Supine/Side To Sit Flowsheets (Taken 10/19/2023 0935) Pt will go Supine/Side to Sit: with contact guard assist Goal: Patient Will Transfer Sit To/From Stand Flowsheets (Taken 10/19/2023 0935) Patient will transfer sit to/from stand: with modified independence Goal: Pt Will Ambulate Flowsheets (Taken 10/19/2023 0935) Pt will Ambulate:  with modified independence  75 feet  with least restrictive assistive device  Nelida Meuse PT, DPT [facility] [place] 336 [phone] office

## 2023-10-19 NOTE — TOC Initial Note (Addendum)
Transition of Care [facility]) - Initial/Assessment Note    Patient Details  Name: Kara Dean MRN: [id number] Date of Birth: 12/24/1932  Transition of Care [facility]) CM/SW Contact:    Villa Herb, LCSWA Phone Number: 10/19/2023, 1:19 PM  Clinical Narrative:                 CSW updated that PT is recommending HH PT for pt at D/C. CSW met with pt and family at bedside to complete assessment. Pt lives with her daughter. Pts daughter assists as needed. Pt has a cane to use when needed. Pts son states they have a walker pt can use as well. Pts daughter is interested in [facility] and does not have an agency preference. CSW spoke to [place] with [facility] who accepted [facility] PT referral, CSW requested MD place [facility] orders. TOC to follow.   Expected Discharge Plan: Home w Home Health Services Barriers to Discharge: Continued Medical Work up   Patient Goals and CMS Choice Patient states their goals for this hospitalization and ongoing recovery are:: return home CMS Medicare.gov Compare Post Acute Care list provided to:: Patient Represenative (must comment) Choice offered to / list presented to : Patient, Adult Children      Expected Discharge Plan and Services In-house Referral: Clinical Social Work Discharge Planning Services: CM Consult Post Acute Care Choice: Home Health Living arrangements for the past 2 months: Apartment                           HH Arranged: PT HH Agency: [facility] Date [facility] Agency Contacted: 10/19/23   Representative spoke with at [facility] Agency: Kandee Keen  Prior Living Arrangements/Services Living arrangements for the past 2 months: Apartment Lives with:: Adult Children Patient language and need for interpreter reviewed:: Yes Do you feel safe going back to the place where you live?: Yes      Need for Family Participation in Patient Care: Yes (Comment) Care giver support system in place?: Yes (comment) Current home services: DME Criminal Activity/Legal Involvement  Pertinent to Current Situation/Hospitalization: No - Comment as needed  Activities of Daily Living   ADL Screening (condition at time of admission) Independently performs ADLs?: Yes (appropriate for developmental age) Is the patient deaf or have difficulty hearing?: No Does the patient have difficulty seeing, even when wearing glasses/contacts?: No Does the patient have difficulty concentrating, remembering, or making decisions?: No  Permission Sought/Granted                  Emotional Assessment Appearance:: Appears stated age Attitude/Demeanor/Rapport: Engaged Affect (typically observed): Accepting   Alcohol / Substance Use: Not Applicable Psych Involvement: No (comment)  Admission diagnosis:  Right pontine stroke (HCC) [I63.50] Cerebrovascular accident (CVA), unspecified mechanism (HCC) [I63.9] Patient Active Problem List   Diagnosis Date Noted   Right pontine stroke (HCC) 10/18/2023   Choledocholithiasis with acute cholecystitis 05/07/2021   Peripheral edema 03/18/2017   Chronic kidney disease (CKD) stage G3a/A3, moderately decreased glomerular filtration rate (GFR) between 45-59 mL/min/1.73 square meter and albuminuria creatinine ratio greater than 300 mg/g (HCC) 07/27/2014   Gastroesophageal reflux disease without esophagitis 04/27/2014   GAD (generalized anxiety disorder) 04/27/2014   Late effect of cerebrovascular accident (CVA) 03/26/2010   Hyperlipidemia associated with type 2 diabetes mellitus (HCC) 02/01/2009   Depression 02/01/2009   Essential hypertension 02/01/2009   Osteoarthritis 02/01/2009   Diabetes mellitus with insulin therapy (HCC) 02/01/2009   PCP:  Daphine Deutscher, Mary-Margaret,  FNP Pharmacy:   [facility] [place], [place] - [address] [place] [address] Phone: [phone] Fax: [phone]     Social Drivers of Health (SDOH) Social History: SDOH Screenings   Food Insecurity: Food Insecurity Present  (10/18/2023)  Housing: Low Risk  (10/18/2023)  Transportation Needs: No Transportation Needs (10/18/2023)  Utilities: Not At Risk (10/18/2023)  Alcohol Screen: Low Risk  (03/12/2023)  Depression (PHQ2-9): Medium Risk (09/05/2023)  Financial Resource Strain: Low Risk  (03/12/2023)  Physical Activity: Insufficiently Active (03/12/2023)  Social Connections: Socially Isolated (03/12/2023)  Stress: No Stress Concern Present (03/12/2023)  Tobacco Use: Medium Risk (10/18/2023)   SDOH Interventions:     Readmission Risk Interventions    10/19/2023    1:18 PM  Readmission Risk Prevention Plan  Medication Screening Complete  Transportation Screening Complete

## 2023-10-19 NOTE — Progress Notes (Signed)
  Echocardiogram 2D Echocardiogram has been performed.  Leda Roys RDCS 10/19/2023, 10:37 AM

## 2023-10-19 NOTE — Care Management Obs Status (Signed)
MEDICARE OBSERVATION STATUS NOTIFICATION   Patient Details  Name: Kara Dean MRN: [id number] Date of Birth: [birth year]   Medicare Observation Status Notification Given:  Yes    Villa Herb, Theresia Majors 10/19/2023, 3:38 PM

## 2023-10-19 NOTE — Care Management CC44 (Signed)
Condition Code 44 Documentation Completed  Patient Details  Name: Kara Dean MRN: [id number] Date of Birth: December 02, 1932   Condition Code 44 given:  Yes Patient signature on Condition Code 44 notice:  Yes Documentation of 2 MD's agreement:  Yes Code 44 added to claim:  Yes    Villa Herb, LCSWA 10/19/2023, 3:38 PM

## 2023-10-19 NOTE — Progress Notes (Addendum)
On-call neurology note  Called by Dr. Arbutus Leas for follow-up on stroke workup.  CT angiogram of the head and neck: Mild proximal right P2 narrowing.  Atherosclerotic changes at carotid bifurcations bilaterally and cavernous ICAs bilaterally without significant stenosis. CT head as a part of the CT angiogram-no hemorrhage in the right pontine infarct.  Stable moderate generalized atrophy and white matter disease. 2D echo: LVEF 60 to 65%, RV systolic function normal, LA mildly dilated.  No atrial level shunt detected by color-flow Doppler. MRI brain: Acute right pontine stroke LDL 85  Impression: Acute ischemic right pontine stroke-appearance of the stroke concerning for small vessel etiology but her workup has also revealed left atrial dilatation which can be sometimes seen in paroxysmal A-fib  Recommendations: Patient has been on Aggrenox for many years at home.  I would change that to DAPT for 3 weeks with ASA and Plavix followed by ASA only and have her follow-up with her cardiologist and neurologist outpatient  High intensity statin for LDL less than 70.  She is on atorvastatin 10 at home.  I would recommend increasing it to atorvastatin 20. A1c 7.3.  Goal A1c less than 7-medical management per primary team. Therapy assessments and follow-up per primary team Outpatient 30-day cardiac monitor to evaluate for arrhythmias specifically underlying paroxysmal atrial fibrillation Follow-up with outpatient neurology in 6-8 weeks (Stroke NP at [facility]) I will be available with questions as needed  -- Milon Dikes, MD Neurologist [facility]

## 2023-10-21 ENCOUNTER — Telehealth: Payer: Self-pay | Admitting: Family Medicine

## 2023-10-21 DIAGNOSIS — R2681 Unsteadiness on feet: Secondary | ICD-10-CM

## 2023-10-21 NOTE — Telephone Encounter (Unsigned)
Copied from CRM [id number]. Topic: Clinical - Home Health Verbal Orders >> Oct 21, 2023 12:21 PM Fuller Mandril wrote: Caller/Agency:  Leonel Ramsay (Daughter) Callback Number: [phone] Service Requested: Pt recently had stroke and needs 24 hours care. Frances Furbish only comes in 3 times per week. Pt resides at [employer] and Caller wanted to know if a letter could be written to Investment banker, corporate (same as before) that states that pt needs 24 hour care and that [name] (Daughter) will have to reside with pt at residence to provide the care.  Frequency: 24 hours  Any new concerns about the patient? Yes - recent stroke

## 2023-10-21 NOTE — Telephone Encounter (Signed)
Last office visit with you on 09/05/23.  Can you do this and does patient need to be seen?

## 2023-10-21 NOTE — Telephone Encounter (Signed)
Please help.

## 2023-10-22 ENCOUNTER — Other Ambulatory Visit: Payer: Self-pay | Admitting: Nurse Practitioner

## 2023-10-22 NOTE — Telephone Encounter (Signed)
I can do a Personal Care Service form for the patient but this is only going to give her help in the home for so many hours a day. She is wanting a letter written to her apartment complex in order to have her daughter live with her 24 hours a day.

## 2023-10-23 ENCOUNTER — Telehealth: Payer: Self-pay | Admitting: Family Medicine

## 2023-10-23 NOTE — Telephone Encounter (Signed)
Copied from CRM [id number]. Topic: Clinical - Medication Question >> Oct 23, 2023  8:59 AM Fonda Kinder J wrote: Reason for CRM: Pt's daughter wants to know why her mom has to take:: metoprolol succinate, olmesartan, and amLODipine. She says she's concerned she was prescribed 3 different medications because her blood pressure hasn't been high and she hasn't been given her the medication yet because she wants to know more information. Please give daughter a callback [phone]

## 2023-10-23 NOTE — Telephone Encounter (Signed)
Please write letter for patient

## 2023-10-24 ENCOUNTER — Other Ambulatory Visit: Payer: Self-pay | Admitting: Nurse Practitioner

## 2023-10-24 DIAGNOSIS — I10 Essential (primary) hypertension: Secondary | ICD-10-CM

## 2023-10-24 NOTE — Telephone Encounter (Signed)
Spoke with daughter and she is suppose  to be on all blood pressure meds. Family understands

## 2023-10-25 DIAGNOSIS — M47816 Spondylosis without myelopathy or radiculopathy, lumbar region: Secondary | ICD-10-CM | POA: Diagnosis not present

## 2023-10-25 DIAGNOSIS — M48061 Spinal stenosis, lumbar region without neurogenic claudication: Secondary | ICD-10-CM | POA: Diagnosis not present

## 2023-10-25 DIAGNOSIS — K219 Gastro-esophageal reflux disease without esophagitis: Secondary | ICD-10-CM | POA: Diagnosis not present

## 2023-10-25 DIAGNOSIS — I080 Rheumatic disorders of both mitral and aortic valves: Secondary | ICD-10-CM | POA: Diagnosis not present

## 2023-10-25 DIAGNOSIS — I5030 Unspecified diastolic (congestive) heart failure: Secondary | ICD-10-CM | POA: Diagnosis not present

## 2023-10-25 DIAGNOSIS — Z85828 Personal history of other malignant neoplasm of skin: Secondary | ICD-10-CM | POA: Diagnosis not present

## 2023-10-25 DIAGNOSIS — E782 Mixed hyperlipidemia: Secondary | ICD-10-CM | POA: Diagnosis not present

## 2023-10-25 DIAGNOSIS — I700 Atherosclerosis of aorta: Secondary | ICD-10-CM | POA: Diagnosis not present

## 2023-10-25 DIAGNOSIS — M47812 Spondylosis without myelopathy or radiculopathy, cervical region: Secondary | ICD-10-CM | POA: Diagnosis not present

## 2023-10-25 DIAGNOSIS — K5090 Crohn's disease, unspecified, without complications: Secondary | ICD-10-CM | POA: Diagnosis not present

## 2023-10-25 DIAGNOSIS — I130 Hypertensive heart and chronic kidney disease with heart failure and stage 1 through stage 4 chronic kidney disease, or unspecified chronic kidney disease: Secondary | ICD-10-CM | POA: Diagnosis not present

## 2023-10-25 DIAGNOSIS — I2510 Atherosclerotic heart disease of native coronary artery without angina pectoris: Secondary | ICD-10-CM | POA: Diagnosis not present

## 2023-10-25 DIAGNOSIS — Z7982 Long term (current) use of aspirin: Secondary | ICD-10-CM | POA: Diagnosis not present

## 2023-10-25 DIAGNOSIS — I480 Paroxysmal atrial fibrillation: Secondary | ICD-10-CM | POA: Diagnosis not present

## 2023-10-25 DIAGNOSIS — M4802 Spinal stenosis, cervical region: Secondary | ICD-10-CM | POA: Diagnosis not present

## 2023-10-25 DIAGNOSIS — N1832 Chronic kidney disease, stage 3b: Secondary | ICD-10-CM | POA: Diagnosis not present

## 2023-10-25 DIAGNOSIS — Z794 Long term (current) use of insulin: Secondary | ICD-10-CM | POA: Diagnosis not present

## 2023-10-25 DIAGNOSIS — Z7902 Long term (current) use of antithrombotics/antiplatelets: Secondary | ICD-10-CM | POA: Diagnosis not present

## 2023-10-25 DIAGNOSIS — E1169 Type 2 diabetes mellitus with other specified complication: Secondary | ICD-10-CM | POA: Diagnosis not present

## 2023-10-25 DIAGNOSIS — E1122 Type 2 diabetes mellitus with diabetic chronic kidney disease: Secondary | ICD-10-CM | POA: Diagnosis not present

## 2023-10-25 DIAGNOSIS — M461 Sacroiliitis, not elsewhere classified: Secondary | ICD-10-CM | POA: Diagnosis not present

## 2023-10-25 DIAGNOSIS — Z79899 Other long term (current) drug therapy: Secondary | ICD-10-CM | POA: Diagnosis not present

## 2023-10-25 DIAGNOSIS — I69354 Hemiplegia and hemiparesis following cerebral infarction affecting left non-dominant side: Secondary | ICD-10-CM | POA: Diagnosis not present

## 2023-10-27 NOTE — Addendum Note (Signed)
Addended by: Cleda Daub on: 10/27/2023 01:50 PM   Modules accepted: Orders

## 2023-10-27 NOTE — Telephone Encounter (Signed)
Called and spoke with daughter. Letter written and rx for bedside commode left up front for patient pick up.

## 2023-10-31 DIAGNOSIS — E782 Mixed hyperlipidemia: Secondary | ICD-10-CM | POA: Diagnosis not present

## 2023-10-31 DIAGNOSIS — M461 Sacroiliitis, not elsewhere classified: Secondary | ICD-10-CM | POA: Diagnosis not present

## 2023-10-31 DIAGNOSIS — Z794 Long term (current) use of insulin: Secondary | ICD-10-CM | POA: Diagnosis not present

## 2023-10-31 DIAGNOSIS — Z7982 Long term (current) use of aspirin: Secondary | ICD-10-CM | POA: Diagnosis not present

## 2023-10-31 DIAGNOSIS — I69354 Hemiplegia and hemiparesis following cerebral infarction affecting left non-dominant side: Secondary | ICD-10-CM | POA: Diagnosis not present

## 2023-10-31 DIAGNOSIS — I130 Hypertensive heart and chronic kidney disease with heart failure and stage 1 through stage 4 chronic kidney disease, or unspecified chronic kidney disease: Secondary | ICD-10-CM | POA: Diagnosis not present

## 2023-10-31 DIAGNOSIS — M47812 Spondylosis without myelopathy or radiculopathy, cervical region: Secondary | ICD-10-CM | POA: Diagnosis not present

## 2023-10-31 DIAGNOSIS — M4802 Spinal stenosis, cervical region: Secondary | ICD-10-CM | POA: Diagnosis not present

## 2023-10-31 DIAGNOSIS — K219 Gastro-esophageal reflux disease without esophagitis: Secondary | ICD-10-CM | POA: Diagnosis not present

## 2023-10-31 DIAGNOSIS — I2510 Atherosclerotic heart disease of native coronary artery without angina pectoris: Secondary | ICD-10-CM | POA: Diagnosis not present

## 2023-10-31 DIAGNOSIS — M48061 Spinal stenosis, lumbar region without neurogenic claudication: Secondary | ICD-10-CM | POA: Diagnosis not present

## 2023-10-31 DIAGNOSIS — I5030 Unspecified diastolic (congestive) heart failure: Secondary | ICD-10-CM | POA: Diagnosis not present

## 2023-10-31 DIAGNOSIS — K5090 Crohn's disease, unspecified, without complications: Secondary | ICD-10-CM | POA: Diagnosis not present

## 2023-10-31 DIAGNOSIS — Z85828 Personal history of other malignant neoplasm of skin: Secondary | ICD-10-CM | POA: Diagnosis not present

## 2023-10-31 DIAGNOSIS — E1122 Type 2 diabetes mellitus with diabetic chronic kidney disease: Secondary | ICD-10-CM | POA: Diagnosis not present

## 2023-10-31 DIAGNOSIS — M47816 Spondylosis without myelopathy or radiculopathy, lumbar region: Secondary | ICD-10-CM | POA: Diagnosis not present

## 2023-10-31 DIAGNOSIS — N1832 Chronic kidney disease, stage 3b: Secondary | ICD-10-CM | POA: Diagnosis not present

## 2023-10-31 DIAGNOSIS — I700 Atherosclerosis of aorta: Secondary | ICD-10-CM | POA: Diagnosis not present

## 2023-10-31 DIAGNOSIS — I480 Paroxysmal atrial fibrillation: Secondary | ICD-10-CM | POA: Diagnosis not present

## 2023-10-31 DIAGNOSIS — I080 Rheumatic disorders of both mitral and aortic valves: Secondary | ICD-10-CM | POA: Diagnosis not present

## 2023-10-31 DIAGNOSIS — Z7902 Long term (current) use of antithrombotics/antiplatelets: Secondary | ICD-10-CM | POA: Diagnosis not present

## 2023-10-31 DIAGNOSIS — E1169 Type 2 diabetes mellitus with other specified complication: Secondary | ICD-10-CM | POA: Diagnosis not present

## 2023-10-31 DIAGNOSIS — Z79899 Other long term (current) drug therapy: Secondary | ICD-10-CM | POA: Diagnosis not present

## 2023-11-03 DIAGNOSIS — M48061 Spinal stenosis, lumbar region without neurogenic claudication: Secondary | ICD-10-CM | POA: Diagnosis not present

## 2023-11-03 DIAGNOSIS — E1169 Type 2 diabetes mellitus with other specified complication: Secondary | ICD-10-CM | POA: Diagnosis not present

## 2023-11-03 DIAGNOSIS — M461 Sacroiliitis, not elsewhere classified: Secondary | ICD-10-CM | POA: Diagnosis not present

## 2023-11-03 DIAGNOSIS — E782 Mixed hyperlipidemia: Secondary | ICD-10-CM | POA: Diagnosis not present

## 2023-11-03 DIAGNOSIS — I130 Hypertensive heart and chronic kidney disease with heart failure and stage 1 through stage 4 chronic kidney disease, or unspecified chronic kidney disease: Secondary | ICD-10-CM | POA: Diagnosis not present

## 2023-11-03 DIAGNOSIS — Z79899 Other long term (current) drug therapy: Secondary | ICD-10-CM | POA: Diagnosis not present

## 2023-11-03 DIAGNOSIS — Z85828 Personal history of other malignant neoplasm of skin: Secondary | ICD-10-CM | POA: Diagnosis not present

## 2023-11-03 DIAGNOSIS — I2510 Atherosclerotic heart disease of native coronary artery without angina pectoris: Secondary | ICD-10-CM | POA: Diagnosis not present

## 2023-11-03 DIAGNOSIS — M47812 Spondylosis without myelopathy or radiculopathy, cervical region: Secondary | ICD-10-CM | POA: Diagnosis not present

## 2023-11-03 DIAGNOSIS — Z7982 Long term (current) use of aspirin: Secondary | ICD-10-CM | POA: Diagnosis not present

## 2023-11-03 DIAGNOSIS — I080 Rheumatic disorders of both mitral and aortic valves: Secondary | ICD-10-CM | POA: Diagnosis not present

## 2023-11-03 DIAGNOSIS — M47816 Spondylosis without myelopathy or radiculopathy, lumbar region: Secondary | ICD-10-CM | POA: Diagnosis not present

## 2023-11-03 DIAGNOSIS — I69354 Hemiplegia and hemiparesis following cerebral infarction affecting left non-dominant side: Secondary | ICD-10-CM | POA: Diagnosis not present

## 2023-11-03 DIAGNOSIS — Z7902 Long term (current) use of antithrombotics/antiplatelets: Secondary | ICD-10-CM | POA: Diagnosis not present

## 2023-11-03 DIAGNOSIS — I5030 Unspecified diastolic (congestive) heart failure: Secondary | ICD-10-CM | POA: Diagnosis not present

## 2023-11-03 DIAGNOSIS — Z794 Long term (current) use of insulin: Secondary | ICD-10-CM | POA: Diagnosis not present

## 2023-11-03 DIAGNOSIS — K5090 Crohn's disease, unspecified, without complications: Secondary | ICD-10-CM | POA: Diagnosis not present

## 2023-11-03 DIAGNOSIS — I480 Paroxysmal atrial fibrillation: Secondary | ICD-10-CM | POA: Diagnosis not present

## 2023-11-03 DIAGNOSIS — E1122 Type 2 diabetes mellitus with diabetic chronic kidney disease: Secondary | ICD-10-CM | POA: Diagnosis not present

## 2023-11-03 DIAGNOSIS — I700 Atherosclerosis of aorta: Secondary | ICD-10-CM | POA: Diagnosis not present

## 2023-11-03 DIAGNOSIS — K219 Gastro-esophageal reflux disease without esophagitis: Secondary | ICD-10-CM | POA: Diagnosis not present

## 2023-11-03 DIAGNOSIS — N1832 Chronic kidney disease, stage 3b: Secondary | ICD-10-CM | POA: Diagnosis not present

## 2023-11-03 DIAGNOSIS — M4802 Spinal stenosis, cervical region: Secondary | ICD-10-CM | POA: Diagnosis not present

## 2023-11-06 ENCOUNTER — Ambulatory Visit: Payer: 59

## 2023-11-06 DIAGNOSIS — N1832 Chronic kidney disease, stage 3b: Secondary | ICD-10-CM

## 2023-11-06 DIAGNOSIS — I130 Hypertensive heart and chronic kidney disease with heart failure and stage 1 through stage 4 chronic kidney disease, or unspecified chronic kidney disease: Secondary | ICD-10-CM | POA: Diagnosis not present

## 2023-11-06 DIAGNOSIS — I5030 Unspecified diastolic (congestive) heart failure: Secondary | ICD-10-CM

## 2023-11-06 DIAGNOSIS — K219 Gastro-esophageal reflux disease without esophagitis: Secondary | ICD-10-CM

## 2023-11-06 DIAGNOSIS — F32A Depression, unspecified: Secondary | ICD-10-CM

## 2023-11-06 DIAGNOSIS — F419 Anxiety disorder, unspecified: Secondary | ICD-10-CM

## 2023-11-06 DIAGNOSIS — E1122 Type 2 diabetes mellitus with diabetic chronic kidney disease: Secondary | ICD-10-CM | POA: Diagnosis not present

## 2023-11-06 DIAGNOSIS — I2510 Atherosclerotic heart disease of native coronary artery without angina pectoris: Secondary | ICD-10-CM

## 2023-11-06 DIAGNOSIS — E1169 Type 2 diabetes mellitus with other specified complication: Secondary | ICD-10-CM

## 2023-11-06 DIAGNOSIS — E782 Mixed hyperlipidemia: Secondary | ICD-10-CM | POA: Diagnosis not present

## 2023-11-06 DIAGNOSIS — I69354 Hemiplegia and hemiparesis following cerebral infarction affecting left non-dominant side: Secondary | ICD-10-CM | POA: Diagnosis not present

## 2023-11-06 DIAGNOSIS — I480 Paroxysmal atrial fibrillation: Secondary | ICD-10-CM

## 2023-11-07 DIAGNOSIS — I69354 Hemiplegia and hemiparesis following cerebral infarction affecting left non-dominant side: Secondary | ICD-10-CM | POA: Diagnosis not present

## 2023-11-07 DIAGNOSIS — K5090 Crohn's disease, unspecified, without complications: Secondary | ICD-10-CM | POA: Diagnosis not present

## 2023-11-07 DIAGNOSIS — M461 Sacroiliitis, not elsewhere classified: Secondary | ICD-10-CM | POA: Diagnosis not present

## 2023-11-07 DIAGNOSIS — E1122 Type 2 diabetes mellitus with diabetic chronic kidney disease: Secondary | ICD-10-CM | POA: Diagnosis not present

## 2023-11-07 DIAGNOSIS — Z7982 Long term (current) use of aspirin: Secondary | ICD-10-CM | POA: Diagnosis not present

## 2023-11-07 DIAGNOSIS — Z85828 Personal history of other malignant neoplasm of skin: Secondary | ICD-10-CM | POA: Diagnosis not present

## 2023-11-07 DIAGNOSIS — K219 Gastro-esophageal reflux disease without esophagitis: Secondary | ICD-10-CM | POA: Diagnosis not present

## 2023-11-07 DIAGNOSIS — I2510 Atherosclerotic heart disease of native coronary artery without angina pectoris: Secondary | ICD-10-CM | POA: Diagnosis not present

## 2023-11-07 DIAGNOSIS — I080 Rheumatic disorders of both mitral and aortic valves: Secondary | ICD-10-CM | POA: Diagnosis not present

## 2023-11-07 DIAGNOSIS — N1832 Chronic kidney disease, stage 3b: Secondary | ICD-10-CM | POA: Diagnosis not present

## 2023-11-07 DIAGNOSIS — E1169 Type 2 diabetes mellitus with other specified complication: Secondary | ICD-10-CM | POA: Diagnosis not present

## 2023-11-07 DIAGNOSIS — E782 Mixed hyperlipidemia: Secondary | ICD-10-CM | POA: Diagnosis not present

## 2023-11-07 DIAGNOSIS — M4802 Spinal stenosis, cervical region: Secondary | ICD-10-CM | POA: Diagnosis not present

## 2023-11-07 DIAGNOSIS — M47816 Spondylosis without myelopathy or radiculopathy, lumbar region: Secondary | ICD-10-CM | POA: Diagnosis not present

## 2023-11-07 DIAGNOSIS — I480 Paroxysmal atrial fibrillation: Secondary | ICD-10-CM | POA: Diagnosis not present

## 2023-11-07 DIAGNOSIS — I700 Atherosclerosis of aorta: Secondary | ICD-10-CM | POA: Diagnosis not present

## 2023-11-07 DIAGNOSIS — M48061 Spinal stenosis, lumbar region without neurogenic claudication: Secondary | ICD-10-CM | POA: Diagnosis not present

## 2023-11-07 DIAGNOSIS — M47812 Spondylosis without myelopathy or radiculopathy, cervical region: Secondary | ICD-10-CM | POA: Diagnosis not present

## 2023-11-07 DIAGNOSIS — I130 Hypertensive heart and chronic kidney disease with heart failure and stage 1 through stage 4 chronic kidney disease, or unspecified chronic kidney disease: Secondary | ICD-10-CM | POA: Diagnosis not present

## 2023-11-07 DIAGNOSIS — Z794 Long term (current) use of insulin: Secondary | ICD-10-CM | POA: Diagnosis not present

## 2023-11-07 DIAGNOSIS — Z7902 Long term (current) use of antithrombotics/antiplatelets: Secondary | ICD-10-CM | POA: Diagnosis not present

## 2023-11-07 DIAGNOSIS — I5030 Unspecified diastolic (congestive) heart failure: Secondary | ICD-10-CM | POA: Diagnosis not present

## 2023-11-07 DIAGNOSIS — Z79899 Other long term (current) drug therapy: Secondary | ICD-10-CM | POA: Diagnosis not present

## 2023-11-10 ENCOUNTER — Other Ambulatory Visit: Payer: Self-pay | Admitting: Cardiovascular Disease

## 2023-11-10 DIAGNOSIS — Z7902 Long term (current) use of antithrombotics/antiplatelets: Secondary | ICD-10-CM | POA: Diagnosis not present

## 2023-11-10 DIAGNOSIS — E1122 Type 2 diabetes mellitus with diabetic chronic kidney disease: Secondary | ICD-10-CM | POA: Diagnosis not present

## 2023-11-10 DIAGNOSIS — I2510 Atherosclerotic heart disease of native coronary artery without angina pectoris: Secondary | ICD-10-CM | POA: Diagnosis not present

## 2023-11-10 DIAGNOSIS — M48061 Spinal stenosis, lumbar region without neurogenic claudication: Secondary | ICD-10-CM | POA: Diagnosis not present

## 2023-11-10 DIAGNOSIS — K5090 Crohn's disease, unspecified, without complications: Secondary | ICD-10-CM | POA: Diagnosis not present

## 2023-11-10 DIAGNOSIS — M4802 Spinal stenosis, cervical region: Secondary | ICD-10-CM | POA: Diagnosis not present

## 2023-11-10 DIAGNOSIS — Z7982 Long term (current) use of aspirin: Secondary | ICD-10-CM | POA: Diagnosis not present

## 2023-11-10 DIAGNOSIS — M47816 Spondylosis without myelopathy or radiculopathy, lumbar region: Secondary | ICD-10-CM | POA: Diagnosis not present

## 2023-11-10 DIAGNOSIS — I69354 Hemiplegia and hemiparesis following cerebral infarction affecting left non-dominant side: Secondary | ICD-10-CM | POA: Diagnosis not present

## 2023-11-10 DIAGNOSIS — M47812 Spondylosis without myelopathy or radiculopathy, cervical region: Secondary | ICD-10-CM | POA: Diagnosis not present

## 2023-11-10 DIAGNOSIS — I700 Atherosclerosis of aorta: Secondary | ICD-10-CM | POA: Diagnosis not present

## 2023-11-10 DIAGNOSIS — N1832 Chronic kidney disease, stage 3b: Secondary | ICD-10-CM | POA: Diagnosis not present

## 2023-11-10 DIAGNOSIS — I080 Rheumatic disorders of both mitral and aortic valves: Secondary | ICD-10-CM | POA: Diagnosis not present

## 2023-11-10 DIAGNOSIS — E1169 Type 2 diabetes mellitus with other specified complication: Secondary | ICD-10-CM | POA: Diagnosis not present

## 2023-11-10 DIAGNOSIS — Z85828 Personal history of other malignant neoplasm of skin: Secondary | ICD-10-CM | POA: Diagnosis not present

## 2023-11-10 DIAGNOSIS — K219 Gastro-esophageal reflux disease without esophagitis: Secondary | ICD-10-CM | POA: Diagnosis not present

## 2023-11-10 DIAGNOSIS — I130 Hypertensive heart and chronic kidney disease with heart failure and stage 1 through stage 4 chronic kidney disease, or unspecified chronic kidney disease: Secondary | ICD-10-CM | POA: Diagnosis not present

## 2023-11-10 DIAGNOSIS — E782 Mixed hyperlipidemia: Secondary | ICD-10-CM | POA: Diagnosis not present

## 2023-11-10 DIAGNOSIS — I480 Paroxysmal atrial fibrillation: Secondary | ICD-10-CM | POA: Diagnosis not present

## 2023-11-10 DIAGNOSIS — M461 Sacroiliitis, not elsewhere classified: Secondary | ICD-10-CM | POA: Diagnosis not present

## 2023-11-10 DIAGNOSIS — Z79899 Other long term (current) drug therapy: Secondary | ICD-10-CM | POA: Diagnosis not present

## 2023-11-10 DIAGNOSIS — I5030 Unspecified diastolic (congestive) heart failure: Secondary | ICD-10-CM | POA: Diagnosis not present

## 2023-11-10 DIAGNOSIS — Z794 Long term (current) use of insulin: Secondary | ICD-10-CM | POA: Diagnosis not present

## 2023-11-10 NOTE — Telephone Encounter (Signed)
 This pt is Dr. Earmon Phoenix. Does he want to refill this even though we have not filled or ordered this? Please advise

## 2023-11-10 NOTE — Telephone Encounter (Signed)
*  STAT* If patient is at the pharmacy, call can be transferred to refill team.   1. Which medications need to be refilled? (please list name of each medication and dose if known) clopidogrel  (PLAVIX ) 75 MG tablet   2. Which pharmacy/location (including street and city if local pharmacy) is medication to be sent to? [facility] [place], [place] - [address] Phone: [phone]  Fax: [phone]     3. Do they need a 30 day or 90 day supply? 30

## 2023-11-11 ENCOUNTER — Telehealth: Payer: Self-pay | Admitting: Cardiovascular Disease

## 2023-11-11 MED ORDER — CLOPIDOGREL BISULFATE 75 MG PO TABS
75.0000 mg | ORAL_TABLET | Freq: Every day | ORAL | 0 refills | Status: DC
  Filled 2023-11-11 – 2023-11-12 (×2): qty 21, 21d supply, fill #0

## 2023-11-11 NOTE — Telephone Encounter (Signed)
 Pt c/o medication issue:  1. Name of Medication:   clopidogrel  (PLAVIX ) 75 MG tablet    2. How are you currently taking this medication (dosage and times per day)?    3. Are you having a reaction (difficulty breathing--STAT)? no  4. What is your medication issue? Patient has run out of medication that was giving to her in the hospital, doesn't have any to make it to her appt on 1/14. Calling to see if that will be okay or if a prescription needs to be sent over to [facility]. Please advise

## 2023-11-11 NOTE — Telephone Encounter (Signed)
Yes this is fine with me - thanks 

## 2023-11-12 ENCOUNTER — Other Ambulatory Visit (HOSPITAL_COMMUNITY): Payer: Self-pay

## 2023-11-12 ENCOUNTER — Other Ambulatory Visit: Payer: Self-pay | Admitting: Nurse Practitioner

## 2023-11-12 MED ORDER — CLOPIDOGREL BISULFATE 75 MG PO TABS: 75.0000 mg | ORAL_TABLET | Freq: Every day | ORAL | 1 refills | Status: DC

## 2023-11-12 NOTE — Telephone Encounter (Signed)
 Refill sent to pharmacy as requested and ok'd per Excell Seltzer. Called and let daughter peggy know. No questions.

## 2023-11-13 ENCOUNTER — Other Ambulatory Visit (HOSPITAL_COMMUNITY): Payer: Self-pay

## 2023-11-14 ENCOUNTER — Ambulatory Visit (INDEPENDENT_AMBULATORY_CARE_PROVIDER_SITE_OTHER): Payer: 59 | Admitting: Family Medicine

## 2023-11-14 ENCOUNTER — Encounter: Payer: Self-pay | Admitting: Family Medicine

## 2023-11-14 VITALS — BP 143/70 | HR 62 | Temp 97.0°F

## 2023-11-14 DIAGNOSIS — K219 Gastro-esophageal reflux disease without esophagitis: Secondary | ICD-10-CM | POA: Diagnosis not present

## 2023-11-14 DIAGNOSIS — M461 Sacroiliitis, not elsewhere classified: Secondary | ICD-10-CM | POA: Diagnosis not present

## 2023-11-14 DIAGNOSIS — Z8673 Personal history of transient ischemic attack (TIA), and cerebral infarction without residual deficits: Secondary | ICD-10-CM

## 2023-11-14 DIAGNOSIS — M48061 Spinal stenosis, lumbar region without neurogenic claudication: Secondary | ICD-10-CM | POA: Diagnosis not present

## 2023-11-14 DIAGNOSIS — I5030 Unspecified diastolic (congestive) heart failure: Secondary | ICD-10-CM | POA: Diagnosis not present

## 2023-11-14 DIAGNOSIS — M4802 Spinal stenosis, cervical region: Secondary | ICD-10-CM | POA: Diagnosis not present

## 2023-11-14 DIAGNOSIS — E1122 Type 2 diabetes mellitus with diabetic chronic kidney disease: Secondary | ICD-10-CM | POA: Diagnosis not present

## 2023-11-14 DIAGNOSIS — I2510 Atherosclerotic heart disease of native coronary artery without angina pectoris: Secondary | ICD-10-CM | POA: Diagnosis not present

## 2023-11-14 DIAGNOSIS — R29898 Other symptoms and signs involving the musculoskeletal system: Secondary | ICD-10-CM | POA: Diagnosis not present

## 2023-11-14 DIAGNOSIS — N1832 Chronic kidney disease, stage 3b: Secondary | ICD-10-CM | POA: Diagnosis not present

## 2023-11-14 DIAGNOSIS — I69354 Hemiplegia and hemiparesis following cerebral infarction affecting left non-dominant side: Secondary | ICD-10-CM | POA: Diagnosis not present

## 2023-11-14 DIAGNOSIS — I480 Paroxysmal atrial fibrillation: Secondary | ICD-10-CM | POA: Diagnosis not present

## 2023-11-14 DIAGNOSIS — I080 Rheumatic disorders of both mitral and aortic valves: Secondary | ICD-10-CM | POA: Diagnosis not present

## 2023-11-14 DIAGNOSIS — Z794 Long term (current) use of insulin: Secondary | ICD-10-CM | POA: Diagnosis not present

## 2023-11-14 DIAGNOSIS — E782 Mixed hyperlipidemia: Secondary | ICD-10-CM | POA: Diagnosis not present

## 2023-11-14 DIAGNOSIS — E1169 Type 2 diabetes mellitus with other specified complication: Secondary | ICD-10-CM | POA: Diagnosis not present

## 2023-11-14 DIAGNOSIS — Z7902 Long term (current) use of antithrombotics/antiplatelets: Secondary | ICD-10-CM | POA: Diagnosis not present

## 2023-11-14 DIAGNOSIS — M47816 Spondylosis without myelopathy or radiculopathy, lumbar region: Secondary | ICD-10-CM | POA: Diagnosis not present

## 2023-11-14 DIAGNOSIS — M47812 Spondylosis without myelopathy or radiculopathy, cervical region: Secondary | ICD-10-CM | POA: Diagnosis not present

## 2023-11-14 DIAGNOSIS — I130 Hypertensive heart and chronic kidney disease with heart failure and stage 1 through stage 4 chronic kidney disease, or unspecified chronic kidney disease: Secondary | ICD-10-CM | POA: Diagnosis not present

## 2023-11-14 DIAGNOSIS — Z85828 Personal history of other malignant neoplasm of skin: Secondary | ICD-10-CM | POA: Diagnosis not present

## 2023-11-14 DIAGNOSIS — K5090 Crohn's disease, unspecified, without complications: Secondary | ICD-10-CM | POA: Diagnosis not present

## 2023-11-14 DIAGNOSIS — Z79899 Other long term (current) drug therapy: Secondary | ICD-10-CM | POA: Diagnosis not present

## 2023-11-14 DIAGNOSIS — I700 Atherosclerosis of aorta: Secondary | ICD-10-CM | POA: Diagnosis not present

## 2023-11-14 DIAGNOSIS — Z7982 Long term (current) use of aspirin: Secondary | ICD-10-CM | POA: Diagnosis not present

## 2023-11-14 NOTE — Progress Notes (Signed)
 Subjective:  Patient ID: Kara Dean, female    DOB: [birth year], 89 y.o.   MRN: [id number]  Patient Care Team: Gladis Mustard, FNP as PCP - General (Nurse Practitioner) Wonda Sharper, MD as PCP - Cardiology (Cardiology) Harrie Agent, MD as Consulting Physician (Ophthalmology) Nivia Sally Alice, OD (Optometry) Porter Kelli R, PA-C as Physician Assistant (Dermatology) Frances Sharper RAMAN, LCSW as Social Worker (Licensed Clinical Social Worker) Lelon, Glendia ONEIDA RIGGERS as Physician Assistant (Cardiology)   Chief Complaint:  Weakness (Left leg weakness that was noticed this morning by PT.  Wanted her to come get checked out. )   HPI: Kara Dean is a 89 y.o. female presenting on 11/14/2023 for Weakness (Left leg weakness that was noticed this morning by PT.  Wanted her to come get checked out. )   Discussed the use of AI scribe software for clinical note transcription with the patient, who gave verbal consent to proceed.  History of Present Illness   The patient, an 89 year old with a history of a right pontine stroke in December of the previous year, presented with complaints of increased weakness in the left leg. The patient's caregiver reported that the patient had been experiencing left leg weakness and groin pain prior to the stroke. The patient had been managing well post-stroke with the aid of a walker and physical therapy, but recently experienced an episode of increased weakness after standing for an extended period. The patient was found trying to change clothes and a pad independently, which is against her usual routine. The caregiver noted that the patient's legs were shaking significantly, and the patient was unable to maintain standing. The patient did not report any pain or injury from this incident.  The patient's physical therapist, who visited a few hours after this episode, noted a slight increase in left leg weakness. However, the patient was still able to  lift both arms and legs, which was an improvement from the immediate post-stroke period when the patient was unable to do so. The patient also reported a persistent numbness in the mouth, which started three months ago after noticing a black spot on the tongue.  The patient's medication regimen includes Plavix , which she had been taking consistently until running out a few days prior to this consultation. The caregiver had been trying to refill the prescription, and the medication was due to be picked up on the day of this consultation. The patient did not report any new medications or changes in other medications.          Relevant past medical, surgical, family, and social history reviewed and updated as indicated.  Allergies and medications reviewed and updated. Data reviewed: Chart in Epic.   Past Medical History:  Diagnosis Date   Anxiety    Carotid artery disease (HCC)    Carotid US  11/2018:  bilateral ICA 1-39 >> repeat 2 years.    Cataract    Crohn's colitis (HCC)    CVA (cerebral infarction)    No deficits   Depression    Diabetes mellitus    type 2   Dyslipidemia    GERD (gastroesophageal reflux disease)    History of TIAs    no deficits   Hyperlipidemia    Hypertension    Hyponatremia    Nodular basal cell carcinoma (BCC) 04/22/2018   left prox, nasal root (tx p bx)   Osteoarthritis    SCC (squamous cell carcinoma) 04/22/2018   left shin,superior-(tx p bx), Left shin,inferior-(tx  p bx )    Past Surgical History:  Procedure Laterality Date   ABDOMINAL HYSTERECTOMY     BACK SURGERY  1994   CATARACT EXTRACTION W/PHACO Left 09/19/2017   Procedure: CATARACT EXTRACTION PHACO AND INTRAOCULAR LENS PLACEMENT (IOC);  Surgeon: Harrie Agent, MD;  Location: [facility];  Service: Ophthalmology;  Laterality: Left;  CDE: 8.10   CATARACT EXTRACTION W/PHACO Right 10/10/2017   Procedure: CATARACT EXTRACTION PHACO AND INTRAOCULAR LENS PLACEMENT RIGHT EYE;  Surgeon: Harrie Agent, MD;   Location: [facility];  Service: Ophthalmology;  Laterality: Right;  CDE: 9.60   CHOLECYSTECTOMY N/A 05/10/2021   Procedure: LAPAROSCOPIC CHOLECYSTECTOMY;  Surgeon: Signe Mitzie LABOR, MD;  Location: [facility];  Service: General;  Laterality: N/A;   COLON RESECTION  1990's   TUBAL LIGATION     VESICOVAGINAL FISTULA CLOSURE W/ TAH  1984    Social History   Socioeconomic History   Marital status: Widowed    Spouse name: Not on file   Number of children: 6   Years of education: 10   Highest education level: 10th grade  Occupational History   Occupation: Retired    Comment: [employer]  Tobacco Use   Smoking status: Never    Passive exposure: Current   Smokeless tobacco: Never  Vaping Use   Vaping status: Never Used  Substance and Sexual Activity   Alcohol use: No   Drug use: No   Sexual activity: Not Currently  Other Topics Concern   Not on file  Social History Narrative   Kara Dean is retired and widowed. She lives at home and her granddaughter lives with her. She lives within walking distance to her 2 daughter's houses. Has an indoor cat. Her sister moved to [place]  and she doesn't get to get to visit with her often but does talk with her on the phone. She has 6 children and many grandchildren and great grandchildren.    Social Drivers of Corporate Investment Banker Strain: Low Risk  (03/12/2023)   Overall Financial Resource Strain (CARDIA)    Difficulty of Paying Living Expenses: Not hard at all  Food Insecurity: Food Insecurity Present (10/18/2023)   Hunger Vital Sign    Worried About Running Out of Food in the Last Year: Sometimes true    Ran Out of Food in the Last Year: Sometimes true  Transportation Needs: No Transportation Needs (10/18/2023)   PRAPARE - Administrator, Civil Service (Medical): No    Lack of Transportation (Non-Medical): No  Physical Activity: Insufficiently Active (03/12/2023)   Exercise Vital Sign    Days of Exercise per Week: 3 days    Minutes of  Exercise per Session: 30 min  Stress: No Stress Concern Present (03/12/2023)   [employer] of Occupational Health - Occupational Stress Questionnaire    Feeling of Stress : Not at all  Social Connections: Socially Isolated (03/12/2023)   Social Connection and Isolation Panel [NHANES]    Frequency of Communication with Friends and Family: More than three times a week    Frequency of Social Gatherings with Friends and Family: More than three times a week    Attends Religious Services: Never    Database Administrator or Organizations: No    Attends Banker Meetings: Never    Marital Status: Widowed  Intimate Partner Violence: Not At Risk (10/18/2023)   Humiliation, Afraid, Rape, and Kick questionnaire    Fear of Current or Ex-Partner: No    Emotionally Abused: No  Physically Abused: No    Sexually Abused: No    Outpatient Encounter Medications as of 11/14/2023  Medication Sig   amLODipine  (NORVASC ) 10 MG tablet Take 1 tablet (10 mg total) by mouth daily.   aspirin  81 MG chewable tablet Chew 1 tablet (81 mg total) by mouth daily.   atorvastatin  (LIPITOR) 20 MG tablet Take 1 tablet (20 mg total) by mouth daily.   citalopram  (CELEXA ) 20 MG tablet Take 1 tablet (20 mg total) by mouth daily.   clopidogrel  (PLAVIX ) 75 MG tablet Take 1 tablet (75 mg total) by mouth daily.   clopidogrel  (PLAVIX ) 75 MG tablet Take 1 tablet (75 mg total) by mouth daily.   fluticasone  (FLONASE ) 50 MCG/ACT nasal spray USE 2 SPRAYS IN EACH NOSTRIL ONCE DAILY   Insulin  Pen Needle (PX EXTRA SHORT PEN NEEDLES) 31G X 6 MM MISC Use as directed   LANTUS  SOLOSTAR 100 UNIT/ML Solostar Pen Inject 40 Units into the skin daily.   LORazepam  (ATIVAN ) 0.5 MG tablet Take 1 tablet (0.5 mg total) by mouth 2 (two) times daily.   metoprolol  succinate (TOPROL -XL) 25 MG 24 hr tablet Take 1 tablet (25 mg total) by mouth daily.   olmesartan  (BENICAR ) 40 MG tablet Take 1 tablet (40 mg total) by mouth daily.   ONETOUCH  ULTRA test strip CHECK BLOOD SUGER UP TO 3 TIMES A DAY   pantoprazole  (PROTONIX ) 40 MG tablet Take 1 tablet (40 mg total) by mouth daily. (NEEDS TO BE SEEN BEFORE NEXT REFILL)   potassium chloride  (KLOR-CON ) 10 MEQ tablet Take 1 tablet (10 mEq total) by mouth daily.   tolterodine  (DETROL  LA) 4 MG 24 hr capsule Take 1 capsule (4 mg total) by mouth daily.   furosemide  (LASIX ) 20 MG tablet Take 1 tablet (20 mg total) by mouth daily. (Patient not taking: Reported on 11/14/2023)   No facility-administered encounter medications on file as of 11/14/2023.    Allergies  Allergen Reactions   Codeine Other (See Comments)    unknown   Latex Rash   Morphine Other (See Comments)    unknown   Niacin Other (See Comments)    unknown   Sulfa Antibiotics Rash   Sulfonamide Derivatives Rash    Pertinent ROS per HPI, otherwise unremarkable      Objective:  BP (!) 143/70   Pulse 62   Temp (!) 97 F (36.1 C)   SpO2 96%    Wt Readings from Last 3 Encounters:  10/18/23 124 lb (56.2 kg)  09/17/23 122 lb (55.3 kg)  09/05/23 120 lb (54.4 kg)    Physical Exam Vitals and nursing note reviewed.  Constitutional:      General: She is not in acute distress.    Appearance: She is normal weight. She is ill-appearing (chronically ill). She is not toxic-appearing or diaphoretic.  HENT:     Head: Normocephalic and atraumatic.     Nose: Nose normal.     Mouth/Throat:     Mouth: Mucous membranes are moist.  Eyes:     Conjunctiva/sclera: Conjunctivae normal.     Pupils: Pupils are equal, round, and reactive to light.  Cardiovascular:     Rate and Rhythm: Normal rate and regular rhythm.     Heart sounds: Normal heart sounds.  Pulmonary:     Effort: Pulmonary effort is normal.     Breath sounds: Normal breath sounds.  Abdominal:     General: Bowel sounds are normal.     Palpations: Abdomen is soft.  Musculoskeletal:        General: Normal range of motion.     Cervical back: Normal range of motion  and neck supple.     Right lower leg: No edema.     Left lower leg: No edema.  Skin:    General: Skin is warm and dry.     Capillary Refill: Capillary refill takes less than 2 seconds.  Neurological:     General: No focal deficit present.     Mental Status: She is alert and oriented to person, place, and time.     Cranial Nerves: Cranial nerves 2-12 are intact.     Sensory: Sensation is intact.     Motor: Motor function is intact.     Coordination: Coordination is intact.     Deep Tendon Reflexes: Reflexes are normal and symmetric.  Psychiatric:        Mood and Affect: Mood normal.        Behavior: Behavior normal.        Thought Content: Thought content normal.        Judgment: Judgment normal.    Physical Exam   NEUROLOGICAL: Extraocular movements intact, visual fields full by confrontation, facial symmetry preserved, cranial nerves grossly intact. Upper and lower extremity strength 5/5 bilaterally, no drift in extremities during strength testing. Sensation intact in both upper and lower extremities, accurate identification of touch. Coordination intact, no evidence of dysmetria, heel to shin test performed bilaterally without difficulty.        Results for orders placed or performed during the hospital encounter of 10/18/23  Comprehensive metabolic panel   Collection Time: 10/18/23  3:15 PM  Result Value Ref Range   Sodium 137 135 - 145 mmol/L   Potassium 4.3 3.5 - 5.1 mmol/L   Chloride 104 98 - 111 mmol/L   CO2 24 22 - 32 mmol/L   Glucose, Bld 207 (H) 70 - 99 mg/dL   BUN 21 8 - 23 mg/dL   Creatinine, Ser 8.78 (H) 0.44 - 1.00 mg/dL   Calcium  10.1 8.9 - 10.3 mg/dL   Total Protein 7.1 6.5 - 8.1 g/dL   Albumin 3.6 3.5 - 5.0 g/dL   AST 16 15 - 41 U/L   ALT 10 0 - 44 U/L   Alkaline Phosphatase 58 38 - 126 U/L   Total Bilirubin 0.7 <1.2 mg/dL   GFR, Estimated 43 (L) >60 mL/min   Anion gap 9 5 - 15  CBC with Differential   Collection Time: 10/18/23  3:15 PM  Result Value  Ref Range   WBC 10.7 (H) 4.0 - 10.5 K/uL   RBC 4.00 3.87 - 5.11 MIL/uL   Hemoglobin 12.6 12.0 - 15.0 g/dL   HCT 62.1 63.9 - 53.9 %   MCV 94.5 80.0 - 100.0 fL   MCH 31.5 26.0 - 34.0 pg   MCHC 33.3 30.0 - 36.0 g/dL   RDW 86.7 88.4 - 84.4 %   Platelets 191 150 - 400 K/uL   nRBC 0.0 0.0 - 0.2 %   Neutrophils Relative % 80 %   Neutro Abs 8.5 (H) 1.7 - 7.7 K/uL   Lymphocytes Relative 10 %   Lymphs Abs 1.1 0.7 - 4.0 K/uL   Monocytes Relative 8 %   Monocytes Absolute 0.8 0.1 - 1.0 K/uL   Eosinophils Relative 2 %   Eosinophils Absolute 0.2 0.0 - 0.5 K/uL   Basophils Relative 0 %   Basophils Absolute 0.0 0.0 - 0.1 K/uL  Immature Granulocytes 0 %   Abs Immature Granulocytes 0.04 0.00 - 0.07 K/uL  Hemoglobin A1c   Collection Time: 10/18/23  3:15 PM  Result Value Ref Range   Hgb A1c MFr Bld 7.3 (H) 4.8 - 5.6 %   Mean Plasma Glucose 162.81 mg/dL  Glucose, capillary   Collection Time: 10/18/23  9:19 PM  Result Value Ref Range   Glucose-Capillary 138 (H) 70 - 99 mg/dL  Lipid panel   Collection Time: 10/19/23  4:57 AM  Result Value Ref Range   Cholesterol 163 0 - 200 mg/dL   Triglycerides 836 (H) <150 mg/dL   HDL 45 >59 mg/dL   Total CHOL/HDL Ratio 3.6 RATIO   VLDL 33 0 - 40 mg/dL   LDL Cholesterol 85 0 - 99 mg/dL  Glucose, capillary   Collection Time: 10/19/23  7:25 AM  Result Value Ref Range   Glucose-Capillary 69 (L) 70 - 99 mg/dL  Glucose, capillary   Collection Time: 10/19/23  7:51 AM  Result Value Ref Range   Glucose-Capillary 69 (L) 70 - 99 mg/dL  Glucose, capillary   Collection Time: 10/19/23  9:57 AM  Result Value Ref Range   Glucose-Capillary 208 (H) 70 - 99 mg/dL  ECHOCARDIOGRAM COMPLETE   Collection Time: 10/19/23 10:36 AM  Result Value Ref Range   Weight 1,984 oz   Height 60 in   BP 183/46 mmHg   MV VTI 1.37 cm2   Area-P 1/2 1.60 cm2   S' Lateral 2.50 cm   P 1/2 time 599 msec   Est EF 60 - 65%   Glucose, capillary   Collection Time: 10/19/23 11:14 AM   Result Value Ref Range   Glucose-Capillary 196 (H) 70 - 99 mg/dL       Pertinent labs & imaging results that were available during my care of the patient were reviewed by me and considered in my medical decision making.  Assessment & Plan:  Kara Dean was seen today for weakness.  Diagnoses and all orders for this visit:  Left leg weakness History of CVA (cerebrovascular accident)    Pontine Stroke   Pontine stroke on October 18, 2023, presenting with left-sided weakness and groin pain. Currently experiencing increased left leg weakness and occasional bilateral leg shaking. No new neurological deficits observed. NIH stroke scale score is zero. Symptoms likely due to prolonged standing and muscle fatigue. Emphasized the importance of immediate hospital visit if sudden neurological symptoms occur.   - Restart Plavix    - Monitor for sudden neurological symptoms (e.g., weakness, blurred vision, confusion, facial droop) and return to the hospital if she occurs   - Continue physical therapy twice a week    Numbness of Lips   Persistent lip numbness since the appearance of a black spot on the tongue three months ago. No facial asymmetry or other concerning neurological signs observed.   - Monitor for changes or worsening of symptoms   - Report any new symptoms to the healthcare provider    General Health Maintenance   89 years old and has not had regular medical check-ups. Encouraged to maintain regular medical check-ups.   - Encourage regular medical check-ups.          Continue all other maintenance medications.  Follow up plan: Return if symptoms worsen or fail to improve.   Continue healthy lifestyle choices, including diet (rich in fruits, vegetables, and lean proteins, and low in salt and simple carbohydrates) and exercise (at least 30 minutes of moderate physical activity  daily).    The above assessment and management plan was discussed with the patient. The patient  verbalized understanding of and has agreed to the management plan. Patient is aware to call the clinic if they develop any new symptoms or if symptoms persist or worsen. Patient is aware when to return to the clinic for a follow-up visit. Patient educated on when it is appropriate to go to the emergency department.   Kara Bruns, FNP-C [facility] [phone]

## 2023-11-17 ENCOUNTER — Telehealth: Payer: Self-pay | Admitting: *Deleted

## 2023-11-17 DIAGNOSIS — K5090 Crohn's disease, unspecified, without complications: Secondary | ICD-10-CM | POA: Diagnosis not present

## 2023-11-17 DIAGNOSIS — M47812 Spondylosis without myelopathy or radiculopathy, cervical region: Secondary | ICD-10-CM | POA: Diagnosis not present

## 2023-11-17 DIAGNOSIS — I480 Paroxysmal atrial fibrillation: Secondary | ICD-10-CM | POA: Diagnosis not present

## 2023-11-17 DIAGNOSIS — M48061 Spinal stenosis, lumbar region without neurogenic claudication: Secondary | ICD-10-CM | POA: Diagnosis not present

## 2023-11-17 DIAGNOSIS — I080 Rheumatic disorders of both mitral and aortic valves: Secondary | ICD-10-CM | POA: Diagnosis not present

## 2023-11-17 DIAGNOSIS — I130 Hypertensive heart and chronic kidney disease with heart failure and stage 1 through stage 4 chronic kidney disease, or unspecified chronic kidney disease: Secondary | ICD-10-CM | POA: Diagnosis not present

## 2023-11-17 DIAGNOSIS — I69354 Hemiplegia and hemiparesis following cerebral infarction affecting left non-dominant side: Secondary | ICD-10-CM | POA: Diagnosis not present

## 2023-11-17 DIAGNOSIS — Z7902 Long term (current) use of antithrombotics/antiplatelets: Secondary | ICD-10-CM | POA: Diagnosis not present

## 2023-11-17 DIAGNOSIS — M4802 Spinal stenosis, cervical region: Secondary | ICD-10-CM | POA: Diagnosis not present

## 2023-11-17 DIAGNOSIS — K219 Gastro-esophageal reflux disease without esophagitis: Secondary | ICD-10-CM | POA: Diagnosis not present

## 2023-11-17 DIAGNOSIS — I2510 Atherosclerotic heart disease of native coronary artery without angina pectoris: Secondary | ICD-10-CM | POA: Diagnosis not present

## 2023-11-17 DIAGNOSIS — E1122 Type 2 diabetes mellitus with diabetic chronic kidney disease: Secondary | ICD-10-CM | POA: Diagnosis not present

## 2023-11-17 DIAGNOSIS — Z79899 Other long term (current) drug therapy: Secondary | ICD-10-CM | POA: Diagnosis not present

## 2023-11-17 DIAGNOSIS — I700 Atherosclerosis of aorta: Secondary | ICD-10-CM | POA: Diagnosis not present

## 2023-11-17 DIAGNOSIS — Z85828 Personal history of other malignant neoplasm of skin: Secondary | ICD-10-CM | POA: Diagnosis not present

## 2023-11-17 DIAGNOSIS — E1169 Type 2 diabetes mellitus with other specified complication: Secondary | ICD-10-CM | POA: Diagnosis not present

## 2023-11-17 DIAGNOSIS — M461 Sacroiliitis, not elsewhere classified: Secondary | ICD-10-CM | POA: Diagnosis not present

## 2023-11-17 DIAGNOSIS — M47816 Spondylosis without myelopathy or radiculopathy, lumbar region: Secondary | ICD-10-CM | POA: Diagnosis not present

## 2023-11-17 DIAGNOSIS — Z794 Long term (current) use of insulin: Secondary | ICD-10-CM | POA: Diagnosis not present

## 2023-11-17 DIAGNOSIS — N1832 Chronic kidney disease, stage 3b: Secondary | ICD-10-CM | POA: Diagnosis not present

## 2023-11-17 DIAGNOSIS — E782 Mixed hyperlipidemia: Secondary | ICD-10-CM | POA: Diagnosis not present

## 2023-11-17 DIAGNOSIS — I5030 Unspecified diastolic (congestive) heart failure: Secondary | ICD-10-CM | POA: Diagnosis not present

## 2023-11-17 DIAGNOSIS — Z7982 Long term (current) use of aspirin: Secondary | ICD-10-CM | POA: Diagnosis not present

## 2023-11-17 NOTE — Telephone Encounter (Signed)
 TC from [place] w/ PT from [facility] Reporting pt had a fall this morning, she was getting into bed, stepping up on a stool w/o her walking fell backwards and hit her head, she is feeling fine, vitals are good. She did restart her Plavix  Please advise if anything and call her family, Winton Hummer back if needed.

## 2023-11-17 NOTE — Telephone Encounter (Signed)
Daughter aware and verbalizes understanding. 

## 2023-11-17 NOTE — Telephone Encounter (Signed)
 Watch fir headache, nausea and vomiting. Any personality changes- needs to go striaght to ED

## 2023-11-18 ENCOUNTER — Other Ambulatory Visit: Payer: Self-pay | Admitting: Physician Assistant

## 2023-11-18 ENCOUNTER — Encounter: Payer: Self-pay | Admitting: Physician Assistant

## 2023-11-18 ENCOUNTER — Ambulatory Visit: Payer: 59 | Attending: Physician Assistant | Admitting: Physician Assistant

## 2023-11-18 ENCOUNTER — Ambulatory Visit (INDEPENDENT_AMBULATORY_CARE_PROVIDER_SITE_OTHER): Payer: 59

## 2023-11-18 VITALS — BP 156/56 | HR 68 | Ht 60.0 in | Wt 122.6 lb

## 2023-11-18 DIAGNOSIS — I493 Ventricular premature depolarization: Secondary | ICD-10-CM

## 2023-11-18 DIAGNOSIS — I5032 Chronic diastolic (congestive) heart failure: Secondary | ICD-10-CM | POA: Diagnosis not present

## 2023-11-18 DIAGNOSIS — I38 Endocarditis, valve unspecified: Secondary | ICD-10-CM

## 2023-11-18 DIAGNOSIS — Z8673 Personal history of transient ischemic attack (TIA), and cerebral infarction without residual deficits: Secondary | ICD-10-CM

## 2023-11-18 DIAGNOSIS — I639 Cerebral infarction, unspecified: Secondary | ICD-10-CM

## 2023-11-18 DIAGNOSIS — R002 Palpitations: Secondary | ICD-10-CM

## 2023-11-18 DIAGNOSIS — T148XXA Other injury of unspecified body region, initial encounter: Secondary | ICD-10-CM | POA: Diagnosis not present

## 2023-11-18 DIAGNOSIS — I10 Essential (primary) hypertension: Secondary | ICD-10-CM | POA: Diagnosis not present

## 2023-11-18 DIAGNOSIS — W19XXXA Unspecified fall, initial encounter: Secondary | ICD-10-CM | POA: Diagnosis not present

## 2023-11-18 NOTE — Progress Notes (Unsigned)
 [employer] 30 day cardiac event monitor serial # [id number] from office inventory applied to patient.  Dr. Excell Seltzer to read.

## 2023-11-18 NOTE — Assessment & Plan Note (Signed)
 NYHA IIb-III. Volume appears stable. EF normal on echocardiogram in the hospital. She is currently off diuretics. Continue current management.

## 2023-11-18 NOTE — Assessment & Plan Note (Signed)
 S/p recent admit for R pons stroke. She had evidence of other, small, old strokes on her MRI. She has a hx of TIA in the past. TTE showed normal EF and no cardiac source of thrombus. Neuro in the hospital recommended a 30 day monitor. Clopidogrel  and ASA were also recommended for 3 weeks, then ASA alone. The family was concerned when she ran out of Clopidogrel  and had it refilled by our office.  -I will reach out to Dr. Rosemarie to see if he is still ok with her changing to ASA alone before we tell her to stop it again -30 day event monitor. -Follow up with neuro as planned.

## 2023-11-18 NOTE — Assessment & Plan Note (Signed)
 Hx of mild MR, mild to mod AI. Recent echocardiogram with no MR, mild AI.

## 2023-11-18 NOTE — Progress Notes (Signed)
 Cardiology Office Note:    Date:  11/18/2023  ID:  Kara Dean, DOB 1933-01-29, MRN [id number] PCP: Gladis Mustard, FNP  [facility] Providers Cardiologist:  Ozell Fell, MD Cardiology APP:  Lelon Glendia DASEN, PA-C       Patient Profile:      (HFpEF) heart failure with preserved ejection fraction  Mild to mod AI Mild MR  TTE 04/14/20: EF 60-65, no RWMA, GRII DD, normal RVSF, RVSP 69.5, moderate LAE, mild MR, mild-moderate AI, AV sclerosis without stenosis  TTE 12/19/21: EF 60-65, mild MR, mod TR, mod AI TTE 10/19/23: EF 60-65, no RWMA, mod LVH, Gr 1 DD, NL RVSF, NL PASP, mild LAE, no MR, mild AI, AV sclerosis, RAP 3  Longstanding chest pain  Myoview  02/2013: Normal stress nuclear study.  LV Ejection Fraction: 68%.  Myoview  05/09/21: no ischemia  Myoview  10/07/23: EF 54, normal  PVCs Monitor 08/2022: 6% burden Hypertension  Hyperlipidemia  Diabetes mellitus  Hx of TIA S/p CVA 10/2023  Carotid artery disease US  11/11/18: Bilat ICA 1-39  US  06/20/21: L < 50         History of Present Illness:  Discussed the use of AI scribe software for clinical note transcription with the patient, who gave verbal consent to proceed.  Kara Dean is a 89 y.o. female who returns for post hospital follow up. She was last seen by Dr. Fell in 08/2023. She noted recurrent chest pain and a Myoview  was obtained. This was neg for ischemia. She was admitted in 10/2023 with acute ischemic CVA. MRI showed R pons infarct. Neck CTA did not show hemodynamically significant stenosis. EF was normal on TTE. DAPT x 3 weeks with ASA and Clopidogrel  was initiated by Neuro. She was to remain on ASA only after 3 weeks. OP cardiac event monitor was recommended. Metoprolol , Olmesartan  and Amlodipine  were held at DC to allow for permissive HTN. She is here with her daughter and granddaughter. The patient's stroke has affected her left side, leading to difficulties with walking and using her left arm.  Despite physical therapy, the patient's recovery has been complicated by falls at home, as she attempts to move without assistance. The patient's family reports that her blood pressure has been running normal at home with systolic BPs in the 120s. The patient is currently on all her blood pressure medications. The patient reports shortness of breath, which has been ongoing for a while. This seems to be stable. The patient's family expressed confusion about the duration of the Plavix  treatment and whether it should be continued beyond the initial three weeks.     Review of Systems  Gastrointestinal:  Negative for hematochezia and melena.  Genitourinary:  Negative for hematuria.  -See HPI    Studies Reviewed:                Risk Assessment/Calculations:     HYPERTENSION CONTROL Vitals:   11/18/23 1353 11/18/23 1429  BP: (!) 170/50 (!) 156/56    The patient's blood pressure is elevated above target today.  In order to address the patient's elevated BP: Blood pressure will be monitored at home to determine if medication changes need to be made.          Physical Exam:   VS:  BP (!) 156/56   Pulse 68   Ht 5' (1.524 m)   Wt 122 lb 9.6 oz (55.6 kg)   SpO2 98%   BMI 23.94 kg/m    Wt Readings  from Last 3 Encounters:  11/18/23 122 lb 9.6 oz (55.6 kg)  10/18/23 124 lb (56.2 kg)  09/17/23 122 lb (55.3 kg)    Constitutional:      Appearance: Not in distress. Frail.  Neck:     Vascular: JVD normal.  Pulmonary:     Breath sounds: Normal breath sounds. No wheezing. No rales.  Cardiovascular:     Normal rate. Regularly irregular rhythm.     Murmurs: There is no murmur.  Edema:    Peripheral edema absent.  Abdominal:     Palpations: Abdomen is soft.         Assessment and Plan:   Assessment & Plan History of stroke S/p recent admit for R pons stroke. She had evidence of other, small, old strokes on her MRI. She has a hx of TIA in the past. TTE showed normal EF and no cardiac  source of thrombus. Neuro in the hospital recommended a 30 day monitor. Clopidogrel  and ASA were also recommended for 3 weeks, then ASA alone. The family was concerned when she ran out of Clopidogrel  and had it refilled by our office.  -I will reach out to Dr. Rosemarie to see if he is still ok with her changing to ASA alone before we tell her to stop it again -30 day event monitor. -Follow up with neuro as planned.   Chronic heart failure with preserved ejection fraction (HCC) NYHA IIb-III. Volume appears stable. EF normal on echocardiogram in the hospital. She is currently off diuretics. Continue current management.    Essential hypertension BP elevated today. She notes optimal BPs at home. She is back on her HTN meds. Continue Norvasc  10 mg once daily, Metoprolol  succinate 25 mg once daily, Olmesartan  40 mg once daily.  Valvular heart disease Hx of mild MR, mild to mod AI. Recent echocardiogram with no MR, mild AI.         Dispo:  Return in about 4 months (around 03/17/2024) for Routine Follow Up, w/ Dr. Wonda, or Glendia Ferrier, PA-C.  Signed, Glendia Ferrier, PA-C

## 2023-11-18 NOTE — Patient Instructions (Addendum)
 Medication Instructions:  Your physician recommends that you continue on your current medications as directed. Please refer to the Current Medication list given to you today.  *If you need a refill on your cardiac medications before your next appointment, please call your pharmacy*   Lab Work: None ordered  If you have labs (blood work) drawn today and your tests are completely normal, you will receive your results only by: MyChart Message (if you have MyChart) OR A paper copy in the mail If you have any lab test that is abnormal or we need to change your treatment, we will call you to review the results.   Testing/Procedures: [employer] Cardiac Event Monitor Instructions  Your physician has requested you wear your cardiac event monitor for _____ days, (1-30). [employer] may call or text to confirm a shipping address. The monitor will be sent to a land address via UPS. [employer] will not ship a monitor to a PO BOX. It typically takes 3-5 days to receive your monitor after it has been enrolled. [employer] will assist with USPS tracking if your package is delayed. The telephone number for [employer] is [phone]. Once you have received your monitor, please review the enclosed instructions. Instruction tutorials can also be viewed under help and settings on the enclosed cell phone. Your monitor has already been registered assigning a specific monitor serial # to you.  Billing and Self Pay Discount Information  [employer] has been provided the insurance information we had on file for you.  If your insurance has been updated, please call [employer] at [phone] to provide them with your updated insurance information.   [employer] offers a discounted Self Pay option for patients who have insurance that does not cover their cardiac event monitor or patients without insurance.  The discounted cost of a Self Pay Cardiac Event Monitor would be $225.00 , if the patient contacts [employer]  at [phone] within 7 days of applying the monitor to make payment arrangements.  If the patient does not contact [employer] within 7 days of applying the monitor, the cost of the cardiac event monitor will be $350.00.  Applying the monitor  Remove cell phone from case and turn it on. The cell phone works as it consultant and needs to be within [employer] of you at all times. The cell phone will need to be charged on a daily basis. We recommend you plug the cell phone into the enclosed charger at your bedside table every night.  Monitor batteries: You will receive two monitor batteries labelled #1 and #2. These are your recorders. Plug battery #2 onto the second connection on the enclosed charger. Keep one battery on the charger at all times. This will keep the monitor battery deactivated. It will also keep it fully charged for when you need to switch your monitor batteries. A small light will be blinking on the battery emblem when it is charging. The light on the battery emblem will remain on when the battery is fully charged.  Open package of a Monitor strip. Insert battery #1 into black hood on strip and gently squeeze monitor battery onto connection as indicated in instruction booklet. Set aside while preparing skin.  Choose location for your strip, vertical or horizontal, as indicated in the instruction booklet. Shave to remove all hair from location. There cannot be any lotions, oils, powders, or colognes on skin where monitor is to be applied. Wipe skin clean with enclosed Saline wipe. Dry skin completely.  Peel paper labeled #1 off the  back of the Monitor strip exposing the adhesive. Place the monitor on the chest in the vertical or horizontal position shown in the instruction booklet. One arrow on the monitor strip must be pointing upward. Carefully remove paper labeled #2, attaching remainder of strip to your skin. Try not to create any folds or wrinkles in the strip as you apply  it.  Firmly press and release the circle in the center of the monitor battery. You will hear a small beep. This is turning the monitor battery on. The heart emblem on the monitor battery will light up every 5 seconds if the monitor battery in turned on and connected to the patient securely. Do not push and hold the circle down as this turns the monitor battery off. The cell phone will locate the monitor battery. A screen will appear on the cell phone checking the connection of your monitor strip. This may read poor connection initially but change to good connection within the next minute. Once your monitor accepts the connection you will hear a series of 3 beeps followed by a climbing crescendo of beeps. A screen will appear on the cell phone showing the two monitor strip placement options. Touch the picture that demonstrates where you applied the monitor strip.  Your monitor strip and battery are waterproof. You are able to shower, bathe, or swim with the monitor on. They just ask you do not submerge deeper than 3 feet underwater. We recommend removing the monitor if you are swimming in a lake, river, or ocean.  Your monitor battery will need to be switched to a fully charged monitor battery approximately once a week. The cell phone will alert you of an action which needs to be made.  On the cell phone, tap for details to reveal connection status, monitor battery status, and cell phone battery status. The green dots indicates your monitor is in good status. A red dot indicates there is something that needs your attention.  To record a symptom, click the circle on the monitor battery. In 30-60 seconds a list of symptoms will appear on the cell phone. Select your symptom and tap save. Your monitor will record a sustained or significant arrhythmia regardless of you clicking the button. Some patients do not feel the heart rhythm irregularities. [employer] will notify us  of any serious or  critical events.  Refer to instruction booklet for instructions on switching batteries, changing strips, the Do not disturb or Pause features, or any additional questions.  Call [employer] at [phone], to confirm your monitor is transmitting and record your baseline. They will answer any questions you may have regarding the monitor instructions at that time.  Returning the monitor to [employer]  Place all equipment back into blue box. Peel off strip of paper to expose adhesive and close box securely. There is a prepaid UPS shipping label on this box. Drop in a UPS drop box, or at a UPS facility like [employer]. You may also contact [employer] to arrange UPS to pick up monitor package at your home.    Follow-Up: At [facility], you and your health needs are our priority.  As part of our continuing mission to provide you with exceptional heart care, we have created designated Provider Care Teams.  These Care Teams include your primary Cardiologist (physician) and Advanced Practice Providers (APPs -  Physician Assistants and Nurse Practitioners) who all work together to provide you with the care you need, when you need it.  We recommend signing up  for the patient portal called MyChart.  Sign up information is provided on this After Visit Summary.  MyChart is used to connect with patients for Virtual Visits (Telemedicine).  Patients are able to view lab/test results, encounter notes, upcoming appointments, etc.  Non-urgent messages can be sent to your provider as well.   To learn more about what you can do with MyChart, go to [email or web address].    Your next appointment:   4 month(s)  Provider:   Ozell Fell, MD     Other Instructions

## 2023-11-18 NOTE — Assessment & Plan Note (Signed)
 BP elevated today. She notes optimal BPs at home. She is back on her HTN meds. Continue Norvasc 10 mg once daily, Metoprolol succinate 25 mg once daily, Olmesartan 40 mg once daily.

## 2023-11-21 DIAGNOSIS — I700 Atherosclerosis of aorta: Secondary | ICD-10-CM | POA: Diagnosis not present

## 2023-11-21 DIAGNOSIS — Z7982 Long term (current) use of aspirin: Secondary | ICD-10-CM | POA: Diagnosis not present

## 2023-11-21 DIAGNOSIS — I2510 Atherosclerotic heart disease of native coronary artery without angina pectoris: Secondary | ICD-10-CM | POA: Diagnosis not present

## 2023-11-21 DIAGNOSIS — I130 Hypertensive heart and chronic kidney disease with heart failure and stage 1 through stage 4 chronic kidney disease, or unspecified chronic kidney disease: Secondary | ICD-10-CM | POA: Diagnosis not present

## 2023-11-21 DIAGNOSIS — I69354 Hemiplegia and hemiparesis following cerebral infarction affecting left non-dominant side: Secondary | ICD-10-CM | POA: Diagnosis not present

## 2023-11-21 DIAGNOSIS — M48061 Spinal stenosis, lumbar region without neurogenic claudication: Secondary | ICD-10-CM | POA: Diagnosis not present

## 2023-11-21 DIAGNOSIS — I480 Paroxysmal atrial fibrillation: Secondary | ICD-10-CM | POA: Diagnosis not present

## 2023-11-21 DIAGNOSIS — K5090 Crohn's disease, unspecified, without complications: Secondary | ICD-10-CM | POA: Diagnosis not present

## 2023-11-21 DIAGNOSIS — M47812 Spondylosis without myelopathy or radiculopathy, cervical region: Secondary | ICD-10-CM | POA: Diagnosis not present

## 2023-11-21 DIAGNOSIS — E782 Mixed hyperlipidemia: Secondary | ICD-10-CM | POA: Diagnosis not present

## 2023-11-21 DIAGNOSIS — M4802 Spinal stenosis, cervical region: Secondary | ICD-10-CM | POA: Diagnosis not present

## 2023-11-21 DIAGNOSIS — E1169 Type 2 diabetes mellitus with other specified complication: Secondary | ICD-10-CM | POA: Diagnosis not present

## 2023-11-21 DIAGNOSIS — I080 Rheumatic disorders of both mitral and aortic valves: Secondary | ICD-10-CM | POA: Diagnosis not present

## 2023-11-21 DIAGNOSIS — E1122 Type 2 diabetes mellitus with diabetic chronic kidney disease: Secondary | ICD-10-CM | POA: Diagnosis not present

## 2023-11-21 DIAGNOSIS — M47816 Spondylosis without myelopathy or radiculopathy, lumbar region: Secondary | ICD-10-CM | POA: Diagnosis not present

## 2023-11-21 DIAGNOSIS — N1832 Chronic kidney disease, stage 3b: Secondary | ICD-10-CM | POA: Diagnosis not present

## 2023-11-21 DIAGNOSIS — K219 Gastro-esophageal reflux disease without esophagitis: Secondary | ICD-10-CM | POA: Diagnosis not present

## 2023-11-21 DIAGNOSIS — Z79899 Other long term (current) drug therapy: Secondary | ICD-10-CM | POA: Diagnosis not present

## 2023-11-21 DIAGNOSIS — Z85828 Personal history of other malignant neoplasm of skin: Secondary | ICD-10-CM | POA: Diagnosis not present

## 2023-11-21 DIAGNOSIS — M461 Sacroiliitis, not elsewhere classified: Secondary | ICD-10-CM | POA: Diagnosis not present

## 2023-11-21 DIAGNOSIS — I5030 Unspecified diastolic (congestive) heart failure: Secondary | ICD-10-CM | POA: Diagnosis not present

## 2023-11-21 DIAGNOSIS — Z794 Long term (current) use of insulin: Secondary | ICD-10-CM | POA: Diagnosis not present

## 2023-11-21 DIAGNOSIS — Z7902 Long term (current) use of antithrombotics/antiplatelets: Secondary | ICD-10-CM | POA: Diagnosis not present

## 2023-11-24 ENCOUNTER — Telehealth: Payer: Self-pay

## 2023-11-24 DIAGNOSIS — Z79899 Other long term (current) drug therapy: Secondary | ICD-10-CM | POA: Diagnosis not present

## 2023-11-24 DIAGNOSIS — M48061 Spinal stenosis, lumbar region without neurogenic claudication: Secondary | ICD-10-CM | POA: Diagnosis not present

## 2023-11-24 DIAGNOSIS — Z7982 Long term (current) use of aspirin: Secondary | ICD-10-CM | POA: Diagnosis not present

## 2023-11-24 DIAGNOSIS — M4802 Spinal stenosis, cervical region: Secondary | ICD-10-CM | POA: Diagnosis not present

## 2023-11-24 DIAGNOSIS — E1122 Type 2 diabetes mellitus with diabetic chronic kidney disease: Secondary | ICD-10-CM | POA: Diagnosis not present

## 2023-11-24 DIAGNOSIS — M47816 Spondylosis without myelopathy or radiculopathy, lumbar region: Secondary | ICD-10-CM | POA: Diagnosis not present

## 2023-11-24 DIAGNOSIS — I2510 Atherosclerotic heart disease of native coronary artery without angina pectoris: Secondary | ICD-10-CM | POA: Diagnosis not present

## 2023-11-24 DIAGNOSIS — N1832 Chronic kidney disease, stage 3b: Secondary | ICD-10-CM | POA: Diagnosis not present

## 2023-11-24 DIAGNOSIS — I080 Rheumatic disorders of both mitral and aortic valves: Secondary | ICD-10-CM | POA: Diagnosis not present

## 2023-11-24 DIAGNOSIS — I700 Atherosclerosis of aorta: Secondary | ICD-10-CM | POA: Diagnosis not present

## 2023-11-24 DIAGNOSIS — I480 Paroxysmal atrial fibrillation: Secondary | ICD-10-CM | POA: Diagnosis not present

## 2023-11-24 DIAGNOSIS — M47812 Spondylosis without myelopathy or radiculopathy, cervical region: Secondary | ICD-10-CM | POA: Diagnosis not present

## 2023-11-24 DIAGNOSIS — I130 Hypertensive heart and chronic kidney disease with heart failure and stage 1 through stage 4 chronic kidney disease, or unspecified chronic kidney disease: Secondary | ICD-10-CM | POA: Diagnosis not present

## 2023-11-24 DIAGNOSIS — E782 Mixed hyperlipidemia: Secondary | ICD-10-CM | POA: Diagnosis not present

## 2023-11-24 DIAGNOSIS — I69354 Hemiplegia and hemiparesis following cerebral infarction affecting left non-dominant side: Secondary | ICD-10-CM | POA: Diagnosis not present

## 2023-11-24 DIAGNOSIS — I5030 Unspecified diastolic (congestive) heart failure: Secondary | ICD-10-CM | POA: Diagnosis not present

## 2023-11-24 DIAGNOSIS — Z85828 Personal history of other malignant neoplasm of skin: Secondary | ICD-10-CM | POA: Diagnosis not present

## 2023-11-24 DIAGNOSIS — K219 Gastro-esophageal reflux disease without esophagitis: Secondary | ICD-10-CM | POA: Diagnosis not present

## 2023-11-24 DIAGNOSIS — K5090 Crohn's disease, unspecified, without complications: Secondary | ICD-10-CM | POA: Diagnosis not present

## 2023-11-24 DIAGNOSIS — Z794 Long term (current) use of insulin: Secondary | ICD-10-CM | POA: Diagnosis not present

## 2023-11-24 DIAGNOSIS — M461 Sacroiliitis, not elsewhere classified: Secondary | ICD-10-CM | POA: Diagnosis not present

## 2023-11-24 DIAGNOSIS — E1169 Type 2 diabetes mellitus with other specified complication: Secondary | ICD-10-CM | POA: Diagnosis not present

## 2023-11-24 DIAGNOSIS — Z7902 Long term (current) use of antithrombotics/antiplatelets: Secondary | ICD-10-CM | POA: Diagnosis not present

## 2023-11-24 NOTE — Telephone Encounter (Signed)
Spoke with patient's daughter (per DPR) and notified her that patient can stop taking Plavix and continue taking the Aspirin per [employer] instructions.

## 2023-11-24 NOTE — Telephone Encounter (Signed)
-----   Message from Tereso Newcomer sent at 11/24/2023 10:21 AM EST ----- Please call pt and tell her that I did check with Dr. Pearlean Brownie (neurology) and he agreed that she can stop the Plavix now.  -Stop Plavix  -Continue ASA  Tereso Newcomer, PA-C    11/24/2023 10:24 AM

## 2023-11-26 DIAGNOSIS — N1832 Chronic kidney disease, stage 3b: Secondary | ICD-10-CM | POA: Diagnosis not present

## 2023-11-26 DIAGNOSIS — Z85828 Personal history of other malignant neoplasm of skin: Secondary | ICD-10-CM | POA: Diagnosis not present

## 2023-11-26 DIAGNOSIS — Z7902 Long term (current) use of antithrombotics/antiplatelets: Secondary | ICD-10-CM | POA: Diagnosis not present

## 2023-11-26 DIAGNOSIS — K219 Gastro-esophageal reflux disease without esophagitis: Secondary | ICD-10-CM | POA: Diagnosis not present

## 2023-11-26 DIAGNOSIS — I2510 Atherosclerotic heart disease of native coronary artery without angina pectoris: Secondary | ICD-10-CM | POA: Diagnosis not present

## 2023-11-26 DIAGNOSIS — M461 Sacroiliitis, not elsewhere classified: Secondary | ICD-10-CM | POA: Diagnosis not present

## 2023-11-26 DIAGNOSIS — M4802 Spinal stenosis, cervical region: Secondary | ICD-10-CM | POA: Diagnosis not present

## 2023-11-26 DIAGNOSIS — E782 Mixed hyperlipidemia: Secondary | ICD-10-CM | POA: Diagnosis not present

## 2023-11-26 DIAGNOSIS — I700 Atherosclerosis of aorta: Secondary | ICD-10-CM | POA: Diagnosis not present

## 2023-11-26 DIAGNOSIS — I69354 Hemiplegia and hemiparesis following cerebral infarction affecting left non-dominant side: Secondary | ICD-10-CM | POA: Diagnosis not present

## 2023-11-26 DIAGNOSIS — E1122 Type 2 diabetes mellitus with diabetic chronic kidney disease: Secondary | ICD-10-CM | POA: Diagnosis not present

## 2023-11-26 DIAGNOSIS — M48061 Spinal stenosis, lumbar region without neurogenic claudication: Secondary | ICD-10-CM | POA: Diagnosis not present

## 2023-11-26 DIAGNOSIS — I5030 Unspecified diastolic (congestive) heart failure: Secondary | ICD-10-CM | POA: Diagnosis not present

## 2023-11-26 DIAGNOSIS — E1169 Type 2 diabetes mellitus with other specified complication: Secondary | ICD-10-CM | POA: Diagnosis not present

## 2023-11-26 DIAGNOSIS — I480 Paroxysmal atrial fibrillation: Secondary | ICD-10-CM | POA: Diagnosis not present

## 2023-11-26 DIAGNOSIS — I080 Rheumatic disorders of both mitral and aortic valves: Secondary | ICD-10-CM | POA: Diagnosis not present

## 2023-11-26 DIAGNOSIS — M47812 Spondylosis without myelopathy or radiculopathy, cervical region: Secondary | ICD-10-CM | POA: Diagnosis not present

## 2023-11-26 DIAGNOSIS — Z794 Long term (current) use of insulin: Secondary | ICD-10-CM | POA: Diagnosis not present

## 2023-11-26 DIAGNOSIS — M47816 Spondylosis without myelopathy or radiculopathy, lumbar region: Secondary | ICD-10-CM | POA: Diagnosis not present

## 2023-11-26 DIAGNOSIS — I130 Hypertensive heart and chronic kidney disease with heart failure and stage 1 through stage 4 chronic kidney disease, or unspecified chronic kidney disease: Secondary | ICD-10-CM | POA: Diagnosis not present

## 2023-11-26 DIAGNOSIS — Z79899 Other long term (current) drug therapy: Secondary | ICD-10-CM | POA: Diagnosis not present

## 2023-11-26 DIAGNOSIS — K5090 Crohn's disease, unspecified, without complications: Secondary | ICD-10-CM | POA: Diagnosis not present

## 2023-11-26 DIAGNOSIS — Z7982 Long term (current) use of aspirin: Secondary | ICD-10-CM | POA: Diagnosis not present

## 2023-12-01 DIAGNOSIS — E1169 Type 2 diabetes mellitus with other specified complication: Secondary | ICD-10-CM | POA: Diagnosis not present

## 2023-12-01 DIAGNOSIS — N1832 Chronic kidney disease, stage 3b: Secondary | ICD-10-CM | POA: Diagnosis not present

## 2023-12-01 DIAGNOSIS — M461 Sacroiliitis, not elsewhere classified: Secondary | ICD-10-CM | POA: Diagnosis not present

## 2023-12-01 DIAGNOSIS — M47816 Spondylosis without myelopathy or radiculopathy, lumbar region: Secondary | ICD-10-CM | POA: Diagnosis not present

## 2023-12-01 DIAGNOSIS — K5090 Crohn's disease, unspecified, without complications: Secondary | ICD-10-CM | POA: Diagnosis not present

## 2023-12-01 DIAGNOSIS — K219 Gastro-esophageal reflux disease without esophagitis: Secondary | ICD-10-CM | POA: Diagnosis not present

## 2023-12-01 DIAGNOSIS — M47812 Spondylosis without myelopathy or radiculopathy, cervical region: Secondary | ICD-10-CM | POA: Diagnosis not present

## 2023-12-01 DIAGNOSIS — I480 Paroxysmal atrial fibrillation: Secondary | ICD-10-CM | POA: Diagnosis not present

## 2023-12-01 DIAGNOSIS — M4802 Spinal stenosis, cervical region: Secondary | ICD-10-CM | POA: Diagnosis not present

## 2023-12-01 DIAGNOSIS — I700 Atherosclerosis of aorta: Secondary | ICD-10-CM | POA: Diagnosis not present

## 2023-12-01 DIAGNOSIS — E1122 Type 2 diabetes mellitus with diabetic chronic kidney disease: Secondary | ICD-10-CM | POA: Diagnosis not present

## 2023-12-01 DIAGNOSIS — I2510 Atherosclerotic heart disease of native coronary artery without angina pectoris: Secondary | ICD-10-CM | POA: Diagnosis not present

## 2023-12-01 DIAGNOSIS — I5030 Unspecified diastolic (congestive) heart failure: Secondary | ICD-10-CM | POA: Diagnosis not present

## 2023-12-01 DIAGNOSIS — Z7982 Long term (current) use of aspirin: Secondary | ICD-10-CM | POA: Diagnosis not present

## 2023-12-01 DIAGNOSIS — Z794 Long term (current) use of insulin: Secondary | ICD-10-CM | POA: Diagnosis not present

## 2023-12-01 DIAGNOSIS — Z79899 Other long term (current) drug therapy: Secondary | ICD-10-CM | POA: Diagnosis not present

## 2023-12-01 DIAGNOSIS — I080 Rheumatic disorders of both mitral and aortic valves: Secondary | ICD-10-CM | POA: Diagnosis not present

## 2023-12-01 DIAGNOSIS — Z7902 Long term (current) use of antithrombotics/antiplatelets: Secondary | ICD-10-CM | POA: Diagnosis not present

## 2023-12-01 DIAGNOSIS — I130 Hypertensive heart and chronic kidney disease with heart failure and stage 1 through stage 4 chronic kidney disease, or unspecified chronic kidney disease: Secondary | ICD-10-CM | POA: Diagnosis not present

## 2023-12-01 DIAGNOSIS — Z85828 Personal history of other malignant neoplasm of skin: Secondary | ICD-10-CM | POA: Diagnosis not present

## 2023-12-01 DIAGNOSIS — I69354 Hemiplegia and hemiparesis following cerebral infarction affecting left non-dominant side: Secondary | ICD-10-CM | POA: Diagnosis not present

## 2023-12-01 DIAGNOSIS — E782 Mixed hyperlipidemia: Secondary | ICD-10-CM | POA: Diagnosis not present

## 2023-12-01 DIAGNOSIS — M48061 Spinal stenosis, lumbar region without neurogenic claudication: Secondary | ICD-10-CM | POA: Diagnosis not present

## 2023-12-05 DIAGNOSIS — M48061 Spinal stenosis, lumbar region without neurogenic claudication: Secondary | ICD-10-CM | POA: Diagnosis not present

## 2023-12-05 DIAGNOSIS — I700 Atherosclerosis of aorta: Secondary | ICD-10-CM | POA: Diagnosis not present

## 2023-12-05 DIAGNOSIS — M461 Sacroiliitis, not elsewhere classified: Secondary | ICD-10-CM | POA: Diagnosis not present

## 2023-12-05 DIAGNOSIS — Z7982 Long term (current) use of aspirin: Secondary | ICD-10-CM | POA: Diagnosis not present

## 2023-12-05 DIAGNOSIS — Z79899 Other long term (current) drug therapy: Secondary | ICD-10-CM | POA: Diagnosis not present

## 2023-12-05 DIAGNOSIS — Z794 Long term (current) use of insulin: Secondary | ICD-10-CM | POA: Diagnosis not present

## 2023-12-05 DIAGNOSIS — Z85828 Personal history of other malignant neoplasm of skin: Secondary | ICD-10-CM | POA: Diagnosis not present

## 2023-12-05 DIAGNOSIS — I130 Hypertensive heart and chronic kidney disease with heart failure and stage 1 through stage 4 chronic kidney disease, or unspecified chronic kidney disease: Secondary | ICD-10-CM | POA: Diagnosis not present

## 2023-12-05 DIAGNOSIS — K219 Gastro-esophageal reflux disease without esophagitis: Secondary | ICD-10-CM | POA: Diagnosis not present

## 2023-12-05 DIAGNOSIS — I2510 Atherosclerotic heart disease of native coronary artery without angina pectoris: Secondary | ICD-10-CM | POA: Diagnosis not present

## 2023-12-05 DIAGNOSIS — E782 Mixed hyperlipidemia: Secondary | ICD-10-CM | POA: Diagnosis not present

## 2023-12-05 DIAGNOSIS — I480 Paroxysmal atrial fibrillation: Secondary | ICD-10-CM | POA: Diagnosis not present

## 2023-12-05 DIAGNOSIS — M47816 Spondylosis without myelopathy or radiculopathy, lumbar region: Secondary | ICD-10-CM | POA: Diagnosis not present

## 2023-12-05 DIAGNOSIS — I5030 Unspecified diastolic (congestive) heart failure: Secondary | ICD-10-CM | POA: Diagnosis not present

## 2023-12-05 DIAGNOSIS — E1169 Type 2 diabetes mellitus with other specified complication: Secondary | ICD-10-CM | POA: Diagnosis not present

## 2023-12-05 DIAGNOSIS — I080 Rheumatic disorders of both mitral and aortic valves: Secondary | ICD-10-CM | POA: Diagnosis not present

## 2023-12-05 DIAGNOSIS — N1832 Chronic kidney disease, stage 3b: Secondary | ICD-10-CM | POA: Diagnosis not present

## 2023-12-05 DIAGNOSIS — M4802 Spinal stenosis, cervical region: Secondary | ICD-10-CM | POA: Diagnosis not present

## 2023-12-05 DIAGNOSIS — K5090 Crohn's disease, unspecified, without complications: Secondary | ICD-10-CM | POA: Diagnosis not present

## 2023-12-05 DIAGNOSIS — Z7902 Long term (current) use of antithrombotics/antiplatelets: Secondary | ICD-10-CM | POA: Diagnosis not present

## 2023-12-05 DIAGNOSIS — E1122 Type 2 diabetes mellitus with diabetic chronic kidney disease: Secondary | ICD-10-CM | POA: Diagnosis not present

## 2023-12-05 DIAGNOSIS — M47812 Spondylosis without myelopathy or radiculopathy, cervical region: Secondary | ICD-10-CM | POA: Diagnosis not present

## 2023-12-05 DIAGNOSIS — I69354 Hemiplegia and hemiparesis following cerebral infarction affecting left non-dominant side: Secondary | ICD-10-CM | POA: Diagnosis not present

## 2023-12-09 DIAGNOSIS — E782 Mixed hyperlipidemia: Secondary | ICD-10-CM | POA: Diagnosis not present

## 2023-12-09 DIAGNOSIS — E1169 Type 2 diabetes mellitus with other specified complication: Secondary | ICD-10-CM | POA: Diagnosis not present

## 2023-12-09 DIAGNOSIS — M4802 Spinal stenosis, cervical region: Secondary | ICD-10-CM | POA: Diagnosis not present

## 2023-12-09 DIAGNOSIS — I5030 Unspecified diastolic (congestive) heart failure: Secondary | ICD-10-CM | POA: Diagnosis not present

## 2023-12-09 DIAGNOSIS — E1122 Type 2 diabetes mellitus with diabetic chronic kidney disease: Secondary | ICD-10-CM | POA: Diagnosis not present

## 2023-12-09 DIAGNOSIS — K5090 Crohn's disease, unspecified, without complications: Secondary | ICD-10-CM | POA: Diagnosis not present

## 2023-12-09 DIAGNOSIS — Z7982 Long term (current) use of aspirin: Secondary | ICD-10-CM | POA: Diagnosis not present

## 2023-12-09 DIAGNOSIS — I480 Paroxysmal atrial fibrillation: Secondary | ICD-10-CM | POA: Diagnosis not present

## 2023-12-09 DIAGNOSIS — I69354 Hemiplegia and hemiparesis following cerebral infarction affecting left non-dominant side: Secondary | ICD-10-CM | POA: Diagnosis not present

## 2023-12-09 DIAGNOSIS — M47812 Spondylosis without myelopathy or radiculopathy, cervical region: Secondary | ICD-10-CM | POA: Diagnosis not present

## 2023-12-09 DIAGNOSIS — I080 Rheumatic disorders of both mitral and aortic valves: Secondary | ICD-10-CM | POA: Diagnosis not present

## 2023-12-09 DIAGNOSIS — K219 Gastro-esophageal reflux disease without esophagitis: Secondary | ICD-10-CM | POA: Diagnosis not present

## 2023-12-09 DIAGNOSIS — M461 Sacroiliitis, not elsewhere classified: Secondary | ICD-10-CM | POA: Diagnosis not present

## 2023-12-09 DIAGNOSIS — I700 Atherosclerosis of aorta: Secondary | ICD-10-CM | POA: Diagnosis not present

## 2023-12-09 DIAGNOSIS — Z85828 Personal history of other malignant neoplasm of skin: Secondary | ICD-10-CM | POA: Diagnosis not present

## 2023-12-09 DIAGNOSIS — Z79899 Other long term (current) drug therapy: Secondary | ICD-10-CM | POA: Diagnosis not present

## 2023-12-09 DIAGNOSIS — Z7902 Long term (current) use of antithrombotics/antiplatelets: Secondary | ICD-10-CM | POA: Diagnosis not present

## 2023-12-09 DIAGNOSIS — M47816 Spondylosis without myelopathy or radiculopathy, lumbar region: Secondary | ICD-10-CM | POA: Diagnosis not present

## 2023-12-09 DIAGNOSIS — N1832 Chronic kidney disease, stage 3b: Secondary | ICD-10-CM | POA: Diagnosis not present

## 2023-12-09 DIAGNOSIS — I2510 Atherosclerotic heart disease of native coronary artery without angina pectoris: Secondary | ICD-10-CM | POA: Diagnosis not present

## 2023-12-09 DIAGNOSIS — I130 Hypertensive heart and chronic kidney disease with heart failure and stage 1 through stage 4 chronic kidney disease, or unspecified chronic kidney disease: Secondary | ICD-10-CM | POA: Diagnosis not present

## 2023-12-09 DIAGNOSIS — Z794 Long term (current) use of insulin: Secondary | ICD-10-CM | POA: Diagnosis not present

## 2023-12-09 DIAGNOSIS — M48061 Spinal stenosis, lumbar region without neurogenic claudication: Secondary | ICD-10-CM | POA: Diagnosis not present

## 2023-12-11 DIAGNOSIS — I080 Rheumatic disorders of both mitral and aortic valves: Secondary | ICD-10-CM | POA: Diagnosis not present

## 2023-12-11 DIAGNOSIS — M48061 Spinal stenosis, lumbar region without neurogenic claudication: Secondary | ICD-10-CM | POA: Diagnosis not present

## 2023-12-11 DIAGNOSIS — E1122 Type 2 diabetes mellitus with diabetic chronic kidney disease: Secondary | ICD-10-CM | POA: Diagnosis not present

## 2023-12-11 DIAGNOSIS — I69354 Hemiplegia and hemiparesis following cerebral infarction affecting left non-dominant side: Secondary | ICD-10-CM | POA: Diagnosis not present

## 2023-12-11 DIAGNOSIS — I2510 Atherosclerotic heart disease of native coronary artery without angina pectoris: Secondary | ICD-10-CM | POA: Diagnosis not present

## 2023-12-11 DIAGNOSIS — K5090 Crohn's disease, unspecified, without complications: Secondary | ICD-10-CM | POA: Diagnosis not present

## 2023-12-11 DIAGNOSIS — Z85828 Personal history of other malignant neoplasm of skin: Secondary | ICD-10-CM | POA: Diagnosis not present

## 2023-12-11 DIAGNOSIS — Z7982 Long term (current) use of aspirin: Secondary | ICD-10-CM | POA: Diagnosis not present

## 2023-12-11 DIAGNOSIS — M4802 Spinal stenosis, cervical region: Secondary | ICD-10-CM | POA: Diagnosis not present

## 2023-12-11 DIAGNOSIS — M461 Sacroiliitis, not elsewhere classified: Secondary | ICD-10-CM | POA: Diagnosis not present

## 2023-12-11 DIAGNOSIS — I480 Paroxysmal atrial fibrillation: Secondary | ICD-10-CM | POA: Diagnosis not present

## 2023-12-11 DIAGNOSIS — I700 Atherosclerosis of aorta: Secondary | ICD-10-CM | POA: Diagnosis not present

## 2023-12-11 DIAGNOSIS — Z794 Long term (current) use of insulin: Secondary | ICD-10-CM | POA: Diagnosis not present

## 2023-12-11 DIAGNOSIS — I5030 Unspecified diastolic (congestive) heart failure: Secondary | ICD-10-CM | POA: Diagnosis not present

## 2023-12-11 DIAGNOSIS — M47812 Spondylosis without myelopathy or radiculopathy, cervical region: Secondary | ICD-10-CM | POA: Diagnosis not present

## 2023-12-11 DIAGNOSIS — N1832 Chronic kidney disease, stage 3b: Secondary | ICD-10-CM | POA: Diagnosis not present

## 2023-12-11 DIAGNOSIS — K219 Gastro-esophageal reflux disease without esophagitis: Secondary | ICD-10-CM | POA: Diagnosis not present

## 2023-12-11 DIAGNOSIS — E1169 Type 2 diabetes mellitus with other specified complication: Secondary | ICD-10-CM | POA: Diagnosis not present

## 2023-12-11 DIAGNOSIS — Z7902 Long term (current) use of antithrombotics/antiplatelets: Secondary | ICD-10-CM | POA: Diagnosis not present

## 2023-12-11 DIAGNOSIS — I130 Hypertensive heart and chronic kidney disease with heart failure and stage 1 through stage 4 chronic kidney disease, or unspecified chronic kidney disease: Secondary | ICD-10-CM | POA: Diagnosis not present

## 2023-12-11 DIAGNOSIS — Z79899 Other long term (current) drug therapy: Secondary | ICD-10-CM | POA: Diagnosis not present

## 2023-12-11 DIAGNOSIS — M47816 Spondylosis without myelopathy or radiculopathy, lumbar region: Secondary | ICD-10-CM | POA: Diagnosis not present

## 2023-12-11 DIAGNOSIS — E782 Mixed hyperlipidemia: Secondary | ICD-10-CM | POA: Diagnosis not present

## 2023-12-15 DIAGNOSIS — N1832 Chronic kidney disease, stage 3b: Secondary | ICD-10-CM | POA: Diagnosis not present

## 2023-12-15 DIAGNOSIS — I080 Rheumatic disorders of both mitral and aortic valves: Secondary | ICD-10-CM | POA: Diagnosis not present

## 2023-12-15 DIAGNOSIS — Z7982 Long term (current) use of aspirin: Secondary | ICD-10-CM | POA: Diagnosis not present

## 2023-12-15 DIAGNOSIS — K219 Gastro-esophageal reflux disease without esophagitis: Secondary | ICD-10-CM | POA: Diagnosis not present

## 2023-12-15 DIAGNOSIS — Z794 Long term (current) use of insulin: Secondary | ICD-10-CM | POA: Diagnosis not present

## 2023-12-15 DIAGNOSIS — I5030 Unspecified diastolic (congestive) heart failure: Secondary | ICD-10-CM | POA: Diagnosis not present

## 2023-12-15 DIAGNOSIS — M48061 Spinal stenosis, lumbar region without neurogenic claudication: Secondary | ICD-10-CM | POA: Diagnosis not present

## 2023-12-15 DIAGNOSIS — Z79899 Other long term (current) drug therapy: Secondary | ICD-10-CM | POA: Diagnosis not present

## 2023-12-15 DIAGNOSIS — M4802 Spinal stenosis, cervical region: Secondary | ICD-10-CM | POA: Diagnosis not present

## 2023-12-15 DIAGNOSIS — I480 Paroxysmal atrial fibrillation: Secondary | ICD-10-CM | POA: Diagnosis not present

## 2023-12-15 DIAGNOSIS — M461 Sacroiliitis, not elsewhere classified: Secondary | ICD-10-CM | POA: Diagnosis not present

## 2023-12-15 DIAGNOSIS — Z7902 Long term (current) use of antithrombotics/antiplatelets: Secondary | ICD-10-CM | POA: Diagnosis not present

## 2023-12-15 DIAGNOSIS — M47816 Spondylosis without myelopathy or radiculopathy, lumbar region: Secondary | ICD-10-CM | POA: Diagnosis not present

## 2023-12-15 DIAGNOSIS — E1122 Type 2 diabetes mellitus with diabetic chronic kidney disease: Secondary | ICD-10-CM | POA: Diagnosis not present

## 2023-12-15 DIAGNOSIS — I130 Hypertensive heart and chronic kidney disease with heart failure and stage 1 through stage 4 chronic kidney disease, or unspecified chronic kidney disease: Secondary | ICD-10-CM | POA: Diagnosis not present

## 2023-12-15 DIAGNOSIS — M47812 Spondylosis without myelopathy or radiculopathy, cervical region: Secondary | ICD-10-CM | POA: Diagnosis not present

## 2023-12-15 DIAGNOSIS — I700 Atherosclerosis of aorta: Secondary | ICD-10-CM | POA: Diagnosis not present

## 2023-12-15 DIAGNOSIS — Z85828 Personal history of other malignant neoplasm of skin: Secondary | ICD-10-CM | POA: Diagnosis not present

## 2023-12-15 DIAGNOSIS — I2510 Atherosclerotic heart disease of native coronary artery without angina pectoris: Secondary | ICD-10-CM | POA: Diagnosis not present

## 2023-12-15 DIAGNOSIS — E1169 Type 2 diabetes mellitus with other specified complication: Secondary | ICD-10-CM | POA: Diagnosis not present

## 2023-12-15 DIAGNOSIS — E782 Mixed hyperlipidemia: Secondary | ICD-10-CM | POA: Diagnosis not present

## 2023-12-15 DIAGNOSIS — I69354 Hemiplegia and hemiparesis following cerebral infarction affecting left non-dominant side: Secondary | ICD-10-CM | POA: Diagnosis not present

## 2023-12-15 DIAGNOSIS — K5090 Crohn's disease, unspecified, without complications: Secondary | ICD-10-CM | POA: Diagnosis not present

## 2023-12-16 ENCOUNTER — Other Ambulatory Visit: Payer: Self-pay | Admitting: Nurse Practitioner

## 2023-12-22 DIAGNOSIS — E1122 Type 2 diabetes mellitus with diabetic chronic kidney disease: Secondary | ICD-10-CM | POA: Diagnosis not present

## 2023-12-22 DIAGNOSIS — M48061 Spinal stenosis, lumbar region without neurogenic claudication: Secondary | ICD-10-CM | POA: Diagnosis not present

## 2023-12-22 DIAGNOSIS — M4802 Spinal stenosis, cervical region: Secondary | ICD-10-CM | POA: Diagnosis not present

## 2023-12-22 DIAGNOSIS — Z7902 Long term (current) use of antithrombotics/antiplatelets: Secondary | ICD-10-CM | POA: Diagnosis not present

## 2023-12-22 DIAGNOSIS — I130 Hypertensive heart and chronic kidney disease with heart failure and stage 1 through stage 4 chronic kidney disease, or unspecified chronic kidney disease: Secondary | ICD-10-CM | POA: Diagnosis not present

## 2023-12-22 DIAGNOSIS — Z85828 Personal history of other malignant neoplasm of skin: Secondary | ICD-10-CM | POA: Diagnosis not present

## 2023-12-22 DIAGNOSIS — I2510 Atherosclerotic heart disease of native coronary artery without angina pectoris: Secondary | ICD-10-CM | POA: Diagnosis not present

## 2023-12-22 DIAGNOSIS — Z794 Long term (current) use of insulin: Secondary | ICD-10-CM | POA: Diagnosis not present

## 2023-12-22 DIAGNOSIS — K5090 Crohn's disease, unspecified, without complications: Secondary | ICD-10-CM | POA: Diagnosis not present

## 2023-12-22 DIAGNOSIS — M461 Sacroiliitis, not elsewhere classified: Secondary | ICD-10-CM | POA: Diagnosis not present

## 2023-12-22 DIAGNOSIS — M47812 Spondylosis without myelopathy or radiculopathy, cervical region: Secondary | ICD-10-CM | POA: Diagnosis not present

## 2023-12-22 DIAGNOSIS — K219 Gastro-esophageal reflux disease without esophagitis: Secondary | ICD-10-CM | POA: Diagnosis not present

## 2023-12-22 DIAGNOSIS — I700 Atherosclerosis of aorta: Secondary | ICD-10-CM | POA: Diagnosis not present

## 2023-12-22 DIAGNOSIS — M47816 Spondylosis without myelopathy or radiculopathy, lumbar region: Secondary | ICD-10-CM | POA: Diagnosis not present

## 2023-12-22 DIAGNOSIS — Z7982 Long term (current) use of aspirin: Secondary | ICD-10-CM | POA: Diagnosis not present

## 2023-12-22 DIAGNOSIS — I480 Paroxysmal atrial fibrillation: Secondary | ICD-10-CM | POA: Diagnosis not present

## 2023-12-22 DIAGNOSIS — Z79899 Other long term (current) drug therapy: Secondary | ICD-10-CM | POA: Diagnosis not present

## 2023-12-22 DIAGNOSIS — E1169 Type 2 diabetes mellitus with other specified complication: Secondary | ICD-10-CM | POA: Diagnosis not present

## 2023-12-22 DIAGNOSIS — N1832 Chronic kidney disease, stage 3b: Secondary | ICD-10-CM | POA: Diagnosis not present

## 2023-12-22 DIAGNOSIS — I69354 Hemiplegia and hemiparesis following cerebral infarction affecting left non-dominant side: Secondary | ICD-10-CM | POA: Diagnosis not present

## 2023-12-22 DIAGNOSIS — I080 Rheumatic disorders of both mitral and aortic valves: Secondary | ICD-10-CM | POA: Diagnosis not present

## 2023-12-22 DIAGNOSIS — E782 Mixed hyperlipidemia: Secondary | ICD-10-CM | POA: Diagnosis not present

## 2023-12-22 DIAGNOSIS — I5030 Unspecified diastolic (congestive) heart failure: Secondary | ICD-10-CM | POA: Diagnosis not present

## 2023-12-29 ENCOUNTER — Other Ambulatory Visit: Payer: Self-pay | Admitting: *Deleted

## 2023-12-29 ENCOUNTER — Telehealth: Payer: Self-pay | Admitting: Family Medicine

## 2023-12-29 DIAGNOSIS — Z8673 Personal history of transient ischemic attack (TIA), and cerebral infarction without residual deficits: Secondary | ICD-10-CM

## 2023-12-29 NOTE — Telephone Encounter (Signed)
 Copied from CRM [id number]. Topic: Clinical - Medical Advice >> Dec 29, 2023 12:14 PM Ivette P wrote: Reason for CRM: Gigi Gin called in to see about talking ot someone about being able to continue Physical Therapy. PT was told by physical therapist Maurine Minister stated she is needs more therapy. Gigi Gin would like a follow up [phone]

## 2023-12-30 NOTE — Telephone Encounter (Signed)
 She is aware that I am waiting to her from the PT, if she is not under services with them, she will ntbs again for a new referral to be placed.

## 2023-12-30 NOTE — Telephone Encounter (Signed)
 LMOVM for Maurine Minister PT w/ [facility] To confirm pt was still under services and that she needed more therapy.  Tried to call [name] back, NA/NVM to let her know that I called the PT for more information

## 2024-01-01 NOTE — Telephone Encounter (Signed)
 LMOVM to Maurine Minister to see if he can accept a VO for PT If not pt will need to be seen for new order.

## 2024-01-01 NOTE — Telephone Encounter (Signed)
 TC from [place] PT w/ [facility] Pt has been discharge from services, discussion was that if she had a decline that she could benefit from more PT services Please advise if we can place a new [facility] referral or will pt NTBS

## 2024-01-01 NOTE — Telephone Encounter (Signed)
 We can try and give a verbal for home health to continue if she already had it, diagnosis late effects of CVA, left sided weakness it looks like.  If they will not accept this then she will have to be seen again, can be virtual but needs to be face-to-face

## 2024-01-01 NOTE — Telephone Encounter (Signed)
 Please review

## 2024-01-02 NOTE — Telephone Encounter (Addendum)
 TC back from [place] PT w/ [facility] Pt was discharged 2 weeks ago Will need new PT order.  TC to daughter video appt made for next week.

## 2024-01-05 ENCOUNTER — Encounter: Payer: Self-pay | Admitting: Physician Assistant

## 2024-01-06 ENCOUNTER — Telehealth (INDEPENDENT_AMBULATORY_CARE_PROVIDER_SITE_OTHER): Payer: 59 | Admitting: Nurse Practitioner

## 2024-01-06 ENCOUNTER — Encounter: Payer: Self-pay | Admitting: Nurse Practitioner

## 2024-01-06 DIAGNOSIS — I6930 Unspecified sequelae of cerebral infarction: Secondary | ICD-10-CM | POA: Diagnosis not present

## 2024-01-06 NOTE — Progress Notes (Signed)
 Virtual Visit Consent   Kara Dean, you are scheduled for a virtual visit with Mary-Margaret Daphine Deutscher, FNP, a [facility] provider, today.     Just as with appointments in the office, your consent must be obtained to participate.  Your consent will be active for this visit and any virtual visit you may have with one of our providers in the next 365 days.     If you have a MyChart account, a copy of this consent can be sent to you electronically.  All virtual visits are billed to your insurance company just like a traditional visit in the office.    As this is a virtual visit, video technology does not allow for your provider to perform a traditional examination.  This may limit your provider's ability to fully assess your condition.  If your provider identifies any concerns that need to be evaluated in person or the need to arrange testing (such as labs, EKG, etc.), we will make arrangements to do so.     Although advances in technology are sophisticated, we cannot ensure that it will always work on either your end or our end.  If the connection with a video visit is poor, the visit may have to be switched to a telephone visit.  With either a video or telephone visit, we are not always able to ensure that we have a secure connection.     I need to obtain your verbal consent now.   Are you willing to proceed with your visit today? YES   Kara Dean has provided verbal consent on 01/06/2024 for a virtual visit (video or telephone).   Mary-Margaret Daphine Deutscher, FNP   Date: 01/06/2024 9:46 AM   Virtual Visit via Video Note   I, Mary-Margaret Daphine Deutscher, connected with Kara Dean ([id number], [birth year]) on 01/06/24 at  9:30 AM EST by a video-enabled telemedicine application and verified that I am speaking with the correct person using two identifiers.  Location: Patient: Virtual Visit Location Patient: Home Provider: Virtual Visit Location Provider: Mobile   I discussed the limitations of  evaluation and management by telemedicine and the availability of in person appointments. The patient expressed understanding and agreed to proceed.    History of Present Illness: Kara Dean is a 89 y.o. who identifies as a female who was assigned female at birth, and is being seen today for needs PT.  HPI: Patient needs PT ordered. She had a stroke December 14 and had some rehab but needs longer. She is doing well has some left sided weakness. She is still dragging left leg and looses balance easily    Review of Systems  Constitutional:  Negative for diaphoresis and weight loss.  Eyes:  Negative for blurred vision, double vision and pain.  Respiratory:  Negative for shortness of breath.   Cardiovascular:  Negative for chest pain, palpitations, orthopnea and leg swelling.  Gastrointestinal:  Negative for abdominal pain.  Skin:  Negative for rash.  Neurological:  Negative for dizziness, sensory change, loss of consciousness, weakness and headaches.  Endo/Heme/Allergies:  Negative for polydipsia. Does not bruise/bleed easily.  Psychiatric/Behavioral:  Negative for memory loss. The patient does not have insomnia.   All other systems reviewed and are negative.   Problems:  Patient Active Problem List   Diagnosis Date Noted   History of stroke 11/18/2023   Valvular heart disease 11/18/2023   Chronic heart failure with preserved ejection fraction (HCC) 11/18/2023   Acute ischemic stroke (HCC) 10/19/2023  Right pontine stroke (HCC) 10/18/2023   Choledocholithiasis with acute cholecystitis 05/07/2021   Peripheral edema 03/18/2017   Chronic kidney disease (CKD) stage G3a/A3, moderately decreased glomerular filtration rate (GFR) between 45-59 mL/min/1.73 square meter and albuminuria creatinine ratio greater than 300 mg/g (HCC) 07/27/2014   Gastroesophageal reflux disease without esophagitis 04/27/2014   GAD (generalized anxiety disorder) 04/27/2014   Late effect of cerebrovascular  accident (CVA) 03/26/2010   Hyperlipidemia associated with type 2 diabetes mellitus (HCC) 02/01/2009   Depression 02/01/2009   Essential hypertension 02/01/2009   Osteoarthritis 02/01/2009   Diabetes mellitus with insulin therapy (HCC) 02/01/2009    Allergies:  Allergies  Allergen Reactions   Codeine Other (See Comments)    unknown   Latex Rash   Morphine Other (See Comments)    unknown   Niacin Other (See Comments)    unknown   Sulfa Antibiotics Rash   Sulfonamide Derivatives Rash   Medications:  Current Outpatient Medications:    amLODipine (NORVASC) 10 MG tablet, Take 1 tablet (10 mg total) by mouth daily., Disp: 90 tablet, Rfl: 1   aspirin 81 MG chewable tablet, Chew 1 tablet (81 mg total) by mouth daily., Disp: , Rfl:    atorvastatin (LIPITOR) 20 MG tablet, TAKE ONE TABLET ONCE DAILY, Disp: 30 tablet, Rfl: 2   citalopram (CELEXA) 20 MG tablet, Take 1 tablet (20 mg total) by mouth daily., Disp: 90 tablet, Rfl: 1   clopidogrel (PLAVIX) 75 MG tablet, Take 1 tablet (75 mg total) by mouth daily., Disp: 21 tablet, Rfl: 0   fluticasone (FLONASE) 50 MCG/ACT nasal spray, USE 2 SPRAYS IN EACH NOSTRIL ONCE DAILY, Disp: 16 g, Rfl: 5   Insulin Pen Needle (PX EXTRA SHORT PEN NEEDLES) 31G X 6 MM MISC, Use as directed, Disp: 100 each, Rfl: 5   LANTUS SOLOSTAR 100 UNIT/ML Solostar Pen, Inject 40 Units into the skin daily., Disp: 15 mL, Rfl: 5   LORazepam (ATIVAN) 0.5 MG tablet, Take 1 tablet (0.5 mg total) by mouth 2 (two) times daily., Disp: 60 tablet, Rfl: 5   metoprolol succinate (TOPROL-XL) 25 MG 24 hr tablet, Take 1 tablet (25 mg total) by mouth daily., Disp: 90 tablet, Rfl: 1   olmesartan (BENICAR) 40 MG tablet, Take 1 tablet (40 mg total) by mouth daily., Disp: 90 tablet, Rfl: 1   ONETOUCH ULTRA test strip, CHECK BLOOD SUGER UP TO 3 TIMES A DAY, Disp: 300 strip, Rfl: 3   pantoprazole (PROTONIX) 40 MG tablet, Take 1 tablet (40 mg total) by mouth daily. (NEEDS TO BE SEEN BEFORE NEXT  REFILL), Disp: 90 tablet, Rfl: 1   potassium chloride (KLOR-CON) 10 MEQ tablet, Take 1 tablet (10 mEq total) by mouth daily., Disp: 90 tablet, Rfl: 1   tolterodine (DETROL LA) 4 MG 24 hr capsule, Take 1 capsule (4 mg total) by mouth daily., Disp: 180 capsule, Rfl: 1  Observations/Objective: Patient is well-developed, well-nourished in no acute distress.  Resting comfortably  at home.  Head is normocephalic, atraumatic.  No labored breathing.  Speech is clear and coherent with logical content.  Patient is alert and oriented at baseline.    Assessment and Plan:  Kara Dean in today with chief complaint of No chief complaint on file.   1. Late effect of cerebrovascular accident (CVA) (Primary) Fall precautions - Ambulatory referral to Home Health    Follow Up Instructions: I discussed the assessment and treatment plan with the patient. The patient was provided an opportunity to ask questions and  all were answered. The patient agreed with the plan and demonstrated an understanding of the instructions.  A copy of instructions were sent to the patient via MyChart.  The patient was advised to call back or seek an in-person evaluation if the symptoms worsen or if the condition fails to improve as anticipated.  Time:  I spent 6 minutes with the patient via telehealth technology discussing the above problems/concerns.    Mary-Margaret Daphine Deutscher, FNP

## 2024-01-06 NOTE — Patient Instructions (Signed)
 Fall Prevention in the Home, Adult Falls can cause injuries and can happen to people of all ages. There are many things you can do to make your home safer and to help prevent falls. What actions can I take to prevent falls? General information Use good lighting in all rooms. Make sure to: Replace any light bulbs that burn out. Turn on the lights in dark areas and use night-lights. Keep items that you use often in easy-to-reach places. Lower the shelves around your home if needed. Move furniture so that there are clear paths around it. Do not use throw rugs or other things on the floor that can make you trip. If any of your floors are uneven, fix them. Add color or contrast paint or tape to clearly mark and help you see: Grab bars or handrails. First and last steps of staircases. Where the edge of each step is. If you use a ladder or stepladder: Make sure that it is fully opened. Do not climb a closed ladder. Make sure the sides of the ladder are locked in place. Have someone hold the ladder while you use it. Know where your pets are as you move through your home. What can I do in the bathroom?     Keep the floor dry. Clean up any water on the floor right away. Remove soap buildup in the bathtub or shower. Buildup makes bathtubs and showers slippery. Use non-skid mats or decals on the floor of the bathtub or shower. Attach bath mats securely with double-sided, non-slip rug tape. If you need to sit down in the shower, use a non-slip stool. Install grab bars by the toilet and in the bathtub and shower. Do not use towel bars as grab bars. What can I do in the bedroom? Make sure that you have a light by your bed that is easy to reach. Do not use any sheets or blankets on your bed that hang to the floor. Have a firm chair or bench with side arms that you can use for support when you get dressed. What can I do in the kitchen? Clean up any spills right away. If you need to reach something  above you, use a step stool with a grab bar. Keep electrical cords out of the way. Do not use floor polish or wax that makes floors slippery. What can I do with my stairs? Do not leave anything on the stairs. Make sure that you have a light switch at the top and the bottom of the stairs. Make sure that there are handrails on both sides of the stairs. Fix handrails that are broken or loose. Install non-slip stair treads on all your stairs if they do not have carpet. Avoid having throw rugs at the top or bottom of the stairs. Choose a carpet that does not hide the edge of the steps on the stairs. Make sure that the carpet is firmly attached to the stairs. Fix carpet that is loose or worn. What can I do on the outside of my home? Use bright outdoor lighting. Fix the edges of walkways and driveways and fix any cracks. Clear paths of anything that can make you trip, such as tools or rocks. Add color or contrast paint or tape to clearly mark and help you see anything that might make you trip as you walk through a door, such as a raised step or threshold. Trim any bushes or trees on paths to your home. Check to see if handrails are loose  or broken and that both sides of all steps have handrails. Install guardrails along the edges of any raised decks and porches. Have leaves, snow, or ice cleared regularly. Use sand, salt, or ice melter on paths if you live where there is ice and snow during the winter. Clean up any spills in your garage right away. This includes grease or oil spills. What other actions can I take? Review your medicines with your doctor. Some medicines can cause dizziness or changes in blood pressure, which increase your risk of falling. Wear shoes that: Have a low heel. Do not wear high heels. Have rubber bottoms and are closed at the toe. Feel good on your feet and fit well. Use tools that help you move around if needed. These include: Canes. Walkers. Scooters. Crutches. Ask  your doctor what else you can do to help prevent falls. This may include seeing a physical therapist to learn to do exercises to move better and get stronger. Where to find more information Centers for Disease Control and Prevention, [employer]: TonerPromos.no [employer] on Aging: BaseRingTones.pl National Institute on Aging: BaseRingTones.pl Contact a doctor if: You are afraid of falling at home. You feel weak, drowsy, or dizzy at home. You fall at home. Get help right away if you: Lose consciousness or have trouble moving after a fall. Have a fall that causes a head injury. These symptoms may be an emergency. Get help right away. Call 911. Do not wait to see if the symptoms will go away. Do not drive yourself to the hospital. This information is not intended to replace advice given to you by your health care provider. Make sure you discuss any questions you have with your health care provider. Document Revised: 06/24/2022 Document Reviewed: 06/24/2022 Elsevier Patient Education  2024 [employer].

## 2024-01-15 ENCOUNTER — Telehealth: Payer: Self-pay

## 2024-01-15 NOTE — Telephone Encounter (Signed)
 Called and spoke with Marylene Land at [facility] and she stated that she just had to notify the PCP about the delay.

## 2024-01-15 NOTE — Telephone Encounter (Signed)
 Copied from CRM [id number]. Topic: Clinical - Home Health Verbal Orders >> Jan 15, 2024  2:37 PM Payton Doughty wrote: Caller/Agency: [facility] Callback Number: [phone] Service Requested: Physical Therapy Frequency: pt has requested a delay in start of care until Sun, march 16 Any new concerns about the patient? No

## 2024-01-18 DIAGNOSIS — E1169 Type 2 diabetes mellitus with other specified complication: Secondary | ICD-10-CM | POA: Diagnosis not present

## 2024-01-18 DIAGNOSIS — Z7902 Long term (current) use of antithrombotics/antiplatelets: Secondary | ICD-10-CM | POA: Diagnosis not present

## 2024-01-18 DIAGNOSIS — K219 Gastro-esophageal reflux disease without esophagitis: Secondary | ICD-10-CM | POA: Diagnosis not present

## 2024-01-18 DIAGNOSIS — E785 Hyperlipidemia, unspecified: Secondary | ICD-10-CM | POA: Diagnosis not present

## 2024-01-18 DIAGNOSIS — E1122 Type 2 diabetes mellitus with diabetic chronic kidney disease: Secondary | ICD-10-CM | POA: Diagnosis not present

## 2024-01-18 DIAGNOSIS — N1831 Chronic kidney disease, stage 3a: Secondary | ICD-10-CM | POA: Diagnosis not present

## 2024-01-18 DIAGNOSIS — Z7982 Long term (current) use of aspirin: Secondary | ICD-10-CM | POA: Diagnosis not present

## 2024-01-18 DIAGNOSIS — I69354 Hemiplegia and hemiparesis following cerebral infarction affecting left non-dominant side: Secondary | ICD-10-CM | POA: Diagnosis not present

## 2024-01-18 DIAGNOSIS — I5032 Chronic diastolic (congestive) heart failure: Secondary | ICD-10-CM | POA: Diagnosis not present

## 2024-01-18 DIAGNOSIS — Z794 Long term (current) use of insulin: Secondary | ICD-10-CM | POA: Diagnosis not present

## 2024-01-18 DIAGNOSIS — I130 Hypertensive heart and chronic kidney disease with heart failure and stage 1 through stage 4 chronic kidney disease, or unspecified chronic kidney disease: Secondary | ICD-10-CM | POA: Diagnosis not present

## 2024-01-18 DIAGNOSIS — K8050 Calculus of bile duct without cholangitis or cholecystitis without obstruction: Secondary | ICD-10-CM | POA: Diagnosis not present

## 2024-01-18 DIAGNOSIS — Z79899 Other long term (current) drug therapy: Secondary | ICD-10-CM | POA: Diagnosis not present

## 2024-01-19 ENCOUNTER — Telehealth: Payer: Self-pay

## 2024-01-19 NOTE — Telephone Encounter (Signed)
 Left detail message.

## 2024-01-19 NOTE — Telephone Encounter (Signed)
Verbals okay 

## 2024-01-19 NOTE — Telephone Encounter (Signed)
 Copied from CRM [id number]. Topic: Clinical - Home Health Verbal Orders >> Jan 19, 2024  3:07 PM Priscille Loveless wrote: Caller/Agency: Marcelino Duster at [facility] Number: [phone] Service Requested: Physical Therapy Frequency: 2x week x 4 weeks Any new concerns about the patient? No

## 2024-01-21 ENCOUNTER — Encounter: Payer: Self-pay | Admitting: Neurology

## 2024-01-21 ENCOUNTER — Ambulatory Visit (INDEPENDENT_AMBULATORY_CARE_PROVIDER_SITE_OTHER): Payer: 59 | Admitting: Neurology

## 2024-01-21 VITALS — BP 141/61 | HR 60 | Ht 60.0 in | Wt 124.0 lb

## 2024-01-21 DIAGNOSIS — E7800 Pure hypercholesterolemia, unspecified: Secondary | ICD-10-CM | POA: Diagnosis not present

## 2024-01-21 DIAGNOSIS — R7309 Other abnormal glucose: Secondary | ICD-10-CM | POA: Diagnosis not present

## 2024-01-21 DIAGNOSIS — I6381 Other cerebral infarction due to occlusion or stenosis of small artery: Secondary | ICD-10-CM | POA: Diagnosis not present

## 2024-01-21 DIAGNOSIS — R799 Abnormal finding of blood chemistry, unspecified: Secondary | ICD-10-CM | POA: Diagnosis not present

## 2024-01-21 DIAGNOSIS — R29898 Other symptoms and signs involving the musculoskeletal system: Secondary | ICD-10-CM | POA: Diagnosis not present

## 2024-01-21 DIAGNOSIS — E7849 Other hyperlipidemia: Secondary | ICD-10-CM

## 2024-01-21 DIAGNOSIS — I6350 Cerebral infarction due to unspecified occlusion or stenosis of unspecified cerebral artery: Secondary | ICD-10-CM | POA: Diagnosis not present

## 2024-01-21 NOTE — Patient Instructions (Signed)
 I had a long d/w patient , her daughter and granddaughter about her recent pontine stroke, left leg weakness, risk for recurrent stroke/TIAs, personally independently reviewed imaging studies and stroke evaluation results and answered questions.Continue aspirin 81 mg daily  for secondary stroke prevention and maintain strict control of hypertension with blood pressure goal below 130/90, diabetes with hemoglobin A1c goal below 6.5% and lipids with LDL cholesterol goal below 70 mg/dL. I also advised the patient to eat a healthy diet with plenty of whole grains, cereals, fruits and vegetables, exercise regularly and maintain ideal body weight.  Continue ongoing physical and occupational therapy.  She was advised to use her wheeled walker at all times and we discussed fall safety precautions.  Check follow-up lipid profile and hemoglobin A1c today.  Followup in the future with my nurse practitioner in 6 months or call earlier if necessary.  Fall Prevention in the Home, Adult Falls can cause injuries and affect people of all ages. There are many simple things that you can do to make your home safe and to help prevent falls. If you need it, ask for help making these changes. What actions can I take to prevent falls? General information Use good lighting in all rooms. Make sure to: Replace any light bulbs that burn out. Turn on lights if it is dark and use night-lights. Keep items that you use often in easy-to-reach places. Lower the shelves around your home if needed. Move furniture so that there are clear paths around it. Do not keep throw rugs or other things on the floor that can make you trip. If any of your floors are uneven, fix them. Add color or contrast paint or tape to clearly mark and help you see: Grab bars or handrails. First and last steps of staircases. Where the edge of each step is. If you use a ladder or stepladder: Make sure that it is fully opened. Do not climb a closed ladder. Make  sure the sides of the ladder are locked in place. Have someone hold the ladder while you use it. Know where your pets are as you move through your home. What can I do in the bathroom?     Keep the floor dry. Clean up any water that is on the floor right away. Remove soap buildup in the bathtub or shower. Buildup makes bathtubs and showers slippery. Use non-skid mats or decals on the floor of the bathtub or shower. Attach bath mats securely with double-sided, non-slip rug tape. If you need to sit down while you are in the shower, use a non-slip stool. Install grab bars by the toilet and in the bathtub and shower. Do not use towel bars as grab bars. What can I do in the bedroom? Make sure that you have a light by your bed that is easy to reach. Do not use any sheets or blankets on your bed that hang to the floor. Have a firm bench or chair with side arms that you can use for support when you get dressed. What can I do in the kitchen? Clean up any spills right away. If you need to reach something above you, use a sturdy step stool that has a grab bar. Keep electrical cables out of the way. Do not use floor polish or wax that makes floors slippery. What can I do with my stairs? Do not leave anything on the stairs. Make sure that you have a light switch at the top and the bottom of the stairs.  Have them installed if you do not have them. Make sure that there are handrails on both sides of the stairs. Fix handrails that are broken or loose. Make sure that handrails are as long as the staircases. Install non-slip stair treads on all stairs in your home if they do not have carpet. Avoid having throw rugs at the top or bottom of stairs, or secure the rugs with carpet tape to prevent them from moving. Choose a carpet design that does not hide the edge of steps on the stairs. Make sure that carpet is firmly attached to the stairs. Fix any carpet that is loose or worn. What can I do on the outside  of my home? Use bright outdoor lighting. Repair the edges of walkways and driveways and fix any cracks. Clear paths of anything that can make you trip, such as tools or rocks. Add color or contrast paint or tape to clearly mark and help you see high doorway thresholds. Trim any bushes or trees on the main path into your home. Check that handrails are securely fastened and in good repair. Both sides of all steps should have handrails. Install guardrails along the edges of any raised decks or porches. Have leaves, snow, and ice cleared regularly. Use sand, salt, or ice melt on walkways during winter months if you live where there is ice and snow. In the garage, clean up any spills right away, including grease or oil spills. What other actions can I take? Review your medicines with your health care provider. Some medicines can make you confused or feel dizzy. This can increase your chance of falling. Wear closed-toe shoes that fit well and support your feet. Wear shoes that have rubber soles and low heels. Use a cane, walker, scooter, or crutches that help you move around if needed. Talk with your provider about other ways that you can decrease your risk of falls. This may include seeing a physical therapist to learn to do exercises to improve movement and strength. Where to find more information Centers for Disease Control and Prevention, STEADI: TonerPromos.no [employer] on Aging: BaseRingTones.pl National Institute on Aging: BaseRingTones.pl Contact a health care provider if: You are afraid of falling at home. You feel weak, drowsy, or dizzy at home. You fall at home. Get help right away if you: Lose consciousness or have trouble moving after a fall. Have a fall that causes a head injury. These symptoms may be an emergency. Get help right away. Call 911. Do not wait to see if the symptoms will go away. Do not drive yourself to the hospital. This information is not intended to replace advice given  to you by your health care provider. Make sure you discuss any questions you have with your health care provider. Document Revised: 06/24/2022 Document Reviewed: 06/24/2022 Elsevier Patient Education  2024 Elsevier Inc.  Stroke Prevention Some medical conditions and behaviors can lead to a higher chance of having a stroke. You can help prevent a stroke by eating healthy, exercising, not smoking, and managing any medical conditions you have. Stroke is a leading cause of functional impairment. Primary prevention is particularly important because a majority of strokes are first-time events. Stroke changes the lives of not only those who experience a stroke but also their family and other caregivers. How can this condition affect me? A stroke is a medical emergency and should be treated right away. A stroke can lead to brain damage and can sometimes be life-threatening. If a person gets medical  treatment right away, there is a better chance of surviving and recovering from a stroke. What can increase my risk? The following medical conditions may increase your risk of a stroke: Cardiovascular disease. High blood pressure (hypertension). Diabetes. High cholesterol. Sickle cell disease. Blood clotting disorders (hypercoagulable state). Obesity. Sleep disorders (obstructive sleep apnea). Other risk factors include: Being older than age 60. Having a history of blood clots, stroke, or mini-stroke (transient ischemic attack, TIA). Genetic factors, such as race, ethnicity, or a family history of stroke. Smoking cigarettes or using other tobacco products. Taking birth control pills, especially if you also use tobacco. Heavy use of alcohol or drugs, especially cocaine and methamphetamine. Physical inactivity. What actions can I take to prevent this? Manage your health conditions High cholesterol levels. Eating a healthy diet is important for preventing high cholesterol. If cholesterol cannot be  managed through diet alone, you may need to take medicines. Take any prescribed medicines to control your cholesterol as told by your health care provider. Hypertension. To reduce your risk of stroke, try to keep your blood pressure below 130/80. Eating a healthy diet and exercising regularly are important for controlling blood pressure. If these steps are not enough to manage your blood pressure, you may need to take medicines. Take any prescribed medicines to control hypertension as told by your health care provider. Ask your health care provider if you should monitor your blood pressure at home. Have your blood pressure checked every year, even if your blood pressure is normal. Blood pressure increases with age and some medical conditions. Diabetes. Eating a healthy diet and exercising regularly are important parts of managing your blood sugar (glucose). If your blood sugar cannot be managed through diet and exercise, you may need to take medicines. Take any prescribed medicines to control your diabetes as told by your health care provider. Get evaluated for obstructive sleep apnea. Talk to your health care provider about getting a sleep evaluation if you snore a lot or have excessive sleepiness. Make sure that any other medical conditions you have, such as atrial fibrillation or atherosclerosis, are managed. Nutrition Follow instructions from your health care provider about what to eat or drink to help manage your health condition. These instructions may include: Reducing your daily calorie intake. Limiting how much salt (sodium) you use to 1,500 milligrams (mg) each day. Using only healthy fats for cooking, such as olive oil, canola oil, or sunflower oil. Eating healthy foods. You can do this by: Choosing foods that are high in fiber, such as whole grains, and fresh fruits and vegetables. Eating at least 5 servings of fruits and vegetables a day. Try to fill one-half of your plate with  fruits and vegetables at each meal. Choosing lean protein foods, such as lean cuts of meat, poultry without skin, fish, tofu, beans, and nuts. Eating low-fat dairy products. Avoiding foods that are high in sodium. This can help lower blood pressure. Avoiding foods that have saturated fat, trans fat, and cholesterol. This can help prevent high cholesterol. Avoiding processed and prepared foods. Counting your daily carbohydrate intake.  Lifestyle If you drink alcohol: Limit how much you have to: 0-1 drink a day for women who are not pregnant. 0-2 drinks a day for men. Know how much alcohol is in your drink. In the [place] one drink equals one 12 oz bottle of beer ( ), one 5 oz glass of wine ( ), or one 1 oz glass of hard liquor (44mL). Do not use any products that contain  nicotine or tobacco. These products include cigarettes, chewing tobacco, and vaping devices, such as e-cigarettes. If you need help quitting, ask your health care provider. Avoid secondhand smoke. Do not use drugs. Activity  Try to stay at a healthy weight. Get at least 30 minutes of exercise on most days, such as: Fast walking. Biking. Swimming. Medicines Take over-the-counter and prescription medicines only as told by your health care provider. Aspirin or blood thinners (antiplatelets or anticoagulants) may be recommended to reduce your risk of forming blood clots that can lead to stroke. Avoid taking birth control pills. Talk to your health care provider about the risks of taking birth control pills if: You are over 59 years old. You smoke. You get very bad headaches. You have had a blood clot. Where to find more information American Stroke Association: www.strokeassociation.org Get help right away if: You or a loved one has any symptoms of a stroke. "BE FAST" is an easy way to remember the main warning signs of a stroke: B - Balance. Signs are dizziness, sudden trouble walking, or loss of balance. E -  Eyes. Signs are trouble seeing or a sudden change in vision. F - Face. Signs are sudden weakness or numbness of the face, or the face or eyelid drooping on one side. A - Arms. Signs are weakness or numbness in an arm. This happens suddenly and usually on one side of the body. S - Speech. Signs are sudden trouble speaking, slurred speech, or trouble understanding what people say. T - Time. Time to call emergency services. Write down what time symptoms started. You or a loved one has other signs of a stroke, such as: A sudden, severe headache with no known cause. Nausea or vomiting. Seizure. These symptoms may represent a serious problem that is an emergency. Do not wait to see if the symptoms will go away. Get medical help right away. Call your local emergency services (911 in the [place] Do not drive yourself to the hospital. Summary You can help to prevent a stroke by eating healthy, exercising, not smoking, limiting alcohol intake, and managing any medical conditions you may have. Do not use any products that contain nicotine or tobacco. These include cigarettes, chewing tobacco, and vaping devices, such as e-cigarettes. If you need help quitting, ask your health care provider. Remember "BE FAST" for warning signs of a stroke. Get help right away if you or a loved one has any of these signs. This information is not intended to replace advice given to you by your health care provider. Make sure you discuss any questions you have with your health care provider. Document Revised: 09/23/2022 Document Reviewed: 09/23/2022 Elsevier Patient Education  2024 [employer].

## 2024-01-21 NOTE — Progress Notes (Signed)
 [facility] [address] [place]. [place] [address] [phone]       OFFICE CONSULT NOTE  Ms. SHAWNIQUE MARIOTTI Date of Birth:  1933-01-21 Medical Record Number:  [id number]   Referring MD: Onalee Hua Tat  Reason for Referral: Stroke  HPI: Kara Dean is a pleasant 89 year old Caucasian lady seen today for initial office consultation visit for stroke.  She is accompanied by her daughter and granddaughter.  History is provided by them and review of electronic medical records.  I personally reviewed pertinent available imaging films in PACS. 89 year old female with a history of hypertension, hyperlipidemia, CHF, CVA/TIA with no residual deficits, diabetes mellitus type 2, hyperlipidemia presenting with left-sided weakness started about 2 days prior to admission. daughter had stated that the last known normal was about 48 hours prior to admission around 10/16/2023. On the morning of 10/17/2023, the patient was noted to have some difficulty with ambulation and dragging her left lower extremity. There was some reported left upper extremity weakness. There was no visual disturbance or slurred speech or sensory disturbance.  She denied any prior history of strokes.  CT head was unremarkable.  MRI scan showed tiny right pontine lacunar infarct adjacent to an older 1.  There are also old bilateral thalamic lacunar infarcts noted.  CT angiogram of the head and neck showed only mild right P2 stenosis.  Echocardiogram showed ejection fraction of 60 to 65%.  No PFO.  Left atrial size was normal.  LDL cholesterol 85 mg percent.  Hemoglobin A1c was 7.3.  She had been on Aggrenox prior to admission this was changed to aspirin and Plavix for 3 weeks followed by aspirin alone.  Patient was discharged home with home physical and Occupational Therapy.  She stated she did well initially but after the first round of therapy was over she started dragging her left leg.  She does walk with a walker but at times  get tired on the left leg drags.  She plans to have a second round of home physical and Occupational Therapy starting this week.  She is tolerating aspirin well without bruising or bleeding.  She is tolerating Lipitor well without significant side effects.  Her blood pressure is under good control.  She used to live alone but now daughter has moved in with her.  She is still quite independent and has good memory.  She had remote history of TIA and was probably started on Aggrenox at that time but is unable to provide me with any details and I did not find any mention of this in electronic medical records. ROS:   14 system review of systems is positive for leg weakness, difficulty walking, decreased hearing, bruising all other systems negative  PMH:  Past Medical History:  Diagnosis Date   Anxiety    Carotid artery disease (HCC)    Carotid US 11/2018:  bilateral ICA 1-39 >> repeat 2 years.    Cataract    Crohn's colitis (HCC)    CVA (cerebral infarction)    No deficits   Depression    Diabetes mellitus    type 2   Dyslipidemia    GERD (gastroesophageal reflux disease)    History of TIAs    no deficits   Hyperlipidemia    Hypertension    Hyponatremia    Late effect of cerebrovascular accident (CVA) 03/26/2010   Monitor 12/2023: no atrial fibrillation         Nodular basal cell carcinoma (BCC) 04/22/2018   left prox,  nasal root (tx p bx)   Osteoarthritis    SCC (squamous cell carcinoma) 04/22/2018   left shin,superior-(tx p bx), Left shin,inferior-(tx p bx )    Social History:  Social History   Socioeconomic History   Marital status: Widowed    Spouse name: Not on file   Number of children: 6   Years of education: 10   Highest education level: 10th grade  Occupational History   Occupation: Retired    Comment: [employer]  Tobacco Use   Smoking status: Never    Passive exposure: Current   Smokeless tobacco: Never  Vaping Use   Vaping status: Never Used  Substance and Sexual  Activity   Alcohol use: No   Drug use: No   Sexual activity: Not Currently  Other Topics Concern   Not on file  Social History Narrative   Mrs Zobrist is retired and widowed. She lives at home and her granddaughter lives with her. She lives within walking distance to her 2 daughter's houses. Has an indoor cat. Her sister moved to [place] and she doesn't get to get to visit with her often but does talk with her on the phone. She has 6 children and many grandchildren and great grandchildren.    Social Drivers of Corporate investment banker Strain: Low Risk  (03/12/2023)   Overall Financial Resource Strain (CARDIA)    Difficulty of Paying Living Expenses: Not hard at all  Food Insecurity: Food Insecurity Present (10/18/2023)   Hunger Vital Sign    Worried About Running Out of Food in the Last Year: Sometimes true    Ran Out of Food in the Last Year: Sometimes true  Transportation Needs: No Transportation Needs (10/18/2023)   PRAPARE - Administrator, Civil Service (Medical): No    Lack of Transportation (Non-Medical): No  Physical Activity: Insufficiently Active (03/12/2023)   Exercise Vital Sign    Days of Exercise per Week: 3 days    Minutes of Exercise per Session: 30 min  Stress: No Stress Concern Present (03/12/2023)   [employer] of Occupational Health - Occupational Stress Questionnaire    Feeling of Stress : Not at all  Social Connections: Socially Isolated (03/12/2023)   Social Connection and Isolation Panel [NHANES]    Frequency of Communication with Friends and Family: More than three times a week    Frequency of Social Gatherings with Friends and Family: More than three times a week    Attends Religious Services: Never    Database administrator or Organizations: No    Attends Banker Meetings: Never    Marital Status: Widowed  Intimate Partner Violence: Not At Risk (10/18/2023)   Humiliation, Afraid, Rape, and Kick questionnaire    Fear of Current  or Ex-Partner: No    Emotionally Abused: No    Physically Abused: No    Sexually Abused: No    Medications:   Current Outpatient Medications on File Prior to Visit  Medication Sig Dispense Refill   amLODipine (NORVASC) 10 MG tablet Take 1 tablet (10 mg total) by mouth daily. 90 tablet 1   aspirin 81 MG chewable tablet Chew 1 tablet (81 mg total) by mouth daily.     atorvastatin (LIPITOR) 20 MG tablet TAKE ONE TABLET ONCE DAILY 30 tablet 2   citalopram (CELEXA) 20 MG tablet Take 1 tablet (20 mg total) by mouth daily. 90 tablet 1   fluticasone (FLONASE) 50 MCG/ACT nasal spray USE 2 SPRAYS IN [facility]  NOSTRIL ONCE DAILY 16 g 5   Insulin Pen Needle (PX EXTRA SHORT PEN NEEDLES) 31G X 6 MM MISC Use as directed 100 each 5   LANTUS SOLOSTAR 100 UNIT/ML Solostar Pen Inject 40 Units into the skin daily. 15 mL 5   LORazepam (ATIVAN) 0.5 MG tablet Take 1 tablet (0.5 mg total) by mouth 2 (two) times daily. 60 tablet 5   metoprolol succinate (TOPROL-XL) 25 MG 24 hr tablet Take 1 tablet (25 mg total) by mouth daily. 90 tablet 1   olmesartan (BENICAR) 40 MG tablet Take 1 tablet (40 mg total) by mouth daily. 90 tablet 1   ONETOUCH ULTRA test strip CHECK BLOOD SUGER UP TO 3 TIMES A DAY 300 strip 3   pantoprazole (PROTONIX) 40 MG tablet Take 1 tablet (40 mg total) by mouth daily. (NEEDS TO BE SEEN BEFORE NEXT REFILL) 90 tablet 1   potassium chloride (KLOR-CON) 10 MEQ tablet Take 1 tablet (10 mEq total) by mouth daily. 90 tablet 1   tolterodine (DETROL LA) 4 MG 24 hr capsule Take 1 capsule (4 mg total) by mouth daily. 180 capsule 1   clopidogrel (PLAVIX) 75 MG tablet Take 1 tablet (75 mg total) by mouth daily. (Patient not taking: Reported on 01/21/2024) 21 tablet 0   No current facility-administered medications on file prior to visit.    Allergies:   Allergies  Allergen Reactions   Codeine Other (See Comments)    unknown   Latex Rash   Morphine Other (See Comments)    unknown   Niacin Other (See  Comments)    unknown   Sulfa Antibiotics Rash   Sulfonamide Derivatives Rash    Physical Exam General: Frail petite elderly Caucasian lady, seated, in no evident distress Head: head normocephalic and atraumatic.   Neck: supple with no carotid or supraclavicular bruits Cardiovascular: regular rate and rhythm, no murmurs Musculoskeletal: no deformity Skin:  no rash/petichiae Vascular:  Normal pulses all extremities  Neurologic Exam Mental Status: Awake and fully alert. Oriented to place and time. Recent and remote memory intact. Attention span, concentration and fund of knowledge appropriate. Mood and affect appropriate.  Cranial Nerves: Fundoscopic exam reveals sharp disc margins. Pupils equal, briskly reactive to light. Extraocular movements full without nystagmus. Visual fields full to confrontation. Hearing diminished bilaterally.. Facial sensation intact. Face, tongue, palate moves normally and symmetrically.  Motor: Normal bulk and tone. Normal strength in all tested extremity muscles. Sensory.: intact to touch , pinprick , position and vibratory sensation.  Coordination: Rapid alternating movements normal in all extremities. Finger-to-nose and heel-to-shin performed accurately bilaterally. Gait and Station: Arises from chair without difficulty. Stance is slightly stooped.  Uses a wheeled walker.  Gait demonstrates normal stride length and balance but slight dragging of the left leg..  Tandem walking not performed. Reflexes: 1+ and symmetric. Toes downgoing.   NIHSS  0 Modified Rankin  1   ASSESSMENT: 89 year old pleasant Caucasian lady with left hemiparesis secondary to right pontine lacunar in December 2024.  Vascular risk factors of age, hyperlipidemia, silent cerebrovascular disease diabetes, heart failure, remote TIA and hypertension.   PLAN: I had a long d/w patient , her daughter and granddaughter about her recent pontine stroke, left leg weakness, risk for recurrent  stroke/TIAs, personally independently reviewed imaging studies and stroke evaluation results and answered questions.Continue aspirin 81 mg daily  for secondary stroke prevention and maintain strict control of hypertension with blood pressure goal below 130/90, diabetes with hemoglobin A1c goal below 6.5% and lipids  with LDL cholesterol goal below 70 mg/dL. I also advised the patient to eat a healthy diet with plenty of whole grains, cereals, fruits and vegetables, exercise regularly and maintain ideal body weight.  Continue ongoing physical and occupational therapy.  She was advised to use her wheeled walker at all times and we discussed fall safety precautions.  Check follow-up lipid profile and hemoglobin A1c today.  Followup in the future with my nurse practitioner in 6 months or call earlier if necessary.  Greater than 50% time during this 50-minute visit was spent on counseling and coordination of care about her pontine stroke and leg weakness and answering questions.  Delia Heady, MD  Note: This document was prepared with digital dictation and possible smart phrase technology. Any transcriptional errors that result from this process are unintentional.

## 2024-01-22 ENCOUNTER — Telehealth: Payer: Self-pay | Admitting: *Deleted

## 2024-01-22 DIAGNOSIS — Z7902 Long term (current) use of antithrombotics/antiplatelets: Secondary | ICD-10-CM | POA: Diagnosis not present

## 2024-01-22 DIAGNOSIS — I130 Hypertensive heart and chronic kidney disease with heart failure and stage 1 through stage 4 chronic kidney disease, or unspecified chronic kidney disease: Secondary | ICD-10-CM | POA: Diagnosis not present

## 2024-01-22 DIAGNOSIS — E1169 Type 2 diabetes mellitus with other specified complication: Secondary | ICD-10-CM | POA: Diagnosis not present

## 2024-01-22 DIAGNOSIS — Z79899 Other long term (current) drug therapy: Secondary | ICD-10-CM | POA: Diagnosis not present

## 2024-01-22 DIAGNOSIS — I69354 Hemiplegia and hemiparesis following cerebral infarction affecting left non-dominant side: Secondary | ICD-10-CM | POA: Diagnosis not present

## 2024-01-22 DIAGNOSIS — K219 Gastro-esophageal reflux disease without esophagitis: Secondary | ICD-10-CM | POA: Diagnosis not present

## 2024-01-22 DIAGNOSIS — E785 Hyperlipidemia, unspecified: Secondary | ICD-10-CM | POA: Diagnosis not present

## 2024-01-22 DIAGNOSIS — Z7982 Long term (current) use of aspirin: Secondary | ICD-10-CM | POA: Diagnosis not present

## 2024-01-22 DIAGNOSIS — E1122 Type 2 diabetes mellitus with diabetic chronic kidney disease: Secondary | ICD-10-CM | POA: Diagnosis not present

## 2024-01-22 DIAGNOSIS — I5032 Chronic diastolic (congestive) heart failure: Secondary | ICD-10-CM | POA: Diagnosis not present

## 2024-01-22 DIAGNOSIS — K8050 Calculus of bile duct without cholangitis or cholecystitis without obstruction: Secondary | ICD-10-CM | POA: Diagnosis not present

## 2024-01-22 DIAGNOSIS — Z794 Long term (current) use of insulin: Secondary | ICD-10-CM | POA: Diagnosis not present

## 2024-01-22 DIAGNOSIS — N1831 Chronic kidney disease, stage 3a: Secondary | ICD-10-CM | POA: Diagnosis not present

## 2024-01-22 LAB — HEMOGLOBIN A1C
Est. average glucose Bld gHb Est-mCnc: 197 mg/dL
Hgb A1c MFr Bld: 8.5 % — ABNORMAL HIGH (ref 4.8–5.6)

## 2024-01-22 LAB — LIPID PANEL
Chol/HDL Ratio: 2.7 ratio (ref 0.0–4.4)
Cholesterol, Total: 109 mg/dL (ref 100–199)
HDL: 41 mg/dL (ref >39)
LDL Chol Calc (NIH): 41 mg/dL (ref 0–99)
Triglycerides: 162 mg/dL — ABNORMAL HIGH (ref 0–149)
VLDL Cholesterol Cal: 27 mg/dL (ref 5–40)

## 2024-01-22 NOTE — Telephone Encounter (Signed)
-----   Message from Delia Heady sent at 01/22/2024  4:11 PM EDT ----- Joneen Roach inform the patient that cholesterol profile is mostly satisfactory.  Hemoglobin A1c is high at 8.5 which is worse than 3 months ago.  Kindly see primary care physician to discuss tighter diabetes control.

## 2024-01-22 NOTE — Progress Notes (Signed)
 Kindly inform the patient that cholesterol profile is mostly satisfactory.  Hemoglobin A1c is high at 8.5 which is worse than 3 months ago.  Kindly see primary care physician to discuss tighter diabetes control.

## 2024-01-22 NOTE — Telephone Encounter (Signed)
 Spoke to daughter (checked DPR) gave labwork results Gave Dr. Pearlean Brownie recommendation  Kindly see primary care physician to discuss tighter diabetes control.Daughter states will cal pcp [facility] to schedule f/u appointment for patient  sent labwork to pcp daughter expressed understanding and thanked me for calling

## 2024-01-28 DIAGNOSIS — N1831 Chronic kidney disease, stage 3a: Secondary | ICD-10-CM | POA: Diagnosis not present

## 2024-01-28 DIAGNOSIS — Z7982 Long term (current) use of aspirin: Secondary | ICD-10-CM | POA: Diagnosis not present

## 2024-01-28 DIAGNOSIS — K8050 Calculus of bile duct without cholangitis or cholecystitis without obstruction: Secondary | ICD-10-CM | POA: Diagnosis not present

## 2024-01-28 DIAGNOSIS — Z794 Long term (current) use of insulin: Secondary | ICD-10-CM | POA: Diagnosis not present

## 2024-01-28 DIAGNOSIS — Z79899 Other long term (current) drug therapy: Secondary | ICD-10-CM | POA: Diagnosis not present

## 2024-01-28 DIAGNOSIS — K219 Gastro-esophageal reflux disease without esophagitis: Secondary | ICD-10-CM | POA: Diagnosis not present

## 2024-01-28 DIAGNOSIS — E1169 Type 2 diabetes mellitus with other specified complication: Secondary | ICD-10-CM | POA: Diagnosis not present

## 2024-01-28 DIAGNOSIS — I69354 Hemiplegia and hemiparesis following cerebral infarction affecting left non-dominant side: Secondary | ICD-10-CM | POA: Diagnosis not present

## 2024-01-28 DIAGNOSIS — I5032 Chronic diastolic (congestive) heart failure: Secondary | ICD-10-CM | POA: Diagnosis not present

## 2024-01-28 DIAGNOSIS — E1122 Type 2 diabetes mellitus with diabetic chronic kidney disease: Secondary | ICD-10-CM | POA: Diagnosis not present

## 2024-01-28 DIAGNOSIS — I130 Hypertensive heart and chronic kidney disease with heart failure and stage 1 through stage 4 chronic kidney disease, or unspecified chronic kidney disease: Secondary | ICD-10-CM | POA: Diagnosis not present

## 2024-01-28 DIAGNOSIS — E785 Hyperlipidemia, unspecified: Secondary | ICD-10-CM | POA: Diagnosis not present

## 2024-01-28 DIAGNOSIS — Z7902 Long term (current) use of antithrombotics/antiplatelets: Secondary | ICD-10-CM | POA: Diagnosis not present

## 2024-01-30 DIAGNOSIS — Z7902 Long term (current) use of antithrombotics/antiplatelets: Secondary | ICD-10-CM | POA: Diagnosis not present

## 2024-01-30 DIAGNOSIS — E1122 Type 2 diabetes mellitus with diabetic chronic kidney disease: Secondary | ICD-10-CM | POA: Diagnosis not present

## 2024-01-30 DIAGNOSIS — K219 Gastro-esophageal reflux disease without esophagitis: Secondary | ICD-10-CM | POA: Diagnosis not present

## 2024-01-30 DIAGNOSIS — E785 Hyperlipidemia, unspecified: Secondary | ICD-10-CM | POA: Diagnosis not present

## 2024-01-30 DIAGNOSIS — E1169 Type 2 diabetes mellitus with other specified complication: Secondary | ICD-10-CM | POA: Diagnosis not present

## 2024-01-30 DIAGNOSIS — N1831 Chronic kidney disease, stage 3a: Secondary | ICD-10-CM | POA: Diagnosis not present

## 2024-01-30 DIAGNOSIS — I69354 Hemiplegia and hemiparesis following cerebral infarction affecting left non-dominant side: Secondary | ICD-10-CM | POA: Diagnosis not present

## 2024-01-30 DIAGNOSIS — Z7982 Long term (current) use of aspirin: Secondary | ICD-10-CM | POA: Diagnosis not present

## 2024-01-30 DIAGNOSIS — Z79899 Other long term (current) drug therapy: Secondary | ICD-10-CM | POA: Diagnosis not present

## 2024-01-30 DIAGNOSIS — Z794 Long term (current) use of insulin: Secondary | ICD-10-CM | POA: Diagnosis not present

## 2024-01-30 DIAGNOSIS — I5032 Chronic diastolic (congestive) heart failure: Secondary | ICD-10-CM | POA: Diagnosis not present

## 2024-01-30 DIAGNOSIS — K8050 Calculus of bile duct without cholangitis or cholecystitis without obstruction: Secondary | ICD-10-CM | POA: Diagnosis not present

## 2024-01-30 DIAGNOSIS — I130 Hypertensive heart and chronic kidney disease with heart failure and stage 1 through stage 4 chronic kidney disease, or unspecified chronic kidney disease: Secondary | ICD-10-CM | POA: Diagnosis not present

## 2024-02-02 DIAGNOSIS — I130 Hypertensive heart and chronic kidney disease with heart failure and stage 1 through stage 4 chronic kidney disease, or unspecified chronic kidney disease: Secondary | ICD-10-CM | POA: Diagnosis not present

## 2024-02-02 DIAGNOSIS — K219 Gastro-esophageal reflux disease without esophagitis: Secondary | ICD-10-CM | POA: Diagnosis not present

## 2024-02-02 DIAGNOSIS — E1169 Type 2 diabetes mellitus with other specified complication: Secondary | ICD-10-CM | POA: Diagnosis not present

## 2024-02-02 DIAGNOSIS — Z794 Long term (current) use of insulin: Secondary | ICD-10-CM | POA: Diagnosis not present

## 2024-02-02 DIAGNOSIS — E1122 Type 2 diabetes mellitus with diabetic chronic kidney disease: Secondary | ICD-10-CM | POA: Diagnosis not present

## 2024-02-02 DIAGNOSIS — I69354 Hemiplegia and hemiparesis following cerebral infarction affecting left non-dominant side: Secondary | ICD-10-CM | POA: Diagnosis not present

## 2024-02-02 DIAGNOSIS — K8050 Calculus of bile duct without cholangitis or cholecystitis without obstruction: Secondary | ICD-10-CM | POA: Diagnosis not present

## 2024-02-02 DIAGNOSIS — Z7902 Long term (current) use of antithrombotics/antiplatelets: Secondary | ICD-10-CM | POA: Diagnosis not present

## 2024-02-02 DIAGNOSIS — Z7982 Long term (current) use of aspirin: Secondary | ICD-10-CM | POA: Diagnosis not present

## 2024-02-02 DIAGNOSIS — E785 Hyperlipidemia, unspecified: Secondary | ICD-10-CM | POA: Diagnosis not present

## 2024-02-02 DIAGNOSIS — N1831 Chronic kidney disease, stage 3a: Secondary | ICD-10-CM | POA: Diagnosis not present

## 2024-02-02 DIAGNOSIS — I5032 Chronic diastolic (congestive) heart failure: Secondary | ICD-10-CM | POA: Diagnosis not present

## 2024-02-02 DIAGNOSIS — Z79899 Other long term (current) drug therapy: Secondary | ICD-10-CM | POA: Diagnosis not present

## 2024-02-04 DIAGNOSIS — N1831 Chronic kidney disease, stage 3a: Secondary | ICD-10-CM | POA: Diagnosis not present

## 2024-02-04 DIAGNOSIS — I69354 Hemiplegia and hemiparesis following cerebral infarction affecting left non-dominant side: Secondary | ICD-10-CM | POA: Diagnosis not present

## 2024-02-04 DIAGNOSIS — E1122 Type 2 diabetes mellitus with diabetic chronic kidney disease: Secondary | ICD-10-CM | POA: Diagnosis not present

## 2024-02-04 DIAGNOSIS — K8050 Calculus of bile duct without cholangitis or cholecystitis without obstruction: Secondary | ICD-10-CM | POA: Diagnosis not present

## 2024-02-04 DIAGNOSIS — K219 Gastro-esophageal reflux disease without esophagitis: Secondary | ICD-10-CM | POA: Diagnosis not present

## 2024-02-04 DIAGNOSIS — I130 Hypertensive heart and chronic kidney disease with heart failure and stage 1 through stage 4 chronic kidney disease, or unspecified chronic kidney disease: Secondary | ICD-10-CM | POA: Diagnosis not present

## 2024-02-04 DIAGNOSIS — Z7902 Long term (current) use of antithrombotics/antiplatelets: Secondary | ICD-10-CM | POA: Diagnosis not present

## 2024-02-04 DIAGNOSIS — E785 Hyperlipidemia, unspecified: Secondary | ICD-10-CM | POA: Diagnosis not present

## 2024-02-04 DIAGNOSIS — I5032 Chronic diastolic (congestive) heart failure: Secondary | ICD-10-CM | POA: Diagnosis not present

## 2024-02-04 DIAGNOSIS — Z7982 Long term (current) use of aspirin: Secondary | ICD-10-CM | POA: Diagnosis not present

## 2024-02-04 DIAGNOSIS — E1169 Type 2 diabetes mellitus with other specified complication: Secondary | ICD-10-CM | POA: Diagnosis not present

## 2024-02-04 DIAGNOSIS — Z794 Long term (current) use of insulin: Secondary | ICD-10-CM | POA: Diagnosis not present

## 2024-02-04 DIAGNOSIS — Z79899 Other long term (current) drug therapy: Secondary | ICD-10-CM | POA: Diagnosis not present

## 2024-02-09 DIAGNOSIS — Z794 Long term (current) use of insulin: Secondary | ICD-10-CM | POA: Diagnosis not present

## 2024-02-09 DIAGNOSIS — Z79899 Other long term (current) drug therapy: Secondary | ICD-10-CM | POA: Diagnosis not present

## 2024-02-09 DIAGNOSIS — E785 Hyperlipidemia, unspecified: Secondary | ICD-10-CM | POA: Diagnosis not present

## 2024-02-09 DIAGNOSIS — E1122 Type 2 diabetes mellitus with diabetic chronic kidney disease: Secondary | ICD-10-CM | POA: Diagnosis not present

## 2024-02-09 DIAGNOSIS — K8050 Calculus of bile duct without cholangitis or cholecystitis without obstruction: Secondary | ICD-10-CM | POA: Diagnosis not present

## 2024-02-09 DIAGNOSIS — I5032 Chronic diastolic (congestive) heart failure: Secondary | ICD-10-CM | POA: Diagnosis not present

## 2024-02-09 DIAGNOSIS — I130 Hypertensive heart and chronic kidney disease with heart failure and stage 1 through stage 4 chronic kidney disease, or unspecified chronic kidney disease: Secondary | ICD-10-CM | POA: Diagnosis not present

## 2024-02-09 DIAGNOSIS — Z7902 Long term (current) use of antithrombotics/antiplatelets: Secondary | ICD-10-CM | POA: Diagnosis not present

## 2024-02-09 DIAGNOSIS — K219 Gastro-esophageal reflux disease without esophagitis: Secondary | ICD-10-CM | POA: Diagnosis not present

## 2024-02-09 DIAGNOSIS — I69354 Hemiplegia and hemiparesis following cerebral infarction affecting left non-dominant side: Secondary | ICD-10-CM | POA: Diagnosis not present

## 2024-02-09 DIAGNOSIS — N1831 Chronic kidney disease, stage 3a: Secondary | ICD-10-CM | POA: Diagnosis not present

## 2024-02-09 DIAGNOSIS — E1169 Type 2 diabetes mellitus with other specified complication: Secondary | ICD-10-CM | POA: Diagnosis not present

## 2024-02-09 DIAGNOSIS — Z7982 Long term (current) use of aspirin: Secondary | ICD-10-CM | POA: Diagnosis not present

## 2024-02-11 DIAGNOSIS — Z79899 Other long term (current) drug therapy: Secondary | ICD-10-CM | POA: Diagnosis not present

## 2024-02-11 DIAGNOSIS — Z7902 Long term (current) use of antithrombotics/antiplatelets: Secondary | ICD-10-CM | POA: Diagnosis not present

## 2024-02-11 DIAGNOSIS — E1122 Type 2 diabetes mellitus with diabetic chronic kidney disease: Secondary | ICD-10-CM | POA: Diagnosis not present

## 2024-02-11 DIAGNOSIS — I69354 Hemiplegia and hemiparesis following cerebral infarction affecting left non-dominant side: Secondary | ICD-10-CM | POA: Diagnosis not present

## 2024-02-11 DIAGNOSIS — E1169 Type 2 diabetes mellitus with other specified complication: Secondary | ICD-10-CM | POA: Diagnosis not present

## 2024-02-11 DIAGNOSIS — K8050 Calculus of bile duct without cholangitis or cholecystitis without obstruction: Secondary | ICD-10-CM | POA: Diagnosis not present

## 2024-02-11 DIAGNOSIS — E785 Hyperlipidemia, unspecified: Secondary | ICD-10-CM | POA: Diagnosis not present

## 2024-02-11 DIAGNOSIS — I5032 Chronic diastolic (congestive) heart failure: Secondary | ICD-10-CM | POA: Diagnosis not present

## 2024-02-11 DIAGNOSIS — K219 Gastro-esophageal reflux disease without esophagitis: Secondary | ICD-10-CM | POA: Diagnosis not present

## 2024-02-11 DIAGNOSIS — I130 Hypertensive heart and chronic kidney disease with heart failure and stage 1 through stage 4 chronic kidney disease, or unspecified chronic kidney disease: Secondary | ICD-10-CM | POA: Diagnosis not present

## 2024-02-11 DIAGNOSIS — Z7982 Long term (current) use of aspirin: Secondary | ICD-10-CM | POA: Diagnosis not present

## 2024-02-11 DIAGNOSIS — N1831 Chronic kidney disease, stage 3a: Secondary | ICD-10-CM | POA: Diagnosis not present

## 2024-02-11 DIAGNOSIS — Z794 Long term (current) use of insulin: Secondary | ICD-10-CM | POA: Diagnosis not present

## 2024-02-13 ENCOUNTER — Encounter: Payer: Self-pay | Admitting: Cardiovascular Disease

## 2024-02-13 ENCOUNTER — Ambulatory Visit: Attending: Cardiology | Admitting: Cardiovascular Disease

## 2024-02-13 VITALS — BP 144/54 | HR 63 | Ht 60.0 in | Wt 124.8 lb

## 2024-02-13 DIAGNOSIS — I498 Other specified cardiac arrhythmias: Secondary | ICD-10-CM | POA: Diagnosis not present

## 2024-02-13 DIAGNOSIS — I639 Cerebral infarction, unspecified: Secondary | ICD-10-CM | POA: Diagnosis not present

## 2024-02-13 DIAGNOSIS — I5032 Chronic diastolic (congestive) heart failure: Secondary | ICD-10-CM | POA: Diagnosis not present

## 2024-02-13 NOTE — Patient Instructions (Signed)
 Medication Instructions:  Your physician recommends that you continue on your current medications as directed. Please refer to the Current Medication list given to you today.  Labwork: None ordered.  Testing/Procedures: None ordered.  Follow-Up:  Your physician wants you to follow-up in: one year with Dr. York Pellant.  You will receive a reminder letter in the mail two months in advance. If you don't receive a letter, please call our office to schedule the follow-up appointment.  Implantable Loop Recorder Placement, Care After This sheet gives you information about how to care for yourself after your procedure. Your health care provider may also give you more specific instructions. If you have problems or questions, contact your health care provider.  What can I expect after the procedure? After the procedure, it is common to have: Soreness or discomfort near the incision. Some swelling or bruising near the incision.  Follow these instructions at home: Incision care  Monitor your cardiac device site for redness, swelling, and drainage. Call the device clinic at [phone] if you experience these symptoms or fever/chills.  Keep the large square bandage on your site for 24 hours and then you may remove it yourself. Keep the steri-strips underneath in place.   You may shower after 72 hours / 3 days from your procedure with the steri-strips in place. They will usually fall off on their own, or may be removed after 10 days. Pat dry.   Avoid lotions, ointments, or perfumes over your incision until it is well-healed.  Please do not submerge in water until your site is completely healed.   Your device is MRI compatible.   Remote monitoring is used to monitor your cardiac device from home. This monitoring is scheduled every month by our office. It allows [place] to keep an eye on the function of your device to ensure it is working properly.  If your wound site starts to bleed apply  pressure.    For help with the monitor please call Medtronic Monitor Support Specialist directly at [phone].    If you have any questions/concerns please call the device clinic at [phone].  Activity  Return to your normal activities.  General instructions Follow instructions from your health care provider about how to manage your implantable loop recorder and transmit the information. Learn how to activate a recording if this is necessary for your type of device. You may go through a metal detection gate, and you may let someone hold a metal detector over your chest. Show your ID card if needed. Do not have an MRI unless you check with your health care provider first. Take over-the-counter and prescription medicines only as told by your health care provider. Keep all follow-up visits as told by your health care provider. This is important. Contact a health care provider if: You have redness, swelling, or pain around your incision. You have a fever. You have pain that is not relieved by your pain medicine. You have triggered your device because of fainting (syncope) or because of a heartbeat that feels like it is racing, slow, fluttering, or skipping (palpitations). Get help right away if you have: Chest pain. Difficulty breathing. Summary After the procedure, it is common to have soreness or discomfort near the incision. Change your dressing as told by your health care provider. Follow instructions from your health care provider about how to manage your implantable loop recorder and transmit the information. Keep all follow-up visits as told by your health care provider. This is important. This information  is not intended to replace advice given to you by your health care provider. Make sure you discuss any questions you have with your health care provider. Document Released: 10/02/2015 Document Revised: 12/06/2017 Document Reviewed: 12/06/2017 Elsevier Patient Education   2020 [employer].

## 2024-02-13 NOTE — Progress Notes (Signed)
 Electrophysiology Office Note:    Date:  02/13/2024   ID:  Kara Dean, Kara Dean 1933-03-17, MRN [id number]  PCP:  Bennie Pierini, FNP   [facility] Providers Cardiologist:  Tonny Bollman, MD Cardiology APP:  Kennon Rounds     Referring MD: Beatrice Lecher, PA-C   History of Present Illness:    Kara Dean is a 89 y.o. female with a medical history significant for stroke December 2024, frequent PVCs, hypertension, hyperlipidemia, diabetes, referred for ILR placement.     Discussed the use of AI scribe software for clinical note transcription with the patient, who gave verbal consent to proceed.  History of Present Illness Kara Dean is an 89 year old female who presents for evaluation of stroke and potential atrial fibrillation. She is accompanied by her daughter, who is also her caregiver, and her son, Kara Dean.  She experienced a stroke on December 14th of the previous year. There was concern that the stroke could have been caused by a clot originating from the heart, possibly due to an arrhythmia such as atrial fibrillation.  She underwent a 30-day heart rhythm monitoring which did not show any episodes of atrial fibrillation. However, it was noted that atrial fibrillation can be intermittent and may not occur every month, making it difficult to definitively rule out as a cause of her stroke.  Currently, she is on aspirin for stroke prevention, which is effective for strokes originating from arteries. Her daughter, who is her caregiver, expressed challenges with the current external monitor, particularly during bathing, as it is bulky and requires proximity to function properly.         Today, she reports that she is doing well and has no complaint.  EKGs/Labs/Other Studies Reviewed Today:     Echocardiogram:  TTE October 19, 2023 EF 60 to 65%.  No regional wall motion abnormalities.  Moderate LVH.  Grade 1 diastolic dysfunction.  Mild aortic  regurgitation, aortic valve sclerosis; mild mitral regurgitation.   Monitors:  30 day monitor 11/2023  -- my interpretation Sinus rhythm HR 43-103, avg 58 No atrial fibrillation detected  Stress testing:  10/07/2023 Normal study. No ischemia  Advanced imaging:    EKG:   EKG Interpretation Date/Time:  Friday February 13 2024 13:36:48 EDT Ventricular Rate:  63 PR Interval:  194 QRS Duration:  108 QT Interval:  466 QTC Calculation: 476 R Axis:   -52  Text Interpretation: Normal sinus rhythm with PACs Left axis deviation Left ventricular hypertrophy with repolarization abnormality ( R in aVL , Cornell product ) Cannot rule out Septal infarct (cited on or before 15-Aug-2023) When compared with ECG of 15-Aug-2023 14:52, Premature ventricular complexes are no longer Present Confirmed by Kara Dean [phone]) on 02/13/2024 1:47:17 PM     Physical Exam:    VS:  BP (!) 144/54 (BP Location: Left Arm, Patient Position: Sitting, Cuff Size: Normal)   Pulse 63   Ht 5' (1.524 m)   Wt 124 lb 12.8 oz (56.6 kg)   SpO2 96%   BMI 24.37 kg/m     Wt Readings from Last 3 Encounters:  02/13/24 124 lb 12.8 oz (56.6 kg)  01/21/24 124 lb (56.2 kg)  11/18/23 122 lb 9.6 oz (55.6 kg)     GEN: Well nourished, well developed in no acute distress CARDIAC: RRR, no murmurs, rubs, gallops RESPIRATORY:  Normal work of breathing MUSCULOSKELETAL: no edema    ASSESSMENT & PLAN:     Stroke Possibly cardioembolic  Loop recorder has been requested We discussed the indication and rationale for the procedure.  I explained risks including infection and bleeding.  I explained the monitoring process with the associated fee.  SURGEON:  Maurice Small, MD     PREPROCEDURE DIAGNOSIS:  Cryptogenic stroke    POSTPROCEDURE DIAGNOSIS:  Cryptogenic stroke     PROCEDURES:   1. Implantable loop recorder implantation    INTRODUCTION:   Kara Dean  is a 89 y.o. patient with a history of cryptogenic  stroke. Inpatient telemetry has been reviewed and not shown atrial fibrillation. The patient therefore presents today for implantable loop implantation. The costs of loop recorder monitoring have been discussed with the patient.    DESCRIPTION OF PROCEDURE:  Informed written consent was obtained.  A timeout was performed. The patient required no sedation for the procedure today.  The patients left chest was prepped and draped in the usual sterile fashion. The skin overlying the left parasternal region was infiltrated with lidocaine for local analgesia.  A 0.5-cm incision was made over the left parasternal region over the 3rd intercostal space.  A Medtronic implantable loop recorder was then placed into the pocket  R waves were very prominent and measured >0.28mV.  Steri- Strips and a sterile dressing were then applied.  There were no early apparent complications.     CONCLUSIONS:   1. Successful implantation of a Medtronic implantable loop recorder for a history of cryptogenic stroke  2. No early apparent complications.   Maurice Small, MD  Cardiac Electrophysiology   Signed, Maurice Small, MD  02/13/2024 1:52 PM    [facility]

## 2024-02-16 DIAGNOSIS — Z7902 Long term (current) use of antithrombotics/antiplatelets: Secondary | ICD-10-CM | POA: Diagnosis not present

## 2024-02-16 DIAGNOSIS — Z794 Long term (current) use of insulin: Secondary | ICD-10-CM | POA: Diagnosis not present

## 2024-02-16 DIAGNOSIS — I69354 Hemiplegia and hemiparesis following cerebral infarction affecting left non-dominant side: Secondary | ICD-10-CM | POA: Diagnosis not present

## 2024-02-16 DIAGNOSIS — K219 Gastro-esophageal reflux disease without esophagitis: Secondary | ICD-10-CM | POA: Diagnosis not present

## 2024-02-16 DIAGNOSIS — E1122 Type 2 diabetes mellitus with diabetic chronic kidney disease: Secondary | ICD-10-CM | POA: Diagnosis not present

## 2024-02-16 DIAGNOSIS — Z79899 Other long term (current) drug therapy: Secondary | ICD-10-CM | POA: Diagnosis not present

## 2024-02-16 DIAGNOSIS — E785 Hyperlipidemia, unspecified: Secondary | ICD-10-CM | POA: Diagnosis not present

## 2024-02-16 DIAGNOSIS — I5032 Chronic diastolic (congestive) heart failure: Secondary | ICD-10-CM | POA: Diagnosis not present

## 2024-02-16 DIAGNOSIS — E1169 Type 2 diabetes mellitus with other specified complication: Secondary | ICD-10-CM | POA: Diagnosis not present

## 2024-02-16 DIAGNOSIS — I130 Hypertensive heart and chronic kidney disease with heart failure and stage 1 through stage 4 chronic kidney disease, or unspecified chronic kidney disease: Secondary | ICD-10-CM | POA: Diagnosis not present

## 2024-02-16 DIAGNOSIS — N1831 Chronic kidney disease, stage 3a: Secondary | ICD-10-CM | POA: Diagnosis not present

## 2024-02-16 DIAGNOSIS — K8050 Calculus of bile duct without cholangitis or cholecystitis without obstruction: Secondary | ICD-10-CM | POA: Diagnosis not present

## 2024-02-16 DIAGNOSIS — Z7982 Long term (current) use of aspirin: Secondary | ICD-10-CM | POA: Diagnosis not present

## 2024-02-18 DIAGNOSIS — E1122 Type 2 diabetes mellitus with diabetic chronic kidney disease: Secondary | ICD-10-CM | POA: Diagnosis not present

## 2024-02-18 DIAGNOSIS — I130 Hypertensive heart and chronic kidney disease with heart failure and stage 1 through stage 4 chronic kidney disease, or unspecified chronic kidney disease: Secondary | ICD-10-CM | POA: Diagnosis not present

## 2024-02-18 DIAGNOSIS — K8050 Calculus of bile duct without cholangitis or cholecystitis without obstruction: Secondary | ICD-10-CM | POA: Diagnosis not present

## 2024-02-18 DIAGNOSIS — I69354 Hemiplegia and hemiparesis following cerebral infarction affecting left non-dominant side: Secondary | ICD-10-CM | POA: Diagnosis not present

## 2024-02-18 DIAGNOSIS — N1831 Chronic kidney disease, stage 3a: Secondary | ICD-10-CM | POA: Diagnosis not present

## 2024-02-18 DIAGNOSIS — Z7902 Long term (current) use of antithrombotics/antiplatelets: Secondary | ICD-10-CM | POA: Diagnosis not present

## 2024-02-18 DIAGNOSIS — Z794 Long term (current) use of insulin: Secondary | ICD-10-CM | POA: Diagnosis not present

## 2024-02-18 DIAGNOSIS — K219 Gastro-esophageal reflux disease without esophagitis: Secondary | ICD-10-CM | POA: Diagnosis not present

## 2024-02-18 DIAGNOSIS — E785 Hyperlipidemia, unspecified: Secondary | ICD-10-CM | POA: Diagnosis not present

## 2024-02-18 DIAGNOSIS — I5032 Chronic diastolic (congestive) heart failure: Secondary | ICD-10-CM | POA: Diagnosis not present

## 2024-02-18 DIAGNOSIS — E1169 Type 2 diabetes mellitus with other specified complication: Secondary | ICD-10-CM | POA: Diagnosis not present

## 2024-02-18 DIAGNOSIS — Z7982 Long term (current) use of aspirin: Secondary | ICD-10-CM | POA: Diagnosis not present

## 2024-02-18 DIAGNOSIS — Z79899 Other long term (current) drug therapy: Secondary | ICD-10-CM | POA: Diagnosis not present

## 2024-02-23 DIAGNOSIS — Z7902 Long term (current) use of antithrombotics/antiplatelets: Secondary | ICD-10-CM | POA: Diagnosis not present

## 2024-02-23 DIAGNOSIS — K8050 Calculus of bile duct without cholangitis or cholecystitis without obstruction: Secondary | ICD-10-CM | POA: Diagnosis not present

## 2024-02-23 DIAGNOSIS — I69354 Hemiplegia and hemiparesis following cerebral infarction affecting left non-dominant side: Secondary | ICD-10-CM | POA: Diagnosis not present

## 2024-02-23 DIAGNOSIS — E785 Hyperlipidemia, unspecified: Secondary | ICD-10-CM | POA: Diagnosis not present

## 2024-02-23 DIAGNOSIS — Z794 Long term (current) use of insulin: Secondary | ICD-10-CM | POA: Diagnosis not present

## 2024-02-23 DIAGNOSIS — Z7982 Long term (current) use of aspirin: Secondary | ICD-10-CM | POA: Diagnosis not present

## 2024-02-23 DIAGNOSIS — E1169 Type 2 diabetes mellitus with other specified complication: Secondary | ICD-10-CM | POA: Diagnosis not present

## 2024-02-23 DIAGNOSIS — I5032 Chronic diastolic (congestive) heart failure: Secondary | ICD-10-CM | POA: Diagnosis not present

## 2024-02-23 DIAGNOSIS — K219 Gastro-esophageal reflux disease without esophagitis: Secondary | ICD-10-CM | POA: Diagnosis not present

## 2024-02-23 DIAGNOSIS — E1122 Type 2 diabetes mellitus with diabetic chronic kidney disease: Secondary | ICD-10-CM | POA: Diagnosis not present

## 2024-02-23 DIAGNOSIS — I130 Hypertensive heart and chronic kidney disease with heart failure and stage 1 through stage 4 chronic kidney disease, or unspecified chronic kidney disease: Secondary | ICD-10-CM | POA: Diagnosis not present

## 2024-02-23 DIAGNOSIS — N1831 Chronic kidney disease, stage 3a: Secondary | ICD-10-CM | POA: Diagnosis not present

## 2024-02-23 DIAGNOSIS — Z79899 Other long term (current) drug therapy: Secondary | ICD-10-CM | POA: Diagnosis not present

## 2024-02-25 DIAGNOSIS — E1122 Type 2 diabetes mellitus with diabetic chronic kidney disease: Secondary | ICD-10-CM | POA: Diagnosis not present

## 2024-02-25 DIAGNOSIS — Z7982 Long term (current) use of aspirin: Secondary | ICD-10-CM | POA: Diagnosis not present

## 2024-02-25 DIAGNOSIS — N1831 Chronic kidney disease, stage 3a: Secondary | ICD-10-CM | POA: Diagnosis not present

## 2024-02-25 DIAGNOSIS — Z794 Long term (current) use of insulin: Secondary | ICD-10-CM | POA: Diagnosis not present

## 2024-02-25 DIAGNOSIS — I69354 Hemiplegia and hemiparesis following cerebral infarction affecting left non-dominant side: Secondary | ICD-10-CM | POA: Diagnosis not present

## 2024-02-25 DIAGNOSIS — K8050 Calculus of bile duct without cholangitis or cholecystitis without obstruction: Secondary | ICD-10-CM | POA: Diagnosis not present

## 2024-02-25 DIAGNOSIS — I130 Hypertensive heart and chronic kidney disease with heart failure and stage 1 through stage 4 chronic kidney disease, or unspecified chronic kidney disease: Secondary | ICD-10-CM | POA: Diagnosis not present

## 2024-02-25 DIAGNOSIS — Z7902 Long term (current) use of antithrombotics/antiplatelets: Secondary | ICD-10-CM | POA: Diagnosis not present

## 2024-02-25 DIAGNOSIS — I5032 Chronic diastolic (congestive) heart failure: Secondary | ICD-10-CM | POA: Diagnosis not present

## 2024-02-25 DIAGNOSIS — Z79899 Other long term (current) drug therapy: Secondary | ICD-10-CM | POA: Diagnosis not present

## 2024-02-25 DIAGNOSIS — E785 Hyperlipidemia, unspecified: Secondary | ICD-10-CM | POA: Diagnosis not present

## 2024-02-25 DIAGNOSIS — E1169 Type 2 diabetes mellitus with other specified complication: Secondary | ICD-10-CM | POA: Diagnosis not present

## 2024-02-25 DIAGNOSIS — K219 Gastro-esophageal reflux disease without esophagitis: Secondary | ICD-10-CM | POA: Diagnosis not present

## 2024-03-01 ENCOUNTER — Other Ambulatory Visit: Payer: Self-pay | Admitting: Nurse Practitioner

## 2024-03-01 DIAGNOSIS — K219 Gastro-esophageal reflux disease without esophagitis: Secondary | ICD-10-CM

## 2024-03-01 DIAGNOSIS — I10 Essential (primary) hypertension: Secondary | ICD-10-CM

## 2024-03-02 DIAGNOSIS — I5032 Chronic diastolic (congestive) heart failure: Secondary | ICD-10-CM | POA: Diagnosis not present

## 2024-03-02 DIAGNOSIS — I69354 Hemiplegia and hemiparesis following cerebral infarction affecting left non-dominant side: Secondary | ICD-10-CM | POA: Diagnosis not present

## 2024-03-02 DIAGNOSIS — Z7902 Long term (current) use of antithrombotics/antiplatelets: Secondary | ICD-10-CM | POA: Diagnosis not present

## 2024-03-02 DIAGNOSIS — E785 Hyperlipidemia, unspecified: Secondary | ICD-10-CM | POA: Diagnosis not present

## 2024-03-02 DIAGNOSIS — E1142 Type 2 diabetes mellitus with diabetic polyneuropathy: Secondary | ICD-10-CM | POA: Diagnosis not present

## 2024-03-02 DIAGNOSIS — E1169 Type 2 diabetes mellitus with other specified complication: Secondary | ICD-10-CM | POA: Diagnosis not present

## 2024-03-02 DIAGNOSIS — K219 Gastro-esophageal reflux disease without esophagitis: Secondary | ICD-10-CM | POA: Diagnosis not present

## 2024-03-02 DIAGNOSIS — Z794 Long term (current) use of insulin: Secondary | ICD-10-CM | POA: Diagnosis not present

## 2024-03-02 DIAGNOSIS — L84 Corns and callosities: Secondary | ICD-10-CM | POA: Diagnosis not present

## 2024-03-02 DIAGNOSIS — B351 Tinea unguium: Secondary | ICD-10-CM | POA: Diagnosis not present

## 2024-03-02 DIAGNOSIS — M79675 Pain in left toe(s): Secondary | ICD-10-CM | POA: Diagnosis not present

## 2024-03-02 DIAGNOSIS — M79674 Pain in right toe(s): Secondary | ICD-10-CM | POA: Diagnosis not present

## 2024-03-02 DIAGNOSIS — Z79899 Other long term (current) drug therapy: Secondary | ICD-10-CM | POA: Diagnosis not present

## 2024-03-02 DIAGNOSIS — K8050 Calculus of bile duct without cholangitis or cholecystitis without obstruction: Secondary | ICD-10-CM | POA: Diagnosis not present

## 2024-03-02 DIAGNOSIS — E1122 Type 2 diabetes mellitus with diabetic chronic kidney disease: Secondary | ICD-10-CM | POA: Diagnosis not present

## 2024-03-02 DIAGNOSIS — Z7982 Long term (current) use of aspirin: Secondary | ICD-10-CM | POA: Diagnosis not present

## 2024-03-02 DIAGNOSIS — I130 Hypertensive heart and chronic kidney disease with heart failure and stage 1 through stage 4 chronic kidney disease, or unspecified chronic kidney disease: Secondary | ICD-10-CM | POA: Diagnosis not present

## 2024-03-02 DIAGNOSIS — N1831 Chronic kidney disease, stage 3a: Secondary | ICD-10-CM | POA: Diagnosis not present

## 2024-03-08 ENCOUNTER — Ambulatory Visit (INDEPENDENT_AMBULATORY_CARE_PROVIDER_SITE_OTHER): Payer: 59 | Admitting: Nurse Practitioner

## 2024-03-08 ENCOUNTER — Encounter: Payer: Self-pay | Admitting: Nurse Practitioner

## 2024-03-08 VITALS — BP 126/62 | HR 67 | Temp 98.4°F | Ht 60.0 in | Wt 121.0 lb

## 2024-03-08 DIAGNOSIS — Z8673 Personal history of transient ischemic attack (TIA), and cerebral infarction without residual deficits: Secondary | ICD-10-CM

## 2024-03-08 DIAGNOSIS — Z7982 Long term (current) use of aspirin: Secondary | ICD-10-CM | POA: Diagnosis not present

## 2024-03-08 DIAGNOSIS — N1831 Chronic kidney disease, stage 3a: Secondary | ICD-10-CM | POA: Diagnosis not present

## 2024-03-08 DIAGNOSIS — I10 Essential (primary) hypertension: Secondary | ICD-10-CM | POA: Diagnosis not present

## 2024-03-08 DIAGNOSIS — Z79899 Other long term (current) drug therapy: Secondary | ICD-10-CM | POA: Diagnosis not present

## 2024-03-08 DIAGNOSIS — R600 Localized edema: Secondary | ICD-10-CM

## 2024-03-08 DIAGNOSIS — E785 Hyperlipidemia, unspecified: Secondary | ICD-10-CM | POA: Diagnosis not present

## 2024-03-08 DIAGNOSIS — Z79891 Long term (current) use of opiate analgesic: Secondary | ICD-10-CM | POA: Diagnosis not present

## 2024-03-08 DIAGNOSIS — K219 Gastro-esophageal reflux disease without esophagitis: Secondary | ICD-10-CM

## 2024-03-08 DIAGNOSIS — E1122 Type 2 diabetes mellitus with diabetic chronic kidney disease: Secondary | ICD-10-CM | POA: Diagnosis not present

## 2024-03-08 DIAGNOSIS — K8050 Calculus of bile duct without cholangitis or cholecystitis without obstruction: Secondary | ICD-10-CM | POA: Diagnosis not present

## 2024-03-08 DIAGNOSIS — Z0001 Encounter for general adult medical examination with abnormal findings: Secondary | ICD-10-CM | POA: Diagnosis not present

## 2024-03-08 DIAGNOSIS — E1169 Type 2 diabetes mellitus with other specified complication: Secondary | ICD-10-CM | POA: Diagnosis not present

## 2024-03-08 DIAGNOSIS — E119 Type 2 diabetes mellitus without complications: Secondary | ICD-10-CM | POA: Diagnosis not present

## 2024-03-08 DIAGNOSIS — I5032 Chronic diastolic (congestive) heart failure: Secondary | ICD-10-CM | POA: Diagnosis not present

## 2024-03-08 DIAGNOSIS — Z0000 Encounter for general adult medical examination without abnormal findings: Secondary | ICD-10-CM

## 2024-03-08 DIAGNOSIS — I130 Hypertensive heart and chronic kidney disease with heart failure and stage 1 through stage 4 chronic kidney disease, or unspecified chronic kidney disease: Secondary | ICD-10-CM | POA: Diagnosis not present

## 2024-03-08 DIAGNOSIS — Z794 Long term (current) use of insulin: Secondary | ICD-10-CM | POA: Diagnosis not present

## 2024-03-08 DIAGNOSIS — F3342 Major depressive disorder, recurrent, in full remission: Secondary | ICD-10-CM

## 2024-03-08 DIAGNOSIS — F411 Generalized anxiety disorder: Secondary | ICD-10-CM

## 2024-03-08 DIAGNOSIS — R2681 Unsteadiness on feet: Secondary | ICD-10-CM

## 2024-03-08 DIAGNOSIS — I69354 Hemiplegia and hemiparesis following cerebral infarction affecting left non-dominant side: Secondary | ICD-10-CM | POA: Diagnosis not present

## 2024-03-08 DIAGNOSIS — Z7902 Long term (current) use of antithrombotics/antiplatelets: Secondary | ICD-10-CM | POA: Diagnosis not present

## 2024-03-08 LAB — BAYER DCA HB A1C WAIVED: HB A1C (BAYER DCA - WAIVED): 9.1 % — ABNORMAL HIGH (ref 4.8–5.6)

## 2024-03-08 LAB — LIPID PANEL

## 2024-03-08 MED ORDER — LANTUS SOLOSTAR 100 UNIT/ML ~~LOC~~ SOPN: 48.0000 [IU] | PEN_INJECTOR | Freq: Every day | SUBCUTANEOUS | 5 refills | Status: AC

## 2024-03-08 MED ORDER — OLMESARTAN MEDOXOMIL 40 MG PO TABS: 40.0000 mg | ORAL_TABLET | Freq: Every day | ORAL | 1 refills | Status: DC

## 2024-03-08 MED ORDER — POTASSIUM CHLORIDE ER 10 MEQ PO TBCR: 10.0000 meq | EXTENDED_RELEASE_TABLET | Freq: Every day | ORAL | 1 refills | Status: DC

## 2024-03-08 MED ORDER — METOPROLOL SUCCINATE ER 25 MG PO TB24: 25.0000 mg | ORAL_TABLET | Freq: Every day | ORAL | 1 refills | Status: DC

## 2024-03-08 MED ORDER — CITALOPRAM HYDROBROMIDE 20 MG PO TABS: 20.0000 mg | ORAL_TABLET | Freq: Every day | ORAL | 1 refills | Status: DC

## 2024-03-08 MED ORDER — ATORVASTATIN CALCIUM 20 MG PO TABS: 20.0000 mg | ORAL_TABLET | Freq: Every day | ORAL | 1 refills | Status: DC

## 2024-03-08 MED ORDER — LORAZEPAM 0.5 MG PO TABS: 0.5000 mg | ORAL_TABLET | Freq: Two times a day (BID) | ORAL | 5 refills | Status: DC

## 2024-03-08 MED ORDER — AMLODIPINE BESYLATE 10 MG PO TABS: 10.0000 mg | ORAL_TABLET | Freq: Every day | ORAL | 1 refills | Status: DC

## 2024-03-08 MED ORDER — TOLTERODINE TARTRATE ER 4 MG PO CP24: 4.0000 mg | ORAL_CAPSULE | Freq: Every day | ORAL | 1 refills | Status: DC

## 2024-03-08 MED ORDER — PANTOPRAZOLE SODIUM 40 MG PO TBEC: 40.0000 mg | DELAYED_RELEASE_TABLET | Freq: Every day | ORAL | 1 refills | Status: DC

## 2024-03-08 NOTE — Progress Notes (Addendum)
 Subjective:    Patient ID: Kara Dean, female    DOB: [birth year], 89 y.o.   MRN: [id number]   Chief Complaint: Kara Dean   HPI:  Kara Dean is a 89 y.o. who identifies as a female who was assigned female at birth.   Social history: Lives with: granddaughter Work history: retired   Water engineer in today for follow up of the following chronic medical issues:  1. Essential hypertension No c/o chest pain, sob and headache. Does not check blood pressure at home. BP Readings from Last 3 Encounters:  02/13/24 (!) 144/54  01/21/24 (!) 141/61  11/18/23 (!) 156/56     2. Hyperlipidemia associated with type 2 diabetes mellitus (HCC) Does not watch diet very closely. Has a poor appetite. Lab Results  Component Value Date   CHOL 109 01/21/2024   HDL 41 01/21/2024   LDLCALC 41 01/21/2024   TRIG 162 (H) 01/21/2024   CHOLHDL 2.7 01/21/2024     3. Diabetes mellitus with insulin  therapy (HCC) Doe snot check blood sugars very often at home. Runs over 200 consistently. Has had some readings I the 400 when she checks them. Lab Results  Component Value Date   HGBA1C 8.5 (H) 01/21/2024      4. Gastroesophageal reflux disease without esophagitis Takes protonix  daily which works well for her.  5. Chronic kidney disease (CKD) stage G3a/A3, moderately decreased glomerular filtration rate (GFR) between 45-59 mL/min/1.73 square meter and albuminuria creatinine ratio greater than 300 mg/g (HCC) No voiding issue Lab Results  Component Value Date   CREATININE 1.21 (H) 10/18/2023     6. History  of cerebrovascular accident (CVA) No permanent effects  7. Peripheral edema Has occasionally  8. Recurrent major depressive disorder, in full remission (HCC) 9. GAD (generalized anxiety disorder) Is on celexa  daily and is doing well.    09/05/2023   12:10 PM 06/30/2023    2:55 PM 06/24/2023    9:45 AM 03/07/2023    2:03 PM  GAD 7 : Generalized Anxiety Score  Nervous, Anxious, on  Edge 2 1 0 1  Control/stop worrying 1 1 1 1   Worry too much - different things 1 1 2 1   Trouble relaxing 0 1 1 0  Restless 0 1 0 0  Easily annoyed or irritable 0 1 1 0  Afraid - awful might happen 1 1 2  0  Total GAD 7 Score 5 7 7 3   Anxiety Difficulty Not difficult at all Somewhat difficult Not difficult at all        09/05/2023   12:09 PM 06/30/2023    2:54 PM 06/24/2023    9:45 AM  Depression screen PHQ 2/9  Decreased Interest 1 1 1   Down, Depressed, Hopeless 2 1 0  PHQ - 2 Score 3 2 1   Altered sleeping 1 0 1  Tired, decreased energy 2 0 2  Change in appetite 1 1 2   Feeling bad or failure about yourself  1 0 0  Trouble concentrating 1 0 2  Moving slowly or fidgety/restless 0 0 0  Suicidal thoughts 0 0 0  PHQ-9 Score 9 3 8   Difficult doing work/chores Not difficult at all Not difficult at all Somewhat difficult      New complaints: None today  Allergies  Allergen Reactions   Codeine Other (See Comments)    unknown   Latex Rash   Morphine Other (See Comments)    unknown   Niacin Other (See Comments)  unknown   Sulfa Antibiotics Rash   Sulfonamide Derivatives Rash   Outpatient Encounter Medications as of 03/08/2024  Medication Sig   amLODipine  (NORVASC ) 10 MG tablet Take 1 tablet (10 mg total) by mouth daily.   aspirin  81 MG chewable tablet Chew 1 tablet (81 mg total) by mouth daily.   atorvastatin  (LIPITOR) 20 MG tablet TAKE ONE TABLET ONCE DAILY   citalopram  (CELEXA ) 20 MG tablet Take 1 tablet (20 mg total) by mouth daily.   clopidogrel  (PLAVIX ) 75 MG tablet Take 1 tablet (75 mg total) by mouth daily. (Patient not taking: Reported on 02/13/2024)   fluticasone  (FLONASE ) 50 MCG/ACT nasal spray USE 2 SPRAYS IN EACH NOSTRIL ONCE DAILY   furosemide  (LASIX ) 20 MG tablet Take 20 mg by mouth daily.   Insulin  Pen Needle (PX EXTRA SHORT PEN NEEDLES) 31G X 6 MM MISC Use as directed   LANTUS  SOLOSTAR 100 UNIT/ML Solostar Pen Inject 40 Units into the skin daily.   LORazepam   (ATIVAN ) 0.5 MG tablet Take 1 tablet (0.5 mg total) by mouth 2 (two) times daily.   metoprolol  succinate (TOPROL -XL) 25 MG 24 hr tablet Take 1 tablet (25 mg total) by mouth daily.   olmesartan  (BENICAR ) 40 MG tablet TAKE ONE TABLET DAILY   ONETOUCH ULTRA test strip CHECK BLOOD SUGER UP TO 3 TIMES A DAY   pantoprazole  (PROTONIX ) 40 MG tablet TAKE ONE TABLET DAILY   potassium chloride  (KLOR-CON ) 10 MEQ tablet Take 1 tablet (10 mEq total) by mouth daily.   tolterodine  (DETROL  LA) 4 MG 24 hr capsule Take 1 capsule (4 mg total) by mouth daily.   No facility-administered encounter medications on file as of 03/08/2024.    Past Surgical History:  Procedure Laterality Date   ABDOMINAL HYSTERECTOMY     BACK SURGERY  1994   CATARACT EXTRACTION W/PHACO Left 09/19/2017   Procedure: CATARACT EXTRACTION PHACO AND INTRAOCULAR LENS PLACEMENT (IOC);  Surgeon: Tarri Farm, MD;  Location: [facility];  Service: Ophthalmology;  Laterality: Left;  CDE: 8.10   CATARACT EXTRACTION W/PHACO Right 10/10/2017   Procedure: CATARACT EXTRACTION PHACO AND INTRAOCULAR LENS PLACEMENT RIGHT EYE;  Surgeon: Tarri Farm, MD;  Location: [facility];  Service: Ophthalmology;  Laterality: Right;  CDE: 9.60   CHOLECYSTECTOMY N/A 05/10/2021   Procedure: LAPAROSCOPIC CHOLECYSTECTOMY;  Surgeon: Adalberto Acton, MD;  Location: [facility];  Service: General;  Laterality: N/A;   COLON RESECTION  1990's   TUBAL LIGATION     VESICOVAGINAL FISTULA CLOSURE W/ TAH  1984    Family History  Problem Relation Age of Onset   Coronary artery disease Sister        stent x 3   Seizures Sister    Coronary artery disease Brother        stent   Hypertension Mother    Stroke Mother    Emphysema Father    Alcohol abuse Brother    Lung disease Brother    Heart attack Daughter       Controlled substance contract: 12/11/22     Review of Systems  Constitutional:  Negative for diaphoresis.  Eyes:  Negative for pain.  Respiratory:  Negative for  shortness of breath.   Cardiovascular:  Negative for chest pain, palpitations and leg swelling.  Gastrointestinal:  Negative for abdominal pain.  Endocrine: Negative for polydipsia.  Skin:  Negative for rash.  Neurological:  Negative for dizziness, weakness and headaches.  Hematological:  Does not bruise/bleed easily.  All other systems reviewed and are  negative.      Objective:   Physical Exam Vitals and nursing note reviewed.  Constitutional:      General: She is not in acute distress.    Appearance: Normal appearance. She is well-developed.  HENT:     Head: Normocephalic.     Right Ear: Tympanic membrane normal.     Left Ear: Tympanic membrane normal.     Nose: Nose normal.     Mouth/Throat:     Mouth: Mucous membranes are moist.  Eyes:     Pupils: Pupils are equal, round, and reactive to light.  Neck:     Vascular: No carotid bruit or JVD.  Cardiovascular:     Rate and Rhythm: Normal rate and regular rhythm.     Heart sounds: Normal heart sounds.  Pulmonary:     Effort: Pulmonary effort is normal. No respiratory distress.     Breath sounds: Normal breath sounds. No wheezing or rales.  Chest:     Chest wall: No tenderness.  Abdominal:     General: Bowel sounds are normal. There is no distension or abdominal bruit.     Palpations: Abdomen is soft. There is no hepatomegaly, splenomegaly, mass or pulsatile mass.     Tenderness: There is no abdominal tenderness.  Musculoskeletal:        General: Normal range of motion.     Cervical back: Normal range of motion and neck supple.  Lymphadenopathy:     Cervical: No cervical adenopathy.  Skin:    General: Skin is warm and dry.  Neurological:     Mental Status: She is alert and oriented to person, place, and time.     Deep Tendon Reflexes: Reflexes are normal and symmetric.  Psychiatric:        Behavior: Behavior normal.        Thought Content: Thought content normal.        Judgment: Judgment normal.    BP 126/62    Pulse 67   Temp 98.4 F (36.9 C) (Temporal)   Ht 5' (1.524 m)   Wt 121 lb (54.9 kg)   SpO2 98%   BMI 23.63 kg/m     HGBA1c 9.1%     Assessment & Plan:   Kara Dean comes in today with chief complaint of annual physical  Diagnosis and orders addressed:  1. Essential hypertension Low sodium diet - CBC with Differential/Platelet - CMP14+EGFR - amLODipine  (NORVASC ) 10 MG tablet; Take 1 tablet (10 mg total) by mouth daily.  Dispense: 90 tablet; Refill: 1 - metoprolol  succinate (TOPROL -XL) 25 MG 24 hr tablet; Take 1 tablet (25 mg total) by mouth daily.  Dispense: 90 tablet; Refill: 1 - olmesartan  (BENICAR ) 40 MG tablet; Take 1 tablet (40 mg total) by mouth daily.  Dispense: 90 tablet; Refill: 1 - potassium chloride  (KLOR-CON ) 10 MEQ tablet; Take 1 tablet (10 mEq total) by mouth as directed. Mon, Wed, and Fri only.  2. Hyperlipidemia associated with type 2 diabetes mellitus (HCC) Low fat diet - Lipid panel - atorvastatin  (LIPITOR) 10 MG tablet; Take 1 tablet (10 mg total) by mouth daily. (NEEDS TO BE SEEN BEFORE NEXT REFILL)  Dispense: 90 tablet; Refill: 1  3. Diabetes mellitus with insulin  therapy (HCC) Continue to watch carbs in diet - Bayer DCA Hb A1c Waived - LANTUS  SOLOSTAR 100 UNIT/ML Solostar Pen; Inject 40 Units into the skin daily.  Dispense: 15 mL; Refill: 5  4. Gastroesophageal reflux disease without esophagitis Avoid spicy foods Do not eat  2 hours prior to bedtime  - pantoprazole  (PROTONIX ) 40 MG tablet; Take 1 tablet (40 mg total) by mouth daily. (NEEDS TO BE SEEN BEFORE NEXT REFILL)  Dispense: 90 tablet; Refill: 1  5. Chronic kidney disease (CKD) stage G3a/A3, moderately decreased glomerular filtration rate (GFR) between 45-59 mL/min/1.73 square meter and albuminuria creatinine ratio greater than 300 mg/g (HCC) Labs pending  6. Late effect of cerebrovascular accident (CVA) Report any weakness - dipyridamole -aspirin  (AGGRENOX ) 200-25 MG 12hr capsule; Take 1  capsule by mouth daily.  Dispense: 90 capsule; Refill: 1  7. Peripheral edema Elevtae legs when sitting - furosemide  (LASIX ) 20 MG tablet; Take 1 tablet (20 mg total) by mouth daily.  Dispense: 90 tablet; Refill: 1  8. Recurrent major depressive disorder, in full remission (HCC) Stress management  9. GAD (generalized anxiety disorder) - citalopram  (CELEXA ) 20 MG tablet; Take 1 tablet (20 mg total) by mouth daily.  Dispense: 90 tablet; Refill: 1 - LORazepam  (ATIVAN ) 0.5 MG tablet; Take 1 tablet (0.5 mg total) by mouth 2 (two) times daily.  Dispense: 60 tablet; Refill: 5  10. Unsteady gait Order to continue pt at home  Labs pending Health Maintenance reviewed Diet and exercise encouraged  Follow up plan: 6 month   Mary-Margaret Gaylyn Keas, FNP

## 2024-03-08 NOTE — Addendum Note (Signed)
 Addended by: Delfina Feller on: 03/08/2024 11:49 AM   Modules accepted: Orders, Level of Service

## 2024-03-09 ENCOUNTER — Telehealth: Payer: Self-pay

## 2024-03-09 LAB — CBC WITH DIFFERENTIAL/PLATELET
Basophils Absolute: 0.1 10*3/uL (ref 0.0–0.2)
Basos: 1 %
EOS (ABSOLUTE): 0.3 10*3/uL (ref 0.0–0.4)
Eos: 4 %
Hematocrit: 40.7 % (ref 34.0–46.6)
Hemoglobin: 13.2 g/dL (ref 11.1–15.9)
Immature Grans (Abs): 0 10*3/uL (ref 0.0–0.1)
Immature Granulocytes: 0 %
Lymphocytes Absolute: 1.4 10*3/uL (ref 0.7–3.1)
Lymphs: 15 %
MCH: 30.4 pg (ref 26.6–33.0)
MCHC: 32.4 g/dL (ref 31.5–35.7)
MCV: 94 fL (ref 79–97)
Monocytes Absolute: 0.7 10*3/uL (ref 0.1–0.9)
Monocytes: 8 %
Neutrophils Absolute: 6.4 10*3/uL (ref 1.4–7.0)
Neutrophils: 72 %
Platelets: 204 10*3/uL (ref 150–450)
RBC: 4.34 x10E6/uL (ref 3.77–5.28)
RDW: 12 % (ref 11.7–15.4)
WBC: 8.9 10*3/uL (ref 3.4–10.8)

## 2024-03-09 LAB — CMP14+EGFR
ALT: 11 IU/L (ref 0–32)
AST: 13 IU/L (ref 0–40)
Albumin: 4.1 g/dL (ref 3.7–4.7)
Alkaline Phosphatase: 95 IU/L (ref 44–121)
BUN/Creatinine Ratio: 12 (ref 12–28)
BUN: 18 mg/dL (ref 8–27)
Bilirubin Total: 0.5 mg/dL (ref 0.0–1.2)
CO2: 22 mmol/L (ref 20–29)
Calcium: 10.2 mg/dL (ref 8.7–10.3)
Chloride: 98 mmol/L (ref 96–106)
Creatinine, Ser: 1.45 mg/dL — ABNORMAL HIGH (ref 0.57–1.00)
Globulin, Total: 2.8 g/dL (ref 1.5–4.5)
Glucose: 365 mg/dL — ABNORMAL HIGH (ref 70–99)
Potassium: 4.6 mmol/L (ref 3.5–5.2)
Sodium: 136 mmol/L (ref 134–144)
Total Protein: 6.9 g/dL (ref 6.0–8.5)
eGFR: 35 mL/min/{1.73_m2} — ABNORMAL LOW (ref >59)

## 2024-03-09 LAB — LIPID PANEL
Cholesterol, Total: 120 mg/dL (ref 100–199)
HDL: 44 mg/dL (ref >39)
LDL CALC COMMENT:: 2.7 ratio (ref 0.0–4.4)
LDL Chol Calc (NIH): 45 mg/dL (ref 0–99)
Triglycerides: 190 mg/dL — ABNORMAL HIGH (ref 0–149)
VLDL Cholesterol Cal: 31 mg/dL (ref 5–40)

## 2024-03-09 NOTE — Telephone Encounter (Signed)
**Note De-identified  [place] Obfuscation** Please advise 

## 2024-03-09 NOTE — Telephone Encounter (Signed)
 Copied from CRM [id number]. Topic: General - Other >> Mar 09, 2024  2:36 PM DeAngela L wrote: Reason for CRM: Patient daughter Carles Cheadle calling to ask if the DR can switch the patients home health back to [facility] she really prefers physical Therapist Cornel Diesel his cell number is [phone] he has been doing he PT the entire time after she had her stroke and he is also aware she is requesting him  Daughter Carles Cheadle number [phone] (M)

## 2024-03-11 LAB — TOXASSURE SELECT 13 (MW), URINE

## 2024-03-11 NOTE — Telephone Encounter (Signed)
 I have sent a [employer] to [employer] with [facility] to verify if he can accept Patient.

## 2024-03-15 ENCOUNTER — Ambulatory Visit (INDEPENDENT_AMBULATORY_CARE_PROVIDER_SITE_OTHER): Payer: 59

## 2024-03-15 VITALS — BP 126/62 | HR 67 | Ht 61.0 in | Wt 121.0 lb

## 2024-03-15 DIAGNOSIS — Z0000 Encounter for general adult medical examination without abnormal findings: Secondary | ICD-10-CM | POA: Diagnosis not present

## 2024-03-15 NOTE — Patient Instructions (Signed)
 Kara Dean , Thank you for taking time out of your busy schedule to complete your Annual Wellness Visit with me. I enjoyed our conversation and look forward to speaking with you again next year. I, as well as your care team,  appreciate your ongoing commitment to your health goals. Please review the following plan we discussed and let me know if I can assist you in the future.   Follow up Visits: Next Medicare AWV with our clinical staff: Wed., 03/26/24 @ 10:40a.m   Have you seen your provider in the last 6 months (3 months if uncontrolled diabetes)? Yes Next Office Visit with your provider: n/a  Clinician Recommendations:  Aim for 30 minutes of exercise or brisk walking, 6-8 glasses of water, and 5 servings of fruits and vegetables each day. Please remember to make your diabetic eye exam as soon as possible. Also at your next appt with your provider remember to get your foot exam since it due as well. If you have any questions please call the office at [phone].      This is a list of the screening recommended for you and due dates:  Health Maintenance  Topic Date Due   Eye exam for diabetics  03/04/2024   Complete foot exam   03/06/2024   COVID-19 Vaccine (4 - Moderna risk 2024-25 season) 03/24/2024*   Flu Shot  06/04/2024   Hemoglobin A1C  09/08/2024   Medicare Annual Wellness Visit  03/15/2025   DTaP/Tdap/Td vaccine (4 - Td or Tdap) 11/06/2032   Pneumonia Vaccine  Completed   DEXA scan (bone density measurement)  Completed   Zoster (Shingles) Vaccine  Completed   HPV Vaccine  Aged Out   Meningitis B Vaccine  Aged Out  *Topic was postponed. The date shown is not the original due date.    Advanced directives: (Declined) Advance directive discussed with you today. Even though you declined this today, please call our office should you change your mind, and we can give you the proper paperwork for you to fill out. Advance Care Planning is important because it:  [x]  Makes sure you  receive the medical care that is consistent with your values, goals, and preferences  [x]  It provides guidance to your family and loved ones and reduces their decisional burden about whether or not they are making the right decisions based on your wishes.  Follow the link provided in your after visit summary or read over the paperwork we have mailed to you to help you started getting your Advance Directives in place. If you need assistance in completing these, please reach out to us  so that we can help you!  See attachments for Preventive Care and Fall Prevention Tips.

## 2024-03-15 NOTE — Progress Notes (Signed)
 Subjective:   Kara Dean is a 89 y.o. who presents for a Medicare Wellness preventive visit.  As a reminder, Annual Wellness Visits don't include a physical exam, and some assessments may be limited, especially if this visit is performed virtually. We may recommend an in-person visit if needed.  Visit Complete: Virtual I connected with  Orene Binning on 03/15/24 by a audio enabled telemedicine application and verified that I am speaking with the correct person using two identifiers.  Patient Location: Home  Provider Location: Home Office  I discussed the limitations of evaluation and management by telemedicine. The patient expressed understanding and agreed to proceed.  Vital Signs: Because this visit was a virtual/telehealth visit, some criteria may be missing or patient reported. Any vitals not documented were not able to be obtained and vitals that have been documented are patient reported.  VideoError- Librarian, academic were attempted between this provider and patient, however failed, due to patient having technical difficulties OR patient did not have access to video capability.  We continued and completed visit with audio only.   Persons Participating in Visit: Patient assisted by with pt's daughter, Carles Cheadle.  AWV Questionnaire: No: Patient Medicare AWV questionnaire was not completed prior to this visit.  Cardiac Risk Factors include: advanced age (>53men, >61 women);diabetes mellitus;dyslipidemia (Acute ischemic stroke (HCC))     Objective:     Today's Vitals   03/15/24 1033  BP: 126/62  Pulse: 67  Weight: 121 lb (54.9 kg)  Height: 5\' 1"  (1.549 m)   Body mass index is 22.86 kg/m.     03/15/2024   10:52 AM 10/18/2023    5:28 PM 10/18/2023    3:01 PM 03/12/2023   12:07 PM 02/20/2022   11:38 AM 05/06/2021    8:31 PM 02/19/2021   10:33 AM  Advanced Directives  Does Patient Have a Medical Advance Directive? No No No No No No No  Would  patient like information on creating a medical advance directive?  No - Patient declined No - Patient declined No - Patient declined No - Patient declined Yes (ED - Information included in AVS) Yes (MAU/Ambulatory/Procedural Areas - Information given)    Current Medications (verified) Outpatient Encounter Medications as of 03/15/2024  Medication Sig   amLODipine  (NORVASC ) 10 MG tablet Take 1 tablet (10 mg total) by mouth daily.   aspirin  81 MG chewable tablet Chew 1 tablet (81 mg total) by mouth daily.   atorvastatin  (LIPITOR) 20 MG tablet Take 1 tablet (20 mg total) by mouth daily.   citalopram  (CELEXA ) 20 MG tablet Take 1 tablet (20 mg total) by mouth daily.   fluticasone  (FLONASE ) 50 MCG/ACT nasal spray USE 2 SPRAYS IN EACH NOSTRIL ONCE DAILY   furosemide  (LASIX ) 20 MG tablet Take 20 mg by mouth daily.   Insulin  Pen Needle (PX EXTRA SHORT PEN NEEDLES) 31G X 6 MM MISC Use as directed   LANTUS  SOLOSTAR 100 UNIT/ML Solostar Pen Inject 48 Units into the skin daily.   LORazepam  (ATIVAN ) 0.5 MG tablet Take 1 tablet (0.5 mg total) by mouth 2 (two) times daily.   metoprolol  succinate (TOPROL -XL) 25 MG 24 hr tablet Take 1 tablet (25 mg total) by mouth daily.   olmesartan  (BENICAR ) 40 MG tablet Take 1 tablet (40 mg total) by mouth daily.   ONETOUCH ULTRA test strip CHECK BLOOD SUGER UP TO 3 TIMES A DAY   pantoprazole  (PROTONIX ) 40 MG tablet Take 1 tablet (40 mg total) by mouth  daily.   potassium chloride  (KLOR-CON ) 10 MEQ tablet Take 1 tablet (10 mEq total) by mouth daily.   tolterodine  (DETROL  LA) 4 MG 24 hr capsule Take 1 capsule (4 mg total) by mouth daily.   No facility-administered encounter medications on file as of 03/15/2024.    Allergies (verified) Codeine, Latex, Morphine, Niacin, Sulfa antibiotics, and Sulfonamide derivatives   History: Past Medical History:  Diagnosis Date   Anxiety    Carotid artery disease (HCC)    Carotid US  11/2018:  bilateral ICA 1-39 >> repeat 2 years.     Cataract    Crohn's colitis (HCC)    CVA (cerebral infarction)    No deficits   Depression    Diabetes mellitus    type 2   Dyslipidemia    GERD (gastroesophageal reflux disease)    History of TIAs    no deficits   Hyperlipidemia    Hypertension    Hyponatremia    Late effect of cerebrovascular accident (CVA) 03/26/2010   Monitor 12/2023: no atrial fibrillation         Nodular basal cell carcinoma (BCC) 04/22/2018   left prox, nasal root (tx p bx)   Osteoarthritis    SCC (squamous cell carcinoma) 04/22/2018   left shin,superior-(tx p bx), Left shin,inferior-(tx p bx )   Past Surgical History:  Procedure Laterality Date   ABDOMINAL HYSTERECTOMY     BACK SURGERY  1994   CATARACT EXTRACTION W/PHACO Left 09/19/2017   Procedure: CATARACT EXTRACTION PHACO AND INTRAOCULAR LENS PLACEMENT (IOC);  Surgeon: Tarri Farm, MD;  Location: [facility];  Service: Ophthalmology;  Laterality: Left;  CDE: 8.10   CATARACT EXTRACTION W/PHACO Right 10/10/2017   Procedure: CATARACT EXTRACTION PHACO AND INTRAOCULAR LENS PLACEMENT RIGHT EYE;  Surgeon: Tarri Farm, MD;  Location: [facility];  Service: Ophthalmology;  Laterality: Right;  CDE: 9.60   CHOLECYSTECTOMY N/A 05/10/2021   Procedure: LAPAROSCOPIC CHOLECYSTECTOMY;  Surgeon: Adalberto Acton, MD;  Location: [facility];  Service: General;  Laterality: N/A;   COLON RESECTION  1990's   TUBAL LIGATION     VESICOVAGINAL FISTULA CLOSURE W/ TAH  1984   Family History  Problem Relation Age of Onset   Coronary artery disease Sister        stent x 3   Seizures Sister    Coronary artery disease Brother        stent   Hypertension Mother    Stroke Mother    Emphysema Father    Alcohol abuse Brother    Lung disease Brother    Heart attack Daughter    Social History   Socioeconomic History   Marital status: Widowed    Spouse name: Not on file   Number of children: 6   Years of education: 10   Highest education level: 10th grade  Occupational History    Occupation: Retired    Comment: [employer]  Tobacco Use   Smoking status: Never    Passive exposure: Current   Smokeless tobacco: Never  Vaping Use   Vaping status: Never Used  Substance and Sexual Activity   Alcohol use: No   Drug use: No   Sexual activity: Not Currently  Other Topics Concern   Not on file  Social History Narrative   Mrs Ciccarelli is retired and widowed. She lives at home and her granddaughter lives with her. She lives within walking distance to her 2 daughter's houses. Has an indoor cat. Her sister moved to [place]  and she doesn't get to  get to visit with her often but does talk with her on the phone. She has 6 children and many grandchildren and great grandchildren.    Social Drivers of Corporate investment banker Strain: Low Risk  (03/15/2024)   Overall Financial Resource Strain (CARDIA)    Difficulty of Paying Living Expenses: Not hard at all  Food Insecurity: No Food Insecurity (03/15/2024)   Hunger Vital Sign    Worried About Running Out of Food in the Last Year: Never true    Ran Out of Food in the Last Year: Never true  Transportation Needs: No Transportation Needs (03/15/2024)   PRAPARE - Administrator, Civil Service (Medical): No    Lack of Transportation (Non-Medical): No  Physical Activity: Insufficiently Active (03/15/2024)   Exercise Vital Sign    Days of Exercise per Week: 3 days    Minutes of Exercise per Session: 30 min  Stress: No Stress Concern Present (03/15/2024)   [employer] of Occupational Health - Occupational Stress Questionnaire    Feeling of Stress : Not at all  Social Connections: Socially Isolated (03/15/2024)   Social Connection and Isolation Panel [NHANES]    Frequency of Communication with Friends and Family: More than three times a week    Frequency of Social Gatherings with Friends and Family: More than three times a week    Attends Religious Services: Never    Database administrator or Organizations: No    Attends  Banker Meetings: Never    Marital Status: Widowed    Tobacco Counseling Counseling given: Yes    Clinical Intake:  Pre-visit preparation completed: Yes  Pain : No/denies pain     BMI - recorded: 22.86 Nutritional Status: BMI of 19-24  Normal Nutritional Risks: None Diabetes: Yes CBG done?: No (94)  Lab Results  Component Value Date   HGBA1C 9.1 (H) 03/08/2024   HGBA1C 8.5 (H) 01/21/2024   HGBA1C 7.3 (H) 10/18/2023     How often do you need to have someone help you when you read instructions, pamphlets, or other written materials from your doctor or pharmacy?: 1 - Never  Interpreter Needed?: No  Comments: awv was assisted w/pt's daughter, Lark Plum as well Information entered by :: Anola Basques T/cma   Activities of Daily Living     03/15/2024   10:46 AM 10/18/2023    5:28 PM  In your present state of health, do you have any difficulty performing the following activities:  Hearing? 0 0  Vision? 1 0  Comment Pt wear glasses and still having issues w/vision/ pt goes to [facility] [address] In [place], [place]  Last visit is July 2024   Difficulty concentrating or making decisions? 1 0  Comment sometimes pt forgets   Walking or climbing stairs? 1   Dressing or bathing? 1   Comment my daughter is pt's care taker, Lark Plum ie bathing   Doing errands, shopping? 1 1  Comment my daughter is pt's care taker, Bronwen Canon and eating ? N   Using the Toilet? N   In the past six months, have you accidently leaked urine? Y   Do you have problems with loss of bowel control? Y   Managing your Medications? Y   Comment my daughter is pt's care taker, Lark Plum   Managing your Finances? Y   Comment my daughter is pt's care taker, Lark Plum   Housekeeping or managing your Housekeeping? Y   Comment  my daughter is pt's care taker, Lark Plum     Patient Care Team: Delfina Feller, FNP as PCP - General (Nurse Practitioner) Arnoldo Lapping, MD as PCP - Cardiology (Cardiology) Mealor, Donnamae Gaba, MD as PCP - Electrophysiology (Cardiology) Tarri Farm, MD as Consulting Physician (Ophthalmology) Johana Musty, OD (Optometry) Kennieth Peaches Kelli R, PA-C as Physician Assistant (Dermatology) Suann Elms, LCSW as Social Worker (Licensed Clinical Social Worker) Reyne Cave, Donata Fryer as Physician Assistant (Cardiology)  Indicate any recent Medical Services you may have received from other than [facility] providers in the past year (date may be approximate).     Assessment:    This is a routine wellness examination for Josephine.  Hearing/Vision screen Hearing Screening - Comments:: Pt stated yes/last hearing test at least 1 yr  Vision Screening - Comments:: Pt wear glasses and still having issues w/vision/ pt goes to [facility] [address] In [place], [place] Last visit is July 2024,    Goals Addressed             This Visit's Progress    DIET - INCREASE WATER INTAKE   On track    Exercise 150 min/wk Moderate Activity   On track    Chair exercises or walking daily for 30 minutes     Manage Anxiety and Depression issues faced. Manage My Emotions (Patient)   On track    Timeframe:  Short-Term Goal Priority:  Medium Progress: On Track Start Date:           01/31/22                  Expected End Date:   04/30/22    Follow Up Date  03/25/22 at 2:00 PM   Manage My Emotions (Patient)  Manage Anxiety and Depression issues faced    Why is this important?   When you are stressed, down or upset, your body reacts too.  For example, your blood pressure may get higher; you may have a headache or stomachache.  When your emotions get the best of you, your body's ability to fight off cold and flu gets weak.  These steps will help you manage your emotions.    Patient Self Care Activities:  Takes medications as prescribed Tries to complete ADLs as able  Patient Coping Strengths:  Has family support Attends scheduled medical  appointments Has no transport needs  Patient Self Care Deficits:  Decreased appetite Some mobility issues  Patient Goals:  - spend time or talk with others every day - practice relaxation or meditation daily - keep a calendar with appointment dates  Follow Up Plan:  LCSW to call client or her daughter, Carles Cheadle, on 03/25/22  at 2:00 PM to assess client needs         Depression Screen     03/15/2024   11:00 AM 03/08/2024   11:31 AM 09/05/2023   12:09 PM 06/30/2023    2:54 PM 06/24/2023    9:45 AM 03/12/2023   12:06 PM 03/07/2023    2:02 PM  PHQ 2/9 Scores  PHQ - 2 Score 3 2 3 2 1  0 0  PHQ- 9 Score 8 7 9 3 8  0 1    Fall Risk     03/15/2024   10:42 AM 03/08/2024   11:31 AM 09/05/2023   12:09 PM 06/30/2023    2:54 PM 06/24/2023    9:44 AM  Fall Risk   Falls in the past year? 1 1 1 1 1   Number  falls in past yr: 1 1 1 1 1   Comment had a stroke/been falling a lot during the beg the year      Injury with Fall? 1 0 0 0 1  Risk for fall due to : No Fall Risks;Impaired balance/gait;Other (Comment) History of fall(s) History of fall(s) History of fall(s)   Risk for fall due to: Comment due to stroke in back late 2024      Follow up Falls prevention discussed;Falls evaluation completed Education provided Education provided Education provided Falls prevention discussed    MEDICARE RISK AT HOME:  Medicare Risk at Home Any stairs in or around the home?: No If so, are there any without handrails?: No Home free of loose throw rugs in walkways, pet beds, electrical cords, etc?: Yes Adequate lighting in your home to reduce risk of falls?: Yes Life alert?: No Use of a cane, walker or w/c?: Yes (Walker/cane) Grab bars in the bathroom?: No Shower chair or bench in shower?: No Elevated toilet seat or a handicapped toilet?: Yes  TIMED UP AND GO:  Was the test performed?  no  Cognitive Function: 6CIT completed    07/26/2015    4:06 PM  MMSE - Mini Mental State Exam  Orientation to time 5   Orientation to Place 5  Registration 3  Attention/ Calculation 3  Recall 3  Language- name 2 objects 2  Language- repeat 1  Language- follow 3 step command 2  Language- read & follow direction 1  Write a sentence 1  Copy design 1  Total score 27        03/15/2024   11:06 AM 03/12/2023   12:07 PM 02/20/2022   11:30 AM 02/17/2020   10:15 AM 09/21/2018    3:48 PM  6CIT Screen  What Year? 0 points 0 points 0 points 0 points 0 points  What month? 0 points 0 points 0 points 0 points 0 points  What time? 0 points 0 points 0 points 0 points 0 points  Count back from 20 0 points 0 points 0 points 2 points 0 points  Months in reverse 0 points 0 points 0 points 0 points 0 points  Repeat phrase 10 points 0 points 0 points 2 points 0 points  Total Score 10 points 0 points 0 points 4 points 0 points    Immunizations Immunization History  Administered Date(s) Administered   Fluad Quad(high Dose 65+) 08/23/2020, 07/31/2021, 10/21/2022   Fluad Trivalent(High Dose 65+) 09/05/2023   Fluzone Influenza virus vaccine,trivalent (IIV3), split virus 08/10/2019   Influenza Split 07/23/2013, 07/24/2015   Influenza, Seasonal, Injecte, Preservative Fre 08/08/2009, 08/09/2010, 08/15/2011   Influenza,inj,Quad PF,6+ Mos 08/01/2017, 08/15/2018   Influenza,inj,quad, With Preservative 08/05/2016, 08/03/2019   Influenza-Unspecified 04/12/1935, 08/14/1937, 07/18/2014, 08/05/2016, 08/17/2018, 09/17/2018, 10/05/2019, 08/23/2020   Moderna Covid-19 Fall Seasonal Vaccine 68yrs & older 09/05/2023   Moderna Sars-Covid-2 Vaccination 03/20/2020, 04/17/2020   Pneumococcal Conjugate-13 07/18/2014   Pneumococcal Polysaccharide-23 08/04/2008, 08/17/2018   Pneumococcal-Unspecified 08/17/2018   Td 06/11/2005   Tdap 08/15/2014, 11/06/2022   Zoster Recombinant(Shingrix) 10/05/2019, 12/01/2019, 05/01/2021   Zoster, Live 08/07/2012    Screening Tests Health Maintenance  Topic Date Due   OPHTHALMOLOGY EXAM  03/04/2024    FOOT EXAM  03/06/2024   COVID-19 Vaccine (4 - Moderna risk 2024-25 season) 03/24/2024 (Originally 03/04/2024)   INFLUENZA VACCINE  06/04/2024   HEMOGLOBIN A1C  09/08/2024   Medicare Annual Wellness (AWV)  03/15/2025   DTaP/Tdap/Td (4 - Td or Tdap) 11/06/2032   Pneumonia Vaccine  31+ Years old  Completed   DEXA SCAN  Completed   Zoster Vaccines- Shingrix  Completed   HPV VACCINES  Aged Out   Meningococcal B Vaccine  Aged Out    Health Maintenance  Health Maintenance Due  Topic Date Due   OPHTHALMOLOGY EXAM  03/04/2024   FOOT EXAM  03/06/2024   Health Maintenance Items Addressed: See Nurse Notes  Additional Screening:  Vision Screening: Recommended annual ophthalmology exams for early detection of glaucoma and other disorders of the eye.  Dental Screening: Recommended annual dental exams for proper oral hygiene  Community Resource Referral / Chronic Care Management: CRR required this visit?  No   CCM required this visit?  No   Plan:    I have personally reviewed and noted the following in the patient's chart:   Medical and social history Use of alcohol, tobacco or illicit drugs  Current medications and supplements including opioid prescriptions. Patient is not currently taking opioid prescriptions. Functional ability and status Nutritional status Physical activity Advanced directives List of other physicians Hospitalizations, surgeries, and ER visits in previous 12 months Vitals Screenings to include cognitive, depression, and falls Referrals and appointments  In addition, I have reviewed and discussed with patient certain preventive protocols, quality metrics, and best practice recommendations. A written personalized care plan for preventive services as well as general preventive health recommendations were provided to patient.   Michaelle Adolphus, CMA   03/15/2024   After Visit Summary: (MyChart) Due to this being a telephonic visit, the after visit summary with  patients personalized plan was offered to patient via MyChart   Notes: Please remember to make your diabetic eye exam as soon as possible. Also at your next appt with your provider remember to get your foot exam since it due as well. If you have any questions please call the office at [phone]. Also see routing notes to pcp

## 2024-03-16 DIAGNOSIS — Z7982 Long term (current) use of aspirin: Secondary | ICD-10-CM | POA: Diagnosis not present

## 2024-03-16 DIAGNOSIS — K8050 Calculus of bile duct without cholangitis or cholecystitis without obstruction: Secondary | ICD-10-CM | POA: Diagnosis not present

## 2024-03-16 DIAGNOSIS — I69354 Hemiplegia and hemiparesis following cerebral infarction affecting left non-dominant side: Secondary | ICD-10-CM | POA: Diagnosis not present

## 2024-03-16 DIAGNOSIS — Z7902 Long term (current) use of antithrombotics/antiplatelets: Secondary | ICD-10-CM | POA: Diagnosis not present

## 2024-03-16 DIAGNOSIS — I130 Hypertensive heart and chronic kidney disease with heart failure and stage 1 through stage 4 chronic kidney disease, or unspecified chronic kidney disease: Secondary | ICD-10-CM | POA: Diagnosis not present

## 2024-03-16 DIAGNOSIS — I5032 Chronic diastolic (congestive) heart failure: Secondary | ICD-10-CM | POA: Diagnosis not present

## 2024-03-16 DIAGNOSIS — Z794 Long term (current) use of insulin: Secondary | ICD-10-CM | POA: Diagnosis not present

## 2024-03-16 DIAGNOSIS — N1831 Chronic kidney disease, stage 3a: Secondary | ICD-10-CM | POA: Diagnosis not present

## 2024-03-16 DIAGNOSIS — K219 Gastro-esophageal reflux disease without esophagitis: Secondary | ICD-10-CM | POA: Diagnosis not present

## 2024-03-16 DIAGNOSIS — E1122 Type 2 diabetes mellitus with diabetic chronic kidney disease: Secondary | ICD-10-CM | POA: Diagnosis not present

## 2024-03-16 DIAGNOSIS — E785 Hyperlipidemia, unspecified: Secondary | ICD-10-CM | POA: Diagnosis not present

## 2024-03-16 DIAGNOSIS — Z79899 Other long term (current) drug therapy: Secondary | ICD-10-CM | POA: Diagnosis not present

## 2024-03-16 DIAGNOSIS — E1169 Type 2 diabetes mellitus with other specified complication: Secondary | ICD-10-CM | POA: Diagnosis not present

## 2024-03-18 ENCOUNTER — Ambulatory Visit: Payer: 59 | Admitting: Cardiovascular Disease

## 2024-03-19 ENCOUNTER — Ambulatory Visit (INDEPENDENT_AMBULATORY_CARE_PROVIDER_SITE_OTHER)

## 2024-03-19 DIAGNOSIS — I639 Cerebral infarction, unspecified: Secondary | ICD-10-CM

## 2024-03-22 LAB — CUP PACEART REMOTE DEVICE CHECK
Date Time Interrogation Session: 20250516183931
Implantable Pulse Generator Implant Date: 20250411

## 2024-03-27 ENCOUNTER — Ambulatory Visit: Payer: Self-pay | Admitting: Cardiovascular Disease

## 2024-04-08 ENCOUNTER — Telehealth: Payer: Self-pay | Admitting: Nurse Practitioner

## 2024-04-08 ENCOUNTER — Other Ambulatory Visit: Payer: Self-pay | Admitting: Nurse Practitioner

## 2024-04-08 NOTE — Telephone Encounter (Signed)
 TC to [facility] recent referral was placed on 03/08/24 and was accepted by [facility] on 03/22/24. Pt was discharged by them on 03/16/24 from previous referral, they stated they have not received any new referral, will have our Referral Coordinator follow up on this.

## 2024-04-08 NOTE — Telephone Encounter (Signed)
 Copied from CRM [id number]. Topic: Referral - Question >> Apr 08, 2024  2:07 PM Zipporah Him wrote: Reason for CRM: [phone] please call peggy back in regard to referral for [facility]. It looks as though the referral was sent and approved but they were never contacted. She states no ones been out in weeks and patient is begning to have trouble again with gait and balance. Please call asap to advise on how to get her scheduled with referral. Requesting clarification from nurse please. CAL advised to route CRM to admin.

## 2024-04-09 NOTE — Telephone Encounter (Signed)
 Per my Referral Notes - I reached out to Kara Dean with [facility] whom accepted Patient. I will reach back out to him for clarification.

## 2024-04-17 ENCOUNTER — Encounter (HOSPITAL_COMMUNITY): Payer: Self-pay | Admitting: Emergency Medicine

## 2024-04-17 ENCOUNTER — Other Ambulatory Visit: Payer: Self-pay

## 2024-04-17 ENCOUNTER — Observation Stay (HOSPITAL_COMMUNITY)
Admission: EM | Admit: 2024-04-17 | Discharge: 2024-04-19 | Disposition: A | Discharge status: Home / Self Care | Attending: Internal Medicine | Admitting: Internal Medicine

## 2024-04-17 DIAGNOSIS — Z91040 Latex allergy status: Secondary | ICD-10-CM | POA: Insufficient documentation

## 2024-04-17 DIAGNOSIS — Z79899 Other long term (current) drug therapy: Secondary | ICD-10-CM | POA: Insufficient documentation

## 2024-04-17 DIAGNOSIS — I130 Hypertensive heart and chronic kidney disease with heart failure and stage 1 through stage 4 chronic kidney disease, or unspecified chronic kidney disease: Secondary | ICD-10-CM | POA: Diagnosis not present

## 2024-04-17 DIAGNOSIS — I5032 Chronic diastolic (congestive) heart failure: Secondary | ICD-10-CM | POA: Diagnosis present

## 2024-04-17 DIAGNOSIS — H579 Unspecified disorder of eye and adnexa: Secondary | ICD-10-CM | POA: Diagnosis not present

## 2024-04-17 DIAGNOSIS — E8809 Other disorders of plasma-protein metabolism, not elsewhere classified: Secondary | ICD-10-CM | POA: Insufficient documentation

## 2024-04-17 DIAGNOSIS — E1122 Type 2 diabetes mellitus with diabetic chronic kidney disease: Secondary | ICD-10-CM | POA: Insufficient documentation

## 2024-04-17 DIAGNOSIS — H538 Other visual disturbances: Secondary | ICD-10-CM | POA: Diagnosis not present

## 2024-04-17 DIAGNOSIS — I499 Cardiac arrhythmia, unspecified: Secondary | ICD-10-CM | POA: Diagnosis not present

## 2024-04-17 DIAGNOSIS — Z8679 Personal history of other diseases of the circulatory system: Secondary | ICD-10-CM | POA: Insufficient documentation

## 2024-04-17 DIAGNOSIS — R42 Dizziness and giddiness: Principal | ICD-10-CM

## 2024-04-17 DIAGNOSIS — K219 Gastro-esophageal reflux disease without esophagitis: Secondary | ICD-10-CM | POA: Diagnosis not present

## 2024-04-17 DIAGNOSIS — R770 Abnormality of albumin: Secondary | ICD-10-CM | POA: Insufficient documentation

## 2024-04-17 DIAGNOSIS — E46 Unspecified protein-calorie malnutrition: Secondary | ICD-10-CM | POA: Insufficient documentation

## 2024-04-17 DIAGNOSIS — R41841 Cognitive communication deficit: Secondary | ICD-10-CM | POA: Insufficient documentation

## 2024-04-17 DIAGNOSIS — R2681 Unsteadiness on feet: Secondary | ICD-10-CM | POA: Diagnosis present

## 2024-04-17 DIAGNOSIS — I2510 Atherosclerotic heart disease of native coronary artery without angina pectoris: Secondary | ICD-10-CM | POA: Diagnosis not present

## 2024-04-17 DIAGNOSIS — I10 Essential (primary) hypertension: Secondary | ICD-10-CM | POA: Diagnosis present

## 2024-04-17 DIAGNOSIS — E1165 Type 2 diabetes mellitus with hyperglycemia: Secondary | ICD-10-CM | POA: Diagnosis not present

## 2024-04-17 DIAGNOSIS — N1832 Chronic kidney disease, stage 3b: Secondary | ICD-10-CM | POA: Diagnosis present

## 2024-04-17 DIAGNOSIS — E782 Mixed hyperlipidemia: Secondary | ICD-10-CM | POA: Diagnosis not present

## 2024-04-17 DIAGNOSIS — R739 Hyperglycemia, unspecified: Secondary | ICD-10-CM | POA: Diagnosis not present

## 2024-04-17 DIAGNOSIS — E1169 Type 2 diabetes mellitus with other specified complication: Secondary | ICD-10-CM | POA: Diagnosis present

## 2024-04-17 DIAGNOSIS — R2689 Other abnormalities of gait and mobility: Secondary | ICD-10-CM

## 2024-04-17 DIAGNOSIS — H539 Unspecified visual disturbance: Secondary | ICD-10-CM | POA: Diagnosis not present

## 2024-04-17 DIAGNOSIS — Z7982 Long term (current) use of aspirin: Secondary | ICD-10-CM | POA: Insufficient documentation

## 2024-04-17 LAB — CBC WITH DIFFERENTIAL/PLATELET
Abs Immature Granulocytes: 0.02 10*3/uL (ref 0.00–0.07)
Basophils Absolute: 0.1 10*3/uL (ref 0.0–0.1)
Basophils Relative: 1 %
Eosinophils Absolute: 0.5 10*3/uL (ref 0.0–0.5)
Eosinophils Relative: 6 %
HCT: 36.2 % (ref 36.0–46.0)
Hemoglobin: 12.1 g/dL (ref 12.0–15.0)
Immature Granulocytes: 0 %
Lymphocytes Relative: 19 %
Lymphs Abs: 1.4 10*3/uL (ref 0.7–4.0)
MCH: 30.6 pg (ref 26.0–34.0)
MCHC: 33.4 g/dL (ref 30.0–36.0)
MCV: 91.4 fL (ref 80.0–100.0)
Monocytes Absolute: 0.9 10*3/uL (ref 0.1–1.0)
Monocytes Relative: 12 %
Neutro Abs: 4.7 10*3/uL (ref 1.7–7.7)
Neutrophils Relative %: 62 %
Platelets: 187 10*3/uL (ref 150–400)
RBC: 3.96 MIL/uL (ref 3.87–5.11)
RDW: 12.5 % (ref 11.5–15.5)
WBC: 7.6 10*3/uL (ref 4.0–10.5)
nRBC: 0 % (ref 0.0–0.2)

## 2024-04-17 LAB — COMPREHENSIVE METABOLIC PANEL WITH GFR
ALT: 12 U/L (ref 0–44)
AST: 18 U/L (ref 15–41)
Albumin: 3.4 g/dL — ABNORMAL LOW (ref 3.5–5.0)
Alkaline Phosphatase: 61 U/L (ref 38–126)
Anion gap: 10 (ref 5–15)
BUN: 23 mg/dL (ref 8–23)
CO2: 24 mmol/L (ref 22–32)
Calcium: 9.4 mg/dL (ref 8.9–10.3)
Chloride: 101 mmol/L (ref 98–111)
Creatinine, Ser: 1.45 mg/dL — ABNORMAL HIGH (ref 0.44–1.00)
GFR, Estimated: 35 mL/min — ABNORMAL LOW (ref >60)
Glucose, Bld: 237 mg/dL — ABNORMAL HIGH (ref 70–99)
Potassium: 4.1 mmol/L (ref 3.5–5.1)
Sodium: 135 mmol/L (ref 135–145)
Total Bilirubin: 0.7 mg/dL (ref 0.0–1.2)
Total Protein: 6.5 g/dL (ref 6.5–8.1)

## 2024-04-17 LAB — CBG MONITORING, ED: Glucose-Capillary: 230 mg/dL — ABNORMAL HIGH (ref 70–99)

## 2024-04-17 NOTE — ED Triage Notes (Signed)
 Patient arrives from BIB [facility] where patient complains of R eye blurred vision that started at 10 am today. States she went to bed normally last night despite staying awake later than normal. Patient reports her CBG being 400 this afternoon, treated herself with insulin , EMS noted a CBG of 257, started NS infusion through 20 g IV in the LAC. Patient bradycardic in the 50s-60s w/ EMS. Daughter reports unsteady gait for weeks. Hx of stroke in December.  Aox4.

## 2024-04-18 ENCOUNTER — Emergency Department (HOSPITAL_COMMUNITY)

## 2024-04-18 ENCOUNTER — Observation Stay (HOSPITAL_BASED_OUTPATIENT_CLINIC_OR_DEPARTMENT_OTHER)

## 2024-04-18 ENCOUNTER — Encounter (HOSPITAL_COMMUNITY): Payer: Self-pay | Admitting: Internal Medicine

## 2024-04-18 DIAGNOSIS — I5032 Chronic diastolic (congestive) heart failure: Secondary | ICD-10-CM

## 2024-04-18 DIAGNOSIS — Z794 Long term (current) use of insulin: Secondary | ICD-10-CM | POA: Diagnosis not present

## 2024-04-18 DIAGNOSIS — H538 Other visual disturbances: Secondary | ICD-10-CM | POA: Diagnosis not present

## 2024-04-18 DIAGNOSIS — R9082 White matter disease, unspecified: Secondary | ICD-10-CM | POA: Diagnosis not present

## 2024-04-18 DIAGNOSIS — E782 Mixed hyperlipidemia: Secondary | ICD-10-CM | POA: Diagnosis not present

## 2024-04-18 DIAGNOSIS — K219 Gastro-esophageal reflux disease without esophagitis: Secondary | ICD-10-CM | POA: Diagnosis not present

## 2024-04-18 DIAGNOSIS — I10 Essential (primary) hypertension: Secondary | ICD-10-CM

## 2024-04-18 DIAGNOSIS — I672 Cerebral atherosclerosis: Secondary | ICD-10-CM | POA: Diagnosis not present

## 2024-04-18 DIAGNOSIS — E1165 Type 2 diabetes mellitus with hyperglycemia: Secondary | ICD-10-CM | POA: Diagnosis not present

## 2024-04-18 DIAGNOSIS — R29818 Other symptoms and signs involving the nervous system: Secondary | ICD-10-CM | POA: Diagnosis not present

## 2024-04-18 DIAGNOSIS — N1832 Chronic kidney disease, stage 3b: Secondary | ICD-10-CM | POA: Diagnosis not present

## 2024-04-18 DIAGNOSIS — I6523 Occlusion and stenosis of bilateral carotid arteries: Secondary | ICD-10-CM | POA: Diagnosis not present

## 2024-04-18 DIAGNOSIS — I2510 Atherosclerotic heart disease of native coronary artery without angina pectoris: Secondary | ICD-10-CM | POA: Insufficient documentation

## 2024-04-18 DIAGNOSIS — E46 Unspecified protein-calorie malnutrition: Secondary | ICD-10-CM | POA: Diagnosis not present

## 2024-04-18 DIAGNOSIS — E8809 Other disorders of plasma-protein metabolism, not elsewhere classified: Secondary | ICD-10-CM

## 2024-04-18 LAB — RAPID URINE DRUG SCREEN, HOSP PERFORMED
Amphetamines: NOT DETECTED
Barbiturates: NOT DETECTED
Benzodiazepines: NOT DETECTED
Cocaine: NOT DETECTED
Opiates: NOT DETECTED
Tetrahydrocannabinol: NOT DETECTED

## 2024-04-18 LAB — GLUCOSE, CAPILLARY
Glucose-Capillary: 73 mg/dL (ref 70–99)
Glucose-Capillary: 101 mg/dL — ABNORMAL HIGH (ref 70–99)
Glucose-Capillary: 250 mg/dL — ABNORMAL HIGH (ref 70–99)
Glucose-Capillary: 222 mg/dL — ABNORMAL HIGH (ref 70–99)

## 2024-04-18 LAB — ECHOCARDIOGRAM COMPLETE
Area-P 1/2: 2.95 cm2
Height: 61 in
MV M vel: 4.12 m/s
MV Peak grad: 67.9 mmHg
P 1/2 time: 484 ms
S' Lateral: 2.8 cm
Weight: 1936.52 [oz_av]

## 2024-04-18 LAB — APTT: aPTT: 28 s (ref 24–36)

## 2024-04-18 LAB — PROTIME-INR
INR: 1.1 (ref 0.8–1.2)
Prothrombin Time: 14.6 s (ref 11.4–15.2)

## 2024-04-18 MED ORDER — ATORVASTATIN CALCIUM 20 MG PO TABS
20.0000 mg | ORAL_TABLET | Freq: Every day | ORAL | Status: DC
  Administered 2024-04-18 – 2024-04-19 (×2): 20 mg via ORAL
  Filled 2024-04-18 (×2): qty 1

## 2024-04-18 MED ORDER — STROKE: EARLY STAGES OF RECOVERY BOOK: Freq: Once | Status: AC

## 2024-04-18 MED ORDER — INSULIN ASPART 100 UNIT/ML IJ SOLN
0.0000 [IU] | INTRAMUSCULAR | Status: DC
  Administered 2024-04-18 – 2024-04-19 (×3): 3 [IU] via SUBCUTANEOUS

## 2024-04-18 MED ORDER — PANTOPRAZOLE SODIUM 40 MG PO TBEC
40.0000 mg | DELAYED_RELEASE_TABLET | Freq: Every day | ORAL | Status: DC
  Administered 2024-04-18 – 2024-04-19 (×2): 40 mg via ORAL
  Filled 2024-04-18 (×2): qty 1

## 2024-04-18 MED ORDER — IOHEXOL 350 MG/ML SOLN
60.0000 mL | Freq: Once | INTRAVENOUS | Status: AC | PRN
  Administered 2024-04-18: 60 mL via INTRAVENOUS

## 2024-04-18 MED ORDER — CLOPIDOGREL BISULFATE 75 MG PO TABS
75.0000 mg | ORAL_TABLET | Freq: Every day | ORAL | Status: DC
  Administered 2024-04-18 – 2024-04-19 (×2): 75 mg via ORAL
  Filled 2024-04-18 (×2): qty 1

## 2024-04-18 MED ORDER — GLUCERNA SHAKE PO LIQD
237.0000 mL | Freq: Three times a day (TID) | ORAL | Status: DC
  Administered 2024-04-18 – 2024-04-19 (×4): 237 mL via ORAL

## 2024-04-18 MED ORDER — ASPIRIN 81 MG PO CHEW
81.0000 mg | CHEWABLE_TABLET | Freq: Every day | ORAL | Status: DC
  Administered 2024-04-18 – 2024-04-19 (×2): 81 mg via ORAL
  Filled 2024-04-18 (×2): qty 1

## 2024-04-18 NOTE — H&P (Addendum)
 History and Physical    Patient: Kara Dean ZOX:[id number] DOB: August 07, 1933 DOA: 04/17/2024 DOS: the patient was seen and examined on 04/18/2024 PCP: Delfina Feller, FNP  Patient coming from: Home  Chief Complaint:  Chief Complaint  Patient presents with   Blurred Vision   HPI: Kara Dean is a 89 y.o. female with medical history significant of paroxysmal atrial fibrillation, prior stroke, hyperlipidemia, type 2 diabetes mellitus, GERD, diastolic CHF, CAD who presents to the emergency department due to blurred vision.  Most of the history was obtained from EDP and daughter at bedside.  Per report, last known well was Friday night (6/13) prior to bed, on waking up yesterday (Saturday -6/14), patient complaining of difficulty in being able to see from right eye and also presented with impaired balance and some dizziness compared to baseline, daughter was worried she may be having another stroke since presentation was similar to prior stroke.  ED Course:  In the emergency department, she was bradycardic with HR of 56 bpm, BP was 123/59, other vital signs were within normal range.  Workup in the ED showed normal CBC and BMP except for blood glucose of 237 and creatinine of 1.45 (creatinine is within baseline range).  Albumin 3.4, urinalysis was normal. CT head showed no acute intracranial abnormality CT angiography of the head and neck with and without contrast showed no significant intracranial stenosis or LVO. Telemetry neurologist was consulted and recommended further stroke workup.  TRH was asked admit patient for further evaluation and management.  Review of Systems: Review of systems as noted in the HPI. All other systems reviewed and are negative.   Past Medical History:  Diagnosis Date   Anxiety    Carotid artery disease (HCC)    Carotid US  11/2018:  bilateral ICA 1-39 >> repeat 2 years.    Cataract    Crohn's colitis (HCC)    CVA (cerebral infarction)    No deficits    Depression    Diabetes mellitus    type 2   Dyslipidemia    GERD (gastroesophageal reflux disease)    History of TIAs    no deficits   Hyperlipidemia    Hypertension    Hyponatremia    Late effect of cerebrovascular accident (CVA) 03/26/2010   Monitor 12/2023: no atrial fibrillation         Nodular basal cell carcinoma (BCC) 04/22/2018   left prox, nasal root (tx p bx)   Osteoarthritis    SCC (squamous cell carcinoma) 04/22/2018   left shin,superior-(tx p bx), Left shin,inferior-(tx p bx )   Past Surgical History:  Procedure Laterality Date   ABDOMINAL HYSTERECTOMY     BACK SURGERY  1994   CATARACT EXTRACTION W/PHACO Left 09/19/2017   Procedure: CATARACT EXTRACTION PHACO AND INTRAOCULAR LENS PLACEMENT (IOC);  Surgeon: Tarri Farm, MD;  Location: [facility];  Service: Ophthalmology;  Laterality: Left;  CDE: 8.10   CATARACT EXTRACTION W/PHACO Right 10/10/2017   Procedure: CATARACT EXTRACTION PHACO AND INTRAOCULAR LENS PLACEMENT RIGHT EYE;  Surgeon: Tarri Farm, MD;  Location: [facility];  Service: Ophthalmology;  Laterality: Right;  CDE: 9.60   CHOLECYSTECTOMY N/A 05/10/2021   Procedure: LAPAROSCOPIC CHOLECYSTECTOMY;  Surgeon: Adalberto Acton, MD;  Location: [facility];  Service: General;  Laterality: N/A;   COLON RESECTION  1990's   TUBAL LIGATION     VESICOVAGINAL FISTULA CLOSURE W/ TAH  1984    Social History:  reports that she has never smoked. She has been exposed to tobacco  smoke. She has never used smokeless tobacco. She reports that she does not drink alcohol and does not use drugs.   Allergies  Allergen Reactions   Codeine Other (See Comments)    unknown   Latex Rash   Morphine Other (See Comments)    unknown   Niacin Other (See Comments)    unknown   Sulfa Antibiotics Rash   Sulfonamide Derivatives Rash    Family History  Problem Relation Age of Onset   Coronary artery disease Sister        stent x 3   Seizures Sister    Coronary artery disease Brother         stent   Hypertension Mother    Stroke Mother    Emphysema Father    Alcohol abuse Brother    Lung disease Brother    Heart attack Daughter      Prior to Admission medications   Medication Sig Start Date End Date Taking? Authorizing Provider  amLODipine  (NORVASC ) 10 MG tablet Take 1 tablet (10 mg total) by mouth daily. 03/08/24   Delfina Feller, FNP  aspirin  81 MG chewable tablet Chew 1 tablet (81 mg total) by mouth daily. 10/20/23   Demaris Fillers, MD  atorvastatin  (LIPITOR) 20 MG tablet Take 1 tablet (20 mg total) by mouth daily. 03/08/24   Gaylyn Keas, Mary-Margaret, FNP  citalopram  (CELEXA ) 20 MG tablet Take 1 tablet (20 mg total) by mouth daily. 03/08/24   Delfina Feller, FNP  fluticasone  (FLONASE ) 50 MCG/ACT nasal spray USE 2 SPRAYS IN EACH NOSTRIL ONCE DAILY 10/22/23   Gaylyn Keas, Mary-Margaret, FNP  furosemide  (LASIX ) 20 MG tablet Take 20 mg by mouth daily. 02/12/24   [provider]  Insulin  Pen Needle (PIP PEN NEEDLES 32G X ) 32G X 4 MM MISC UAD 04/08/24   Gaylyn Keas, Mary-Margaret, FNP  LANTUS  SOLOSTAR 100 UNIT/ML Solostar Pen Inject 48 Units into the skin daily. 03/08/24   Gaylyn Keas, Mary-Margaret, FNP  LORazepam  (ATIVAN ) 0.5 MG tablet Take 1 tablet (0.5 mg total) by mouth 2 (two) times daily. 03/08/24   Gaylyn Keas Mary-Margaret, FNP  metoprolol  succinate (TOPROL -XL) 25 MG 24 hr tablet Take 1 tablet (25 mg total) by mouth daily. 03/08/24   Gaylyn Keas Mary-Margaret, FNP  olmesartan  (BENICAR ) 40 MG tablet Take 1 tablet (40 mg total) by mouth daily. 03/08/24   Delfina Feller, FNP  ONETOUCH ULTRA test strip CHECK BLOOD SUGER UP TO 3 TIMES A DAY 10/20/23   Gaylyn Keas, Mary-Margaret, FNP  pantoprazole  (PROTONIX ) 40 MG tablet Take 1 tablet (40 mg total) by mouth daily. 03/08/24   Gaylyn Keas, Mary-Margaret, FNP  potassium chloride  (KLOR-CON ) 10 MEQ tablet Take 1 tablet (10 mEq total) by mouth daily. 03/08/24   Gaylyn Keas, Mary-Margaret, FNP  tolterodine  (DETROL  LA) 4 MG 24 hr capsule Take 1 capsule (4 mg total) by  mouth daily. 03/08/24   Delfina Feller, FNP    Physical Exam: BP (!) 158/73 (BP Location: Right Arm)   Pulse (!) 55   Temp 98.4 F (36.9 C) (Oral)   Resp 20   Ht 5' 1 (1.549 m)   Wt 54.9 kg   SpO2 94%   BMI 22.87 kg/m   General: 89 y.o. year-old female well developed well nourished in no acute distress.  Alert and oriented x3. HEENT: NCAT, EOMI Neck: Supple, trachea medial Cardiovascular: Regular rate and rhythm with no rubs or gallops.  No thyromegaly or JVD noted.  No lower extremity edema. 2/4 pulses in all 4 extremities. Respiratory: Clear to auscultation with  no wheezes or rales. Good inspiratory effort. Abdomen: Soft, nontender nondistended with normal bowel sounds x4 quadrants. Muskuloskeletal: No cyanosis, clubbing or edema noted bilaterally Neuro: CN II-XII intact, strength 5/5 x 4, normal heel-to-shin movement.  Sensation, reflexes intact Skin: No ulcerative lesions noted or rashes Psychiatry: Mood is appropriate for condition and setting          Labs on Admission:  Basic Metabolic Panel: Recent Labs  Lab 04/17/24 2141  NA 135  K 4.1  CL 101  CO2 24  GLUCOSE 237*  BUN 23  CREATININE 1.45*  CALCIUM  9.4   Liver Function Tests: Recent Labs  Lab 04/17/24 2141  AST 18  ALT 12  ALKPHOS 61  BILITOT 0.7  PROT 6.5  ALBUMIN 3.4*   No results for input(s): LIPASE, AMYLASE in the last 168 hours. No results for input(s): AMMONIA in the last 168 hours. CBC: Recent Labs  Lab 04/17/24 2141  WBC 7.6  NEUTROABS 4.7  HGB 12.1  HCT 36.2  MCV 91.4  PLT 187   Cardiac Enzymes: No results for input(s): CKTOTAL, CKMB, CKMBINDEX, TROPONINI in the last 168 hours.  BNP (last 3 results) No results for input(s): BNP in the last 8760 hours.  ProBNP (last 3 results) No results for input(s): PROBNP in the last 8760 hours.  CBG: Recent Labs  Lab 04/17/24 2102  GLUCAP 230*    Radiological Exams on Admission: CT ANGIO HEAD NECK W WO  CM Result Date: 04/18/2024 EXAM: CT HEAD WITHOUT CTA HEAD AND NECK WITH AND WITHOUT 04/18/2024 12:45:52 AM TECHNIQUE: CTA of the head and neck was performed with and without the administration of intravenous contrast. Noncontrast CT of the head with reconstructed 2-D images are also provided for review. Multiplanar 2D and/or 3D reformatted images are provided for review. Automated exposure control, iterative reconstruction, and/or weight based adjustment of the mA/kV was utilized to reduce the radiation dose to as low as reasonably achievable. COMPARISON: CTA head and neck 10/19/2023 and MR head without contrast 10/18/2023. CLINICAL HISTORY: Neuro deficit, acute, stroke suspected. Patient arrives from [facility]IB [facility] where patient complains of R eye blurred vision that started at 10 am today. States she went to bed normally last night despite staying awake later than normal. Patient reports her CBG being 400 this afternoon, treated herself with insulin , EMS noted a CBG of 257, started NS infusion through 20 g IV in the LAC. Patient bradycardic in the 50s-60s w/ EMS. Daughter reports unsteady gait for weeks. Hx of stroke in December. Aox4. FINDINGS: CT HEAD: BRAIN AND VENTRICLES: Mild generalized atrophy and moderate diffuse white matter disease are stable. No acute intracranial hemorrhage. No mass effect or midline shift. No extra-axial fluid collection. Gray-white differentiation is maintained. No hydrocephalus. ORBITS: No acute abnormality. SINUSES: No acute abnormality. SOFT TISSUES AND SKULL: No acute abnormality. CTA NECK: AORTIC ARCH AND ARCH VESSELS: A 4-vessel arch configuration is again seen. Atherosclerotic changes are present in the distal arch and at the origin of the left subclavian artery without significant stenosis. No dissection or arterial injury. CERVICAL CAROTID ARTERIES: Atherosclerotic changes are present at the right carotid bifurcation without significant stenosis. Atherosclerotic calcifications  are present in the distal left common carotid artery no focal stenosis is present. No dissection or arterial injury. CERVICAL VERTEBRAL ARTERIES: Focal atherosclerotic calcification is present at the origin of the dominant right vertebral artery without significant stenosis. No dissection or arterial injury. VISUALIZED LUNGS AND MEDIASTINUM: Unremarkable. SOFT TISSUES: No acute abnormality. BONES: No acute abnormality.  CTA HEAD: ANTERIOR CIRCULATION: Atherosclerotic calcifications are present within the cavernous internal carotid arteries bilaterally. No significant stenosis no change is present. The MCA bifurcations are normal. The ACA and MCA branch vessels are within normal limits. No aneurysm. POSTERIOR CIRCULATION: Atherosclerotic calcifications are present in the dural margin of both vertebral arteries without significant stenosis. No significant stenosis of the posterior cerebral arteries. No significant stenosis of the basilar artery. No significant stenosis of the vertebral arteries. No aneurysm. OTHER: No dural venous sinus thrombosis on this non-dedicated study. IMPRESSION: 1. No acute intracranial abnormality. 2. Stable atrophy and white matter disease. 3. Atherosclerotic changes at the aortic arch, carotid bifurcations, cavernous internal carotid arteries and epidural margin of both vertebral arteries without significant stenosis. 4. No significant intracranial stenosis or large vessel occlusion. Electronically signed by: Audree Leas MD 04/18/2024 05:28 AM EDT RP Workstation: [id number]    EKG: I independently viewed the EKG done and my findings are as followed: Sinus bradycardia at rate of 57 bpm with ventricular bigeminy  Assessment/Plan Present on Admission:  Chronic kidney disease, stage 3b (HCC)  Essential hypertension  Mixed hyperlipidemia  Chronic diastolic CHF (congestive heart failure) (HCC)  Gastroesophageal reflux disease without esophagitis  Principal Problem:   Blurred  vision Active Problems:   Mixed hyperlipidemia   Essential hypertension   Gastroesophageal reflux disease without esophagitis   Chronic kidney disease, stage 3b (HCC)   Chronic diastolic CHF (congestive heart failure) (HCC)   Type 2 diabetes mellitus with hyperglycemia (HCC)   Hypoalbuminemia due to protein-calorie malnutrition (HCC)   CAD (coronary artery disease)  Blurred vision, rule out acute ischemic stroke Patient will be admitted to telemetry unit  CT head showed no acute intracranial abnormality CT angiography of the head and neck with and without contrast showed no significant intracranial stenosis or LVO Echocardiogram in the morning MRI of brain without contrast in the morning Continue aspirin  and statin Continue fall precautions and neuro checks Lipid panel and hemoglobin A1c will be checked Continue PT/SLP/OT eval and treat Bedside swallow eval by nursing prior to diet Consider tele neurology consult with on-call teleneurologist after complete workup  Hypoalbuminemia possibly due to mild protein calorie malnutrition Albumin 3.4, protein supplement will be provided  CKD 3B creatinine of 1.45 (creatinine is within baseline range) Renally adjust medications, avoid nephrotoxic agents/dehydration/hypotension  Type 2 diabetes mellitus with hyperglycemia CBG 237 Hemoglobin A1c on 03/08/2024 was 9.1 Continue ISS and hypoglycemia protocol  Essential hypertension  Antihypertensives PRN if Blood pressure is greater than 220/120 or there is a concern for End organ damage/contraindications for permissive HTN. If blood pressure is greater than 220/120 give labetalol PO or IV or Vasotec IV with a goal of 15% reduction in BP during the first 24 hours.  Reported history of arrhythmia Patient has a loop recorder Teleneurologist recommended asking cardiologist prior to MRI to ensure that MRI will not erase current loop recorder readings.  Mixed hyperlipidemia Continue  statin  Diastolic CHF Echocardiogram done on 10/19/2023 showed LVEF of 60 to 65%, G1 DD Patient was euvolemic at bedside Continue aspirin , Lipitor. Lasix , Toprol , olmesartan  held at this time due to permissive hypertension  CAD Continue aspirin , Lipitor  GERD Continue Protonix   DVT prophylaxis: SCDs  Code Status: Full code  Family Communication: Daughter at bedside (all questions answered to satisfaction)  Consults: None  Severity of Illness: The appropriate patient status for this patient is OBSERVATION. Observation status is judged to be reasonable and necessary in order to provide the required intensity  of service to ensure the patient's safety. The patient's presenting symptoms, physical exam findings, and initial radiographic and laboratory data in the context of their medical condition is felt to place them at decreased risk for further clinical deterioration. Furthermore, it is anticipated that the patient will be medically stable for discharge from the hospital within 2 midnights of admission.   Author: Willye Javier, DO 04/18/2024 7:42 AM  For on call review [email or web address].

## 2024-04-18 NOTE — ED Provider Notes (Signed)
 [facility] Emergency Department Provider Note MRN:  [id number]  Arrival date & time: 04/18/24     Chief Complaint   Blurred Vision   History of Present Illness   Kara Dean is a 89 y.o. year-old female with a history of carotid artery disease, Crohn's disease, stroke presenting to the ED with chief complaint of blurry vision.  Woke up with inability to see out of the right eye, went to bed with normal vision.  Less steady on her feet today as well, feeling dizzy, impaired balance today compared to her baseline.  Family worried about another stroke.  Review of Systems  A thorough review of systems was obtained and all systems are negative except as noted in the HPI and PMH.   Patient's Health History    Past Medical History:  Diagnosis Date   Anxiety    Carotid artery disease (HCC)    Carotid US  11/2018:  bilateral ICA 1-39 >> repeat 2 years.    Cataract    Crohn's colitis (HCC)    CVA (cerebral infarction)    No deficits   Depression    Diabetes mellitus    type 2   Dyslipidemia    GERD (gastroesophageal reflux disease)    History of TIAs    no deficits   Hyperlipidemia    Hypertension    Hyponatremia    Late effect of cerebrovascular accident (CVA) 03/26/2010   Monitor 12/2023: no atrial fibrillation         Nodular basal cell carcinoma (BCC) 04/22/2018   left prox, nasal root (tx p bx)   Osteoarthritis    SCC (squamous cell carcinoma) 04/22/2018   left shin,superior-(tx p bx), Left shin,inferior-(tx p bx )    Past Surgical History:  Procedure Laterality Date   ABDOMINAL HYSTERECTOMY     BACK SURGERY  1994   CATARACT EXTRACTION W/PHACO Left 09/19/2017   Procedure: CATARACT EXTRACTION PHACO AND INTRAOCULAR LENS PLACEMENT (IOC);  Surgeon: Tarri Farm, MD;  Location: [facility];  Service: Ophthalmology;  Laterality: Left;  CDE: 8.10   CATARACT EXTRACTION W/PHACO Right 10/10/2017   Procedure: CATARACT EXTRACTION PHACO AND INTRAOCULAR LENS  PLACEMENT RIGHT EYE;  Surgeon: Tarri Farm, MD;  Location: [facility];  Service: Ophthalmology;  Laterality: Right;  CDE: 9.60   CHOLECYSTECTOMY N/A 05/10/2021   Procedure: LAPAROSCOPIC CHOLECYSTECTOMY;  Surgeon: Adalberto Acton, MD;  Location: [facility];  Service: General;  Laterality: N/A;   COLON RESECTION  1990's   TUBAL LIGATION     VESICOVAGINAL FISTULA CLOSURE W/ TAH  1984    Family History  Problem Relation Age of Onset   Coronary artery disease Sister        stent x 3   Seizures Sister    Coronary artery disease Brother        stent   Hypertension Mother    Stroke Mother    Emphysema Father    Alcohol abuse Brother    Lung disease Brother    Heart attack Daughter     Social History   Socioeconomic History   Marital status: Widowed    Spouse name: Not on file   Number of children: 6   Years of education: 10   Highest education level: 10th grade  Occupational History   Occupation: Retired    Comment: [employer]  Tobacco Use   Smoking status: Never    Passive exposure: Current   Smokeless tobacco: Never  Vaping Use   Vaping status: Never Used  Substance and Sexual Activity   Alcohol use: No   Drug use: No   Sexual activity: Not Currently  Other Topics Concern   Not on file  Social History Narrative   Mrs Lavin is retired and widowed. She lives at home and her granddaughter lives with her. She lives within walking distance to her 2 daughter's houses. Has an indoor cat. Her sister moved to [place]  and she doesn't get to get to visit with her often but does talk with her on the phone. She has 6 children and many grandchildren and great grandchildren.    Social Drivers of Corporate investment banker Strain: Low Risk  (03/15/2024)   Overall Financial Resource Strain (CARDIA)    Difficulty of Paying Living Expenses: Not hard at all  Food Insecurity: No Food Insecurity (03/15/2024)   Hunger Vital Sign    Worried About Running Out of Food in the Last Year: Never true    Ran  Out of Food in the Last Year: Never true  Transportation Needs: No Transportation Needs (03/15/2024)   PRAPARE - Administrator, Civil Service (Medical): No    Lack of Transportation (Non-Medical): No  Physical Activity: Insufficiently Active (03/15/2024)   Exercise Vital Sign    Days of Exercise per Week: 3 days    Minutes of Exercise per Session: 30 min  Stress: No Stress Concern Present (03/15/2024)   [employer] of Occupational Health - Occupational Stress Questionnaire    Feeling of Stress : Not at all  Social Connections: Socially Isolated (03/15/2024)   Social Connection and Isolation Panel    Frequency of Communication with Friends and Family: More than three times a week    Frequency of Social Gatherings with Friends and Family: More than three times a week    Attends Religious Services: Never    Database administrator or Organizations: No    Attends Banker Meetings: Never    Marital Status: Widowed  Intimate Partner Violence: Not At Risk (03/15/2024)   Humiliation, Afraid, Rape, and Kick questionnaire    Fear of Current or Ex-Partner: No    Emotionally Abused: No    Physically Abused: No    Sexually Abused: No     Physical Exam   Vitals:   04/17/24 2315 04/18/24 0015  BP:  (!) 152/104  Pulse: 63 (!) 54  Resp: 18 18  Temp:    SpO2: 97% 98%    CONSTITUTIONAL: Chronically ill-appearing, NAD NEURO/PSYCH:  Alert and oriented x 3, normal and symmetric strength and sensation, normal coordination, normal speech EYES:  eyes equal and reactive ENT/NECK:  no LAD, no JVD CARDIO: Regular rate, well-perfused, normal S1 and S2 PULM:  CTAB no wheezing or rhonchi GI/GU:  non-distended, non-tender MSK/SPINE:  No gross deformities, no edema SKIN:  no rash, atraumatic   *Additional and/or pertinent findings included in MDM below  Diagnostic and Interventional Summary    EKG Interpretation Date/Time:  Saturday April 17 2024 21:04:36 EDT Ventricular  Rate:  57 PR Interval:  198 QRS Duration:  110 QT Interval:  468 QTC Calculation: 456 R Axis:   -54  Text Interpretation: Sinus rhythm Ventricular bigeminy LVH with IVCD, LAD and secondary repol abnrm Confirmed by Gwenetta Lennert [id number]) on 04/18/2024 1:39:50 AM       Labs Reviewed  COMPREHENSIVE METABOLIC PANEL WITH GFR - Abnormal; Notable for the following components:      Result Value   Glucose, Bld 237 (*)  Creatinine, Ser 1.45 (*)    Albumin 3.4 (*)    GFR, Estimated 35 (*)    All other components within normal limits  CBG MONITORING, ED - Abnormal; Notable for the following components:   Glucose-Capillary 230 (*)    All other components within normal limits  CBC WITH DIFFERENTIAL/PLATELET  PROTIME-INR  APTT  RAPID URINE DRUG SCREEN, HOSP PERFORMED  I-STAT CHEM 8, ED    CT ANGIO HEAD NECK W WO CM    (Results Pending)    Medications  iohexol  (OMNIPAQUE ) 350 MG/ML injection 60 mL (60 mLs Intravenous Contrast Given 04/18/24 0037)     Procedures  /  Critical Care .Critical Care  Performed by: Edson Graces, MD Authorized by: Edson Graces, MD   Critical care provider statement:    Critical care time (minutes):  30   Critical care was necessary to treat or prevent imminent or life-threatening deterioration of the following conditions:  CNS failure or compromise   Critical care was time spent personally by me on the following activities:  Development of treatment plan with patient or surrogate, discussions with consultants, evaluation of patient's response to treatment, examination of patient, ordering and review of laboratory studies, ordering and review of radiographic studies, ordering and performing treatments and interventions, pulse oximetry, re-evaluation of patient's condition and review of old charts   ED Course and Medical Decision Making  Initial Impression and Ddx Question stroke or TIA.  Unclear when the visual disturbance resolved or improved but  currently she can see out of her right eye, slightly decreased visual acuity compared to the left.  She has no other obvious focal deficits but complaining of dizziness, balance issues, question posterior circulation stroke.  She says she is unable to get MRIs at this time because of a loop recorder.  Consulting neurology, obtaining CTA head and neck.  Past medical/surgical history that increases complexity of ED encounter: History of stroke  Interpretation of Diagnostics I personally reviewed the EKG and my interpretation is as follows: Sinus rhythm  No significant blood count or electrolyte disturbance.  Patient Reassessment and Ultimate Disposition/Management     Case discussed with teleneurology, recommending admission for stroke workup, MRI.  Will need to coordinate with cardiology because the MRI machine can erase loop recorder data.  Patient management required discussion with the following services or consulting groups:  Hospitalist Service  Complexity of Problems Addressed Acute illness or injury that poses threat of life of bodily function  Additional Data Reviewed and Analyzed Further history obtained from: Further history from spouse/family member  Additional Factors Impacting ED Encounter Risk Consideration of hospitalization  Merrick Abe. Harless Lien, MD [facility] Emergency Medicine [facility] [email or web address]  Final Clinical Impressions(s) / ED Diagnoses     ICD-10-CM   1. Dizzy  R42     2. Visual disturbance  H53.9     3. Balance problem  R26.89       ED Discharge Orders     None        Discharge Instructions Discussed with and Provided to Patient:   Discharge Instructions   None      Edson Graces, MD 04/18/24 0140

## 2024-04-18 NOTE — Care Management Obs Status (Signed)
 MEDICARE OBSERVATION STATUS NOTIFICATION   Patient Details  Name: Kara Dean MRN: [id number] Date of Birth: [birth year]   Medicare Observation Status Notification Given:  Yes    Grandville Lax, LCSWA 04/18/2024, 12:21 PM

## 2024-04-18 NOTE — Progress Notes (Signed)
 Kara Dean is a 89 y.o. female with medical history significant of paroxysmal atrial fibrillation, prior stroke, lipidemia, type 2 diabetes mellitus, GERD, diastolic CHF, CAD who presents to the emergency department due to blurred vision.  Most of the history was obtained from EDP and daughter at bedside.  Per report, last known well was Friday night (6/13) prior to bed, on waking up yesterday (Saturday -6/14), patient complaining of difficulty in being able to see from right eye and also presented with impaired balance and some dizziness compared to baseline, daughter was worried she may be having another stroke since presentation was similar to prior stroke.   Patient seen by teleneurology in ED with recommendations to follow-up CTA head and neck and follow-up PT/OT/SLP as well as obtain MRI brain.  Patient will need information of loop recorder prior to MRI and therefore MRI will be performed in a.m. after cardiology evaluation.  CTA head and neck with no LVO.  Obtain lipid panel, A1c, and TTE.  Maintain on aspirin  and Plavix  daily as well as atorvastatin  40 mg p.o. nightly.  Recommend follow-up with neurology as well as ophthalmology.  Patient has been admitted after midnight and seen and evaluated at bedside.  Daughter updated at bedside.  It appears that her vision has improved markedly.  Total care time: 35 minutes.

## 2024-04-18 NOTE — Progress Notes (Signed)
  Echocardiogram 2D Echocardiogram has been performed.  Farley Honer, RDCS 04/18/2024, 10:19 AM

## 2024-04-18 NOTE — Consult Note (Addendum)
 ADDENDUM: Advanced Imaging:  CTA Head and Neck Completed.  LVO:No (per direct discussion with Dr. Tresea Frost of radiology over the phone and also no LVO on my own personal read)  ---------------------------------------------------------------------------------------------------------   [facility] Tele[facility]rology Consult Services  Stat Consult  Patient Name:   Kara Dean, Kara Dean Date of Birth:   Jun 21, 1933 Identification Number:   MRN - [id number] Date of Service:   04/18/2024 00:08:26  Diagnosis:       H53.8 - Blurred Vision       R53.1 - Weakness  Impression 89 yo F who presents with vision changes. Head CT personally reviewed and negative for hemorrhage or acute area of developing infarct on my direct view. Outside window for thrombolytics. Technically LKN >24 hrs and NIHSS of 0 currently (albeit with subjective symptoms of significant blurry vision in right eye and worsened left sided weakness), would not be a candidate for thrombectomy. Would worry patient has had new stroke, ultimately to completely rule out stroke (that includes CRAO/BRAO of right eye), would need MRI and ophthalmology evaluation. Would have patient see ophthalmology as soon as possible and also obtain full stroke workup. Full recommendations as follows:   Recommendations: --f/u CTA head/neck -frequent neuro checks/vitals during admission -Goal blood pressure less than 220 systolic -Euglycemia and Avoid Hyperthermia (PRN Acetaminophen ) -NPO with maintenance IVFs (including no po medications) until bedside swallow screening performed (speech/swallow therapy if pt fails and also keep pt NPO) -obtain MRI brain w/o contrast (routine)   --can touch base with cardiology on seeing about getitn information off loop recorder prior to MRI -obtain Lipid panel, Hb A1c, TTE -Place on telemetry -Bolus with Clopidogrel  300 mg bolus x1 and initiate dual antiplatelet therapy with Aspirin  81 mg daily and  Clopidogrel  75 mg daily -Start atorvastatin  40 mg po qhs -dvt ppx per primary team -obtain UA, CBC, CMP if not already done -would have patient see ophthalmology as soon as possible (on outpatient basis if no in-house ophthalmology available) -PT/OT/ST consults - inpatient rehab if recommended -Please notify neurology immediately for change in exam  Dispositions : Neurology will follow    ----------------------------------------------------------------------------------------------------   Advanced Imaging: Advanced imaging has been ordered. Results pending.    Metrics: Dispatch Time: 04/18/2024 00:05:49 Callback Response Time: 04/18/2024 00:08:39  Primary Provider Notified of Diagnostic Impression and Management Plan on: 04/18/2024 00:50:17   CT HEAD: I personally reviewed all the CT images that were available to me and it showed: Head CT personally reviewed and negative for hemorrhage or acute area of developing infarct on my direct view.    ----------------------------------------------------------------------------------------------------  Chief Complaint: vision changes  History of Present Illness: Patient is a 89 year old Female. 89 yo F who presents with vision changes.  Patient and patient's daughter give history. Woke up not being able to see out of right eye. Having trouble lifting her leg and dragging her left leg with walking. Was also disoriented. Has not been able to walk well without walker since her stroke in December. Was last normal Friday night at 10 pm when she went bed.  Patient states currently her vision in still very blurry in right eye and she can barely see out of it. States she woke up with it like this since she woke up this morning. Was normal going to bed night previous at 10 pm. Feeling weak on left side, worse than normal weakness from prior stroke. Also has complained of numbness on left side of lip. Has loop recorder from prior stroke.  Past Medical History:      Hypertension      Diabetes Mellitus      Hyperlipidemia      Stroke      There is no history of Atrial Fibrillation Other PMH:  Crohn's disease, carotid artery disease per chart review  Medications:  No Anticoagulant use  Antiplatelet use: Yes asa 81 mg qdaily Reviewed EMR for current medications  Allergies:  Reviewed  Social History: Smoking: No  Family History:  There is no family history of premature cerebrovascular disease pertinent to this consultation  ROS : 14 Points Review of Systems was performed and was negative except mentioned in HPI.      Examination: BP(148/63), Pulse(63), 1A: Level of Consciousness - Alert; keenly responsive + 0 1B: Ask Month and Age - Both Questions Right + 0 1C: Blink Eyes & Squeeze Hands - Performs Both Tasks + 0 2: Test Horizontal Extraocular Movements - Normal + 0 3: Test Visual Fields - No Visual Loss + 0 4: Test Facial Palsy (Use Grimace if Obtunded) - Normal symmetry + 0 5A: Test Left Arm Motor Drift - No Drift for 10 Seconds + 0 5B: Test Right Arm Motor Drift - No Drift for 10 Seconds + 0 6A: Test Left Leg Motor Drift - No Drift for 5 Seconds + 0 6B: Test Right Leg Motor Drift - No Drift for 5 Seconds + 0 7: Test Limb Ataxia (FNF/Heel-Shin) - No Ataxia + 0 8: Test Sensation - Normal; No sensory loss + 0 9: Test Language/Aphasia - Normal; No aphasia + 0 10: Test Dysarthria - Normal + 0 11: Test Extinction/Inattention - No abnormality + 0  NIHSS Score: 0 NIHSS Free Text : reports significant blurry vision with right eye but can see finger movements in all four quadrants with either eye covered individually  Spoke with : ED provider Dr. Harless Lien    This consult was conducted in real time using interactive audio and Immunologist. Patient was informed of the technology being used for this visit and agreed to proceed. Patient located in hospital and provider located at home/office  setting.  Patient is being evaluated for possible acute neurologic impairment and high probability of imminent or life - threatening deterioration.I spent total of 45 minutes providing care to this patient, including time for face to face visit via telemedicine, review of medical records, imaging studies and discussion of findings with providers, the patient and / or family.   Dr Allyson Jack   [facility] For Inpatient follow-up with [facility] physician please call [facility] at [phone]. As we are not an outpatient service for any post hospital discharge needs please contact the hospital for assistance.  If you have any questions for the [facility] physicians or need to reconsult for clinical or diagnostic changes please contact us  via [facility] at [phone].

## 2024-04-18 NOTE — TOC CM/SW Note (Signed)
 Transition of Care [facility]) - Inpatient Brief Assessment   Patient Details  Name: Kara Dean MRN: [id number] Date of Birth: 1933/02/14  [facility] CM/SW Contact:    Grandville Lax, LCSWA Phone Number: 04/18/2024, 1:05 PM   Clinical Narrative: Transition of Care Department [facility]) has reviewed patient and no TOC needs have been identified at this time. We will continue to monitor patient advancement through interdiciplinary progression rounds. If new patient transition needs arise, please place a TOC consult.  Transition of Care Asessment: Insurance and Status: Insurance coverage has been reviewed Patient has primary care physician: Yes Home environment has been reviewed: From home Prior level of function:: Independent Prior/Current Home Services: No current home services Social Drivers of Health Review: SDOH reviewed no interventions necessary Readmission risk has been reviewed: Yes Transition of care needs: no transition of care needs at this time

## 2024-04-18 NOTE — Plan of Care (Signed)

## 2024-04-18 NOTE — Progress Notes (Signed)
 Speech Language Pathology Evaluation Patient Details Name: Kara Dean MRN: [id number] DOB: September 03, 1933 Today's Date: 04/18/2024 Time: 9147-8295 SLP Time Calculation (min) (ACUTE ONLY): 30 min  Problem List:  Patient Active Problem List   Diagnosis Date Noted   Type 2 diabetes mellitus with hyperglycemia (HCC) 04/18/2024   Hypoalbuminemia due to protein-calorie malnutrition (HCC) 04/18/2024   CAD (coronary artery disease) 04/18/2024   Blurred vision 04/18/2024   Valvular heart disease 11/18/2023   Chronic diastolic CHF (congestive heart failure) (HCC) 11/18/2023   Acute ischemic stroke (HCC) 10/19/2023   Right pontine stroke (HCC) 10/18/2023   Choledocholithiasis with acute cholecystitis 05/07/2021   Peripheral edema 03/18/2017   Chronic kidney disease, stage 3b (HCC) 07/27/2014   Gastroesophageal reflux disease without esophagitis 04/27/2014   GAD (generalized anxiety disorder) 04/27/2014   History of CVA (cerebrovascular accident) 03/26/2010   Mixed hyperlipidemia 02/01/2009   Depression 02/01/2009   Essential hypertension 02/01/2009   Osteoarthritis 02/01/2009   Diabetes mellitus with insulin  therapy (HCC) 02/01/2009   Past Medical History:  Past Medical History:  Diagnosis Date   Anxiety    Carotid artery disease (HCC)    Carotid US  11/2018:  bilateral ICA 1-39 >> repeat 2 years.    Cataract    Crohn's colitis (HCC)    CVA (cerebral infarction)    No deficits   Depression    Diabetes mellitus    type 2   Dyslipidemia    GERD (gastroesophageal reflux disease)    History of TIAs    no deficits   Hyperlipidemia    Hypertension    Hyponatremia    Late effect of cerebrovascular accident (CVA) 03/26/2010   Monitor 12/2023: no atrial fibrillation         Nodular basal cell carcinoma (BCC) 04/22/2018   left prox, nasal root (tx p bx)   Osteoarthritis    SCC (squamous cell carcinoma) 04/22/2018   left shin,superior-(tx p bx), Left shin,inferior-(tx p bx )   Past  Surgical History:  Past Surgical History:  Procedure Laterality Date   ABDOMINAL HYSTERECTOMY     BACK SURGERY  1994   CATARACT EXTRACTION W/PHACO Left 09/19/2017   Procedure: CATARACT EXTRACTION PHACO AND INTRAOCULAR LENS PLACEMENT (IOC);  Surgeon: Tarri Farm, MD;  Location: [facility];  Service: Ophthalmology;  Laterality: Left;  CDE: 8.10   CATARACT EXTRACTION W/PHACO Right 10/10/2017   Procedure: CATARACT EXTRACTION PHACO AND INTRAOCULAR LENS PLACEMENT RIGHT EYE;  Surgeon: Tarri Farm, MD;  Location: [facility];  Service: Ophthalmology;  Laterality: Right;  CDE: 9.60   CHOLECYSTECTOMY N/A 05/10/2021   Procedure: LAPAROSCOPIC CHOLECYSTECTOMY;  Surgeon: Adalberto Acton, MD;  Location: [facility];  Service: General;  Laterality: N/A;   COLON RESECTION  1990's   TUBAL LIGATION     VESICOVAGINAL FISTULA CLOSURE W/ TAH  1984   HPI:  Kara Dean is a 89 y.o. female with medical history significant of paroxysmal atrial fibrillation, prior stroke, lipidemia, type 2 diabetes mellitus, GERD, diastolic CHF, CAD who presents to the emergency department due to blurred vision.  Most of the history was obtained from EDP and daughter at bedside.  Per report, last known well was Friday night (6/13) prior to bed, on waking up yesterday (Saturday -6/14), patient complaining of difficulty in being able to see from right eye and also presented with impaired balance and some dizziness compared to baseline, daughter was worried she may be having another stroke since presentation was similar to prior stroke. CT head negative for acute  abnormality. MRI ordered, not yet completed.   Subjective:  Pt seen at bedside with daughter present. Pt denies changes regarding speech, language, cognition. Daughter reports concern for worsening memory, challenges with spoken expression. Pt is pleasant and engaged throughout evaluation.   Assessment / Plan / Recommendation Clinical Impression  Pt presents with cognitive linguistic  impairments in areas of verbal expression, memory and executive function. SLUMS score of 15/30 (below normal limits) with some benefit from SLP repetition and cueing to repair errors. Pt is receiving full time care from daughter with A for medication, schedule, bill, home management tasks. She endorses challenges with functional tasks of participating in conversations and using TV remote. Pt participates well in conversation, demonstrating intact auditory comprehension and spoken expression for simple conversation. There is reports of challenges with expressive communication in home environment, this was not evident today. Speech is [facility] with intact articulation, phonation, respiration. Voice is clear.  Pt is 100% intelligible. Pt's current cognitive status appears functional for acute environment, but she would benefit from [facility] f/u to address functional implications and enhance participation in desired avocational activities. Daughter and pt endorse desire for these services. Recommendations provided to have daughter present for complex conversations, asking for information in writing, and repeating back what was heard to ensure understanding and to aid in recall. ST to sign off at this time, recommend f/u next venue for further evaluation and treatment.    SLP Assessment  SLP Recommendation/Assessment: All further Speech Language Pathology needs can be addressed in the next venue of care SLP Visit Diagnosis: Cognitive communication deficit (R41.841)     Assistance Recommended at Discharge  Frequent or constant Supervision/Assistance     SLP Evaluation Cognition  Overall Cognitive Status: History of cognitive impairments - at baseline Arousal/Alertness: Awake/alert Orientation Level: Oriented X4 Attention:  (Appears intact) Memory: Appears intact Awareness: Impaired Awareness Impairment: Intellectual impairment;Anticipatory impairment Problem Solving: Appears intact Executive Function:  Organizing;Sequencing;Self Monitoring Sequencing: Impaired Sequencing Impairment: Verbal basic Organizing: Impaired Organizing Impairment: Functional basic Self Monitoring: Impaired Self Monitoring Impairment: Functional basic Behaviors:  (none) Safety/Judgment: Appears intact Comments:  (SLUMS completed, pt scores 15/30 (dementia range). Pt missed points for mental math, delayed recall, digit reversal, clock drawing, and story comprehension questions. Pt's errors are suggestive of impairments in memory and executive function.)       Comprehension  Auditory Comprehension Overall Auditory Comprehension: Appears within functional limits for tasks assessed Visual Recognition/Discrimination Discrimination: Within Function Limits    Expression Verbal Expression Overall Verbal Expression: Appears within functional limits for tasks assessed Other Verbal Expression Comments: Per daughter, pt appears to have difficulty expressing self in home enviornment. No overt instances of anomia, dysnomia, or decreased content/cohesion in discourse today.   Oral / Motor  Oral Motor/Sensory Function Overall Oral Motor/Sensory Function: Within functional limits Motor Speech Overall Motor Speech: Appears within functional limits for tasks assessed            Alston Jerry 04/18/2024, 10:02 AM

## 2024-04-18 NOTE — ED Notes (Signed)
 Patient transported to CT

## 2024-04-19 ENCOUNTER — Ambulatory Visit

## 2024-04-19 ENCOUNTER — Observation Stay (HOSPITAL_COMMUNITY)

## 2024-04-19 DIAGNOSIS — I639 Cerebral infarction, unspecified: Secondary | ICD-10-CM

## 2024-04-19 DIAGNOSIS — H538 Other visual disturbances: Secondary | ICD-10-CM | POA: Diagnosis not present

## 2024-04-19 DIAGNOSIS — H34239 Retinal artery branch occlusion, unspecified eye: Secondary | ICD-10-CM | POA: Diagnosis not present

## 2024-04-19 DIAGNOSIS — E1165 Type 2 diabetes mellitus with hyperglycemia: Secondary | ICD-10-CM | POA: Diagnosis not present

## 2024-04-19 DIAGNOSIS — R9082 White matter disease, unspecified: Secondary | ICD-10-CM | POA: Diagnosis not present

## 2024-04-19 DIAGNOSIS — R29818 Other symptoms and signs involving the nervous system: Secondary | ICD-10-CM | POA: Diagnosis not present

## 2024-04-19 LAB — BASIC METABOLIC PANEL WITH GFR
Anion gap: 10 (ref 5–15)
BUN: 21 mg/dL (ref 8–23)
CO2: 24 mmol/L (ref 22–32)
Calcium: 9.7 mg/dL (ref 8.9–10.3)
Chloride: 107 mmol/L (ref 98–111)
Creatinine, Ser: 1.25 mg/dL — ABNORMAL HIGH (ref 0.44–1.00)
GFR, Estimated: 41 mL/min — ABNORMAL LOW (ref >60)
Glucose, Bld: 70 mg/dL (ref 70–99)
Potassium: 3.8 mmol/L (ref 3.5–5.1)
Sodium: 141 mmol/L (ref 135–145)

## 2024-04-19 LAB — GLUCOSE, CAPILLARY
Glucose-Capillary: 137 mg/dL — ABNORMAL HIGH (ref 70–99)
Glucose-Capillary: 210 mg/dL — ABNORMAL HIGH (ref 70–99)
Glucose-Capillary: 77 mg/dL (ref 70–99)
Glucose-Capillary: 77 mg/dL (ref 70–99)

## 2024-04-19 LAB — LIPID PANEL
Cholesterol: 98 mg/dL (ref 0–200)
HDL: 38 mg/dL — ABNORMAL LOW (ref >40)
LDL Cholesterol: 40 mg/dL (ref 0–99)
Total CHOL/HDL Ratio: 2.6 ratio
Triglycerides: 99 mg/dL (ref <150)
VLDL: 20 mg/dL (ref 0–40)

## 2024-04-19 LAB — MAGNESIUM: Magnesium: 2.3 mg/dL (ref 1.7–2.4)

## 2024-04-19 MED ORDER — PANTOPRAZOLE SODIUM 40 MG PO TBEC: 40.0000 mg | DELAYED_RELEASE_TABLET | Freq: Every day | ORAL | 1 refills | Status: DC

## 2024-04-19 NOTE — Plan of Care (Signed)
  Problem: Education: Goal: Knowledge of General Education information will improve Description: Including pain rating scale, medication(s)/side effects and non-pharmacologic comfort measures Outcome: Progressing   Problem: Health Behavior/Discharge Planning: Goal: Ability to manage health-related needs will improve Outcome: Progressing   Problem: Clinical Measurements: Goal: Ability to maintain clinical measurements within normal limits will improve Outcome: Progressing Goal: Will remain free from infection Outcome: Progressing Goal: Diagnostic test results will improve Outcome: Progressing Goal: Respiratory complications will improve Outcome: Progressing Goal: Cardiovascular complication will be avoided Outcome: Progressing   Problem: Activity: Goal: Risk for activity intolerance will decrease Outcome: Progressing   Problem: Nutrition: Goal: Adequate nutrition will be maintained Outcome: Progressing   Problem: Coping: Goal: Level of anxiety will decrease Outcome: Progressing   Problem: Elimination: Goal: Will not experience complications related to bowel motility Outcome: Progressing Goal: Will not experience complications related to urinary retention Outcome: Progressing   Problem: Safety: Goal: Ability to remain free from injury will improve Outcome: Progressing   Problem: Skin Integrity: Goal: Risk for impaired skin integrity will decrease Outcome: Progressing   Problem: Education: Goal: Knowledge of disease or condition will improve Outcome: Progressing Goal: Knowledge of secondary prevention will improve (MUST DOCUMENT ALL) Outcome: Progressing Goal: Knowledge of patient specific risk factors will improve (DELETE if not current risk factor) Outcome: Progressing   Problem: Ischemic Stroke/TIA Tissue Perfusion: Goal: Complications of ischemic stroke/TIA will be minimized Outcome: Progressing   Problem: Coping: Goal: Will verbalize positive feelings  about self Outcome: Progressing Goal: Will identify appropriate support needs Outcome: Progressing

## 2024-04-19 NOTE — Evaluation (Signed)
 Physical Therapy Evaluation Patient Details Name: Kara Dean MRN: [id number] DOB: February 24, 1933 Today's Date: 04/19/2024  History of Present Illness  Kara Dean is a 89 y.o. female with medical history significant of paroxysmal atrial fibrillation, prior stroke, hyperlipidemia, type 2 diabetes mellitus, GERD, diastolic CHF, CAD who presents to the emergency department due to blurred vision.  Most of the history was obtained from EDP and daughter at bedside.  Per report, last known well was Friday night (6/13) prior to bed, on waking up yesterday (Saturday -6/14), patient complaining of difficulty in being able to see from right eye and also presented with impaired balance and some dizziness compared to baseline, daughter was worried she may be having another stroke since presentation was similar to prior stroke.   Clinical Impression  Patient functioning near baseline for functional mobility and gait and has no significant difference between RLE and LLE strength, labored movement for sitting up at bedside and able to transfer to chair and ambulate in hallway without loss of balance. Patient tolerated sitting up in chair after therapy with her daughter present. Patient will benefit from continued skilled physical therapy in hospital and recommended venue below to increase strength, balance, endurance for safe ADLs and gait.           If plan is discharge home, recommend the following: A little help with walking and/or transfers;A little help with bathing/dressing/bathroom;Help with stairs or ramp for entrance;Assistance with cooking/housework   Can travel by private vehicle        Equipment Recommendations None recommended by PT  Recommendations for Other Services       Functional Status Assessment Patient has had a recent decline in their functional status and demonstrates the ability to make significant improvements in function in a reasonable and predictable amount of time.      Precautions / Restrictions Precautions Precautions: Fall Recall of Precautions/Restrictions: Intact Restrictions Weight Bearing Restrictions Per Provider Order: No      Mobility  Bed Mobility Overal bed mobility: Needs Assistance Bed Mobility: Supine to Sit     Supine to sit: Supervision     General bed mobility comments: increased time of scooting to EOB    Transfers Overall transfer level: Needs assistance Equipment used: Rolling walker (2 wheels) Transfers: Sit to/from Stand, Bed to chair/wheelchair/BSC Sit to Stand: Contact guard assist   Step pivot transfers: Contact guard assist       General transfer comment: labored movement, increased time    Ambulation/Gait Ambulation/Gait assistance: Contact guard assist, Min assist Gait Distance (Feet): 60 Feet Assistive device: Rolling walker (2 wheels) Gait Pattern/deviations: Decreased step length - left, Decreased stance time - right, Decreased stride length, Trunk flexed Gait velocity: decreased     General Gait Details: slow slightly labored movement without loss of balance, limited mostly due to c/o fatigue  Stairs            Wheelchair Mobility     Tilt Bed    Modified Rankin (Stroke Patients Only)       Balance Overall balance assessment: Needs assistance Sitting-balance support: Feet supported, No upper extremity supported Sitting balance-Leahy Scale: Good Sitting balance - Comments: seated at EOB   Standing balance support: Bilateral upper extremity supported, During functional activity, Reliant on assistive device for balance Standing balance-Leahy Scale: Fair Standing balance comment: using RW  Pertinent Vitals/Pain Pain Assessment Pain Assessment: No/denies pain    Home Living Family/patient expects to be discharged to:: Private residence Living Arrangements: Children;Other relatives Available Help at Discharge: Family;Available 24  hours/day Type of Home: Mobile home Home Access: Stairs to enter Entrance Stairs-Rails: Right;Left Entrance Stairs-Number of Steps: 3   Home Layout: One level Home Equipment: Agricultural consultant (2 wheels);Rollator (4 wheels);Shower seat;Grab bars - toilet;Grab bars - tub/shower      Prior Function Prior Level of Function : Needs assist       Physical Assist : ADLs (physical);Mobility (physical) Mobility (physical): Transfers;Gait;Stairs;Bed mobility ADLs (physical): Bathing;Dressing;Toileting;IADLs Mobility Comments: At least supervision assist for ambulation with RW in the home. ADLs Comments: Bathing, dressing, and toileting assist by daughter. Reports mostly lower body dressing assist. Able to food and groom on her own. IADL assist.     Extremity/Trunk Assessment   Upper Extremity Assessment Upper Extremity Assessment: Defer to OT evaluation LUE Deficits / Details: 4+/5 shoulder flexion adn abduction. 5/5 otherwise. Daughter reports the pt has residual weakness from the prior stroke. LUE Sensation: WNL LUE Coordination: WNL    Lower Extremity Assessment Lower Extremity Assessment: Generalized weakness    Cervical / Trunk Assessment Cervical / Trunk Assessment: Kyphotic  Communication   Communication Communication: No apparent difficulties    Cognition Arousal: Alert Behavior During Therapy: WFL for tasks assessed/performed   PT - Cognitive impairments: No apparent impairments                         Following commands: Intact       Cueing Cueing Techniques: Verbal cues, Tactile cues     General Comments      Exercises     Assessment/Plan    PT Assessment Patient needs continued PT services  PT Problem List Decreased strength;Decreased activity tolerance;Decreased balance;Decreased mobility       PT Treatment Interventions DME instruction;Gait training;Stair training;Functional mobility training;Therapeutic activities;Therapeutic  exercise;Balance training;Patient/family education    PT Goals (Current goals can be found in the Care Plan section)  Acute Rehab PT Goals Patient Stated Goal: return home with family to assist PT Goal Formulation: With patient/family Time For Goal Achievement: 04/23/24 Potential to Achieve Goals: Good    Frequency Min 3X/week     Co-evaluation PT/OT/SLP Co-Evaluation/Treatment: Yes Reason for Co-Treatment: To address functional/ADL transfers PT goals addressed during session: Mobility/safety with mobility;Balance;Proper use of DME OT goals addressed during session: ADL's and self-care       AM-PAC PT 6 Clicks Mobility  Outcome Measure Help needed turning from your back to your side while in a flat bed without using bedrails?: A Little Help needed moving from lying on your back to sitting on the side of a flat bed without using bedrails?: A Little Help needed moving to and from a bed to a chair (including a wheelchair)?: A Little Help needed standing up from a chair using your arms (e.g., wheelchair or bedside chair)?: A Little Help needed to walk in hospital room?: A Little Help needed climbing 3-5 steps with a railing? : A Little 6 Click Score: 18    End of Session   Activity Tolerance: Patient tolerated treatment well;Patient limited by fatigue Patient left: in chair;with call bell/phone within reach;with family/visitor present Nurse Communication: Mobility status PT Visit Diagnosis: Unsteadiness on feet (R26.81);Other abnormalities of gait and mobility (R26.89);Muscle weakness (generalized) (M62.81)    Time: 5784-6962 PT Time Calculation (min) (ACUTE ONLY): 18 min   Charges:  PT Evaluation $PT Eval Low Complexity: 1 Low PT Treatments $Therapeutic Activity: 8-22 mins PT General Charges $$ ACUTE PT VISIT: 1 Visit         12:06 PM, 04/19/24 Walton Guppy, MPT Physical Therapist with [facility] [phone] office [phone] mobile phone

## 2024-04-19 NOTE — Consult Note (Signed)
 I connected with  Kara Dean on 04/19/24 by a video enabled telemedicine application and verified that I am speaking with the correct person using two identifiers.   I discussed the limitations of evaluation and management by telemedicine. The patient expressed understanding and agreed to proceed.  Location of patient: [facility] Location of physician: [facility] hospital  Neurology Consultation Reason for Consult: stroke Referring Physician: Dr Doreene Gammon  CC: blurry vision  History is obtained from: patient, chart review  HPI: Kara Dean is a 89 y.o. female with h/o HTN, DM, HLD, CHF, CVA who presented with blurred vision. States she went to bed at around 10 pm on 04/16/2024. Woke up on 04/17/2024 at around 8am and noticed her vision was blurry described as seeing through glass mainly on right side. On ASA 81mg  daily. Reports her vision is improving. No headache. Blood glucose ws about 400, BP 152/104  LKN 04/16/2024 10pm Event happened at home No tpa as outside window No thrombectomy as no LVO mRS 0  ROS: All other systems reviewed and negative except as noted in the HPI.   Past Medical History:  Diagnosis Date   Anxiety    Carotid artery disease (HCC)    Carotid US  11/2018:  bilateral ICA 1-39 >> repeat 2 years.    Cataract    Crohn's colitis (HCC)    CVA (cerebral infarction)    No deficits   Depression    Diabetes mellitus    type 2   Dyslipidemia    GERD (gastroesophageal reflux disease)    History of TIAs    no deficits   Hyperlipidemia    Hypertension    Hyponatremia    Late effect of cerebrovascular accident (CVA) 03/26/2010   Monitor 12/2023: no atrial fibrillation         Nodular basal cell carcinoma (BCC) 04/22/2018   left prox, nasal root (tx p bx)   Osteoarthritis    SCC (squamous cell carcinoma) 04/22/2018   left shin,superior-(tx p bx), Left shin,inferior-(tx p bx )    Family History  Problem Relation Age of Onset   Coronary artery  disease Sister        stent x 3   Seizures Sister    Coronary artery disease Brother        stent   Hypertension Mother    Stroke Mother    Emphysema Father    Alcohol abuse Brother    Lung disease Brother    Heart attack Daughter     Social History:  reports that she has never smoked. She has been exposed to tobacco smoke. She has never used smokeless tobacco. She reports that she does not drink alcohol and does not use drugs.  Medications Prior to Admission  Medication Sig Dispense Refill Last Dose/Taking   amLODipine  (NORVASC ) 10 MG tablet Take 1 tablet (10 mg total) by mouth daily. 90 tablet 1 04/16/2024   aspirin  81 MG chewable tablet Chew 1 tablet (81 mg total) by mouth daily.   04/16/2024   atorvastatin  (LIPITOR) 20 MG tablet Take 1 tablet (20 mg total) by mouth daily. 90 tablet 1 04/16/2024   citalopram  (CELEXA ) 20 MG tablet Take 1 tablet (20 mg total) by mouth daily. 90 tablet 1 04/16/2024   fluticasone  (FLONASE ) 50 MCG/ACT nasal spray USE 2 SPRAYS IN EACH NOSTRIL ONCE DAILY 16 g 5 04/16/2024   furosemide  (LASIX ) 20 MG tablet Take 20 mg by mouth daily as needed for fluid or edema.  Past Week   LANTUS  SOLOSTAR 100 UNIT/ML Solostar Pen Inject 48 Units into the skin daily. 15 mL 5 04/16/2024   LORazepam  (ATIVAN ) 0.5 MG tablet Take 1 tablet (0.5 mg total) by mouth 2 (two) times daily. 60 tablet 5 Past Week   metoprolol  succinate (TOPROL -XL) 25 MG 24 hr tablet Take 1 tablet (25 mg total) by mouth daily. 90 tablet 1 04/16/2024   Multiple Vitamin (MULTIVITAMIN) tablet Take 1 tablet by mouth daily.   04/16/2024   olmesartan  (BENICAR ) 40 MG tablet Take 1 tablet (40 mg total) by mouth daily. 90 tablet 1 04/16/2024   potassium chloride  (KLOR-CON ) 10 MEQ tablet Take 1 tablet (10 mEq total) by mouth daily. 90 tablet 1 04/16/2024   tolterodine  (DETROL  LA) 4 MG 24 hr capsule Take 1 capsule (4 mg total) by mouth daily. 180 capsule 1 04/16/2024   Insulin  Pen Needle (PIP PEN NEEDLES 32G X ) 32G X 4 MM  MISC UAD 100 each 0    ONETOUCH ULTRA test strip CHECK BLOOD SUGER UP TO 3 TIMES A DAY 300 strip 3       Exam: Current vital signs: BP (!) 140/43 (BP Location: Right Arm)   Pulse 63   Temp 98.2 F (36.8 C) (Oral)   Resp 16   Ht 5' 1 (1.549 m)   Wt 54.9 kg   SpO2 95%   BMI 22.87 kg/m  Vital signs in last 24 hours: Temp:  [97.5 F (36.4 C)-98.9 F (37.2 C)] 98.2 F (36.8 C) (06/16 0836) Pulse Rate:  [57-63] 63 (06/16 0836) Resp:  [16-20] 16 (06/16 0836) BP: (118-144)/(37-49) 140/43 (06/16 0836) SpO2:  [95 %-99 %] 95 % (06/16 0836)   Physical Exam  Constitutional: Appears well-developed and well-nourished.  Psych: Affect appropriate to situation Neuro: Aox3, CN grossly intact but visual field and vision exam limited via tele, anti gravity strength in all extremities, sensory intact, FTN intact  NIHSS 0  I have reviewed labs in epic and the results pertinent to this consultation are: CBC:  Recent Labs  Lab 04/17/24 2141  WBC 7.6  NEUTROABS 4.7  HGB 12.1  HCT 36.2  MCV 91.4  PLT 187    Basic Metabolic Panel:  Lab Results  Component Value Date   NA 141 04/19/2024   K 3.8 04/19/2024   CO2 24 04/19/2024   GLUCOSE 70 04/19/2024   BUN 21 04/19/2024   CREATININE 1.25 (H) 04/19/2024   CALCIUM  9.7 04/19/2024   GFRNONAA 41 (L) 04/19/2024   GFRAA 51 (L) 08/21/2020   Lipid Panel:  Lab Results  Component Value Date   LDLCALC 40 04/19/2024   HgbA1c:  Lab Results  Component Value Date   HGBA1C 9.1 (H) 03/08/2024   Urine Drug Screen:     Component Value Date/Time   LABOPIA NONE DETECTED 04/18/2024 0109   COCAINSCRNUR NONE DETECTED 04/18/2024 0109   LABBENZ NONE DETECTED 04/18/2024 0109   AMPHETMU NONE DETECTED 04/18/2024 0109   THCU NONE DETECTED 04/18/2024 0109   LABBARB NONE DETECTED 04/18/2024 0109    Alcohol Level No results found for: ETH   I have reviewed the images obtained:  CT Head without contrast 04/18/2024: No acute intracranial  abnormality. Stable atrophy and white matter disease.   CT angio Head and Neck with contrast 6/15/ 2025: Atherosclerotic changes at the aortic arch, carotid bifurcations, cavernous internal carotid arteries and epidural margin of both vertebral arteries without significant stenosis. No significant intracranial stenosis or large vessel occlusion.  MRI Brain  wo contrast 04/19/2024: No acute stroke  ASSESSMENT/PLAN: 89yo F with transient blurry vision.   Transient blurry vision - DDX include MRI negative stroke vs BRAO vs vision disturbance due to hyperglycemia  Recommendations: - Continue asa 81mg  daily for sec stroke prevention - LDL 50, continue lipito 20mg  daily - F/u with ophthalmology to look for BRAO - Better control of blood glucose - PT/OT -TTE pending - Reviewed loop recorded report, no arrhythmia - Discussed plan with DR Mason Sole via secure chat and daughter as well as patient - F/u with Dr Janett Medin in 2-3 months - Stroke education  Thank you for allowing us  to participate in the care of this patient. If you have any further questions, please contact  me or neurohospitalist.   Roxy Cordial Epilepsy [facility] neurohospitalist

## 2024-04-19 NOTE — Plan of Care (Signed)

## 2024-04-19 NOTE — TOC Transition Note (Signed)
 Transition of Care [facility]) - Discharge Note   Patient Details  Name: Kara Dean MRN: [id number] Date of Birth: 1933-05-17  [facility] CM/SW Contact:  Orelia Binet, RN Phone Number: 04/19/2024, 11:54 AM   Clinical Narrative:   Discharge planning for patient to return home. PT is recommending HHPT. Daughter at the bedside and they are agreeable. CMS options reviewed. Referral sent to [facility] with Alejos Amsterdam, MD aware to order.     Final next level of care: Home w Home Health Services Barriers to Discharge: Barriers Resolved   Patient Goals and CMS Choice Patient states their goals for this hospitalization and ongoing recovery are:: to return home. CMS Medicare.gov Compare Post Acute Care list provided to:: Patient Represenative (must comment) Choice offered to / list presented to : Adult Children     Discharge Placement        Name of family member notified: Daughter Patient and family notified of of transfer: 04/19/24  Discharge Plan and Services Additional resources added to the After Visit Summary for      [facility] Arranged: PT [facility] Agency: [facility] Date [facility] Agency Contacted: 04/19/24 Time HH Agency Contacted: 1129 Representative spoke with at [facility] Agency: [facility]  Social Drivers of Health (SDOH) Interventions SDOH Screenings   Food Insecurity: No Food Insecurity (04/18/2024)  Housing: Low Risk  (04/18/2024)  Transportation Needs: No Transportation Needs (04/18/2024)  Utilities: Not At Risk (04/18/2024)  Alcohol Screen: Low Risk  (03/12/2023)  Depression (PHQ2-9): Medium Risk (03/15/2024)  Financial Resource Strain: Low Risk  (03/15/2024)  Physical Activity: Insufficiently Active (03/15/2024)  Social Connections: Socially Isolated (04/18/2024)  Stress: No Stress Concern Present (03/15/2024)  Tobacco Use: Medium Risk (04/17/2024)  Health Literacy: Adequate Health Literacy (03/15/2024)     Readmission Risk Interventions    10/19/2023    1:18 PM   Readmission Risk Prevention Plan  Medication Screening Complete  Transportation Screening Complete

## 2024-04-19 NOTE — Plan of Care (Signed)
  Problem: Acute Rehab PT Goals(only PT should resolve) Goal: Pt Will Go Supine/Side To Sit Outcome: Progressing Flowsheets (Taken 04/19/2024 1208) Pt will go Supine/Side to Sit: with supervision Goal: Patient Will Transfer Sit To/From Stand Outcome: Progressing Flowsheets (Taken 04/19/2024 1208) Patient will transfer sit to/from stand: with supervision Goal: Pt Will Transfer Bed To Chair/Chair To Bed Outcome: Progressing Flowsheets (Taken 04/19/2024 1208) Pt will Transfer Bed to Chair/Chair to Bed: with supervision Goal: Pt Will Ambulate Outcome: Progressing Flowsheets (Taken 04/19/2024 1208) Pt will Ambulate:  75 feet  with supervision  with rolling walker   12:10 PM, 04/19/24 Walton Guppy, MPT Physical Therapist with [facility] [phone] office [phone] mobile phone

## 2024-04-19 NOTE — Discharge Summary (Signed)
 Physician Discharge Summary  [name]:[id number] DOB: 1933/06/19 DOA: 04/17/2024  PCP: Delfina Feller, FNP  Admit date: 04/17/2024  Discharge date: 04/19/2024  Admitted From:Home  Disposition:  Home  Recommendations for Outpatient Follow-up:  Follow up with PCP in 1-2 weeks Follow-up with ophthalmology outpatient for evaluation if BRAO; patient given information to schedule outpatient appointment Continue on aspirin  daily as recommended per neurology Continue other home medications as prior  Home Health: Yes with PT  Equipment/Devices:  Discharge Condition:Stable  CODE STATUS: Full  Diet recommendation: Heart Healthy/carb modified  Brief/Interim Summary: Kara Dean is a 89 y.o. female with medical history significant of paroxysmal atrial fibrillation, prior stroke, lipidemia, type 2 diabetes mellitus, GERD, diastolic CHF, CAD who presents to the emergency department due to blurred vision.  Most of the history was obtained from EDP and daughter at bedside.  Per report, last known well was Friday night (6/13) prior to bed, on waking up yesterday (Saturday -6/14), patient complaining of difficulty in being able to see from right eye and also presented with impaired balance and some dizziness compared to baseline, daughter was worried she may be having another stroke since presentation was similar to prior stroke.   Patient was admitted for evaluation of CVA, but brain MRI returned without any findings of CVA.  She was evaluated by neurology and was thought to maybe have blurriness due to BRAO versus hyperglycemia.  No significant findings on 2D echocardiogram and patient has been assessed by PT with recommendations for home health services.  She has otherwise done well and has improvement in her vision back to baseline.  She is stable for discharge and is to remain on her home aspirin  and will follow-up with ophthalmology with information provided.  No other acute events  or concerns noted.  Discharge Diagnoses:  Principal Problem:   Blurred vision Active Problems:   Mixed hyperlipidemia   Essential hypertension   Gastroesophageal reflux disease without esophagitis   Chronic kidney disease, stage 3b (HCC)   Chronic diastolic CHF (congestive heart failure) (HCC)   Type 2 diabetes mellitus with hyperglycemia (HCC)   Hypoalbuminemia due to protein-calorie malnutrition (HCC)   CAD (coronary artery disease)  Principal discharge diagnosis: Transient visual deficit likely secondary to BRAO versus hyperglycemia.  Discharge Instructions  Discharge Instructions     Ambulatory referral to Ophthalmology   Complete by: As directed    Diet - low sodium heart healthy   Complete by: As directed    Increase activity slowly   Complete by: As directed       Allergies as of 04/19/2024       Reactions   Codeine Other (See Comments)   Unknown    Morphine Other (See Comments)   Unknown    Niacin Other (See Comments)   Unknown    Latex Rash   Sulfa Antibiotics Rash   Sulfonamide Derivatives Rash        Medication List     TAKE these medications    amLODipine  10 MG tablet Commonly known as: NORVASC  Take 1 tablet (10 mg total) by mouth daily.   aspirin  81 MG chewable tablet Chew 1 tablet (81 mg total) by mouth daily.   atorvastatin  20 MG tablet Commonly known as: LIPITOR Take 1 tablet (20 mg total) by mouth daily.   citalopram  20 MG tablet Commonly known as: CeleXA  Take 1 tablet (20 mg total) by mouth daily.   fluticasone  50 MCG/ACT nasal spray Commonly known as: FLONASE  USE 2  SPRAYS IN EACH NOSTRIL ONCE DAILY   furosemide  20 MG tablet Commonly known as: LASIX  Take 20 mg by mouth daily as needed for fluid or edema.   Lantus  SoloStar 100 UNIT/ML Solostar Pen Generic drug: insulin  glargine Inject 48 Units into the skin daily.   LORazepam  0.5 MG tablet Commonly known as: ATIVAN  Take 1 tablet (0.5 mg total) by mouth 2 (two) times  daily.   metoprolol  succinate 25 MG 24 hr tablet Commonly known as: TOPROL -XL Take 1 tablet (25 mg total) by mouth daily.   multivitamin tablet Take 1 tablet by mouth daily.   olmesartan  40 MG tablet Commonly known as: BENICAR  Take 1 tablet (40 mg total) by mouth daily.   OneTouch Ultra test strip Generic drug: glucose blood CHECK BLOOD SUGER UP TO 3 TIMES A DAY   pantoprazole  40 MG tablet Commonly known as: PROTONIX  Take 1 tablet (40 mg total) by mouth daily.   Pip Pen Needles 32G x 32G X 4 MM Misc Generic drug: Insulin  Pen Needle UAD   potassium chloride  10 MEQ tablet Commonly known as: KLOR-CON  Take 1 tablet (10 mEq total) by mouth daily.   tolterodine  4 MG 24 hr capsule Commonly known as: Detrol  LA Take 1 capsule (4 mg total) by mouth daily.        Follow-up Information     [facility].   Specialty: Home Health Services Why: PT will call to schedule your first home visit. Contact information: [address] [place] [address] [phone]         [facility] Go to.   Contact information: [address] [place] [address] [phone]                Allergies  Allergen Reactions   Codeine Other (See Comments)    Unknown    Morphine Other (See Comments)    Unknown    Niacin Other (See Comments)    Unknown    Latex Rash   Sulfa Antibiotics Rash   Sulfonamide Derivatives Rash    Consultations: Neurology   Procedures/Studies: MR BRAIN WO CONTRAST Result Date: 04/19/2024 CLINICAL DATA:  89 year old female with neurologic deficit, right eye blurred vision. EXAM: MRI HEAD WITHOUT CONTRAST TECHNIQUE: Multiplanar, multiecho pulse sequences of the brain and surrounding structures were obtained without intravenous contrast. COMPARISON:  CT head, CTA head and neck yesterday. Brain MRI 10/18/2023. FINDINGS: Brain: No restricted diffusion to suggest acute infarction. No midline  shift, mass effect, evidence of mass lesion, ventriculomegaly, extra-axial collection or acute intracranial hemorrhage. Cervicomedullary junction and pituitary are within normal limits. Chronically advanced cerebral white matter disease, confluent bilateral white matter T2 and FLAIR hyperintensity. Multifocal chronic bilateral deep gray nuclei lacunar infarcts, predominantly in the thalami. Chronic brainstem lacunar infarcts. Associated cerebellar peduncle Wallerian degeneration. Small chronic left cerebellar infarcts. Comparatively mild chronic cerebral blood products on SWI. No new signal abnormality identified when compared to December. Vascular: Major intracranial vascular flow voids are stable. Skull and upper cervical spine: Chronic cervical spine degeneration, grossly stable. Normal background bone marrow signal. Sinuses/Orbits: Stable and negative. Other: Stable and negative visible scalp and face. IMPRESSION: 1. No acute intracranial abnormality. 2. Advanced chronic small vessel disease, stable since December. Electronically Signed   By: Marlise Simpers M.D.   On: 04/19/2024 11:24   ECHOCARDIOGRAM COMPLETE Result Date: 04/18/2024    ECHOCARDIOGRAM REPORT   Patient Name:   NORETTA FRIER Date of Exam: 04/18/2024 Medical  Rec #:  [id number]      Height:       61.0 in Accession #:    [id number]     Weight:       121.0 lb Date of Birth:  Mar 06, 1933     BSA:          1.526 m Patient Age:    89 years       BP:           158/73 mmHg Patient Gender: F              HR:           63 bpm. Exam Location:  [facility] Procedure: 2D Echo, Cardiac Doppler and Color Doppler (Both Spectral and Color            Flow Doppler were utilized during procedure). Indications:    Stroke I63.9  History:        Patient has prior history of Echocardiogram examinations, most                 recent 10/19/2023. CHF, CAD, Stroke; Risk Factors:Hypertension,                 Dyslipidemia and Diabetes.  Sonographer:    Kip Peon RDCS Referring  Phys: [id number] OLADAPO ADEFESO IMPRESSIONS  1. Left ventricular ejection fraction, by estimation, is 55 to 60%. The left ventricle has normal function. The left ventricle has no regional wall motion abnormalities. There is mild left ventricular hypertrophy. Left ventricular diastolic parameters are indeterminate.  2. Right ventricular systolic function is normal. The right ventricular size is normal. There is moderately elevated pulmonary artery systolic pressure. The estimated right ventricular systolic pressure is 47.6 mmHg.  3. The mitral valve is degenerative. Trivial mitral valve regurgitation. Moderate mitral annular calcification.  4. The aortic valve is tricuspid. Aortic valve regurgitation is mild. Mild aortic valve stenosis. Vmax 2.4 m/s, MG , DI 0.54  5. The inferior vena cava is normal in size with greater than 50% respiratory variability, suggesting right atrial pressure of 3 mmHg. FINDINGS  Left Ventricle: Left ventricular ejection fraction, by estimation, is 55 to 60%. The left ventricle has normal function. The left ventricle has no regional wall motion abnormalities. The left ventricular internal cavity size was normal in size. There is  mild left ventricular hypertrophy. Left ventricular diastolic parameters are indeterminate. Right Ventricle: The right ventricular size is normal. No increase in right ventricular wall thickness. Right ventricular systolic function is normal. There is moderately elevated pulmonary artery systolic pressure. The tricuspid regurgitant velocity is 3.34 m/s, and with an assumed right atrial pressure of 3 mmHg, the estimated right ventricular systolic pressure is 47.6 mmHg. Left Atrium: Left atrial size was normal in size. Right Atrium: Right atrial size was normal in size. Pericardium: There is no evidence of pericardial effusion. Mitral Valve: The mitral valve is degenerative in appearance. Moderate mitral annular calcification. Trivial mitral valve regurgitation.  Tricuspid Valve: The tricuspid valve is normal in structure. Tricuspid valve regurgitation is mild. Aortic Valve: The aortic valve is tricuspid. Aortic valve regurgitation is mild. Aortic regurgitation PHT measures 484 msec. Mild aortic stenosis is present. Pulmonic Valve: The pulmonic valve was not well visualized. Pulmonic valve regurgitation is trivial. Aorta: The aortic root and ascending aorta are structurally normal, with no evidence of dilitation. Venous: The inferior vena cava is normal in size with greater than 50% respiratory variability, suggesting right atrial pressure of 3 mmHg. IAS/Shunts: The interatrial  septum was not well visualized.  LEFT VENTRICLE PLAX 2D LVIDd:         4.00 cm LVIDs:         2.80 cm LV PW:         1.10 cm LV IVS:        1.10 cm LVOT diam:     1.80 cm LV SV:         76 LV SV Index:   50 LVOT Area:     2.54 cm  RIGHT VENTRICLE             IVC RV Basal diam:  4.00 cm     IVC diam: 1.60 cm RV Mid diam:    3.00 cm RV S prime:     13.50 cm/s TAPSE (M-mode): 2.4 cm LEFT ATRIUM             Index        RIGHT ATRIUM           Index LA diam:        4.00 cm 2.62 cm/m   RA Area:     13.20 cm LA Vol (A2C):   46.8 ml 30.67 ml/m  RA Volume:   31.20 ml  20.45 ml/m LA Vol (A4C):   30.8 ml 20.19 ml/m LA Biplane Vol: 38.0 ml 24.91 ml/m  AORTIC VALVE LVOT Vmax:   115.00 cm/s LVOT Vmean:  80.000 cm/s LVOT VTI:    0.298 m AI PHT:      484 msec  AORTA Ao Root diam: 2.80 cm Ao Asc diam:  2.80 cm MITRAL VALVE                TRICUSPID VALVE MV Area (PHT): 2.95 cm     TR Peak grad:   44.6 mmHg MV Decel Time: 257 msec     TR Vmax:        334.00 cm/s MR Peak grad: 67.9 mmHg MR Vmax:      412.00 cm/s   SHUNTS MV E velocity: 125.00 cm/s  Systemic VTI:  0.30 m MV A velocity: 101.00 cm/s  Systemic Diam: 1.80 cm MV E/A ratio:  1.24 Carson Clara MD Electronically signed by Carson Clara MD Signature Date/Time: 04/18/2024/10:52:33 AM    Final    CT ANGIO HEAD NECK W WO CM Result Date:  04/18/2024 EXAM: CT HEAD WITHOUT CTA HEAD AND NECK WITH AND WITHOUT 04/18/2024 12:45:52 AM TECHNIQUE: CTA of the head and neck was performed with and without the administration of intravenous contrast. Noncontrast CT of the head with reconstructed 2-D images are also provided for review. Multiplanar 2D and/or 3D reformatted images are provided for review. Automated exposure control, iterative reconstruction, and/or weight based adjustment of the mA/kV was utilized to reduce the radiation dose to as low as reasonably achievable. COMPARISON: CTA head and neck 10/19/2023 and MR head without contrast 10/18/2023. CLINICAL HISTORY: Neuro deficit, acute, stroke suspected. Patient arrives from [facility]IB [facility] where patient complains of R eye blurred vision that started at 10 am today. States she went to bed normally last night despite staying awake later than normal. Patient reports her CBG being 400 this afternoon, treated herself with insulin , EMS noted a CBG of 257, started NS infusion through 20 g IV in the LAC. Patient bradycardic in the 50s-60s w/ EMS. Daughter reports unsteady gait for weeks. Hx of stroke in December. Aox4. FINDINGS: CT HEAD: BRAIN AND VENTRICLES: Mild generalized atrophy and moderate diffuse white matter disease are stable.  No acute intracranial hemorrhage. No mass effect or midline shift. No extra-axial fluid collection. Gray-white differentiation is maintained. No hydrocephalus. ORBITS: No acute abnormality. SINUSES: No acute abnormality. SOFT TISSUES AND SKULL: No acute abnormality. CTA NECK: AORTIC ARCH AND ARCH VESSELS: A 4-vessel arch configuration is again seen. Atherosclerotic changes are present in the distal arch and at the origin of the left subclavian artery without significant stenosis. No dissection or arterial injury. CERVICAL CAROTID ARTERIES: Atherosclerotic changes are present at the right carotid bifurcation without significant stenosis. Atherosclerotic calcifications are present in the  distal left common carotid artery no focal stenosis is present. No dissection or arterial injury. CERVICAL VERTEBRAL ARTERIES: Focal atherosclerotic calcification is present at the origin of the dominant right vertebral artery without significant stenosis. No dissection or arterial injury. VISUALIZED LUNGS AND MEDIASTINUM: Unremarkable. SOFT TISSUES: No acute abnormality. BONES: No acute abnormality. CTA HEAD: ANTERIOR CIRCULATION: Atherosclerotic calcifications are present within the cavernous internal carotid arteries bilaterally. No significant stenosis no change is present. The MCA bifurcations are normal. The ACA and MCA branch vessels are within normal limits. No aneurysm. POSTERIOR CIRCULATION: Atherosclerotic calcifications are present in the dural margin of both vertebral arteries without significant stenosis. No significant stenosis of the posterior cerebral arteries. No significant stenosis of the basilar artery. No significant stenosis of the vertebral arteries. No aneurysm. OTHER: No dural venous sinus thrombosis on this non-dedicated study. IMPRESSION: 1. No acute intracranial abnormality. 2. Stable atrophy and white matter disease. 3. Atherosclerotic changes at the aortic arch, carotid bifurcations, cavernous internal carotid arteries and epidural margin of both vertebral arteries without significant stenosis. 4. No significant intracranial stenosis or large vessel occlusion. Electronically signed by: Audree Leas MD 04/18/2024 05:28 AM EDT RP Workstation: [id number]     Discharge Exam: Vitals:   04/19/24 0255 04/19/24 0836  BP: (!) 144/48 (!) 140/43  Pulse: (!) 59 63  Resp: 18 16  Temp: 98.3 F (36.8 C) 98.2 F (36.8 C)  SpO2: 96% 95%   Vitals:   04/18/24 1442 04/18/24 1823 04/19/24 0255 04/19/24 0836  BP: (!) 136/49 (!) 118/37 (!) 144/48 (!) 140/43  Pulse: (!) 57 (!) 57 (!) 59 63  Resp: 20 20 18 16   Temp: (!) 97.5 F (36.4 C) 98.3 F (36.8 C) 98.3 F (36.8 C) 98.2 F  (36.8 C)  TempSrc: Oral Oral Oral Oral  SpO2: 96% 97% 96% 95%  Weight:      Height:        General: Pt is alert, awake, not in acute distress Cardiovascular: RRR, S1/S2 +, no rubs, no gallops Respiratory: CTA bilaterally, no wheezing, no rhonchi Abdominal: Soft, NT, ND, bowel sounds + Extremities: no edema, no cyanosis    The results of significant diagnostics from this hospitalization (including imaging, microbiology, ancillary and laboratory) are listed below for reference.     Microbiology: No results found for this or any previous visit (from the past 240 hours).   Labs: BNP (last 3 results) No results for input(s): BNP in the last 8760 hours. Basic Metabolic Panel: Recent Labs  Lab 04/17/24 2141 04/19/24 0333  NA 135 141  K 4.1 3.8  CL 101 107  CO2 24 24  GLUCOSE 237* 70  BUN 23 21  CREATININE 1.45* 1.25*  CALCIUM  9.4 9.7  MG  --  2.3   Liver Function Tests: Recent Labs  Lab 04/17/24 2141  AST 18  ALT 12  ALKPHOS 61  BILITOT 0.7  PROT 6.5  ALBUMIN 3.4*   No  results for input(s): LIPASE, AMYLASE in the last 168 hours. No results for input(s): AMMONIA in the last 168 hours. CBC: Recent Labs  Lab 04/17/24 2141  WBC 7.6  NEUTROABS 4.7  HGB 12.1  HCT 36.2  MCV 91.4  PLT 187   Cardiac Enzymes: No results for input(s): CKTOTAL, CKMB, CKMBINDEX, TROPONINI in the last 168 hours. BNP: Invalid input(s): POCBNP CBG: Recent Labs  Lab 04/18/24 1954 04/19/24 0010 04/19/24 0336 04/19/24 0717 04/19/24 1143  GLUCAP 222* 137* 77 77 210*   D-Dimer No results for input(s): DDIMER in the last 72 hours. Hgb A1c No results for input(s): HGBA1C in the last 72 hours. Lipid Profile Recent Labs    04/19/24 0333  CHOL 98  HDL 38*  LDLCALC 40  TRIG 99  CHOLHDL 2.6   Thyroid  function studies No results for input(s): TSH, T4TOTAL, T3FREE, THYROIDAB in the last 72 hours.  Invalid input(s): FREET3 Anemia work up No  results for input(s): VITAMINB12, FOLATE, FERRITIN, TIBC, IRON, RETICCTPCT in the last 72 hours. Urinalysis    Component Value Date/Time   COLORURINE AMBER (A) 05/11/2021 1559   APPEARANCEUR Clear 02/17/2023 0940   LABSPEC 1.021 05/11/2021 1559   PHURINE 5.0 05/11/2021 1559   GLUCOSEU Negative 02/17/2023 0940   HGBUR NEGATIVE 05/11/2021 1559   BILIRUBINUR Negative 02/17/2023 0940   KETONESUR 20 (A) 05/11/2021 1559   PROTEINUR 2+ (A) 02/17/2023 0940   PROTEINUR NEGATIVE 05/11/2021 1559   UROBILINOGEN 0.2 11/09/2010 2231   NITRITE Negative 02/17/2023 0940   NITRITE NEGATIVE 05/11/2021 1559   LEUKOCYTESUR Trace (A) 02/17/2023 0940   LEUKOCYTESUR TRACE (A) 05/11/2021 1559   Sepsis Labs Recent Labs  Lab 04/17/24 2141  WBC 7.6   Microbiology No results found for this or any previous visit (from the past 240 hours).   Time coordinating discharge: 35 minutes  SIGNED:   [name] Dill, DO [facility] 04/19/2024, 12:18 PM  If 7PM-7AM, please contact night-coverage [email or web address]

## 2024-04-19 NOTE — Plan of Care (Signed)
  Problem: Acute Rehab OT Goals (only OT should resolve) Goal: Pt. Will Perform Grooming Flowsheets (Taken 04/19/2024 0935) Pt Will Perform Grooming: with modified independence Goal: Pt. Will Perform Lower Body Dressing Flowsheets (Taken 04/19/2024 0935) Pt Will Perform Lower Body Dressing:  with min assist  with adaptive equipment  sitting/lateral leans Goal: Pt. Will Transfer To Toilet Flowsheets (Taken 04/19/2024 0935) Pt Will Transfer to Toilet:  with modified independence  ambulating Goal: Pt. Will Perform Toileting-Clothing Manipulation Flowsheets (Taken 04/19/2024 0935) Pt Will Perform Toileting - Clothing Manipulation and hygiene:  with modified independence  sitting/lateral leans Goal: Pt/Caregiver Will Perform Home Exercise Program Flowsheets (Taken 04/19/2024 0935) Pt/caregiver will Perform Home Exercise Program:  Increased strength  Left upper extremity  Independently  Bensyn Bornemann OT, MOT

## 2024-04-19 NOTE — Evaluation (Signed)
 Occupational Therapy Evaluation Patient Details Name: Kara Dean MRN: [id number] DOB: 1933-06-26 Today's Date: 04/19/2024   History of Present Illness   Kara Dean is a 89 y.o. female with medical history significant of paroxysmal atrial fibrillation, prior stroke, hyperlipidemia, type 2 diabetes mellitus, GERD, diastolic CHF, CAD who presents to the emergency department due to blurred vision.  Most of the history was obtained from EDP and daughter at bedside.  Per report, last known well was Friday night (6/13) prior to bed, on waking up yesterday (Saturday -6/14), patient complaining of difficulty in being able to see from right eye and also presented with impaired balance and some dizziness compared to baseline, daughter was worried she may be having another stroke since presentation was similar to prior stroke. (per DO)     Clinical Impressions Pt agreeable to OT and PT co-evaluation. Pt reports R eye vision is back to normal. Pt able to read the board several feet away but did have some difficulty with R to L visual tracking. No deficits noted in visual fields. Extended time needed for bed mobility and CGA for ambulation/transfers with RW. Pt assisted for lower body ADL's at baseline. Mild weakness in L shoulder which daughter reported is residual from the previous stroke. Pt left in the chair with call bell within reach and family present. Pt will benefit from continued OT in the hospital and recommended venue below to increase strength, balance, and endurance for safe ADL's.        If plan is discharge home, recommend the following:   A little help with walking and/or transfers;A lot of help with bathing/dressing/bathroom;Assistance with cooking/housework;Assist for transportation;Help with stairs or ramp for entrance     Functional Status Assessment   Patient has had a recent decline in their functional status and demonstrates the ability to make significant improvements in  function in a reasonable and predictable amount of time.     Equipment Recommendations   None recommended by OT             Precautions/Restrictions   Precautions Precautions: Fall Recall of Precautions/Restrictions: Intact Restrictions Weight Bearing Restrictions Per Provider Order: No     Mobility Bed Mobility Overal bed mobility: Needs Assistance Bed Mobility: Supine to Sit     Supine to sit: Supervision     General bed mobility comments: labored movement; extended time    Transfers Overall transfer level: Needs assistance Equipment used: Rolling walker (2 wheels) Transfers: Sit to/from Stand, Bed to chair/wheelchair/BSC Sit to Stand: Contact guard assist     Step pivot transfers: Contact guard assist     General transfer comment: mildly unsteady with RW; CGA for safety      Balance Overall balance assessment: Needs assistance Sitting-balance support: No upper extremity supported, Feet supported Sitting balance-Leahy Scale: Good Sitting balance - Comments: seated at EOB   Standing balance support: Bilateral upper extremity supported, During functional activity, Reliant on assistive device for balance Standing balance-Leahy Scale: Fair Standing balance comment: using RW                           ADL either performed or assessed with clinical judgement   ADL Overall ADL's : Needs assistance/impaired     Grooming: Set up;Sitting   Upper Body Bathing: Set up;Sitting   Lower Body Bathing: Maximal assistance;Sitting/lateral leans   Upper Body Dressing : Set up;Sitting   Lower Body Dressing: Maximal assistance;Sitting/lateral leans   Toilet  Transfer: Contact guard assist;Ambulation;Rolling walker (2 wheels) Toilet Transfer Details (indicate cue type and reason): Simulated via EOB to chair and ambulation in the hall with RW. Toileting- Clothing Manipulation and Hygiene: Moderate assistance;Maximal assistance;Sitting/lateral lean        Functional mobility during ADLs: Contact guard assist;Rolling walker (2 wheels)       Vision Baseline Vision/History: 1 Wears glasses Ability to See in Adequate Light: 1 Impaired Patient Visual Report: Blurring of vision (R eye blurry vision is normal today according to the pt.) Vision Assessment?: Yes Eye Alignment: Within Functional Limits Ocular Range of Motion: Within Functional Limits Alignment/Gaze Preference: Within Defined Limits Tracking/Visual Pursuits: Other (comment) (Pt noted to lose tracking when going from R upper to L lower quadrant.) Visual Fields: No apparent deficits     Perception Perception: Not tested       Praxis Praxis: Not tested       Pertinent Vitals/Pain Pain Assessment Pain Assessment: No/denies pain     Extremity/Trunk Assessment Upper Extremity Assessment Upper Extremity Assessment: Generalized weakness;LUE deficits/detail;Right hand dominant LUE Deficits / Details: 4+/5 shoulder flexion adn abduction. 5/5 otherwise. Daughter reports the pt has residual weakness from the prior stroke. LUE Sensation: WNL LUE Coordination: WNL   Lower Extremity Assessment Lower Extremity Assessment: Defer to PT evaluation   Cervical / Trunk Assessment Cervical / Trunk Assessment: Kyphotic   Communication Communication Communication: No apparent difficulties   Cognition Arousal: Alert Behavior During Therapy: WFL for tasks assessed/performed Cognition: No apparent impairments                               Following commands: Intact       Cueing  General Comments   Cueing Techniques: Verbal cues;Tactile cues                 Home Living Family/patient expects to be discharged to:: Private residence Living Arrangements: Children;Other relatives Available Help at Discharge: Family;Available 24 hours/day Type of Home: Mobile home Home Access: Stairs to enter Entrance Stairs-Number of Steps: 3 Entrance Stairs-Rails:  Right;Left Home Layout: One level     Bathroom Shower/Tub: Chief Strategy Officer: Standard     Home Equipment: Agricultural consultant (2 wheels);Rollator (4 wheels);Shower seat;Grab bars - toilet;Grab bars - tub/shower      Lives With: Daughter    Prior Functioning/Environment Prior Level of Function : Needs assist       Physical Assist : ADLs (physical);Mobility (physical) Mobility (physical): Transfers;Gait;Stairs ADLs (physical): Bathing;Dressing;Toileting;IADLs Mobility Comments: At least supervision assist for ambulation with RW in the home. ADLs Comments: Bathing, dressing, and toileting assist by daughter. Reports mostly lower body dressing assist. Able to food and groom on her own. IADL assist.    OT Problem List: Decreased strength;Impaired balance (sitting and/or standing)   OT Treatment/Interventions: Self-care/ADL training;Therapeutic exercise;Neuromuscular education;DME and/or AE instruction;Therapeutic activities;Patient/family education;Visual/perceptual remediation/compensation      OT Goals(Current goals can be found in the care plan section)   Acute Rehab OT Goals Patient Stated Goal: return home OT Goal Formulation: With patient Time For Goal Achievement: 05/03/24 Potential to Achieve Goals: Good   OT Frequency:  Min 1X/week    Co-evaluation PT/OT/SLP Co-Evaluation/Treatment: Yes Reason for Co-Treatment: To address functional/ADL transfers   OT goals addressed during session: ADL's and self-care      AM-PAC OT 6 Clicks Daily Activity     Outcome Measure Help from another person eating meals?: None Help from  another person taking care of personal grooming?: A Little Help from another person toileting, which includes using toliet, bedpan, or urinal?: A Lot Help from another person bathing (including washing, rinsing, drying)?: A Lot Help from another person to put on and taking off regular upper body clothing?: A Little Help from another  person to put on and taking off regular lower body clothing?: A Lot 6 Click Score: 16   End of Session Equipment Utilized During Treatment: Rolling walker (2 wheels)  Activity Tolerance: Patient tolerated treatment well Patient left: in chair;with call bell/phone within reach;with family/visitor present  OT Visit Diagnosis: Unsteadiness on feet (R26.81);Other abnormalities of gait and mobility (R26.89);Muscle weakness (generalized) (M62.81);Other symptoms and signs involving the nervous system (W09.811)                Time: 9147-8295 OT Time Calculation (min): 18 min Charges:  OT General Charges $OT Visit: 1 Visit OT Evaluation $OT Eval Low Complexity: 1 Low  Shandy Vi OT, MOT   Thurnell Floss 04/19/2024, 9:33 AM

## 2024-04-20 ENCOUNTER — Ambulatory Visit: Payer: Self-pay | Admitting: Cardiovascular Disease

## 2024-04-20 ENCOUNTER — Telehealth: Payer: Self-pay

## 2024-04-20 LAB — CUP PACEART REMOTE DEVICE CHECK
Date Time Interrogation Session: 20250616183908
Implantable Pulse Generator Implant Date: 20250411

## 2024-04-20 NOTE — Transitions of Care (Post Inpatient/ED Visit) (Signed)
 04/20/2024  Name: Kara Dean MRN: [id number] DOB: [birth year]  Today's TOC FU Call Status: Today's TOC FU Call Status:: Successful TOC FU Call Completed TOC FU Call Complete Date: 04/20/24 Patient's Name and Date of Birth confirmed.  Transition Care Management Follow-up Telephone Call Date of Discharge: 04/19/24 Discharge [facility] Type of Discharge: Inpatient Admission Primary Inpatient Discharge Diagnosis:: cerebral infarction How have you been since you were released from the hospital?: Same Any questions or concerns?: No  Items Reviewed: Did you receive and understand the discharge instructions provided?: Yes Medications obtained,verified, and reconciled?: Yes (Medications Reviewed) Dietary orders reviewed?: Yes Do you have support at home?: Yes People in Home [RPT]: child(ren), adult  Medications Reviewed Today: Medications Reviewed Today     Reviewed by Darrall Ellison, LPN (Licensed Practical Nurse) on 04/20/24 at 1132  Med List Status: <None>   Medication Order Taking? Sig Documenting Provider Last Dose Status Informant  amLODipine  (NORVASC ) 10 MG tablet [id number] Yes Take 1 tablet (10 mg total) by mouth daily. Delfina Feller, FNP  Active Self, Pharmacy Records  aspirin  81 MG chewable tablet [id number] Yes Chew 1 tablet (81 mg total) by mouth daily. Demaris Fillers, MD  Active Self, Pharmacy Records           Med Note Author Board, ANGELICA G   Sun Apr 18, 2024  3:00 PM) 1000  atorvastatin  (LIPITOR) 20 MG tablet [id number] Yes Take 1 tablet (20 mg total) by mouth daily. Delfina Feller, FNP  Active Self, Pharmacy Records  citalopram  (CELEXA ) 20 MG tablet [id number] Yes Take 1 tablet (20 mg total) by mouth daily. Delfina Feller, FNP  Active Self, Pharmacy Records  fluticasone  (FLONASE ) 50 MCG/ACT nasal spray [id number] Yes USE 2 SPRAYS IN EACH NOSTRIL ONCE DAILY Delfina Feller, FNP  Active Self, Pharmacy Records  furosemide  (LASIX ) 20 MG  tablet [id number] Yes Take 20 mg by mouth daily as needed for fluid or edema. [provider]  Active Self, Pharmacy Records  Insulin  Pen Needle (PIP PEN NEEDLES 32G X ) 32G X 4 MM MISC [id number] Yes UAD Delfina Feller, FNP  Active Self, Pharmacy Records  LANTUS  SOLOSTAR 100 UNIT/ML Solostar Pen [id number] Yes Inject 48 Units into the skin daily. Delfina Feller, FNP  Active Self, Pharmacy Records  LORazepam  (ATIVAN ) 0.5 MG tablet [id number] Yes Take 1 tablet (0.5 mg total) by mouth 2 (two) times daily. Delfina Feller, FNP  Active Self, Pharmacy Records  metoprolol  succinate (TOPROL -XL) 25 MG 24 hr tablet [id number] Yes Take 1 tablet (25 mg total) by mouth daily. Delfina Feller, FNP  Active Self, Pharmacy Records  Multiple Vitamin (MULTIVITAMIN) tablet [id number] Yes Take 1 tablet by mouth daily. [provider]  Active Self, Pharmacy Records  olmesartan  (BENICAR ) 40 MG tablet [id number] Yes Take 1 tablet (40 mg total) by mouth daily. Delfina Feller, FNP  Active Self, Pharmacy Records  [facility] ULTRA test strip [id number] Yes CHECK BLOOD SUGER UP TO 3 TIMES A DAY Gaylyn Keas, Mary-Margaret, FNP  Active Self, Pharmacy Records  pantoprazole  (PROTONIX ) 40 MG tablet [id number] Yes Take 1 tablet (40 mg total) by mouth daily. Mason Sole, Pratik D, DO  Active   potassium chloride  (KLOR-CON ) 10 MEQ tablet [id number] Yes Take 1 tablet (10 mEq total) by mouth daily. Delfina Feller, FNP  Active Self, Pharmacy Records  tolterodine  (DETROL  LA) 4 MG 24 hr capsule [id number] Yes Take 1 capsule (4 mg total) by mouth daily. Delfina Feller, FNP  Active Self, Pharmacy Records  Home Care and Equipment/Supplies: Were Home Health Services Ordered?: Yes Name of Home Health Agency:: [facility] Has Agency set up a time to come to your home?: No Any new equipment or medical supplies ordered?: NA  Functional Questionnaire: Do you need assistance with  bathing/showering or dressing?: Yes Do you need assistance with meal preparation?: Yes Do you need assistance with eating?: No Do you have difficulty maintaining continence: Yes Do you need assistance with getting out of bed/getting out of a chair/moving?: Yes Do you have difficulty managing or taking your medications?: Yes  Follow up appointments reviewed: PCP Follow-up appointment confirmed?: Yes Date of PCP follow-up appointment?: 04/26/24 Follow-up Provider: [facility] Follow-up appointment confirmed?: No Reason Specialist Follow-Up Not Confirmed: Patient has Specialist Provider Number and will Call for Appointment Do you need transportation to your follow-up appointment?: No Do you understand care options if your condition(s) worsen?: Yes-patient verbalized understanding    SIGNATURE Darrall Ellison, LPN [facility] Nurse Health Advisor Direct Dial [phone]

## 2024-04-22 NOTE — Addendum Note (Signed)
 Addended by: Lott Rouleau A on: 04/22/2024 11:37 AM   Modules accepted: Orders

## 2024-04-22 NOTE — Progress Notes (Signed)
 [facility] Summary Report / Loop Recorder

## 2024-04-23 ENCOUNTER — Encounter

## 2024-04-23 DIAGNOSIS — M1990 Unspecified osteoarthritis, unspecified site: Secondary | ICD-10-CM | POA: Diagnosis not present

## 2024-04-23 DIAGNOSIS — E1122 Type 2 diabetes mellitus with diabetic chronic kidney disease: Secondary | ICD-10-CM | POA: Diagnosis not present

## 2024-04-23 DIAGNOSIS — K219 Gastro-esophageal reflux disease without esophagitis: Secondary | ICD-10-CM | POA: Diagnosis not present

## 2024-04-23 DIAGNOSIS — Z8673 Personal history of transient ischemic attack (TIA), and cerebral infarction without residual deficits: Secondary | ICD-10-CM | POA: Diagnosis not present

## 2024-04-23 DIAGNOSIS — Z794 Long term (current) use of insulin: Secondary | ICD-10-CM | POA: Diagnosis not present

## 2024-04-23 DIAGNOSIS — I2510 Atherosclerotic heart disease of native coronary artery without angina pectoris: Secondary | ICD-10-CM | POA: Diagnosis not present

## 2024-04-23 DIAGNOSIS — K5010 Crohn's disease of large intestine without complications: Secondary | ICD-10-CM | POA: Diagnosis not present

## 2024-04-23 DIAGNOSIS — E8809 Other disorders of plasma-protein metabolism, not elsewhere classified: Secondary | ICD-10-CM | POA: Diagnosis not present

## 2024-04-23 DIAGNOSIS — I5032 Chronic diastolic (congestive) heart failure: Secondary | ICD-10-CM | POA: Diagnosis not present

## 2024-04-23 DIAGNOSIS — Z7982 Long term (current) use of aspirin: Secondary | ICD-10-CM | POA: Diagnosis not present

## 2024-04-23 DIAGNOSIS — Z556 Problems related to health literacy: Secondary | ICD-10-CM | POA: Diagnosis not present

## 2024-04-23 DIAGNOSIS — E782 Mixed hyperlipidemia: Secondary | ICD-10-CM | POA: Diagnosis not present

## 2024-04-23 DIAGNOSIS — I700 Atherosclerosis of aorta: Secondary | ICD-10-CM | POA: Diagnosis not present

## 2024-04-23 DIAGNOSIS — H538 Other visual disturbances: Secondary | ICD-10-CM | POA: Diagnosis not present

## 2024-04-23 DIAGNOSIS — N1832 Chronic kidney disease, stage 3b: Secondary | ICD-10-CM | POA: Diagnosis not present

## 2024-04-23 DIAGNOSIS — I088 Other rheumatic multiple valve diseases: Secondary | ICD-10-CM | POA: Diagnosis not present

## 2024-04-23 DIAGNOSIS — E1165 Type 2 diabetes mellitus with hyperglycemia: Secondary | ICD-10-CM | POA: Diagnosis not present

## 2024-04-23 DIAGNOSIS — I130 Hypertensive heart and chronic kidney disease with heart failure and stage 1 through stage 4 chronic kidney disease, or unspecified chronic kidney disease: Secondary | ICD-10-CM | POA: Diagnosis not present

## 2024-04-23 DIAGNOSIS — E46 Unspecified protein-calorie malnutrition: Secondary | ICD-10-CM | POA: Diagnosis not present

## 2024-04-23 DIAGNOSIS — I480 Paroxysmal atrial fibrillation: Secondary | ICD-10-CM | POA: Diagnosis not present

## 2024-04-26 ENCOUNTER — Inpatient Hospital Stay: Admitting: Nurse Practitioner

## 2024-04-27 ENCOUNTER — Ambulatory Visit: Admitting: Nurse Practitioner

## 2024-04-27 ENCOUNTER — Encounter: Payer: Self-pay | Admitting: Nurse Practitioner

## 2024-04-27 VITALS — BP 136/49 | HR 55 | Temp 98.1°F | Ht 61.0 in | Wt 125.0 lb

## 2024-04-27 DIAGNOSIS — H538 Other visual disturbances: Secondary | ICD-10-CM | POA: Diagnosis not present

## 2024-04-27 DIAGNOSIS — Z789 Other specified health status: Secondary | ICD-10-CM

## 2024-04-27 DIAGNOSIS — I5033 Acute on chronic diastolic (congestive) heart failure: Secondary | ICD-10-CM | POA: Diagnosis not present

## 2024-04-27 NOTE — Progress Notes (Signed)
 Subjective:    Patient ID: Kara Dean, female    DOB: 1933-07-10, 89 y.o.   MRN: [id number]   Chief Complaint: hospital follow up  HPI  Today's visit was for Transitional Care Management.  The patient was discharged from [facility] on 04/19/24 with a primary diagnosis of heart failure.   Contact with the patient and/or caregiver, by a clinical staff member, was made on 04/20/24 and was documented as a telephone encounter within the EMR.  Through chart review and discussion with the patient I have determined that management of their condition is of high complexity.      Pateint went to the ED on 04/17/24 dizziness and blurred vision. They were afriad she was having a stroke but workup showed no sign of stroke. Her vision and dizziness returned back to baseline and she was discharged. She was told  to continue all meds including aspirin . Follow up with PCP, eye doctor and neurology. Since going home she has felt fine. No dizziness or blurred vision. Just feels tired.   Patient Active Problem List   Diagnosis Date Noted   Type 2 diabetes mellitus with hyperglycemia (HCC) 04/18/2024   Hypoalbuminemia due to protein-calorie malnutrition (HCC) 04/18/2024   CAD (coronary artery disease) 04/18/2024   Blurred vision 04/18/2024   Valvular heart disease 11/18/2023   Chronic diastolic CHF (congestive heart failure) (HCC) 11/18/2023   Acute ischemic stroke (HCC) 10/19/2023   Right pontine stroke (HCC) 10/18/2023   Choledocholithiasis with acute cholecystitis 05/07/2021   Peripheral edema 03/18/2017   Chronic kidney disease, stage 3b (HCC) 07/27/2014   Gastroesophageal reflux disease without esophagitis 04/27/2014   GAD (generalized anxiety disorder) 04/27/2014   History of CVA (cerebrovascular accident) 03/26/2010   Mixed hyperlipidemia 02/01/2009   Depression 02/01/2009   Essential hypertension 02/01/2009   Osteoarthritis 02/01/2009   Diabetes mellitus with insulin  therapy (HCC)  02/01/2009       Review of Systems  Constitutional:  Negative for diaphoresis.  Eyes:  Positive for visual disturbance. Negative for pain.  Respiratory:  Negative for shortness of breath.   Cardiovascular:  Negative for chest pain, palpitations and leg swelling.  Gastrointestinal:  Negative for abdominal pain.  Endocrine: Negative for polydipsia.  Skin:  Negative for rash.  Neurological:  Negative for dizziness, weakness and headaches.  Hematological:  Does not bruise/bleed easily.  All other systems reviewed and are negative.      Objective:   Physical Exam Constitutional:      Appearance: Normal appearance.   Cardiovascular:     Rate and Rhythm: Normal rate and regular rhythm.     Heart sounds: Normal heart sounds.  Pulmonary:     Breath sounds: Rales (bil lower ext) present.   Skin:    General: Skin is warm.   Neurological:     General: No focal deficit present.     Mental Status: She is alert and oriented to person, place, and time.   Psychiatric:        Mood and Affect: Mood normal.        Behavior: Behavior normal.     BP (!) 136/49   Pulse (!) 55   Temp 98.1 F (36.7 C) (Temporal)   Ht 5' 1 (1.549 m)   Wt 125 lb (56.7 kg)   SpO2 97%   BMI 23.62 kg/m        Assessment & Plan:   Kara Dean in today with chief complaint of Transitions Of Care   1.  Transition of care (Primary) Hospital records reviewed   2. Acute on chronic diastolic heart failure (HCC) Make sure has follow up with neurology and eye doctor Limit fluid intake Elevate legs when sitting RTO as scheduled    The above assessment and management plan was discussed with the patient. The patient verbalized understanding of and has agreed to the management plan. Patient is aware to call the clinic if symptoms persist or worsen. Patient is aware when to return to the clinic for a follow-up visit. Patient educated on when it is appropriate to go to the emergency department.    Kara Gladis, FNP

## 2024-04-27 NOTE — Patient Instructions (Signed)
 Heart Failure Action Plan A heart failure action plan helps you know what to do when you have symptoms of heart failure. Your action plan is a color-coded plan that lists the symptoms to watch for and indicates what actions to take. If you have symptoms in the green zone, you're doing well. If you have symptoms in the yellow zone, you're having problems. If you have symptoms in the red zone, you need medical care right away. Follow the plan that was created by you and your health care provider. Review your plan each time you visit your provider. Green zone These signs mean you're doing well and can continue what you're doing: You don't have new or worsening shortness of breath. You have very little swelling or no new swelling. Your weight is stable (no gain or loss). You have a normal activity level. You don't have chest pain or any other new symptoms. Yellow zone These signs and symptoms mean your condition may be getting worse and you should make some changes: You have trouble breathing when you're active. You have swelling in your feet or legs or have discomfort in your belly. You gain 2-3 lb (0.9-1.4 kg) in 24 hours, or 5 lb (2.3 kg) in a week. This amount may be more or less depending on your condition. You get tired easily. You have trouble sleeping. You have a dry cough. If you have any of these symptoms: Contact your provider within the next day. Your provider may adjust your medicines. Red zone These signs and symptoms mean you should get medical help right away: You have trouble breathing when resting or cannot lie flat and you need to raise your head to help you breathe. You have a dry cough that's getting worse. You have swelling or pain in your feet or legs or discomfort in your belly that's getting worse. You suddenly gain more than 2-3 lb (0.9-1.4 kg) in 24 hours, or more than 5 lb (2.3 kg) in a week. This amount may be more or less depending on your condition. You have  trouble staying awake or you feel confused. You don't have an appetite. You have worsening sadness or depression. These symptoms may be an emergency. Call 911 right away. Do not wait to see if the symptoms will go away. Do not drive yourself to the hospital. Follow these instructions at home: Take medicines only as told. Eat a heart-healthy diet. Work with a dietitian to create an eating plan that's best for you. Weigh yourself each day. Your target weight is __________ lb (__________ kg). Call your provider if you gain more than __________ lb (__________ kg) in 24 hours, or more than __________ lb (__________ kg) in a week. Health care provider name: _____________________________________________________ Health care provider phone number: _____________________________________________________ Where to find more information American Heart Association: heart.org This information is not intended to replace advice given to you by your health care provider. Make sure you discuss any questions you have with your health care provider. Document Revised: 06/05/2023 Document Reviewed: 06/05/2023 Elsevier Patient Education  2024 [employer].

## 2024-04-30 DIAGNOSIS — I2510 Atherosclerotic heart disease of native coronary artery without angina pectoris: Secondary | ICD-10-CM | POA: Diagnosis not present

## 2024-04-30 DIAGNOSIS — Z794 Long term (current) use of insulin: Secondary | ICD-10-CM | POA: Diagnosis not present

## 2024-04-30 DIAGNOSIS — K5010 Crohn's disease of large intestine without complications: Secondary | ICD-10-CM | POA: Diagnosis not present

## 2024-04-30 DIAGNOSIS — Z7982 Long term (current) use of aspirin: Secondary | ICD-10-CM | POA: Diagnosis not present

## 2024-04-30 DIAGNOSIS — E1122 Type 2 diabetes mellitus with diabetic chronic kidney disease: Secondary | ICD-10-CM | POA: Diagnosis not present

## 2024-04-30 DIAGNOSIS — Z556 Problems related to health literacy: Secondary | ICD-10-CM | POA: Diagnosis not present

## 2024-04-30 DIAGNOSIS — I130 Hypertensive heart and chronic kidney disease with heart failure and stage 1 through stage 4 chronic kidney disease, or unspecified chronic kidney disease: Secondary | ICD-10-CM | POA: Diagnosis not present

## 2024-04-30 DIAGNOSIS — H538 Other visual disturbances: Secondary | ICD-10-CM | POA: Diagnosis not present

## 2024-04-30 DIAGNOSIS — E1165 Type 2 diabetes mellitus with hyperglycemia: Secondary | ICD-10-CM | POA: Diagnosis not present

## 2024-04-30 DIAGNOSIS — E782 Mixed hyperlipidemia: Secondary | ICD-10-CM | POA: Diagnosis not present

## 2024-04-30 DIAGNOSIS — M1990 Unspecified osteoarthritis, unspecified site: Secondary | ICD-10-CM | POA: Diagnosis not present

## 2024-04-30 DIAGNOSIS — N1832 Chronic kidney disease, stage 3b: Secondary | ICD-10-CM | POA: Diagnosis not present

## 2024-04-30 DIAGNOSIS — E46 Unspecified protein-calorie malnutrition: Secondary | ICD-10-CM | POA: Diagnosis not present

## 2024-04-30 DIAGNOSIS — I5032 Chronic diastolic (congestive) heart failure: Secondary | ICD-10-CM | POA: Diagnosis not present

## 2024-04-30 DIAGNOSIS — K219 Gastro-esophageal reflux disease without esophagitis: Secondary | ICD-10-CM | POA: Diagnosis not present

## 2024-04-30 DIAGNOSIS — I480 Paroxysmal atrial fibrillation: Secondary | ICD-10-CM | POA: Diagnosis not present

## 2024-04-30 DIAGNOSIS — E8809 Other disorders of plasma-protein metabolism, not elsewhere classified: Secondary | ICD-10-CM | POA: Diagnosis not present

## 2024-04-30 DIAGNOSIS — I700 Atherosclerosis of aorta: Secondary | ICD-10-CM | POA: Diagnosis not present

## 2024-04-30 DIAGNOSIS — Z8673 Personal history of transient ischemic attack (TIA), and cerebral infarction without residual deficits: Secondary | ICD-10-CM | POA: Diagnosis not present

## 2024-04-30 DIAGNOSIS — I088 Other rheumatic multiple valve diseases: Secondary | ICD-10-CM | POA: Diagnosis not present

## 2024-05-04 DIAGNOSIS — I700 Atherosclerosis of aorta: Secondary | ICD-10-CM | POA: Diagnosis not present

## 2024-05-04 DIAGNOSIS — Z7982 Long term (current) use of aspirin: Secondary | ICD-10-CM | POA: Diagnosis not present

## 2024-05-04 DIAGNOSIS — K219 Gastro-esophageal reflux disease without esophagitis: Secondary | ICD-10-CM | POA: Diagnosis not present

## 2024-05-04 DIAGNOSIS — Z556 Problems related to health literacy: Secondary | ICD-10-CM | POA: Diagnosis not present

## 2024-05-04 DIAGNOSIS — E8809 Other disorders of plasma-protein metabolism, not elsewhere classified: Secondary | ICD-10-CM | POA: Diagnosis not present

## 2024-05-04 DIAGNOSIS — Z794 Long term (current) use of insulin: Secondary | ICD-10-CM | POA: Diagnosis not present

## 2024-05-04 DIAGNOSIS — E1165 Type 2 diabetes mellitus with hyperglycemia: Secondary | ICD-10-CM | POA: Diagnosis not present

## 2024-05-04 DIAGNOSIS — Z8673 Personal history of transient ischemic attack (TIA), and cerebral infarction without residual deficits: Secondary | ICD-10-CM | POA: Diagnosis not present

## 2024-05-04 DIAGNOSIS — K5010 Crohn's disease of large intestine without complications: Secondary | ICD-10-CM | POA: Diagnosis not present

## 2024-05-04 DIAGNOSIS — E46 Unspecified protein-calorie malnutrition: Secondary | ICD-10-CM | POA: Diagnosis not present

## 2024-05-04 DIAGNOSIS — I130 Hypertensive heart and chronic kidney disease with heart failure and stage 1 through stage 4 chronic kidney disease, or unspecified chronic kidney disease: Secondary | ICD-10-CM | POA: Diagnosis not present

## 2024-05-04 DIAGNOSIS — E1122 Type 2 diabetes mellitus with diabetic chronic kidney disease: Secondary | ICD-10-CM | POA: Diagnosis not present

## 2024-05-04 DIAGNOSIS — I088 Other rheumatic multiple valve diseases: Secondary | ICD-10-CM | POA: Diagnosis not present

## 2024-05-04 DIAGNOSIS — M1990 Unspecified osteoarthritis, unspecified site: Secondary | ICD-10-CM | POA: Diagnosis not present

## 2024-05-04 DIAGNOSIS — H538 Other visual disturbances: Secondary | ICD-10-CM | POA: Diagnosis not present

## 2024-05-04 DIAGNOSIS — N1832 Chronic kidney disease, stage 3b: Secondary | ICD-10-CM | POA: Diagnosis not present

## 2024-05-04 DIAGNOSIS — I480 Paroxysmal atrial fibrillation: Secondary | ICD-10-CM | POA: Diagnosis not present

## 2024-05-04 DIAGNOSIS — I2510 Atherosclerotic heart disease of native coronary artery without angina pectoris: Secondary | ICD-10-CM | POA: Diagnosis not present

## 2024-05-04 DIAGNOSIS — E782 Mixed hyperlipidemia: Secondary | ICD-10-CM | POA: Diagnosis not present

## 2024-05-04 DIAGNOSIS — I5032 Chronic diastolic (congestive) heart failure: Secondary | ICD-10-CM | POA: Diagnosis not present

## 2024-05-06 DIAGNOSIS — E1165 Type 2 diabetes mellitus with hyperglycemia: Secondary | ICD-10-CM | POA: Diagnosis not present

## 2024-05-06 DIAGNOSIS — I480 Paroxysmal atrial fibrillation: Secondary | ICD-10-CM | POA: Diagnosis not present

## 2024-05-06 DIAGNOSIS — I130 Hypertensive heart and chronic kidney disease with heart failure and stage 1 through stage 4 chronic kidney disease, or unspecified chronic kidney disease: Secondary | ICD-10-CM | POA: Diagnosis not present

## 2024-05-06 DIAGNOSIS — E1122 Type 2 diabetes mellitus with diabetic chronic kidney disease: Secondary | ICD-10-CM | POA: Diagnosis not present

## 2024-05-06 DIAGNOSIS — I700 Atherosclerosis of aorta: Secondary | ICD-10-CM | POA: Diagnosis not present

## 2024-05-06 DIAGNOSIS — H538 Other visual disturbances: Secondary | ICD-10-CM | POA: Diagnosis not present

## 2024-05-06 DIAGNOSIS — I2510 Atherosclerotic heart disease of native coronary artery without angina pectoris: Secondary | ICD-10-CM | POA: Diagnosis not present

## 2024-05-06 DIAGNOSIS — Z556 Problems related to health literacy: Secondary | ICD-10-CM | POA: Diagnosis not present

## 2024-05-06 DIAGNOSIS — N1832 Chronic kidney disease, stage 3b: Secondary | ICD-10-CM | POA: Diagnosis not present

## 2024-05-06 DIAGNOSIS — Z8673 Personal history of transient ischemic attack (TIA), and cerebral infarction without residual deficits: Secondary | ICD-10-CM | POA: Diagnosis not present

## 2024-05-06 DIAGNOSIS — K5010 Crohn's disease of large intestine without complications: Secondary | ICD-10-CM | POA: Diagnosis not present

## 2024-05-06 DIAGNOSIS — E8809 Other disorders of plasma-protein metabolism, not elsewhere classified: Secondary | ICD-10-CM | POA: Diagnosis not present

## 2024-05-06 DIAGNOSIS — Z794 Long term (current) use of insulin: Secondary | ICD-10-CM | POA: Diagnosis not present

## 2024-05-06 DIAGNOSIS — E782 Mixed hyperlipidemia: Secondary | ICD-10-CM | POA: Diagnosis not present

## 2024-05-06 DIAGNOSIS — K219 Gastro-esophageal reflux disease without esophagitis: Secondary | ICD-10-CM | POA: Diagnosis not present

## 2024-05-06 DIAGNOSIS — M1990 Unspecified osteoarthritis, unspecified site: Secondary | ICD-10-CM | POA: Diagnosis not present

## 2024-05-06 DIAGNOSIS — I088 Other rheumatic multiple valve diseases: Secondary | ICD-10-CM | POA: Diagnosis not present

## 2024-05-06 DIAGNOSIS — E46 Unspecified protein-calorie malnutrition: Secondary | ICD-10-CM | POA: Diagnosis not present

## 2024-05-06 DIAGNOSIS — Z7982 Long term (current) use of aspirin: Secondary | ICD-10-CM | POA: Diagnosis not present

## 2024-05-06 DIAGNOSIS — I5032 Chronic diastolic (congestive) heart failure: Secondary | ICD-10-CM | POA: Diagnosis not present

## 2024-05-11 DIAGNOSIS — E8809 Other disorders of plasma-protein metabolism, not elsewhere classified: Secondary | ICD-10-CM | POA: Diagnosis not present

## 2024-05-11 DIAGNOSIS — E782 Mixed hyperlipidemia: Secondary | ICD-10-CM | POA: Diagnosis not present

## 2024-05-11 DIAGNOSIS — M79674 Pain in right toe(s): Secondary | ICD-10-CM | POA: Diagnosis not present

## 2024-05-11 DIAGNOSIS — Z556 Problems related to health literacy: Secondary | ICD-10-CM | POA: Diagnosis not present

## 2024-05-11 DIAGNOSIS — E1122 Type 2 diabetes mellitus with diabetic chronic kidney disease: Secondary | ICD-10-CM | POA: Diagnosis not present

## 2024-05-11 DIAGNOSIS — B351 Tinea unguium: Secondary | ICD-10-CM | POA: Diagnosis not present

## 2024-05-11 DIAGNOSIS — I700 Atherosclerosis of aorta: Secondary | ICD-10-CM | POA: Diagnosis not present

## 2024-05-11 DIAGNOSIS — I5032 Chronic diastolic (congestive) heart failure: Secondary | ICD-10-CM | POA: Diagnosis not present

## 2024-05-11 DIAGNOSIS — E46 Unspecified protein-calorie malnutrition: Secondary | ICD-10-CM | POA: Diagnosis not present

## 2024-05-11 DIAGNOSIS — E1165 Type 2 diabetes mellitus with hyperglycemia: Secondary | ICD-10-CM | POA: Diagnosis not present

## 2024-05-11 DIAGNOSIS — Z8673 Personal history of transient ischemic attack (TIA), and cerebral infarction without residual deficits: Secondary | ICD-10-CM | POA: Diagnosis not present

## 2024-05-11 DIAGNOSIS — H538 Other visual disturbances: Secondary | ICD-10-CM | POA: Diagnosis not present

## 2024-05-11 DIAGNOSIS — M79675 Pain in left toe(s): Secondary | ICD-10-CM | POA: Diagnosis not present

## 2024-05-11 DIAGNOSIS — I088 Other rheumatic multiple valve diseases: Secondary | ICD-10-CM | POA: Diagnosis not present

## 2024-05-11 DIAGNOSIS — N1832 Chronic kidney disease, stage 3b: Secondary | ICD-10-CM | POA: Diagnosis not present

## 2024-05-11 DIAGNOSIS — Z7982 Long term (current) use of aspirin: Secondary | ICD-10-CM | POA: Diagnosis not present

## 2024-05-11 DIAGNOSIS — L84 Corns and callosities: Secondary | ICD-10-CM | POA: Diagnosis not present

## 2024-05-11 DIAGNOSIS — I130 Hypertensive heart and chronic kidney disease with heart failure and stage 1 through stage 4 chronic kidney disease, or unspecified chronic kidney disease: Secondary | ICD-10-CM | POA: Diagnosis not present

## 2024-05-11 DIAGNOSIS — K219 Gastro-esophageal reflux disease without esophagitis: Secondary | ICD-10-CM | POA: Diagnosis not present

## 2024-05-11 DIAGNOSIS — Z794 Long term (current) use of insulin: Secondary | ICD-10-CM | POA: Diagnosis not present

## 2024-05-11 DIAGNOSIS — I480 Paroxysmal atrial fibrillation: Secondary | ICD-10-CM | POA: Diagnosis not present

## 2024-05-11 DIAGNOSIS — K5010 Crohn's disease of large intestine without complications: Secondary | ICD-10-CM | POA: Diagnosis not present

## 2024-05-11 DIAGNOSIS — M1990 Unspecified osteoarthritis, unspecified site: Secondary | ICD-10-CM | POA: Diagnosis not present

## 2024-05-11 DIAGNOSIS — I2510 Atherosclerotic heart disease of native coronary artery without angina pectoris: Secondary | ICD-10-CM | POA: Diagnosis not present

## 2024-05-11 DIAGNOSIS — I70203 Unspecified atherosclerosis of native arteries of extremities, bilateral legs: Secondary | ICD-10-CM | POA: Diagnosis not present

## 2024-05-14 ENCOUNTER — Other Ambulatory Visit: Payer: Self-pay | Admitting: Cardiovascular Disease

## 2024-05-14 DIAGNOSIS — H538 Other visual disturbances: Secondary | ICD-10-CM | POA: Diagnosis not present

## 2024-05-14 DIAGNOSIS — E782 Mixed hyperlipidemia: Secondary | ICD-10-CM | POA: Diagnosis not present

## 2024-05-14 DIAGNOSIS — M1990 Unspecified osteoarthritis, unspecified site: Secondary | ICD-10-CM | POA: Diagnosis not present

## 2024-05-14 DIAGNOSIS — Z794 Long term (current) use of insulin: Secondary | ICD-10-CM | POA: Diagnosis not present

## 2024-05-14 DIAGNOSIS — K5010 Crohn's disease of large intestine without complications: Secondary | ICD-10-CM | POA: Diagnosis not present

## 2024-05-14 DIAGNOSIS — Z556 Problems related to health literacy: Secondary | ICD-10-CM | POA: Diagnosis not present

## 2024-05-14 DIAGNOSIS — E1122 Type 2 diabetes mellitus with diabetic chronic kidney disease: Secondary | ICD-10-CM | POA: Diagnosis not present

## 2024-05-14 DIAGNOSIS — I130 Hypertensive heart and chronic kidney disease with heart failure and stage 1 through stage 4 chronic kidney disease, or unspecified chronic kidney disease: Secondary | ICD-10-CM | POA: Diagnosis not present

## 2024-05-14 DIAGNOSIS — Z7982 Long term (current) use of aspirin: Secondary | ICD-10-CM | POA: Diagnosis not present

## 2024-05-14 DIAGNOSIS — I480 Paroxysmal atrial fibrillation: Secondary | ICD-10-CM | POA: Diagnosis not present

## 2024-05-14 DIAGNOSIS — N1832 Chronic kidney disease, stage 3b: Secondary | ICD-10-CM | POA: Diagnosis not present

## 2024-05-14 DIAGNOSIS — Z8673 Personal history of transient ischemic attack (TIA), and cerebral infarction without residual deficits: Secondary | ICD-10-CM | POA: Diagnosis not present

## 2024-05-14 DIAGNOSIS — E46 Unspecified protein-calorie malnutrition: Secondary | ICD-10-CM | POA: Diagnosis not present

## 2024-05-14 DIAGNOSIS — E8809 Other disorders of plasma-protein metabolism, not elsewhere classified: Secondary | ICD-10-CM | POA: Diagnosis not present

## 2024-05-14 DIAGNOSIS — K219 Gastro-esophageal reflux disease without esophagitis: Secondary | ICD-10-CM | POA: Diagnosis not present

## 2024-05-14 DIAGNOSIS — E1165 Type 2 diabetes mellitus with hyperglycemia: Secondary | ICD-10-CM | POA: Diagnosis not present

## 2024-05-14 DIAGNOSIS — I700 Atherosclerosis of aorta: Secondary | ICD-10-CM | POA: Diagnosis not present

## 2024-05-14 DIAGNOSIS — I5032 Chronic diastolic (congestive) heart failure: Secondary | ICD-10-CM | POA: Diagnosis not present

## 2024-05-14 DIAGNOSIS — I088 Other rheumatic multiple valve diseases: Secondary | ICD-10-CM | POA: Diagnosis not present

## 2024-05-14 DIAGNOSIS — I2510 Atherosclerotic heart disease of native coronary artery without angina pectoris: Secondary | ICD-10-CM | POA: Diagnosis not present

## 2024-05-20 ENCOUNTER — Ambulatory Visit (INDEPENDENT_AMBULATORY_CARE_PROVIDER_SITE_OTHER)

## 2024-05-20 DIAGNOSIS — E782 Mixed hyperlipidemia: Secondary | ICD-10-CM | POA: Diagnosis not present

## 2024-05-20 DIAGNOSIS — I700 Atherosclerosis of aorta: Secondary | ICD-10-CM | POA: Diagnosis not present

## 2024-05-20 DIAGNOSIS — Z556 Problems related to health literacy: Secondary | ICD-10-CM | POA: Diagnosis not present

## 2024-05-20 DIAGNOSIS — N1832 Chronic kidney disease, stage 3b: Secondary | ICD-10-CM | POA: Diagnosis not present

## 2024-05-20 DIAGNOSIS — I2510 Atherosclerotic heart disease of native coronary artery without angina pectoris: Secondary | ICD-10-CM | POA: Diagnosis not present

## 2024-05-20 DIAGNOSIS — I088 Other rheumatic multiple valve diseases: Secondary | ICD-10-CM | POA: Diagnosis not present

## 2024-05-20 DIAGNOSIS — Z8673 Personal history of transient ischemic attack (TIA), and cerebral infarction without residual deficits: Secondary | ICD-10-CM | POA: Diagnosis not present

## 2024-05-20 DIAGNOSIS — E8809 Other disorders of plasma-protein metabolism, not elsewhere classified: Secondary | ICD-10-CM | POA: Diagnosis not present

## 2024-05-20 DIAGNOSIS — I639 Cerebral infarction, unspecified: Secondary | ICD-10-CM | POA: Diagnosis not present

## 2024-05-20 DIAGNOSIS — H538 Other visual disturbances: Secondary | ICD-10-CM | POA: Diagnosis not present

## 2024-05-20 DIAGNOSIS — I480 Paroxysmal atrial fibrillation: Secondary | ICD-10-CM | POA: Diagnosis not present

## 2024-05-20 DIAGNOSIS — K5010 Crohn's disease of large intestine without complications: Secondary | ICD-10-CM | POA: Diagnosis not present

## 2024-05-20 DIAGNOSIS — I5032 Chronic diastolic (congestive) heart failure: Secondary | ICD-10-CM | POA: Diagnosis not present

## 2024-05-20 DIAGNOSIS — Z7982 Long term (current) use of aspirin: Secondary | ICD-10-CM | POA: Diagnosis not present

## 2024-05-20 DIAGNOSIS — E1165 Type 2 diabetes mellitus with hyperglycemia: Secondary | ICD-10-CM | POA: Diagnosis not present

## 2024-05-20 DIAGNOSIS — M1990 Unspecified osteoarthritis, unspecified site: Secondary | ICD-10-CM | POA: Diagnosis not present

## 2024-05-20 DIAGNOSIS — E1122 Type 2 diabetes mellitus with diabetic chronic kidney disease: Secondary | ICD-10-CM | POA: Diagnosis not present

## 2024-05-20 DIAGNOSIS — E46 Unspecified protein-calorie malnutrition: Secondary | ICD-10-CM | POA: Diagnosis not present

## 2024-05-20 DIAGNOSIS — I130 Hypertensive heart and chronic kidney disease with heart failure and stage 1 through stage 4 chronic kidney disease, or unspecified chronic kidney disease: Secondary | ICD-10-CM | POA: Diagnosis not present

## 2024-05-20 DIAGNOSIS — Z794 Long term (current) use of insulin: Secondary | ICD-10-CM | POA: Diagnosis not present

## 2024-05-20 DIAGNOSIS — K219 Gastro-esophageal reflux disease without esophagitis: Secondary | ICD-10-CM | POA: Diagnosis not present

## 2024-05-21 LAB — CUP PACEART REMOTE DEVICE CHECK
Date Time Interrogation Session: 20250717183913
Implantable Pulse Generator Implant Date: 20250411

## 2024-05-25 ENCOUNTER — Telehealth: Payer: Self-pay

## 2024-05-25 DIAGNOSIS — I5032 Chronic diastolic (congestive) heart failure: Secondary | ICD-10-CM | POA: Diagnosis not present

## 2024-05-25 DIAGNOSIS — Z794 Long term (current) use of insulin: Secondary | ICD-10-CM | POA: Diagnosis not present

## 2024-05-25 DIAGNOSIS — E1122 Type 2 diabetes mellitus with diabetic chronic kidney disease: Secondary | ICD-10-CM | POA: Diagnosis not present

## 2024-05-25 DIAGNOSIS — I130 Hypertensive heart and chronic kidney disease with heart failure and stage 1 through stage 4 chronic kidney disease, or unspecified chronic kidney disease: Secondary | ICD-10-CM | POA: Diagnosis not present

## 2024-05-25 DIAGNOSIS — I700 Atherosclerosis of aorta: Secondary | ICD-10-CM | POA: Diagnosis not present

## 2024-05-25 DIAGNOSIS — I480 Paroxysmal atrial fibrillation: Secondary | ICD-10-CM | POA: Diagnosis not present

## 2024-05-25 DIAGNOSIS — Z7982 Long term (current) use of aspirin: Secondary | ICD-10-CM | POA: Diagnosis not present

## 2024-05-25 DIAGNOSIS — H538 Other visual disturbances: Secondary | ICD-10-CM | POA: Diagnosis not present

## 2024-05-25 DIAGNOSIS — N1832 Chronic kidney disease, stage 3b: Secondary | ICD-10-CM | POA: Diagnosis not present

## 2024-05-25 DIAGNOSIS — E8809 Other disorders of plasma-protein metabolism, not elsewhere classified: Secondary | ICD-10-CM | POA: Diagnosis not present

## 2024-05-25 DIAGNOSIS — K219 Gastro-esophageal reflux disease without esophagitis: Secondary | ICD-10-CM | POA: Diagnosis not present

## 2024-05-25 DIAGNOSIS — Z556 Problems related to health literacy: Secondary | ICD-10-CM | POA: Diagnosis not present

## 2024-05-25 DIAGNOSIS — I088 Other rheumatic multiple valve diseases: Secondary | ICD-10-CM | POA: Diagnosis not present

## 2024-05-25 DIAGNOSIS — E782 Mixed hyperlipidemia: Secondary | ICD-10-CM | POA: Diagnosis not present

## 2024-05-25 DIAGNOSIS — E46 Unspecified protein-calorie malnutrition: Secondary | ICD-10-CM | POA: Diagnosis not present

## 2024-05-25 DIAGNOSIS — I2510 Atherosclerotic heart disease of native coronary artery without angina pectoris: Secondary | ICD-10-CM | POA: Diagnosis not present

## 2024-05-25 DIAGNOSIS — E1165 Type 2 diabetes mellitus with hyperglycemia: Secondary | ICD-10-CM | POA: Diagnosis not present

## 2024-05-25 DIAGNOSIS — Z8673 Personal history of transient ischemic attack (TIA), and cerebral infarction without residual deficits: Secondary | ICD-10-CM | POA: Diagnosis not present

## 2024-05-25 DIAGNOSIS — K5010 Crohn's disease of large intestine without complications: Secondary | ICD-10-CM | POA: Diagnosis not present

## 2024-05-25 DIAGNOSIS — M1990 Unspecified osteoarthritis, unspecified site: Secondary | ICD-10-CM | POA: Diagnosis not present

## 2024-05-25 MED ORDER — BLOOD GLUCOSE MONITORING SUPPL DEVI
0 refills | Status: AC
Start: 2024-05-25 — End: ?

## 2024-05-25 MED ORDER — LANCETS MISC. MISC
3 refills | Status: AC
Start: 2024-05-25 — End: ?

## 2024-05-25 MED ORDER — BLOOD GLUCOSE TEST VI STRP: ORAL_STRIP | 3 refills | Status: AC

## 2024-05-25 MED ORDER — LANCET DEVICE MISC: 0 refills | Status: AC

## 2024-05-25 NOTE — Addendum Note (Signed)
 Addended by: JOLINDA NORENE HERO on: 05/25/2024 12:23 PM   Modules accepted: Orders

## 2024-05-25 NOTE — Telephone Encounter (Signed)
Pt notified.    LS

## 2024-05-25 NOTE — Telephone Encounter (Signed)
 Rx sent  Meds ordered this encounter  Medications   Blood Glucose Monitoring Suppl DEVI    Sig: Check BGs 3 times daily. E11.9 May substitute to any manufacturer covered by patient's insurance.    Dispense:  1 each    Refill:  0   Glucose Blood (BLOOD GLUCOSE TEST STRIPS) STRP    Sig: Check BGs 3 times daily. E11.9 May substitute to any manufacturer covered by patient's insurance.    Dispense:  300 strip    Refill:  3   Lancet Device MISC    Sig: Check BGs 3 times daily. E11.9 May substitute to any manufacturer covered by patient's insurance.    Dispense:  1 each    Refill:  0   Lancets Misc. MISC    Sig: Check BGs 3 times daily. E11.9 May substitute to any manufacturer covered by patient's insurance.    Dispense:  300 each    Refill:  3

## 2024-05-25 NOTE — Telephone Encounter (Signed)
 Copied from CRM [id number]. Topic: Clinical - Medication Question >> May 25, 2024 10:59 AM Myrick T wrote: Reason for CRM: patients daughter Winton called stated they recv a letter from [facility] saying patient needs a new One Touch Diabetes monitor, test strips and needles. Winton is requesting an order sent in for the supplies.

## 2024-05-28 ENCOUNTER — Other Ambulatory Visit: Payer: Self-pay | Admitting: Nurse Practitioner

## 2024-05-28 ENCOUNTER — Encounter

## 2024-05-28 NOTE — Progress Notes (Signed)
 [facility] Summary Report / Loop Recorder

## 2024-05-28 NOTE — Addendum Note (Signed)
 Addended by: TAWNI DRILLING D on: 05/28/2024 09:55 AM   Modules accepted: Orders

## 2024-05-29 ENCOUNTER — Ambulatory Visit: Payer: Self-pay | Admitting: Cardiovascular Disease

## 2024-06-01 DIAGNOSIS — Z7982 Long term (current) use of aspirin: Secondary | ICD-10-CM | POA: Diagnosis not present

## 2024-06-01 DIAGNOSIS — I130 Hypertensive heart and chronic kidney disease with heart failure and stage 1 through stage 4 chronic kidney disease, or unspecified chronic kidney disease: Secondary | ICD-10-CM | POA: Diagnosis not present

## 2024-06-01 DIAGNOSIS — H538 Other visual disturbances: Secondary | ICD-10-CM | POA: Diagnosis not present

## 2024-06-01 DIAGNOSIS — E782 Mixed hyperlipidemia: Secondary | ICD-10-CM | POA: Diagnosis not present

## 2024-06-01 DIAGNOSIS — E1165 Type 2 diabetes mellitus with hyperglycemia: Secondary | ICD-10-CM | POA: Diagnosis not present

## 2024-06-01 DIAGNOSIS — E1122 Type 2 diabetes mellitus with diabetic chronic kidney disease: Secondary | ICD-10-CM | POA: Diagnosis not present

## 2024-06-01 DIAGNOSIS — I5032 Chronic diastolic (congestive) heart failure: Secondary | ICD-10-CM | POA: Diagnosis not present

## 2024-06-01 DIAGNOSIS — I088 Other rheumatic multiple valve diseases: Secondary | ICD-10-CM | POA: Diagnosis not present

## 2024-06-01 DIAGNOSIS — Z8673 Personal history of transient ischemic attack (TIA), and cerebral infarction without residual deficits: Secondary | ICD-10-CM | POA: Diagnosis not present

## 2024-06-01 DIAGNOSIS — E8809 Other disorders of plasma-protein metabolism, not elsewhere classified: Secondary | ICD-10-CM | POA: Diagnosis not present

## 2024-06-01 DIAGNOSIS — Z794 Long term (current) use of insulin: Secondary | ICD-10-CM | POA: Diagnosis not present

## 2024-06-01 DIAGNOSIS — I700 Atherosclerosis of aorta: Secondary | ICD-10-CM | POA: Diagnosis not present

## 2024-06-01 DIAGNOSIS — I480 Paroxysmal atrial fibrillation: Secondary | ICD-10-CM | POA: Diagnosis not present

## 2024-06-01 DIAGNOSIS — Z556 Problems related to health literacy: Secondary | ICD-10-CM | POA: Diagnosis not present

## 2024-06-01 DIAGNOSIS — K5010 Crohn's disease of large intestine without complications: Secondary | ICD-10-CM | POA: Diagnosis not present

## 2024-06-01 DIAGNOSIS — I2510 Atherosclerotic heart disease of native coronary artery without angina pectoris: Secondary | ICD-10-CM | POA: Diagnosis not present

## 2024-06-01 DIAGNOSIS — E46 Unspecified protein-calorie malnutrition: Secondary | ICD-10-CM | POA: Diagnosis not present

## 2024-06-01 DIAGNOSIS — M1990 Unspecified osteoarthritis, unspecified site: Secondary | ICD-10-CM | POA: Diagnosis not present

## 2024-06-01 DIAGNOSIS — K219 Gastro-esophageal reflux disease without esophagitis: Secondary | ICD-10-CM | POA: Diagnosis not present

## 2024-06-01 DIAGNOSIS — N1832 Chronic kidney disease, stage 3b: Secondary | ICD-10-CM | POA: Diagnosis not present

## 2024-06-02 ENCOUNTER — Other Ambulatory Visit: Payer: Self-pay | Admitting: Nurse Practitioner

## 2024-06-02 DIAGNOSIS — I10 Essential (primary) hypertension: Secondary | ICD-10-CM

## 2024-06-02 DIAGNOSIS — K219 Gastro-esophageal reflux disease without esophagitis: Secondary | ICD-10-CM

## 2024-06-08 ENCOUNTER — Encounter: Payer: Self-pay | Admitting: Nurse Practitioner

## 2024-06-08 ENCOUNTER — Ambulatory Visit: Admitting: Nurse Practitioner

## 2024-06-08 VITALS — BP 154/62 | HR 73 | Temp 97.7°F | Ht 61.0 in | Wt 120.0 lb

## 2024-06-08 DIAGNOSIS — E1169 Type 2 diabetes mellitus with other specified complication: Secondary | ICD-10-CM

## 2024-06-08 DIAGNOSIS — E782 Mixed hyperlipidemia: Secondary | ICD-10-CM

## 2024-06-08 DIAGNOSIS — F3342 Major depressive disorder, recurrent, in full remission: Secondary | ICD-10-CM

## 2024-06-08 DIAGNOSIS — N1832 Chronic kidney disease, stage 3b: Secondary | ICD-10-CM | POA: Diagnosis not present

## 2024-06-08 DIAGNOSIS — Z0001 Encounter for general adult medical examination with abnormal findings: Secondary | ICD-10-CM

## 2024-06-08 DIAGNOSIS — E785 Hyperlipidemia, unspecified: Secondary | ICD-10-CM

## 2024-06-08 DIAGNOSIS — R600 Localized edema: Secondary | ICD-10-CM

## 2024-06-08 DIAGNOSIS — I10 Essential (primary) hypertension: Secondary | ICD-10-CM | POA: Diagnosis not present

## 2024-06-08 DIAGNOSIS — F411 Generalized anxiety disorder: Secondary | ICD-10-CM

## 2024-06-08 DIAGNOSIS — Z8673 Personal history of transient ischemic attack (TIA), and cerebral infarction without residual deficits: Secondary | ICD-10-CM

## 2024-06-08 DIAGNOSIS — K219 Gastro-esophageal reflux disease without esophagitis: Secondary | ICD-10-CM | POA: Diagnosis not present

## 2024-06-08 DIAGNOSIS — Z794 Long term (current) use of insulin: Secondary | ICD-10-CM | POA: Diagnosis not present

## 2024-06-08 DIAGNOSIS — E119 Type 2 diabetes mellitus without complications: Secondary | ICD-10-CM | POA: Diagnosis not present

## 2024-06-08 LAB — LIPID PANEL

## 2024-06-08 LAB — BAYER DCA HB A1C WAIVED: HB A1C (BAYER DCA - WAIVED): 7.6 % — ABNORMAL HIGH (ref 4.8–5.6)

## 2024-06-08 MED ORDER — PANTOPRAZOLE SODIUM 40 MG PO TBEC: 40.0000 mg | DELAYED_RELEASE_TABLET | Freq: Every day | ORAL | 1 refills | Status: DC

## 2024-06-08 MED ORDER — TOLTERODINE TARTRATE ER 4 MG PO CP24: 4.0000 mg | ORAL_CAPSULE | Freq: Every day | ORAL | 1 refills | Status: DC

## 2024-06-08 MED ORDER — FUROSEMIDE 20 MG PO TABS
20.0000 mg | ORAL_TABLET | Freq: Every day | ORAL | 1 refills | Status: DC | PRN
Start: 2024-06-08 — End: 2024-09-10

## 2024-06-08 MED ORDER — METOPROLOL SUCCINATE ER 25 MG PO TB24: 25.0000 mg | ORAL_TABLET | Freq: Every day | ORAL | 1 refills | Status: DC

## 2024-06-08 MED ORDER — OLMESARTAN MEDOXOMIL 40 MG PO TABS: 40.0000 mg | ORAL_TABLET | Freq: Every day | ORAL | 1 refills | Status: DC

## 2024-06-08 NOTE — Patient Instructions (Signed)
 Fall Prevention in the Home, Adult Falls can cause injuries and can happen to people of all ages. There are many things you can do to make your home safer and to help prevent falls. What actions can I take to prevent falls? General information Use good lighting in all rooms. Make sure to: Replace any light bulbs that burn out. Turn on the lights in dark areas and use night-lights. Keep items that you use often in easy-to-reach places. Lower the shelves around your home if needed. Move furniture so that there are clear paths around it. Do not use throw rugs or other things on the floor that can make you trip. If any of your floors are uneven, fix them. Add color or contrast paint or tape to clearly mark and help you see: Grab bars or handrails. First and last steps of staircases. Where the edge of each step is. If you use a ladder or stepladder: Make sure that it is fully opened. Do not climb a closed ladder. Make sure the sides of the ladder are locked in place. Have someone hold the ladder while you use it. Know where your pets are as you move through your home. What can I do in the bathroom?     Keep the floor dry. Clean up any water on the floor right away. Remove soap buildup in the bathtub or shower. Buildup makes bathtubs and showers slippery. Use non-skid mats or decals on the floor of the bathtub or shower. Attach bath mats securely with double-sided, non-slip rug tape. If you need to sit down in the shower, use a non-slip stool. Install grab bars by the toilet and in the bathtub and shower. Do not use towel bars as grab bars. What can I do in the bedroom? Make sure that you have a light by your bed that is easy to reach. Do not use any sheets or blankets on your bed that hang to the floor. Have a firm chair or bench with side arms that you can use for support when you get dressed. What can I do in the kitchen? Clean up any spills right away. If you need to reach something  above you, use a step stool with a grab bar. Keep electrical cords out of the way. Do not use floor polish or wax that makes floors slippery. What can I do with my stairs? Do not leave anything on the stairs. Make sure that you have a light switch at the top and the bottom of the stairs. Make sure that there are handrails on both sides of the stairs. Fix handrails that are broken or loose. Install non-slip stair treads on all your stairs if they do not have carpet. Avoid having throw rugs at the top or bottom of the stairs. Choose a carpet that does not hide the edge of the steps on the stairs. Make sure that the carpet is firmly attached to the stairs. Fix carpet that is loose or worn. What can I do on the outside of my home? Use bright outdoor lighting. Fix the edges of walkways and driveways and fix any cracks. Clear paths of anything that can make you trip, such as tools or rocks. Add color or contrast paint or tape to clearly mark and help you see anything that might make you trip as you walk through a door, such as a raised step or threshold. Trim any bushes or trees on paths to your home. Check to see if handrails are loose  or broken and that both sides of all steps have handrails. Install guardrails along the edges of any raised decks and porches. Have leaves, snow, or ice cleared regularly. Use sand, salt, or ice melter on paths if you live where there is ice and snow during the winter. Clean up any spills in your garage right away. This includes grease or oil spills. What other actions can I take? Review your medicines with your doctor. Some medicines can cause dizziness or changes in blood pressure, which increase your risk of falling. Wear shoes that: Have a low heel. Do not wear high heels. Have rubber bottoms and are closed at the toe. Feel good on your feet and fit well. Use tools that help you move around if needed. These include: Canes. Walkers. Scooters. Crutches. Ask  your doctor what else you can do to help prevent falls. This may include seeing a physical therapist to learn to do exercises to move better and get stronger. Where to find more information Centers for Disease Control and Prevention, [employer]: TonerPromos.no [employer] on Aging: BaseRingTones.pl National Institute on Aging: BaseRingTones.pl Contact a doctor if: You are afraid of falling at home. You feel weak, drowsy, or dizzy at home. You fall at home. Get help right away if you: Lose consciousness or have trouble moving after a fall. Have a fall that causes a head injury. These symptoms may be an emergency. Get help right away. Call 911. Do not wait to see if the symptoms will go away. Do not drive yourself to the hospital. This information is not intended to replace advice given to you by your health care provider. Make sure you discuss any questions you have with your health care provider. Document Revised: 06/24/2022 Document Reviewed: 06/24/2022 Elsevier Patient Education  2024 [employer].

## 2024-06-08 NOTE — Progress Notes (Signed)
 Subjective:    Patient ID: Kara Dean, female    DOB: 01/03/1933, 89 y.o.   MRN: [id number]   Chief Complaint: medical management of chronic issues    HPI:  Kara Dean is a 89 y.o. who identifies as a female who was assigned female at birth.   Social history: Lives with: granddaughter Work history: retired   Water engineer in today for follow up of the following chronic medical issues:  1. Essential hypertension No c/o chest pain, sob and headache. Does not check blood pressure at home. BP Readings from Last 3 Encounters:  04/27/24 (!) 136/49  04/19/24 (!) 140/43  03/15/24 126/62     2. Hyperlipidemia associated with type 2 diabetes mellitus (HCC) Does not watch diet very closely. Has a poor appetite. Lab Results  Component Value Date   CHOL 98 04/19/2024   HDL 38 (L) 04/19/2024   LDLCALC 40 04/19/2024   TRIG 99 04/19/2024   CHOLHDL 2.6 04/19/2024     3. Diabetes mellitus with insulin  therapy (HCC) Does not check blood sugars very often at home. Runs over 200 consistently. Has had some readings I the 400 when she checks them. We increased insulin  to 44u. Lab Results  Component Value Date   HGBA1C 9.1 (H) 03/08/2024      4. Gastroesophageal reflux disease without esophagitis Takes protonix  daily which works well for her.  5. Chronic kidney disease (CKD) stage G3a/A3, moderately decreased glomerular filtration rate (GFR) between 45-59 mL/min/1.73 square meter and albuminuria creatinine ratio greater than 300 mg/g (HCC) No voiding issue Lab Results  Component Value Date   CREATININE 1.25 (H) 04/19/2024     6. History  of cerebrovascular accident (CVA) No permanent effects  7. Peripheral edema Has occasionally  8. Recurrent major depressive disorder, in full remission (HCC) 9. GAD (generalized anxiety disorder) Is on celexa  daily and is doing well.    06/08/2024   10:56 AM 04/27/2024   12:03 PM 03/08/2024   11:32 AM 09/05/2023   12:10 PM  GAD 7 :  Generalized Anxiety Score  Nervous, Anxious, on Edge 1 0 1 2  Control/stop worrying 1 1 1 1   Worry too much - different things 1 1 1 1   Trouble relaxing 0 0 0 0  Restless 0 0 0 0  Easily annoyed or irritable 0 0 0 0  Afraid - awful might happen 1 0 1 1  Total GAD 7 Score 4 2 4 5   Anxiety Difficulty  Somewhat difficult Not difficult at all Not difficult at all         06/08/2024   10:55 AM 04/27/2024   12:02 PM 03/15/2024   11:00 AM  Depression screen PHQ 2/9  Decreased Interest 2 1 1   Down, Depressed, Hopeless 1 1 2   PHQ - 2 Score 3 2 3   Altered sleeping 0 0 0  Tired, decreased energy 1 1 1   Change in appetite 1 1 3   Feeling bad or failure about yourself  0 0 1  Trouble concentrating 0 0 0  Moving slowly or fidgety/restless 0 0 0  Suicidal thoughts 0 0 0  PHQ-9 Score 5 4 8   Difficult doing work/chores Somewhat difficult Somewhat difficult Not difficult at all       New complaints: None today  Allergies  Allergen Reactions   Codeine Other (See Comments)    Unknown    Morphine Other (See Comments)    Unknown    Niacin Other (See Comments)  Unknown    Latex Rash   Sulfa Antibiotics Rash   Sulfonamide Derivatives Rash   Outpatient Encounter Medications as of 06/08/2024  Medication Sig   amLODipine  (NORVASC ) 10 MG tablet Take 1 tablet (10 mg total) by mouth daily.   aspirin  81 MG chewable tablet Chew 1 tablet (81 mg total) by mouth daily.   atorvastatin  (LIPITOR) 20 MG tablet Take 1 tablet (20 mg total) by mouth daily.   Blood Glucose Monitoring Suppl DEVI Check BGs 3 times daily. E11.9 May substitute to any manufacturer covered by patient's insurance.   citalopram  (CELEXA ) 20 MG tablet Take 1 tablet (20 mg total) by mouth daily.   fluticasone  (FLONASE ) 50 MCG/ACT nasal spray USE 2 SPRAYS IN EACH NOSTRIL ONCE DAILY   furosemide  (LASIX ) 20 MG tablet Take 20 mg by mouth daily as needed for fluid or edema.   Glucose Blood (BLOOD GLUCOSE TEST STRIPS) STRP Check BGs 3  times daily. E11.9 May substitute to any manufacturer covered by patient's insurance.   Insulin  Pen Needle (PIP PEN NEEDLES 32G X ) 32G X 4 MM MISC UAD   Lancet Device MISC Check BGs 3 times daily. E11.9 May substitute to any manufacturer covered by patient's insurance.   Lancets Misc. MISC Check BGs 3 times daily. E11.9 May substitute to any manufacturer covered by patient's insurance.   LANTUS  SOLOSTAR 100 UNIT/ML Solostar Pen Inject 48 Units into the skin daily.   LORazepam  (ATIVAN ) 0.5 MG tablet Take 1 tablet (0.5 mg total) by mouth 2 (two) times daily.   metoprolol  succinate (TOPROL -XL) 25 MG 24 hr tablet TAKE ONE TABLET DAILY   Multiple Vitamin (MULTIVITAMIN) tablet Take 1 tablet by mouth daily.   olmesartan  (BENICAR ) 40 MG tablet TAKE ONE TABLET DAILY   pantoprazole  (PROTONIX ) 40 MG tablet TAKE ONE TABLET DAILY   potassium chloride  (KLOR-CON ) 10 MEQ tablet Take 1 tablet (10 mEq total) by mouth daily.   tolterodine  (DETROL  LA) 4 MG 24 hr capsule TAKE ONE CAPSULE DAILY   No facility-administered encounter medications on file as of 06/08/2024.    Past Surgical History:  Procedure Laterality Date   ABDOMINAL HYSTERECTOMY     BACK SURGERY  11/04/1992   CATARACT EXTRACTION W/PHACO Left 09/19/2017   Procedure: CATARACT EXTRACTION PHACO AND INTRAOCULAR LENS PLACEMENT (IOC);  Surgeon: Harrie Agent, MD;  Location: [facility];  Service: Ophthalmology;  Laterality: Left;  CDE: 8.10   CATARACT EXTRACTION W/PHACO Right 10/10/2017   Procedure: CATARACT EXTRACTION PHACO AND INTRAOCULAR LENS PLACEMENT RIGHT EYE;  Surgeon: Harrie Agent, MD;  Location: [facility];  Service: Ophthalmology;  Laterality: Right;  CDE: 9.60   CHOLECYSTECTOMY N/A 05/10/2021   Procedure: LAPAROSCOPIC CHOLECYSTECTOMY;  Surgeon: Signe Mitzie LABOR, MD;  Location: [facility];  Service: General;  Laterality: N/A;   COLON RESECTION  07/05/1989   LOOP RECORDER IMPLANT     LNQ22 LINQ II   TUBAL LIGATION     VESICOVAGINAL FISTULA CLOSURE  W/ TAH  11/04/1982    Family History  Problem Relation Age of Onset   Coronary artery disease Sister        stent x 3   Seizures Sister    Coronary artery disease Brother        stent   Hypertension Mother    Stroke Mother    Emphysema Father    Alcohol abuse Brother    Lung disease Brother    Heart attack Daughter       Controlled substance contract: 12/11/22  Review of Systems  Constitutional:  Negative for diaphoresis.  Eyes:  Negative for pain.  Respiratory:  Negative for shortness of breath.   Cardiovascular:  Negative for chest pain, palpitations and leg swelling.  Gastrointestinal:  Negative for abdominal pain.  Endocrine: Negative for polydipsia.  Skin:  Negative for rash.  Neurological:  Negative for dizziness, weakness and headaches.  Hematological:  Does not bruise/bleed easily.  All other systems reviewed and are negative.      Objective:   Physical Exam Vitals and nursing note reviewed.  Constitutional:      General: She is not in acute distress.    Appearance: Normal appearance. She is well-developed.  HENT:     Head: Normocephalic.     Right Ear: Tympanic membrane normal.     Left Ear: Tympanic membrane normal.     Nose: Nose normal.     Mouth/Throat:     Mouth: Mucous membranes are moist.  Eyes:     Pupils: Pupils are equal, round, and reactive to light.  Neck:     Vascular: No carotid bruit or JVD.  Cardiovascular:     Rate and Rhythm: Normal rate and regular rhythm.     Heart sounds: Murmur (2/6 systolic) heard.  Pulmonary:     Effort: Pulmonary effort is normal. No respiratory distress.     Breath sounds: Rales (bil lower lobes) present. No wheezing.  Chest:     Chest wall: No tenderness.  Abdominal:     General: Bowel sounds are normal. There is no distension or abdominal bruit.     Palpations: Abdomen is soft. There is no hepatomegaly, splenomegaly, mass or pulsatile mass.     Tenderness: There is no abdominal tenderness.   Musculoskeletal:        General: Normal range of motion.     Cervical back: Normal range of motion and neck supple.  Lymphadenopathy:     Cervical: No cervical adenopathy.  Skin:    General: Skin is warm and dry.  Neurological:     Mental Status: She is alert and oriented to person, place, and time.     Deep Tendon Reflexes: Reflexes are normal and symmetric.  Psychiatric:        Behavior: Behavior normal.        Thought Content: Thought content normal.        Judgment: Judgment normal.    BP (!) 154/62   Pulse 73   Temp 97.7 F (36.5 C) (Temporal)   Ht 5' 1 (1.549 m)   Wt 120 lb (54.4 kg)   SpO2 93%   BMI 22.67 kg/m      HGBA1c  7.6%     Assessment & Plan:   Kara Dean comes in today with chief complaint of annual physical  Diagnosis and orders addressed:  1. Essential hypertension Low sodium diet - CBC with Differential/Platelet - CMP14+EGFR - amLODipine  (NORVASC ) 10 MG tablet; Take 1 tablet (10 mg total) by mouth daily.  Dispense: 90 tablet; Refill: 1 - metoprolol  succinate (TOPROL -XL) 25 MG 24 hr tablet; Take 1 tablet (25 mg total) by mouth daily.  Dispense: 90 tablet; Refill: 1 - olmesartan  (BENICAR ) 40 MG tablet; Take 1 tablet (40 mg total) by mouth daily.  Dispense: 90 tablet; Refill: 1 - potassium chloride  (KLOR-CON ) 10 MEQ tablet; Take 1 tablet (10 mEq total) by mouth as directed. Mon, Wed, and Fri only.  2. Hyperlipidemia associated with type 2 diabetes mellitus (HCC) Low fat diet - Lipid panel -  atorvastatin  (LIPITOR) 10 MG tablet; Take 1 tablet (10 mg total) by mouth daily. (NEEDS TO BE SEEN BEFORE NEXT REFILL)  Dispense: 90 tablet; Refill: 1  3. Diabetes mellitus with insulin  therapy (HCC) Continue to watch carbs in diet - Bayer DCA Hb A1c Waived - LANTUS  SOLOSTAR 100 UNIT/ML Solostar Pen; Inject 40 Units into the skin daily.  Dispense: 15 mL; Refill: 5  4. Gastroesophageal reflux disease without esophagitis Avoid spicy foods Do not eat 2  hours prior to bedtime  - pantoprazole  (PROTONIX ) 40 MG tablet; Take 1 tablet (40 mg total) by mouth daily. (NEEDS TO BE SEEN BEFORE NEXT REFILL)  Dispense: 90 tablet; Refill: 1  5. Chronic kidney disease (CKD) stage G3a/A3, moderately decreased glomerular filtration rate (GFR) between 45-59 mL/min/1.73 square meter and albuminuria creatinine ratio greater than 300 mg/g (HCC) Labs pending  6. Late effect of cerebrovascular accident (CVA) Report any weakness - dipyridamole -aspirin  (AGGRENOX ) 200-25 MG 12hr capsule; Take 1 capsule by mouth daily.  Dispense: 90 capsule; Refill: 1  7. Peripheral edema Elevtae legs when sitting - furosemide  (LASIX ) 20 MG tablet; Take 1 tablet (20 mg total) by mouth daily.  Dispense: 90 tablet; Refill: 1  8. Recurrent major depressive disorder, in full remission (HCC) Stress management  9. GAD (generalized anxiety disorder) - citalopram  (CELEXA ) 20 MG tablet; Take 1 tablet (20 mg total) by mouth daily.  Dispense: 90 tablet; Refill: 1 - LORazepam  (ATIVAN ) 0.5 MG tablet; Take 1 tablet (0.5 mg total) by mouth 2 (two) times daily.  Dispense: 60 tablet; Refill: 5  10. Unsteady gait Order to continue pt at home  Labs pending Health Maintenance reviewed Diet and exercise encouraged  Follow up plan: 6 month   Mary-Margaret Gladis, FNP

## 2024-06-09 LAB — CMP14+EGFR
ALT: 10 IU/L (ref 0–32)
AST: 16 IU/L (ref 0–40)
Albumin: 4.3 g/dL (ref 3.7–4.7)
Alkaline Phosphatase: 84 IU/L (ref 44–121)
BUN/Creatinine Ratio: 15 (ref 12–28)
BUN: 20 mg/dL (ref 8–27)
Bilirubin Total: 0.6 mg/dL (ref 0.0–1.2)
CO2: 21 mmol/L (ref 20–29)
Calcium: 10.4 mg/dL — AB (ref 8.7–10.3)
Chloride: 102 mmol/L (ref 96–106)
Creatinine, Ser: 1.3 mg/dL — AB (ref 0.57–1.00)
Globulin, Total: 2.7 g/dL (ref 1.5–4.5)
Glucose: 239 mg/dL — AB (ref 70–99)
Potassium: 4.4 mmol/L (ref 3.5–5.2)
Sodium: 140 mmol/L (ref 134–144)
Total Protein: 7 g/dL (ref 6.0–8.5)
eGFR: 40 mL/min/1.73 — AB (ref >59)

## 2024-06-09 LAB — CBC WITH DIFFERENTIAL/PLATELET
Basophils Absolute: 0.1 x10E3/uL (ref 0.0–0.2)
Basos: 1 %
EOS (ABSOLUTE): 0.3 x10E3/uL (ref 0.0–0.4)
Eos: 3 %
Hematocrit: 41.3 % (ref 34.0–46.6)
Hemoglobin: 13.2 g/dL (ref 11.1–15.9)
Immature Grans (Abs): 0 x10E3/uL (ref 0.0–0.1)
Immature Granulocytes: 0 %
Lymphocytes Absolute: 1.4 x10E3/uL (ref 0.7–3.1)
Lymphs: 11 %
MCH: 29.5 pg (ref 26.6–33.0)
MCHC: 32 g/dL (ref 31.5–35.7)
MCV: 92 fL (ref 79–97)
Monocytes Absolute: 0.7 x10E3/uL (ref 0.1–0.9)
Monocytes: 6 %
Neutrophils Absolute: 9.7 x10E3/uL — ABNORMAL HIGH (ref 1.4–7.0)
Neutrophils: 79 %
Platelets: 210 x10E3/uL (ref 150–450)
RBC: 4.47 x10E6/uL (ref 3.77–5.28)
RDW: 12.8 % (ref 11.7–15.4)
WBC: 12.2 x10E3/uL — ABNORMAL HIGH (ref 3.4–10.8)

## 2024-06-09 LAB — LIPID PANEL
Cholesterol, Total: 112 mg/dL (ref 100–199)
HDL: 41 mg/dL (ref >39)
LDL CALC COMMENT:: 2.7 ratio (ref 0.0–4.4)
LDL Chol Calc (NIH): 45 mg/dL (ref 0–99)
Triglycerides: 154 mg/dL — AB (ref 0–149)
VLDL Cholesterol Cal: 26 mg/dL (ref 5–40)

## 2024-06-10 ENCOUNTER — Ambulatory Visit: Payer: Self-pay | Admitting: Nurse Practitioner

## 2024-06-10 DIAGNOSIS — E1165 Type 2 diabetes mellitus with hyperglycemia: Secondary | ICD-10-CM | POA: Diagnosis not present

## 2024-06-10 DIAGNOSIS — I700 Atherosclerosis of aorta: Secondary | ICD-10-CM | POA: Diagnosis not present

## 2024-06-10 DIAGNOSIS — Z556 Problems related to health literacy: Secondary | ICD-10-CM | POA: Diagnosis not present

## 2024-06-10 DIAGNOSIS — I480 Paroxysmal atrial fibrillation: Secondary | ICD-10-CM | POA: Diagnosis not present

## 2024-06-10 DIAGNOSIS — Z794 Long term (current) use of insulin: Secondary | ICD-10-CM | POA: Diagnosis not present

## 2024-06-10 DIAGNOSIS — I130 Hypertensive heart and chronic kidney disease with heart failure and stage 1 through stage 4 chronic kidney disease, or unspecified chronic kidney disease: Secondary | ICD-10-CM | POA: Diagnosis not present

## 2024-06-10 DIAGNOSIS — E8809 Other disorders of plasma-protein metabolism, not elsewhere classified: Secondary | ICD-10-CM | POA: Diagnosis not present

## 2024-06-10 DIAGNOSIS — H538 Other visual disturbances: Secondary | ICD-10-CM | POA: Diagnosis not present

## 2024-06-10 DIAGNOSIS — I2510 Atherosclerotic heart disease of native coronary artery without angina pectoris: Secondary | ICD-10-CM | POA: Diagnosis not present

## 2024-06-10 DIAGNOSIS — E46 Unspecified protein-calorie malnutrition: Secondary | ICD-10-CM | POA: Diagnosis not present

## 2024-06-10 DIAGNOSIS — I088 Other rheumatic multiple valve diseases: Secondary | ICD-10-CM | POA: Diagnosis not present

## 2024-06-10 DIAGNOSIS — E1122 Type 2 diabetes mellitus with diabetic chronic kidney disease: Secondary | ICD-10-CM | POA: Diagnosis not present

## 2024-06-10 DIAGNOSIS — M1990 Unspecified osteoarthritis, unspecified site: Secondary | ICD-10-CM | POA: Diagnosis not present

## 2024-06-10 DIAGNOSIS — N1832 Chronic kidney disease, stage 3b: Secondary | ICD-10-CM | POA: Diagnosis not present

## 2024-06-10 DIAGNOSIS — K5010 Crohn's disease of large intestine without complications: Secondary | ICD-10-CM | POA: Diagnosis not present

## 2024-06-10 DIAGNOSIS — Z7982 Long term (current) use of aspirin: Secondary | ICD-10-CM | POA: Diagnosis not present

## 2024-06-10 DIAGNOSIS — E782 Mixed hyperlipidemia: Secondary | ICD-10-CM | POA: Diagnosis not present

## 2024-06-10 DIAGNOSIS — I5032 Chronic diastolic (congestive) heart failure: Secondary | ICD-10-CM | POA: Diagnosis not present

## 2024-06-10 DIAGNOSIS — Z8673 Personal history of transient ischemic attack (TIA), and cerebral infarction without residual deficits: Secondary | ICD-10-CM | POA: Diagnosis not present

## 2024-06-10 DIAGNOSIS — K219 Gastro-esophageal reflux disease without esophagitis: Secondary | ICD-10-CM | POA: Diagnosis not present

## 2024-06-15 DIAGNOSIS — E46 Unspecified protein-calorie malnutrition: Secondary | ICD-10-CM | POA: Diagnosis not present

## 2024-06-15 DIAGNOSIS — K219 Gastro-esophageal reflux disease without esophagitis: Secondary | ICD-10-CM | POA: Diagnosis not present

## 2024-06-15 DIAGNOSIS — I5032 Chronic diastolic (congestive) heart failure: Secondary | ICD-10-CM | POA: Diagnosis not present

## 2024-06-15 DIAGNOSIS — Z8673 Personal history of transient ischemic attack (TIA), and cerebral infarction without residual deficits: Secondary | ICD-10-CM | POA: Diagnosis not present

## 2024-06-15 DIAGNOSIS — I700 Atherosclerosis of aorta: Secondary | ICD-10-CM | POA: Diagnosis not present

## 2024-06-15 DIAGNOSIS — Z7982 Long term (current) use of aspirin: Secondary | ICD-10-CM | POA: Diagnosis not present

## 2024-06-15 DIAGNOSIS — Z794 Long term (current) use of insulin: Secondary | ICD-10-CM | POA: Diagnosis not present

## 2024-06-15 DIAGNOSIS — I480 Paroxysmal atrial fibrillation: Secondary | ICD-10-CM | POA: Diagnosis not present

## 2024-06-15 DIAGNOSIS — E1165 Type 2 diabetes mellitus with hyperglycemia: Secondary | ICD-10-CM | POA: Diagnosis not present

## 2024-06-15 DIAGNOSIS — E1122 Type 2 diabetes mellitus with diabetic chronic kidney disease: Secondary | ICD-10-CM | POA: Diagnosis not present

## 2024-06-15 DIAGNOSIS — I2510 Atherosclerotic heart disease of native coronary artery without angina pectoris: Secondary | ICD-10-CM | POA: Diagnosis not present

## 2024-06-15 DIAGNOSIS — I130 Hypertensive heart and chronic kidney disease with heart failure and stage 1 through stage 4 chronic kidney disease, or unspecified chronic kidney disease: Secondary | ICD-10-CM | POA: Diagnosis not present

## 2024-06-15 DIAGNOSIS — H538 Other visual disturbances: Secondary | ICD-10-CM | POA: Diagnosis not present

## 2024-06-15 DIAGNOSIS — Z556 Problems related to health literacy: Secondary | ICD-10-CM | POA: Diagnosis not present

## 2024-06-15 DIAGNOSIS — N1832 Chronic kidney disease, stage 3b: Secondary | ICD-10-CM | POA: Diagnosis not present

## 2024-06-15 DIAGNOSIS — I088 Other rheumatic multiple valve diseases: Secondary | ICD-10-CM | POA: Diagnosis not present

## 2024-06-15 DIAGNOSIS — E8809 Other disorders of plasma-protein metabolism, not elsewhere classified: Secondary | ICD-10-CM | POA: Diagnosis not present

## 2024-06-15 DIAGNOSIS — M1990 Unspecified osteoarthritis, unspecified site: Secondary | ICD-10-CM | POA: Diagnosis not present

## 2024-06-15 DIAGNOSIS — K5010 Crohn's disease of large intestine without complications: Secondary | ICD-10-CM | POA: Diagnosis not present

## 2024-06-15 DIAGNOSIS — E782 Mixed hyperlipidemia: Secondary | ICD-10-CM | POA: Diagnosis not present

## 2024-06-21 ENCOUNTER — Ambulatory Visit (INDEPENDENT_AMBULATORY_CARE_PROVIDER_SITE_OTHER)

## 2024-06-21 DIAGNOSIS — I639 Cerebral infarction, unspecified: Secondary | ICD-10-CM

## 2024-06-22 LAB — CUP PACEART REMOTE DEVICE CHECK
Date Time Interrogation Session: 20250817183936
Implantable Pulse Generator Implant Date: 20250411

## 2024-06-24 ENCOUNTER — Ambulatory Visit: Payer: Self-pay | Admitting: Cardiovascular Disease

## 2024-07-02 ENCOUNTER — Encounter

## 2024-07-20 DIAGNOSIS — M79675 Pain in left toe(s): Secondary | ICD-10-CM | POA: Diagnosis not present

## 2024-07-20 DIAGNOSIS — I70203 Unspecified atherosclerosis of native arteries of extremities, bilateral legs: Secondary | ICD-10-CM | POA: Diagnosis not present

## 2024-07-20 DIAGNOSIS — M79674 Pain in right toe(s): Secondary | ICD-10-CM | POA: Diagnosis not present

## 2024-07-20 DIAGNOSIS — B351 Tinea unguium: Secondary | ICD-10-CM | POA: Diagnosis not present

## 2024-07-20 DIAGNOSIS — L84 Corns and callosities: Secondary | ICD-10-CM | POA: Diagnosis not present

## 2024-07-22 ENCOUNTER — Ambulatory Visit (INDEPENDENT_AMBULATORY_CARE_PROVIDER_SITE_OTHER)

## 2024-07-22 DIAGNOSIS — I639 Cerebral infarction, unspecified: Secondary | ICD-10-CM

## 2024-07-22 LAB — CUP PACEART REMOTE DEVICE CHECK
Date Time Interrogation Session: 20250917183947
Implantable Pulse Generator Implant Date: 20250411

## 2024-07-24 ENCOUNTER — Ambulatory Visit: Payer: Self-pay | Admitting: Cardiovascular Disease

## 2024-07-27 NOTE — Progress Notes (Signed)
 Remote Loop Recorder Transmission

## 2024-07-28 NOTE — Progress Notes (Signed)
 Remote Loop Recorder Transmission

## 2024-07-31 DIAGNOSIS — R531 Weakness: Secondary | ICD-10-CM | POA: Diagnosis not present

## 2024-08-06 ENCOUNTER — Encounter

## 2024-08-10 NOTE — Progress Notes (Signed)
 Remote Loop Recorder Transmission

## 2024-08-23 ENCOUNTER — Encounter

## 2024-08-23 ENCOUNTER — Ambulatory Visit: Attending: Cardiovascular Disease

## 2024-08-23 DIAGNOSIS — I639 Cerebral infarction, unspecified: Secondary | ICD-10-CM

## 2024-08-24 LAB — CUP PACEART REMOTE DEVICE CHECK
Date Time Interrogation Session: 20251019234607
Implantable Pulse Generator Implant Date: 20250411

## 2024-08-27 NOTE — Progress Notes (Signed)
 Remote Loop Recorder Transmission

## 2024-09-01 ENCOUNTER — Ambulatory Visit: Payer: Self-pay | Admitting: Cardiovascular Disease

## 2024-09-01 ENCOUNTER — Emergency Department (HOSPITAL_BASED_OUTPATIENT_CLINIC_OR_DEPARTMENT_OTHER)

## 2024-09-01 ENCOUNTER — Emergency Department (HOSPITAL_BASED_OUTPATIENT_CLINIC_OR_DEPARTMENT_OTHER)
Admission: EM | Admit: 2024-09-01 | Discharge: 2024-09-01 | Disposition: A | Discharge status: Home / Self Care | Source: Ambulatory Visit | Attending: Emergency Medicine | Admitting: Emergency Medicine

## 2024-09-01 ENCOUNTER — Ambulatory Visit: Payer: Self-pay

## 2024-09-01 ENCOUNTER — Other Ambulatory Visit: Payer: Self-pay

## 2024-09-01 DIAGNOSIS — M79604 Pain in right leg: Secondary | ICD-10-CM | POA: Diagnosis not present

## 2024-09-01 DIAGNOSIS — Z794 Long term (current) use of insulin: Secondary | ICD-10-CM | POA: Insufficient documentation

## 2024-09-01 DIAGNOSIS — M25551 Pain in right hip: Secondary | ICD-10-CM | POA: Insufficient documentation

## 2024-09-01 DIAGNOSIS — Z8673 Personal history of transient ischemic attack (TIA), and cerebral infarction without residual deficits: Secondary | ICD-10-CM | POA: Diagnosis not present

## 2024-09-01 DIAGNOSIS — Z79899 Other long term (current) drug therapy: Secondary | ICD-10-CM | POA: Insufficient documentation

## 2024-09-01 DIAGNOSIS — W108XXA Fall (on) (from) other stairs and steps, initial encounter: Secondary | ICD-10-CM | POA: Insufficient documentation

## 2024-09-01 DIAGNOSIS — N281 Cyst of kidney, acquired: Secondary | ICD-10-CM | POA: Diagnosis not present

## 2024-09-01 DIAGNOSIS — Z91040 Latex allergy status: Secondary | ICD-10-CM | POA: Insufficient documentation

## 2024-09-01 DIAGNOSIS — K8689 Other specified diseases of pancreas: Secondary | ICD-10-CM | POA: Diagnosis not present

## 2024-09-01 DIAGNOSIS — Z7982 Long term (current) use of aspirin: Secondary | ICD-10-CM | POA: Insufficient documentation

## 2024-09-01 DIAGNOSIS — S0990XA Unspecified injury of head, initial encounter: Secondary | ICD-10-CM | POA: Insufficient documentation

## 2024-09-01 DIAGNOSIS — W19XXXA Unspecified fall, initial encounter: Secondary | ICD-10-CM

## 2024-09-01 DIAGNOSIS — R109 Unspecified abdominal pain: Secondary | ICD-10-CM | POA: Diagnosis not present

## 2024-09-01 NOTE — ED Provider Notes (Signed)
 [place] EMERGENCY DEPARTMENT AT [facility] Provider Note   CSN: [id number] Arrival date & time: 09/01/24  1622     Patient presents with: Kara Dean Kara Dean is a 89 y.o. female.   Patient is here for evaluation of pain after fall. Patient had a witnessed fall at home on Sunday (10/26). Daughter / caregiver saw her mother fall to her bottom from a small set of steps while she was getting into bed. She landed more so on her right hip and also hit the left back side of her head on a nearby dresser. She did not lose consciousness with the fall. Daughter reports there was no obvious injury to the back of the head and she never developed any bruising or swelling to the head, back, or hip. She did not have immediate evaluation that day. She denies any confusion, LOC, nausea, or vomiting since the fall. Patient does have a history of stroke with left-sided deficits. Daughter states the patient was not using her walker and was wearing slick socks at the time of the fall.   Patient was seen by her PCP earlier today for evaluation of worsening pain that the patient describes as at my asshole and radiates down her right leg to her knee and heel. She denies any numbness or tingling. She does wear adult diapers and has some urinary and bowel incontinence at baseline, mostly due to being unable to make it to the bathroom in time. She denies any increase in incontinence since the fall. The pain is worse with sitting, even when using pillows in an attempt to be more comfortable. The pain is better with walking. The patient uses a walker. She denies any additional falls since Sunday. PCP recommended she be evaluated further in the ED.  Patient denies any dysuria, urinary frequency or urgency. She denies any known fevers. She cannot recall the last time she had a UTI. She does not feel that she has a UTI at this time and has no urinary complaints.  The history is provided by the patient, a  caregiver and a relative.  Fall This is a new problem. The current episode started more than 2 days ago. The problem has been gradually worsening. Pertinent negatives include no chest pain, no abdominal pain, no headaches and no shortness of breath. The symptoms are relieved by walking. She has tried rest for the symptoms.       Prior to Admission medications   Medication Sig Start Date End Date Taking? Authorizing Provider  amLODipine  (NORVASC ) 10 MG tablet Take 1 tablet (10 mg total) by mouth daily. 03/08/24   Gladis Mustard, FNP  aspirin  81 MG chewable tablet Chew 1 tablet (81 mg total) by mouth daily. 10/20/23   Evonnie Lenis, MD  atorvastatin  (LIPITOR) 20 MG tablet Take 1 tablet (20 mg total) by mouth daily. 03/08/24   Gladis, Mary-Margaret, FNP  Blood Glucose Monitoring Suppl DEVI Check BGs 3 times daily. E11.9 May substitute to any manufacturer covered by patient's insurance. 05/25/24   Jolinda Norene HERO, DO  citalopram  (CELEXA ) 20 MG tablet Take 1 tablet (20 mg total) by mouth daily. 03/08/24   Gladis Mary-Margaret, FNP  fluticasone  (FLONASE ) 50 MCG/ACT nasal spray USE 2 SPRAYS IN EACH NOSTRIL ONCE DAILY 05/28/24   Gladis, Mary-Margaret, FNP  furosemide  (LASIX ) 20 MG tablet Take 1 tablet (20 mg total) by mouth daily as needed for fluid or edema. 06/08/24   Gladis, Mary-Margaret, FNP  Glucose Blood (BLOOD GLUCOSE TEST  STRIPS) STRP Check BGs 3 times daily. E11.9 May substitute to any manufacturer covered by patient's insurance. 05/25/24   Jolinda Norene HERO, DO  Insulin  Pen Needle (PIP PEN NEEDLES 32G X ) 32G X 4 MM MISC UAD 04/08/24   Gladis Mustard, FNP  Lancet Device MISC Check BGs 3 times daily. E11.9 May substitute to any manufacturer covered by patient's insurance. 05/25/24   Jolinda Norene HERO, DO  Lancets Misc. MISC Check BGs 3 times daily. E11.9 May substitute to any manufacturer covered by patient's insurance. 05/25/24   Jolinda Norene HERO, DO  LANTUS  SOLOSTAR 100 UNIT/ML  Solostar Pen Inject 48 Units into the skin daily. 03/08/24   Gladis, Mary-Margaret, FNP  LORazepam  (ATIVAN ) 0.5 MG tablet Take 1 tablet (0.5 mg total) by mouth 2 (two) times daily. 03/08/24   Gladis Mary-Margaret, FNP  metoprolol  succinate (TOPROL -XL) 25 MG 24 hr tablet Take 1 tablet (25 mg total) by mouth daily. 06/08/24   Gladis Mary-Margaret, FNP  Multiple Vitamin (MULTIVITAMIN) tablet Take 1 tablet by mouth daily.    [provider]  olmesartan  (BENICAR ) 40 MG tablet Take 1 tablet (40 mg total) by mouth daily. 06/08/24   Gladis Mary-Margaret, FNP  pantoprazole  (PROTONIX ) 40 MG tablet Take 1 tablet (40 mg total) by mouth daily. 06/08/24   Gladis Mary-Margaret, FNP  potassium chloride  (KLOR-CON ) 10 MEQ tablet Take 1 tablet (10 mEq total) by mouth daily. 03/08/24   Gladis, Mary-Margaret, FNP  tolterodine  (DETROL  LA) 4 MG 24 hr capsule Take 1 capsule (4 mg total) by mouth daily. 06/08/24   Gladis Mary-Margaret, FNP    Allergies: Codeine, Morphine, Niacin, Latex, Sulfa antibiotics, and Sulfonamide derivatives    Review of Systems  Respiratory:  Negative for shortness of breath.   Cardiovascular:  Negative for chest pain.  Gastrointestinal:  Negative for abdominal pain and vomiting.  Neurological:  Negative for syncope, weakness, numbness and headaches.  Psychiatric/Behavioral:  Negative for confusion.     Updated Vital Signs BP 135/71   Pulse (!) 59   Temp 97.7 F (36.5 C) (Oral)   Resp 18   SpO2 100%   Physical Exam Vitals and nursing note reviewed.  Constitutional:      General: She is not in acute distress.    Appearance: Normal appearance.  HENT:     Head: Normocephalic and atraumatic.     Comments: No obvious injuries to the back of the head. No tenderness with palpation. Eyes:     Extraocular Movements: Extraocular movements intact.     Pupils: Pupils are equal, round, and reactive to light.  Cardiovascular:     Rate and Rhythm: Normal rate and regular rhythm.      Pulses: Normal pulses.          Dorsalis pedis pulses are 2+ on the right side and 2+ on the left side.  Pulmonary:     Effort: Pulmonary effort is normal. No respiratory distress.     Breath sounds: Normal breath sounds. No stridor. No wheezing, rhonchi or rales.  Abdominal:     General: Abdomen is flat. Bowel sounds are normal. There is no distension.     Palpations: Abdomen is soft. There is no mass.     Tenderness: There is no abdominal tenderness. There is no guarding.     Hernia: No hernia is present.  Musculoskeletal:        General: No tenderness, deformity or signs of injury.     Comments: No pain with palpation of lower back,  bilateral hips, or left lower extremity. No obvious bruising or swelling of the lower back, right hip, or left lower extremity. No obvious deformities noted.  Skin:    General: Skin is warm and dry.  Neurological:     Mental Status: She is alert and oriented to person, place, and time.     Sensory: No sensory deficit.     Comments: Equal strength of bilateral lower extremities with sensation and motor intact.     (all labs ordered are listed, but only abnormal results are displayed) Labs Reviewed - No data to display  EKG: None  Radiology: CT Head Wo Contrast Result Date: 09/01/2024 CLINICAL DATA:  Fall 4 days ago. EXAM: CT HEAD WITHOUT CONTRAST CT CERVICAL SPINE WITHOUT CONTRAST TECHNIQUE: Multidetector CT imaging of the head and cervical spine was performed following the standard protocol without intravenous contrast. Multiplanar CT image reconstructions of the cervical spine were also generated. RADIATION DOSE REDUCTION: This exam was performed according to the departmental dose-optimization program which includes automated exposure control, adjustment of the mA and/or kV according to patient size and/or use of iterative reconstruction technique. COMPARISON:  Head CT 04/18/2024 FINDINGS: CT HEAD FINDINGS Brain: Ventricles, cisterns and other CSF  spaces are within normal. There is mild age related atrophic change as well as mild to moderate chronic ischemic microvascular disease. No mass, mass effect, shift of midline structures or acute hemorrhage. No evidence of acute infarction. Vascular: No hyperdense vessel or unexpected calcification. Skull: Normal. Negative for fracture or focal lesion. Sinuses/Orbits: No acute finding. Other: None. CT CERVICAL SPINE FINDINGS Alignment: Normal. Skull base and vertebrae: Vertebral body heights are maintained. There is moderate spondylosis throughout the cervical spine to include uncovertebral joint spurring and facet arthropathy. Atlantoaxial articulation is unremarkable. No acute fracture. Mild bilateral neural foraminal narrowing at the C3-4 level. Mild bilateral neural foraminal narrowing from the C4-5 to the C6-7 level. Soft tissues and spinal canal: No prevertebral fluid or swelling. No visible canal hematoma. Disc levels: Moderate disc space narrowing at the C4-5, C5-6 and C6-7 levels. Upper chest: No acute findings. Other: None. IMPRESSION: 1. No acute brain injury. 2. Mild age related atrophic change and mild to moderate chronic ischemic microvascular disease. 3. No acute cervical spine injury. 4. Moderate spondylosis throughout the cervical spine with multilevel disc disease and neural foraminal narrowing as described. Electronically Signed   By: Toribio Agreste M.D.   On: 09/01/2024 18:14   CT Cervical Spine Wo Contrast Result Date: 09/01/2024 CLINICAL DATA:  Fall 4 days ago. EXAM: CT HEAD WITHOUT CONTRAST CT CERVICAL SPINE WITHOUT CONTRAST TECHNIQUE: Multidetector CT imaging of the head and cervical spine was performed following the standard protocol without intravenous contrast. Multiplanar CT image reconstructions of the cervical spine were also generated. RADIATION DOSE REDUCTION: This exam was performed according to the departmental dose-optimization program which includes automated exposure control,  adjustment of the mA and/or kV according to patient size and/or use of iterative reconstruction technique. COMPARISON:  Head CT 04/18/2024 FINDINGS: CT HEAD FINDINGS Brain: Ventricles, cisterns and other CSF spaces are within normal. There is mild age related atrophic change as well as mild to moderate chronic ischemic microvascular disease. No mass, mass effect, shift of midline structures or acute hemorrhage. No evidence of acute infarction. Vascular: No hyperdense vessel or unexpected calcification. Skull: Normal. Negative for fracture or focal lesion. Sinuses/Orbits: No acute finding. Other: None. CT CERVICAL SPINE FINDINGS Alignment: Normal. Skull base and vertebrae: Vertebral body heights are maintained. There is moderate  spondylosis throughout the cervical spine to include uncovertebral joint spurring and facet arthropathy. Atlantoaxial articulation is unremarkable. No acute fracture. Mild bilateral neural foraminal narrowing at the C3-4 level. Mild bilateral neural foraminal narrowing from the C4-5 to the C6-7 level. Soft tissues and spinal canal: No prevertebral fluid or swelling. No visible canal hematoma. Disc levels: Moderate disc space narrowing at the C4-5, C5-6 and C6-7 levels. Upper chest: No acute findings. Other: None. IMPRESSION: 1. No acute brain injury. 2. Mild age related atrophic change and mild to moderate chronic ischemic microvascular disease. 3. No acute cervical spine injury. 4. Moderate spondylosis throughout the cervical spine with multilevel disc disease and neural foraminal narrowing as described. Electronically Signed   By: Toribio Agreste M.D.   On: 09/01/2024 18:14     Procedures   Medications Ordered in the ED - No data to display     Patient presents to the ED for concern of pain secondary to fall, this involves an extensive number of treatment options, and is a complaint that carries with it a high risk of complications and morbidity.  The differential diagnosis  includes traumatic injury, inflammation, fracture, cauda equina syndrome, or radiculopathy.   Co morbidities that complicate the patient evaluation  History of stroke with left-sided deficits.   Additional history obtained:  Additional history obtained from Electronic medical record     External records from outside source obtained and reviewed including medical history and medications   Imaging Studies ordered:  I ordered imaging studies including CT abdomen pelvis, CT head, and CT cervical spine.  I independently visualized and interpreted imaging which showed no acute abnormalities. I agree with the radiologist interpretation    Test Considered:  UA based on CT findings: Patient has no urinary symptoms at this time so UA unwarranted as asymptomatic UTI would not receive treatment.   Problem List / ED Course:  Right hip and leg pain secondary to mechanical fall: Imaging.    Reevaluation:  After the interventions noted above, I reevaluated the patient and found that they have :stayed the same   Dispostion:  After consideration of the diagnostic results and the patients response to treatment, I feel that the patent would benefit from rest at home and OTC Tylenol  for pain management. Close follow up with PCP for re-evaluation.                                   Medical Decision Making Amount and/or Complexity of Data Reviewed Labs: ordered. Radiology: ordered.       Final diagnoses:  None    ED Discharge Orders     None          Rosina Almarie DELENA DEVONNA 09/02/24 1823    Yolande Lamar BROCKS, MD 09/08/24 1056

## 2024-09-01 NOTE — ED Triage Notes (Signed)
 Fell on Sunday morning. Slipped in socks was away from walker. Previous CVA needs to use walker at baseline now. Fell back onto tailbone and struck head on dresser- ASA only. No LOC.

## 2024-09-01 NOTE — ED Notes (Signed)
 Reviewed discharge instructions and home care with pt and daughter. Pt verbalized understanding and had no further questions. Pt exited ED without complications.

## 2024-09-01 NOTE — Discharge Instructions (Signed)
 It was a pleasure meeting with you today. Be sure to rest and be careful with walking around the house while you are still sore. You can take Tylenol  for pain management and alternate warm / ice as needed for comfort. Return if pain worsens or persists. Continue to follow regularly with your PCP.

## 2024-09-01 NOTE — Telephone Encounter (Signed)
 Spoke to patients daughter, Winton Hummer, per signed DPR. They are going to take patient to ED at [facility].

## 2024-09-01 NOTE — Telephone Encounter (Signed)
 FYI Only or Action Required?: FYI only for provider: ED advised.  Patient was last seen in primary care on 06/08/2024 by Gladis Mustard, FNP.  Called Nurse Triage reporting Fall.  Symptoms began several days ago.  Interventions attempted: Nothing.  Symptoms are: unchanged.  Triage Disposition: Go to ED Now (or PCP Triage)  Patient/caregiver understands and will follow disposition?: Yes  Copied from CRM #[id number]. Topic: Clinical - Red Word Triage >> Sep 01, 2024  1:38 PM Mercer PEDLAR wrote: Red Word that prompted transfer to Nurse Triage: Kara Dean (daughter) stated that patient had a fall on 08/29/24 and is complaining of pain when sitting down. Reason for Disposition  [1] Unable to get up until help (e.g., caregiver, family, friend) arrived AND [2] on the ground 1 hour or more  Answer Assessment - Initial Assessment Questions Advised ED today.  Pt and pt's daughter reports:  1. MECHANISM: How did the fall happen?     Slippery socks, trying to get into bed, slipped and fell, bottom first  3. ONSET: When did the fall happen? (e.g., minutes, hours, or days ago)     Sunday, 08/29/24 4. LOCATION: What part of the body hit the ground? (e.g., back, buttocks, head, hips, knees, hands, head, stomach)     C/o pain in butt, trying to stand up, can't sit down for long periods of times 5. INJURY: Did you hurt (injure) yourself when you fell? If Yes, ask: What did you injure? Tell me more about this? (e.g., body area; type of injury; pain severity)    Back of head and fell on butt 6. PAIN: Is there any pain? If Yes, ask: How bad is the pain? (e.g., Scale 0-10; or none, mild,      8/10; bottom to right leg 7. SIZE: For cuts, bruises, or swelling, ask: How large is it? (e.g., inches or centimeters)      denies 9. OTHER SYMPTOMS: Do you have any other symptoms? (e.g., dizziness, fever, weakness; new-onset or worsening).      Denies problems b/b, dizziness,  weakness Takes Aspirin   Protocols used: Falls and [facility]

## 2024-09-10 ENCOUNTER — Encounter

## 2024-09-10 ENCOUNTER — Ambulatory Visit: Payer: Self-pay | Admitting: Nurse Practitioner

## 2024-09-10 ENCOUNTER — Encounter: Payer: Self-pay | Admitting: Nurse Practitioner

## 2024-09-10 VITALS — BP 143/49 | HR 53 | Temp 98.2°F | Ht 61.0 in | Wt 118.0 lb

## 2024-09-10 DIAGNOSIS — K219 Gastro-esophageal reflux disease without esophagitis: Secondary | ICD-10-CM | POA: Diagnosis not present

## 2024-09-10 DIAGNOSIS — Z794 Long term (current) use of insulin: Secondary | ICD-10-CM

## 2024-09-10 DIAGNOSIS — E785 Hyperlipidemia, unspecified: Secondary | ICD-10-CM

## 2024-09-10 DIAGNOSIS — Z8673 Personal history of transient ischemic attack (TIA), and cerebral infarction without residual deficits: Secondary | ICD-10-CM

## 2024-09-10 DIAGNOSIS — F3342 Major depressive disorder, recurrent, in full remission: Secondary | ICD-10-CM

## 2024-09-10 DIAGNOSIS — I10 Essential (primary) hypertension: Secondary | ICD-10-CM | POA: Diagnosis not present

## 2024-09-10 DIAGNOSIS — N1832 Chronic kidney disease, stage 3b: Secondary | ICD-10-CM

## 2024-09-10 DIAGNOSIS — E1142 Type 2 diabetes mellitus with diabetic polyneuropathy: Secondary | ICD-10-CM

## 2024-09-10 DIAGNOSIS — E1169 Type 2 diabetes mellitus with other specified complication: Secondary | ICD-10-CM | POA: Diagnosis not present

## 2024-09-10 DIAGNOSIS — R600 Localized edema: Secondary | ICD-10-CM

## 2024-09-10 DIAGNOSIS — F411 Generalized anxiety disorder: Secondary | ICD-10-CM

## 2024-09-10 LAB — LIPID PANEL

## 2024-09-10 LAB — BAYER DCA HB A1C WAIVED: HB A1C (BAYER DCA - WAIVED): 7.2 % — ABNORMAL HIGH (ref 4.8–5.6)

## 2024-09-10 MED ORDER — AMLODIPINE BESYLATE 10 MG PO TABS: 10.0000 mg | ORAL_TABLET | Freq: Every day | ORAL | 1 refills | Status: AC

## 2024-09-10 MED ORDER — OLMESARTAN MEDOXOMIL 40 MG PO TABS: 40.0000 mg | ORAL_TABLET | Freq: Every day | ORAL | 1 refills | Status: AC

## 2024-09-10 MED ORDER — TOLTERODINE TARTRATE ER 4 MG PO CP24: 4.0000 mg | ORAL_CAPSULE | Freq: Every day | ORAL | 1 refills | Status: AC

## 2024-09-10 MED ORDER — LORAZEPAM 0.5 MG PO TABS: 0.5000 mg | ORAL_TABLET | Freq: Two times a day (BID) | ORAL | 5 refills | Status: AC

## 2024-09-10 MED ORDER — ATORVASTATIN CALCIUM 20 MG PO TABS: 20.0000 mg | ORAL_TABLET | Freq: Every day | ORAL | 1 refills | Status: DC

## 2024-09-10 MED ORDER — PANTOPRAZOLE SODIUM 40 MG PO TBEC: 40.0000 mg | DELAYED_RELEASE_TABLET | Freq: Every day | ORAL | 1 refills | Status: AC

## 2024-09-10 MED ORDER — POTASSIUM CHLORIDE ER 10 MEQ PO TBCR: 10.0000 meq | EXTENDED_RELEASE_TABLET | Freq: Every day | ORAL | 1 refills | Status: AC

## 2024-09-10 MED ORDER — METOPROLOL SUCCINATE ER 25 MG PO TB24: 25.0000 mg | ORAL_TABLET | Freq: Every day | ORAL | 1 refills | Status: AC

## 2024-09-10 MED ORDER — CITALOPRAM HYDROBROMIDE 20 MG PO TABS: 20.0000 mg | ORAL_TABLET | Freq: Every day | ORAL | 1 refills | Status: AC

## 2024-09-10 MED ORDER — FUROSEMIDE 20 MG PO TABS: 20.0000 mg | ORAL_TABLET | Freq: Every day | ORAL | 1 refills | Status: AC | PRN

## 2024-09-10 NOTE — Progress Notes (Signed)
 Subjective:    Patient ID: Kara Dean, female    DOB: Mar 31, 1933, 89 y.o.   MRN: [id number]   Chief Complaint: medical management of chronic issues    HPI:  Kara Dean is a 89 y.o. who identifies as a female who was assigned female at birth.   Social history: Lives with: granddaughter Work history: retired   Water Engineer in today for follow up of the following chronic medical issues:  1. Essential hypertension No c/o chest pain, sob and headache. Does not check blood pressure at home. BP Readings from Last 3 Encounters:  09/01/24 113/63  06/08/24 (!) 154/62  04/27/24 (!) 136/49     2. Hyperlipidemia associated with type 2 diabetes mellitus (HCC) Does not watch diet very closely. Has a poor appetite. Lab Results  Component Value Date   CHOL 112 06/08/2024   HDL 41 06/08/2024   LDLCALC 45 06/08/2024   TRIG 154 (H) 06/08/2024   CHOLHDL 2.7 06/08/2024     3. Diabetes mellitus with insulin  therapy (HCC) Doe snot check blood sugars very often at home. Runs 80-120 most days. Some over 200.  Lab Results  Component Value Date   HGBA1C 7.6 (H) 06/08/2024      4. Gastroesophageal reflux disease without esophagitis Takes protonix  daily which works well for her.  5. Chronic kidney disease (CKD) stage G3a/A3, moderately decreased glomerular filtration rate (GFR) between 45-59 mL/min/1.73 square meter and albuminuria creatinine ratio greater than 300 mg/g (HCC) No voiding issue Lab Results  Component Value Date   CREATININE 1.30 (H) 06/08/2024     6. History  of cerebrovascular accident (CVA) No permanent effects  7. Peripheral edema Has occasionally  8. Recurrent major depressive disorder, in full remission (HCC) 9. GAD (generalized anxiety disorder) Is on celexa  daily and is doing well.      09/10/2024   11:47 AM 06/08/2024   10:56 AM 04/27/2024   12:03 PM 03/08/2024   11:32 AM  GAD 7 : Generalized Anxiety Score  Nervous, Anxious, on Edge 1 1 0 1   Control/stop worrying 2 1 1 1   Worry too much - different things 2 1 1 1   Trouble relaxing 3 0 0 0  Restless 2 0 0 0  Easily annoyed or irritable 1 0 0 0  Afraid - awful might happen 1 1 0 1  Total GAD 7 Score 12 4 2 4   Anxiety Difficulty Somewhat difficult  Somewhat difficult Not difficult at all         New complaints: Patient feel 2 weeks ago and cracked her tail bone. Doing well though. Sits on donut to relieve pressure  Allergies  Allergen Reactions   Codeine Other (See Comments)    Unknown    Morphine Other (See Comments)    Unknown    Niacin Other (See Comments)    Unknown    Latex Rash   Sulfa Antibiotics Rash   Sulfonamide Derivatives Rash   Outpatient Encounter Medications as of 09/10/2024  Medication Sig   amLODipine  (NORVASC ) 10 MG tablet Take 1 tablet (10 mg total) by mouth daily.   aspirin  81 MG chewable tablet Chew 1 tablet (81 mg total) by mouth daily.   atorvastatin  (LIPITOR) 20 MG tablet Take 1 tablet (20 mg total) by mouth daily.   Blood Glucose Monitoring Suppl DEVI Check BGs 3 times daily. E11.9 May substitute to any manufacturer covered by patient's insurance.   citalopram  (CELEXA ) 20 MG tablet Take 1 tablet (20 mg  total) by mouth daily.   fluticasone  (FLONASE ) 50 MCG/ACT nasal spray USE 2 SPRAYS IN EACH NOSTRIL ONCE DAILY   furosemide  (LASIX ) 20 MG tablet Take 1 tablet (20 mg total) by mouth daily as needed for fluid or edema.   Glucose Blood (BLOOD GLUCOSE TEST STRIPS) STRP Check BGs 3 times daily. E11.9 May substitute to any manufacturer covered by patient's insurance.   Insulin  Pen Needle (PIP PEN NEEDLES 32G X ) 32G X 4 MM MISC UAD   Lancet Device MISC Check BGs 3 times daily. E11.9 May substitute to any manufacturer covered by patient's insurance.   Lancets Misc. MISC Check BGs 3 times daily. E11.9 May substitute to any manufacturer covered by patient's insurance.   LANTUS  SOLOSTAR 100 UNIT/ML Solostar Pen Inject 48 Units into the skin daily.    LORazepam  (ATIVAN ) 0.5 MG tablet Take 1 tablet (0.5 mg total) by mouth 2 (two) times daily.   metoprolol  succinate (TOPROL -XL) 25 MG 24 hr tablet Take 1 tablet (25 mg total) by mouth daily.   Multiple Vitamin (MULTIVITAMIN) tablet Take 1 tablet by mouth daily.   olmesartan  (BENICAR ) 40 MG tablet Take 1 tablet (40 mg total) by mouth daily.   pantoprazole  (PROTONIX ) 40 MG tablet Take 1 tablet (40 mg total) by mouth daily.   potassium chloride  (KLOR-CON ) 10 MEQ tablet Take 1 tablet (10 mEq total) by mouth daily.   tolterodine  (DETROL  LA) 4 MG 24 hr capsule Take 1 capsule (4 mg total) by mouth daily.   No facility-administered encounter medications on file as of 09/10/2024.    Past Surgical History:  Procedure Laterality Date   ABDOMINAL HYSTERECTOMY     BACK SURGERY  11/04/1992   CATARACT EXTRACTION W/PHACO Left 09/19/2017   Procedure: CATARACT EXTRACTION PHACO AND INTRAOCULAR LENS PLACEMENT (IOC);  Surgeon: Harrie Agent, MD;  Location: [facility];  Service: Ophthalmology;  Laterality: Left;  CDE: 8.10   CATARACT EXTRACTION W/PHACO Right 10/10/2017   Procedure: CATARACT EXTRACTION PHACO AND INTRAOCULAR LENS PLACEMENT RIGHT EYE;  Surgeon: Harrie Agent, MD;  Location: [facility];  Service: Ophthalmology;  Laterality: Right;  CDE: 9.60   CHOLECYSTECTOMY N/A 05/10/2021   Procedure: LAPAROSCOPIC CHOLECYSTECTOMY;  Surgeon: Signe Mitzie LABOR, MD;  Location: [facility];  Service: General;  Laterality: N/A;   COLON RESECTION  07/05/1989   LOOP RECORDER IMPLANT     [id number] LINQ II   TUBAL LIGATION     VESICOVAGINAL FISTULA CLOSURE W/ TAH  11/04/1982    Family History  Problem Relation Age of Onset   Coronary artery disease Sister        stent x 3   Seizures Sister    Coronary artery disease Brother        stent   Hypertension Mother    Stroke Mother    Emphysema Father    Alcohol abuse Brother    Lung disease Brother    Heart attack Daughter       Controlled substance contract:  12/11/22     Review of Systems  Constitutional:  Negative for diaphoresis.  Eyes:  Negative for pain.  Respiratory:  Negative for shortness of breath.   Cardiovascular:  Negative for chest pain, palpitations and leg swelling.  Gastrointestinal:  Negative for abdominal pain.  Endocrine: Negative for polydipsia.  Skin:  Negative for rash.  Neurological:  Negative for dizziness, weakness and headaches.  Hematological:  Does not bruise/bleed easily.  All other systems reviewed and are negative.      Objective:  Physical Exam Vitals and nursing note reviewed.  Constitutional:      General: She is not in acute distress.    Appearance: Normal appearance. She is well-developed.  HENT:     Head: Normocephalic.     Right Ear: Tympanic membrane normal.     Left Ear: Tympanic membrane normal.     Nose: Nose normal.     Mouth/Throat:     Mouth: Mucous membranes are moist.  Eyes:     Pupils: Pupils are equal, round, and reactive to light.  Neck:     Vascular: No carotid bruit or JVD.  Cardiovascular:     Rate and Rhythm: Normal rate and regular rhythm.     Heart sounds: Normal heart sounds.  Pulmonary:     Effort: Pulmonary effort is normal. No respiratory distress.     Breath sounds: Normal breath sounds. No wheezing or rales.  Chest:     Chest wall: No tenderness.  Abdominal:     General: Bowel sounds are normal. There is no distension or abdominal bruit.     Palpations: Abdomen is soft. There is no hepatomegaly, splenomegaly, mass or pulsatile mass.     Tenderness: There is no abdominal tenderness.  Musculoskeletal:        General: Normal range of motion.     Cervical back: Normal range of motion and neck supple.     Comments: Walking with walker  Lymphadenopathy:     Cervical: No cervical adenopathy.  Skin:    General: Skin is warm and dry.  Neurological:     Mental Status: She is alert and oriented to person, place, and time.     Deep Tendon Reflexes: Reflexes are  normal and symmetric.  Psychiatric:        Behavior: Behavior normal.        Thought Content: Thought content normal.        Judgment: Judgment normal.    BP (!) 143/49   Pulse (!) 53   Temp 98.2 F (36.8 C) (Temporal)   Ht 5' 1 (1.549 m)   Wt 118 lb (53.5 kg)   BMI 22.30 kg/m     HGBA1c 7.0%     Assessment & Plan:   Kara Dean comes in today with chief complaint of medical management of chronic issues    Diagnosis and orders addressed:  1. Essential hypertension Low sodium diet - CBC with Differential/Platelet - CMP14+EGFR - amLODipine  (NORVASC ) 10 MG tablet; Take 1 tablet (10 mg total) by mouth daily.  Dispense: 90 tablet; Refill: 1 - metoprolol  succinate (TOPROL -XL) 25 MG 24 hr tablet; Take 1 tablet (25 mg total) by mouth daily.  Dispense: 90 tablet; Refill: 1 - olmesartan  (BENICAR ) 40 MG tablet; Take 1 tablet (40 mg total) by mouth daily.  Dispense: 90 tablet; Refill: 1 - potassium chloride  (KLOR-CON ) 10 MEQ tablet; Take 1 tablet (10 mEq total) by mouth as directed. Mon, Wed, and Fri only.  2. Hyperlipidemia associated with type 2 diabetes mellitus (HCC) Low fat diet - Lipid panel - atorvastatin  (LIPITOR) 10 MG tablet; Take 1 tablet (10 mg total) by mouth daily. (NEEDS TO BE SEEN BEFORE NEXT REFILL)  Dispense: 90 tablet; Refill: 1  3. Diabetes mellitus with insulin  therapy (HCC) Continue to watch carbs in diet - Bayer DCA Hb A1c Waived - LANTUS  SOLOSTAR 100 UNIT/ML Solostar Pen; Inject 40 Units into the skin daily.  Dispense: 15 mL; Refill: 5  4. Gastroesophageal reflux disease without esophagitis Avoid spicy foods  Do not eat 2 hours prior to bedtime  - pantoprazole  (PROTONIX ) 40 MG tablet; Take 1 tablet (40 mg total) by mouth daily. (NEEDS TO BE SEEN BEFORE NEXT REFILL)  Dispense: 90 tablet; Refill: 1  5. Chronic kidney disease (CKD) stage G3a/A3, moderately decreased glomerular filtration rate (GFR) between 45-59 mL/min/1.73 square meter and albuminuria  creatinine ratio greater than 300 mg/g (HCC) Labs pending  6. Late effect of cerebrovascular accident (CVA) Report any weakness - dipyridamole -aspirin  (AGGRENOX ) 200-25 MG 12hr capsule; Take 1 capsule by mouth daily.  Dispense: 90 capsule; Refill: 1  7. Peripheral edema Elevtae legs when sitting - furosemide  (LASIX ) 20 MG tablet; Take 1 tablet (20 mg total) by mouth daily.  Dispense: 90 tablet; Refill: 1  8. Recurrent major depressive disorder, in full remission (HCC) Stress management  9. GAD (generalized anxiety disorder) - citalopram  (CELEXA ) 20 MG tablet; Take 1 tablet (20 mg total) by mouth daily.  Dispense: 90 tablet; Refill: 1 - LORazepam  (ATIVAN ) 0.5 MG tablet; Take 1 tablet (0.5 mg total) by mouth 2 (two) times daily.  Dispense: 60 tablet; Refill: 5  10. Unsteady gait Order to continue pt at home Fall precautions  Labs pending Health Maintenance reviewed Diet and exercise encouraged  Follow up plan: 6 month   Mary-Margaret Gladis, FNP

## 2024-09-10 NOTE — Patient Instructions (Signed)
 Fall Prevention in the Home, Adult Falls can cause injuries and can happen to people of all ages. There are many things you can do to make your home safer and to help prevent falls. What actions can I take to prevent falls? General information Use good lighting in all rooms. Make sure to: Replace any light bulbs that burn out. Turn on the lights in dark areas and use night-lights. Keep items that you use often in easy-to-reach places. Lower the shelves around your home if needed. Move furniture so that there are clear paths around it. Do not use throw rugs or other things on the floor that can make you trip. If any of your floors are uneven, fix them. Add color or contrast paint or tape to clearly mark and help you see: Grab bars or handrails. First and last steps of staircases. Where the edge of each step is. If you use a ladder or stepladder: Make sure that it is fully opened. Do not climb a closed ladder. Make sure the sides of the ladder are locked in place. Have someone hold the ladder while you use it. Know where your pets are as you move through your home. What can I do in the bathroom?     Keep the floor dry. Clean up any water on the floor right away. Remove soap buildup in the bathtub or shower. Buildup makes bathtubs and showers slippery. Use non-skid mats or decals on the floor of the bathtub or shower. Attach bath mats securely with double-sided, non-slip rug tape. If you need to sit down in the shower, use a non-slip stool. Install grab bars by the toilet and in the bathtub and shower. Do not use towel bars as grab bars. What can I do in the bedroom? Make sure that you have a light by your bed that is easy to reach. Do not use any sheets or blankets on your bed that hang to the floor. Have a firm chair or bench with side arms that you can use for support when you get dressed. What can I do in the kitchen? Clean up any spills right away. If you need to reach something  above you, use a step stool with a grab bar. Keep electrical cords out of the way. Do not use floor polish or wax that makes floors slippery. What can I do with my stairs? Do not leave anything on the stairs. Make sure that you have a light switch at the top and the bottom of the stairs. Make sure that there are handrails on both sides of the stairs. Fix handrails that are broken or loose. Install non-slip stair treads on all your stairs if they do not have carpet. Avoid having throw rugs at the top or bottom of the stairs. Choose a carpet that does not hide the edge of the steps on the stairs. Make sure that the carpet is firmly attached to the stairs. Fix carpet that is loose or worn. What can I do on the outside of my home? Use bright outdoor lighting. Fix the edges of walkways and driveways and fix any cracks. Clear paths of anything that can make you trip, such as tools or rocks. Add color or contrast paint or tape to clearly mark and help you see anything that might make you trip as you walk through a door, such as a raised step or threshold. Trim any bushes or trees on paths to your home. Check to see if handrails are loose  or broken and that both sides of all steps have handrails. Install guardrails along the edges of any raised decks and porches. Have leaves, snow, or ice cleared regularly. Use sand, salt, or ice melter on paths if you live where there is ice and snow during the winter. Clean up any spills in your garage right away. This includes grease or oil spills. What other actions can I take? Review your medicines with your doctor. Some medicines can cause dizziness or changes in blood pressure, which increase your risk of falling. Wear shoes that: Have a low heel. Do not wear high heels. Have rubber bottoms and are closed at the toe. Feel good on your feet and fit well. Use tools that help you move around if needed. These include: Canes. Walkers. Scooters. Crutches. Ask  your doctor what else you can do to help prevent falls. This may include seeing a physical therapist to learn to do exercises to move better and get stronger. Where to find more information Centers for Disease Control and Prevention, [employer]: TonerPromos.no [employer] on Aging: BaseRingTones.pl National Institute on Aging: BaseRingTones.pl Contact a doctor if: You are afraid of falling at home. You feel weak, drowsy, or dizzy at home. You fall at home. Get help right away if you: Lose consciousness or have trouble moving after a fall. Have a fall that causes a head injury. These symptoms may be an emergency. Get help right away. Call 911. Do not wait to see if the symptoms will go away. Do not drive yourself to the hospital. This information is not intended to replace advice given to you by your health care provider. Make sure you discuss any questions you have with your health care provider. Document Revised: 06/24/2022 Document Reviewed: 06/24/2022 Elsevier Patient Education  2024 [employer].

## 2024-09-11 LAB — CBC WITH DIFFERENTIAL/PLATELET
Basophils Absolute: 0.1 x10E3/uL (ref 0.0–0.2)
Basos: 1 %
EOS (ABSOLUTE): 0.4 x10E3/uL (ref 0.0–0.4)
Eos: 4 %
Hematocrit: 40.8 % (ref 34.0–46.6)
Hemoglobin: 13 g/dL (ref 11.1–15.9)
Immature Grans (Abs): 0.1 x10E3/uL (ref 0.0–0.1)
Immature Granulocytes: 1 %
Lymphocytes Absolute: 1.2 x10E3/uL (ref 0.7–3.1)
Lymphs: 13 %
MCH: 30.2 pg (ref 26.6–33.0)
MCHC: 31.9 g/dL (ref 31.5–35.7)
MCV: 95 fL (ref 79–97)
Monocytes Absolute: 0.6 x10E3/uL (ref 0.1–0.9)
Monocytes: 7 %
Neutrophils Absolute: 7.1 x10E3/uL — ABNORMAL HIGH (ref 1.4–7.0)
Neutrophils: 74 %
Platelets: 206 x10E3/uL (ref 150–450)
RBC: 4.31 x10E6/uL (ref 3.77–5.28)
RDW: 12.7 % (ref 11.7–15.4)
WBC: 9.5 x10E3/uL (ref 3.4–10.8)

## 2024-09-11 LAB — LIPID PANEL
Chol/HDL Ratio: 2.8 ratio (ref 0.0–4.4)
Cholesterol, Total: 110 mg/dL (ref 100–199)
HDL: 39 mg/dL — ABNORMAL LOW (ref >39)
LDL Chol Calc (NIH): 44 mg/dL (ref 0–99)
Triglycerides: 162 mg/dL — ABNORMAL HIGH (ref 0–149)
VLDL Cholesterol Cal: 27 mg/dL (ref 5–40)

## 2024-09-11 LAB — CMP14+EGFR
ALT: 9 IU/L (ref 0–32)
AST: 14 IU/L (ref 0–40)
Albumin: 4.4 g/dL (ref 3.7–4.7)
Alkaline Phosphatase: 77 IU/L (ref 48–129)
BUN/Creatinine Ratio: 15 (ref 12–28)
BUN: 21 mg/dL (ref 8–27)
Bilirubin Total: 0.6 mg/dL (ref 0.0–1.2)
CO2: 25 mmol/L (ref 20–29)
Calcium: 10.1 mg/dL (ref 8.7–10.3)
Chloride: 100 mmol/L (ref 96–106)
Creatinine, Ser: 1.4 mg/dL — ABNORMAL HIGH (ref 0.57–1.00)
Globulin, Total: 2.6 g/dL (ref 1.5–4.5)
Glucose: 235 mg/dL — ABNORMAL HIGH (ref 70–99)
Potassium: 4.4 mmol/L (ref 3.5–5.2)
Sodium: 140 mmol/L (ref 134–144)
Total Protein: 7 g/dL (ref 6.0–8.5)
eGFR: 36 mL/min/1.73 — ABNORMAL LOW (ref >59)

## 2024-09-13 ENCOUNTER — Ambulatory Visit: Payer: Self-pay | Admitting: Nurse Practitioner

## 2024-09-14 ENCOUNTER — Other Ambulatory Visit: Payer: Self-pay | Admitting: Nurse Practitioner

## 2024-09-14 DIAGNOSIS — F411 Generalized anxiety disorder: Secondary | ICD-10-CM

## 2024-09-21 ENCOUNTER — Other Ambulatory Visit: Payer: Self-pay | Admitting: *Deleted

## 2024-09-23 ENCOUNTER — Encounter

## 2024-09-23 ENCOUNTER — Ambulatory Visit: Attending: Cardiovascular Disease

## 2024-09-23 DIAGNOSIS — I639 Cerebral infarction, unspecified: Secondary | ICD-10-CM | POA: Diagnosis not present

## 2024-09-23 LAB — CUP PACEART REMOTE DEVICE CHECK
Date Time Interrogation Session: 20251119233748
Implantable Pulse Generator Implant Date: 20250411

## 2024-09-27 NOTE — Progress Notes (Signed)
 Remote Loop Recorder Transmission

## 2024-10-04 ENCOUNTER — Ambulatory Visit: Payer: Self-pay | Admitting: Cardiovascular Disease

## 2024-10-11 ENCOUNTER — Ambulatory Visit: Admitting: Neurology

## 2024-10-15 ENCOUNTER — Encounter

## 2024-10-24 ENCOUNTER — Ambulatory Visit

## 2024-10-24 DIAGNOSIS — I639 Cerebral infarction, unspecified: Secondary | ICD-10-CM

## 2024-10-25 ENCOUNTER — Encounter

## 2024-10-26 LAB — CUP PACEART REMOTE DEVICE CHECK
Date Time Interrogation Session: 20251220233157
Implantable Pulse Generator Implant Date: 20250411

## 2024-10-27 NOTE — Progress Notes (Signed)
 Remote Loop Recorder Transmission

## 2024-11-03 ENCOUNTER — Ambulatory Visit: Payer: Self-pay | Admitting: Cardiovascular Disease

## 2024-11-19 ENCOUNTER — Encounter

## 2024-11-24 ENCOUNTER — Ambulatory Visit

## 2024-11-24 DIAGNOSIS — I639 Cerebral infarction, unspecified: Secondary | ICD-10-CM | POA: Diagnosis not present

## 2024-11-25 LAB — CUP PACEART REMOTE DEVICE CHECK
Date Time Interrogation Session: 20260120233641
Implantable Pulse Generator Implant Date: 20250411

## 2024-11-27 NOTE — Progress Notes (Signed)
 Remote Loop Recorder Transmission

## 2024-11-30 ENCOUNTER — Ambulatory Visit: Payer: Self-pay | Admitting: Cardiovascular Disease

## 2024-12-24 ENCOUNTER — Ambulatory Visit: Admitting: Adult Health

## 2024-12-25 ENCOUNTER — Ambulatory Visit

## 2025-03-08 ENCOUNTER — Ambulatory Visit: Admitting: Nurse Practitioner
